# Patient Record
Sex: Female | Born: 1935 | Race: White | Hispanic: No | Smoking: Never smoker
Health system: Southern US, Community
[De-identification: ages and names within clinical notes are randomized; demographics above are authoritative.]

## PROBLEM LIST (undated history)

## (undated) DIAGNOSIS — I10 Essential (primary) hypertension: Secondary | ICD-10-CM

## (undated) DIAGNOSIS — H409 Unspecified glaucoma: Secondary | ICD-10-CM

## (undated) DIAGNOSIS — I6522 Occlusion and stenosis of left carotid artery: Secondary | ICD-10-CM

## (undated) DIAGNOSIS — M1990 Unspecified osteoarthritis, unspecified site: Secondary | ICD-10-CM

## (undated) DIAGNOSIS — I2510 Atherosclerotic heart disease of native coronary artery without angina pectoris: Secondary | ICD-10-CM

## (undated) DIAGNOSIS — I38 Endocarditis, valve unspecified: Secondary | ICD-10-CM

## (undated) DIAGNOSIS — E785 Hyperlipidemia, unspecified: Secondary | ICD-10-CM

## (undated) DIAGNOSIS — M79606 Pain in leg, unspecified: Secondary | ICD-10-CM

## (undated) HISTORY — DX: Unspecified glaucoma: H40.9

## (undated) HISTORY — DX: Endocarditis, valve unspecified: I38

## (undated) HISTORY — DX: Hyperlipidemia, unspecified: E78.5

## (undated) HISTORY — PX: OTHER SURGICAL HISTORY: SHX169

## (undated) HISTORY — DX: Pain in leg, unspecified: M79.606

## (undated) HISTORY — DX: Essential (primary) hypertension: I10

## (undated) HISTORY — PX: ANOMALOUS PULMONARY VENOUS RETURN REPAIR, TOTAL: SHX1156

## (undated) HISTORY — DX: Occlusion and stenosis of left carotid artery: I65.22

## (undated) HISTORY — PX: APPENDECTOMY: SHX54

## (undated) HISTORY — PX: FOOT SURGERY: SHX648

## (undated) HISTORY — DX: Unspecified osteoarthritis, unspecified site: M19.90

## (undated) HISTORY — DX: Atherosclerotic heart disease of native coronary artery without angina pectoris: I25.10

---

## 1999-03-01 ENCOUNTER — Encounter: Admission: RE | Admit: 1999-03-01 | Discharge: 1999-04-16 | Payer: Self-pay | Admitting: Neurosurgery

## 1999-05-23 ENCOUNTER — Other Ambulatory Visit: Admission: RE | Admit: 1999-05-23 | Discharge: 1999-05-23 | Payer: Self-pay | Admitting: Internal Medicine

## 2000-05-16 ENCOUNTER — Encounter: Payer: Self-pay | Admitting: Internal Medicine

## 2000-07-22 ENCOUNTER — Encounter: Admission: RE | Admit: 2000-07-22 | Discharge: 2000-09-22 | Payer: Self-pay | Admitting: Orthopedic Surgery

## 2004-02-07 ENCOUNTER — Ambulatory Visit: Payer: Self-pay | Admitting: Internal Medicine

## 2004-03-15 ENCOUNTER — Ambulatory Visit: Payer: Self-pay | Admitting: Internal Medicine

## 2004-04-06 ENCOUNTER — Ambulatory Visit: Payer: Self-pay | Admitting: Internal Medicine

## 2004-06-27 ENCOUNTER — Ambulatory Visit: Payer: Self-pay | Admitting: Internal Medicine

## 2004-07-31 ENCOUNTER — Ambulatory Visit: Payer: Self-pay | Admitting: Internal Medicine

## 2004-11-06 ENCOUNTER — Ambulatory Visit: Payer: Self-pay | Admitting: Internal Medicine

## 2004-11-09 ENCOUNTER — Ambulatory Visit: Payer: Self-pay | Admitting: Cardiology

## 2004-11-15 ENCOUNTER — Encounter: Admission: RE | Admit: 2004-11-15 | Discharge: 2004-11-15 | Payer: Self-pay | Admitting: Internal Medicine

## 2005-01-16 ENCOUNTER — Ambulatory Visit: Payer: Self-pay | Admitting: Internal Medicine

## 2005-04-19 ENCOUNTER — Ambulatory Visit: Payer: Self-pay | Admitting: Internal Medicine

## 2005-06-12 ENCOUNTER — Ambulatory Visit: Payer: Self-pay | Admitting: Internal Medicine

## 2005-07-15 ENCOUNTER — Ambulatory Visit: Payer: Self-pay | Admitting: Internal Medicine

## 2005-10-11 ENCOUNTER — Ambulatory Visit: Payer: Self-pay | Admitting: Internal Medicine

## 2005-12-23 ENCOUNTER — Ambulatory Visit: Payer: Self-pay | Admitting: Internal Medicine

## 2005-12-26 ENCOUNTER — Ambulatory Visit: Payer: Self-pay

## 2005-12-31 ENCOUNTER — Ambulatory Visit: Payer: Self-pay | Admitting: Internal Medicine

## 2006-01-31 ENCOUNTER — Ambulatory Visit: Payer: Self-pay | Admitting: Internal Medicine

## 2006-03-07 ENCOUNTER — Ambulatory Visit: Payer: Self-pay | Admitting: Internal Medicine

## 2006-04-18 ENCOUNTER — Ambulatory Visit: Payer: Self-pay | Admitting: Internal Medicine

## 2006-06-17 ENCOUNTER — Ambulatory Visit: Payer: Self-pay | Admitting: Internal Medicine

## 2006-06-18 LAB — CONVERTED CEMR LAB
BUN: 30 mg/dL — ABNORMAL HIGH (ref 6–23)
CO2: 30 meq/L (ref 19–32)
Calcium: 9.3 mg/dL (ref 8.4–10.5)
Chloride: 108 meq/L (ref 96–112)
Creatinine, Ser: 1.3 mg/dL — ABNORMAL HIGH (ref 0.4–1.2)
GFR calc Af Amer: 51 mL/min
GFR calc non Af Amer: 42 mL/min
Glucose, Bld: 115 mg/dL — ABNORMAL HIGH (ref 70–99)
Hgb A1c MFr Bld: 6.2 % — ABNORMAL HIGH (ref 4.6–6.0)
Potassium: 4.2 meq/L (ref 3.5–5.1)
Sodium: 144 meq/L (ref 135–145)

## 2006-08-21 ENCOUNTER — Ambulatory Visit: Payer: Self-pay | Admitting: *Deleted

## 2006-09-12 ENCOUNTER — Ambulatory Visit: Payer: Self-pay | Admitting: Internal Medicine

## 2006-10-02 DIAGNOSIS — M810 Age-related osteoporosis without current pathological fracture: Secondary | ICD-10-CM | POA: Insufficient documentation

## 2006-10-02 DIAGNOSIS — E785 Hyperlipidemia, unspecified: Secondary | ICD-10-CM | POA: Insufficient documentation

## 2006-10-02 DIAGNOSIS — I10 Essential (primary) hypertension: Secondary | ICD-10-CM | POA: Insufficient documentation

## 2006-10-24 ENCOUNTER — Ambulatory Visit: Payer: Self-pay | Admitting: Internal Medicine

## 2006-10-24 DIAGNOSIS — E1169 Type 2 diabetes mellitus with other specified complication: Secondary | ICD-10-CM | POA: Insufficient documentation

## 2006-10-24 DIAGNOSIS — E1129 Type 2 diabetes mellitus with other diabetic kidney complication: Secondary | ICD-10-CM | POA: Insufficient documentation

## 2006-10-24 LAB — CONVERTED CEMR LAB
BUN: 30 mg/dL — ABNORMAL HIGH (ref 6–23)
Calcium: 9.7 mg/dL (ref 8.4–10.5)
Creatinine, Ser: 1.2 mg/dL (ref 0.4–1.2)
GFR calc Af Amer: 55 mL/min
Hgb A1c MFr Bld: 6.2 % — ABNORMAL HIGH (ref 4.6–6.0)
Potassium: 5.2 meq/L — ABNORMAL HIGH (ref 3.5–5.1)

## 2006-10-30 ENCOUNTER — Telehealth: Payer: Self-pay | Admitting: *Deleted

## 2006-10-30 ENCOUNTER — Ambulatory Visit: Payer: Self-pay | Admitting: Internal Medicine

## 2006-10-30 LAB — CONVERTED CEMR LAB: Potassium: 5.8 meq/L — ABNORMAL HIGH (ref 3.5–5.1)

## 2006-11-05 ENCOUNTER — Telehealth: Payer: Self-pay | Admitting: *Deleted

## 2006-12-03 ENCOUNTER — Encounter: Payer: Self-pay | Admitting: Internal Medicine

## 2006-12-29 ENCOUNTER — Ambulatory Visit: Payer: Self-pay | Admitting: Internal Medicine

## 2006-12-29 ENCOUNTER — Telehealth: Payer: Self-pay | Admitting: Internal Medicine

## 2006-12-31 ENCOUNTER — Telehealth (INDEPENDENT_AMBULATORY_CARE_PROVIDER_SITE_OTHER): Payer: Self-pay | Admitting: *Deleted

## 2007-01-01 ENCOUNTER — Ambulatory Visit: Payer: Self-pay | Admitting: Internal Medicine

## 2007-01-01 DIAGNOSIS — M12559 Traumatic arthropathy, unspecified hip: Secondary | ICD-10-CM | POA: Insufficient documentation

## 2007-01-26 ENCOUNTER — Ambulatory Visit: Payer: Self-pay | Admitting: Internal Medicine

## 2007-01-26 LAB — CONVERTED CEMR LAB: Cholesterol: 212 mg/dL (ref 0–200)

## 2007-02-02 ENCOUNTER — Ambulatory Visit: Payer: Self-pay | Admitting: Internal Medicine

## 2007-02-02 DIAGNOSIS — M1990 Unspecified osteoarthritis, unspecified site: Secondary | ICD-10-CM | POA: Insufficient documentation

## 2007-02-02 LAB — CONVERTED CEMR LAB
Cholesterol, target level: 200 mg/dL
Hgb A1c MFr Bld: 6.2 % — ABNORMAL HIGH (ref 4.6–6.0)
LDL Goal: 100 mg/dL

## 2007-02-09 ENCOUNTER — Telehealth: Payer: Self-pay | Admitting: Internal Medicine

## 2007-02-12 ENCOUNTER — Ambulatory Visit: Payer: Self-pay | Admitting: *Deleted

## 2007-05-18 ENCOUNTER — Ambulatory Visit: Payer: Self-pay | Admitting: Internal Medicine

## 2007-05-18 LAB — CONVERTED CEMR LAB
BUN: 30 mg/dL — ABNORMAL HIGH (ref 6–23)
Calcium: 9.7 mg/dL (ref 8.4–10.5)
GFR calc Af Amer: 55 mL/min
GFR calc non Af Amer: 46 mL/min
Hgb A1c MFr Bld: 6.2 % — ABNORMAL HIGH (ref 4.6–6.0)
Potassium: 5.5 meq/L — ABNORMAL HIGH (ref 3.5–5.1)

## 2007-07-14 ENCOUNTER — Encounter: Payer: Self-pay | Admitting: Internal Medicine

## 2007-08-13 ENCOUNTER — Ambulatory Visit: Payer: Self-pay | Admitting: *Deleted

## 2007-08-17 ENCOUNTER — Ambulatory Visit: Payer: Self-pay | Admitting: Internal Medicine

## 2007-10-19 ENCOUNTER — Telehealth: Payer: Self-pay | Admitting: Internal Medicine

## 2007-10-19 DIAGNOSIS — N644 Mastodynia: Secondary | ICD-10-CM | POA: Insufficient documentation

## 2007-10-23 ENCOUNTER — Encounter: Payer: Self-pay | Admitting: Internal Medicine

## 2007-11-16 ENCOUNTER — Ambulatory Visit: Payer: Self-pay | Admitting: Internal Medicine

## 2007-11-16 DIAGNOSIS — M25511 Pain in right shoulder: Secondary | ICD-10-CM | POA: Insufficient documentation

## 2007-11-16 DIAGNOSIS — M25512 Pain in left shoulder: Secondary | ICD-10-CM

## 2007-11-16 LAB — CONVERTED CEMR LAB
Calcium: 9.4 mg/dL (ref 8.4–10.5)
GFR calc Af Amer: 61 mL/min
GFR calc non Af Amer: 51 mL/min
Hgb A1c MFr Bld: 6.3 % — ABNORMAL HIGH (ref 4.6–6.0)
Sodium: 143 meq/L (ref 135–145)

## 2007-11-22 ENCOUNTER — Encounter: Admission: RE | Admit: 2007-11-22 | Discharge: 2007-11-22 | Payer: Self-pay | Admitting: Internal Medicine

## 2007-11-25 ENCOUNTER — Telehealth: Payer: Self-pay | Admitting: Internal Medicine

## 2007-11-25 DIAGNOSIS — M67919 Unspecified disorder of synovium and tendon, unspecified shoulder: Secondary | ICD-10-CM | POA: Insufficient documentation

## 2007-11-25 DIAGNOSIS — M719 Bursopathy, unspecified: Secondary | ICD-10-CM

## 2007-12-04 ENCOUNTER — Encounter: Payer: Self-pay | Admitting: Internal Medicine

## 2008-01-02 ENCOUNTER — Ambulatory Visit: Payer: Self-pay | Admitting: Internal Medicine

## 2008-01-08 ENCOUNTER — Ambulatory Visit: Payer: Self-pay | Admitting: Internal Medicine

## 2008-01-08 DIAGNOSIS — M6740 Ganglion, unspecified site: Secondary | ICD-10-CM | POA: Insufficient documentation

## 2008-01-25 ENCOUNTER — Telehealth: Payer: Self-pay | Admitting: Internal Medicine

## 2008-02-03 ENCOUNTER — Encounter: Payer: Self-pay | Admitting: Internal Medicine

## 2008-02-03 ENCOUNTER — Ambulatory Visit: Payer: Self-pay | Admitting: Internal Medicine

## 2008-02-10 ENCOUNTER — Encounter: Payer: Self-pay | Admitting: Internal Medicine

## 2008-02-18 ENCOUNTER — Ambulatory Visit: Payer: Self-pay | Admitting: *Deleted

## 2008-03-17 ENCOUNTER — Telehealth: Payer: Self-pay | Admitting: Internal Medicine

## 2008-04-06 ENCOUNTER — Ambulatory Visit: Payer: Self-pay | Admitting: Internal Medicine

## 2008-04-06 LAB — CONVERTED CEMR LAB
AST: 22 units/L (ref 0–37)
Albumin: 3.5 g/dL (ref 3.5–5.2)
Alkaline Phosphatase: 65 units/L (ref 39–117)
BUN: 34 mg/dL — ABNORMAL HIGH (ref 6–23)
Chloride: 107 meq/L (ref 96–112)
Eosinophils Absolute: 0.1 10*3/uL (ref 0.0–0.7)
Eosinophils Relative: 0.9 % (ref 0.0–5.0)
GFR calc non Af Amer: 46 mL/min
Lymphocytes Relative: 16.4 % (ref 12.0–46.0)
MCV: 95 fL (ref 78.0–100.0)
Neutrophils Relative %: 73.1 % (ref 43.0–77.0)
Platelets: 199 10*3/uL (ref 150–400)
Potassium: 4.8 meq/L (ref 3.5–5.1)
Total Bilirubin: 0.8 mg/dL (ref 0.3–1.2)
WBC: 7.8 10*3/uL (ref 4.5–10.5)

## 2008-04-07 ENCOUNTER — Ambulatory Visit: Payer: Self-pay | Admitting: Internal Medicine

## 2008-06-12 ENCOUNTER — Emergency Department (HOSPITAL_COMMUNITY): Admission: EM | Admit: 2008-06-12 | Discharge: 2008-06-12 | Payer: Self-pay | Admitting: Oncology

## 2008-06-13 ENCOUNTER — Telehealth: Payer: Self-pay | Admitting: Internal Medicine

## 2008-06-13 DIAGNOSIS — S62109A Fracture of unspecified carpal bone, unspecified wrist, initial encounter for closed fracture: Secondary | ICD-10-CM | POA: Insufficient documentation

## 2008-06-14 ENCOUNTER — Encounter: Payer: Self-pay | Admitting: Internal Medicine

## 2008-06-17 ENCOUNTER — Telehealth: Payer: Self-pay | Admitting: Internal Medicine

## 2008-06-22 ENCOUNTER — Telehealth: Payer: Self-pay | Admitting: Internal Medicine

## 2008-07-06 ENCOUNTER — Ambulatory Visit: Payer: Self-pay | Admitting: Internal Medicine

## 2008-07-06 LAB — CONVERTED CEMR LAB
BUN: 27 mg/dL — ABNORMAL HIGH (ref 6–23)
CO2: 30 meq/L (ref 19–32)
Chloride: 109 meq/L (ref 96–112)
Creatinine, Ser: 1.2 mg/dL (ref 0.4–1.2)

## 2008-08-18 ENCOUNTER — Ambulatory Visit: Payer: Self-pay | Admitting: *Deleted

## 2008-09-05 ENCOUNTER — Ambulatory Visit: Payer: Self-pay | Admitting: Internal Medicine

## 2008-09-05 LAB — CONVERTED CEMR LAB
CO2: 30 meq/L (ref 19–32)
Chloride: 108 meq/L (ref 96–112)
Glucose, Bld: 83 mg/dL (ref 70–99)
Potassium: 4.8 meq/L (ref 3.5–5.1)
Sodium: 143 meq/L (ref 135–145)

## 2008-12-06 ENCOUNTER — Encounter: Payer: Self-pay | Admitting: Internal Medicine

## 2008-12-16 ENCOUNTER — Ambulatory Visit: Payer: Self-pay | Admitting: Internal Medicine

## 2008-12-16 DIAGNOSIS — R0789 Other chest pain: Secondary | ICD-10-CM | POA: Insufficient documentation

## 2008-12-22 ENCOUNTER — Telehealth (INDEPENDENT_AMBULATORY_CARE_PROVIDER_SITE_OTHER): Payer: Self-pay | Admitting: *Deleted

## 2008-12-26 ENCOUNTER — Ambulatory Visit: Payer: Self-pay

## 2008-12-26 ENCOUNTER — Encounter: Payer: Self-pay | Admitting: Cardiology

## 2009-01-06 ENCOUNTER — Ambulatory Visit: Payer: Self-pay | Admitting: Internal Medicine

## 2009-01-06 DIAGNOSIS — H409 Unspecified glaucoma: Secondary | ICD-10-CM | POA: Insufficient documentation

## 2009-02-28 ENCOUNTER — Ambulatory Visit: Payer: Self-pay | Admitting: Vascular Surgery

## 2009-04-07 ENCOUNTER — Ambulatory Visit: Payer: Self-pay | Admitting: Internal Medicine

## 2009-04-07 LAB — CONVERTED CEMR LAB
Albumin: 3.5 g/dL (ref 3.5–5.2)
HDL: 36.9 mg/dL — ABNORMAL LOW (ref >39.00)
Hgb A1c MFr Bld: 6 % (ref 4.6–6.5)
LDL Cholesterol: 128 mg/dL — ABNORMAL HIGH (ref 0–99)
Total CHOL/HDL Ratio: 5
Triglycerides: 118 mg/dL (ref 0.0–149.0)

## 2009-04-14 ENCOUNTER — Ambulatory Visit: Payer: Self-pay | Admitting: Internal Medicine

## 2009-07-14 ENCOUNTER — Ambulatory Visit: Payer: Self-pay | Admitting: Internal Medicine

## 2009-07-14 LAB — CONVERTED CEMR LAB
Basophils Absolute: 0 10*3/uL (ref 0.0–0.1)
Calcium: 9.2 mg/dL (ref 8.4–10.5)
Chloride: 106 meq/L (ref 96–112)
Creatinine, Ser: 1.2 mg/dL (ref 0.4–1.2)
Direct LDL: 112.2 mg/dL
Eosinophils Absolute: 0.2 10*3/uL (ref 0.0–0.7)
GFR calc non Af Amer: 45.48 mL/min (ref >60)
HDL: 35 mg/dL — ABNORMAL LOW (ref >39.00)
Hemoglobin: 10.9 g/dL — ABNORMAL LOW (ref 12.0–15.0)
Lymphocytes Relative: 22.1 % (ref 12.0–46.0)
MCHC: 33.9 g/dL (ref 30.0–36.0)
Monocytes Absolute: 0.5 10*3/uL (ref 0.1–1.0)
Neutro Abs: 4.1 10*3/uL (ref 1.4–7.7)
RDW: 13.4 % (ref 11.5–14.6)

## 2009-08-03 ENCOUNTER — Telehealth: Payer: Self-pay | Admitting: Internal Medicine

## 2009-08-04 ENCOUNTER — Ambulatory Visit: Payer: Self-pay | Admitting: Internal Medicine

## 2009-08-04 ENCOUNTER — Telehealth: Payer: Self-pay | Admitting: Internal Medicine

## 2009-11-15 ENCOUNTER — Ambulatory Visit: Payer: Self-pay | Admitting: Internal Medicine

## 2009-11-15 LAB — CONVERTED CEMR LAB
Direct LDL: 172.9 mg/dL
HDL: 38.5 mg/dL — ABNORMAL LOW (ref >39.00)
Hgb A1c MFr Bld: 6.1 % (ref 4.6–6.5)

## 2010-01-17 ENCOUNTER — Ambulatory Visit: Payer: Self-pay | Admitting: Internal Medicine

## 2010-01-17 LAB — CONVERTED CEMR LAB
Basophils Absolute: 0 10*3/uL (ref 0.0–0.1)
Basophils Relative: 0.6 % (ref 0.0–3.0)
Eosinophils Absolute: 0.1 10*3/uL (ref 0.0–0.7)
HCT: 31.6 % — ABNORMAL LOW (ref 36.0–46.0)
Hemoglobin: 10.8 g/dL — ABNORMAL LOW (ref 12.0–15.0)
Lymphs Abs: 1.2 10*3/uL (ref 0.7–4.0)
MCHC: 34.1 g/dL (ref 30.0–36.0)
MCV: 91.3 fL (ref 78.0–100.0)
Monocytes Absolute: 0.5 10*3/uL (ref 0.1–1.0)
Neutro Abs: 4.1 10*3/uL (ref 1.4–7.7)
RDW: 13.3 % (ref 11.5–14.6)

## 2010-01-23 ENCOUNTER — Telehealth: Payer: Self-pay | Admitting: Internal Medicine

## 2010-03-21 ENCOUNTER — Ambulatory Visit: Payer: Self-pay | Admitting: Vascular Surgery

## 2010-04-16 ENCOUNTER — Ambulatory Visit
Admission: RE | Admit: 2010-04-16 | Discharge: 2010-04-16 | Payer: Self-pay | Source: Home / Self Care | Attending: Internal Medicine | Admitting: Internal Medicine

## 2010-04-16 ENCOUNTER — Other Ambulatory Visit: Payer: Self-pay | Admitting: Internal Medicine

## 2010-04-16 DIAGNOSIS — E1142 Type 2 diabetes mellitus with diabetic polyneuropathy: Secondary | ICD-10-CM | POA: Insufficient documentation

## 2010-04-16 LAB — BASIC METABOLIC PANEL
BUN: 29 mg/dL — ABNORMAL HIGH (ref 6–23)
CO2: 27 mEq/L (ref 19–32)
Calcium: 9 mg/dL (ref 8.4–10.5)
Chloride: 103 mEq/L (ref 96–112)
Creatinine, Ser: 1.2 mg/dL (ref 0.4–1.2)
GFR: 46.28 mL/min — ABNORMAL LOW (ref >60.00)
Glucose, Bld: 68 mg/dL — ABNORMAL LOW (ref 70–99)
Potassium: 4.3 mEq/L (ref 3.5–5.1)
Sodium: 139 mEq/L (ref 135–145)

## 2010-04-16 LAB — B12 AND FOLATE PANEL
Folate: >24.8 ng/mL (ref >5.9)
Vitamin B-12: 443 pg/mL (ref 211–911)

## 2010-04-16 LAB — HEMOGLOBIN A1C: Hgb A1c MFr Bld: 6.2 % (ref 4.6–6.5)

## 2010-04-22 ENCOUNTER — Encounter: Payer: Self-pay | Admitting: Internal Medicine

## 2010-04-23 ENCOUNTER — Telehealth: Payer: Self-pay | Admitting: Internal Medicine

## 2010-05-03 NOTE — Assessment & Plan Note (Signed)
Summary: Follow-up/cb pt rsc/njr   Vital Signs:  Patient profile:   77 year old female Height:      60 inches Weight:      162 pounds BMI:     31.75 Temp:     98.2 degrees F oral Pulse rate:   76 / minute Resp:     16 per minute BP sitting:   136 / 70  (left arm)  Vitals Entered By: Allyne Gee, LPN (January 16, [id number] 10:33 AM) CC: roa- fasting bs was 115 this am, Lipid Management, Hypertension Management Is Patient Diabetic? Yes Did you bring your meter with you today? No   Primary Care Provider:  Ricard Dillon MD  CC:  roa- fasting bs was 115 this am, Lipid Management, and Hypertension Management.  History of Present Illness: the dull tooth ache pain in legs is worse in bed and not complicated but walking.She is off the statins and the leg pain has lessen some ( she was on a pulse therapy only) She is off the zetia... The pts blood glucoses are unning in the 150 range Her LDL was 170+ Has been taking advil for the leg pain  Hypertension History:      She denies headache, chest pain, palpitations, dyspnea with exertion, orthopnea, PND, peripheral edema, visual symptoms, neurologic problems, syncope, and side effects from treatment.        Positive major cardiovascular risk factors include female age 3 years old or older, diabetes, hyperlipidemia, and hypertension.  Negative major cardiovascular risk factors include non-tobacco-user status.        Further assessment for target organ damage reveals no history of ASHD, stroke/TIA, or peripheral vascular disease.    Lipid Management History:      Positive NCEP/ATP III risk factors include female age 72 years old or older, diabetes, HDL cholesterol less than 40, and hypertension.  Negative NCEP/ATP III risk factors include no history of early menopause without estrogen hormone replacement, non-tobacco-user status, no ASHD (atherosclerotic heart disease), no prior stroke/TIA, no peripheral vascular disease, and no history of  aortic aneurysm.      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) 12)  Diabetes Mellitus, Type II  (ICD-250.00) 13)  Osteoporosis  (ICD-733.00) 14)  Hypertension  (ICD-401.9) 15)  Hyperlipidemia  (ICD-272.4)  Current Problems (verified): 1)  Acute Bronchitis  (ICD-466.0) 2)  Glaucoma  (ICD-365.9) 3)  Chest Pain, Atypical  (ICD-786.59) 4)  Fracture, Wrist, Right  (ICD-814.00) 5)  Ganglion Cyst, Joint  (ICD-727.41) 6)  Rotator Cuff Injury, Right Shoulder  (ICD-726.10) 7)  Shoulder Pain, Right  (ICD-719.41) 8)  Shoulder Pain, Right  (ICD-719.41) 9)  Breast Pain  (ICD-611.71) 10)  Osteoarthritis  (ICD-715.90) 11)  Arthritis, Traumatic, 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 One By Mouth Bid 2)  Multivitamins   Caps (Multiple Vitamin) .... Once Daily 3)  Baby Aspirin 81 Mg  Chew (Aspirin) .... Once Daily 4)  Exforge Hct 5-160-12.5 Mg Tabs (Amlodipine-Valsartan-Hctz) .[name].. 1 Once Daily 5)  Glucophage Xr 500 Mg Xr24h-Tab (Metformin Hcl) .[name].. 1 Once Daily 6)  Vitamin D3 1000 Unit Caps (Cholecalciferol) .... One By Mouth Daily 7)  Lumigan 0.03 % Soln (Bimatoprost) .[name].. 1 Drop Each At Bedtime 8)  Freestyle  Lancets  Misc (Lancets) .... Use As Directed  Current Medications (verified): 1)  Calcium 500/d 500-400 Mg-Unit Chew (Calcium-Vitamin D) .... One By Mouth Bid 2)  Multivitamins   Caps (Multiple Vitamin) .... Once Daily 3)  Baby Aspirin 81 Mg  Chew (Aspirin) .... Once Daily 4)  Exforge Hct 5-160-12.5 Mg Tabs (Amlodipine-Valsartan-Hctz) .[name].. 1 Once Daily 5)  Glucophage Xr 500 Mg Xr24h-Tab (Metformin Hcl) .[name].. 1 Once Daily 6)  Vitamin D3 1000 Unit Caps (Cholecalciferol) .... One By Mouth Daily 7)  Lumigan 0.03 % Soln (Bimatoprost) .[name].. 1 Drop Each At Bedtime 8)  Freestyle Lancets  Misc (Lancets) .... Use As Directed 9)  Gabapentin 600 Mg Tabs (Gabapentin) .... One By Mouth Q Hs  Allergies (verified): 1)  ! Effexor  Past History:  Family History: Last updated: 10/24/2006 Father Family History Lung cancer mother at 35 of age complications  Social History: Last updated: 10/24/2006 Retired Single widowed Never Smoked Alcohol use-no  Risk Factors: Exercise: yes (05/18/2007)  Risk Factors: Smoking Status: never (04/16/2010)  Past medical, surgical, family and social histories (including risk factors) reviewed, and no changes noted (except as noted below).  Past Medical History: Reviewed history from 04/06/2008 and no changes required. Hyperlipidemia Hypertension Osteoporosis bone density in 2007 PMR Diabetes mellitus, type II Osteoarthritis left carotid stenosis  50-60%  stable  Past Surgical History: Reviewed history from 10/24/2006 and no changes required. Caesarean section foot surgery [name] Appendectomy  Family History: Reviewed history from 10/24/2006 and no changes required. Father Family History Lung cancer mother at 36 of age complications  Social History: Reviewed history from 10/24/2006 and no changes required. Retired Single widowed Never Smoked Alcohol use-no  Review of Systems       The patient complains of peripheral edema and difficulty  walking.  The patient denies anorexia, fever, weight loss, weight gain, vision loss, decreased hearing, hoarseness, chest pain, syncope, dyspnea on exertion, prolonged cough, headaches, hemoptysis, abdominal pain, melena, hematochezia, severe indigestion/heartburn, hematuria, incontinence, genital sores, muscle weakness, suspicious skin lesions, transient blindness, depression, unusual weight change, abnormal bleeding, enlarged lymph nodes, angioedema, and breast masses.    Physical Exam  General:  obese.  well-developed.   Head:  Normocephalic and atraumatic without obvious abnormalities. No apparent alopecia or balding. Eyes:  vision grossly intact, pupils equal, and pupils round.   Ears:  R ear normal and L ear normal.   Nose:  External nasal examination shows no deformity or inflammation. Nasal mucosa are pink and moist without lesions or exudates. Neck:  supple.  no masses.   Lungs:  normal respiratory effort, no dullness, and no wheezes.  peak flow was 300cc Heart:  normal rate, regular rhythm, and Grade  2 /6 systolic ejection murmur.   Abdomen:  soft and epigastric tenderness.    Diabetes Management Exam:    Foot Exam (with socks and/or shoes not present):       Sensory-Pinprick/Light touch:          Left medial foot (L-4):  diminished          Left dorsal foot (L-5): diminished          Left lateral foot (S-1): diminished          Right medial foot (L-4): diminished          Right dorsal foot (L-5): diminished          Right lateral foot (S-1): diminished       Sensory-Monofilament:          Left foot: diminished          Right foot: diminished       Inspection:          Left foot: normal          Right foot: normal       Nails:          Left foot: normal          Right foot: normal   Impression & Recommendations:  Problem # 1:  DIABETES MELLITUS, TYPE II (ICD-250.00) Assessment Deteriorated  Her updated medication list for this problem includes:    Baby Aspirin 81 Mg  Chew (Aspirin) ..... Once daily    Exforge Hct 5-160-12.5 Mg Tabs (Amlodipine-valsartan-hctz) .[name]... 1 once daily    Glucophage Xr 500 Mg Xr24h-tab (Metformin hcl) .[name]... 1 once daily  Orders: TLB-BMP (Basic Metabolic Panel-BMET) (99991111) TLB-A1C / Hgb A1C (Glycohemoglobin) (83036-A1C)  Problem # 2:  POLYNEUROPATHY IN DIABETES (ICD-357.2) Assessment: Deteriorated  b12 shot today trial of gabepentum  600  Orders: TLB-B12 + Folate Pnl [id number]) Venipuncture [id number])  Problem # 3:  OSTEOARTHRITIS (ICD-715.90) Assessment: Unchanged  Her updated medication list for this problem includes:    Baby Aspirin 81 Mg Chew (Aspirin) ..... Once daily  Discussed use of medications, application of heat or cold, and exercises.   Problem # 4:  HYPERTENSION (ICD-401.9) Assessment: Unchanged  Her updated medication list for this problem includes:    Exforge Hct 5-160-12.5 Mg Tabs (Amlodipine-valsartan-hctz) .[name]... 1 once daily  BP today: 136/70 Prior BP: 120/74 (01/17/2010)  Prior 10 Yr Risk Heart Disease: 24 % (07/14/2009)  Labs Reviewed: K+: 4.2 (07/14/2009) Creat: : 1.2 (07/14/2009)   Chol: 234 (11/15/2009)   HDL: 38.50 (11/15/2009)   LDL: 128 (04/07/2009)   TG: 200.0 (11/15/2009)  Complete Medication List: 1)  Calcium 500/d 500-400 Mg-unit Chew (Calcium-vitamin d) .... One by mouth bid 2)  Multivitamins Caps (Multiple vitamin) .... Once daily 3)  Baby Aspirin 81 Mg Chew (Aspirin) .... Once daily 4)  Exforge Hct 5-160-12.5 Mg Tabs (Amlodipine-valsartan-hctz) .[name].. 1 once daily 5)  Glucophage Xr 500 Mg Xr24h-tab (Metformin hcl) .[name].. 1 once daily 6)  Vitamin D3 1000 Unit Caps (Cholecalciferol) .... One by mouth daily 7)  Lumigan 0.03 % Soln (Bimatoprost) .[name].. 1 drop each at bedtime 8)  Freestyle Lancets Misc (Lancets) .... Use as directed 9)  Gabapentin 600 Mg Tabs (Gabapentin) .... One by mouth q hs  Hypertension Assessment/Plan:      The patient's hypertensive risk  group is category C: Target organ damage and/or diabetes.  Her calculated 10 year risk of coronary heart disease is 24 %.  Today's blood pressure is 136/70.  Her blood pressure goal is < 130/80.  Lipid Assessment/Plan:      Based on NCEP/ATP III, the patient's risk factor category is "history of diabetes".  The patient's lipid goals are as follows: Total cholesterol goal is 200; LDL cholesterol goal is 100; HDL cholesterol goal is 40; Triglyceride  goal is 150.  Her LDL cholesterol goal has not been met.  Secondary causes for hyperlipidemia have been ruled out.  She has been counseled on adjunctive measures for lowering her cholesterol and has been provided with dietary instructions.    Patient Instructions: 1)  5pm take  horizant  one with water 2)  Please schedule a follow-up appointment in 2 months. 3)  Then will adress the lipids if we have the leg pain improved   Orders Added: 1)  TLB-B12 + Folate Pnl [[id number]-B12/FOL] 2)  Venipuncture [36415] 3)  TLB-BMP (Basic Metabolic Panel-BMET) [id number] 4)  TLB-A1C / Hgb A1C (Glycohemoglobin) [83036-A1C] 5)  Est. Patient Level IV [id number]  Appended Document: Orders Update    Clinical Lists Changes  Orders: Added new Service order of Specimen Handling (99000) - Signed      Appended Document: Orders Update    Clinical Lists Changes  Orders: Added new Service order of Vit B12 1000 mcg [id number]) - Signed Added new Service order of Admin of Therapeutic Inj  intramuscular or subcutaneous [id number]) - Signed       Medication Administration  Injection # 1:    Medication: Vit B12 1000 mcg    Diagnosis: POLYNEUROPATHY IN DIABETES (ICD-357.2)    Route: IM    Site: L deltoid    Exp Date: 8/13    Lot #: [id number]    Mfr: [employer]    Patient tolerated injection without complications    Given by: Lars Mage C.Millner MA Student   Orders Added: 1)  Vit B12 1000 mcg [J3420] 2)  Admin of Therapeutic Inj  intramuscular or  subcutaneous [id number]

## 2010-05-03 NOTE — Assessment & Plan Note (Signed)
Summary: 2 month f/u//alp   Vital Signs:  Patient profile:   77 year old female Weight:      153 pounds Temp:     97.8 degrees F oral BP sitting:   120 / 74  (left arm) Cuff size:   regular  Vitals Entered By: Cay Schillings LPN (October 19, 624THL 1:29 PM) CC: 2 mos rov - doing ok ,c/o muscle pain    fbs 125 Is Patient Diabetic? Yes Did you bring your meter with you today? No Flu Vaccine Consent Questions     Do you have a history of severe allergic reactions to this vaccine? no    Any prior history of allergic reactions to egg and/or gelatin? no    Do you have a sensitivity to the preservative Thimersol? no    Do you have a past history of Guillan-Barre Syndrome? no    Do you currently have an acute febrile illness? no    Have you ever had a severe reaction to latex? no    Vaccine information given and explained to patient? yes    Are you currently pregnant? no    Lot Number:[id number]   Exp Date:09/29/2010   Site Given  Left Deltoid IM   Primary Care Makailee Nudelman:  Ricard Dillon MD  CC:  2 mos rov - doing ok  and c/o muscle pain    fbs 125.  History of Present Illness: FOLLOW UP OF THE LIPIDS  Hyperlipidemia Follow-Up      This is an 77 year old woman who presents for Hyperlipidemia follow-up.  The patient denies muscle aches, GI upset, abdominal pain, flushing, itching, constipation, diarrhea, and fatigue.  The patient denies the following symptoms: chest pain/pressure, exercise intolerance, dypsnea, palpitations, syncope, and pedal edema.  Compliance with medications (by patient report) has been intermittent.  Dietary compliance has been poor.  The patient reports exercising occasionally.  Adjunctive measures currently used by the patient include ASA and fish oil supplements.    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 One By Mouth Bid 2)  Multivitamins   Caps (Multiple Vitamin) .... Once Daily 3)  Baby Aspirin 81 Mg  Chew (Aspirin) .... Once Daily 4)  Exforge Hct 5-160-12.5 Mg Tabs (Amlodipine-Valsartan-Hctz) .[name].. 1 Once Daily 5)  Glucophage Xr 500 Mg Xr24h-Tab (Metformin Hcl) .[name].. 1 Once Daily 6)  Vitamin D3 1000 Unit Caps (Cholecalciferol) .... One By Mouth Daily 7)  Lumigan 0.03 % Soln (Bimatoprost) .[name].. 1 Drop Each At Bedtime 8)  Freestyle Lancets  Misc (Lancets) .... Use As Directed  Current Medications (verified): 1)  Calcium 500/d 500-400 Mg-Unit Chew (Calcium-Vitamin D) .... One By Mouth Bid 2)  Multivitamins   Caps (Multiple Vitamin) .... Once  Daily 3)  Baby Aspirin 81 Mg  Chew (Aspirin) .... Once Daily 4)  Exforge Hct 5-160-12.5 Mg Tabs (Amlodipine-Valsartan-Hctz) .[name].. 1 Once Daily 5)  Glucophage Xr 500 Mg Xr24h-Tab (Metformin Hcl) .[name].. 1 Once Daily 6)  Vitamin D3 1000 Unit Caps (Cholecalciferol) .... One By Mouth Daily 7)  Lumigan 0.03 % Soln (Bimatoprost) .[name].. 1 Drop Each At Bedtime 8)  Freestyle Lancets  Misc (Lancets) .... Use As Directed  Allergies: 1)  ! Effexor  Past History:  Family History: Last updated: 10/24/2006 Father Family History Lung cancer mother at 31 of age complications  Social History: Last updated:  10/24/2006 Retired Single widowed Never Smoked Alcohol use-no  Risk Factors: Exercise: yes (05/18/2007)  Risk Factors: Smoking Status: never (01/17/2010)  Past medical, surgical, family and social histories (including risk factors) reviewed, and no changes noted (except as noted below).  Past Medical History: Reviewed history from 04/06/2008 and no changes required. Hyperlipidemia Hypertension Osteoporosis bone density in 2007 PMR Diabetes mellitus, type II Osteoarthritis left carotid stenosis  50-60%  stable  Past Surgical History: Reviewed history from 10/24/2006 and no changes required. Caesarean section foot surgery [name] Appendectomy  Family History: Reviewed history from 10/24/2006 and no changes required. Father Family History Lung cancer mother at 57 of age complications  Social History: Reviewed history from 10/24/2006 and no changes required. Retired Single widowed Never Smoked Alcohol use-no  Review of Systems  The patient denies anorexia, fever, weight loss, weight gain, vision loss, decreased hearing, hoarseness, chest pain, syncope, dyspnea on exertion, peripheral edema, prolonged cough, headaches, hemoptysis, abdominal pain, melena, hematochezia, severe indigestion/heartburn, hematuria, incontinence, genital sores, muscle weakness, suspicious skin lesions, transient blindness, difficulty walking, depression, unusual weight change, abnormal bleeding, enlarged lymph nodes, angioedema, and breast masses.    Physical Exam  General:  obese.  well-developed.   Head:  Normocephalic and atraumatic without obvious abnormalities. No apparent alopecia or balding. Eyes:  vision grossly intact, pupils equal, and pupils round.   Ears:  R ear normal and L ear normal.   Nose:  External nasal examination shows no deformity or inflammation. Nasal mucosa are pink and moist without lesions or exudates. Mouth:  Oral mucosa and oropharynx without lesions or  exudates.  Teeth in good repair. Neck:  supple.  no masses.   Lungs:  normal respiratory effort, no dullness, and no wheezes.  peak flow was 300cc Heart:  normal rate, regular rhythm, and Grade  2 /6 systolic ejection murmur.     Impression & Recommendations:  Problem # 1:  HYPERTENSION (ICD-401.9)  Her updated medication list for this problem includes:    Exforge Hct 5-160-12.5 Mg Tabs (Amlodipine-valsartan-hctz) .[name]... 1 once daily  BP today: 120/74 Prior BP: 130/78 (11/15/2009)  Prior 10 Yr Risk Heart Disease: 24 % (07/14/2009)  Labs Reviewed: K+: 4.2 (07/14/2009) Creat: : 1.2 (07/14/2009)   Chol: 234 (11/15/2009)   HDL: 38.50 (11/15/2009)   LDL: 128 (04/07/2009)   TG: 200.0 (11/15/2009)  Problem # 2:  OSTEOARTHRITIS (ICD-715.90)  INCREASED ARTRITIC PAIN Her updated medication list for this problem includes:    Baby Aspirin 81 Mg Chew (Aspirin) ..... Once daily  Discussed use of medications, application of heat or cold, and exercises.   Orders: Venipuncture [id number]) TLB-CBC Platelet - w/Differential (85025-CBCD) Sedimentation Rate, non-automated [id number])  Problem # 3:  DIABETES MELLITUS, TYPE II (ICD-250.00)  Her updated medication list for this problem includes:    Baby Aspirin 81 Mg Chew (Aspirin) ..... Once daily    Exforge Hct  5-160-12.5 Mg Tabs (Amlodipine-valsartan-hctz) .[name]... 1 once daily    Glucophage Xr 500 Mg Xr24h-tab (Metformin hcl) .[name]... 1 once daily  Labs Reviewed: Creat: 1.2 (07/14/2009)     Last Eye Exam: normal (04/07/2008) Reviewed HgBA1c results: 6.1 (11/15/2009)  6.0 (04/07/2009)  Orders: TLB-A1C / Hgb A1C (Glycohemoglobin) (83036-A1C)  Problem # 4:  HYPERLIPIDEMIA (ICD-272.4)  Labs Reviewed: SGOT: 20 (04/07/2009)   SGPT: 13 (04/07/2009)  Lipid Goals: Chol Goal: 200 (02/02/2007)   HDL Goal: 40 (02/02/2007)   LDL Goal: 100 (02/02/2007)   TG Goal: 150 (02/02/2007)  Prior 10 Yr Risk Heart Disease: 24 % (07/14/2009)   HDL:38.50  (11/15/2009), 35.00 (07/14/2009)  LDL:128 (04/07/2009), DEL (01/26/2007)  Chol:234 (11/15/2009), 165 (07/14/2009)  Trig:200.0 (11/15/2009), 118.0 (04/07/2009)  Problem # 5:  ARTHRITIS, TRAUMATIC, PELVIS/THIGH (ICD-716.15) SED RATE  Complete Medication List: 1)  Calcium 500/d 500-400 Mg-unit Chew (Calcium-vitamin d) .... One by mouth bid 2)  Multivitamins Caps (Multiple vitamin) .... Once daily 3)  Baby Aspirin 81 Mg Chew (Aspirin) .... Once daily 4)  Exforge Hct 5-160-12.5 Mg Tabs (Amlodipine-valsartan-hctz) .[name].. 1 once daily 5)  Glucophage Xr 500 Mg Xr24h-tab (Metformin hcl) .[name].. 1 once daily 6)  Vitamin D3 1000 Unit Caps (Cholecalciferol) .... One by mouth daily 7)  Lumigan 0.03 % Soln (Bimatoprost) .[name].. 1 drop each at bedtime 8)  Freestyle Lancets Misc (Lancets) .... Use as directed  Other Orders: Flu Vaccine 41yrs + MEDICARE PATIENTS [id number]) Administration Flu vaccine - MCR (G0008) T-Vitamin D (25-Hydroxy) [id number])  Patient Instructions: 1)  CRESTOR 10 MG ONE NIGHT A WEEK  ( friday) 2)  Please schedule a follow-up appointment in 3 months. 3)  Hepatic Panel prior to visit, ICD-9:995.20 4)  Lipid Panel prior to visit, ICD-9:272.4   Orders Added: 1)  Flu Vaccine 42yrs + MEDICARE PATIENTS [Q2039] 2)  Administration Flu vaccine - MCR [id number] 3)  T-Vitamin D (25-Hydroxy) [id number] 4)  TLB-A1C / Hgb A1C (Glycohemoglobin) [83036-A1C] 5)  Venipuncture [id number] 6)  TLB-CBC Platelet - w/Differential [85025-CBCD] 7)  Est. Patient Level IV [id number] 8)  Sedimentation Rate, non-automated [id number]

## 2010-05-03 NOTE — Assessment & Plan Note (Signed)
Summary: 3 month rov/njr   Vital Signs:  Patient profile:   77 year old female Height:      60 inches Weight:      162 pounds Temp:     98.2 degrees F oral Pulse rate:   76 / minute Resp:     14 per minute BP sitting:   144 / 70  (left arm)  Vitals Entered By: Allyne Gee, LPN (January 14, 624THL 1:33 PM) CC: roa labs, Hypertension Management   CC:  roa labs and Hypertension Management.  History of Present Illness: Has been suffering with an  URI for about 3 weeks no fever or chill primary symptoms is cough which results in wheeze  Hypertension History:      She denies headache, chest pain, palpitations, dyspnea with exertion, orthopnea, PND, peripheral edema, visual symptoms, neurologic problems, syncope, and side effects from treatment.        Positive major cardiovascular risk factors include female age 9 years old or older, diabetes, hyperlipidemia, and hypertension.  Negative major cardiovascular risk factors include non-tobacco-user status.        Further assessment for target organ damage reveals no history of ASHD, stroke/TIA, or peripheral vascular disease.     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 One By Mouth Bid 2)  Multivitamins   Caps (Multiple  Vitamin) .... Once Daily 3)  Baby Aspirin 81 Mg  Chew (Aspirin) .... Once Daily 4)  Norvasc 5 Mg  Tabs (Amlodipine Besylate) .... Once Daily 5)  Diovan Hct 160-12.5 Mg  Tabs (Valsartan-Hydrochlorothiazide) .... Once Daily 6)  Glucophage 500 Mg  Tabs (Metformin Hcl) .... Once Daily 7)  Zetia 10 Mg  Tabs (Ezetimibe) .... Once Daily 8)  Vitamin D3 1000 Unit Caps (Cholecalciferol) .... One By Mouth Daily 9)  Lumigan 0.03 % Soln (Bimatoprost) .[name].. 1 Drop Each At Bedtime 10)  Timolol Maleate 0.5 % Soln (Timolol Maleate) .[name].. 1 Drop Each Eye At Bedtime  Current Medications (verified): 1)  Calcium 500/d 500-400 Mg-Unit Chew (Calcium-Vitamin D) .... One By Mouth Bid 2)  Multivitamins   Caps (Multiple Vitamin) .... Once Daily 3)  Baby Aspirin 81 Mg  Chew (Aspirin) .... Once Daily 4)  Exforge Hct 5-160-12.5 Mg Tabs (Amlodipine-Valsartan-Hctz) .[name].. 1 Once Daily 5)  Glucophage 500 Mg  Tabs (Metformin Hcl) .... Once Daily 6)  Zetia 10 Mg  Tabs (Ezetimibe) .... Once Daily 7)  Vitamin D3 1000 Unit Caps (Cholecalciferol) .... One By Mouth Daily 8)  Lumigan 0.03 % Soln (Bimatoprost) .[name].. 1 Drop Each At Bedtime  Allergies (verified): 1)  ! Effexor  Past History:  Family History: Last updated: 10/24/2006 Father Family History Lung cancer mother at 15 of age complications  Social History: Last updated: 10/24/2006 Retired Single widowed Never Smoked Alcohol use-no  Risk Factors: Exercise: yes (  05/18/2007)  Risk Factors: Smoking Status: never (04/14/2009)  Past medical, surgical, family and social histories (including risk factors) reviewed, and no changes noted (except as noted below).  Past Medical History: Reviewed history from 04/06/2008 and no changes required. Hyperlipidemia Hypertension Osteoporosis bone density in 2007 PMR Diabetes mellitus, type II Osteoarthritis left carotid stenosis  50-60%  stable  Past Surgical History: Reviewed history from 10/24/2006 and no changes  required. Caesarean section foot surgery [name] Appendectomy  Family History: Reviewed history from 10/24/2006 and no changes required. Father Family History Lung cancer mother at 25 of age complications  Social History: Reviewed history from 10/24/2006 and no changes required. Retired Single widowed Never Smoked Alcohol use-no  Review of Systems       The patient complains of hoarseness, dyspnea on exertion, and prolonged cough.  The patient denies anorexia, fever, weight loss, weight gain, vision loss, decreased hearing, chest pain, syncope, peripheral edema, headaches, hemoptysis, abdominal pain, melena, hematochezia, severe indigestion/heartburn, hematuria, incontinence, genital sores, muscle weakness, suspicious skin lesions, transient blindness, difficulty walking, depression, unusual weight change, abnormal bleeding, enlarged lymph nodes, angioedema, breast masses, and testicular masses.         wheezing  Physical Exam  General:  obese.  well-developed.   Head:  Normocephalic and atraumatic without obvious abnormalities. No apparent alopecia or balding. Eyes:  vision grossly intact, pupils equal, and pupils round.   Ears:  R ear normal and L ear normal.   Nose:  External nasal examination shows no deformity or inflammation. Nasal mucosa are pink and moist without lesions or exudates. Mouth:  Oral mucosa and oropharynx without lesions or exudates.  Teeth in good repair. Neck:  supple.   Lungs:  normal respiratory effort, no dullness, and no wheezes.  peak flow was 300cc Heart:  normal rate, regular rhythm, and Grade  2 /6 systolic ejection murmur.   Abdomen:  Bowel sounds positive,abdomen soft and non-tender without masses, organomegaly or hernias noted. Extremities:  No clubbing, cyanosis, edema, or deformity noted with normal full range of motion of all joints.     Impression & Recommendations:  Problem # 1:  DIABETES MELLITUS, TYPE II (ICD-250.00)  Her updated  medication list for this problem includes:    Baby Aspirin 81 Mg Chew (Aspirin) ..... Once daily    Exforge Hct 5-160-12.5 Mg Tabs (Amlodipine-valsartan-hctz) .[name]... 1 once daily    Glucophage 500 Mg Tabs (Metformin hcl) ..... Once daily  Problem # 2:  HYPERTENSION (ICD-401.9)  The following medications were removed from the medication list:    Norvasc 5 Mg Tabs (Amlodipine besylate) ..... Once daily Her updated medication list for this problem includes:    Exforge Hct 5-160-12.5 Mg Tabs (Amlodipine-valsartan-hctz) .[name]... 1 once daily  BP today: 144/70 Prior BP: 130/70 (01/06/2009)  10 Yr Risk Heart Disease: > 32 % Prior 10 Yr Risk Heart Disease: Not enough information (02/02/2007)  Labs Reviewed: K+: 4.8 (09/05/2008) Creat: : 1.1 (09/05/2008)   Chol: 188 (04/07/2009)   HDL: 36.90 (04/07/2009)   LDL: 128 (04/07/2009)   TG: 118.0 (04/07/2009)  Problem # 3:  HYPERLIPIDEMIA (ICD-272.4)  Her updated medication list for this problem includes:    Zetia 10 Mg Tabs (Ezetimibe) ..... Once daily  Labs Reviewed: SGOT: 20 (04/07/2009)   SGPT: 13 (04/07/2009)  Lipid Goals: Chol Goal: 200 (02/02/2007)   HDL Goal: 40 (02/02/2007)   LDL Goal: 100 (02/02/2007)   TG Goal: 150 (02/02/2007)  10 Yr Risk Heart Disease: > 32 % Prior 10 Yr  Risk Heart Disease: Not enough information (02/02/2007)   HDL:36.90 (04/07/2009), 32.90 (09/05/2008)  LDL:128 (04/07/2009), DEL (01/26/2007)  Chol:188 (04/07/2009), 176 (09/05/2008)  Trig:118.0 (04/07/2009), 175 (01/26/2007)  Problem # 4:  ACUTE BRONCHITIS (ICD-466.0)  Take antibiotics and other medications as directed. Encouraged to push clear liquids, get enough rest, and take acetaminophen as needed. To be seen in 5-7 days if no improvement, sooner if worse.  Her updated medication list for this problem includes:    Hydromet 5-1.5 Mg/6ml Syrp (Hydrocodone-homatropine) .[name]..[name] Two tsp by mouth q 6 hours prn  Complete Medication List: 1)  Calcium 500/d 500-400  Mg-unit Chew (Calcium-vitamin d) .... One by mouth bid 2)  Multivitamins Caps (Multiple vitamin) .... Once daily 3)  Baby Aspirin 81 Mg Chew (Aspirin) .... Once daily 4)  Exforge Hct 5-160-12.5 Mg Tabs (Amlodipine-valsartan-hctz) .[name].. 1 once daily 5)  Glucophage 500 Mg Tabs (Metformin hcl) .... Once daily 6)  Zetia 10 Mg Tabs (Ezetimibe) .... Once daily 7)  Vitamin D3 1000 Unit Caps (Cholecalciferol) .... One by mouth daily 8)  Lumigan 0.03 % Soln (Bimatoprost) .[name].. 1 drop each at bedtime 9)  Hydromet 5-1.5 Mg/44ml Syrp (Hydrocodone-homatropine) .... Two tsp by mouth q 6 hours prn  Hypertension Assessment/Plan:      The patient's hypertensive risk group is category C: Target organ damage and/or diabetes.  Her calculated 10 year risk of coronary heart disease is > 32 %.  Today's blood pressure is 144/70.  Her blood pressure goal is < 130/80.  Patient Instructions: 1)  Please schedule a follow-up appointment in 3 months. Prescriptions: HYDROMET 5-1.5 MG/5ML SYRP (HYDROCODONE-HOMATROPINE) two tsp by mouth q 6 hours prn  #6 oz x 1   Entered and Authorized by:   Ricard Dillon MD   Signed by:   Ricard Dillon MD on 04/14/2009   Method used:   Print then Give to Patient   RxID:   [id number] EXFORGE HCT 5-160-12.5 MG TABS (AMLODIPINE-VALSARTAN-HCTZ) 1 once daily  #30 x 6   Entered by:   Allyne Gee, LPN   Authorized by:   Ricard Dillon MD   Signed by:   Allyne Gee, LPN on [id number]   Method used:   [facility]        [facility] [address] [phone]* (retail)       [address]       [place], [place]  [address]       Ph: [phone] or [phone]       Fax: [phone]   RxID:   [id number]

## 2010-05-03 NOTE — Assessment & Plan Note (Signed)
Summary: 3 month rov/njr   Vital Signs:  Patient profile:   77 year old female Height:      60 inches Weight:      164 pounds Temp:     98.3 degrees F oral Pulse rate:   72 / minute Resp:     14 per minute BP sitting:   120 / 64  (left arm)  Vitals Entered By: Allyne Gee, LPN (April 15, 624THL D34-534 PM) CC: roa, Hypertension Management   CC:  roa and Hypertension Management.  History of Present Illness: The pt has been monitering her blood sugars and todya her CBG's were 127 moniering of lipids on zetia  Hypertension History:      She denies headache, chest pain, palpitations, dyspnea with exertion, orthopnea, PND, peripheral edema, visual symptoms, neurologic problems, syncope, and side effects from treatment.        Positive major cardiovascular risk factors include female age 50 years old or older, diabetes, hyperlipidemia, and hypertension.  Negative major cardiovascular risk factors include non-tobacco-user status.        Further assessment for target organ damage reveals no history of ASHD, stroke/TIA, or peripheral vascular disease.     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 One By Mouth Bid 2)  Multivitamins   Caps  (Multiple Vitamin) .... Once Daily 3)  Baby Aspirin 81 Mg  Chew (Aspirin) .... Once Daily 4)  Exforge Hct 5-160-12.5 Mg Tabs (Amlodipine-Valsartan-Hctz) .[name].. 1 Once Daily 5)  Glucophage 500 Mg  Tabs (Metformin Hcl) .... Once Daily 6)  Zetia 10 Mg  Tabs (Ezetimibe) .... Once Daily 7)  Vitamin D3 1000 Unit Caps (Cholecalciferol) .... One By Mouth Daily 8)  Lumigan 0.03 % Soln (Bimatoprost) .[name].. 1 Drop Each At Bedtime  Allergies (verified): 1)  ! Effexor  Past History:  Family History: Last updated: 10/24/2006 Father Family History Lung cancer mother at 51 of age complications  Social History: Last updated: 10/24/2006 Retired Single widowed Never Smoked Alcohol use-no  Risk Factors: Exercise: yes (05/18/2007)  Risk Factors: Smoking Status: never (07/14/2009)  Past medical, surgical, family and social histories (including risk factors) reviewed, and no changes noted (except as noted below).  Past Medical History: Reviewed history from 04/06/2008 and no changes required. Hyperlipidemia Hypertension Osteoporosis bone density in 2007 PMR Diabetes mellitus, type II Osteoarthritis left carotid stenosis  50-60%  stable  Past Surgical History: Reviewed history from 10/24/2006 and no changes required. Caesarean section foot surgery [name] Appendectomy  Family History: Reviewed history from 10/24/2006 and no changes required. Father Family History Lung cancer mother at 23 of age complications  Social History: Reviewed history from 10/24/2006 and no changes required. Retired Single widowed Never Smoked Alcohol use-no  Review of Systems  The patient denies anorexia, fever, weight loss, weight gain,  vision loss, decreased hearing, hoarseness, chest pain, syncope, dyspnea on exertion, peripheral edema, prolonged cough, headaches, hemoptysis, abdominal pain, melena, hematochezia, severe indigestion/heartburn, hematuria, incontinence, genital sores, muscle weakness,  suspicious skin lesions, transient blindness, difficulty walking, depression, unusual weight change, abnormal bleeding, enlarged lymph nodes, angioedema, and breast masses.    Physical Exam  General:  obese.  well-developed.   Head:  Normocephalic and atraumatic without obvious abnormalities. No apparent alopecia or balding. Eyes:  vision grossly intact, pupils equal, and pupils round.   Ears:  R ear normal and L ear normal.   Nose:  External nasal examination shows no deformity or inflammation. Nasal mucosa are pink and moist without lesions or exudates. Mouth:  Oral mucosa and oropharynx without lesions or exudates.  Teeth in good repair. Neck:  supple.   Lungs:  normal respiratory effort, no dullness, and no wheezes.  peak flow was 300cc Heart:  normal rate, regular rhythm, and Grade  2 /6 systolic ejection murmur.   Abdomen:  Bowel sounds positive,abdomen soft and non-tender without masses, organomegaly or hernias noted.  Diabetes Management Exam:    Foot Exam (with socks and/or shoes not present):       Sensory-Pinprick/Light touch:          Left medial foot (L-4): diminished          Left dorsal foot (L-5): diminished          Left lateral foot (S-1): diminished          Right medial foot (L-4): diminished          Right dorsal foot (L-5): diminished          Right lateral foot (S-1): diminished   Impression & Recommendations:  Problem # 1:  GLAUCOMA (ICD-365.9) pts eye exam done but not in the chart  Problem # 2:  DIABETES MELLITUS, TYPE II (ICD-250.00)  Her updated medication list for this problem includes:    Baby Aspirin 81 Mg Chew (Aspirin) ..... Once daily    Exforge Hct 5-160-12.5 Mg Tabs (Amlodipine-valsartan-hctz) .[name]... 1 once daily    Glucophage 500 Mg Tabs (Metformin hcl) ..... Once daily  Labs Reviewed: Creat: 1.1 (09/05/2008)     Last Eye Exam: normal (04/07/2008) Reviewed HgBA1c results: 6.0 (04/07/2009)  6.2 (09/05/2008)  Orders: Prescription Created  Electronically [phone])  Complete Medication List: 1)  Calcium 500/d 500-400 Mg-unit Chew (Calcium-vitamin d) .... One by mouth bid 2)  Multivitamins Caps (Multiple vitamin) .... Once daily 3)  Baby Aspirin 81 Mg Chew (Aspirin) .... Once daily 4)  Exforge Hct 5-160-12.5 Mg Tabs (Amlodipine-valsartan-hctz) .[name].. 1 once daily 5)  Glucophage 500 Mg Tabs (Metformin hcl) .... Once daily 6)  Zetia 10 Mg Tabs (Ezetimibe) .... Once daily 7)  Vitamin D3 1000 Unit Caps (Cholecalciferol) .... One by mouth daily 8)  Lumigan 0.03 % Soln (Bimatoprost) .[name].. 1 drop each at bedtime 9)  Freestyle Lancets Misc (Lancets) .... Use as directed  Other Orders: TLB-TSH (Thyroid Stimulating Hormone) (84443-TSH) TLB-Cholesterol, HDL (83718-HDL) TLB-Cholesterol, Direct LDL (83721-DIRLDL) TLB-Cholesterol, Total (82465-CHO) TLB-CBC Platelet - w/Differential (85025-CBCD) TLB-BMP (Basic Metabolic Panel-BMET) (99991111)  Hypertension Assessment/Plan:      The patient's hypertensive risk group is category C: Target organ damage and/or diabetes.  Her calculated 10 year risk of coronary heart disease is 24 %.  Today's blood pressure is 120/64.  Her blood pressure goal is < 130/80.  Patient Instructions: 1)  Please schedule a follow-up appointment in 4 months. Prescriptions: FREESTYLE LANCETS  MISC (LANCETS)  use as directed  #100 x 3   Entered and Authorized by:   Ricard Dillon MD   Signed by:   Ricard Dillon MD on 07/14/2009   Method used:   [facility]        [facility] [address] [phone]* (retail)       [address]       [place], [place]  [address]       Ph: [phone] or [phone]       Fax: [phone]   RxID:   [id number]   Appended Document: Orders Update    Clinical Lists Changes  Orders: Added new Service order of Venipuncture [phone]) - Signed

## 2010-05-03 NOTE — Progress Notes (Signed)
Summary: lab results  Phone Note Call from Patient   Caller: Patient Call For: Ricard Dillon MD Reason for Call: Lab or Test Results Summary of Call: Pt is calling to ask for lab results from today (Sed rate) and from previous labs in 06/2009. [phone] Initial call taken by: Deanna Artis CMA,  Aug 04, 2009 4:20 PM  Follow-up for Phone Call        Pt. notified of Sed Rate value, and to be sure to take a multi-vitamin with iron for minimally low hgb. Follow-up by: Deanna Artis CMA,  Aug 04, 2009 4:35 PM

## 2010-05-03 NOTE — Consult Note (Signed)
Summary: [facility]   Imported By: Laural Benes 04/18/2009 10:34:44  _____________________________________________________________________  External Attachment:    Type:   Image     Comment:   External Document

## 2010-05-03 NOTE — Progress Notes (Signed)
  Phone Note Call from Patient Call back at Home Phone [phone]   Caller: Patient Call For: Ricard Dillon MD Reason for Call: Insurance Question Summary of Call: Pt is is calling for lab results.  Initial call taken by: [facility] CMA AAMA,  January 23, 2010 4:09 PM  Follow-up for Phone Call        pt given labs results and informed md out of town and if he wants to change anything we will call her. she is currently on a mvi with fe once daily  Follow-up by: Allyne Gee, LPN,  October 26, 624THL 10:12 AM

## 2010-05-03 NOTE — Assessment & Plan Note (Signed)
Summary: 4 MONTH F/U/PT RSC/CJR   Vital Signs:  Patient profile:   77 year old female Height:      60 inches Weight:      163 pounds BMI:     31.95 Temp:     98.2 degrees F oral Pulse rate:   76 / minute Resp:     14 per minute BP sitting:   130 / 78  (left arm)  Vitals Entered By: Allyne Gee, LPN (August 17, 624THL 1:58 PM) CC: roa after stopped zetia Is Patient Diabetic? Yes Did you bring your meter with you today? No   Primary Care Provider:  Ricard Dillon MD  CC:  roa after stopped zetia.  History of Present Illness:  Hyperlipidemia Follow-Up      This is an 77 year old woman who presents for Hyperlipidemia follow-up.  stopped zetia due to side effects which resolved with the withdrawl of the medication dicussion of age appropriate medical management of lipids.  The patient reports abdominal pain, but denies GI upset, flushing, itching, constipation, diarrhea, and fatigue.  The patient denies the following symptoms: chest pain/pressure, exercise intolerance, dypsnea, palpitations, syncope, and pedal edema.  Compliance with medications (by patient report) has been near 100%.  Dietary compliance has been good.  The patient reports exercising occasionally.  Adjunctive measures currently used by the patient include fish oil supplements.    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 One By Mouth Bid 2)  Multivitamins   Caps (Multiple Vitamin) .... Once Daily 3)  Baby Aspirin 81 Mg  Chew (Aspirin) .... Once Daily 4)  Exforge Hct 5-160-12.5 Mg Tabs (Amlodipine-Valsartan-Hctz) .[name].. 1 Once Daily 5)  Glucophage 500 Mg  Tabs (Metformin Hcl) .... Once Daily 6)  Zetia 10 Mg  Tabs (Ezetimibe) .... Once Daily 7)  Vitamin D3 1000 Unit Caps (Cholecalciferol) .... One By Mouth Daily 8)  Lumigan 0.03 % Soln (Bimatoprost) .[name].. 1 Drop Each At Bedtime 9)  Freestyle Lancets  Misc (Lancets) .... Use As Directed  Current Medications (verified): 1)  Calcium 500/d 500-400 Mg-Unit Chew (Calcium-Vitamin D) .... One By Mouth Bid 2)  Multivitamins   Caps (Multiple Vitamin) .... Once Daily 3)  Baby Aspirin 81 Mg  Chew (Aspirin) .... Once Daily 4)  Exforge Hct 5-160-12.5 Mg Tabs (Amlodipine-Valsartan-Hctz) .[name].. 1 Once Daily 5)  Glucophage Xr 500 Mg Xr24h-Tab (Metformin Hcl) .[name].. 1 Once Daily 6)  Vitamin D3 1000 Unit Caps (Cholecalciferol) .... One By Mouth Daily 7)  Lumigan 0.03 % Soln (Bimatoprost) .[name].. 1 Drop Each At Bedtime 8)  Freestyle Lancets  Misc (Lancets) .... Use As Directed  Allergies (verified): 1)  ! Effexor  Past  History:  Family History: Last updated: 10/24/2006 Father Family History Lung cancer mother at 42 of age complications  Social History: Last updated: 10/24/2006 Retired Single widowed Never Smoked Alcohol use-no  Risk Factors: Exercise: yes (05/18/2007)  Risk Factors: Smoking Status: never (11/15/2009)  Past medical, surgical, family and social histories (including risk factors) reviewed, and no changes noted (except as noted below).  Past Medical History: Reviewed history from 04/06/2008 and no changes required. Hyperlipidemia Hypertension Osteoporosis bone density in 2007 PMR Diabetes mellitus, type II Osteoarthritis left carotid stenosis  50-60%  stable  Past Surgical History: Reviewed history from 10/24/2006 and no changes required. Caesarean section foot surgery [name] Appendectomy  Family History: Reviewed history from 10/24/2006 and no changes required. Father Family History Lung cancer mother at 72 of age complications  Social History: Reviewed history from 10/24/2006 and no changes required. Retired Single widowed Never Smoked Alcohol use-no  Review of Systems       The patient complains of abdominal pain.  The patient denies anorexia, fever, weight loss, weight gain, vision loss, decreased hearing, hoarseness, chest pain, syncope, dyspnea on exertion, peripheral edema, prolonged cough, headaches, hemoptysis, melena, hematochezia, severe indigestion/heartburn, hematuria, incontinence, genital sores, muscle weakness, suspicious skin lesions, transient blindness, difficulty walking, depression, unusual weight change, abnormal bleeding, enlarged lymph nodes, angioedema, and breast masses.    Physical Exam  General:  obese.  well-developed.   Head:  Normocephalic and atraumatic without obvious abnormalities. No apparent alopecia or balding. Eyes:  vision grossly intact, pupils equal, and pupils round.   Ears:  R ear normal and L ear normal.    Neck:  supple.  no masses.   Lungs:  normal respiratory effort, no dullness, and no wheezes.  peak flow was 300cc Heart:  normal rate, regular rhythm, and Grade  2 /6 systolic ejection murmur.   Abdomen:  soft and epigastric tenderness.   Extremities:  trace left pedal edema and trace right pedal edema.   Neurologic:  alert & oriented X3 and finger-to-nose normal.     Impression & Recommendations:  Problem # 1:  DIABETES MELLITUS, TYPE II (ICD-250.00) Assessment Unchanged monitering, stable cbg's reported protocol encourages statin but age greater that 64 suggested that side effeft out weight theraputic intervention Her updated medication list for this problem includes:    Baby Aspirin 81 Mg Chew (Aspirin) ..... Once daily    Exforge Hct 5-160-12.5 Mg Tabs (Amlodipine-valsartan-hctz) .[name]... 1 once daily    Glucophage Xr 500 Mg Xr24h-tab (Metformin hcl) .[name]... 1 once daily  Labs Reviewed: Creat: 1.2 (07/14/2009)     Last Eye Exam: normal (04/07/2008) Reviewed HgBA1c results: 6.0 (04/07/2009)  6.2 (09/05/2008)  Orders: Specimen Handling (99000) TLB-A1C / Hgb A1C (Glycohemoglobin) (83036-A1C)  Problem # 2:  HYPERLIPIDEMIA (ICD-272.4) Assessment: Deteriorated stopped the zetia and still has pain,  musle pain and GI side effects The following medications were removed from the medication list:    Zetia 10 Mg Tabs (Ezetimibe) ..... Once daily  Labs Reviewed: SGOT: 20 (04/07/2009)   SGPT:  13 (04/07/2009)  Lipid Goals: Chol Goal: 200 (02/02/2007)   HDL Goal: 40 (02/02/2007)   LDL Goal: 100 (02/02/2007)   TG Goal: 150 (02/02/2007)  Prior 10 Yr Risk Heart Disease: 24 % (07/14/2009)   HDL:35.00 (07/14/2009), 36.90 (04/07/2009)  LDL:128 (04/07/2009), DEL (01/26/2007)  Chol:165 (07/14/2009), 188 (04/07/2009)  Trig:118.0 (04/07/2009), 175 (01/26/2007)  Orders: Venipuncture [id number]) Specimen Handling (99000) TLB-Cholesterol, HDL (83718-HDL) TLB-Cholesterol, Direct LDL  (83721-DIRLDL) TLB-Cholesterol, Total (82465-CHO) TLB-Triglycerides (84478-TRG)  Problem # 3:  OSTEOARTHRITIS (ICD-715.90)  Her updated medication list for this problem includes:    Baby Aspirin 81 Mg Chew (Aspirin) ..... Once daily  Discussed use of medications, application of heat or cold, and exercises.   Complete Medication List: 1)  Calcium 500/d 500-400 Mg-unit Chew (Calcium-vitamin d) .... One by mouth bid 2)  Multivitamins Caps (Multiple vitamin) .... Once daily 3)  Baby Aspirin 81 Mg Chew (Aspirin) .... Once daily 4)  Exforge Hct 5-160-12.5 Mg Tabs (Amlodipine-valsartan-hctz) .[name].. 1 once daily 5)  Glucophage Xr 500 Mg Xr24h-tab (Metformin hcl) .[name].. 1 once daily 6)  Vitamin D3 1000 Unit Caps (Cholecalciferol) .... One by mouth daily 7)  Lumigan 0.03 % Soln (Bimatoprost) .[name].. 1 drop each at bedtime 8)  Freestyle Lancets Misc (Lancets) .... Use as directed  Patient Instructions: 1)  Please schedule a follow-up appointment in 2 months. Prescriptions: GLUCOPHAGE XR 500 MG XR24H-TAB (METFORMIN HCL) 1 once daily  #30 x 6   Entered by:   Allyne Gee, LPN   Authorized by:   Ricard Dillon MD   Signed by:   Allyne Gee, LPN on [id number]   Method used:   Electronically to        [facility] [address] [phone]* (retail)       [address]       [place], [place]  [address]       Ph: [phone] or [phone]       Fax: [phone]   RxID:   [id number] GLUCOPHAGE 500 MG  TABS (METFORMIN HCL) once daily  #90 Tablet x 5   Entered and Authorized by:   Ricard Dillon MD   Signed by:   Ricard Dillon MD on 11/15/2009   Method used:   [facility]        [facility] [address] [phone]* (retail)       [address]       [place], [place]  [address]       Ph: [phone] or [phone]       Fax: [phone]   RxID:   [id number]

## 2010-05-03 NOTE — Progress Notes (Signed)
  Phone Note Call from Patient   Caller: Patient Call For: Ricard Dillon MD Summary of Call: Sara Garza is not working........does not like the feeling.  Will just stay on Aleve. Initial call taken by: [facility] CMA AAMA,  April 23, 2010 1:06 PM  Follow-up for Phone Call        dr Arnoldo Morale is aware Follow-up by: Allyne Gee, LPN,  January 23, [id number] 1:59 PM

## 2010-05-03 NOTE — Progress Notes (Signed)
Summary: muscle pain  Phone Note Call from Patient   Caller: Patient Call For: Ricard Dillon MD Summary of Call: Pt is calling to let Dr Arnoldo Morale know she is having bilateral arm pain from elbows to shoulders, and feels it may be related to Exforge? [id number]  Believes it is muscle pain and not joint related.  Also takes Zetia. Initial call taken by: Deanna Artis CMA,  Aug 03, 2009 4:30 PM  Follow-up for Phone Call        hold the zetia first, less likely to be the exforge obtain a sed rate to rule out polymyalgia Follow-up by: Ricard Dillon MD,  Aug 04, 2009 9:37 AM  Additional Follow-up for Phone Call Additional follow up Details #1::        Pt. notified and will come for sed rate today. Additional Follow-up by: Deanna Artis CMA,  Aug 04, 2009 9:42 AM

## 2010-05-04 ENCOUNTER — Ambulatory Visit: Payer: Self-pay

## 2010-05-04 ENCOUNTER — Other Ambulatory Visit: Payer: Self-pay

## 2010-05-04 ENCOUNTER — Ambulatory Visit: Admit: 2010-05-04 | Payer: Self-pay

## 2010-05-17 ENCOUNTER — Other Ambulatory Visit (INDEPENDENT_AMBULATORY_CARE_PROVIDER_SITE_OTHER): Payer: Medicare Other

## 2010-05-17 ENCOUNTER — Ambulatory Visit (INDEPENDENT_AMBULATORY_CARE_PROVIDER_SITE_OTHER): Payer: Medicare Other

## 2010-05-17 DIAGNOSIS — I6529 Occlusion and stenosis of unspecified carotid artery: Secondary | ICD-10-CM

## 2010-05-18 NOTE — H&P (Signed)
HISTORY AND PHYSICAL EXAMINATION  May 17, 2010  Re:  KAZUE, BRIA                  DOB:  [birth year]  I am dictating for a previous Dr. Amedeo Plenty' patient.  HISTORY OF PRESENT ILLNESS:  The patient is an 77 year old Caucasian female who presents today for continued evaluation of her carotid duplex.  The patient states she has been doing very well and is without complaint.  She denies TIA and CVA symptoms, including sensory or motor deficits, visual deficits, emuresis, aphasia, dysphasia, and gait abnormalities.  The patient does have a past medical history significant for type 2 diabetes, hypertension, hyperlipidemia, all of which are managed medically by her primary care physician.  PAST MEDICAL HISTORY: 1. Type 2 diabetes. 2. Hypertension. 3. Hyperlipidemia. 4. Extracranial cerebrovascular occlusive disease.  MEDICATIONS: 1. Metformin. 2. Exforge. 3. Aspirin 81 mg daily  ALLERGIES:  NO KNOWN DRUG ALLERGIES.  SOCIAL HISTORY:  The patient is widowed with 1 child.  She is retired. She does not smoke nor use alcohol.  FAMILY HISTORY:  Lung cancer.  REVIEW OF SYSTEMS:  A complete review of systems is negative.  Please see HPI for pertinent positives and negative.  PHYSICAL EXAM:  The patient is an 77 year old Caucasian female who is in no acute distress and appears her stated age.  Her blood pressure is 156/84 in the right arm sitting, pulse 69, O2 saturation 100% on room air.  HEENT: Normocephalic, atraumatic.  PERLA.  EOM.  A left carotid bruit is noted.  Cardiac:  Regular rate and rhythm.  Lungs: Clear to auscultation.  Abdomen:  Soft with active bowel sounds. Musculoskeletal: No major deformities or cyanosis is noted.  No focal weakness or paresthesias.  The skin reveals no ulcers or rashes.  IMAGING:  Carotid duplex performed on 05/17/2010 reveals no hemodynamically significant stenosis on the right and a left internal carotid artery stenosis  of 60% to 79%.  This study is unchanged from previous studies.  ASSESSMENT: 1. Patient has a history of a vascular occlusive disease. 2. Hypertension. 3. Diabetes type 2.  PLAN:  The patient will follow up in 1 year for repeat carotid duplex. She is to call with any questions, issues, or problems in the interim. The patient's medical issues will continue to be managed by her primary care physician, and she should continue her daily aspirin regimen.  Leta Baptist, PA  V. Leia Alf, MD Electronically Signed  AY/MEDQ  D:  05/17/2010  T:  05/17/2010  Job:  [id number]

## 2010-06-15 ENCOUNTER — Encounter: Payer: Self-pay | Admitting: Internal Medicine

## 2010-06-18 ENCOUNTER — Encounter: Payer: Self-pay | Admitting: Internal Medicine

## 2010-06-18 ENCOUNTER — Ambulatory Visit (INDEPENDENT_AMBULATORY_CARE_PROVIDER_SITE_OTHER): Payer: Medicare Other | Admitting: Internal Medicine

## 2010-06-18 DIAGNOSIS — I350 Nonrheumatic aortic (valve) stenosis: Secondary | ICD-10-CM | POA: Insufficient documentation

## 2010-06-18 DIAGNOSIS — E119 Type 2 diabetes mellitus without complications: Secondary | ICD-10-CM

## 2010-06-18 DIAGNOSIS — E785 Hyperlipidemia, unspecified: Secondary | ICD-10-CM

## 2010-06-18 DIAGNOSIS — Z79899 Other long term (current) drug therapy: Secondary | ICD-10-CM

## 2010-06-18 DIAGNOSIS — I080 Rheumatic disorders of both mitral and aortic valves: Secondary | ICD-10-CM

## 2010-06-18 DIAGNOSIS — I10 Essential (primary) hypertension: Secondary | ICD-10-CM

## 2010-06-18 LAB — BASIC METABOLIC PANEL
BUN: 31 mg/dL — ABNORMAL HIGH (ref 6–23)
CO2: 26 mEq/L (ref 19–32)
Chloride: 104 mEq/L (ref 96–112)
Creatinine, Ser: 1.2 mg/dL (ref 0.4–1.2)
Potassium: 4.3 mEq/L (ref 3.5–5.1)

## 2010-06-18 MED ORDER — KRILL OIL 1000 MG PO CAPS: 1.0000 | ORAL_CAPSULE | Freq: Three times a day (TID) | ORAL | Status: DC

## 2010-06-18 NOTE — Assessment & Plan Note (Signed)
Stable AS murmur

## 2010-06-18 NOTE — Progress Notes (Signed)
  Subjective:    Patient ID: Sara Garza, female    DOB: May 22, 1923, 77 y.o.   MRN: [id number]  HPI patient is an 77 year old white female who presents for followup of her diabetes hypertension osteoarthritis as well as her hyperlipidemia.  She is stable on all medications and is tolerating that well we discussed monitoring a thyroid on a yearly basis and she does have hyperlipidemia and we'll draw today we discussed her diabetes and her need for an A1c at this time she is tolerating all of her medications is exercising on a regular basis and her weight has remained stable    Review of Systems  Constitutional: Negative for activity change, appetite change and fatigue.  HENT: Negative for ear pain, congestion, neck pain, postnasal drip and sinus pressure.   Eyes: Negative for redness and visual disturbance.  Respiratory: Negative for cough, shortness of breath and wheezing.   Gastrointestinal: Negative for abdominal pain and abdominal distention.  Genitourinary: Negative for dysuria, frequency and menstrual problem.  Musculoskeletal: Negative for myalgias, joint swelling and arthralgias.  Skin: Negative for rash and wound.  Neurological: Negative for dizziness, weakness and headaches.  Hematological: Negative for adenopathy. Does not bruise/bleed easily.  Psychiatric/Behavioral: Negative for sleep disturbance and decreased concentration.   Past Medical History  Diagnosis Date  . Hyperlipidemia   . Hypertension   . Osteoporosis   . PMR (polymyalgia rheumatica)   . Diabetes mellitus   . Arthritis   . Carotid stenosis, left     50-60% stable   Past Surgical History  Procedure Date  . Cesarean section   . Appendectomy   . Foot surgery     Sara Garza    reports that she has never smoked. She does not have any smokeless tobacco history on file. She reports that she drinks alcohol. She reports that she does not use illicit drugs. family history includes COPD in her father; Cancer in  her father; and Lung cancer in an unspecified family member. Allergies  Allergen Reactions  . Venlafaxine     REACTION: palpitation       Objective:   Physical Exam     Elderly white female in no apparent distress appears younger than her stated age blood pressure was 146/72 pulse is 76 and regular HEENT reveals arcus senilis the pupils are equal round reactive to light and accommodation her neck was supple her lung fields were clear to auscultation and percussion there was no egophony her heart examination revealed a regular rate and rhythm and 3/6 systolic murmur heard best at the aortic area consistent with aortic insufficiency/ sclerosis her abdomen was soft and nontender extremity examination revealed trace edema neurologically she was intact with equal grips and normal gait    Assessment & Plan:   monitoring of her blood pressure is within normal range for her age I would not lower her blood pressure lower than her current amount due to the risk of hypotension dizziness and falls her weight is stable she is exercising on a regular basis she is breathing well without any episodes of COPD exacerbations her diabetes is apparently well-controlled and he wants he is monitored today to confirm her home readings in the normal range.   a thyroid test was obtained today to monitor her metabolic functioning A BMET that will be obtained to monitor renal function

## 2010-06-18 NOTE — Assessment & Plan Note (Signed)
The patient is on metformin one tablet daily and she is on amlodipine and valsartan for her blood pressure.   her last blood glucose was in the normal range and she is due a hemoglobin A1c today

## 2010-06-18 NOTE — Assessment & Plan Note (Signed)
Blood pressure is at goal her current medication but we should monitor her creatinine and BUN to make sure that her kidney function is well-preserved

## 2010-06-18 NOTE — Procedures (Unsigned)
CAROTID DUPLEX EXAM  INDICATION:  Follow up carotid artery disease.  HISTORY: Diabetes:  Yes Cardiac:  No Hypertension:  Yes Smoking:  No Previous Surgery:  No CV History:  The patient is currently asymptomatic Amaurosis Fugax No, Paresthesias No, Hemiparesis No                                      RIGHT             LEFT Brachial systolic pressure:         148               146 Brachial Doppler waveforms:         WNL               WNL Vertebral direction of flow:        Antegrade         Antegrade DUPLEX VELOCITIES (cm/sec) CCA peak systolic                   86                65 ECA peak systolic                   76                0000000 ICA peak systolic                   85                [id number] ICA end diastolic                   18                65 PLAQUE MORPHOLOGY:                  Heterogeneous     Heterogenous PLAQUE AMOUNT:                      Mild              Moderate PLAQUE LOCATION:                    ICA/ECA           ICA/ECA/bulb  IMPRESSION: 1. Right side:  No hemodynamically significant stenosis. 2. Left internal carotid 60% to 79% stenosis. 3. Left external carotid artery stenosis. 4. Study is stable compared to previous.  ___________________________________________ Conrad Union, MD  OD/MEDQ  D:  05/17/2010  T:  05/17/2010  Job:  [id number]

## 2010-06-18 NOTE — Assessment & Plan Note (Signed)
She has been intolerant of statins and does not wish to try another trial of them.   she took Zetia once before and seemed to tolerate it so therefore we will attempt another trial of Zetia to see how she does both from a side effects standpoint and from the desired effect standpoint in terms of measuring her cholesterol.  Before this trial we will attempt Kril oil

## 2010-07-19 ENCOUNTER — Other Ambulatory Visit: Payer: Self-pay | Admitting: Internal Medicine

## 2010-08-14 NOTE — Procedures (Signed)
CAROTID DUPLEX EXAM   INDICATION:  Carotid artery disease.   HISTORY:  Diabetes:  Yes.  Cardiac:  No.  Hypertension:  Yes.  Smoking:  No.  Previous Surgery:  No.  CV History:  Asymptomatic.  Amaurosis Fugax No, Paresthesias No, Hemiparesis No.                                       RIGHT               LEFT  Brachial systolic pressure:         146                 142  Brachial Doppler waveforms:         Normal              Normal  Vertebral direction of flow:        Antegrade           Antegrade  DUPLEX VELOCITIES (cm/sec)  CCA peak systolic                   88                  200 (distal)  ECA peak systolic                   58                  [id number]  ICA peak systolic                   86                  [id number]  ICA end diastolic                   22                  37  PLAQUE MORPHOLOGY:                  Mixed               Mixed  PLAQUE AMOUNT:                      Mild                Moderate/severe  PLAQUE LOCATION:                    ICA/ECA/bifurcation ICA/ECA/distal  CCA   IMPRESSION:  1. 1-39% stenosis in the right internal carotid artery.  2. 60-79% stenosis of the left internal carotid artery. Increased      velocities noted in the left distal common carotid artery.  3. No significant change noted when compared to the previous      examination on 02/18/08.       ___________________________________________  P. Drucie Opitz, M.D.   CH/MEDQ  D:  08/18/2008  T:  08/18/2008  Job:  [id number]

## 2010-08-14 NOTE — Procedures (Signed)
CAROTID DUPLEX EXAM   INDICATION:  Followup evaluation of known carotid artery disease.  Previous study was performed of [facility] on Aug 25, 2006, and showed a 20 to 39% right ICA stenosis and a 60 to 79% left ICA  stenosis.   HISTORY:  Diabetes:  orally controlled  Cardiac:  No  Hypertension:  Yes  Smoking:  No  Previous Surgery:  No  CV History:  Patient reports no cerebrovascular symptoms at this time.  Amaurosis Fugax No, Paresthesias No, Hemiparesis No                                       RIGHT             LEFT  Brachial systolic pressure:         148               148  Brachial Doppler waveforms:         Triphasic         Triphasic  Vertebral direction of flow:        Antegrade         Antegrade  DUPLEX VELOCITIES (cm/sec)  CCA peak systolic                   89                70  ECA peak systolic                   89                0000000  ICA peak systolic                   94                0000000  ICA end diastolic                   28                69  PLAQUE MORPHOLOGY:                  Calcified         Soft  PLAQUE AMOUNT:                      Mild              Moderate  PLAQUE LOCATION:                    Proximal ICA      Proximal ICA, ECA   IMPRESSION:  1. 20 to 39% right ICA stenosis.   2.  60 to 79% left ICA stenosis.   3.  Left ECA stenosis.   4.  No significant change since previous study performed Aug 21, 2006.   ___________________________________________  P. Drucie Opitz, M.D.   MC/MEDQ  D:  02/12/2007  T:  02/13/2007  Job:  [id number]

## 2010-08-14 NOTE — Procedures (Signed)
CAROTID DUPLEX EXAM   INDICATION:  Followup carotid artery disease.   HISTORY:  Diabetes:  Orally controlled.  Cardiac:  No.  Hypertension:  Yes.  Smoking:  No.  Previous Surgery:  No.  CV History:  No.  Amaurosis Fugax No, Paresthesias No, Hemiparesis No                                       RIGHT             LEFT  Brachial systolic pressure:         146               142  Brachial Doppler waveforms:         Normal            Normal  Vertebral direction of flow:        Antegrade         Antegrade  DUPLEX VELOCITIES (cm/sec)  CCA peak systolic                   85                74  ECA peak systolic                   77                [id number]  ICA peak systolic                   84                [id number]  ICA end diastolic                   22                66  PLAQUE MORPHOLOGY:                  Calcific          Calcific  PLAQUE AMOUNT:                      Mild              Moderate  PLAQUE LOCATION:                    Proximal ICA  Bifurcation/ICA/ECA   IMPRESSION:  1. High end 60-79% stenosis of the left ICA.  2. 1-39% stenosis of the right ICA.  3. No significant change from the last exam on 02/12/2007.   ___________________________________________  P. Drucie Opitz, M.D.   CH/MEDQ  D:  08/13/2007  T:  08/13/2007  Job:  [id number]

## 2010-08-14 NOTE — Procedures (Signed)
CAROTID DUPLEX EXAM   INDICATION:  Followup carotid artery disease.   HISTORY:  Diabetes:  Yes.  Cardiac:  No.  Hypertension:  Yes.  Smoking:  No.  Previous Surgery:  No.  CV History:  No.  Amaurosis Fugax No, Paresthesias No, Hemiparesis No                                       RIGHT             LEFT  Brachial systolic pressure:         154               150  Brachial Doppler waveforms:         WNL               WNL  Vertebral direction of flow:        Antegrade         Antegrade  DUPLEX VELOCITIES (cm/sec)  CCA peak systolic                   78                M=75, [id number]  ECA peak systolic                   83                [id number]  ICA peak systolic                   80                [id number]  ICA end diastolic                   20                66  PLAQUE MORPHOLOGY:                  Calcified         Calcified  PLAQUE AMOUNT:                      Minimal           Moderate/severe  PLAQUE LOCATION:                    ICA/bifurcation   ICA/ECA/CCA   IMPRESSION:  1. Right ICA shows evidence of 1-39% stenosis.  2. Left ICA shows evidence of 60-79% stenosis.  3. Left ECA stenosis.  4. Left distal CCA stenosis extending into the ICA and ECA.  5. No significant changes from previous study.   ___________________________________________  P. Drucie Opitz, M.D.   AS/MEDQ  D:  02/18/2008  T:  02/18/2008  Job:  [id number]

## 2010-08-14 NOTE — Procedures (Signed)
CAROTID DUPLEX EXAM   INDICATION:  Carotid disease.   HISTORY:  Diabetes:  Yes.  Cardiac:  No.  Hypertension:  Yes.  Smoking:  No.  Previous Surgery:  No.  CV History:  Currently asymptomatic.  Amaurosis Fugax No, Paresthesias No, Hemiparesis No                                       RIGHT             LEFT  Brachial systolic pressure:         152               140  Brachial Doppler waveforms:         Normal            Normal  Vertebral direction of flow:        Antegrade         Antegrade  DUPLEX VELOCITIES (cm/sec)  CCA peak systolic                   94                194 (bifurcation)  ECA peak systolic                   82                [id number]  ICA peak systolic                   82                [id number]  ICA end diastolic                   24                61  PLAQUE MORPHOLOGY:                  Mixed             Mixed  PLAQUE AMOUNT:                      Mild              Moderate / severe  PLAQUE LOCATION:                    ICA / ECA         ICA / ECA /  bifurcation   IMPRESSION:  1. 1%-39% stenosis of the right internal carotid artery.  2. 60%-79% stenosis of the left internal carotid artery with an      increased velocity noted in the left distal common carotid artery /      bifurcation region.  3. No significant change noted when compared to previous exam on      08/18/2008.   ___________________________________________  Nelda Severe. Kellie Simmering, M.D.   CH/MEDQ  D:  02/28/2009  T:  03/01/2009  Job:  [id number]

## 2010-09-12 ENCOUNTER — Other Ambulatory Visit: Payer: Self-pay | Admitting: Dermatology

## 2010-09-17 ENCOUNTER — Encounter: Payer: Self-pay | Admitting: Internal Medicine

## 2010-09-17 ENCOUNTER — Ambulatory Visit (INDEPENDENT_AMBULATORY_CARE_PROVIDER_SITE_OTHER): Payer: Medicare Other | Admitting: Internal Medicine

## 2010-09-17 VITALS — BP 140/70 | HR 76 | Temp 98.2°F | Resp 16 | Ht 60.0 in | Wt 162.0 lb

## 2010-09-17 DIAGNOSIS — E119 Type 2 diabetes mellitus without complications: Secondary | ICD-10-CM

## 2010-09-17 DIAGNOSIS — E785 Hyperlipidemia, unspecified: Secondary | ICD-10-CM

## 2010-09-17 DIAGNOSIS — M1990 Unspecified osteoarthritis, unspecified site: Secondary | ICD-10-CM

## 2010-09-17 DIAGNOSIS — I10 Essential (primary) hypertension: Secondary | ICD-10-CM

## 2010-09-17 MED ORDER — METFORMIN HCL ER 500 MG PO TB24: 500.0000 mg | ORAL_TABLET | Freq: Every day | ORAL | Status: DC

## 2010-09-17 MED ORDER — OMEGA-3 FATTY ACIDS 1000 MG PO CAPS: 1.0000 g | ORAL_CAPSULE | Freq: Every day | ORAL | Status: AC

## 2010-09-17 NOTE — Progress Notes (Signed)
  Subjective:    Patient ID: Sara Garza, female    DOB: 1923-06-24, 77 y.o.   MRN: [id number]  HPI Patient presents for followup of hypertension diabetes and hyperlipidemia.  She is beneficial but is only taking one thousand milligrams daily when she was instructed to take it 3 times a day.  She states she had some difficulty finding a threshold she could tolerate that now spelled 1 from earth fare Her blood pressures have been well controlled she is tolerating her medications without difficulty she has no edema shortness of breath PND orthopnea she recently had surgery for a skin cancer.     Review of Systems  Constitutional: Negative for activity change, appetite change and fatigue.  HENT: Negative for ear pain, congestion, neck pain, postnasal drip and sinus pressure.   Eyes: Negative for redness and visual disturbance.  Respiratory: Negative for cough, shortness of breath and wheezing.   Gastrointestinal: Negative for abdominal pain and abdominal distention.  Genitourinary: Negative for dysuria, frequency and menstrual problem.  Musculoskeletal: Negative for myalgias, joint swelling and arthralgias.  Skin: Negative for rash and wound.  Neurological: Negative for dizziness, weakness and headaches.  Hematological: Negative for adenopathy. Does not bruise/bleed easily.  Psychiatric/Behavioral: Negative for sleep disturbance and decreased concentration.       Objective:   Physical Exam  Constitutional: She is oriented to person, place, and time. She appears well-developed and well-nourished. No distress.  HENT:  Head: Normocephalic and atraumatic.  Right Ear: External ear normal.  Left Ear: External ear normal.  Nose: Nose normal.  Mouth/Throat: Oropharynx is clear and moist.  Eyes: Conjunctivae and EOM are normal. Pupils are equal, round, and reactive to light.  Neck: Normal range of motion. Neck supple. No JVD present. No tracheal deviation present. No thyromegaly present.    Cardiovascular: Normal rate, regular rhythm, normal heart sounds and intact distal pulses.   No murmur heard. Pulmonary/Chest: Effort normal and breath sounds normal. She has no wheezes. She exhibits no tenderness.  Abdominal: Soft. Bowel sounds are normal.  Musculoskeletal: Normal range of motion. She exhibits no edema and no tenderness.  Lymphadenopathy:    She has no cervical adenopathy.  Neurological: She is alert and oriented to person, place, and time. She has normal reflexes. No cranial nerve deficit.  Skin: She is not diaphoretic.  Psychiatric: She has a normal mood and affect. Her behavior is normal.          Assessment & Plan:  Patient's diabetes are in good control her cholesterol will be monitored today because she misunderstood about taking the Feosol 3 times a day 1000 mg bid  Will monitor prior to her next visit her blood pressures in good control we will continue her medications we reviewed the blood work from last visit showing an excellent A1c of 5.6 and a normal thyroid.

## 2010-12-14 ENCOUNTER — Other Ambulatory Visit: Payer: Medicare Other

## 2010-12-21 ENCOUNTER — Ambulatory Visit: Payer: Medicare Other | Admitting: Internal Medicine

## 2010-12-26 ENCOUNTER — Other Ambulatory Visit (INDEPENDENT_AMBULATORY_CARE_PROVIDER_SITE_OTHER): Payer: Medicare Other

## 2010-12-26 DIAGNOSIS — E785 Hyperlipidemia, unspecified: Secondary | ICD-10-CM

## 2010-12-26 LAB — LDL CHOLESTEROL, DIRECT: Direct LDL: 171 mg/dL

## 2010-12-26 LAB — LIPID PANEL
Cholesterol: 233 mg/dL — ABNORMAL HIGH (ref 0–200)
Triglycerides: 114 mg/dL (ref 0.0–149.0)

## 2011-01-02 ENCOUNTER — Ambulatory Visit (INDEPENDENT_AMBULATORY_CARE_PROVIDER_SITE_OTHER): Payer: Medicare Other | Admitting: Internal Medicine

## 2011-01-02 ENCOUNTER — Encounter: Payer: Self-pay | Admitting: Internal Medicine

## 2011-01-02 VITALS — BP 130/80 | HR 76 | Temp 98.3°F | Resp 16 | Ht 60.0 in | Wt 160.0 lb

## 2011-01-02 DIAGNOSIS — I10 Essential (primary) hypertension: Secondary | ICD-10-CM

## 2011-01-02 DIAGNOSIS — Z23 Encounter for immunization: Secondary | ICD-10-CM

## 2011-01-02 DIAGNOSIS — E119 Type 2 diabetes mellitus without complications: Secondary | ICD-10-CM

## 2011-01-02 DIAGNOSIS — R609 Edema, unspecified: Secondary | ICD-10-CM

## 2011-01-02 DIAGNOSIS — E785 Hyperlipidemia, unspecified: Secondary | ICD-10-CM

## 2011-01-02 LAB — BASIC METABOLIC PANEL
BUN: 30 mg/dL — ABNORMAL HIGH (ref 6–23)
CO2: 27 mEq/L (ref 19–32)
Chloride: 106 mEq/L (ref 96–112)
Creatinine, Ser: 1.4 mg/dL — ABNORMAL HIGH (ref 0.4–1.2)
Glucose, Bld: 95 mg/dL (ref 70–99)

## 2011-01-02 LAB — CBC WITH DIFFERENTIAL/PLATELET
Eosinophils Relative: 4.3 % (ref 0.0–5.0)
HCT: 32.9 % — ABNORMAL LOW (ref 36.0–46.0)
Lymphs Abs: 1.1 10*3/uL (ref 0.7–4.0)
MCV: 93.1 fl (ref 78.0–100.0)
Monocytes Absolute: 0.5 10*3/uL (ref 0.1–1.0)
Platelets: 186 10*3/uL (ref 150.0–400.0)
WBC: 4.4 10*3/uL — ABNORMAL LOW (ref 4.5–10.5)

## 2011-01-02 NOTE — Patient Instructions (Signed)
Take Crestor 20 mg once a week at night before bed We are monitoring your kidney function your magnesium and your blood count to see if any of those are contributing to the swelling in your legs other causes that contribute to swelling of the legs include salt in diet decreased exercise and sitting in an upright position with her feet down all contribute to swelling of the feet if the swelling is worse at the end of the day and better when you wake up in the morning until she is at the swelling is mainly positional and we need to exercise strengthen her legs watch her salt

## 2011-01-02 NOTE — Progress Notes (Signed)
  Subjective:    Patient ID: Sara Garza, female    DOB: [birth year], 77 y.o.   MRN: [id number]  HPI  Sara Garza is an 77 year old white female with hypertension hyperlipidemia diabetes and osteoarthritis she presents for followup of her diabetes her A1c today she also presents for monitoring of her lipids.  Labs drawn prior to her office visit show an increase in her LDL C. and her total cholesterol and we discussed the resuming the use of statins.  We spent time with the patient discussing the intervention was statins at her 77 y.o. her risk factors and her current functional state which is high.  Because she has a high functional state we believe that an intervention was statins it is important.  She agreed with this approach and we discussed the side effects and the trial pulse statins to minimize side effects  Review of Systems  Constitutional: Negative for activity change, appetite change and fatigue.  HENT: Negative for ear pain, congestion, neck pain, postnasal drip and sinus pressure.   Eyes: Negative for redness and visual disturbance.  Respiratory: Negative for cough, shortness of breath and wheezing.   Gastrointestinal: Negative for abdominal pain and abdominal distention.  Genitourinary: Negative for dysuria, frequency and menstrual problem.  Musculoskeletal: Negative for myalgias, joint swelling and arthralgias.  Skin: Negative for rash and wound.  Neurological: Negative for dizziness, weakness and headaches.  Hematological: Negative for adenopathy. Does not bruise/bleed easily.  Psychiatric/Behavioral: Negative for sleep disturbance and decreased concentration.       Objective:   Physical Exam  Constitutional: She is oriented to person, place, and time. She appears well-developed and well-nourished. No distress.  HENT:  Head: Normocephalic and atraumatic.  Right Ear: External ear normal.  Left Ear: External ear normal.  Nose: Nose normal.  Mouth/Throat: Oropharynx  is clear and moist.  Eyes: Conjunctivae and EOM are normal. Pupils are equal, round, and reactive to light.  Neck: Normal range of motion. Neck supple. No JVD present. No tracheal deviation present. No thyromegaly present.  Cardiovascular: Normal rate, regular rhythm and intact distal pulses.   Murmur heard. Pulmonary/Chest: Effort normal and breath sounds normal. She has no wheezes. She exhibits no tenderness.  Abdominal: Soft. Bowel sounds are normal.  Musculoskeletal: Normal range of motion. She exhibits edema. She exhibits no tenderness.  Lymphadenopathy:    She has no cervical adenopathy.  Neurological: She is alert and oriented to person, place, and time. She has normal reflexes. No cranial nerve deficit.  Skin: Skin is warm and dry. She is not diaphoretic.  Psychiatric: She has a normal mood and affect. Her behavior is normal.          Assessment & Plan:  Patient has some increased peripheral edema which is most probably due to increased salt intake and positional she states that the edema does resolve in the mornings and worsens as the day progresses.  We talked about salt restriction exercise and consideration for compression stockings.  The patient's blood pressure is well controlled on her current regimen the patient's cholesterol is not controlled and we discussed beginning pulse Crestor 20 mg once weekly and will monitor her cholesterol in 2 months time.  We discussed side effects and benefits of these drugs and reviewed all of her medications with her

## 2011-01-07 ENCOUNTER — Telehealth: Payer: Self-pay | Admitting: *Deleted

## 2011-01-07 ENCOUNTER — Encounter: Payer: Self-pay | Admitting: Internal Medicine

## 2011-01-07 NOTE — Telephone Encounter (Signed)
Pt would like lab results, please.

## 2011-01-08 NOTE — Telephone Encounter (Signed)
Pt instructed by md to start crestor

## 2011-02-22 ENCOUNTER — Other Ambulatory Visit: Payer: Self-pay | Admitting: Internal Medicine

## 2011-03-04 ENCOUNTER — Ambulatory Visit: Payer: Medicare Other | Admitting: Internal Medicine

## 2011-03-28 ENCOUNTER — Ambulatory Visit (INDEPENDENT_AMBULATORY_CARE_PROVIDER_SITE_OTHER): Payer: Medicare Other | Admitting: Internal Medicine

## 2011-03-28 ENCOUNTER — Encounter: Payer: Self-pay | Admitting: Internal Medicine

## 2011-03-28 DIAGNOSIS — E785 Hyperlipidemia, unspecified: Secondary | ICD-10-CM

## 2011-03-28 DIAGNOSIS — E1165 Type 2 diabetes mellitus with hyperglycemia: Secondary | ICD-10-CM

## 2011-03-28 DIAGNOSIS — E1169 Type 2 diabetes mellitus with other specified complication: Secondary | ICD-10-CM

## 2011-03-28 DIAGNOSIS — I10 Essential (primary) hypertension: Secondary | ICD-10-CM

## 2011-03-28 DIAGNOSIS — IMO0002 Reserved for concepts with insufficient information to code with codable children: Secondary | ICD-10-CM

## 2011-03-28 LAB — LIPID PANEL
Cholesterol: 166 mg/dL (ref 0–200)
HDL: 41.1 mg/dL (ref >39.00)
Triglycerides: 175 mg/dL — ABNORMAL HIGH (ref 0.0–149.0)

## 2011-03-28 LAB — BASIC METABOLIC PANEL
BUN: 28 mg/dL — ABNORMAL HIGH (ref 6–23)
CO2: 27 mEq/L (ref 19–32)
Calcium: 9.1 mg/dL (ref 8.4–10.5)
Creatinine, Ser: 1.1 mg/dL (ref 0.4–1.2)
Glucose, Bld: 67 mg/dL — ABNORMAL LOW (ref 70–99)

## 2011-03-28 LAB — HEPATIC FUNCTION PANEL
Albumin: 3.8 g/dL (ref 3.5–5.2)
Total Protein: 7.4 g/dL (ref 6.0–8.3)

## 2011-03-28 MED ORDER — FREESTYLE LANCETS MISC: Status: DC

## 2011-03-28 NOTE — Progress Notes (Signed)
Subjective:    Patient ID: Sara Garza, female    DOB: 1923/07/19, 77 y.o.   MRN: [id number]  HPI Has gained weight exercise planned to start in Jan but not exercising... discussed use it or loose it follow up for blood  Pressure Needs blood sugar check and fear that it is high due to "junk food"     Review of Systems  Constitutional: Negative for activity change, appetite change and fatigue.  HENT: Negative for ear pain, congestion, neck pain, postnasal drip and sinus pressure.   Eyes: Negative for redness and visual disturbance.  Respiratory: Negative for cough, shortness of breath and wheezing.   Gastrointestinal: Negative for abdominal pain and abdominal distention.  Genitourinary: Negative for dysuria, frequency and menstrual problem.  Musculoskeletal: Negative for myalgias, joint swelling and arthralgias.  Skin: Negative for rash and wound.  Neurological: Negative for dizziness, weakness and headaches.  Hematological: Negative for adenopathy. Does not bruise/bleed easily.  Psychiatric/Behavioral: Negative for sleep disturbance and decreased concentration.   Past Medical History  Diagnosis Date  . Hyperlipidemia   . Hypertension   . Osteoporosis   . PMR (polymyalgia rheumatica)   . Diabetes mellitus   . Arthritis   . Carotid stenosis, left     50-60% stable    History   Social History  . Marital Status: Married    Spouse Name: N/A    Number of Children: N/A  . Years of Education: N/A   Occupational History  . Not on file.   Social History Main Topics  . Smoking status: Never Smoker   . Smokeless tobacco: Not on file  . Alcohol Use: Yes     very seldom  . Drug Use: No  . Sexually Active: Not Currently   Other Topics Concern  . Not on file   Social History Narrative  . No narrative on file    Past Surgical History  Procedure Date  . Cesarean section   . Appendectomy   . Foot surgery     [name]    Family History  Problem Relation Age of  Onset  . Lung cancer    . COPD Father   . Cancer Father     lung cancer    Allergies  Allergen Reactions  . Venlafaxine     REACTION: palpitation    Current Outpatient Prescriptions on File Prior to Visit  Medication Sig Dispense Refill  . aspirin 81 MG tablet Take 81 mg by mouth daily.        . bimatoprost (LUMIGAN) 0.03 % ophthalmic drops Place 1 drop into both eyes at bedtime.        . Calcium Carb-Cholecalciferol (CALCIUM 500 +D) 500-400 MG-UNIT TABS Take by mouth daily.        . cholecalciferol (VITAMIN D) 1000 UNITS tablet Take 1,000 Units by mouth daily.        [name] EXFORGE HCT 5-160-12.5 MG TABS TAKE 1 TABLET EVERY DAY  30 tablet  5  . fish oil-omega-3 fatty acids 1000 MG capsule Take 1 capsule (1 g total) by mouth daily.  90 capsule    . KRILL OIL 1000 MG CAPS Take 1 capsule (1,000 mg total) by mouth 3 (three) times daily.      . Lancets (FREESTYLE) lancets 1 each by Other route as needed. Use as instructed       . metFORMIN (GLUCOPHAGE-XR) 500 MG 24 hr tablet Take 1 tablet (500 mg total) by mouth daily with breakfast.  30 tablet  11  . Multiple Vitamin (MULTIVITAMIN) tablet Take 1 tablet by mouth daily.          BP 130/76  Pulse 76  Temp 98.3 F (36.8 C)  Resp 16  Ht 5' (1.524 m)  Wt 164 lb (74.39 kg)  BMI 32.03 kg/m2       Objective:   Physical Exam  Nursing note and vitals reviewed. Constitutional: She is oriented to person, place, and time. She appears well-developed and well-nourished. No distress.  HENT:  Head: Normocephalic and atraumatic.  Right Ear: External ear normal.  Left Ear: External ear normal.  Nose: Nose normal.  Mouth/Throat: Oropharynx is clear and moist.  Eyes: Conjunctivae and EOM are normal. Pupils are equal, round, and reactive to light.  Neck: Normal range of motion. Neck supple. No JVD present. No tracheal deviation present. No thyromegaly present.  Cardiovascular: Normal rate, regular rhythm, normal heart sounds and intact distal  pulses.   No murmur heard. Pulmonary/Chest: Effort normal and breath sounds normal. She has no wheezes. She exhibits no tenderness.  Abdominal: Soft. Bowel sounds are normal.  Musculoskeletal: Normal range of motion. She exhibits no edema and no tenderness.  Lymphadenopathy:    She has no cervical adenopathy.  Neurological: She is alert and oriented to person, place, and time. She has normal reflexes. No cranial nerve deficit.  Skin: Skin is warm and dry. She is not diaphoretic.  Psychiatric: She has a normal mood and affect. Her behavior is normal.          Assessment & Plan:  Blood pressure stable Weight up The patients diet has been poor We spent 30 minutes face-to-face counseling the patient about the impact of poor diet and weight gain on her diabetes and risk factors the patient applied with a history of hyperlipidemia and hypertension.

## 2011-03-28 NOTE — Patient Instructions (Signed)
The patient is instructed to continue all medications as prescribed. Schedule followup with check out clerk upon leaving the clinic  

## 2011-04-10 DIAGNOSIS — H02059 Trichiasis without entropian unspecified eye, unspecified eyelid: Secondary | ICD-10-CM | POA: Diagnosis not present

## 2011-04-10 DIAGNOSIS — H4010X0 Unspecified open-angle glaucoma, stage unspecified: Secondary | ICD-10-CM | POA: Diagnosis not present

## 2011-04-29 DIAGNOSIS — I059 Rheumatic mitral valve disease, unspecified: Secondary | ICD-10-CM | POA: Diagnosis not present

## 2011-05-17 ENCOUNTER — Other Ambulatory Visit: Payer: Medicare Other

## 2011-05-20 ENCOUNTER — Other Ambulatory Visit (INDEPENDENT_AMBULATORY_CARE_PROVIDER_SITE_OTHER): Payer: Medicare Other | Admitting: *Deleted

## 2011-05-20 DIAGNOSIS — I6529 Occlusion and stenosis of unspecified carotid artery: Secondary | ICD-10-CM | POA: Diagnosis not present

## 2011-05-31 ENCOUNTER — Other Ambulatory Visit: Payer: Self-pay | Admitting: *Deleted

## 2011-05-31 DIAGNOSIS — I6529 Occlusion and stenosis of unspecified carotid artery: Secondary | ICD-10-CM

## 2011-05-31 NOTE — Procedures (Unsigned)
CAROTID DUPLEX EXAM  INDICATION:  Follow up carotid disease.  HISTORY: Diabetes:  Yes Cardiac:  No Hypertension:  Yes Smoking:  No Previous Surgery:  No CV History: Amaurosis Fugax No, Paresthesias No, Hemiparesis No                                      RIGHT             LEFT Brachial systolic pressure:         112               112 Brachial Doppler waveforms:         WNL               WNL Vertebral direction of flow:        Antegrade         Antegrade DUPLEX VELOCITIES (cm/sec) CCA peak systolic                   85                71 ECA peak systolic                   87                [id number] ICA peak systolic                   89                [id number] ICA end diastolic                   25                58 PLAQUE MORPHOLOGY:                  Heterogeneous Heterogeneous/calcific PLAQUE AMOUNT:                      Mild              Moderate PLAQUE LOCATION:                    ICA               CCA/ECA/ICA  IMPRESSION: 1. 1% to 39% right ICA stenosis. 2. Stable high-end 40% to 59% left terminal CCA stenosis with     extension into the ICA and ECA.  Previous study showed low-end 60%     to 79% stenosis. 3. Bilateral antegrade vertebral arteries.  ___________________________________________ Conrad Reinholds, MD  LT/MEDQ  D:  05/20/2011  T:  05/20/2011  Job:  [id number]

## 2011-06-03 ENCOUNTER — Encounter: Payer: Self-pay | Admitting: Vascular Surgery

## 2011-06-10 DIAGNOSIS — L821 Other seborrheic keratosis: Secondary | ICD-10-CM | POA: Diagnosis not present

## 2011-06-10 DIAGNOSIS — L819 Disorder of pigmentation, unspecified: Secondary | ICD-10-CM | POA: Diagnosis not present

## 2011-06-10 DIAGNOSIS — Z85828 Personal history of other malignant neoplasm of skin: Secondary | ICD-10-CM | POA: Diagnosis not present

## 2011-06-10 DIAGNOSIS — D239 Other benign neoplasm of skin, unspecified: Secondary | ICD-10-CM | POA: Diagnosis not present

## 2011-06-21 ENCOUNTER — Encounter: Payer: Self-pay | Admitting: Internal Medicine

## 2011-06-21 ENCOUNTER — Ambulatory Visit (INDEPENDENT_AMBULATORY_CARE_PROVIDER_SITE_OTHER): Payer: Medicare Other | Admitting: Internal Medicine

## 2011-06-21 VITALS — BP 136/84 | HR 76 | Temp 98.3°F | Resp 16 | Ht 60.0 in | Wt 164.0 lb

## 2011-06-21 DIAGNOSIS — I10 Essential (primary) hypertension: Secondary | ICD-10-CM | POA: Diagnosis not present

## 2011-06-21 DIAGNOSIS — E119 Type 2 diabetes mellitus without complications: Secondary | ICD-10-CM | POA: Diagnosis not present

## 2011-06-21 DIAGNOSIS — Z79899 Other long term (current) drug therapy: Secondary | ICD-10-CM | POA: Diagnosis not present

## 2011-06-21 DIAGNOSIS — E785 Hyperlipidemia, unspecified: Secondary | ICD-10-CM | POA: Diagnosis not present

## 2011-06-21 DIAGNOSIS — T887XXA Unspecified adverse effect of drug or medicament, initial encounter: Secondary | ICD-10-CM

## 2011-06-21 LAB — BASIC METABOLIC PANEL
BUN: 36 mg/dL — ABNORMAL HIGH (ref 6–23)
Chloride: 106 mEq/L (ref 96–112)
Glucose, Bld: 127 mg/dL — ABNORMAL HIGH (ref 70–99)
Potassium: 4.5 mEq/L (ref 3.5–5.1)
Sodium: 140 mEq/L (ref 135–145)

## 2011-06-21 NOTE — Progress Notes (Signed)
  Subjective:    Patient ID: Sara Garza, female    DOB: 1923-07-23, 78 y.o.   MRN: [id number]  HPI Patient is an 78 year old female who presents for follow up of hypertension hyperlipidemia and arthritis.  We reviewed her medications with her allergies and assessed her blood pressure to be in good control today.  She has no side effects from her medications and is doing reasonably well she has a good appearance and a good level of mental acuity.   Review of Systems  Constitutional: Negative for activity change, appetite change and fatigue.  HENT: Negative for ear pain, congestion, neck pain, postnasal drip and sinus pressure.   Eyes: Negative for redness and visual disturbance.  Respiratory: Negative for cough, shortness of breath and wheezing.   Gastrointestinal: Negative for abdominal pain and abdominal distention.  Genitourinary: Negative for dysuria, frequency and menstrual problem.  Musculoskeletal: Negative for myalgias, joint swelling and arthralgias.  Skin: Negative for rash and wound.  Neurological: Negative for dizziness, weakness and headaches.  Hematological: Negative for adenopathy. Does not bruise/bleed easily.  Psychiatric/Behavioral: Negative for sleep disturbance and decreased concentration.       Objective:   Physical Exam  Vitals reviewed. Constitutional: She is oriented to person, place, and time. She appears well-developed and well-nourished. No distress.  HENT:  Head: Normocephalic and atraumatic.  Right Ear: External ear normal.  Left Ear: External ear normal.  Nose: Nose normal.  Mouth/Throat: Oropharynx is clear and moist.  Eyes: Conjunctivae and EOM are normal. Pupils are equal, round, and reactive to light.  Neck: Normal range of motion. Neck supple. No JVD present. No tracheal deviation present. No thyromegaly present.  Cardiovascular: Normal rate, regular rhythm, normal heart sounds and intact distal pulses.   No murmur heard. Pulmonary/Chest:  Effort normal and breath sounds normal. She has no wheezes. She exhibits no tenderness.  Abdominal: Soft. Bowel sounds are normal.  Musculoskeletal: Normal range of motion. She exhibits no edema and no tenderness.  Lymphadenopathy:    She has no cervical adenopathy.  Neurological: She is alert and oriented to person, place, and time. She has normal reflexes. No cranial nerve deficit.  Skin: Skin is warm and dry. She is not diaphoretic.  Psychiatric: She has a normal mood and affect. Her behavior is normal.          Assessment & Plan:  stable to the standpoint of her hyperlipidemia and diabetes monitoring should be obtained for hemoglobin A1c today as well as a lipid and liver prior to next visit hypertension appears to be stable her current medications and a basic metabolic panel should be ordered. Her arthritis is a chronic concern but appears to be stable. We reviewed the need to continue to be multiple as this is her best prevention of fall risk.

## 2011-06-21 NOTE — Patient Instructions (Signed)
The patient is instructed to continue all medications as prescribed. Schedule followup with check out clerk upon leaving the clinic  

## 2011-06-26 ENCOUNTER — Ambulatory Visit: Payer: Medicare Other | Admitting: Internal Medicine

## 2011-08-21 DIAGNOSIS — H2640 Unspecified secondary cataract: Secondary | ICD-10-CM | POA: Diagnosis not present

## 2011-08-21 DIAGNOSIS — H4011X0 Primary open-angle glaucoma, stage unspecified: Secondary | ICD-10-CM | POA: Diagnosis not present

## 2011-08-21 DIAGNOSIS — Z961 Presence of intraocular lens: Secondary | ICD-10-CM | POA: Diagnosis not present

## 2011-08-21 DIAGNOSIS — H409 Unspecified glaucoma: Secondary | ICD-10-CM | POA: Diagnosis not present

## 2011-08-25 ENCOUNTER — Other Ambulatory Visit: Payer: Self-pay | Admitting: Internal Medicine

## 2011-09-23 ENCOUNTER — Ambulatory Visit (INDEPENDENT_AMBULATORY_CARE_PROVIDER_SITE_OTHER): Payer: Medicare Other | Admitting: Internal Medicine

## 2011-09-23 ENCOUNTER — Encounter: Payer: Self-pay | Admitting: Internal Medicine

## 2011-09-23 VITALS — BP 132/70 | HR 78 | Temp 98.1°F | Ht 60.0 in | Wt 170.0 lb

## 2011-09-23 DIAGNOSIS — R609 Edema, unspecified: Secondary | ICD-10-CM | POA: Diagnosis not present

## 2011-09-23 DIAGNOSIS — R0989 Other specified symptoms and signs involving the circulatory and respiratory systems: Secondary | ICD-10-CM

## 2011-09-23 DIAGNOSIS — I10 Essential (primary) hypertension: Secondary | ICD-10-CM

## 2011-09-23 DIAGNOSIS — D649 Anemia, unspecified: Secondary | ICD-10-CM | POA: Diagnosis not present

## 2011-09-23 DIAGNOSIS — R011 Cardiac murmur, unspecified: Secondary | ICD-10-CM

## 2011-09-23 DIAGNOSIS — R0609 Other forms of dyspnea: Secondary | ICD-10-CM

## 2011-09-23 DIAGNOSIS — R0600 Dyspnea, unspecified: Secondary | ICD-10-CM

## 2011-09-23 LAB — CBC WITH DIFFERENTIAL/PLATELET
Basophils Absolute: 0 10*3/uL (ref 0.0–0.1)
Basophils Relative: 0.6 % (ref 0.0–3.0)
Eosinophils Relative: 3.8 % (ref 0.0–5.0)
Hemoglobin: 11.4 g/dL — ABNORMAL LOW (ref 12.0–15.0)
Lymphocytes Relative: 21.5 % (ref 12.0–46.0)
Monocytes Relative: 8.3 % (ref 3.0–12.0)
Neutro Abs: 4.4 10*3/uL (ref 1.4–7.7)
RBC: 3.67 Mil/uL — ABNORMAL LOW (ref 3.87–5.11)
RDW: 13.3 % (ref 11.5–14.6)
WBC: 6.7 10*3/uL (ref 4.5–10.5)

## 2011-09-23 LAB — BRAIN NATRIURETIC PEPTIDE: Pro B Natriuretic peptide (BNP): 336 pg/mL — ABNORMAL HIGH (ref 0.0–100.0)

## 2011-09-23 MED ORDER — FUROSEMIDE 20 MG PO TABS: 20.0000 mg | ORAL_TABLET | Freq: Two times a day (BID) | ORAL | Status: DC

## 2011-09-23 MED ORDER — OLMESARTAN MEDOXOMIL 40 MG PO TABS: 40.0000 mg | ORAL_TABLET | Freq: Every day | ORAL | Status: DC

## 2011-09-23 NOTE — Progress Notes (Signed)
Subjective:    Patient ID: Sara Garza, female    DOB: 1923/04/04, 78 y.o.   MRN: [id number]  HPI Has gained weight. The patient has noted increased swelling in her legs She is on CCB as well as an ace and mild diuretic She states that her activity has not changed Has been exercising with "chair yoga"   Review of Systems  Constitutional: Negative for activity change, appetite change and fatigue.  HENT: Negative for ear pain, congestion, neck pain, postnasal drip and sinus pressure.   Eyes: Negative for redness and visual disturbance.  Respiratory: Positive for shortness of breath. Negative for cough and wheezing.   Gastrointestinal: Negative for abdominal pain and abdominal distention.  Genitourinary: Negative for dysuria and menstrual problem.  Musculoskeletal: Positive for myalgias and joint swelling. Negative for arthralgias.  Skin: Negative for rash and wound.  Neurological: Positive for weakness. Negative for dizziness and headaches.  Hematological: Negative for adenopathy. Does not bruise/bleed easily.  Psychiatric/Behavioral: Negative for disturbed wake/sleep cycle and decreased concentration.       The patient is instructed to continue all medications as prescribed. Schedule followup with check out clerk upon leaving the clinic Past Medical History  Diagnosis Date  . Hyperlipidemia   . Hypertension   . Osteoporosis   . PMR (polymyalgia rheumatica)   . Diabetes mellitus   . Arthritis   . Carotid stenosis, left     50-60% stable    History   Social History  . Marital Status: Married    Spouse Name: N/A    Number of Children: N/A  . Years of Education: N/A   Occupational History  . Not on file.   Social History Main Topics  . Smoking status: Never Smoker   . Smokeless tobacco: Not on file  . Alcohol Use: Yes     very seldom  . Drug Use: No  . Sexually Active: Not Currently   Other Topics Concern  . Not on file   Social History Narrative  . No  narrative on file    Past Surgical History  Procedure Date  . Cesarean section   . Appendectomy   . Foot surgery     [name]    Family History  Problem Relation Age of Onset  . Lung cancer    . COPD Father   . Cancer Father     lung cancer    Allergies  Allergen Reactions  . Venlafaxine     REACTION: palpitation    Current Outpatient Prescriptions on File Prior to Visit  Medication Sig Dispense Refill  . aspirin 81 MG tablet Take 81 mg by mouth daily.        . bimatoprost (LUMIGAN) 0.03 % ophthalmic drops Place 1 drop into both eyes at bedtime.        . Calcium Carb-Cholecalciferol (CALCIUM 500 +D) 500-400 MG-UNIT TABS Take by mouth daily.        . cholecalciferol (VITAMIN D) 1000 UNITS tablet Take 1,000 Units by mouth daily.        [name] KRILL OIL 1000 MG CAPS Take 1 capsule (1,000 mg total) by mouth 3 (three) times daily.      . Lancets (FREESTYLE) lancets Use as instructed  100 each  3  . metFORMIN (GLUCOPHAGE-XR) 500 MG 24 hr tablet Take 1 tablet (500 mg total) by mouth daily with breakfast.  30 tablet  11  . Multiple Vitamin (MULTIVITAMIN) tablet Take 1 tablet by mouth daily.        [name]  furosemide (LASIX) 20 MG tablet Take 1 tablet (20 mg total) by mouth 2 (two) times daily.  30 tablet  11  . olmesartan (BENICAR) 40 MG tablet Take 1 tablet (40 mg total) by mouth daily.        BP 132/70  Pulse 78  Temp 98.1 F (36.7 C) (Oral)  Ht 5' (1.524 m)  Wt 170 lb (77.111 kg)  BMI 33.20 kg/m2      Objective:   Physical Exam  Nursing note and vitals reviewed. Constitutional: She is oriented to person, place, and time. She appears well-developed and well-nourished. No distress.  HENT:  Head: Normocephalic and atraumatic.  Right Ear: External ear normal.  Left Ear: External ear normal.  Nose: Nose normal.  Mouth/Throat: Oropharynx is clear and moist.  Eyes: Conjunctivae and EOM are normal. Pupils are equal, round, and reactive to light.  Neck: Normal range of motion.  Neck supple. No JVD present. No tracheal deviation present. No thyromegaly present.  Cardiovascular: Normal rate and regular rhythm.   Murmur heard.      3/6 AS  Pulmonary/Chest: Effort normal and breath sounds normal. She has no wheezes. She exhibits no tenderness.  Abdominal: Soft. Bowel sounds are normal.  Musculoskeletal: She exhibits edema and tenderness.  Lymphadenopathy:    She has no cervical adenopathy.  Neurological: She is alert and oriented to person, place, and time. She has normal reflexes. No cranial nerve deficit.  Skin: Skin is warm and dry. She is not diaphoretic.  Psychiatric: She has a normal mood and affect. Her behavior is normal.          Assessment & Plan:  Patient has mild fluid retention she has gained about 5 pounds of which some may be edema in her lower extremity part of this may be the complication of the amlodipine part of this may be contributed by a slight increase in the sound of her aortic insufficiency murmur. We will get an echocardiogram to make sure that this is stable we will alter her medications by giving her furosemide and Benicar.  We will monitor appropriate blood work to make sure that anemia does not play a role and we look at renal function and we will see her back in 6 weeks

## 2011-09-23 NOTE — Patient Instructions (Signed)
To keep the effect of the Lasix mild  take it is 20 mg twice a day.  If the swelling in your feet completely stops then you can cut the Lasix back to once a day  You will take the Benicar once a day in the morning.  You will stop the exforge

## 2011-09-24 ENCOUNTER — Other Ambulatory Visit: Payer: Self-pay | Admitting: Internal Medicine

## 2011-09-30 ENCOUNTER — Ambulatory Visit (HOSPITAL_COMMUNITY): Payer: Medicare Other | Attending: Cardiology | Admitting: Radiology

## 2011-09-30 DIAGNOSIS — I319 Disease of pericardium, unspecified: Secondary | ICD-10-CM | POA: Diagnosis not present

## 2011-09-30 DIAGNOSIS — R0989 Other specified symptoms and signs involving the circulatory and respiratory systems: Secondary | ICD-10-CM | POA: Insufficient documentation

## 2011-09-30 DIAGNOSIS — I517 Cardiomegaly: Secondary | ICD-10-CM | POA: Diagnosis not present

## 2011-09-30 DIAGNOSIS — I359 Nonrheumatic aortic valve disorder, unspecified: Secondary | ICD-10-CM | POA: Insufficient documentation

## 2011-09-30 DIAGNOSIS — R609 Edema, unspecified: Secondary | ICD-10-CM | POA: Diagnosis not present

## 2011-09-30 DIAGNOSIS — R0609 Other forms of dyspnea: Secondary | ICD-10-CM | POA: Insufficient documentation

## 2011-09-30 DIAGNOSIS — I059 Rheumatic mitral valve disease, unspecified: Secondary | ICD-10-CM | POA: Insufficient documentation

## 2011-09-30 DIAGNOSIS — R011 Cardiac murmur, unspecified: Secondary | ICD-10-CM | POA: Insufficient documentation

## 2011-09-30 DIAGNOSIS — I10 Essential (primary) hypertension: Secondary | ICD-10-CM | POA: Diagnosis not present

## 2011-09-30 DIAGNOSIS — R0600 Dyspnea, unspecified: Secondary | ICD-10-CM

## 2011-09-30 NOTE — Progress Notes (Signed)
Echocardiogram performed.  

## 2011-10-08 ENCOUNTER — Telehealth: Payer: Self-pay | Admitting: Internal Medicine

## 2011-10-08 NOTE — Telephone Encounter (Signed)
Caller: Karle/Patient; PCP: Benay Pillow; CB#: [phone]; ; ; Call regarding Requesting Results;  Saphire states that she had an Echo on 09/30/11.  Requesting results.  Please f/u with pt.

## 2011-10-09 ENCOUNTER — Other Ambulatory Visit: Payer: Self-pay | Admitting: *Deleted

## 2011-10-09 ENCOUNTER — Telehealth: Payer: Self-pay | Admitting: *Deleted

## 2011-10-09 DIAGNOSIS — R931 Abnormal findings on diagnostic imaging of heart and coronary circulation: Secondary | ICD-10-CM

## 2011-10-09 NOTE — Telephone Encounter (Signed)
Done

## 2011-10-09 NOTE — Telephone Encounter (Signed)
Cardiology referal per dr Arnoldo Morale- pt informed

## 2011-10-28 ENCOUNTER — Telehealth: Payer: Self-pay | Admitting: Internal Medicine

## 2011-10-28 NOTE — Telephone Encounter (Signed)
Caller: [place]/Patient; PCP: Benay Pillow; CB#: [phone] or [phone].; ; ; Call regarding Side Effects To Medication;  Pt reports that her medication was changed to Benicar 3 weeks ago and she has nausea and SOB. She feels SOB is worse than before. She does have an appt with Cardiologist on Wednesday. Emergent s/s of Breathing problems protocol r/o. Pt to see provider within 4hrs. No appts seen with provider or PA, message sent.

## 2011-10-28 NOTE — Telephone Encounter (Signed)
Per Dr Arnoldo Morale d/c the Benicar and lasix and go back to the Exforge and keep appt with cardiology on Wednesday.  Pt aware

## 2011-10-30 ENCOUNTER — Encounter: Payer: Self-pay | Admitting: Cardiovascular Disease

## 2011-10-30 ENCOUNTER — Ambulatory Visit (INDEPENDENT_AMBULATORY_CARE_PROVIDER_SITE_OTHER): Payer: Medicare Other | Admitting: Cardiovascular Disease

## 2011-10-30 VITALS — BP 159/83 | Ht 60.0 in | Wt 166.0 lb

## 2011-10-30 DIAGNOSIS — I359 Nonrheumatic aortic valve disorder, unspecified: Secondary | ICD-10-CM | POA: Diagnosis not present

## 2011-10-30 DIAGNOSIS — I080 Rheumatic disorders of both mitral and aortic valves: Secondary | ICD-10-CM

## 2011-10-30 DIAGNOSIS — I350 Nonrheumatic aortic (valve) stenosis: Secondary | ICD-10-CM

## 2011-10-30 NOTE — Progress Notes (Signed)
HPI:  This is an 78 year old woman presenting for evaluation of aortic stenosis. The patient was seen recently by Dr. Arnoldo Morale, and she was noted to have a change in the characteristics of her heart murmur. An echocardiogram was ordered and demonstrated severe aortic stenosis. She is referred for further evaluation.  She reports chronic dyspnea with exertion that has been present for 2-3 years. There has been slow progression, but no dramatic change. She is dyspneic with low to moderate activity such as walking to her mailbox. She has no resting dyspnea, orthopnea, or PND. She has had some leg edema. She also complains of chest tightness with exertion that resolves with rest. She states this is been present for some time and really has not progressed. Recently, she's had 2 episodes of lightheadedness that occurred with walking. She has never had syncope.  She otherwise has been very healthy over the years. She's had diabetes, hypertension, and hyperlipidemia, all well-controlled. She's never had major surgery or prolonged hospitalization. She lives independently, continues to drive a car, and has an active lifestyle. Her daughter lives in town and is supportive. The patient is widowed.  Outpatient Encounter Prescriptions as of 10/30/2011  Medication Sig Dispense Refill  . Amlodipine-Valsartan-HCTZ (EXFORGE HCT) 5-160-12.5 MG TABS Take by mouth daily.      [name] aspirin 81 MG tablet Take 81 mg by mouth daily.        . bimatoprost (LUMIGAN) 0.03 % ophthalmic drops Place 1 drop into both eyes at bedtime.        . Calcium Carb-Cholecalciferol (CALCIUM 500 +D) 500-400 MG-UNIT TABS Take by mouth daily.        . cholecalciferol (VITAMIN D) 1000 UNITS tablet Take 1,000 Units by mouth daily.        [name] KRILL OIL 1000 MG CAPS Take 1 capsule (1,000 mg total) by mouth 3 (three) times daily.      . Lancets (FREESTYLE) lancets Use as instructed  100 each  3  . metFORMIN (GLUCOPHAGE-XR) 500 MG 24 hr tablet TAKE 1 TABLET  BY MOUTH DAILY WITH BREAKFAST.  30 tablet  11  . Multiple Vitamin (MULTIVITAMIN) tablet Take 1 tablet by mouth daily.        [name] DISCONTD: furosemide (LASIX) 20 MG tablet Take 1 tablet (20 mg total) by mouth 2 (two) times daily.  30 tablet  11  . DISCONTD: olmesartan (BENICAR) 40 MG tablet Take 1 tablet (40 mg total) by mouth daily.        Venlafaxine  Past Medical History  Diagnosis Date  . Hyperlipidemia   . Hypertension   . Osteoporosis   . PMR (polymyalgia rheumatica)   . Diabetes mellitus   . Arthritis   . Carotid stenosis, left     50-60% stable    Past Surgical History  Procedure Date  . Cesarean section   . Appendectomy   . Foot surgery     [name]    History   Social History  . Marital Status: Married    Spouse Name: N/A    Number of Children: N/A  . Years of Education: N/A   Occupational History  . Not on file.   Social History Main Topics  . Smoking status: Never Smoker   . Smokeless tobacco: Never Used  . Alcohol Use: Yes     very seldom  . Drug Use: No  . Sexually Active: Not Currently   Other Topics Concern  . Not on file   Social History Narrative  .  No narrative on file    Family History  Problem Relation Age of Onset  . Lung cancer    . COPD Father   . Cancer Father     lung cancer    ROS: General: no fevers/chills/night sweats Eyes: no blurry vision, diplopia, or amaurosis ENT: no sore throat or hearing loss Resp: no cough, wheezing, or hemoptysis CV: no palpitations, otherwise see history of present illness GI: no abdominal pain, nausea, vomiting, diarrhea, or constipation GU: no dysuria, frequency, or hematuria Skin: no rash Neuro: no headache, numbness, tingling, or weakness of extremities Musculoskeletal: no joint pain or swelling Heme: no bleeding, DVT, or easy bruising Endo: no polydipsia or polyuria  BP 159/83  Ht 5' (1.524 m)  Wt 166 lb (75.297 kg)  BMI 32.42 kg/m2  PHYSICAL EXAM: Pt is alert and oriented, WD,  WN, pleasant elderly woman in no distress. HEENT: normal Neck: JVP normal. Carotid upstrokes normal with bilateral bruits. No thyromegaly. Lungs: equal expansion, clear bilaterally CV: Apex is discrete and nondisplaced, RRR with grade 3/6 crescendo decrescendo murmur best heard at the right upper sternal border with diminished A2  Abd: soft, NT, +BS, no bruit, no hepatosplenomegaly Back: no CVA tenderness Ext: no C/C/E        Femoral pulses 2+= without bruits        DP/PT pulses intact and = Skin: warm and dry without rash Neuro: CNII-XII intact             Strength intact = bilaterally  EKG:  Normal sinus rhythm 94 beats per minute, ST and T wave abnormality consider lateral ischemia.  2-D echo: Study Conclusions  - Left ventricle: The cavity size was normal. Wall thickness was increased in a pattern of moderate LVH. Systolic function was normal. The estimated ejection fraction was in the range of 55% to 60%. Wall motion was normal; there were no regional wall motion abnormalities. Doppler parameters are consistent with abnormal left ventricular relaxation (grade 1 diastolic dysfunction). - Aortic valve: Valve mobility was restricted. There was severe stenosis. Valve area: 0.58cm^2 (Vmax). - Mitral valve: Calcified annulus. The findings are consistent with mild to moderate stenosis. Mild regurgitation. Valve area by pressure half-time: 1.38cm^2. - Left atrium: The atrium was moderately dilated. - Pulmonary arteries: Systolic pressure was mildly increased. PA peak pressure: 30mm Hg (S). - Pericardium, extracardiac: A small pericardial effusion was identified.  Carotid duplex 05/20/2011: Less than 39% right ICA stenosis, 40-59% left ICA stenosis.  STS Risk Calculator: Procedure Name Isolated AVR Risk of Mortality 5.914%  Morbidity or Mortality 25.399%  Long Length of Stay 12.207%  Short Length of Stay 14.624%  Permanent Stroke 3.367%  Prolonged Ventilation 17.914%  DSW  Infection 0.173%  Renal Failure 10.148%  Reoperation 8.660%  ASSESSMENT AND PLAN:

## 2011-10-30 NOTE — Patient Instructions (Addendum)
Your physician has requested that you have a cardiac catheterization ([facility] lab, Right and Left Heart Cath with Dr Lequita Asal call [phone] to schedule). Cardiac catheterization is used to diagnose and/or treat various heart conditions. Doctors may recommend this procedure for a number of different reasons. The most common reason is to evaluate chest pain. Chest pain can be a symptom of coronary artery disease (CAD), and cardiac catheterization can show whether plaque is narrowing or blocking your heart's arteries. This procedure is also used to evaluate the valves, as well as measure the blood flow and oxygen levels in different parts of your heart. For further information please visit [email or web address]. Please follow instruction sheet, as given.  Your physician recommends that you continue on your current medications as directed. Please refer to the Current Medication list given to you today.

## 2011-10-31 NOTE — Assessment & Plan Note (Signed)
This is an 78 year old woman with symptomatic severe calcific aortic stenosis. She is otherwise quite healthy and really has not limited by many other medical problems. She has developed typical symptoms of aortic stenosis with angina, exertional dyspnea, and lightheadedness. We had a long discussion about the implications of aortic stenosis, limitations of medical therapy, and associated prognosis. Her risk of traditional aortic valve replacement his increased because of her advanced age. Comorbidities include diabetes, hypertension, and carotid disease. However, it is important to note that her diabetes and hypertension have been well-controlled and her carotid disease is fairly mild. The next step in her evaluation would be right and left heart catheterization to determine her hemodynamics and presence or absence of coronary disease. I reviewed the risks, indications, and alternatives to cardiac catheterization. Risks include but are not limited to bleeding, vascular injury, stroke, myocardial infarction, and death. The patient would like to talk this over with Dr. Arnoldo Morale before she schedules her cardiac catheterization, but I suspect she will proceed. She is very leery about considering open heart surgery and we will discuss further as her evaluation moves forward. She may be a candidate for transcatheter aortic valve replacement, but I'm not sure her STS risk is high enough to qualify her for the procedure.  I appreciate the opportunity to meet this nice lady. We will be in touch with her after she sees Dr. Arnoldo Morale next week.

## 2011-11-06 ENCOUNTER — Encounter: Payer: Self-pay | Admitting: Internal Medicine

## 2011-11-06 ENCOUNTER — Ambulatory Visit (INDEPENDENT_AMBULATORY_CARE_PROVIDER_SITE_OTHER): Payer: Medicare Other | Admitting: Internal Medicine

## 2011-11-06 VITALS — BP 140/70 | HR 72 | Temp 98.2°F | Resp 16 | Ht 60.0 in | Wt 166.0 lb

## 2011-11-06 DIAGNOSIS — I509 Heart failure, unspecified: Secondary | ICD-10-CM | POA: Diagnosis not present

## 2011-11-06 DIAGNOSIS — I359 Nonrheumatic aortic valve disorder, unspecified: Secondary | ICD-10-CM

## 2011-11-06 DIAGNOSIS — I350 Nonrheumatic aortic (valve) stenosis: Secondary | ICD-10-CM

## 2011-11-06 DIAGNOSIS — I10 Essential (primary) hypertension: Secondary | ICD-10-CM

## 2011-11-06 DIAGNOSIS — IMO0001 Reserved for inherently not codable concepts without codable children: Secondary | ICD-10-CM

## 2011-11-06 LAB — HEMOGLOBIN A1C: Hgb A1c MFr Bld: 6 % (ref 4.6–6.5)

## 2011-11-06 NOTE — Progress Notes (Signed)
  Subjective:    Patient ID: Sara Garza, female    DOB: 1924/03/16, 78 y.o.   MRN: [id number]  HPI Office visit today is focused on reviewing the need for valvular replacement.  Discussing options for valve replacement and given to her overall performance status of this is an appropriate intervention for her at this time.  She has a history of diabetes with well-controlled hypertension moderately well-controlled she is an active alert and vital elderly woman   Review of Systems  Constitutional: Negative for activity change, appetite change and fatigue.  HENT: Negative for ear pain, congestion, neck pain, postnasal drip and sinus pressure.   Eyes: Negative for redness and visual disturbance.  Respiratory: Positive for shortness of breath. Negative for cough and wheezing.   Gastrointestinal: Negative for abdominal pain and abdominal distention.  Genitourinary: Negative for dysuria, frequency and menstrual problem.  Musculoskeletal: Negative for myalgias, joint swelling and arthralgias.  Skin: Negative for rash and wound.  Neurological: Negative for dizziness, weakness and headaches.  Hematological: Negative for adenopathy. Does not bruise/bleed easily.  Psychiatric/Behavioral: Negative for disturbed wake/sleep cycle and decreased concentration.       Objective:   Physical Exam  Constitutional: She is oriented to person, place, and time. She appears well-developed and well-nourished.  Eyes: Conjunctivae are normal. Pupils are equal, round, and reactive to light.  Cardiovascular:  Murmur heard. Pulmonary/Chest: Effort normal and breath sounds normal.  Abdominal: Soft. Bowel sounds are normal.  Neurological: She is alert and oriented to person, place, and time.  Psychiatric: She has a normal mood and affect. Her behavior is normal.          Assessment & Plan:  Agree with indications for valve replacement stable diabetes stable hypertension

## 2011-11-06 NOTE — Patient Instructions (Signed)
Based upon my assessment of your overall condition and based upon the fact that the murmur has only recently become as loud as it has back if the cardiac catheterization does not show disease that you'll have a very good result from valvular replacement and a very reasonable chance of an excellent recovery. If we do not replace the valve you'll require increasing amounts of medication to prevent heart failure and to control blood pressure which may make you feel increasingly weak tired and have undesirable side effects

## 2011-11-07 ENCOUNTER — Telehealth: Payer: Self-pay | Admitting: Cardiovascular Disease

## 2011-11-07 ENCOUNTER — Encounter: Payer: Self-pay | Admitting: *Deleted

## 2011-11-07 NOTE — Telephone Encounter (Signed)
8/8--spoke with ms Lave and arranged for her c cath to be done 11/13/11 at 10:00am--ms Iannuzzi will come in Monday 8/12 for lab work --i advised ms Rho we would go over instructions with her when she comes for lab work--pt agrees to date and time and lab work ordered in computer--nt

## 2011-11-07 NOTE — Telephone Encounter (Signed)
8/8--phoned pt's daughter to set up c cath--no answer--LM on voice mail to phone [place] back to go over set up--nt

## 2011-11-07 NOTE — Telephone Encounter (Signed)
New msg Pt's daughter called and wanted to know when she could have heart cath. Please call

## 2011-11-11 ENCOUNTER — Ambulatory Visit (INDEPENDENT_AMBULATORY_CARE_PROVIDER_SITE_OTHER): Payer: Medicare Other | Admitting: *Deleted

## 2011-11-11 DIAGNOSIS — I060 Rheumatic aortic stenosis: Secondary | ICD-10-CM | POA: Diagnosis not present

## 2011-11-11 LAB — BASIC METABOLIC PANEL
BUN: 31 mg/dL — ABNORMAL HIGH (ref 6–23)
CO2: 27 mEq/L (ref 19–32)
Calcium: 8.8 mg/dL (ref 8.4–10.5)
Chloride: 105 mEq/L (ref 96–112)
Creatinine, Ser: 1.3 mg/dL — ABNORMAL HIGH (ref 0.4–1.2)
Glucose, Bld: 126 mg/dL — ABNORMAL HIGH (ref 70–99)

## 2011-11-11 LAB — PROTIME-INR: INR: 1 ratio (ref 0.8–1.0)

## 2011-11-11 LAB — CBC WITH DIFFERENTIAL/PLATELET
Basophils Absolute: 0 10*3/uL (ref 0.0–0.1)
Basophils Relative: 0.6 % (ref 0.0–3.0)
HCT: 32.6 % — ABNORMAL LOW (ref 36.0–46.0)
Hemoglobin: 10.9 g/dL — ABNORMAL LOW (ref 12.0–15.0)
Lymphs Abs: 1.1 10*3/uL (ref 0.7–4.0)
MCHC: 33.3 g/dL (ref 30.0–36.0)
Monocytes Relative: 10.1 % (ref 3.0–12.0)
Neutro Abs: 3.2 10*3/uL (ref 1.4–7.7)
RBC: 3.57 Mil/uL — ABNORMAL LOW (ref 3.87–5.11)
RDW: 13.2 % (ref 11.5–14.6)

## 2011-11-11 NOTE — Telephone Encounter (Signed)
Pt aware of cardiac cath instructions.

## 2011-11-12 ENCOUNTER — Other Ambulatory Visit: Payer: Self-pay | Admitting: Cardiovascular Disease

## 2011-11-13 ENCOUNTER — Inpatient Hospital Stay (HOSPITAL_BASED_OUTPATIENT_CLINIC_OR_DEPARTMENT_OTHER)
Admission: RE | Admit: 2011-11-13 | Discharge: 2011-11-13 | Disposition: A | Discharge status: Home / Self Care | Payer: Medicare Other | Source: Ambulatory Visit | Attending: Cardiovascular Disease | Admitting: Cardiovascular Disease

## 2011-11-13 ENCOUNTER — Encounter (HOSPITAL_BASED_OUTPATIENT_CLINIC_OR_DEPARTMENT_OTHER)
Admission: RE | Disposition: A | Discharge status: Home / Self Care | Payer: Self-pay | Source: Ambulatory Visit | Attending: Cardiovascular Disease

## 2011-11-13 DIAGNOSIS — I359 Nonrheumatic aortic valve disorder, unspecified: Secondary | ICD-10-CM | POA: Insufficient documentation

## 2011-11-13 DIAGNOSIS — E785 Hyperlipidemia, unspecified: Secondary | ICD-10-CM | POA: Diagnosis not present

## 2011-11-13 DIAGNOSIS — R0609 Other forms of dyspnea: Secondary | ICD-10-CM | POA: Insufficient documentation

## 2011-11-13 DIAGNOSIS — I10 Essential (primary) hypertension: Secondary | ICD-10-CM | POA: Insufficient documentation

## 2011-11-13 DIAGNOSIS — E119 Type 2 diabetes mellitus without complications: Secondary | ICD-10-CM | POA: Insufficient documentation

## 2011-11-13 DIAGNOSIS — R0989 Other specified symptoms and signs involving the circulatory and respiratory systems: Secondary | ICD-10-CM | POA: Insufficient documentation

## 2011-11-13 DIAGNOSIS — I2510 Atherosclerotic heart disease of native coronary artery without angina pectoris: Secondary | ICD-10-CM | POA: Diagnosis not present

## 2011-11-13 DIAGNOSIS — I6529 Occlusion and stenosis of unspecified carotid artery: Secondary | ICD-10-CM | POA: Insufficient documentation

## 2011-11-13 LAB — POCT I-STAT 3, ART BLOOD GAS (G3+)
Acid-base deficit: 2 mmol/L (ref 0.0–2.0)
Bicarbonate: 22.8 mEq/L (ref 20.0–24.0)
O2 Saturation: 88 %
O2 Saturation: 93 %
TCO2: 25 mmol/L (ref 0–100)
pCO2 arterial: 37.5 mmHg (ref 35.0–45.0)
pH, Arterial: 7.403 (ref 7.350–7.450)
pO2, Arterial: 55 mmHg — ABNORMAL LOW (ref 80.0–100.0)
pO2, Arterial: 67 mmHg — ABNORMAL LOW (ref 80.0–100.0)

## 2011-11-13 LAB — POCT I-STAT 3, VENOUS BLOOD GAS (G3P V)
Bicarbonate: 23.7 mEq/L (ref 20.0–24.0)
O2 Saturation: 63 %
TCO2: 25 mmol/L (ref 0–100)
pCO2, Ven: 40.8 mmHg — ABNORMAL LOW (ref 45.0–50.0)
pH, Ven: 7.373 — ABNORMAL HIGH (ref 7.250–7.300)

## 2011-11-13 SURGERY — JV LEFT AND RIGHT HEART CATHETERIZATION WITH CORONARY ANGIOGRAM
Anesthesia: Moderate Sedation

## 2011-11-13 MED ORDER — ACETAMINOPHEN 325 MG PO TABS: 650.0000 mg | ORAL_TABLET | ORAL | Status: DC | PRN

## 2011-11-13 MED ORDER — SODIUM CHLORIDE 0.9 % IV SOLN: 1.0000 mL/kg/h | INTRAVENOUS | Status: DC

## 2011-11-13 MED ORDER — SODIUM CHLORIDE 0.9 % IV SOLN: 250.0000 mL | INTRAVENOUS | Status: DC | PRN

## 2011-11-13 MED ORDER — ONDANSETRON HCL 4 MG/2ML IJ SOLN: 4.0000 mg | Freq: Four times a day (QID) | INTRAMUSCULAR | Status: DC | PRN

## 2011-11-13 MED ORDER — DIAZEPAM 2 MG PO TABS
2.0000 mg | ORAL_TABLET | ORAL | Status: AC
  Administered 2011-11-13: 2 mg via ORAL

## 2011-11-13 MED ORDER — SODIUM CHLORIDE 0.9 % IV SOLN
INTRAVENOUS | Status: DC
  Administered 2011-11-13: 10:00:00 via INTRAVENOUS

## 2011-11-13 MED ORDER — ASPIRIN 81 MG PO CHEW
324.0000 mg | CHEWABLE_TABLET | ORAL | Status: AC
  Administered 2011-11-13: 324 mg via ORAL

## 2011-11-13 MED ORDER — SODIUM CHLORIDE 0.9 % IJ SOLN: 3.0000 mL | Freq: Two times a day (BID) | INTRAMUSCULAR | Status: DC

## 2011-11-13 MED ORDER — SODIUM CHLORIDE 0.9 % IJ SOLN: 3.0000 mL | INTRAMUSCULAR | Status: DC | PRN

## 2011-11-13 NOTE — Progress Notes (Signed)
Bedrest begins @ 1240.  Tegaderm dressing applied to right groin site.  Dr. Burt Knack in to discuss results with patient and daughter.

## 2011-11-13 NOTE — Interval H&P Note (Signed)
History and Physical Interval Note:  11/13/2011 11:44 AM  [place]  has presented today for surgery, with the diagnosis of AS  The various methods of treatment have been discussed with the patient and family. After consideration of risks, benefits and other options for treatment, the patient has consented to  Procedure(s) (LRB): [facility] LEFT AND RIGHT HEART CATHETERIZATION WITH CORONARY ANGIOGRAM (N/A) as a surgical intervention .  The patient's history has been reviewed, patient examined, no change in status, stable for surgery.  I have reviewed the patient's chart and labs.  Questions were answered to the patient's satisfaction.     Sherren Mocha  11/13/2011 11:44 AM

## 2011-11-13 NOTE — CV Procedure (Signed)
   Cardiac Catheterization Procedure Note  Name: Sara Garza MRN: [id number] DOB: 12-16-1923  Procedure: Right Heart Cath, Selective Coronary Angiography  Indication: Severe symptomatic aortic stenosis   Procedural Details: The right groin was prepped, draped, and anesthetized with 1% lidocaine. Using the modified Seldinger technique a 4 French sheath was placed in the right femoral artery and a 6 French sheath was placed in the right femoral vein. A multipurpose catheter was used for the right heart catheterization. Standard protocol was followed for recording of right heart pressures and sampling of oxygen saturations. Fick cardiac output was calculated. Standard Judkins catheters were used for selective coronary angiography. The aortic valve was not crossed as the patient has known severe aortic stenosis. There were no immediate procedural complications. The patient was transferred to the post catheterization recovery area for further monitoring.  Procedural Findings: Hemodynamics RA mean of 3 RV 43/5 PA 42/15 with a mean of 26 PCWP mean of 13 LV not recorded AO 119/50 with a mean of 78  Oxygen saturations: PA 63 AO 93  Cardiac Output (Fick) 4.9  Cardiac Index (Fick) 2.8   Coronary angiography: Coronary dominance: right  Left mainstem: Patent with no obstructive disease  Left anterior descending (LAD): The LAD courses to the left ventricular apex. There is mild plaque in the proximal LAD with associated calcification. I would estimate the percent stenosis in the 40-50 range. The mid LAD has mild diffuse irregularity without significant stenosis. The first septal perforator is fairly large with no significant disease. The first diagonal is of small caliber with 70% ostial stenosis the  Left circumflex (LCx): The left circumflex is a large vessel. There are 2 large obtuse marginal branches. The mid vessel before the OM branches has a smooth 70% stenosis. There is no other  significant disease seen throughout the distribution of the left circumflex the  Right coronary artery (RCA): This is a dominant vessel. The proximal and mid vessel are diffusely diseased beyond the first RV marginal branch. There is 60-70% proximal stenosis and 80% mid stenosis. The distal vessel is smooth and appearance and has no significant disease. The PDA and PLA branches are patent.  Left ventriculography: Not done   Final Conclusions:   1. Known severe aortic stenosis with fairly well-maintained intracardiac hemodynamics 2. Multivessel coronary artery disease with severe stenosis of the mid right coronary artery, moderately severe stenosis of the left circumflex, and nonobstructive disease of the LAD.  Recommendations: Difficult situation in this 78 year old woman. She is not eager to pursue aortic valve surgery. However, she clearly has severe symptomatic aortic stenosis. I am going to send her for a cardiac surgery consultation if she is willing. She could be considered for transcatheter aortic valve replacement, but her STS score puts are only in a moderate risk category and she would have to be done through a clinical trial. Her coronary disease could be treated percutaneously and I would probably use pressure wire analysis to direct coronary stenting. With that said, I think the best definitive therapy for her would be surgical aortic valve replacement combined with coronary bypass surgery.   Sherren Mocha 11/13/2011, 12:14 PM

## 2011-11-13 NOTE — OR Nursing (Signed)
Meal served 

## 2011-11-13 NOTE — H&P (View-Only) (Signed)
HPI:  This is an 78 year old woman presenting for evaluation of aortic stenosis. The patient was seen recently by Dr. Arnoldo Morale, and she was noted to have a change in the characteristics of her heart murmur. An echocardiogram was ordered and demonstrated severe aortic stenosis. She is referred for further evaluation.  She reports chronic dyspnea with exertion that has been present for 2-3 years. There has been slow progression, but no dramatic change. She is dyspneic with low to moderate activity such as walking to her mailbox. She has no resting dyspnea, orthopnea, or PND. She has had some leg edema. She also complains of chest tightness with exertion that resolves with rest. She states this is been present for some time and really has not progressed. Recently, she's had 2 episodes of lightheadedness that occurred with walking. She has never had syncope.  She otherwise has been very healthy over the years. She's had diabetes, hypertension, and hyperlipidemia, all well-controlled. She's never had major surgery or prolonged hospitalization. She lives independently, continues to drive a car, and has an active lifestyle. Her daughter lives in town and is supportive. The patient is widowed.  Outpatient Encounter Prescriptions as of 10/30/2011  Medication Sig Dispense Refill  . Amlodipine-Valsartan-HCTZ (EXFORGE HCT) 5-160-12.5 MG TABS Take by mouth daily.      [name] aspirin 81 MG tablet Take 81 mg by mouth daily.        . bimatoprost (LUMIGAN) 0.03 % ophthalmic drops Place 1 drop into both eyes at bedtime.        . Calcium Carb-Cholecalciferol (CALCIUM 500 +D) 500-400 MG-UNIT TABS Take by mouth daily.        . cholecalciferol (VITAMIN D) 1000 UNITS tablet Take 1,000 Units by mouth daily.        [name] KRILL OIL 1000 MG CAPS Take 1 capsule (1,000 mg total) by mouth 3 (three) times daily.      . Lancets (FREESTYLE) lancets Use as instructed  100 each  3  . metFORMIN (GLUCOPHAGE-XR) 500 MG 24 hr tablet TAKE 1 TABLET  BY MOUTH DAILY WITH BREAKFAST.  30 tablet  11  . Multiple Vitamin (MULTIVITAMIN) tablet Take 1 tablet by mouth daily.        [name] DISCONTD: furosemide (LASIX) 20 MG tablet Take 1 tablet (20 mg total) by mouth 2 (two) times daily.  30 tablet  11  . DISCONTD: olmesartan (BENICAR) 40 MG tablet Take 1 tablet (40 mg total) by mouth daily.        Venlafaxine  Past Medical History  Diagnosis Date  . Hyperlipidemia   . Hypertension   . Osteoporosis   . PMR (polymyalgia rheumatica)   . Diabetes mellitus   . Arthritis   . Carotid stenosis, left     50-60% stable    Past Surgical History  Procedure Date  . Cesarean section   . Appendectomy   . Foot surgery     [name]    History   Social History  . Marital Status: Married    Spouse Name: N/A    Number of Children: N/A  . Years of Education: N/A   Occupational History  . Not on file.   Social History Main Topics  . Smoking status: Never Smoker   . Smokeless tobacco: Never Used  . Alcohol Use: Yes     very seldom  . Drug Use: No  . Sexually Active: Not Currently   Other Topics Concern  . Not on file   Social History Narrative  .  No narrative on file    Family History  Problem Relation Age of Onset  . Lung cancer    . COPD Father   . Cancer Father     lung cancer    ROS: General: no fevers/chills/night sweats Eyes: no blurry vision, diplopia, or amaurosis ENT: no sore throat or hearing loss Resp: no cough, wheezing, or hemoptysis CV: no palpitations, otherwise see history of present illness GI: no abdominal pain, nausea, vomiting, diarrhea, or constipation GU: no dysuria, frequency, or hematuria Skin: no rash Neuro: no headache, numbness, tingling, or weakness of extremities Musculoskeletal: no joint pain or swelling Heme: no bleeding, DVT, or easy bruising Endo: no polydipsia or polyuria  BP 159/83  Ht 5' (1.524 m)  Wt 166 lb (75.297 kg)  BMI 32.42 kg/m2  PHYSICAL EXAM: Pt is alert and oriented, WD,  WN, pleasant elderly woman in no distress. HEENT: normal Neck: JVP normal. Carotid upstrokes normal with bilateral bruits. No thyromegaly. Lungs: equal expansion, clear bilaterally CV: Apex is discrete and nondisplaced, RRR with grade 3/6 crescendo decrescendo murmur best heard at the right upper sternal border with diminished A2  Abd: soft, NT, +BS, no bruit, no hepatosplenomegaly Back: no CVA tenderness Ext: no C/C/E        Femoral pulses 2+= without bruits        DP/PT pulses intact and = Skin: warm and dry without rash Neuro: CNII-XII intact             Strength intact = bilaterally  EKG:  Normal sinus rhythm 94 beats per minute, ST and T wave abnormality consider lateral ischemia.  2-D echo: Study Conclusions  - Left ventricle: The cavity size was normal. Wall thickness was increased in a pattern of moderate LVH. Systolic function was normal. The estimated ejection fraction was in the range of 55% to 60%. Wall motion was normal; there were no regional wall motion abnormalities. Doppler parameters are consistent with abnormal left ventricular relaxation (grade 1 diastolic dysfunction). - Aortic valve: Valve mobility was restricted. There was severe stenosis. Valve area: 0.58cm^2 (Vmax). - Mitral valve: Calcified annulus. The findings are consistent with mild to moderate stenosis. Mild regurgitation. Valve area by pressure half-time: 1.38cm^2. - Left atrium: The atrium was moderately dilated. - Pulmonary arteries: Systolic pressure was mildly increased. PA peak pressure: 55mm Hg (S). - Pericardium, extracardiac: A small pericardial effusion was identified.  Carotid duplex 05/20/2011: Less than 39% right ICA stenosis, 40-59% left ICA stenosis.  STS Risk Calculator: Procedure Name Isolated AVR Risk of Mortality 5.914%  Morbidity or Mortality 25.399%  Long Length of Stay 12.207%  Short Length of Stay 14.624%  Permanent Stroke 3.367%  Prolonged Ventilation 17.914%  DSW  Infection 0.173%  Renal Failure 10.148%  Reoperation 8.660%  ASSESSMENT AND PLAN:

## 2011-11-21 ENCOUNTER — Encounter: Payer: Self-pay | Admitting: Cardiothoracic Surgery

## 2011-11-21 ENCOUNTER — Institutional Professional Consult (permissible substitution) (INDEPENDENT_AMBULATORY_CARE_PROVIDER_SITE_OTHER): Payer: Medicare Other | Admitting: Cardiothoracic Surgery

## 2011-11-21 VITALS — BP 144/73 | HR 88 | Resp 18 | Ht 60.0 in | Wt 164.0 lb

## 2011-11-21 DIAGNOSIS — I359 Nonrheumatic aortic valve disorder, unspecified: Secondary | ICD-10-CM | POA: Diagnosis not present

## 2011-11-21 DIAGNOSIS — I2510 Atherosclerotic heart disease of native coronary artery without angina pectoris: Secondary | ICD-10-CM

## 2011-11-21 DIAGNOSIS — I350 Nonrheumatic aortic (valve) stenosis: Secondary | ICD-10-CM

## 2011-11-21 NOTE — Progress Notes (Signed)
[address]            [place] [address]          [phone]      Nikolette C Langenberg [facility] Medical Record [id number] Date of Birth: 1923-08-20  Referring: Sherren Mocha, MD Primary Care: Georgetta Haber, MD  Chief Complaint:    Chief Complaint  Patient presents with  . Aortic Stenosis    Referral from Dr Burt Knack for eval of severe AS and CAD, Cardiac Cath on 11/13/11, Echo on 09/30/11  . Coronary Artery Disease    History of Present Illness:    Patient is a 78 year old female with a known history of aortic stenosis, having been noted to have a murmur of aortic stenosis in 2003 when she required foot surgery. She now presents with increasing pedal edema chest discomfort with exertion, at least one episode of lightheadedness and room spinning last week. She's had no frank syncope. Echocardiogram in early July showed evidence of his critical aortic stenosis with a peak velocity across the aortic valve 458 cm/s. Cardiac catheterization was performed August 14. The patient is referred to discuss the treatment options for coronary artery disease and critical aortic stenosis.      Current Activity/ Functional Status: Patient is independent with mobility/ambulation, transfers, ADL's, IADL's.   Past Medical History  Diagnosis Date  . Hyperlipidemia   . Hypertension   . Osteoporosis   . PMR (polymyalgia rheumatica)   . Diabetes mellitus   . Arthritis   . Carotid stenosis, left     50-60% stable    Past Surgical History  Procedure Date  . Cesarean section   . Appendectomy   . Foot surgery     [name]    Family History  Problem Relation Age of Onset  . Lung cancer    . COPD Father   . Cancer Father     lung cancer    History   Social History  . Marital Status: Married    Spouse Name: N/A    Number of Children: N/A  . Years of Education: N/A   Occupational History  . Not on file.   Social History Main Topics  .  Smoking status: Never Smoker   . Smokeless tobacco: Never Used  . Alcohol Use: Yes     very seldom  . Drug Use: No  . Sexually Active: Not Currently   Other Topics Concern  . Not on file   Social History Narrative  . No narrative on file    History  Smoking status  . Never Smoker   Smokeless tobacco  . Never Used    History  Alcohol Use  . Yes    very seldom     Allergies  Allergen Reactions  . Statins Other (See Comments)    Muscle aches  . Venlafaxine     REACTION: palpitation    Current Outpatient Prescriptions  Medication Sig Dispense Refill  . Amlodipine-Valsartan-HCTZ (EXFORGE HCT) 5-160-12.5 MG TABS Take by mouth daily.      [name] aspirin 81 MG tablet Take 81 mg by mouth daily.        . bimatoprost (LUMIGAN) 0.03 % ophthalmic drops Place 1 drop into both eyes at bedtime.        . Calcium Carb-Cholecalciferol (CALCIUM 500 +D) 500-400 MG-UNIT TABS Take by mouth daily.        [name]  cholecalciferol (VITAMIN D) 1000 UNITS tablet Take 1,000 Units by mouth daily.        [name] KRILL OIL 1000 MG CAPS Take 1 capsule (1,000 mg total) by mouth 3 (three) times daily.      . metFORMIN (GLUCOPHAGE-XR) 500 MG 24 hr tablet TAKE 1 TABLET BY MOUTH DAILY WITH BREAKFAST.  30 tablet  11  . Multiple Vitamin (MULTIVITAMIN) tablet Take 1 tablet by mouth daily.        . Lancets (FREESTYLE) lancets Use as instructed  100 each  3       Review of Systems:     Cardiac Review of Systems: Y or N  Chest Pain [  y  ]  Resting SOB [n   ] Exertional SOB  Blue.Reese  ]  Orthopnea Florencio.Farrier  ]   Pedal Edema [ y  ]    Palpitations [ n ] Syncope  [n  ]   Presyncope [ y  ]  General Review of Systems: [Y] = yes [  ]=no Constitional: recent weight change [  ]; anorexia [  ]; fatigue [  ]; nausea [  ]; night sweats [  ]; fever [  ]; or chills [  ];                                                                                                                                          Dental: poor dentition[  n]; Last  Dentist visit:   Eye : blurred vision [  ]; diplopia [   ]; vision changes [  ];  Amaurosis fugax[  ]; Resp: cough [n  ];  wheezing[  ];  hemoptysis[  ]; shortness of breath[y  ]; paroxysmal nocturnal dyspnea[ y ]; dyspnea on exertion[ y ]; or orthopnea[y  ];  GI:  gallstones[  ], vomiting[  ];  dysphagia[  ]; melena[ n ];  hematochezia [ n ]; heartburn[  ];   Hx of  Colonoscopy[ ? ]; GU: kidney stones [  ]; hematuria[  ];   dysuria [  ];  nocturia[  ];  history of     obstruction [  ];             Skin: rash, swelling[  ];, hair loss[  ];  peripheral edema[  ];  or itching[  ]; Musculosketetal: myalgias[  ];  joint swelling[  ];  joint erythema[  ];  joint pain[  ];  back pain[  ];  Heme/Lymph: bruising[  ];  bleeding[  ];  anemia[  ];  Neuro: TIA[n  ];  headaches[  n];  stroke[ n ];  vertigo[ y ];  seizures[n  ];   paresthesias[  ];  difficulty walking[  ];  Psych:depression[  ]; anxiety[  ];  Endocrine: diabetes[  ];  thyroid dysfunction[  ];  Immunizations: Flu Leslie.Mor  ]; Pneumococcal[ 2007 ];  Other:  Physical Exam: BP 144/73  Pulse 88  Resp 18  Ht 5' (1.524 m)  Wt 164 lb (74.39 kg)  BMI 32.03 kg/m2  SpO2 96%  General appearance: alert, cooperative, appears stated age and no distress Neurologic: intact Heart: regular rate and rhythm and systolic murmur: holosystolic 4/6, crescendo throughout the precordium Lungs: clear to auscultation bilaterally and normal percussion bilaterally Abdomen: soft, non-tender; bowel sounds normal; no masses,  no organomegaly Extremities: extremities normal, atraumatic, no cyanosis or edema and varicose veins noted Patient has known carotid disease but I do not appreciate carotid bruits he has no cervical supraclavicular adenopathy   Diagnostic Studies & Laboratory data:     Recent Radiology Findings:   No results found.    Recent Lab Findings: Lab Results  Component Value Date   WBC 5.0 11/11/2011   HGB 10.9* 11/11/2011   HCT 32.6* 11/11/2011    PLT 183.0 11/11/2011   GLUCOSE 89 11/13/2011   CHOL 166 03/28/2011   TRIG 175.0* 03/28/2011   HDL 41.10 03/28/2011   LDLDIRECT 171.0 12/26/2010   LDLCALC 90 03/28/2011   ALT 13 03/28/2011   AST 17 03/28/2011   NA 139 11/11/2011   K 4.5 11/11/2011   CL 105 11/11/2011   CREATININE 1.3* 11/11/2011   BUN 31* 11/11/2011   CO2 27 11/11/2011   TSH 1.09 06/18/2010   INR 1.0 11/11/2011   HGBA1C 6.0 11/06/2011   ECHO:Study Conclusions  - Left ventricle: The cavity size was normal. Wall thickness was increased in a pattern of moderate LVH. Systolic function was normal. The estimated ejection fraction was in the range of 55% to 60%. Wall motion was normal; there were no regional wall motion abnormalities. Doppler parameters are consistent with abnormal left ventricular relaxation (grade 1 diastolic dysfunction). - Aortic valve: Valve mobility was restricted. There was severe stenosis. Valve area: 0.58cm^2 (Vmax). - Mitral valve: Calcified annulus. The findings are consistent with mild to moderate stenosis. Mild regurgitation. Valve area by pressure half-time: 1.38cm^2. - Left atrium: The atrium was moderately dilated. - Pulmonary arteries: Systolic pressure was mildly increased. PA peak pressure: 3mm Hg (S). - Pericardium, extracardiac: A small pericardial effusion was identified.  CATH: Procedural Findings:  Hemodynamics  RA mean of 3  RV 43/5  PA 42/15 with a mean of 26  PCWP mean of 13  LV not recorded  AO 119/50 with a mean of 78  Oxygen saturations:  PA 63  AO 93  Cardiac Output (Fick) 4.9  Cardiac Index (Fick) 2.8  Coronary angiography:  Coronary dominance: right  Left mainstem: Patent with no obstructive disease  Left anterior descending (LAD): The LAD courses to the left ventricular apex. There is mild plaque in the proximal LAD with associated calcification. I would estimate the percent stenosis in the 40-50 range. The mid LAD has mild diffuse irregularity without  significant stenosis. The first septal perforator is fairly large with no significant disease. The first diagonal is of small caliber with 70% ostial stenosis the  Left circumflex (LCx): The left circumflex is a large vessel. There are 2 large obtuse marginal branches. The mid vessel before the OM branches has a smooth 70% stenosis. There is no other significant disease seen throughout the distribution of the left circumflex the  Right coronary artery (RCA): This is a dominant vessel. The proximal and mid vessel are diffusely diseased beyond the first RV marginal branch. There is 60-70% proximal stenosis and 80% mid stenosis. The distal vessel is smooth and  appearance and has no significant disease. The PDA and PLA branches are patent.  Left ventriculography: Not done  Final Conclusions:  1. Known severe aortic stenosis with fairly well-maintained intracardiac hemodynamics  2. Multivessel coronary artery disease with severe stenosis of the mid right coronary artery, moderately severe stenosis of the left circumflex, and nonobstructive disease of the LAD.  Recommendations: Difficult situation in this 78 year old woman. She is not eager to pursue aortic valve surgery. However, she clearly has severe symptomatic aortic stenosis. I am going to send her for a cardiac surgery consultation if she is willing. She could be considered for transcatheter aortic valve replacement, but her STS score puts are only in a moderate risk category and she would have to be done through a clinical trial. Her coronary disease could be treated percutaneously and I would probably use pressure wire analysis to direct coronary stenting. With that said, I think the best definitive therapy for her would be surgical aortic valve replacement combined with coronary bypass surgery.  Sherren Mocha  11/13/2011, 12:14 PM   Assessment / Plan:      Patient with symptomatic critical aortic stenosis and coronary occlusive disease at age 40.  She is a very aware of her clinical situation.  I reviewed with her and her daughter the risks and options of aortic valve replacement and coronary artery bypass grafting, estimates using the STS database include an operative mortality of 6.7% morbidity of 32% long length of stay 17% stroke 4.5%. In seeing the patient and discussing the options she appears to be a reasonable candidate for standard aortic valve replacement and coronary artery bypass grafting even with the recognized increased risk primarily because of age.   In addition I discussed with the patient and her daughter with critical aortic stenosis and episodes of lightheadedness she should not be driving an automobile.  The patient could be considered for [name] with coronary artery stenting.  The patient would like to think over her options and will return to see me in 2 weeks to make a final decision.    Grace Isaac MD  Beeper [phone] Office [phone] 11/21/2011 5:27 PM

## 2011-11-21 NOTE — Patient Instructions (Addendum)
Aortic Valve Replacement You have a disease of one of the valves of your heart. In you or your child's case, it is the aortic valve which needs replacing. Aortic valve replacement is open heart surgery done by a heart surgeon. This operation treats problems with the aortic valve. The aortic valve is the "outflow valve" for the left side of the heart. The left side of your heart (left ventricle) is the large muscular part of the heart that pumps blood to the rest of the body. It separates the left ventricle from the aorta. When the heart squeezes down (contracts), the aortic valve is what keeps the blood from flowing back into the ventricle from the aorta. This allows the blood to keep moving through the body.   Surgery may be necessary when the valve does not open or close completely. A stenotic (narrow) valve does not let the blood leave the heart normally. This causes blood to back up in the left ventricle. This makes it hard for the heart to increase the amount of blood that it pumps. The heart has to work harder. This may produce shortness of breath and fatigue. Problems are worse with activity.   If the valve leaflets do not meet correctly when closing, blood may leak backward into the ventricle each time the heart pumps. This is called aortic insufficiency. When some of the blood leaks backwards, the heart has to work even harder. The heart can allow for this over-work for a long time if the leakage came on slowly. Eventually, the heart fails.   Aortic valve problems may be caused by a birth defect. This is called congenital. Wear and tear can cause valves to fail. More commonly, rheumatic fever may damage the aortic valve. Occasionally, the valve may be damaged by infection. This also causes the aortic valve to leak.   DESCRIPTION OF SURGERY Aortic valves can be repaired. When the valve is too damaged to repair, the valve must be replaced. A prosthetic (artificial) valve is used to do this. Valves  damaged by rheumatic disease often must be replaced.   Two types of artificial valves are available:  Mechanical valves made entirely from man-made materials.   Biological valves which are made from animal tissues or taken from a cadaver.  Each has advantages and disadvantages. The choice of which type to use should be made by you and your surgeon. Your risks, age, lifestyle, other medical problems including the decision on whether to be on blood thinners the rest of your life all will help you decide on which type of valve to use. There are a number of good MECHANICAL PROSTHESES available. All work well. The main advantage of mechanical valves is that they do not wear out. Their main disadvantage is that blood clots easier on mechanical valves. If this happens the valve will not work normally. Because of this, patients with mechanical valves must take anticoagulants (blood thinners) for life. There is also a small but definite risk of blood clots causing stroke, even when taking anticoagulants.   There are a number of BIOLOGICAL CHOICES for aortic valve replacement. Most are made from pig aortic valves. Some are taken from cadavers. The main advantage is that they have a reduced risk of blood clots forming on the valve. This lessens the chance of the valve not working or causing a stroke. A large disadvantage of biological or tissue valves is that they wear out sooner than mechanical valves. The rate at which they wear out depends  on the patient's age. A young boy might wear out such a valve in only a few years. The same valve might last 10 years in a middle aged person, and even longer in a patient over the age of 90. A tissue valve used in a person over 73 years old may never need replacement. RISKS AND COMPLICATIONS Your cardiologist and cardiothoracic surgeon can best determine your individual risk. It will depend on your age, general condition, medical conditions, and your heart function. In general,  the risks include:  Problems from the operation itself are low risk. Some common risks are:   Risks from the anesthesia.   Bleeding and infection.   Lifelong treatment with medications to prevent blood clots is needed for mechanical valve replacements.   Infection is more common with valve replacement than with valve repair.   Valve failure is more common with valve replacement than with valve repair. Pig valves tend to fail after about 8 to 10 years.  PROCEDURE   Valve repair or replacement is open-heart surgery. You are given general anesthesia (medications to help you sleep). You are then placed on a heart-lung machine. This machine provides oxygen to your blood while the heart is not working. The surgery generally lasts from 3 to 5 hours. During surgery, the surgeon makes a large incision (cut) in the chest. Sometimes the heart is cooled to slow or stop the heartbeat. The damaged aortic valve is either repaired or removed and replaced with an artificial heart valve. AFTER THE PROCEDURE  Recovery from heart valve surgery usually involves a few days in an intensive care unit (ICU) of a hospital. Full recovery from heart valve surgery can take several months.   Anticoagulation (blood thinning) treatment with warfarin is often prescribed for 6 weeks to 3 months after surgery for those with biological valves. It is prescribed for life for those with mechanical valves.   Recovery includes healing of the surgical incision. There is a gradual building of stamina and exercise abilities. An exercise program under the direction of a physical therapist may be recommended.   Once you have an artificial valve, your heart function and your life will return to normal. You usually feel better after surgery. Shortness of breath and fatigue should lessen. If your heart was already severely damaged before your surgery, you may continue to have problems.   You can usually resume most of your normal  activities. You will have to continue to monitor your condition. You need to watch out for blood clots and infections.   Artificial valves need to be replaced after a period of time. It is important that you see your caregiver regularly.   Some individuals with an aortic valve replacement need to take antibiotics before having dental work or other surgical procedures. This is called prophylactic antibiotic treatment. These drugs help to prevent infective endocarditis. Antibiotics are only recommended for individuals with the highest risk for developing infective endocarditis. Let your dentist and your caregiver know if you have a history of any of the following so that the necessary precautions can be taken:   A VSD.   A repaired VSD.   Endocarditis in the past.   An artificial (prosthetic) heart valve.  HOME CARE INSTRUCTIONS    Use all medications as prescribed.   Take your temperature every morning for the first week after surgery. Record these.   Weigh yourself every morning for at least the first week after surgery and record.   Do not lift  more than 10 pounds (4.5 kg) until your sternum (breastbone) has healed. Avoid all activities which would place strain on your incision.   You may shower but do not take baths until instructed by your caregivers.   Avoid driving for 4 to 6 weeks following surgery or as instructed.   Use your elastic stockings during the day. You should wear the stockings for at least 2 weeks after discharge or longer if your ankles are swollen. The stockings help blood flow and help reduce swelling in the legs. It is easiest to put the stockings on before you get out of bed in the morning. They should fit snugly.  SEEK IMMEDIATE MEDICAL CARE IF:  You develop chest pain which is not coming from your incision (surgical cut) .   You develop shortness of breath.   You develop a temperature over 101 F (38.3 C).   You have a sudden weight gain. Let your  caregiver know what the weight gain is.  Document Released: 08/07/2004 Document Revised: 03/07/2011 Document Reviewed: 03/14/2008 [facility] Patient Information 2012 [place].

## 2011-11-26 ENCOUNTER — Telehealth: Payer: Self-pay | Admitting: Cardiovascular Disease

## 2011-11-26 NOTE — Telephone Encounter (Signed)
Called and left message. Patient will call back.

## 2011-11-26 NOTE — Telephone Encounter (Signed)
I spoke with the pt and she is still concerned about which way to proceed in regards to AS.  The pt was wondering if she can be referred to [facility] for a second opinion. The pt said that Dr Burt Knack had mentioned having her see an MD at [facility].  I will speak with Dr Burt Knack about this pt.

## 2011-11-26 NOTE — Telephone Encounter (Signed)
Please return call to pt [phone], regarding previously discussed procedure.

## 2011-11-27 ENCOUNTER — Ambulatory Visit: Payer: Medicare Other | Admitting: Internal Medicine

## 2011-12-04 NOTE — Telephone Encounter (Signed)
The pt will be referred to Dr Albertine Patricia at [facility].  Records are being sent to [facility].

## 2011-12-05 ENCOUNTER — Ambulatory Visit: Payer: Medicare Other | Admitting: Cardiothoracic Surgery

## 2011-12-17 DIAGNOSIS — Z01811 Encounter for preprocedural respiratory examination: Secondary | ICD-10-CM | POA: Diagnosis not present

## 2011-12-17 DIAGNOSIS — I10 Essential (primary) hypertension: Secondary | ICD-10-CM | POA: Diagnosis not present

## 2011-12-17 DIAGNOSIS — I359 Nonrheumatic aortic valve disorder, unspecified: Secondary | ICD-10-CM | POA: Diagnosis not present

## 2011-12-17 DIAGNOSIS — I700 Atherosclerosis of aorta: Secondary | ICD-10-CM | POA: Diagnosis not present

## 2011-12-17 DIAGNOSIS — Z79899 Other long term (current) drug therapy: Secondary | ICD-10-CM | POA: Diagnosis not present

## 2011-12-17 DIAGNOSIS — N189 Chronic kidney disease, unspecified: Secondary | ICD-10-CM | POA: Diagnosis not present

## 2011-12-17 DIAGNOSIS — I2510 Atherosclerotic heart disease of native coronary artery without angina pectoris: Secondary | ICD-10-CM | POA: Diagnosis not present

## 2011-12-20 ENCOUNTER — Telehealth: Payer: Self-pay | Admitting: Cardiovascular Disease

## 2011-12-20 NOTE — Telephone Encounter (Signed)
I have spoken with Dr. Albertine Patricia about [name]. She has been evaluated for transcatheter aortic valve replacement at [facility]. We are going to evaluate her coronaries with pressure wire analysis. I called the patient to discuss this recommendation and left a message on her voicemail. She is going to call back early next week but we should try to set up an office visit to discuss further.

## 2011-12-23 ENCOUNTER — Ambulatory Visit (INDEPENDENT_AMBULATORY_CARE_PROVIDER_SITE_OTHER): Payer: Medicare Other | Admitting: Cardiovascular Disease

## 2011-12-23 ENCOUNTER — Encounter: Payer: Self-pay | Admitting: Cardiovascular Disease

## 2011-12-23 VITALS — BP 125/70 | HR 92 | Ht 60.0 in | Wt 166.4 lb

## 2011-12-23 DIAGNOSIS — I2510 Atherosclerotic heart disease of native coronary artery without angina pectoris: Secondary | ICD-10-CM

## 2011-12-23 DIAGNOSIS — I359 Nonrheumatic aortic valve disorder, unspecified: Secondary | ICD-10-CM

## 2011-12-23 MED ORDER — CLOPIDOGREL BISULFATE 75 MG PO TABS: 75.0000 mg | ORAL_TABLET | Freq: Every day | ORAL | Status: DC

## 2011-12-23 NOTE — Patient Instructions (Addendum)
Your physician has requested that you have a cardiac catheterization. Cardiac catheterization is used to diagnose and/or treat various heart conditions. Doctors may recommend this procedure for a number of different reasons. The most common reason is to evaluate chest pain. Chest pain can be a symptom of coronary artery disease (CAD), and cardiac catheterization can show whether plaque is narrowing or blocking your heart's arteries. This procedure is also used to evaluate the valves, as well as measure the blood flow and oxygen levels in different parts of your heart. For further information please visit [email or web address]. Please follow instruction sheet, as given.  Your physician has recommended you make the following change in your medication: START Plavix 75mg  take one by mouth daily

## 2011-12-23 NOTE — Telephone Encounter (Signed)
The pt is scheduled to see Dr Burt Knack on 12/23/11.

## 2011-12-23 NOTE — Progress Notes (Signed)
HPI:  Sara Garza is a delightful 78 year old woman returns for followup evaluation regarding her severe aortic stenosis. She underwent cardiac catheterization 11/13/2011. This demonstrated two-vessel coronary artery disease involving the right coronary artery and left circumflex vessels. Her LAD had nonobstructive disease. She subsequently underwent surgical evaluation by Dr. Servando Snare, but she has not wanted to proceed with open aortic valve replacement and coronary bypass surgery. She was referred to [facility] for consideration of [name] through a 'moderate-risk' clinical trial. I received a call from Dr. Albertine Patricia, and the patient is a candidate for the SURTAVI Trial randomizing moderate risk patients (STS >4) to open AVR versus [name]. She returns today for further discussion.  She reports no change in her symptoms. She continues to have substernal chest pressure with walking short distance to her mailbox. She has not had resting angina or shortness of breath. She has stable shortness of breath with exertion. She denies syncope but does experience lightheadedness. None of her symptoms have changed since I last saw her in August.  Outpatient Encounter Prescriptions as of 12/23/2011  Medication Sig Dispense Refill  . Amlodipine-Valsartan-HCTZ (EXFORGE HCT) 5-160-12.5 MG TABS Take by mouth daily.      [name] aspirin 81 MG tablet Take 81 mg by mouth daily.        . bimatoprost (LUMIGAN) 0.03 % ophthalmic drops Place 1 drop into both eyes at bedtime.        . Calcium Carb-Cholecalciferol (CALCIUM 500 +D) 500-400 MG-UNIT TABS Take by mouth daily.        . cholecalciferol (VITAMIN D) 1000 UNITS tablet Take 1,000 Units by mouth daily.        [name] KRILL OIL 1000 MG CAPS Take 1 capsule (1,000 mg total) by mouth 3 (three) times daily.      . Lancets (FREESTYLE) lancets Use as instructed  100 each  3  . metFORMIN (GLUCOPHAGE-XR) 500 MG 24 hr tablet TAKE 1 TABLET BY MOUTH DAILY WITH BREAKFAST.  30 tablet  11    . Multiple Vitamin (MULTIVITAMIN) tablet Take 1 tablet by mouth daily.        . clopidogrel (PLAVIX) 75 MG tablet Take 1 tablet (75 mg total) by mouth daily.  30 tablet  11    Allergies  Allergen Reactions  . Statins Other (See Comments)    Muscle aches  . Venlafaxine     REACTION: palpitation    Past Medical History  Diagnosis Date  . Hyperlipidemia   . Hypertension   . Osteoporosis   . PMR (polymyalgia rheumatica)   . Diabetes mellitus   . Arthritis   . Carotid stenosis, left     50-60% stable    ROS: Negative except as per HPI  BP 125/70  Pulse 92  Ht 5' (1.524 m)  Wt 75.479 kg (166 lb 6.4 oz)  BMI 32.50 kg/m2  SpO2 98%  PHYSICAL EXAM: Pt is alert and oriented, very pleasant elderly woman in NAD Remainder of physical exam was deferred as our time was spent in discussion.  ASSESSMENT AND PLAN: Severe symptomatic aortic stenosis and two-vessel coronary artery disease. She remains extremely reluctant to consider open surgery, and understands that she cannot undergo transcatheter aortic valve replacement outside of a clinical trial at least in the [place]. Despite her age of 76 years, her surgical risk is not prohibitive and she is not a candidate for a commercial transcatheter aortic valve. She understands that if she were to enroll in the SURTAVI Trial, she  will be randomized to either open surgery or transcatheter valve replacement and she would have a 50% chance of either therapy. Before enrollment in the trial, she would need to undergo treatment of her coronary artery disease. I have discussed this with Dr. Albertine Patricia, and I will plan on performing pressure wire guided PCI of the left circumflex and right coronary arteries using a bare-metal stent platforms. I have again reviewed her angiogram, and I am not sure that the circumflex will be hemodynamically significant, but I suspect the right coronary artery will show hemodynamic significance and will require treatment.  I have reviewed the risks, indications, and alternatives to PCI with this patient and specifically reviewed these issues as they relate to her severe aortic stenosis. She understands these issues and agrees to proceed. She will be started on Plavix 75 mg daily and will continue on aspirin 81 mg daily. Will discuss further treatment options with her at the time of PCI.

## 2011-12-24 ENCOUNTER — Encounter (HOSPITAL_COMMUNITY): Payer: Self-pay | Admitting: Pharmacy Technician

## 2011-12-24 ENCOUNTER — Ambulatory Visit (INDEPENDENT_AMBULATORY_CARE_PROVIDER_SITE_OTHER): Payer: Medicare Other

## 2011-12-24 DIAGNOSIS — Z23 Encounter for immunization: Secondary | ICD-10-CM | POA: Diagnosis not present

## 2011-12-30 ENCOUNTER — Other Ambulatory Visit: Payer: Self-pay | Admitting: Cardiovascular Disease

## 2011-12-31 ENCOUNTER — Other Ambulatory Visit (INDEPENDENT_AMBULATORY_CARE_PROVIDER_SITE_OTHER): Payer: Medicare Other

## 2011-12-31 DIAGNOSIS — I2510 Atherosclerotic heart disease of native coronary artery without angina pectoris: Secondary | ICD-10-CM | POA: Diagnosis not present

## 2011-12-31 DIAGNOSIS — I359 Nonrheumatic aortic valve disorder, unspecified: Secondary | ICD-10-CM | POA: Diagnosis not present

## 2011-12-31 DIAGNOSIS — E785 Hyperlipidemia, unspecified: Secondary | ICD-10-CM | POA: Diagnosis not present

## 2011-12-31 LAB — CBC WITH DIFFERENTIAL/PLATELET
Basophils Relative: 0.7 % (ref 0.0–3.0)
Eosinophils Relative: 3.9 % (ref 0.0–5.0)
HCT: 32.1 % — ABNORMAL LOW (ref 36.0–46.0)
Hemoglobin: 10.6 g/dL — ABNORMAL LOW (ref 12.0–15.0)
Lymphs Abs: 1.1 10*3/uL (ref 0.7–4.0)
MCV: 91.4 fl (ref 78.0–100.0)
Monocytes Absolute: 0.5 10*3/uL (ref 0.1–1.0)
Neutro Abs: 3.2 10*3/uL (ref 1.4–7.7)
Neutrophils Relative %: 63.6 % (ref 43.0–77.0)
RBC: 3.52 Mil/uL — ABNORMAL LOW (ref 3.87–5.11)
WBC: 5 10*3/uL (ref 4.5–10.5)

## 2011-12-31 LAB — BASIC METABOLIC PANEL
CO2: 22 mEq/L (ref 19–32)
Chloride: 107 mEq/L (ref 96–112)
Potassium: 4.3 mEq/L (ref 3.5–5.1)
Sodium: 137 mEq/L (ref 135–145)

## 2011-12-31 LAB — LIPID PANEL
Total CHOL/HDL Ratio: 7
VLDL: 30.6 mg/dL (ref 0.0–40.0)

## 2011-12-31 LAB — PROTIME-INR: INR: 1 ratio (ref 0.8–1.0)

## 2012-01-01 ENCOUNTER — Encounter: Payer: Self-pay | Admitting: Cardiovascular Disease

## 2012-01-01 NOTE — Telephone Encounter (Signed)
This encounter was created in error - please disregard.

## 2012-01-01 NOTE — Telephone Encounter (Signed)
Pt returning nurse call, she can be reached at [phone]

## 2012-01-02 ENCOUNTER — Ambulatory Visit (HOSPITAL_COMMUNITY)
Admission: RE | Admit: 2012-01-02 | Discharge: 2012-01-02 | Disposition: A | Discharge status: Home / Self Care | Payer: Medicare Other | Source: Ambulatory Visit | Attending: Cardiovascular Disease | Admitting: Cardiovascular Disease

## 2012-01-02 ENCOUNTER — Encounter (HOSPITAL_COMMUNITY)
Admission: RE | Disposition: A | Discharge status: Home / Self Care | Payer: Self-pay | Source: Ambulatory Visit | Attending: Cardiovascular Disease

## 2012-01-02 DIAGNOSIS — E785 Hyperlipidemia, unspecified: Secondary | ICD-10-CM | POA: Diagnosis not present

## 2012-01-02 DIAGNOSIS — I2510 Atherosclerotic heart disease of native coronary artery without angina pectoris: Secondary | ICD-10-CM | POA: Diagnosis not present

## 2012-01-02 DIAGNOSIS — I359 Nonrheumatic aortic valve disorder, unspecified: Secondary | ICD-10-CM | POA: Diagnosis not present

## 2012-01-02 DIAGNOSIS — I6529 Occlusion and stenosis of unspecified carotid artery: Secondary | ICD-10-CM | POA: Diagnosis not present

## 2012-01-02 DIAGNOSIS — M810 Age-related osteoporosis without current pathological fracture: Secondary | ICD-10-CM | POA: Insufficient documentation

## 2012-01-02 DIAGNOSIS — M353 Polymyalgia rheumatica: Secondary | ICD-10-CM | POA: Diagnosis not present

## 2012-01-02 DIAGNOSIS — E119 Type 2 diabetes mellitus without complications: Secondary | ICD-10-CM | POA: Insufficient documentation

## 2012-01-02 DIAGNOSIS — I10 Essential (primary) hypertension: Secondary | ICD-10-CM | POA: Insufficient documentation

## 2012-01-02 HISTORY — PX: PERCUTANEOUS CORONARY STENT INTERVENTION (PCI-S): SHX5485

## 2012-01-02 LAB — POCT ACTIVATED CLOTTING TIME: Activated Clotting Time: 354 seconds

## 2012-01-02 LAB — POCT I-STAT, CHEM 8
Chloride: 106 mEq/L (ref 96–112)
Glucose, Bld: 111 mg/dL — ABNORMAL HIGH (ref 70–99)
HCT: 32 % — ABNORMAL LOW (ref 36.0–46.0)
Potassium: 4.3 mEq/L (ref 3.5–5.1)
Sodium: 138 mEq/L (ref 135–145)

## 2012-01-02 SURGERY — PERCUTANEOUS CORONARY STENT INTERVENTION (PCI-S)
Anesthesia: LOCAL

## 2012-01-02 MED ORDER — BIVALIRUDIN 250 MG IV SOLR
INTRAVENOUS | Status: AC
  Filled 2012-01-02: qty 250

## 2012-01-02 MED ORDER — LIDOCAINE HCL (PF) 1 % IJ SOLN
INTRAMUSCULAR | Status: AC
  Filled 2012-01-02: qty 30

## 2012-01-02 MED ORDER — SODIUM CHLORIDE 0.9 % IV SOLN: 250.0000 mL | INTRAVENOUS | Status: DC | PRN

## 2012-01-02 MED ORDER — NITROGLYCERIN 0.2 MG/ML ON CALL CATH LAB
INTRAVENOUS | Status: AC
  Filled 2012-01-02: qty 1

## 2012-01-02 MED ORDER — ADENOSINE 12 MG/4ML IV SOLN
12.0000 mL | Freq: Once | INTRAVENOUS | Status: DC
  Filled 2012-01-02: qty 12

## 2012-01-02 MED ORDER — ASPIRIN 81 MG PO CHEW
324.0000 mg | CHEWABLE_TABLET | ORAL | Status: AC
  Administered 2012-01-02: 243 mg via ORAL

## 2012-01-02 MED ORDER — DIAZEPAM 5 MG PO TABS
ORAL_TABLET | ORAL | Status: AC
  Filled 2012-01-02: qty 1

## 2012-01-02 MED ORDER — VERAPAMIL HCL 2.5 MG/ML IV SOLN
INTRAVENOUS | Status: AC
  Filled 2012-01-02: qty 2

## 2012-01-02 MED ORDER — SODIUM CHLORIDE 0.9 % IV SOLN: 1.0000 mL/kg/h | INTRAVENOUS | Status: DC

## 2012-01-02 MED ORDER — DIAZEPAM 5 MG PO TABS
5.0000 mg | ORAL_TABLET | ORAL | Status: AC
  Administered 2012-01-02: 5 mg via ORAL

## 2012-01-02 MED ORDER — HEPARIN (PORCINE) IN NACL 2-0.9 UNIT/ML-% IJ SOLN
INTRAMUSCULAR | Status: AC
  Filled 2012-01-02: qty 1500

## 2012-01-02 MED ORDER — MIDAZOLAM HCL 2 MG/2ML IJ SOLN
INTRAMUSCULAR | Status: AC
  Filled 2012-01-02: qty 2

## 2012-01-02 MED ORDER — ONDANSETRON HCL 4 MG/2ML IJ SOLN: 4.0000 mg | Freq: Four times a day (QID) | INTRAMUSCULAR | Status: DC | PRN

## 2012-01-02 MED ORDER — SODIUM CHLORIDE 0.9 % IV SOLN
INTRAVENOUS | Status: DC
  Administered 2012-01-02: 1000 mL via INTRAVENOUS

## 2012-01-02 MED ORDER — SODIUM CHLORIDE 0.9 % IJ SOLN: 3.0000 mL | INTRAMUSCULAR | Status: DC | PRN

## 2012-01-02 MED ORDER — CLOPIDOGREL BISULFATE 75 MG PO TABS: 75.0000 mg | ORAL_TABLET | ORAL | Status: DC

## 2012-01-02 MED ORDER — SODIUM CHLORIDE 0.9 % IJ SOLN: 3.0000 mL | Freq: Two times a day (BID) | INTRAMUSCULAR | Status: DC

## 2012-01-02 MED ORDER — FENTANYL CITRATE 0.05 MG/ML IJ SOLN
INTRAMUSCULAR | Status: AC
  Filled 2012-01-02: qty 2

## 2012-01-02 MED ORDER — ACETAMINOPHEN 325 MG PO TABS
650.0000 mg | ORAL_TABLET | ORAL | Status: DC | PRN
  Administered 2012-01-02: 650 mg via ORAL
  Filled 2012-01-02: qty 2

## 2012-01-02 MED ORDER — ASPIRIN 81 MG PO CHEW
CHEWABLE_TABLET | ORAL | Status: AC
  Filled 2012-01-02: qty 4

## 2012-01-02 NOTE — H&P (View-Only) (Signed)
HPI:  Ms. Scites is a delightful 78 year old woman returns for followup evaluation regarding her severe aortic stenosis. She underwent cardiac catheterization 11/13/2011. This demonstrated two-vessel coronary artery disease involving the right coronary artery and left circumflex vessels. Her LAD had nonobstructive disease. She subsequently underwent surgical evaluation by Dr. Servando Snare, but she has not wanted to proceed with open aortic valve replacement and coronary bypass surgery. She was referred to [facility] for consideration of [name] through a 'moderate-risk' clinical trial. I received a call from Dr. Albertine Patricia, and the patient is a candidate for the SURTAVI Trial randomizing moderate risk patients (STS >4) to open AVR versus [name]. She returns today for further discussion.  She reports no change in her symptoms. She continues to have substernal chest pressure with walking short distance to her mailbox. She has not had resting angina or shortness of breath. She has stable shortness of breath with exertion. She denies syncope but does experience lightheadedness. None of her symptoms have changed since I last saw her in August.  Outpatient Encounter Prescriptions as of 12/23/2011  Medication Sig Dispense Refill  . Amlodipine-Valsartan-HCTZ (EXFORGE HCT) 5-160-12.5 MG TABS Take by mouth daily.      [name] aspirin 81 MG tablet Take 81 mg by mouth daily.        . bimatoprost (LUMIGAN) 0.03 % ophthalmic drops Place 1 drop into both eyes at bedtime.        . Calcium Carb-Cholecalciferol (CALCIUM 500 +D) 500-400 MG-UNIT TABS Take by mouth daily.        . cholecalciferol (VITAMIN D) 1000 UNITS tablet Take 1,000 Units by mouth daily.        [name] KRILL OIL 1000 MG CAPS Take 1 capsule (1,000 mg total) by mouth 3 (three) times daily.      . Lancets (FREESTYLE) lancets Use as instructed  100 each  3  . metFORMIN (GLUCOPHAGE-XR) 500 MG 24 hr tablet TAKE 1 TABLET BY MOUTH DAILY WITH BREAKFAST.  30 tablet  11    . Multiple Vitamin (MULTIVITAMIN) tablet Take 1 tablet by mouth daily.        . clopidogrel (PLAVIX) 75 MG tablet Take 1 tablet (75 mg total) by mouth daily.  30 tablet  11    Allergies  Allergen Reactions  . Statins Other (See Comments)    Muscle aches  . Venlafaxine     REACTION: palpitation    Past Medical History  Diagnosis Date  . Hyperlipidemia   . Hypertension   . Osteoporosis   . PMR (polymyalgia rheumatica)   . Diabetes mellitus   . Arthritis   . Carotid stenosis, left     50-60% stable    ROS: Negative except as per HPI  BP 125/70  Pulse 92  Ht 5' (1.524 m)  Wt 75.479 kg (166 lb 6.4 oz)  BMI 32.50 kg/m2  SpO2 98%  PHYSICAL EXAM: Pt is alert and oriented, very pleasant elderly woman in NAD Remainder of physical exam was deferred as our time was spent in discussion.  ASSESSMENT AND PLAN: Severe symptomatic aortic stenosis and two-vessel coronary artery disease. She remains extremely reluctant to consider open surgery, and understands that she cannot undergo transcatheter aortic valve replacement outside of a clinical trial at least in the [place]. Despite her age of 78 years, her surgical risk is not prohibitive and she is not a candidate for a commercial transcatheter aortic valve. She understands that if she were to enroll in the SURTAVI Trial, she  will be randomized to either open surgery or transcatheter valve replacement and she would have a 50% chance of either therapy. Before enrollment in the trial, she would need to undergo treatment of her coronary artery disease. I have discussed this with Dr. Albertine Patricia, and I will plan on performing pressure wire guided PCI of the left circumflex and right coronary arteries using a bare-metal stent platforms. I have again reviewed her angiogram, and I am not sure that the circumflex will be hemodynamically significant, but I suspect the right coronary artery will show hemodynamic significance and will require treatment.  I have reviewed the risks, indications, and alternatives to PCI with this patient and specifically reviewed these issues as they relate to her severe aortic stenosis. She understands these issues and agrees to proceed. She will be started on Plavix 75 mg daily and will continue on aspirin 81 mg daily. Will discuss further treatment options with her at the time of PCI.

## 2012-01-02 NOTE — Interval H&P Note (Signed)
History and Physical Interval Note:  01/02/2012 9:28 AM  [place]  has presented today for surgery, with the diagnosis of CAD  The various methods of treatment have been discussed with the patient and family. After consideration of risks, benefits and other options for treatment, the patient has consented to  Procedure(s) (LRB) with comments: PERCUTANEOUS CORONARY STENT INTERVENTION (PCI-S) (N/A) as a surgical intervention .  The patient's history has been reviewed, patient examined, no change in status, stable for surgery.  I have reviewed the patient's chart and labs.  Questions were answered to the patient's satisfaction.     Sherren Mocha

## 2012-01-02 NOTE — CV Procedure (Signed)
   Cardiac Catheterization Procedure Note  Name: Sara Garza MRN: [name] DOB: 1923-11-20  Procedure: Pressure wire analysis of the left circumflex, pressure wire analysis of the right coronary artery, Perclose of the right femoral artery.  Indication: 78 year-old woman with critical aortic stenosis. She has moderately tight 2 vessel coronary artery disease with a 70% stenosis in the mid left circumflex and a 75-80% stenosis in the mid right coronary artery. She's being considered for either open or transcatheter aortic valve replacement and she presents today for pressure wire analysis and possible percutaneous intervention of her coronary lesions.  Procedural Details: The right wrist was prepped, draped, and anesthetized with 1% lidocaine. Using the modified Seldinger technique, a 6 French sheath was introduced into the right radial artery. 3 mg of verapamil was administered through the sheath. Initially, a Versacore wire was advanced through the sheath with an XB LAD 3.5 cm guide catheter. There was full 360 loop in the right subclavian artery. I was able to navigate the loop but was never able to cannulate the left coronary artery. I tried multiple guide catheters, both 5 and 6 [place]. I also tried using the support of a Rosen wire. I was never able to straighten out the loop, nor was I able to place the guide catheter in the left cusp. I elected to convert to a femoral case.  The right groin was prepped, draped, and anesthetized with 1% lidocaine. Using the modified Seldinger technique, a 6 French sheath was introduced into the right femoral artery. An XB LAD 3.5 cm guide catheter was inserted. Initial angiography confirmed a moderate lesion in the mid circumflex just before the bifurcation of 2 marginal branches. The pressure wire was normalized at the guide tip. The wire was then advanced beyond the area of stenosis. A therapeutic ACT had been confirmed and bivalirudin was used for  anticoagulation. The patient was adequately pre-loaded with clopidogrel. Intravenous adenosine was administered and at peak hyperemia the FFR range between 1.0 and 0.95. Attention was then turned to the right coronary artery. The guide catheter was changed out to a 6 [place] JR 4. This did not fit into the right coronary artery and I again changed out for a 6 [place] 3-D RC catheter. The pressure wire was again normalized at the end of the guide catheter. The wire was advanced beyond the area of moderately severe stenosis in the mid vessel. Intravenous adenosine was started again and with peak hyperemia the FFR was 0.82. As this did not meet the threshold for significant ischemia, the procedure was completed. A Perclose device was used for femoral hemostasis. A TR band was used for radial hemostasis. The patient tolerated the entire procedure well and there were no immediate complications. She will be transferred back to the short stay area and discharged home later this afternoon.  Final Conclusions:  Negative pressure wire analysis of the left circumflex and right coronary artery  Recommendations: Medical therapy for coronary artery disease. The patient will return to [facility] for consideration of transcatheter aortic valve replacement versus open surgery via the SURTAVI Protocol.  Sherren Mocha 01/02/2012, 11:03 AM

## 2012-01-03 MED FILL — Dextrose Inj 5%: INTRAVENOUS | Qty: 50 | Status: AC

## 2012-01-10 ENCOUNTER — Telehealth: Payer: Self-pay | Admitting: Cardiovascular Disease

## 2012-01-10 NOTE — Telephone Encounter (Signed)
I spoke with the pt and she is still having pain in her arm from cardiac cath site.  The pt said she had a lot of bruising with the procedure.  The bruising is a dark purple with yellowish border.  The pt has been using heat on this area.  The pt said she also has a knot the size of her finger tip at cath insertion site. I made the pt aware that the bruising his healing and that she can try hot/cold compresses on arm. Dr Burt Knack aware of this information and no further recommendation at this time.

## 2012-01-10 NOTE — Telephone Encounter (Signed)
New problem:  Would like to speak with lauren no information was given

## 2012-01-16 ENCOUNTER — Ambulatory Visit (INDEPENDENT_AMBULATORY_CARE_PROVIDER_SITE_OTHER): Payer: Medicare Other | Admitting: Internal Medicine

## 2012-01-16 VITALS — BP 150/84 | HR 76 | Temp 98.0°F | Resp 16 | Ht 60.0 in | Wt 170.0 lb

## 2012-01-16 DIAGNOSIS — I359 Nonrheumatic aortic valve disorder, unspecified: Secondary | ICD-10-CM | POA: Diagnosis not present

## 2012-01-16 DIAGNOSIS — E785 Hyperlipidemia, unspecified: Secondary | ICD-10-CM

## 2012-01-16 DIAGNOSIS — I351 Nonrheumatic aortic (valve) insufficiency: Secondary | ICD-10-CM

## 2012-01-16 MED ORDER — ROSUVASTATIN CALCIUM 20 MG PO TABS: 20.0000 mg | ORAL_TABLET | ORAL | Status: DC

## 2012-01-16 NOTE — Progress Notes (Signed)
  Subjective:    Patient ID: Sara Garza, female    DOB: October 11, 1923, 78 y.o.   MRN: [id number]  HPI The patient has been stress eating AS/AI Had a cath and has bad valve.... Has been a candidate for TAVI program  Moderate risk.    Review of Systems  HENT: Negative.   Respiratory: Negative.   Cardiovascular:       Dyspnea  Gastrointestinal: Negative.        Objective:   Physical Exam  Constitutional: She appears well-developed and well-nourished.  Eyes: Conjunctivae normal are normal. Pupils are equal, round, and reactive to light.  Cardiovascular:  Murmur heard. Pulmonary/Chest: Effort normal and breath sounds normal.  Abdominal: Soft. Bowel sounds are normal.          Assessment & Plan:  Stress management as she decides about the TAVI intervention counseling today reviewed her understanding of the procedure, risks and benifits

## 2012-01-17 DIAGNOSIS — H409 Unspecified glaucoma: Secondary | ICD-10-CM | POA: Diagnosis not present

## 2012-01-17 DIAGNOSIS — H4011X0 Primary open-angle glaucoma, stage unspecified: Secondary | ICD-10-CM | POA: Diagnosis not present

## 2012-01-17 DIAGNOSIS — Z961 Presence of intraocular lens: Secondary | ICD-10-CM | POA: Diagnosis not present

## 2012-01-20 ENCOUNTER — Ambulatory Visit: Payer: Medicare Other | Admitting: Internal Medicine

## 2012-01-29 ENCOUNTER — Ambulatory Visit (INDEPENDENT_AMBULATORY_CARE_PROVIDER_SITE_OTHER): Payer: Medicare Other | Admitting: Nurse Practitioner

## 2012-01-29 DIAGNOSIS — Z1231 Encounter for screening mammogram for malignant neoplasm of breast: Secondary | ICD-10-CM | POA: Diagnosis not present

## 2012-01-29 DIAGNOSIS — M79609 Pain in unspecified limb: Secondary | ICD-10-CM | POA: Diagnosis not present

## 2012-01-29 DIAGNOSIS — M79641 Pain in right hand: Secondary | ICD-10-CM

## 2012-01-29 NOTE — Progress Notes (Signed)
[place] Date of Birth: [birth year] Medical Record [id number]  History of Present Illness: [name] is seen today as a walk in visit. She is seen for Dr. Burt Knack. She has had cardiac cath almost a month ago. This was for critical AS. Looks like she is going down to [place] for possible [name].   She comes in today. She is here alone. She is still having issues with her right wrist. Apparently not able to go thru with the radial approach and was cathed thru the groin. She had extensive bruising of her arm. This has gotten better. She is still having some numbness of her right thumb. It is still sore. It sounds like it is getting better just not resolved. She was concerned. She was having a mammogram in the building and asked to be seen.   Current Outpatient Prescriptions on File Prior to Visit  Medication Sig Dispense Refill  . Amlodipine-Valsartan-HCTZ (EXFORGE HCT) 5-160-12.5 MG TABS Take 1 tablet by mouth daily.       [name] aspirin 81 MG tablet Take 81 mg by mouth daily.        . bimatoprost (LUMIGAN) 0.03 % ophthalmic drops Place 1 drop into both eyes at bedtime.        . Calcium Carb-Cholecalciferol (CALCIUM 500 +D) 500-400 MG-UNIT TABS Take by mouth daily.        [name] CALCIUM PO Take 1 tablet by mouth daily.      . cholecalciferol (VITAMIN D) 1000 UNITS tablet Take 1,000 Units by mouth daily.        [name] KRILL OIL 1000 MG CAPS Take 1 capsule by mouth daily.       . metFORMIN (GLUCOPHAGE-XR) 500 MG 24 hr tablet Take 500 mg by mouth daily with breakfast.      . Multiple Vitamin (MULTIVITAMIN) tablet Take 1 tablet by mouth daily.        . rosuvastatin (CRESTOR) 20 MG tablet Take 1 tablet (20 mg total) by mouth once a week.  90 tablet  3    Allergies  Allergen Reactions  . Statins Other (See Comments)    Muscle aches  . Venlafaxine     REACTION: palpitation    Past Medical History  Diagnosis Date  . Hyperlipidemia   . Hypertension   . Osteoporosis   . PMR (polymyalgia rheumatica)     . Diabetes mellitus   . Arthritis   . Carotid stenosis, left     50-60% stable    Past Surgical History  Procedure Date  . Cesarean section   . Appendectomy   . Foot surgery     [name]    History  Smoking status  . Never Smoker   Smokeless tobacco  . Never Used    History  Alcohol Use  . Yes    very seldom    Family History  Problem Relation Age of Onset  . Lung cancer    . COPD Father   . Cancer Father     lung cancer    Review of Systems: The review of systems is per the HPI.  All other systems were reviewed and are negative.  Physical Exam: There were no vitals taken for this visit. I checked her right arm. She has 2+ pulses in the right wrist. No bruising noted. Very small knot over the the wrist noted. She has normal range of motion, good sensation and color noted to the right hand.   LABORATORY DATA: N/A  Assessment / Plan:  S/P attempt at right radial cath - I think her arm/hand is ok.Her bruising has resolved. She probably has some nerve irritation from the prior bruising/swelling which accounts for the numbness. She has normal pulses and normal movement of this extremity.  I would still recommend heat/cold compresses as Dr. Burt Knack has suggested. It does sound like she is getting better and I think with a little more time, it will resolve. She will follow up with Dr. Burt Knack if needed.   Patient is agreeable to this plan and will call if any problems develop in the interim.

## 2012-02-05 ENCOUNTER — Encounter: Payer: Self-pay | Admitting: Internal Medicine

## 2012-03-16 ENCOUNTER — Ambulatory Visit (INDEPENDENT_AMBULATORY_CARE_PROVIDER_SITE_OTHER): Payer: Medicare Other | Admitting: Internal Medicine

## 2012-03-16 ENCOUNTER — Encounter: Payer: Self-pay | Admitting: Internal Medicine

## 2012-03-16 VITALS — BP 130/70 | HR 80 | Temp 98.2°F | Resp 18 | Ht 60.0 in | Wt 168.0 lb

## 2012-03-16 DIAGNOSIS — I350 Nonrheumatic aortic (valve) stenosis: Secondary | ICD-10-CM

## 2012-03-16 DIAGNOSIS — E785 Hyperlipidemia, unspecified: Secondary | ICD-10-CM

## 2012-03-16 DIAGNOSIS — I359 Nonrheumatic aortic valve disorder, unspecified: Secondary | ICD-10-CM

## 2012-03-16 DIAGNOSIS — I10 Essential (primary) hypertension: Secondary | ICD-10-CM

## 2012-03-16 NOTE — Progress Notes (Signed)
Subjective:    Patient ID: Sara Garza, female    DOB: 27-Aug-1923, 78 y.o.   MRN: [id number]  HPI  The patient has increased SOB due to progressive valve disease She is worried She has noted increased fluid in her legs at night   Review of Systems  Constitutional: Negative for activity change, appetite change and fatigue.  HENT: Negative for ear pain, congestion, neck pain, postnasal drip and sinus pressure.   Eyes: Negative for redness and visual disturbance.  Respiratory: Negative for cough, shortness of breath and wheezing.   Gastrointestinal: Negative for abdominal pain and abdominal distention.  Genitourinary: Negative for dysuria, frequency and menstrual problem.  Musculoskeletal: Negative for myalgias, joint swelling and arthralgias.  Skin: Negative for rash and wound.  Neurological: Negative for dizziness, weakness and headaches.  Hematological: Negative for adenopathy. Does not bruise/bleed easily.  Psychiatric/Behavioral: Negative for sleep disturbance and decreased concentration.   Past Medical History  Diagnosis Date  . Hyperlipidemia   . Hypertension   . Osteoporosis   . PMR (polymyalgia rheumatica)   . Diabetes mellitus   . Arthritis   . Carotid stenosis, left     50-60% stable    History   Social History  . Marital Status: Widowed    Spouse Name: N/A    Number of Children: N/A  . Years of Education: N/A   Occupational History  . Not on file.   Social History Main Topics  . Smoking status: Never Smoker   . Smokeless tobacco: Never Used  . Alcohol Use: Yes     Comment: very seldom  . Drug Use: No  . Sexually Active: Not Currently   Other Topics Concern  . Not on file   Social History Narrative  . No narrative on file    Past Surgical History  Procedure Date  . Cesarean section   . Appendectomy   . Foot surgery     [name]    Family History  Problem Relation Age of Onset  . Lung cancer    . COPD Father   . Cancer Father    lung cancer    Allergies  Allergen Reactions  . Statins Other (See Comments)    Muscle aches  . Venlafaxine     REACTION: palpitation    Current Outpatient Prescriptions on File Prior to Visit  Medication Sig Dispense Refill  . Amlodipine-Valsartan-HCTZ (EXFORGE HCT) 5-160-12.5 MG TABS Take 1 tablet by mouth daily.       [name] aspirin 81 MG tablet Take 81 mg by mouth daily.        . bimatoprost (LUMIGAN) 0.03 % ophthalmic drops Place 1 drop into both eyes at bedtime.        . Calcium Carb-Cholecalciferol (CALCIUM 500 +D) 500-400 MG-UNIT TABS Take by mouth daily.        [name] CALCIUM PO Take 1 tablet by mouth daily.      . cholecalciferol (VITAMIN D) 1000 UNITS tablet Take 1,000 Units by mouth daily.        [name] KRILL OIL 1000 MG CAPS Take 1 capsule by mouth daily.       . metFORMIN (GLUCOPHAGE-XR) 500 MG 24 hr tablet Take 500 mg by mouth daily with breakfast.      . Multiple Vitamin (MULTIVITAMIN) tablet Take 1 tablet by mouth daily.        . rosuvastatin (CRESTOR) 20 MG tablet Take 1 tablet (20 mg total) by mouth once a week.  90 tablet  3  BP 130/70  Pulse 80  Temp 98.2 F (36.8 C)  Resp 18  Ht 5' (1.524 m)  Wt 168 lb (76.204 kg)  BMI 32.81 kg/m2  '    Objective:   Physical Exam  Nursing note and vitals reviewed. Constitutional: She is oriented to person, place, and time. She appears well-developed and well-nourished. No distress.  HENT:  Head: Normocephalic and atraumatic.  Right Ear: External ear normal.  Left Ear: External ear normal.  Nose: Nose normal.  Mouth/Throat: Oropharynx is clear and moist.  Eyes: Conjunctivae normal and EOM are normal. Pupils are equal, round, and reactive to light.  Neck: Normal range of motion. Neck supple. No JVD present. No tracheal deviation present. No thyromegaly present.  Cardiovascular: Normal rate and regular rhythm.   Murmur heard.      3/6  Pulmonary/Chest: Effort normal and breath sounds normal. She has no wheezes. She exhibits no  tenderness.       No rales of crackles  Abdominal: Soft. Bowel sounds are normal.  Musculoskeletal: Normal range of motion. She exhibits edema. She exhibits no tenderness.       2 plus  Lymphadenopathy:    She has no cervical adenopathy.  Neurological: She is alert and oriented to person, place, and time. She has normal reflexes. No cranial nerve deficit.  Skin: Skin is warm and dry. She is not diaphoretic.  Psychiatric: She has a normal mood and affect. Her behavior is normal.          Assessment & Plan:  [place] Dr Albertine Patricia dose the procedure

## 2012-03-18 ENCOUNTER — Telehealth: Payer: Self-pay | Admitting: Cardiovascular Disease

## 2012-03-18 NOTE — Telephone Encounter (Signed)
Spoke with pt, she saw dr gerhardt some time ago regarding her aortic valve. Recently she was seen in [place]. She would like to talk to dr cooper regarding the procedure and what would be in the best interest for her to do. She does not really need an appt she would like to talk to dr cooper if he could call her. Pt made aware dr cooper is not in the office today, will send him the message and she will get a return call when available. Pt agreed with this plan.

## 2012-03-18 NOTE — Telephone Encounter (Signed)
New problem:    Patient calling stating  what is the first available appt for Dr. Burt Knack appt that was offer was  2/14. Then suggest the PA. Patient stated can she be work in on his schedule. Aware that Ander Purpura is not in the office today will return on tomorrow.  Patient stated she has a consent for upcoming surgery only for an appt for advise.

## 2012-03-19 NOTE — Telephone Encounter (Signed)
I called the patient back this morning. She has changed her mind about pursuing the [place] trial in [place]. She prefers to remain in [place] where she has a better support system. I recommended that she go back to see Dr. Servando Snare in followup and she would like to do that. She has clearly become more symptomatic in the last few months and I think will require treatment of her aortic stenosis in the near future.  Sara Garza 03/19/2012 11:44 AM

## 2012-03-19 NOTE — Telephone Encounter (Signed)
New problem:   Will explain more details when Lauren calls.

## 2012-04-04 ENCOUNTER — Other Ambulatory Visit: Payer: Self-pay | Admitting: Internal Medicine

## 2012-04-06 DIAGNOSIS — H903 Sensorineural hearing loss, bilateral: Secondary | ICD-10-CM | POA: Diagnosis not present

## 2012-04-08 ENCOUNTER — Telehealth: Payer: Self-pay | Admitting: Cardiovascular Disease

## 2012-04-08 NOTE — Telephone Encounter (Signed)
Pt does not know what kind of testing.  All she knows is appt and testing with Dr Bridgette Habermann tomorrow.  FYI only.

## 2012-04-08 NOTE — Telephone Encounter (Signed)
Pt calling FYI: re pt has appt with [facility] tomorrow, dr Ginger Carne in [place] for more testing

## 2012-04-09 DIAGNOSIS — I359 Nonrheumatic aortic valve disorder, unspecified: Secondary | ICD-10-CM | POA: Diagnosis not present

## 2012-04-09 DIAGNOSIS — I517 Cardiomegaly: Secondary | ICD-10-CM | POA: Diagnosis not present

## 2012-04-09 DIAGNOSIS — I2789 Other specified pulmonary heart diseases: Secondary | ICD-10-CM | POA: Diagnosis not present

## 2012-04-09 DIAGNOSIS — I059 Rheumatic mitral valve disease, unspecified: Secondary | ICD-10-CM | POA: Diagnosis not present

## 2012-04-10 ENCOUNTER — Telehealth: Payer: Self-pay | Admitting: Internal Medicine

## 2012-04-10 NOTE — Telephone Encounter (Signed)
Pt informed to continue with crestor per dr Arnoldo Morale

## 2012-04-10 NOTE — Telephone Encounter (Signed)
New problem:   Surgery is schedule on 1/22 in [place].

## 2012-04-10 NOTE — Telephone Encounter (Signed)
NA. No voicemail.  

## 2012-04-10 NOTE — Telephone Encounter (Signed)
Mrs Sara Garza wants Dr Burt Knack to know that she has been scheduled for valve surgery on 04/22/12.  FYI.

## 2012-04-10 NOTE — Telephone Encounter (Signed)
Patient calling, she is to have aortic valve replacement surgery done on 1/23.  Needs to know if she should continue with the Crestor 1 po every Friday?

## 2012-04-13 NOTE — Telephone Encounter (Signed)
thx

## 2012-04-14 ENCOUNTER — Encounter: Payer: Self-pay | Admitting: Internal Medicine

## 2012-04-14 NOTE — Patient Instructions (Signed)
The patient is instructed to continue all medications as prescribed. Schedule followup with check out clerk upon leaving the clinic  

## 2012-04-28 DIAGNOSIS — I6529 Occlusion and stenosis of unspecified carotid artery: Secondary | ICD-10-CM | POA: Diagnosis present

## 2012-04-28 DIAGNOSIS — D62 Acute posthemorrhagic anemia: Secondary | ICD-10-CM | POA: Diagnosis not present

## 2012-04-28 DIAGNOSIS — I509 Heart failure, unspecified: Secondary | ICD-10-CM | POA: Diagnosis not present

## 2012-04-28 DIAGNOSIS — I440 Atrioventricular block, first degree: Secondary | ICD-10-CM | POA: Diagnosis not present

## 2012-04-28 DIAGNOSIS — J90 Pleural effusion, not elsewhere classified: Secondary | ICD-10-CM | POA: Diagnosis not present

## 2012-04-28 DIAGNOSIS — M353 Polymyalgia rheumatica: Secondary | ICD-10-CM | POA: Diagnosis present

## 2012-04-28 DIAGNOSIS — M810 Age-related osteoporosis without current pathological fracture: Secondary | ICD-10-CM | POA: Diagnosis present

## 2012-04-28 DIAGNOSIS — IMO0002 Reserved for concepts with insufficient information to code with codable children: Secondary | ICD-10-CM | POA: Diagnosis not present

## 2012-04-28 DIAGNOSIS — Z8739 Personal history of other diseases of the musculoskeletal system and connective tissue: Secondary | ICD-10-CM | POA: Diagnosis not present

## 2012-04-28 DIAGNOSIS — I059 Rheumatic mitral valve disease, unspecified: Secondary | ICD-10-CM | POA: Diagnosis not present

## 2012-04-28 DIAGNOSIS — I10 Essential (primary) hypertension: Secondary | ICD-10-CM | POA: Diagnosis not present

## 2012-04-28 DIAGNOSIS — J9589 Other postprocedural complications and disorders of respiratory system, not elsewhere classified: Secondary | ICD-10-CM | POA: Diagnosis not present

## 2012-04-28 DIAGNOSIS — Z006 Encounter for examination for normal comparison and control in clinical research program: Secondary | ICD-10-CM | POA: Diagnosis not present

## 2012-04-28 DIAGNOSIS — Z833 Family history of diabetes mellitus: Secondary | ICD-10-CM | POA: Diagnosis not present

## 2012-04-28 DIAGNOSIS — E119 Type 2 diabetes mellitus without complications: Secondary | ICD-10-CM | POA: Diagnosis not present

## 2012-04-28 DIAGNOSIS — N183 Chronic kidney disease, stage 3 unspecified: Secondary | ICD-10-CM | POA: Diagnosis not present

## 2012-04-28 DIAGNOSIS — I379 Nonrheumatic pulmonary valve disorder, unspecified: Secondary | ICD-10-CM | POA: Diagnosis not present

## 2012-04-28 DIAGNOSIS — I5032 Chronic diastolic (congestive) heart failure: Secondary | ICD-10-CM | POA: Diagnosis not present

## 2012-04-28 DIAGNOSIS — J811 Chronic pulmonary edema: Secondary | ICD-10-CM | POA: Diagnosis not present

## 2012-04-28 DIAGNOSIS — I447 Left bundle-branch block, unspecified: Secondary | ICD-10-CM | POA: Diagnosis present

## 2012-04-28 DIAGNOSIS — I2510 Atherosclerotic heart disease of native coronary artery without angina pectoris: Secondary | ICD-10-CM | POA: Diagnosis not present

## 2012-04-28 DIAGNOSIS — I454 Nonspecific intraventricular block: Secondary | ICD-10-CM | POA: Diagnosis not present

## 2012-04-28 DIAGNOSIS — J9819 Other pulmonary collapse: Secondary | ICD-10-CM | POA: Diagnosis not present

## 2012-04-28 DIAGNOSIS — E7800 Pure hypercholesterolemia, unspecified: Secondary | ICD-10-CM | POA: Diagnosis present

## 2012-04-28 DIAGNOSIS — R918 Other nonspecific abnormal finding of lung field: Secondary | ICD-10-CM | POA: Diagnosis not present

## 2012-04-28 DIAGNOSIS — I517 Cardiomegaly: Secondary | ICD-10-CM | POA: Diagnosis not present

## 2012-04-28 DIAGNOSIS — I129 Hypertensive chronic kidney disease with stage 1 through stage 4 chronic kidney disease, or unspecified chronic kidney disease: Secondary | ICD-10-CM | POA: Diagnosis not present

## 2012-04-28 DIAGNOSIS — Z09 Encounter for follow-up examination after completed treatment for conditions other than malignant neoplasm: Secondary | ICD-10-CM | POA: Diagnosis not present

## 2012-04-28 DIAGNOSIS — I359 Nonrheumatic aortic valve disorder, unspecified: Secondary | ICD-10-CM | POA: Diagnosis not present

## 2012-04-29 HISTORY — PX: CARDIAC VALVE REPLACEMENT: SHX585

## 2012-05-04 DIAGNOSIS — I359 Nonrheumatic aortic valve disorder, unspecified: Secondary | ICD-10-CM | POA: Diagnosis not present

## 2012-05-04 DIAGNOSIS — Z954 Presence of other heart-valve replacement: Secondary | ICD-10-CM | POA: Diagnosis not present

## 2012-05-04 DIAGNOSIS — M353 Polymyalgia rheumatica: Secondary | ICD-10-CM | POA: Diagnosis not present

## 2012-05-04 DIAGNOSIS — Z5189 Encounter for other specified aftercare: Secondary | ICD-10-CM | POA: Diagnosis not present

## 2012-05-04 DIAGNOSIS — Z48812 Encounter for surgical aftercare following surgery on the circulatory system: Secondary | ICD-10-CM | POA: Diagnosis not present

## 2012-05-04 DIAGNOSIS — I059 Rheumatic mitral valve disease, unspecified: Secondary | ICD-10-CM | POA: Diagnosis not present

## 2012-05-04 DIAGNOSIS — N183 Chronic kidney disease, stage 3 unspecified: Secondary | ICD-10-CM | POA: Diagnosis not present

## 2012-05-04 DIAGNOSIS — J9819 Other pulmonary collapse: Secondary | ICD-10-CM | POA: Diagnosis not present

## 2012-05-04 DIAGNOSIS — E119 Type 2 diabetes mellitus without complications: Secondary | ICD-10-CM | POA: Diagnosis not present

## 2012-05-04 DIAGNOSIS — Z09 Encounter for follow-up examination after completed treatment for conditions other than malignant neoplasm: Secondary | ICD-10-CM | POA: Diagnosis not present

## 2012-05-04 DIAGNOSIS — E785 Hyperlipidemia, unspecified: Secondary | ICD-10-CM | POA: Diagnosis not present

## 2012-05-04 DIAGNOSIS — I2510 Atherosclerotic heart disease of native coronary artery without angina pectoris: Secondary | ICD-10-CM | POA: Diagnosis not present

## 2012-05-04 DIAGNOSIS — I6529 Occlusion and stenosis of unspecified carotid artery: Secondary | ICD-10-CM | POA: Diagnosis not present

## 2012-05-04 DIAGNOSIS — IMO0002 Reserved for concepts with insufficient information to code with codable children: Secondary | ICD-10-CM | POA: Diagnosis not present

## 2012-05-04 DIAGNOSIS — I253 Aneurysm of heart: Secondary | ICD-10-CM | POA: Diagnosis not present

## 2012-05-04 DIAGNOSIS — Z79899 Other long term (current) drug therapy: Secondary | ICD-10-CM | POA: Diagnosis not present

## 2012-05-04 DIAGNOSIS — I447 Left bundle-branch block, unspecified: Secondary | ICD-10-CM | POA: Diagnosis not present

## 2012-05-04 DIAGNOSIS — I129 Hypertensive chronic kidney disease with stage 1 through stage 4 chronic kidney disease, or unspecified chronic kidney disease: Secondary | ICD-10-CM | POA: Diagnosis not present

## 2012-05-04 DIAGNOSIS — I10 Essential (primary) hypertension: Secondary | ICD-10-CM | POA: Diagnosis not present

## 2012-05-04 DIAGNOSIS — IMO0001 Reserved for inherently not codable concepts without codable children: Secondary | ICD-10-CM | POA: Diagnosis not present

## 2012-05-04 DIAGNOSIS — R5381 Other malaise: Secondary | ICD-10-CM | POA: Diagnosis not present

## 2012-05-04 DIAGNOSIS — I517 Cardiomegaly: Secondary | ICD-10-CM | POA: Diagnosis not present

## 2012-05-04 DIAGNOSIS — Z7982 Long term (current) use of aspirin: Secondary | ICD-10-CM | POA: Diagnosis not present

## 2012-05-11 ENCOUNTER — Ambulatory Visit: Payer: Medicare Other | Admitting: Family

## 2012-05-13 DIAGNOSIS — I253 Aneurysm of heart: Secondary | ICD-10-CM | POA: Diagnosis not present

## 2012-05-13 DIAGNOSIS — I059 Rheumatic mitral valve disease, unspecified: Secondary | ICD-10-CM | POA: Diagnosis not present

## 2012-05-13 DIAGNOSIS — I359 Nonrheumatic aortic valve disorder, unspecified: Secondary | ICD-10-CM | POA: Diagnosis not present

## 2012-05-16 DIAGNOSIS — R269 Unspecified abnormalities of gait and mobility: Secondary | ICD-10-CM | POA: Diagnosis not present

## 2012-05-16 DIAGNOSIS — IMO0002 Reserved for concepts with insufficient information to code with codable children: Secondary | ICD-10-CM | POA: Diagnosis not present

## 2012-05-16 DIAGNOSIS — M353 Polymyalgia rheumatica: Secondary | ICD-10-CM | POA: Diagnosis not present

## 2012-05-16 DIAGNOSIS — E119 Type 2 diabetes mellitus without complications: Secondary | ICD-10-CM | POA: Diagnosis not present

## 2012-05-16 DIAGNOSIS — D649 Anemia, unspecified: Secondary | ICD-10-CM | POA: Diagnosis not present

## 2012-05-17 DIAGNOSIS — E119 Type 2 diabetes mellitus without complications: Secondary | ICD-10-CM | POA: Diagnosis not present

## 2012-05-17 DIAGNOSIS — IMO0002 Reserved for concepts with insufficient information to code with codable children: Secondary | ICD-10-CM | POA: Diagnosis not present

## 2012-05-17 DIAGNOSIS — D649 Anemia, unspecified: Secondary | ICD-10-CM | POA: Diagnosis not present

## 2012-05-17 DIAGNOSIS — R269 Unspecified abnormalities of gait and mobility: Secondary | ICD-10-CM | POA: Diagnosis not present

## 2012-05-17 DIAGNOSIS — M353 Polymyalgia rheumatica: Secondary | ICD-10-CM | POA: Diagnosis not present

## 2012-05-18 ENCOUNTER — Other Ambulatory Visit: Payer: Self-pay | Admitting: Internal Medicine

## 2012-05-18 ENCOUNTER — Other Ambulatory Visit: Payer: Medicare Other

## 2012-05-18 ENCOUNTER — Telehealth: Payer: Self-pay | Admitting: Cardiovascular Disease

## 2012-05-18 DIAGNOSIS — D649 Anemia, unspecified: Secondary | ICD-10-CM | POA: Diagnosis not present

## 2012-05-18 DIAGNOSIS — IMO0002 Reserved for concepts with insufficient information to code with codable children: Secondary | ICD-10-CM | POA: Diagnosis not present

## 2012-05-18 DIAGNOSIS — M353 Polymyalgia rheumatica: Secondary | ICD-10-CM | POA: Diagnosis not present

## 2012-05-18 DIAGNOSIS — R269 Unspecified abnormalities of gait and mobility: Secondary | ICD-10-CM | POA: Diagnosis not present

## 2012-05-18 DIAGNOSIS — E119 Type 2 diabetes mellitus without complications: Secondary | ICD-10-CM | POA: Diagnosis not present

## 2012-05-18 NOTE — Telephone Encounter (Signed)
I spoke with the pt and she was discharged from the hospital on 05/13/12. The pt does have a home health nurse from [place] doing home visits. The [facility] saw the pt on Saturday for her first visit and the pt was doing well.  On Sunday morning the pt went to the bathroom and noticed blood dripping down the right leg from her groin site. The pt called her neighbor and held pressure at site.  The bleeding stopped and the [facility] came and evaluated the pt and a pressure dressing was placed. The pt denies any further bleeding since yesterday morning. The pt states she has a hematoma at site and this was evaluated by ultrasound prior to her discharge. The hematoma has not increased in size since discharge from the hospital.  I instructed the pt to continue to monitor the site for bleeding, redness, warmth and other signs of infection.  I reminded the pt to not take a tub bath and call our office with any other questions or concerns.  The pt is scheduled to follow-up with Dr Albertine Patricia later this month for post hospital follow-up.  I also made the pt aware that she can remove dressing since she is out 24 hours from episode of bleeding.  The pt does have guaze if needed. I will forward this message to Dr Burt Knack to review and make further recommendations if needed.

## 2012-05-18 NOTE — Telephone Encounter (Signed)
New Problem   Pt had a new valve replacement. Pt woke up and noticed blood dripping down leg, she called [facility] and nurse came and checked it out. Was told to follow up with Dr. Burt Knack to see if appt was necessary. Would like to speak nurse.

## 2012-05-19 DIAGNOSIS — R269 Unspecified abnormalities of gait and mobility: Secondary | ICD-10-CM | POA: Diagnosis not present

## 2012-05-19 DIAGNOSIS — E119 Type 2 diabetes mellitus without complications: Secondary | ICD-10-CM | POA: Diagnosis not present

## 2012-05-19 DIAGNOSIS — IMO0002 Reserved for concepts with insufficient information to code with codable children: Secondary | ICD-10-CM | POA: Diagnosis not present

## 2012-05-19 DIAGNOSIS — M353 Polymyalgia rheumatica: Secondary | ICD-10-CM | POA: Diagnosis not present

## 2012-05-19 DIAGNOSIS — D649 Anemia, unspecified: Secondary | ICD-10-CM | POA: Diagnosis not present

## 2012-05-20 ENCOUNTER — Ambulatory Visit: Payer: Medicare Other | Admitting: Internal Medicine

## 2012-05-21 DIAGNOSIS — D649 Anemia, unspecified: Secondary | ICD-10-CM | POA: Diagnosis not present

## 2012-05-21 DIAGNOSIS — M353 Polymyalgia rheumatica: Secondary | ICD-10-CM | POA: Diagnosis not present

## 2012-05-21 DIAGNOSIS — E119 Type 2 diabetes mellitus without complications: Secondary | ICD-10-CM | POA: Diagnosis not present

## 2012-05-21 DIAGNOSIS — IMO0002 Reserved for concepts with insufficient information to code with codable children: Secondary | ICD-10-CM | POA: Diagnosis not present

## 2012-05-21 DIAGNOSIS — R269 Unspecified abnormalities of gait and mobility: Secondary | ICD-10-CM | POA: Diagnosis not present

## 2012-05-22 ENCOUNTER — Other Ambulatory Visit: Payer: Medicare Other

## 2012-05-22 ENCOUNTER — Ambulatory Visit: Payer: Medicare Other | Admitting: Neurosurgery

## 2012-05-22 DIAGNOSIS — M353 Polymyalgia rheumatica: Secondary | ICD-10-CM | POA: Diagnosis not present

## 2012-05-22 DIAGNOSIS — IMO0002 Reserved for concepts with insufficient information to code with codable children: Secondary | ICD-10-CM | POA: Diagnosis not present

## 2012-05-22 DIAGNOSIS — E119 Type 2 diabetes mellitus without complications: Secondary | ICD-10-CM | POA: Diagnosis not present

## 2012-05-22 DIAGNOSIS — R269 Unspecified abnormalities of gait and mobility: Secondary | ICD-10-CM | POA: Diagnosis not present

## 2012-05-22 DIAGNOSIS — D649 Anemia, unspecified: Secondary | ICD-10-CM | POA: Diagnosis not present

## 2012-05-25 DIAGNOSIS — R269 Unspecified abnormalities of gait and mobility: Secondary | ICD-10-CM | POA: Diagnosis not present

## 2012-05-25 DIAGNOSIS — D649 Anemia, unspecified: Secondary | ICD-10-CM | POA: Diagnosis not present

## 2012-05-25 DIAGNOSIS — E119 Type 2 diabetes mellitus without complications: Secondary | ICD-10-CM | POA: Diagnosis not present

## 2012-05-25 DIAGNOSIS — M353 Polymyalgia rheumatica: Secondary | ICD-10-CM | POA: Diagnosis not present

## 2012-05-25 DIAGNOSIS — IMO0002 Reserved for concepts with insufficient information to code with codable children: Secondary | ICD-10-CM | POA: Diagnosis not present

## 2012-05-26 DIAGNOSIS — IMO0002 Reserved for concepts with insufficient information to code with codable children: Secondary | ICD-10-CM | POA: Diagnosis not present

## 2012-05-26 DIAGNOSIS — M353 Polymyalgia rheumatica: Secondary | ICD-10-CM | POA: Diagnosis not present

## 2012-05-26 DIAGNOSIS — E119 Type 2 diabetes mellitus without complications: Secondary | ICD-10-CM | POA: Diagnosis not present

## 2012-05-26 DIAGNOSIS — R269 Unspecified abnormalities of gait and mobility: Secondary | ICD-10-CM | POA: Diagnosis not present

## 2012-05-26 DIAGNOSIS — D649 Anemia, unspecified: Secondary | ICD-10-CM | POA: Diagnosis not present

## 2012-05-27 DIAGNOSIS — D649 Anemia, unspecified: Secondary | ICD-10-CM | POA: Diagnosis not present

## 2012-05-27 DIAGNOSIS — IMO0002 Reserved for concepts with insufficient information to code with codable children: Secondary | ICD-10-CM | POA: Diagnosis not present

## 2012-05-27 DIAGNOSIS — E119 Type 2 diabetes mellitus without complications: Secondary | ICD-10-CM | POA: Diagnosis not present

## 2012-05-27 DIAGNOSIS — M353 Polymyalgia rheumatica: Secondary | ICD-10-CM | POA: Diagnosis not present

## 2012-05-27 DIAGNOSIS — R269 Unspecified abnormalities of gait and mobility: Secondary | ICD-10-CM | POA: Diagnosis not present

## 2012-05-28 DIAGNOSIS — I499 Cardiac arrhythmia, unspecified: Secondary | ICD-10-CM | POA: Diagnosis not present

## 2012-05-28 DIAGNOSIS — J9589 Other postprocedural complications and disorders of respiratory system, not elsewhere classified: Secondary | ICD-10-CM | POA: Diagnosis not present

## 2012-05-28 DIAGNOSIS — IMO0002 Reserved for concepts with insufficient information to code with codable children: Secondary | ICD-10-CM | POA: Diagnosis not present

## 2012-05-28 DIAGNOSIS — I359 Nonrheumatic aortic valve disorder, unspecified: Secondary | ICD-10-CM | POA: Diagnosis not present

## 2012-05-28 DIAGNOSIS — Z006 Encounter for examination for normal comparison and control in clinical research program: Secondary | ICD-10-CM | POA: Diagnosis not present

## 2012-05-29 ENCOUNTER — Other Ambulatory Visit: Payer: Medicare Other

## 2012-05-29 ENCOUNTER — Ambulatory Visit: Payer: Medicare Other | Admitting: Neurosurgery

## 2012-05-29 DIAGNOSIS — D649 Anemia, unspecified: Secondary | ICD-10-CM | POA: Diagnosis not present

## 2012-05-29 DIAGNOSIS — E119 Type 2 diabetes mellitus without complications: Secondary | ICD-10-CM | POA: Diagnosis not present

## 2012-05-29 DIAGNOSIS — M353 Polymyalgia rheumatica: Secondary | ICD-10-CM | POA: Diagnosis not present

## 2012-05-29 DIAGNOSIS — IMO0002 Reserved for concepts with insufficient information to code with codable children: Secondary | ICD-10-CM | POA: Diagnosis not present

## 2012-05-29 DIAGNOSIS — R269 Unspecified abnormalities of gait and mobility: Secondary | ICD-10-CM | POA: Diagnosis not present

## 2012-06-03 DIAGNOSIS — D649 Anemia, unspecified: Secondary | ICD-10-CM | POA: Diagnosis not present

## 2012-06-03 DIAGNOSIS — M353 Polymyalgia rheumatica: Secondary | ICD-10-CM | POA: Diagnosis not present

## 2012-06-03 DIAGNOSIS — IMO0002 Reserved for concepts with insufficient information to code with codable children: Secondary | ICD-10-CM | POA: Diagnosis not present

## 2012-06-03 DIAGNOSIS — E119 Type 2 diabetes mellitus without complications: Secondary | ICD-10-CM | POA: Diagnosis not present

## 2012-06-03 DIAGNOSIS — R269 Unspecified abnormalities of gait and mobility: Secondary | ICD-10-CM | POA: Diagnosis not present

## 2012-06-08 ENCOUNTER — Telehealth: Payer: Self-pay | Admitting: Internal Medicine

## 2012-06-08 NOTE — Telephone Encounter (Signed)
Patient has appt w/Dr. Arnoldo Morale for 4/25. States she is just out of the hospital and was told to see him in 1 month. Anyway we can move up? She is on cancellation list.

## 2012-06-08 NOTE — Telephone Encounter (Signed)
Look for a cancellation

## 2012-06-08 NOTE — Telephone Encounter (Signed)
Might be able to squeeze her in on 4/2. Otherwise, he's out til June.

## 2012-06-08 NOTE — Telephone Encounter (Signed)
Called & LMOM to move up appt. Encounter closed.

## 2012-06-09 DIAGNOSIS — H409 Unspecified glaucoma: Secondary | ICD-10-CM | POA: Diagnosis not present

## 2012-06-09 DIAGNOSIS — H4011X0 Primary open-angle glaucoma, stage unspecified: Secondary | ICD-10-CM | POA: Diagnosis not present

## 2012-06-10 DIAGNOSIS — E119 Type 2 diabetes mellitus without complications: Secondary | ICD-10-CM | POA: Diagnosis not present

## 2012-06-10 DIAGNOSIS — D649 Anemia, unspecified: Secondary | ICD-10-CM | POA: Diagnosis not present

## 2012-06-10 DIAGNOSIS — M353 Polymyalgia rheumatica: Secondary | ICD-10-CM | POA: Diagnosis not present

## 2012-06-10 DIAGNOSIS — R269 Unspecified abnormalities of gait and mobility: Secondary | ICD-10-CM | POA: Diagnosis not present

## 2012-06-10 DIAGNOSIS — IMO0002 Reserved for concepts with insufficient information to code with codable children: Secondary | ICD-10-CM | POA: Diagnosis not present

## 2012-06-11 ENCOUNTER — Other Ambulatory Visit: Payer: Self-pay | Admitting: *Deleted

## 2012-06-11 DIAGNOSIS — R269 Unspecified abnormalities of gait and mobility: Secondary | ICD-10-CM | POA: Diagnosis not present

## 2012-06-11 DIAGNOSIS — D649 Anemia, unspecified: Secondary | ICD-10-CM | POA: Diagnosis not present

## 2012-06-11 DIAGNOSIS — E119 Type 2 diabetes mellitus without complications: Secondary | ICD-10-CM | POA: Diagnosis not present

## 2012-06-11 DIAGNOSIS — M353 Polymyalgia rheumatica: Secondary | ICD-10-CM | POA: Diagnosis not present

## 2012-06-11 DIAGNOSIS — IMO0002 Reserved for concepts with insufficient information to code with codable children: Secondary | ICD-10-CM | POA: Diagnosis not present

## 2012-06-15 ENCOUNTER — Other Ambulatory Visit: Payer: Self-pay | Admitting: *Deleted

## 2012-06-15 DIAGNOSIS — M353 Polymyalgia rheumatica: Secondary | ICD-10-CM | POA: Diagnosis not present

## 2012-06-15 DIAGNOSIS — IMO0002 Reserved for concepts with insufficient information to code with codable children: Secondary | ICD-10-CM | POA: Diagnosis not present

## 2012-06-15 DIAGNOSIS — E119 Type 2 diabetes mellitus without complications: Secondary | ICD-10-CM | POA: Diagnosis not present

## 2012-06-15 DIAGNOSIS — D649 Anemia, unspecified: Secondary | ICD-10-CM | POA: Diagnosis not present

## 2012-06-15 DIAGNOSIS — R269 Unspecified abnormalities of gait and mobility: Secondary | ICD-10-CM | POA: Diagnosis not present

## 2012-06-15 MED ORDER — GLUCOSE BLOOD VI STRP: 1.0000 | ORAL_STRIP | Freq: Every day | Status: DC

## 2012-06-16 DIAGNOSIS — IMO0002 Reserved for concepts with insufficient information to code with codable children: Secondary | ICD-10-CM | POA: Diagnosis not present

## 2012-06-16 DIAGNOSIS — S75009A Unspecified injury of femoral artery, unspecified leg, initial encounter: Secondary | ICD-10-CM | POA: Diagnosis not present

## 2012-06-17 DIAGNOSIS — IMO0002 Reserved for concepts with insufficient information to code with codable children: Secondary | ICD-10-CM | POA: Diagnosis not present

## 2012-06-17 DIAGNOSIS — M353 Polymyalgia rheumatica: Secondary | ICD-10-CM | POA: Diagnosis not present

## 2012-06-17 DIAGNOSIS — D649 Anemia, unspecified: Secondary | ICD-10-CM | POA: Diagnosis not present

## 2012-06-17 DIAGNOSIS — E119 Type 2 diabetes mellitus without complications: Secondary | ICD-10-CM | POA: Diagnosis not present

## 2012-06-17 DIAGNOSIS — R269 Unspecified abnormalities of gait and mobility: Secondary | ICD-10-CM | POA: Diagnosis not present

## 2012-06-19 DIAGNOSIS — E119 Type 2 diabetes mellitus without complications: Secondary | ICD-10-CM | POA: Diagnosis not present

## 2012-06-19 DIAGNOSIS — R269 Unspecified abnormalities of gait and mobility: Secondary | ICD-10-CM | POA: Diagnosis not present

## 2012-06-19 DIAGNOSIS — IMO0002 Reserved for concepts with insufficient information to code with codable children: Secondary | ICD-10-CM | POA: Diagnosis not present

## 2012-06-19 DIAGNOSIS — M353 Polymyalgia rheumatica: Secondary | ICD-10-CM | POA: Diagnosis not present

## 2012-06-19 DIAGNOSIS — D649 Anemia, unspecified: Secondary | ICD-10-CM | POA: Diagnosis not present

## 2012-06-22 DIAGNOSIS — H409 Unspecified glaucoma: Secondary | ICD-10-CM | POA: Diagnosis not present

## 2012-06-22 DIAGNOSIS — H2640 Unspecified secondary cataract: Secondary | ICD-10-CM | POA: Diagnosis not present

## 2012-06-22 DIAGNOSIS — E119 Type 2 diabetes mellitus without complications: Secondary | ICD-10-CM | POA: Diagnosis not present

## 2012-06-22 DIAGNOSIS — H4011X0 Primary open-angle glaucoma, stage unspecified: Secondary | ICD-10-CM | POA: Diagnosis not present

## 2012-06-23 ENCOUNTER — Encounter: Payer: Self-pay | Admitting: Cardiovascular Disease

## 2012-06-23 ENCOUNTER — Ambulatory Visit (INDEPENDENT_AMBULATORY_CARE_PROVIDER_SITE_OTHER): Payer: Medicare Other | Admitting: Cardiovascular Disease

## 2012-06-23 VITALS — BP 126/62 | HR 89 | Ht 60.0 in | Wt 161.0 lb

## 2012-06-23 DIAGNOSIS — I10 Essential (primary) hypertension: Secondary | ICD-10-CM | POA: Diagnosis not present

## 2012-06-23 DIAGNOSIS — I359 Nonrheumatic aortic valve disorder, unspecified: Secondary | ICD-10-CM | POA: Diagnosis not present

## 2012-06-23 NOTE — Patient Instructions (Addendum)
Your physician recommends that you return for lab work in 3 weeks for a [place].  Your physician recommends that you schedule a follow-up appointment in: 4 weeks with Dr. Burt Knack.

## 2012-06-23 NOTE — Progress Notes (Signed)
HPI:  79 year old woman presenting for followup evaluation. She has a history of coronary artery disease and severe symptomatic aortic stenosis. She underwent transcatheter aortic valve replacement with a Medtronic core valve 04/29/2012 via transfemoral access. She has mild perivalvular AI and preserved LV function on followup echo. Her surgery and followup studies have been done at [facility] in [place]. She returns today for followup.  She's had a seroma and this evolved into an infection a few weeks back. She was treated with oral antibiotics. She notes that the foul smelling dark incisional discharge has resolved, as has the tenderness in that area. She denies fevers or chills. Overall she feels much better. Exertional dyspnea is greatly improved. She's had no further chest pain. She denies lightheadedness or syncope.  Outpatient Encounter Prescriptions as of 06/23/2012  Medication Sig Dispense Refill  . Amlodipine-Valsartan-HCTZ (EXFORGE HCT) 5-160-12.5 MG TABS Take 1 tablet by mouth daily.       [name] aspirin 81 MG tablet Take 81 mg by mouth daily.        . bimatoprost (LUMIGAN) 0.03 % ophthalmic drops Place 1 drop into both eyes at bedtime.        . Calcium Carb-Cholecalciferol (CALCIUM 500 +D) 500-400 MG-UNIT TABS Take by mouth daily.        [name] CALCIUM PO Take 1 tablet by mouth daily.      . clopidogrel (PLAVIX) 75 MG tablet Take one tablet daily      . EXFORGE HCT 5-160-12.5 MG TABS TAKE 1 TABLET EVERY DAY  30 tablet  5  . famotidine (PEPCID) 20 MG tablet Take one tablet by mouth twice a day      . ferrous sulfate 325 (65 FE) MG tablet Take 325 mg by mouth daily with breakfast.      . folic acid (FOLVITE) 1 MG tablet Take 1 mg by mouth daily.      [name] glucose blood (FREESTYLE LITE) test strip 1 each by Other route daily. Use as instructed  100 each  12  . Krill Oil 500 MG CAPS Take 500 capsules by mouth 3 (three) times daily.      . Lancets (FREESTYLE) lancets USE AS INSTRUCTED   100 each  6  . metFORMIN (GLUCOPHAGE-XR) 500 MG 24 hr tablet Take 500 mg by mouth daily with breakfast.      . Multiple Vitamin (MULTIVITAMIN) tablet Take 1 tablet by mouth daily.        . rosuvastatin (CRESTOR) 20 MG tablet Take 1 tablet (20 mg total) by mouth once a week.  90 tablet  3  . [DISCONTINUED] cholecalciferol (VITAMIN D) 1000 UNITS tablet Take 1,000 Units by mouth daily.        . [DISCONTINUED] KRILL OIL 1000 MG CAPS Take 1 capsule by mouth daily.        No facility-administered encounter medications on file as of 06/23/2012.    Allergies  Allergen Reactions  . Statins Other (See Comments)    Muscle aches  . Venlafaxine     REACTION: palpitation    Past Medical History  Diagnosis Date  . Hyperlipidemia   . Hypertension   . Osteoporosis   . PMR (polymyalgia rheumatica)   . Diabetes mellitus   . Arthritis   . Carotid stenosis, left     50-60% stable   ROS: Negative except as per HPI  BP 126/62  Pulse 89  Ht 5' (1.524 m)  Wt 73.029 kg (161 lb)  BMI 31.44 kg/m2  SpO2 99%  PHYSICAL EXAM: Pt is alert and oriented, pleasant elderly woman in NAD HEENT: normal Neck: JVP - normal Lungs: CTA bilaterally CV: RRR with grade 2/6 systolic ejection murmur at the) sternal border Abd: soft, NT, Positive BS, no hepatomegaly Ext: no C/C/E, distal pulses intact and equal, right groin incision site with mild clear drainage, no purulence, no tenderness  EKG:  Normal sinus rhythm with left bundle branch block, 89 beats per minute.  ASSESSMENT AND PLAN: 1. Severe aortic stenosis status post transcatheter aortic valve replacement. The patient was treated as a "role in" in the SURTAVI moderate-risk trial. A 23 mm CoreValve was used. I have reviewed her records from [place]. 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 This is thought to be related to a contained wire perforation. Her valve function is normal. She will  continue her current medical program. Her right groin infection involving her arterial cutdown incision appears to be healing appropriately on antibiotics.  2. Hypertension. Blood pressure is well controlled on her current medical program. Will continue the same  3. Anemia. Hemoglobin was 10 mg/dL last month. She has been taking iron orally. Will repeat a CBC next month when she returns for followup.  I will see the patient back in 4 weeks for followup unless new problems arise. I think she is ready to begin cardiac rehabilitation.  Sherren Mocha 06/23/2012 1:59 PM

## 2012-06-26 ENCOUNTER — Other Ambulatory Visit: Payer: Medicare Other

## 2012-06-26 ENCOUNTER — Ambulatory Visit: Payer: Medicare Other | Admitting: Neurosurgery

## 2012-06-30 DIAGNOSIS — D649 Anemia, unspecified: Secondary | ICD-10-CM | POA: Diagnosis not present

## 2012-06-30 DIAGNOSIS — IMO0002 Reserved for concepts with insufficient information to code with codable children: Secondary | ICD-10-CM | POA: Diagnosis not present

## 2012-06-30 DIAGNOSIS — M353 Polymyalgia rheumatica: Secondary | ICD-10-CM | POA: Diagnosis not present

## 2012-06-30 DIAGNOSIS — E119 Type 2 diabetes mellitus without complications: Secondary | ICD-10-CM | POA: Diagnosis not present

## 2012-06-30 DIAGNOSIS — R269 Unspecified abnormalities of gait and mobility: Secondary | ICD-10-CM | POA: Diagnosis not present

## 2012-07-01 ENCOUNTER — Ambulatory Visit (INDEPENDENT_AMBULATORY_CARE_PROVIDER_SITE_OTHER): Payer: Medicare Other | Admitting: Internal Medicine

## 2012-07-01 ENCOUNTER — Encounter: Payer: Self-pay | Admitting: Internal Medicine

## 2012-07-01 VITALS — BP 130/80

## 2012-07-01 DIAGNOSIS — IMO0001 Reserved for inherently not codable concepts without codable children: Secondary | ICD-10-CM

## 2012-07-01 DIAGNOSIS — I359 Nonrheumatic aortic valve disorder, unspecified: Secondary | ICD-10-CM | POA: Diagnosis not present

## 2012-07-01 DIAGNOSIS — I350 Nonrheumatic aortic (valve) stenosis: Secondary | ICD-10-CM

## 2012-07-01 DIAGNOSIS — I10 Essential (primary) hypertension: Secondary | ICD-10-CM

## 2012-07-01 LAB — BASIC METABOLIC PANEL
BUN: 45 mg/dL — ABNORMAL HIGH (ref 6–23)
CO2: 25 mEq/L (ref 19–32)
Calcium: 9.3 mg/dL (ref 8.4–10.5)
Creatinine, Ser: 1.6 mg/dL — ABNORMAL HIGH (ref 0.4–1.2)
Glucose, Bld: 94 mg/dL (ref 70–99)

## 2012-07-01 LAB — CBC WITH DIFFERENTIAL/PLATELET
Basophils Relative: 0.8 % (ref 0.0–3.0)
Eosinophils Relative: 3.9 % (ref 0.0–5.0)
HCT: 28.1 % — ABNORMAL LOW (ref 36.0–46.0)
Hemoglobin: 9.5 g/dL — ABNORMAL LOW (ref 12.0–15.0)
Lymphs Abs: 1 10*3/uL (ref 0.7–4.0)
MCV: 87.5 fl (ref 78.0–100.0)
Monocytes Relative: 7.8 % (ref 3.0–12.0)
Neutro Abs: 3.9 10*3/uL (ref 1.4–7.7)
WBC: 5.5 10*3/uL (ref 4.5–10.5)

## 2012-07-01 MED ORDER — GLUCOSE BLOOD VI STRP: 1.0000 | ORAL_STRIP | Freq: Two times a day (BID) | Status: DC

## 2012-07-01 NOTE — Patient Instructions (Signed)
The patient is instructed to continue all medications as prescribed. Schedule followup with check out clerk upon leaving the clinic  

## 2012-07-01 NOTE — Progress Notes (Signed)
Subjective:    Patient ID: Sara Garza, female    DOB: 1923/08/30, 79 y.o.   MRN: [id number]  HPI  Patient had  TAVI in  [place] Monitoring for DM Discussed use of strips and monitoring   Review of Systems  Constitutional: Positive for activity change and appetite change. Negative for fatigue.  HENT: Negative for ear pain, congestion, neck pain, postnasal drip and sinus pressure.   Eyes: Negative for redness and visual disturbance.  Respiratory: Positive for shortness of breath. Negative for cough and wheezing.   Cardiovascular:       Trace edema  Gastrointestinal: Negative for abdominal pain and abdominal distention.  Genitourinary: Negative for dysuria, frequency and menstrual problem.  Musculoskeletal: Negative for myalgias, joint swelling and arthralgias.  Skin: Negative for rash and wound.  Neurological: Negative for dizziness, weakness and headaches.  Hematological: Negative for adenopathy. Does not bruise/bleed easily.  Psychiatric/Behavioral: Negative for sleep disturbance and decreased concentration.   Past Medical History  Diagnosis Date  . Hyperlipidemia   . Hypertension   . Osteoporosis   . PMR (polymyalgia rheumatica)   . Diabetes mellitus   . Arthritis   . Carotid stenosis, left     50-60% stable    History   Social History  . Marital Status: Widowed    Spouse Name: N/A    Number of Children: N/A  . Years of Education: N/A   Occupational History  . Not on file.   Social History Main Topics  . Smoking status: Never Smoker   . Smokeless tobacco: Never Used  . Alcohol Use: Yes     Comment: very seldom  . Drug Use: No  . Sexually Active: Not Currently   Other Topics Concern  . Not on file   Social History Narrative  . No narrative on file    Past Surgical History  Procedure Laterality Date  . Cesarean section    . Appendectomy    . Foot surgery      [name]    Family History  Problem Relation Age of Onset  . Lung cancer    .  COPD Father   . Cancer Father     lung cancer    Allergies  Allergen Reactions  . Statins Other (See Comments)    Muscle aches  . Venlafaxine     REACTION: palpitation    Current Outpatient Prescriptions on File Prior to Visit  Medication Sig Dispense Refill  . Amlodipine-Valsartan-HCTZ (EXFORGE HCT) 5-160-12.5 MG TABS Take 1 tablet by mouth daily.       [name] aspirin 81 MG tablet Take 81 mg by mouth daily.        . bimatoprost (LUMIGAN) 0.03 % ophthalmic drops Place 1 drop into both eyes at bedtime.        . Calcium Carb-Cholecalciferol (CALCIUM 500 +D) 500-400 MG-UNIT TABS Take by mouth daily.        [name] CALCIUM PO Take 1 tablet by mouth daily.      . clopidogrel (PLAVIX) 75 MG tablet Take one tablet daily      . EXFORGE HCT 5-160-12.5 MG TABS TAKE 1 TABLET EVERY DAY  30 tablet  5  . famotidine (PEPCID) 20 MG tablet Take one tablet by mouth twice a day      . ferrous sulfate 325 (65 FE) MG tablet Take 325 mg by mouth daily with breakfast.      . glucose blood (FREESTYLE LITE) test strip 1 each by Other route daily. Use  as instructed  100 each  12  . Krill Oil 500 MG CAPS Take 500 capsules by mouth 3 (three) times daily.      . Lancets (FREESTYLE) lancets USE AS INSTRUCTED  100 each  6  . metFORMIN (GLUCOPHAGE-XR) 500 MG 24 hr tablet Take 500 mg by mouth daily with breakfast.      . Multiple Vitamin (MULTIVITAMIN) tablet Take 1 tablet by mouth daily.        . rosuvastatin (CRESTOR) 20 MG tablet Take 1 tablet (20 mg total) by mouth once a week.  90 tablet  3   No current facility-administered medications on file prior to visit.    There were no vitals taken for this visit.       Objective:   Physical Exam  Nursing note and vitals reviewed. Constitutional: She appears well-developed.  HENT:  Head: Normocephalic and atraumatic.  Eyes: Conjunctivae are normal. Pupils are equal, round, and reactive to light.  Neck: Neck supple.  Cardiovascular:  Murmur heard. AVR sounds  Skin:  Skin is warm and dry.          Assessment & Plan:  DM... CBG's were stable and needs rx for strips HTN stable AVR stable without evidence of CHF Generally doing well and will monitor DM and renal Has moderate deconditioning Anemia noted  And will monitor iron and cbc

## 2012-07-06 NOTE — Progress Notes (Signed)
Pt called and appointment made for labs in 3 weeks

## 2012-07-09 ENCOUNTER — Encounter (HOSPITAL_COMMUNITY)
Admission: RE | Admit: 2012-07-09 | Discharge: 2012-07-09 | Disposition: A | Discharge status: Home / Self Care | Payer: Medicare Other | Source: Ambulatory Visit | Attending: Cardiovascular Disease | Admitting: Cardiovascular Disease

## 2012-07-09 DIAGNOSIS — Z5189 Encounter for other specified aftercare: Secondary | ICD-10-CM | POA: Insufficient documentation

## 2012-07-09 DIAGNOSIS — Z954 Presence of other heart-valve replacement: Secondary | ICD-10-CM | POA: Insufficient documentation

## 2012-07-09 DIAGNOSIS — I359 Nonrheumatic aortic valve disorder, unspecified: Secondary | ICD-10-CM | POA: Insufficient documentation

## 2012-07-09 DIAGNOSIS — I10 Essential (primary) hypertension: Secondary | ICD-10-CM | POA: Insufficient documentation

## 2012-07-09 DIAGNOSIS — E119 Type 2 diabetes mellitus without complications: Secondary | ICD-10-CM | POA: Insufficient documentation

## 2012-07-09 NOTE — Progress Notes (Signed)
Cardiac Rehab Medication Review by a Pharmacist  Does the patient  feel that his/her medications are working for him/her?  yes  Has the patient been experiencing any side effects to the medications prescribed?  no  Does the patient measure his/her own blood pressure or blood glucose at home?  yes   Does the patient have any problems obtaining medications due to transportation or finances?   no  Understanding of regimen: excellent Understanding of indications: excellent Potential of compliance: excellent    Rennie Rouch 07/09/2012 8:27 AM

## 2012-07-13 ENCOUNTER — Encounter (HOSPITAL_COMMUNITY)
Admission: RE | Admit: 2012-07-13 | Discharge: 2012-07-13 | Disposition: A | Discharge status: Home / Self Care | Payer: Medicare Other | Source: Ambulatory Visit | Attending: Cardiovascular Disease | Admitting: Cardiovascular Disease

## 2012-07-13 DIAGNOSIS — I359 Nonrheumatic aortic valve disorder, unspecified: Secondary | ICD-10-CM | POA: Diagnosis not present

## 2012-07-13 DIAGNOSIS — I10 Essential (primary) hypertension: Secondary | ICD-10-CM | POA: Diagnosis not present

## 2012-07-13 DIAGNOSIS — Z5189 Encounter for other specified aftercare: Secondary | ICD-10-CM | POA: Diagnosis not present

## 2012-07-13 DIAGNOSIS — E119 Type 2 diabetes mellitus without complications: Secondary | ICD-10-CM | POA: Diagnosis not present

## 2012-07-13 DIAGNOSIS — Z954 Presence of other heart-valve replacement: Secondary | ICD-10-CM | POA: Diagnosis not present

## 2012-07-13 LAB — GLUCOSE, CAPILLARY: Glucose-Capillary: 139 mg/dL — ABNORMAL HIGH (ref 70–99)

## 2012-07-13 NOTE — Progress Notes (Signed)
Pt in today for her first day of exercise.  Pt pre exercise blood sugar checked 76. Pt given gingerale.  Unable to proceed with exercise due to cardiac rehab policy. Rechecked pt after 20 minutes - 139. Pt does not have a good foundation of knowledge in regards to diabetes.  Pt thought the lower the blood sugar the better it was.  Pt did not check her blood sugar this morning at home but remarks that her blood sugar reading on yesterday was 98.  Pt takes metformin for her diabetes but doesn't understand how it works and signs and symptoms of low blood sugar.  Pt has not received any education on diabetes management.  Plan to have Sara Garza, diabetes educator work closely with pt.  Pt advised of the the next diabetes education class to attend. Pt not planning to return directly home - pt given crackers with cheese and greek yogurt.

## 2012-07-14 ENCOUNTER — Ambulatory Visit (INDEPENDENT_AMBULATORY_CARE_PROVIDER_SITE_OTHER): Payer: Medicare Other | Admitting: *Deleted

## 2012-07-14 DIAGNOSIS — I10 Essential (primary) hypertension: Secondary | ICD-10-CM | POA: Diagnosis not present

## 2012-07-14 LAB — CBC WITH DIFFERENTIAL/PLATELET
Basophils Absolute: 0 10*3/uL (ref 0.0–0.1)
HCT: 30 % — ABNORMAL LOW (ref 36.0–46.0)
Hemoglobin: 10.2 g/dL — ABNORMAL LOW (ref 12.0–15.0)
Lymphs Abs: 0.9 10*3/uL (ref 0.7–4.0)
MCHC: 34 g/dL (ref 30.0–36.0)
MCV: 88 fl (ref 78.0–100.0)
Monocytes Absolute: 0.5 10*3/uL (ref 0.1–1.0)
Monocytes Relative: 10.9 % (ref 3.0–12.0)
Neutro Abs: 3 10*3/uL (ref 1.4–7.7)
RDW: 14.2 % (ref 11.5–14.6)

## 2012-07-14 LAB — BASIC METABOLIC PANEL
CO2: 21 mEq/L (ref 19–32)
Calcium: 9.4 mg/dL (ref 8.4–10.5)
Creatinine, Ser: 1.5 mg/dL — ABNORMAL HIGH (ref 0.4–1.2)
Glucose, Bld: 100 mg/dL — ABNORMAL HIGH (ref 70–99)

## 2012-07-15 ENCOUNTER — Encounter (HOSPITAL_COMMUNITY)
Admission: RE | Admit: 2012-07-15 | Discharge: 2012-07-15 | Disposition: A | Discharge status: Home / Self Care | Payer: Medicare Other | Source: Ambulatory Visit | Attending: Cardiovascular Disease | Admitting: Cardiovascular Disease

## 2012-07-15 DIAGNOSIS — I359 Nonrheumatic aortic valve disorder, unspecified: Secondary | ICD-10-CM | POA: Diagnosis not present

## 2012-07-15 DIAGNOSIS — Z954 Presence of other heart-valve replacement: Secondary | ICD-10-CM | POA: Diagnosis not present

## 2012-07-15 DIAGNOSIS — I10 Essential (primary) hypertension: Secondary | ICD-10-CM | POA: Diagnosis not present

## 2012-07-15 DIAGNOSIS — E119 Type 2 diabetes mellitus without complications: Secondary | ICD-10-CM | POA: Diagnosis not present

## 2012-07-15 DIAGNOSIS — Z5189 Encounter for other specified aftercare: Secondary | ICD-10-CM | POA: Diagnosis not present

## 2012-07-15 LAB — GLUCOSE, CAPILLARY: Glucose-Capillary: 89 mg/dL (ref 70–99)

## 2012-07-15 NOTE — Progress Notes (Signed)
Pt in today for first day of exercise.  Pt pre exercise blood glucose - 92.  Pt omitted her metformin this morning because she thinks she no longer needs to take it and wonders if she still is a diabetic.  Pt given yogurt to eat prior to the start of exercise.  Pt generally eats breakfast at 8am.  Instructed pt to eat a snack prior to leaving home to attend exercise.  Pt verbalized understanding.  Pt post exercise first reading was 89 with recheck of 101.  Pt plans to hold her metformin on Thursday and Friday.  Will check again on Friday and closely monitor blood glucose readings.  Monitor showed sr with slight st depression. No complaints during exercise.

## 2012-07-17 ENCOUNTER — Other Ambulatory Visit: Payer: Self-pay

## 2012-07-17 ENCOUNTER — Encounter (HOSPITAL_COMMUNITY)
Admission: RE | Admit: 2012-07-17 | Discharge: 2012-07-17 | Disposition: A | Discharge status: Home / Self Care | Payer: Medicare Other | Source: Ambulatory Visit | Attending: Cardiovascular Disease | Admitting: Cardiovascular Disease

## 2012-07-17 ENCOUNTER — Encounter: Payer: Self-pay | Admitting: Cardiovascular Disease

## 2012-07-17 DIAGNOSIS — I359 Nonrheumatic aortic valve disorder, unspecified: Secondary | ICD-10-CM | POA: Diagnosis not present

## 2012-07-17 DIAGNOSIS — E119 Type 2 diabetes mellitus without complications: Secondary | ICD-10-CM | POA: Diagnosis not present

## 2012-07-17 DIAGNOSIS — I10 Essential (primary) hypertension: Secondary | ICD-10-CM | POA: Diagnosis not present

## 2012-07-17 DIAGNOSIS — Z954 Presence of other heart-valve replacement: Secondary | ICD-10-CM | POA: Diagnosis not present

## 2012-07-17 DIAGNOSIS — Z5189 Encounter for other specified aftercare: Secondary | ICD-10-CM | POA: Diagnosis not present

## 2012-07-17 LAB — GLUCOSE, CAPILLARY: Glucose-Capillary: 73 mg/dL (ref 70–99)

## 2012-07-17 NOTE — Telephone Encounter (Signed)
This encounter was created in error - please disregard.

## 2012-07-17 NOTE — Telephone Encounter (Signed)
Follow-up: ° ° ° °Patient called in returning your call.  Please call back. °

## 2012-07-20 ENCOUNTER — Encounter (HOSPITAL_COMMUNITY)
Admission: RE | Admit: 2012-07-20 | Discharge: 2012-07-20 | Disposition: A | Discharge status: Home / Self Care | Payer: Medicare Other | Source: Ambulatory Visit | Attending: Cardiovascular Disease | Admitting: Cardiovascular Disease

## 2012-07-20 DIAGNOSIS — Z954 Presence of other heart-valve replacement: Secondary | ICD-10-CM | POA: Diagnosis not present

## 2012-07-20 DIAGNOSIS — E119 Type 2 diabetes mellitus without complications: Secondary | ICD-10-CM | POA: Diagnosis not present

## 2012-07-20 DIAGNOSIS — I10 Essential (primary) hypertension: Secondary | ICD-10-CM | POA: Diagnosis not present

## 2012-07-20 DIAGNOSIS — Z5189 Encounter for other specified aftercare: Secondary | ICD-10-CM | POA: Diagnosis not present

## 2012-07-20 DIAGNOSIS — I359 Nonrheumatic aortic valve disorder, unspecified: Secondary | ICD-10-CM | POA: Diagnosis not present

## 2012-07-20 NOTE — Progress Notes (Signed)
[id number] Reviewed home exercise guidelines with patient including endpoints, temperature precautions, target heart rate and rate of perceived exertion. Pt plans to walk as her mode of home exercise. Pt voices understanding of instructions given.  Seward Carol, MS, ACSM CES

## 2012-07-22 ENCOUNTER — Encounter (HOSPITAL_COMMUNITY)
Admission: RE | Admit: 2012-07-22 | Discharge: 2012-07-22 | Disposition: A | Discharge status: Home / Self Care | Payer: Medicare Other | Source: Ambulatory Visit | Attending: Cardiovascular Disease | Admitting: Cardiovascular Disease

## 2012-07-22 DIAGNOSIS — I359 Nonrheumatic aortic valve disorder, unspecified: Secondary | ICD-10-CM | POA: Diagnosis not present

## 2012-07-22 DIAGNOSIS — I10 Essential (primary) hypertension: Secondary | ICD-10-CM | POA: Diagnosis not present

## 2012-07-22 DIAGNOSIS — Z5189 Encounter for other specified aftercare: Secondary | ICD-10-CM | POA: Diagnosis not present

## 2012-07-22 DIAGNOSIS — Z954 Presence of other heart-valve replacement: Secondary | ICD-10-CM | POA: Diagnosis not present

## 2012-07-22 DIAGNOSIS — E119 Type 2 diabetes mellitus without complications: Secondary | ICD-10-CM | POA: Diagnosis not present

## 2012-07-22 LAB — GLUCOSE, CAPILLARY: Glucose-Capillary: 80 mg/dL (ref 70–99)

## 2012-07-22 NOTE — Progress Notes (Signed)
Pt mentioned to me that she has some knee pain with her walking at home. Discussed with pt accumulating 30 minutes per day by walking 10 minutes 3x's per day. Pt was amenable to this and notes that she is feeling better since she has been exercising in the program.  Seward Carol, MS, ACSM CES

## 2012-07-24 ENCOUNTER — Encounter: Payer: Self-pay | Admitting: Cardiovascular Disease

## 2012-07-24 ENCOUNTER — Encounter (HOSPITAL_COMMUNITY)
Admission: RE | Admit: 2012-07-24 | Discharge: 2012-07-24 | Disposition: A | Discharge status: Home / Self Care | Payer: Medicare Other | Source: Ambulatory Visit | Attending: Cardiovascular Disease | Admitting: Cardiovascular Disease

## 2012-07-24 ENCOUNTER — Ambulatory Visit: Payer: Medicare Other | Admitting: Internal Medicine

## 2012-07-24 ENCOUNTER — Ambulatory Visit (INDEPENDENT_AMBULATORY_CARE_PROVIDER_SITE_OTHER): Payer: Medicare Other | Admitting: Cardiovascular Disease

## 2012-07-24 ENCOUNTER — Other Ambulatory Visit: Payer: Self-pay | Admitting: *Deleted

## 2012-07-24 VITALS — BP 132/60 | HR 66 | Ht 60.0 in | Wt 163.0 lb

## 2012-07-24 DIAGNOSIS — I359 Nonrheumatic aortic valve disorder, unspecified: Secondary | ICD-10-CM | POA: Diagnosis not present

## 2012-07-24 DIAGNOSIS — E119 Type 2 diabetes mellitus without complications: Secondary | ICD-10-CM | POA: Diagnosis not present

## 2012-07-24 DIAGNOSIS — Z954 Presence of other heart-valve replacement: Secondary | ICD-10-CM | POA: Diagnosis not present

## 2012-07-24 DIAGNOSIS — I10 Essential (primary) hypertension: Secondary | ICD-10-CM | POA: Diagnosis not present

## 2012-07-24 DIAGNOSIS — Z5189 Encounter for other specified aftercare: Secondary | ICD-10-CM | POA: Diagnosis not present

## 2012-07-24 LAB — GLUCOSE, CAPILLARY
Glucose-Capillary: 76 mg/dL (ref 70–99)
Glucose-Capillary: 96 mg/dL (ref 70–99)

## 2012-07-24 MED ORDER — AMOXICILLIN 500 MG PO TABS: ORAL_TABLET | ORAL | Status: DC

## 2012-07-24 NOTE — Patient Instructions (Addendum)
Your physician recommends that you schedule a follow-up appointment in: 3 MONTHS with Dr Burt Knack  Your physician recommends that you continue on your current medications as directed. Please refer to the Current Medication list given to you today.  You can stop Plavix at the end of July. Prescription for Amoxicillin 500mg  take 4 tablets one hour prior to dental appointment sent to pharmacy.

## 2012-07-24 NOTE — Telephone Encounter (Signed)
Per dr Arnoldo Morale stop glucophage- pt informed

## 2012-07-26 ENCOUNTER — Encounter: Payer: Self-pay | Admitting: Cardiovascular Disease

## 2012-07-26 NOTE — Progress Notes (Signed)
   HPI:  Sara Garza returns for follow-up today. She is a delightful 79 year-old woman who underwent [name] 04/29/2012 for treatment of severe symptomatic AS. She is doing really well and denies CP, edema, lightheadedness or palpitations. She admits to mild dyspnea with exertion but greatly improved from past. She is pleased with her current functional status. She notes easy bruising on ASA and plavix. No other complaints today. Notes metformin was d/c'd because of low blood sugars.  Outpatient Encounter Prescriptions as of 07/24/2012  Medication Sig Dispense Refill  . aspirin 81 MG tablet Take 81 mg by mouth daily.        . bimatoprost (LUMIGAN) 0.01 % SOLN 1 drop at bedtime.      . Calcium Carb-Cholecalciferol (CALCIUM 500 +D) 500-400 MG-UNIT TABS Take by mouth 3 (three) times daily after meals.       . clopidogrel (PLAVIX) 75 MG tablet Take one tablet daily      . docusate sodium (COLACE) 100 MG capsule Take 100 mg by mouth 2 (two) times daily.      [name] EXFORGE HCT 5-160-12.5 MG TABS TAKE 1 TABLET EVERY DAY  30 tablet  5  . ferrous sulfate 325 (65 FE) MG tablet Take 325 mg by mouth daily with breakfast.      . Krill Oil 500 MG CAPS Take 500 capsules by mouth 3 (three) times daily.      . Multiple Vitamin (MULTIVITAMIN) tablet Take 1 tablet by mouth daily.        [name] amoxicillin (AMOXIL) 500 MG tablet Take 4 tablets one hour prior to dental procedures  8 tablet  2  . [DISCONTINUED] bimatoprost (LUMIGAN) 0.03 % ophthalmic drops Place 1 drop into both eyes at bedtime.        . [DISCONTINUED] glucose blood (FREESTYLE LITE) test strip 1 each by Other route 2 (two) times daily. Use as instructed  100 each  12  . [DISCONTINUED] Lancets (FREESTYLE) lancets USE AS INSTRUCTED  100 each  6   No facility-administered encounter medications on file as of 07/24/2012.    Allergies  Allergen Reactions  . Statins Other (See Comments)    Muscle aches    Past Medical History  Diagnosis Date  . Hyperlipidemia   .  Hypertension   . Osteoporosis   . PMR (polymyalgia rheumatica)   . Diabetes mellitus   . Arthritis   . Carotid stenosis, left     50-60% stable   BP 132/60  Pulse 66  Ht 5' (1.524 m)  Wt 73.936 kg (163 lb)  BMI 31.83 kg/m2  SpO2 98%  PHYSICAL EXAM: Pt is alert and oriented, pleasant elderly woman in NAD HEENT: normal Neck: JVP - normal, carotids 2+= without bruits Lungs: CTA bilaterally CV: RRR with soft systolic murmur at the LSB Abd: soft, NT, Positive BS, no hepatomegaly Ext: no C/C/E, distal pulses intact and equal Skin: warm/dry no rash  ASSESSMENT AND PLAN: 1. Severe AS s/p [name] - valve functioning normally, dramatic improvement in functional status. Continue plavix another 3 months, then stop. Lifelong ASA 81 mg. SBE prophylaxis for dental work Rx written for Amoxil no penicillin allergy.  2. CAD, native vessel. No anginal symptoms. Continue med management.  3. HTN - BP controlled on Exforge/HCT.  Follow-up: 6 months.  Sherren Mocha 07/26/2012 8:31 AM

## 2012-07-27 ENCOUNTER — Other Ambulatory Visit (INDEPENDENT_AMBULATORY_CARE_PROVIDER_SITE_OTHER): Payer: Medicare Other

## 2012-07-27 ENCOUNTER — Encounter (HOSPITAL_COMMUNITY)
Admission: RE | Admit: 2012-07-27 | Discharge: 2012-07-27 | Disposition: A | Discharge status: Home / Self Care | Payer: Medicare Other | Source: Ambulatory Visit | Attending: Cardiovascular Disease | Admitting: Cardiovascular Disease

## 2012-07-27 DIAGNOSIS — Z954 Presence of other heart-valve replacement: Secondary | ICD-10-CM | POA: Diagnosis not present

## 2012-07-27 DIAGNOSIS — D509 Iron deficiency anemia, unspecified: Secondary | ICD-10-CM

## 2012-07-27 DIAGNOSIS — Z5189 Encounter for other specified aftercare: Secondary | ICD-10-CM | POA: Diagnosis not present

## 2012-07-27 DIAGNOSIS — E119 Type 2 diabetes mellitus without complications: Secondary | ICD-10-CM | POA: Diagnosis not present

## 2012-07-27 DIAGNOSIS — I359 Nonrheumatic aortic valve disorder, unspecified: Secondary | ICD-10-CM | POA: Diagnosis not present

## 2012-07-27 DIAGNOSIS — I10 Essential (primary) hypertension: Secondary | ICD-10-CM | POA: Diagnosis not present

## 2012-07-27 LAB — CBC WITH DIFFERENTIAL/PLATELET
Basophils Absolute: 0 10*3/uL (ref 0.0–0.1)
Eosinophils Relative: 3.7 % (ref 0.0–5.0)
HCT: 30.9 % — ABNORMAL LOW (ref 36.0–46.0)
Lymphs Abs: 1.3 10*3/uL (ref 0.7–4.0)
MCV: 89.1 fl (ref 78.0–100.0)
Monocytes Absolute: 0.5 10*3/uL (ref 0.1–1.0)
Monocytes Relative: 8.9 % (ref 3.0–12.0)
Neutrophils Relative %: 65.1 % (ref 43.0–77.0)
Platelets: 185 10*3/uL (ref 150.0–400.0)
RDW: 14.4 % (ref 11.5–14.6)
WBC: 6.1 10*3/uL (ref 4.5–10.5)

## 2012-07-27 LAB — RETICULOCYTES
RBC.: 3.5 MIL/uL — ABNORMAL LOW (ref 3.87–5.11)
Retic Ct Pct: 1 % (ref 0.4–2.3)

## 2012-07-27 LAB — IRON: Iron: 71 ug/dL (ref 42–145)

## 2012-07-27 LAB — GLUCOSE, CAPILLARY: Glucose-Capillary: 116 mg/dL — ABNORMAL HIGH (ref 70–99)

## 2012-07-29 ENCOUNTER — Encounter (HOSPITAL_COMMUNITY)
Admission: RE | Admit: 2012-07-29 | Discharge: 2012-07-29 | Disposition: A | Discharge status: Home / Self Care | Payer: Medicare Other | Source: Ambulatory Visit | Attending: Cardiovascular Disease | Admitting: Cardiovascular Disease

## 2012-07-29 DIAGNOSIS — I10 Essential (primary) hypertension: Secondary | ICD-10-CM | POA: Diagnosis not present

## 2012-07-29 DIAGNOSIS — I359 Nonrheumatic aortic valve disorder, unspecified: Secondary | ICD-10-CM | POA: Diagnosis not present

## 2012-07-29 DIAGNOSIS — Z5189 Encounter for other specified aftercare: Secondary | ICD-10-CM | POA: Diagnosis not present

## 2012-07-29 DIAGNOSIS — E119 Type 2 diabetes mellitus without complications: Secondary | ICD-10-CM | POA: Diagnosis not present

## 2012-07-29 DIAGNOSIS — Z954 Presence of other heart-valve replacement: Secondary | ICD-10-CM | POA: Diagnosis not present

## 2012-07-31 ENCOUNTER — Encounter (HOSPITAL_COMMUNITY)
Admission: RE | Admit: 2012-07-31 | Discharge: 2012-07-31 | Disposition: A | Discharge status: Home / Self Care | Payer: Medicare Other | Source: Ambulatory Visit | Attending: Cardiovascular Disease | Admitting: Cardiovascular Disease

## 2012-07-31 DIAGNOSIS — I10 Essential (primary) hypertension: Secondary | ICD-10-CM | POA: Diagnosis not present

## 2012-07-31 DIAGNOSIS — E119 Type 2 diabetes mellitus without complications: Secondary | ICD-10-CM | POA: Diagnosis not present

## 2012-07-31 DIAGNOSIS — I359 Nonrheumatic aortic valve disorder, unspecified: Secondary | ICD-10-CM | POA: Diagnosis not present

## 2012-07-31 DIAGNOSIS — Z954 Presence of other heart-valve replacement: Secondary | ICD-10-CM | POA: Diagnosis not present

## 2012-07-31 DIAGNOSIS — Z5189 Encounter for other specified aftercare: Secondary | ICD-10-CM | POA: Diagnosis not present

## 2012-07-31 LAB — GLUCOSE, CAPILLARY: Glucose-Capillary: 106 mg/dL — ABNORMAL HIGH (ref 70–99)

## 2012-08-03 ENCOUNTER — Encounter (HOSPITAL_COMMUNITY)
Admission: RE | Admit: 2012-08-03 | Discharge: 2012-08-03 | Disposition: A | Discharge status: Home / Self Care | Payer: Medicare Other | Source: Ambulatory Visit | Attending: Cardiovascular Disease | Admitting: Cardiovascular Disease

## 2012-08-03 NOTE — Progress Notes (Signed)
Sara Garza 79 y.o. female Nutrition Note Spoke with pt.  Nutrition Plan and Nutrition Survey goals reviewed with pt. Pt is following Step 2 of the Therapeutic Lifestyle Changes diet. Pt wants to lose wt to a goal wt of "160 or a little less." Pt denies currently trying to lose wt "other than exercising, which is new for me." Pt states her wt today 74.8 kg, which is up 3.4 kg from orientation. Pt wt gain likely to increased appetite with exercise. Wt loss discussed.  Pt is diabetic. Last A1c indicates blood glucose well-controlled. Pt is currently off of her Metformin and has been maintaining fasting CBG's "around 102 mg/dL" per pt. This Probation officer went over Diabetes Education test results. Pt states her answers were "a guess." Pt expressed understanding of the information reviewed. Pt aware of nutrition education classes offered and plans on attending nutrition classes.  Nutrition Diagnosis   Food-and nutrition-related knowledge deficit related to lack of exposure to information as related to diagnosis of: ? CVD ? DM (A1c 6.0)   Obesity related to excessive energy intake as evidenced by a BMI of 32.9  Nutrition RX/ Estimated Daily Nutrition Needs for: wt loss  1200 Kcal, 30 gm fat, 8-9 gm sat fat, 1.1-1.2 gm trans-fat, <1500 mg sodium, 150 gm CHO   Nutrition Intervention   Pt's individual nutrition plan reviewed with pt.   Benefits of adopting Therapeutic Lifestyle Changes discussed when Medficts reviewed.   Pt to attend the Portion Distortion class - met 07/22/12   Pt to attend the  ? Nutrition I class                     ? Nutrition II class        ? Diabetes Blitz class       ? Diabetes Q & A class - met 07/10/12   Continue client-centered nutrition education by RD, as part of interdisciplinary care. Goal(s)   Pt to identify food quantities necessary to achieve: ? wt loss to a goal wt of 133-151 lb (60.5-68.7 kg) at graduation from cardiac rehab.   Monitor and Evaluate progress toward  nutrition goal with team. Nutrition Risk: Change to Moderate   Derek Mound, M.Ed, RD, LDN, CDE 08/03/2012 12:11 PM

## 2012-08-05 ENCOUNTER — Encounter (HOSPITAL_COMMUNITY)
Admission: RE | Admit: 2012-08-05 | Discharge: 2012-08-05 | Disposition: A | Discharge status: Home / Self Care | Payer: Medicare Other | Source: Ambulatory Visit | Attending: Cardiovascular Disease | Admitting: Cardiovascular Disease

## 2012-08-05 LAB — GLUCOSE, CAPILLARY: Glucose-Capillary: 101 mg/dL — ABNORMAL HIGH (ref 70–99)

## 2012-08-07 ENCOUNTER — Encounter (HOSPITAL_COMMUNITY)
Admission: RE | Admit: 2012-08-07 | Discharge: 2012-08-07 | Disposition: A | Discharge status: Home / Self Care | Payer: Medicare Other | Source: Ambulatory Visit | Attending: Cardiovascular Disease | Admitting: Cardiovascular Disease

## 2012-08-10 ENCOUNTER — Encounter (HOSPITAL_COMMUNITY)
Admission: RE | Admit: 2012-08-10 | Discharge: 2012-08-10 | Disposition: A | Discharge status: Home / Self Care | Payer: Medicare Other | Source: Ambulatory Visit | Attending: Cardiovascular Disease | Admitting: Cardiovascular Disease

## 2012-08-10 LAB — GLUCOSE, CAPILLARY: Glucose-Capillary: 103 mg/dL — ABNORMAL HIGH (ref 70–99)

## 2012-08-12 ENCOUNTER — Encounter (HOSPITAL_COMMUNITY)
Admission: RE | Admit: 2012-08-12 | Discharge: 2012-08-12 | Disposition: A | Discharge status: Home / Self Care | Payer: Medicare Other | Source: Ambulatory Visit | Attending: Cardiovascular Disease | Admitting: Cardiovascular Disease

## 2012-08-14 ENCOUNTER — Encounter (HOSPITAL_COMMUNITY)
Admission: RE | Admit: 2012-08-14 | Discharge: 2012-08-14 | Disposition: A | Discharge status: Home / Self Care | Payer: Medicare Other | Source: Ambulatory Visit | Attending: Cardiovascular Disease | Admitting: Cardiovascular Disease

## 2012-08-17 ENCOUNTER — Encounter (HOSPITAL_COMMUNITY)
Admission: RE | Admit: 2012-08-17 | Discharge: 2012-08-17 | Disposition: A | Discharge status: Home / Self Care | Payer: Medicare Other | Source: Ambulatory Visit | Attending: Cardiovascular Disease | Admitting: Cardiovascular Disease

## 2012-08-19 ENCOUNTER — Encounter (HOSPITAL_COMMUNITY)
Admission: RE | Admit: 2012-08-19 | Discharge: 2012-08-19 | Disposition: A | Discharge status: Home / Self Care | Payer: Medicare Other | Source: Ambulatory Visit | Attending: Cardiovascular Disease | Admitting: Cardiovascular Disease

## 2012-08-21 ENCOUNTER — Encounter (HOSPITAL_COMMUNITY)
Admission: RE | Admit: 2012-08-21 | Discharge: 2012-08-21 | Disposition: A | Discharge status: Home / Self Care | Payer: Medicare Other | Source: Ambulatory Visit | Attending: Cardiovascular Disease | Admitting: Cardiovascular Disease

## 2012-08-24 ENCOUNTER — Encounter (HOSPITAL_COMMUNITY): Payer: Medicare Other

## 2012-08-25 ENCOUNTER — Other Ambulatory Visit: Payer: Self-pay | Admitting: Cardiovascular Disease

## 2012-08-26 ENCOUNTER — Encounter (HOSPITAL_COMMUNITY)
Admission: RE | Admit: 2012-08-26 | Discharge: 2012-08-26 | Disposition: A | Discharge status: Home / Self Care | Payer: Medicare Other | Source: Ambulatory Visit | Attending: Cardiovascular Disease | Admitting: Cardiovascular Disease

## 2012-08-28 ENCOUNTER — Encounter (HOSPITAL_COMMUNITY)
Admission: RE | Admit: 2012-08-28 | Discharge: 2012-08-28 | Disposition: A | Discharge status: Home / Self Care | Payer: Medicare Other | Source: Ambulatory Visit | Attending: Cardiovascular Disease | Admitting: Cardiovascular Disease

## 2012-08-31 ENCOUNTER — Encounter (HOSPITAL_COMMUNITY)
Admission: RE | Admit: 2012-08-31 | Discharge: 2012-08-31 | Disposition: A | Discharge status: Home / Self Care | Payer: Medicare Other | Source: Ambulatory Visit | Attending: Cardiovascular Disease | Admitting: Cardiovascular Disease

## 2012-08-31 DIAGNOSIS — Z954 Presence of other heart-valve replacement: Secondary | ICD-10-CM | POA: Diagnosis not present

## 2012-08-31 DIAGNOSIS — Z5189 Encounter for other specified aftercare: Secondary | ICD-10-CM | POA: Diagnosis not present

## 2012-08-31 DIAGNOSIS — I10 Essential (primary) hypertension: Secondary | ICD-10-CM | POA: Diagnosis not present

## 2012-08-31 DIAGNOSIS — E119 Type 2 diabetes mellitus without complications: Secondary | ICD-10-CM | POA: Insufficient documentation

## 2012-08-31 DIAGNOSIS — I359 Nonrheumatic aortic valve disorder, unspecified: Secondary | ICD-10-CM | POA: Insufficient documentation

## 2012-09-02 ENCOUNTER — Other Ambulatory Visit: Payer: Self-pay

## 2012-09-02 ENCOUNTER — Other Ambulatory Visit: Payer: Self-pay | Admitting: Cardiovascular Disease

## 2012-09-02 ENCOUNTER — Encounter (HOSPITAL_COMMUNITY)
Admission: RE | Admit: 2012-09-02 | Discharge: 2012-09-02 | Disposition: A | Discharge status: Home / Self Care | Payer: Medicare Other | Source: Ambulatory Visit | Attending: Cardiovascular Disease | Admitting: Cardiovascular Disease

## 2012-09-02 LAB — GLUCOSE, CAPILLARY: Glucose-Capillary: 73 mg/dL (ref 70–99)

## 2012-09-04 ENCOUNTER — Encounter (HOSPITAL_COMMUNITY)
Admission: RE | Admit: 2012-09-04 | Discharge: 2012-09-04 | Disposition: A | Discharge status: Home / Self Care | Payer: Medicare Other | Source: Ambulatory Visit | Attending: Cardiovascular Disease | Admitting: Cardiovascular Disease

## 2012-09-07 ENCOUNTER — Encounter (HOSPITAL_COMMUNITY)
Admission: RE | Admit: 2012-09-07 | Discharge: 2012-09-07 | Disposition: A | Discharge status: Home / Self Care | Payer: Medicare Other | Source: Ambulatory Visit | Attending: Cardiovascular Disease | Admitting: Cardiovascular Disease

## 2012-09-07 LAB — GLUCOSE, CAPILLARY
Glucose-Capillary: 65 mg/dL — ABNORMAL LOW (ref 70–99)
Glucose-Capillary: 91 mg/dL (ref 70–99)

## 2012-09-07 NOTE — Progress Notes (Signed)
Post exercise CBG 65. Patient complained of feeling a little lightheaded.  Patient given lemonade and graham crackers.  Repeat CBG 91. Sara Garza said she ate breakfast this morning at 0830. Patient left without complaints or symptoms.  Will continue to monitor the patient throughout  the program. Will fax today's event to Dr Arnoldo Morale office for review.

## 2012-09-09 ENCOUNTER — Encounter (HOSPITAL_COMMUNITY)
Admission: RE | Admit: 2012-09-09 | Discharge: 2012-09-09 | Disposition: A | Discharge status: Home / Self Care | Payer: Medicare Other | Source: Ambulatory Visit | Attending: Cardiovascular Disease | Admitting: Cardiovascular Disease

## 2012-09-09 LAB — GLUCOSE, CAPILLARY: Glucose-Capillary: 102 mg/dL — ABNORMAL HIGH (ref 70–99)

## 2012-09-11 ENCOUNTER — Encounter (HOSPITAL_COMMUNITY)
Admission: RE | Admit: 2012-09-11 | Discharge: 2012-09-11 | Disposition: A | Discharge status: Home / Self Care | Payer: Medicare Other | Source: Ambulatory Visit | Attending: Cardiovascular Disease | Admitting: Cardiovascular Disease

## 2012-09-11 LAB — GLUCOSE, CAPILLARY: Glucose-Capillary: 99 mg/dL (ref 70–99)

## 2012-09-14 ENCOUNTER — Encounter (HOSPITAL_COMMUNITY)
Admission: RE | Admit: 2012-09-14 | Discharge: 2012-09-14 | Disposition: A | Discharge status: Home / Self Care | Payer: Medicare Other | Source: Ambulatory Visit | Attending: Cardiovascular Disease | Admitting: Cardiovascular Disease

## 2012-09-14 LAB — GLUCOSE, CAPILLARY: Glucose-Capillary: 106 mg/dL — ABNORMAL HIGH (ref 70–99)

## 2012-09-16 ENCOUNTER — Encounter (HOSPITAL_COMMUNITY)
Admission: RE | Admit: 2012-09-16 | Discharge: 2012-09-16 | Disposition: A | Discharge status: Home / Self Care | Payer: Medicare Other | Source: Ambulatory Visit | Attending: Cardiovascular Disease | Admitting: Cardiovascular Disease

## 2012-09-18 ENCOUNTER — Encounter (HOSPITAL_COMMUNITY)
Admission: RE | Admit: 2012-09-18 | Discharge: 2012-09-18 | Disposition: A | Discharge status: Home / Self Care | Payer: Medicare Other | Source: Ambulatory Visit | Attending: Cardiovascular Disease | Admitting: Cardiovascular Disease

## 2012-09-18 LAB — GLUCOSE, CAPILLARY: Glucose-Capillary: 130 mg/dL — ABNORMAL HIGH (ref 70–99)

## 2012-09-21 ENCOUNTER — Encounter (HOSPITAL_COMMUNITY)
Admission: RE | Admit: 2012-09-21 | Discharge: 2012-09-21 | Disposition: A | Discharge status: Home / Self Care | Payer: Medicare Other | Source: Ambulatory Visit | Attending: Cardiovascular Disease | Admitting: Cardiovascular Disease

## 2012-09-23 ENCOUNTER — Encounter (HOSPITAL_COMMUNITY)
Admission: RE | Admit: 2012-09-23 | Discharge: 2012-09-23 | Disposition: A | Discharge status: Home / Self Care | Payer: Medicare Other | Source: Ambulatory Visit | Attending: Cardiovascular Disease | Admitting: Cardiovascular Disease

## 2012-09-25 ENCOUNTER — Encounter (HOSPITAL_COMMUNITY)
Admission: RE | Admit: 2012-09-25 | Discharge: 2012-09-25 | Disposition: A | Discharge status: Home / Self Care | Payer: Medicare Other | Source: Ambulatory Visit | Attending: Cardiovascular Disease | Admitting: Cardiovascular Disease

## 2012-09-25 LAB — GLUCOSE, CAPILLARY
Glucose-Capillary: 74 mg/dL (ref 70–99)
Glucose-Capillary: 112 mg/dL — ABNORMAL HIGH (ref 70–99)

## 2012-09-28 ENCOUNTER — Encounter (HOSPITAL_COMMUNITY): Payer: Medicare Other

## 2012-09-30 ENCOUNTER — Encounter (HOSPITAL_COMMUNITY)
Admission: RE | Admit: 2012-09-30 | Discharge: 2012-09-30 | Disposition: A | Discharge status: Home / Self Care | Payer: Medicare Other | Source: Ambulatory Visit | Attending: Cardiovascular Disease | Admitting: Cardiovascular Disease

## 2012-09-30 DIAGNOSIS — Z5189 Encounter for other specified aftercare: Secondary | ICD-10-CM | POA: Insufficient documentation

## 2012-09-30 DIAGNOSIS — E119 Type 2 diabetes mellitus without complications: Secondary | ICD-10-CM | POA: Diagnosis not present

## 2012-09-30 DIAGNOSIS — I359 Nonrheumatic aortic valve disorder, unspecified: Secondary | ICD-10-CM | POA: Diagnosis not present

## 2012-09-30 DIAGNOSIS — I10 Essential (primary) hypertension: Secondary | ICD-10-CM | POA: Insufficient documentation

## 2012-09-30 DIAGNOSIS — Z954 Presence of other heart-valve replacement: Secondary | ICD-10-CM | POA: Diagnosis not present

## 2012-09-30 LAB — GLUCOSE, CAPILLARY: Glucose-Capillary: 110 mg/dL — ABNORMAL HIGH (ref 70–99)

## 2012-10-01 ENCOUNTER — Telehealth: Payer: Self-pay | Admitting: Internal Medicine

## 2012-10-01 NOTE — Telephone Encounter (Signed)
Pt would like to know should she continue her ferrous sulfate prescribed by Dr Burt Knack if so please call refill into [facility]. Pt said she does not get med OTC. Pt is aware MD out of office

## 2012-10-02 ENCOUNTER — Encounter (HOSPITAL_COMMUNITY): Payer: Medicare Other

## 2012-10-05 ENCOUNTER — Encounter (HOSPITAL_COMMUNITY)
Admission: RE | Admit: 2012-10-05 | Discharge: 2012-10-05 | Disposition: A | Discharge status: Home / Self Care | Payer: Medicare Other | Source: Ambulatory Visit | Attending: Cardiovascular Disease | Admitting: Cardiovascular Disease

## 2012-10-05 LAB — GLUCOSE, CAPILLARY: Glucose-Capillary: 98 mg/dL (ref 70–99)

## 2012-10-05 NOTE — Telephone Encounter (Signed)
Per dr Angela Nevin- [facility],iron first

## 2012-10-05 NOTE — Progress Notes (Signed)
CBG 91 after warm up. Patient given crackers peanut butter and juice.  Repeat CBG 98. K[place] completed exercise without difficulty.Will continue to monitor the patient throughout  the program.

## 2012-10-06 ENCOUNTER — Encounter: Payer: Self-pay | Admitting: Internal Medicine

## 2012-10-06 ENCOUNTER — Other Ambulatory Visit (INDEPENDENT_AMBULATORY_CARE_PROVIDER_SITE_OTHER): Payer: Medicare Other

## 2012-10-06 DIAGNOSIS — D649 Anemia, unspecified: Secondary | ICD-10-CM

## 2012-10-06 LAB — CBC WITH DIFFERENTIAL/PLATELET
Basophils Absolute: 0 10*3/uL (ref 0.0–0.1)
HCT: 31 % — ABNORMAL LOW (ref 36.0–46.0)
Lymphs Abs: 1.2 10*3/uL (ref 0.7–4.0)
Monocytes Absolute: 0.4 10*3/uL (ref 0.1–1.0)
Monocytes Relative: 6.8 % (ref 3.0–12.0)
Neutrophils Relative %: 67 % (ref 43.0–77.0)
Platelets: 177 10*3/uL (ref 150.0–400.0)
RDW: 14.1 % (ref 11.5–14.6)
WBC: 5.5 10*3/uL (ref 4.5–10.5)

## 2012-10-06 LAB — FERRITIN: Ferritin: 36 ng/mL (ref 10.0–291.0)

## 2012-10-07 ENCOUNTER — Other Ambulatory Visit: Payer: Medicare Other

## 2012-10-07 ENCOUNTER — Telehealth: Payer: Self-pay | Admitting: Internal Medicine

## 2012-10-07 ENCOUNTER — Encounter (HOSPITAL_COMMUNITY)
Admission: RE | Admit: 2012-10-07 | Discharge: 2012-10-07 | Disposition: A | Discharge status: Home / Self Care | Payer: Medicare Other | Source: Ambulatory Visit | Attending: Cardiovascular Disease | Admitting: Cardiovascular Disease

## 2012-10-07 ENCOUNTER — Other Ambulatory Visit: Payer: Self-pay | Admitting: Cardiovascular Disease

## 2012-10-07 ENCOUNTER — Other Ambulatory Visit: Payer: Self-pay | Admitting: *Deleted

## 2012-10-07 LAB — GLUCOSE, CAPILLARY: Glucose-Capillary: 123 mg/dL — ABNORMAL HIGH (ref 70–99)

## 2012-10-07 NOTE — Telephone Encounter (Signed)
Fax Received. Refill Completed. Mael Delap Chowoe (R.M.A)   

## 2012-10-07 NOTE — Telephone Encounter (Signed)
Per dr Arnoldo Morale- continue with iron for a while-hgb is still a little low

## 2012-10-07 NOTE — Telephone Encounter (Signed)
I don't see where I placed an order but the diagnosis is anemia, 281.0

## 2012-10-07 NOTE — Telephone Encounter (Signed)
Please advise 

## 2012-10-07 NOTE — Telephone Encounter (Signed)
Phlebotomist called and stated that the "iron order" that Padonda placed, didn't have a diagnosis code. Please assist.

## 2012-10-07 NOTE — Telephone Encounter (Signed)
Ferritin was done 10/06/12

## 2012-10-08 DIAGNOSIS — T8209XA Other mechanical complication of heart valve prosthesis, initial encounter: Secondary | ICD-10-CM | POA: Diagnosis not present

## 2012-10-08 DIAGNOSIS — I359 Nonrheumatic aortic valve disorder, unspecified: Secondary | ICD-10-CM | POA: Diagnosis not present

## 2012-10-08 DIAGNOSIS — Z006 Encounter for examination for normal comparison and control in clinical research program: Secondary | ICD-10-CM | POA: Diagnosis not present

## 2012-10-09 ENCOUNTER — Encounter (HOSPITAL_COMMUNITY)
Admission: RE | Admit: 2012-10-09 | Discharge: 2012-10-09 | Disposition: A | Discharge status: Home / Self Care | Payer: Medicare Other | Source: Ambulatory Visit | Attending: Cardiovascular Disease | Admitting: Cardiovascular Disease

## 2012-10-09 LAB — GLUCOSE, CAPILLARY: Glucose-Capillary: 115 mg/dL — ABNORMAL HIGH (ref 70–99)

## 2012-10-09 NOTE — Progress Notes (Signed)
Sara Garza graduates on Monday and plans to continue exercise on her own.

## 2012-10-12 ENCOUNTER — Encounter (HOSPITAL_COMMUNITY): Payer: Medicare Other

## 2012-10-12 ENCOUNTER — Encounter (HOSPITAL_COMMUNITY)
Admission: RE | Admit: 2012-10-12 | Discharge: 2012-10-12 | Disposition: A | Discharge status: Home / Self Care | Payer: Medicare Other | Source: Ambulatory Visit | Attending: Cardiovascular Disease | Admitting: Cardiovascular Disease

## 2012-10-14 ENCOUNTER — Encounter (HOSPITAL_COMMUNITY): Payer: Medicare Other

## 2012-10-15 ENCOUNTER — Other Ambulatory Visit: Payer: Medicare Other

## 2012-10-15 NOTE — Addendum Note (Signed)
Addended by: Elmer Picker on: 10/15/2012 02:29 PM   Modules accepted: Orders

## 2012-10-16 ENCOUNTER — Encounter (HOSPITAL_COMMUNITY): Payer: Medicare Other

## 2012-10-20 ENCOUNTER — Other Ambulatory Visit: Payer: Self-pay | Admitting: Internal Medicine

## 2012-10-23 ENCOUNTER — Ambulatory Visit: Payer: Medicare Other | Admitting: Cardiovascular Disease

## 2012-10-23 ENCOUNTER — Ambulatory Visit (INDEPENDENT_AMBULATORY_CARE_PROVIDER_SITE_OTHER): Payer: Medicare Other | Admitting: Internal Medicine

## 2012-10-23 ENCOUNTER — Encounter: Payer: Self-pay | Admitting: Internal Medicine

## 2012-10-23 VITALS — BP 140/80 | HR 76 | Temp 98.2°F | Resp 18 | Ht 60.0 in | Wt 160.0 lb

## 2012-10-23 DIAGNOSIS — E119 Type 2 diabetes mellitus without complications: Secondary | ICD-10-CM | POA: Diagnosis not present

## 2012-10-23 LAB — LDL CHOLESTEROL, DIRECT: Direct LDL: 158.6 mg/dL

## 2012-10-23 NOTE — Patient Instructions (Addendum)
The patient is instructed to continue all medications as prescribed. Schedule followup with check out clerk upon leaving the clinic  

## 2012-10-23 NOTE — Progress Notes (Signed)
  Subjective:    Patient ID: Sara Garza, female    DOB: 12/03/1923, 79 y.o.   MRN: [id number]  HPI Status post trans-arterial aortic valve replacement doing well.  Followed for hypertension stable.  Followed for elevated blood sugar monitor hemoglobin A1c  Iron levels and hemoglobin are stable   Review of Systems  Constitutional: Negative for activity change, appetite change and fatigue.  HENT: Negative for ear pain, congestion, neck pain, postnasal drip and sinus pressure.   Eyes: Negative for redness and visual disturbance.  Respiratory: Negative for cough, shortness of breath and wheezing.   Gastrointestinal: Negative for abdominal pain and abdominal distention.  Genitourinary: Negative for dysuria, frequency and menstrual problem.  Musculoskeletal: Negative for myalgias, joint swelling and arthralgias.  Skin: Negative for rash and wound.  Neurological: Negative for dizziness, weakness and headaches.  Hematological: Negative for adenopathy. Does not bruise/bleed easily.  Psychiatric/Behavioral: Negative for sleep disturbance and decreased concentration.       Objective:   Physical Exam  Nursing note and vitals reviewed. Constitutional: She is oriented to person, place, and time. She appears well-developed and well-nourished. No distress.  HENT:  Head: Normocephalic and atraumatic.  Eyes: Conjunctivae and EOM are normal. Pupils are equal, round, and reactive to light.  Neck: Normal range of motion. Neck supple. No JVD present. No tracheal deviation present. No thyromegaly present.  Cardiovascular: Normal rate, regular rhythm and intact distal pulses.   Murmur heard. Stable mechanical heart sounds  Pulmonary/Chest: Effort normal and breath sounds normal. She has no wheezes. She exhibits no tenderness.  Abdominal: Soft. Bowel sounds are normal.  Musculoskeletal: She exhibits no edema and no tenderness.  Lymphadenopathy:    She has no cervical adenopathy.  Neurological:  She is alert and oriented to person, place, and time. She has normal reflexes. No cranial nerve deficit.  Skin: Skin is warm and dry. She is not diaphoretic.          Assessment & Plan:  Monitoring hemoglobin A1c for diabetic control.  Stable aortic valve replacement.  Stable hypertension.   Monitor LDL Stable weight.  No evidence for any heart failure

## 2012-10-26 ENCOUNTER — Ambulatory Visit: Payer: Medicare Other | Admitting: Cardiovascular Disease

## 2012-10-27 ENCOUNTER — Ambulatory Visit: Payer: Medicare Other | Admitting: Cardiovascular Disease

## 2012-11-06 ENCOUNTER — Ambulatory Visit (INDEPENDENT_AMBULATORY_CARE_PROVIDER_SITE_OTHER): Payer: Medicare Other | Admitting: Cardiovascular Disease

## 2012-11-06 ENCOUNTER — Encounter: Payer: Self-pay | Admitting: Cardiovascular Disease

## 2012-11-06 VITALS — BP 144/59 | HR 79 | Ht 60.0 in | Wt 161.0 lb

## 2012-11-06 DIAGNOSIS — I359 Nonrheumatic aortic valve disorder, unspecified: Secondary | ICD-10-CM

## 2012-11-06 NOTE — Progress Notes (Signed)
   HPI:  79 year old woman presenting for followup evaluation. She underwent [name] January 29 at [facility] for treatment of severe symptomatic aortic stenosis. She was treated with a 23 mm Medtronic core valve. She's done remarkably well since her procedure. She's able to do all the things she enjoys without symptoms of shortness of breath, chest discomfort, or lightheadedness. She does experience occasional dyspnea but this seems to be unpredictable. She recently walked a half mile with a few breaks. She is now off Plavix.  Outpatient Encounter Prescriptions as of 11/06/2012  Medication Sig Dispense Refill  . amoxicillin (AMOXIL) 500 MG tablet Take 4 tablets one hour prior to dental procedures  8 tablet  2  . aspirin 81 MG tablet Take 81 mg by mouth daily.        . bimatoprost (LUMIGAN) 0.01 % SOLN 1 drop at bedtime.      . Calcium Carb-Cholecalciferol (CALCIUM 500 +D) 500-400 MG-UNIT TABS Take by mouth daily.       [name] EXFORGE HCT 5-160-12.5 MG TABS TAKE 1 TABLET EVERY DAY  30 tablet  5  . Krill Oil 500 MG CAPS Take 500 capsules by mouth 3 (three) times daily.      . Multiple Vitamin (MULTIVITAMIN) tablet Take 1 tablet by mouth daily.        . [DISCONTINUED] clopidogrel (PLAVIX) 75 MG tablet TAKE 1 TABLET ORAL DAILY  90 tablet  1   No facility-administered encounter medications on file as of 11/06/2012.    Allergies  Allergen Reactions  . Statins Other (See Comments)    Muscle aches    Past Medical History  Diagnosis Date  . Hyperlipidemia   . Hypertension   . Osteoporosis   . PMR (polymyalgia rheumatica)   . Diabetes mellitus   . Arthritis   . Carotid stenosis, left     50-60% stable    ROS: Negative except as per HPI  BP 144/59  Pulse 79  Ht 5' (1.524 m)  Wt 73.029 kg (161 lb)  BMI 31.44 kg/m2  SpO2 97%  PHYSICAL EXAM: Pt is alert and oriented, pleasant elderly woman in NAD HEENT: normal Neck: JVP - normal, carotids 2+= without bruits Lungs: CTA bilaterally CV: RRR with grade  2/6 systolic ejection murmur at the right upper sternal border Abd: soft, NT, Positive BS, no hepatomegaly Ext: no C/C/E, distal pulses intact and equal Skin: warm/dry no rash  ASSESSMENT AND PLAN: 1. Severe aortic stenosis. She is status post transcatheter aortic valve replacement and is doing well, currently with New York Heart Association class I symptoms. She is being followed at [facility] as part of the SURTAVI protocol. She will have a followup echocardiogram there which she returns for her 12 month post procedure followup. I will see her back in 6 months.  2. Coronary artery disease, native vessel. She is stable without anginal symptoms. Continue same therapy. She's on antiplatelet therapy with aspirin.  3. Hyperlipidemia. Her LDL is elevated. However, she is intolerant to all statin agents. Considering her advanced age I am inclined to continue observation.  For followup I will see her back in 6 months.  Sherren Mocha 11/06/2012 10:36 AM

## 2012-11-06 NOTE — Patient Instructions (Addendum)
Your physician wants you to follow-up in: [place] will receive a reminder letter in the mail two months in advance. If you don't receive a letter, please call our office to schedule the follow-up appointment.

## 2012-12-04 ENCOUNTER — Ambulatory Visit (INDEPENDENT_AMBULATORY_CARE_PROVIDER_SITE_OTHER): Payer: Medicare Other | Admitting: Family Medicine

## 2012-12-04 ENCOUNTER — Encounter: Payer: Self-pay | Admitting: Family Medicine

## 2012-12-04 VITALS — BP 140/72 | HR 95 | Wt 160.0 lb

## 2012-12-04 DIAGNOSIS — J209 Acute bronchitis, unspecified: Secondary | ICD-10-CM

## 2012-12-04 MED ORDER — HYDROCODONE-HOMATROPINE 5-1.5 MG/5ML PO SYRP: 5.0000 mL | ORAL_SOLUTION | Freq: Four times a day (QID) | ORAL | Status: AC | PRN

## 2012-12-04 NOTE — Progress Notes (Signed)
  Subjective:    Patient ID: Sara Garza, female    DOB: Oct 17, 1923, 79 y.o.   MRN: [id number]  HPI Acute visit. Patient here with 2 day history of cough mostly dry occasionally productive of yellow sputum. No fever. Nonsmoker. No chronic respiratory difficulties. Associated symptoms include nasal congestion and mild sore throat. Mild body aches. Denies any nausea, vomiting, or diarrhea. Sleep is disrupted secondary cough. She took over-the-counter Delsym cough syrup without improvement.  Chronic problems include history of severe aortic stenosis and last January she had aortic valve replacement. She has done well since that time. She is currently not taking any anticoagulants. She has history of CAD and has had intolerance to multiple statins.  Past Medical History  Diagnosis Date  . Hyperlipidemia   . Hypertension   . Osteoporosis   . PMR (polymyalgia rheumatica)   . Diabetes mellitus   . Arthritis   . Carotid stenosis, left     50-60% stable   Past Surgical History  Procedure Laterality Date  . Cesarean section    . Appendectomy    . Foot surgery      [name]    reports that she has never smoked. She has never used smokeless tobacco. She reports that  drinks alcohol. She reports that she does not use illicit drugs. family history includes COPD in her father; Cancer in her father; Lung cancer in an other family member. Allergies  Allergen Reactions  . Statins Other (See Comments)    Muscle aches      Review of Systems  Constitutional: Positive for fatigue. Negative for fever and chills.  HENT: Positive for congestion and sore throat.   Respiratory: Positive for cough. Negative for wheezing.   Neurological: Negative for headaches.       Objective:   Physical Exam  Constitutional: She appears well-developed and well-nourished.  HENT:  Mouth/Throat: Oropharynx is clear and moist.  Left ear drum is normal. Right canal moderate cerumen  Neck: Neck supple.   Cardiovascular: Normal rate.   Pulmonary/Chest: Effort normal and breath sounds normal. No respiratory distress. She has no wheezes. She has no rales.  Musculoskeletal: She exhibits no edema.          Assessment & Plan:  Acute bronchitis. Suspect viral. No clinical indicators for pneumonia. Hycodan cough syrup for nighttime use as needed. Followup promptly for fever or worsening symptoms

## 2012-12-04 NOTE — Patient Instructions (Addendum)

## 2012-12-25 ENCOUNTER — Ambulatory Visit (INDEPENDENT_AMBULATORY_CARE_PROVIDER_SITE_OTHER): Payer: Medicare Other

## 2012-12-25 DIAGNOSIS — Z23 Encounter for immunization: Secondary | ICD-10-CM

## 2013-02-05 DIAGNOSIS — Z1231 Encounter for screening mammogram for malignant neoplasm of breast: Secondary | ICD-10-CM | POA: Diagnosis not present

## 2013-02-22 ENCOUNTER — Encounter: Payer: Self-pay | Admitting: Internal Medicine

## 2013-02-22 ENCOUNTER — Telehealth: Payer: Self-pay | Admitting: *Deleted

## 2013-02-22 ENCOUNTER — Ambulatory Visit (INDEPENDENT_AMBULATORY_CARE_PROVIDER_SITE_OTHER): Payer: Medicare Other | Admitting: Internal Medicine

## 2013-02-22 VITALS — BP 120/60 | HR 84 | Temp 98.2°F | Resp 16 | Ht 60.0 in | Wt 161.0 lb

## 2013-02-22 DIAGNOSIS — IMO0001 Reserved for inherently not codable concepts without codable children: Secondary | ICD-10-CM

## 2013-02-22 DIAGNOSIS — T887XXA Unspecified adverse effect of drug or medicament, initial encounter: Secondary | ICD-10-CM

## 2013-02-22 DIAGNOSIS — E785 Hyperlipidemia, unspecified: Secondary | ICD-10-CM | POA: Diagnosis not present

## 2013-02-22 DIAGNOSIS — M810 Age-related osteoporosis without current pathological fracture: Secondary | ICD-10-CM | POA: Diagnosis not present

## 2013-02-22 DIAGNOSIS — Z23 Encounter for immunization: Secondary | ICD-10-CM | POA: Diagnosis not present

## 2013-02-22 LAB — CBC WITH DIFFERENTIAL/PLATELET
Basophils Absolute: 0 10*3/uL (ref 0.0–0.1)
Basophils Relative: 0.6 % (ref 0.0–3.0)
Hemoglobin: 11 g/dL — ABNORMAL LOW (ref 12.0–15.0)
Lymphocytes Relative: 25.4 % (ref 12.0–46.0)
Monocytes Relative: 8.4 % (ref 3.0–12.0)
Neutro Abs: 3.8 10*3/uL (ref 1.4–7.7)
RBC: 3.63 Mil/uL — ABNORMAL LOW (ref 3.87–5.11)
RDW: 13.6 % (ref 11.5–14.6)

## 2013-02-22 LAB — HEMOGLOBIN A1C: Hgb A1c MFr Bld: 6 % (ref 4.6–6.5)

## 2013-02-22 LAB — BASIC METABOLIC PANEL
Chloride: 109 mEq/L (ref 96–112)
Potassium: 4.5 mEq/L (ref 3.5–5.1)

## 2013-02-22 LAB — TSH: TSH: 1.12 u[IU]/mL (ref 0.35–5.50)

## 2013-02-22 NOTE — Patient Instructions (Signed)
Call [facility] in late December to split the  exforge into the three drugs

## 2013-02-22 NOTE — Progress Notes (Signed)
Subjective:    Patient ID: Sara Garza, female    DOB: [birth year], 79 y.o.   MRN: [id number]  Hyperlipidemia Associated symptoms include shortness of breath. Pertinent negatives include no myalgias.   Patient is an 79 year old female is followed for hypertension osteoporosis a history of  AV replacement And mild diabetes who presents for routine followup and appropriate laboratory screening.  She is due hemoglobin [id number] a basic metabolic panel a TSH a vitamin D level.  She is also due a screening bone density test    Review of Systems  Constitutional: Negative for activity change, appetite change and fatigue.  HENT: Negative for congestion, ear pain, postnasal drip and sinus pressure.   Eyes: Negative for redness and visual disturbance.  Respiratory: Positive for shortness of breath. Negative for cough and wheezing.   Gastrointestinal: Negative for abdominal pain and abdominal distention.  Genitourinary: Negative for dysuria, frequency and menstrual problem.  Musculoskeletal: Negative for arthralgias, joint swelling, myalgias and neck pain.  Skin: Negative for rash and wound.  Neurological: Negative for dizziness, weakness and headaches.  Hematological: Negative for adenopathy. Does not bruise/bleed easily.  Psychiatric/Behavioral: Negative for sleep disturbance and decreased concentration.   Past Medical History  Diagnosis Date  . Hyperlipidemia   . Hypertension   . Osteoporosis   . PMR (polymyalgia rheumatica)   . Diabetes mellitus   . Arthritis   . Carotid stenosis, left     50-60% stable    History   Social History  . Marital Status: Widowed    Spouse Name: N/A    Number of Children: N/A  . Years of Education: N/A   Occupational History  . Not on file.   Social History Main Topics  . Smoking status: Never Smoker   . Smokeless tobacco: Never Used  . Alcohol Use: Yes     Comment: very seldom  . Drug Use: No  . Sexual Activity: Not Currently   Other Topics  Concern  . Not on file   Social History Narrative  . No narrative on file    Past Surgical History  Procedure Laterality Date  . Cesarean section    . Appendectomy    . Foot surgery      [name]    Family History  Problem Relation Age of Onset  . Lung cancer    . COPD Father   . Cancer Father     lung cancer    Allergies  Allergen Reactions  . Statins Other (See Comments)    Muscle aches    Current Outpatient Prescriptions on File Prior to Visit  Medication Sig Dispense Refill  . amoxicillin (AMOXIL) 500 MG tablet Take 4 tablets one hour prior to dental procedures  8 tablet  2  . aspirin 81 MG tablet Take 81 mg by mouth daily.        . bimatoprost (LUMIGAN) 0.01 % SOLN 1 drop at bedtime.      . Calcium Carb-Cholecalciferol (CALCIUM 500 +D) 500-400 MG-UNIT TABS Take by mouth daily.       [name] EXFORGE HCT 5-160-12.5 MG TABS TAKE 1 TABLET EVERY DAY  30 tablet  5  . Krill Oil 500 MG CAPS Take 500 capsules by mouth 3 (three) times daily.      . Multiple Vitamin (MULTIVITAMIN) tablet Take 1 tablet by mouth daily.         No current facility-administered medications on file prior to visit.    BP 120/60  Pulse 84  Temp(Src) 98.2  F (36.8 C)  Resp 16  Ht 5' (1.524 m)  Wt 161 lb (73.029 kg)  BMI 31.44 kg/m2        Objective:   Physical Exam  Nursing note and vitals reviewed. Constitutional: She is oriented to person, place, and time. She appears well-developed and well-nourished.  HENT:  Head: Normocephalic and atraumatic.  Eyes: Conjunctivae are normal. Pupils are equal, round, and reactive to light.  Cardiovascular: Normal rate.   Murmur heard. trigeminy and mechanical click  Neurological: She is alert and oriented to person, place, and time.  Skin: Skin is warm and dry.          Assessment & Plan:    postviral placement stable.  Osteoporosis screening vitamin D level and bone density.  Discussion of change of hypertension hypertensive medication  into 3 parts due to formulary change at the beginning of the year.  Appropriate laboratory screening  today protocols.

## 2013-02-22 NOTE — Addendum Note (Signed)
Addended by: Allyne Gee on: 02/22/2013 05:44 PM   Modules accepted: Orders

## 2013-02-22 NOTE — Progress Notes (Signed)
Pre visit review using our clinic review tool, if applicable. No additional management support is needed unless otherwise documented below in the visit note. 

## 2013-02-22 NOTE — Telephone Encounter (Signed)
Please call patient  And schedule a bone density

## 2013-02-23 ENCOUNTER — Encounter: Payer: Self-pay | Admitting: Internal Medicine

## 2013-02-23 NOTE — Telephone Encounter (Signed)
done

## 2013-02-24 ENCOUNTER — Telehealth: Payer: Self-pay | Admitting: Internal Medicine

## 2013-02-24 NOTE — Telephone Encounter (Signed)
Pt informed and copy of labs put up front for pt to pick up

## 2013-02-24 NOTE — Telephone Encounter (Signed)
Pt needs blood work results °

## 2013-03-17 ENCOUNTER — Ambulatory Visit (INDEPENDENT_AMBULATORY_CARE_PROVIDER_SITE_OTHER)
Admission: RE | Admit: 2013-03-17 | Discharge: 2013-03-17 | Disposition: A | Discharge status: Home / Self Care | Payer: Medicare Other | Source: Ambulatory Visit | Attending: Internal Medicine | Admitting: Internal Medicine

## 2013-03-17 DIAGNOSIS — M810 Age-related osteoporosis without current pathological fracture: Secondary | ICD-10-CM

## 2013-04-08 ENCOUNTER — Other Ambulatory Visit: Payer: Medicare Other | Admitting: *Deleted

## 2013-04-08 DIAGNOSIS — Z23 Encounter for immunization: Secondary | ICD-10-CM

## 2013-04-08 NOTE — Addendum Note (Signed)
Addended by: Allyne Gee on: 04/08/2013 08:49 AM   Modules accepted: Orders

## 2013-04-13 DIAGNOSIS — I6529 Occlusion and stenosis of unspecified carotid artery: Secondary | ICD-10-CM | POA: Diagnosis not present

## 2013-04-14 DIAGNOSIS — I359 Nonrheumatic aortic valve disorder, unspecified: Secondary | ICD-10-CM | POA: Diagnosis not present

## 2013-04-16 ENCOUNTER — Other Ambulatory Visit: Payer: Self-pay | Admitting: Internal Medicine

## 2013-04-17 ENCOUNTER — Other Ambulatory Visit: Payer: Self-pay | Admitting: Internal Medicine

## 2013-04-21 ENCOUNTER — Telehealth: Payer: Self-pay | Admitting: Internal Medicine

## 2013-04-21 NOTE — Telephone Encounter (Signed)
Pt would like to change EXFORGE HCT 5-160-12.5 MG TABS to something cheaper.

## 2013-04-22 ENCOUNTER — Other Ambulatory Visit: Payer: Self-pay | Admitting: *Deleted

## 2013-04-22 MED ORDER — VALSARTAN-HYDROCHLOROTHIAZIDE 160-12.5 MG PO TABS: 1.0000 | ORAL_TABLET | Freq: Every day | ORAL | Status: DC

## 2013-04-22 MED ORDER — AMLODIPINE BESYLATE 5 MG PO TABS: 5.0000 mg | ORAL_TABLET | Freq: Every day | ORAL | Status: DC

## 2013-04-22 NOTE — Telephone Encounter (Signed)
Left message on machine Find out what preferred bp meds her i nsurance will pay for

## 2013-04-22 NOTE — Telephone Encounter (Signed)
Changed to amlodipine 5 and diovan 160/12.5-Left message on machine for pt and sent to ph [facility]

## 2013-05-26 ENCOUNTER — Ambulatory Visit (INDEPENDENT_AMBULATORY_CARE_PROVIDER_SITE_OTHER): Payer: Medicare Other | Admitting: Internal Medicine

## 2013-05-26 ENCOUNTER — Encounter: Payer: Self-pay | Admitting: Internal Medicine

## 2013-05-26 VITALS — BP 142/70 | HR 98 | Temp 97.6°F | Resp 20 | Ht 60.0 in | Wt 163.0 lb

## 2013-05-26 DIAGNOSIS — I10 Essential (primary) hypertension: Secondary | ICD-10-CM

## 2013-05-26 DIAGNOSIS — H9209 Otalgia, unspecified ear: Secondary | ICD-10-CM | POA: Diagnosis not present

## 2013-05-26 DIAGNOSIS — H9202 Otalgia, left ear: Secondary | ICD-10-CM

## 2013-05-26 MED ORDER — DICLOFENAC SODIUM 50 MG PO TBEC: 50.0000 mg | DELAYED_RELEASE_TABLET | Freq: Two times a day (BID) | ORAL | Status: DC

## 2013-05-26 NOTE — Progress Notes (Signed)
Pre-visit discussion using our clinic review tool. No additional management support is needed unless otherwise documented below in the visit note.  

## 2013-05-26 NOTE — Patient Instructions (Signed)
Take pain/anti-inflammatory medications twice a day as directed  Call or return to clinic prn if these symptoms worsen or fail to improve as anticipated.

## 2013-05-26 NOTE — Progress Notes (Signed)
Subjective:    Patient ID: Sara Garza, female    DOB: 12/07/1923, 80 y.o.   MRN: [place]:[id number]  HPI  80 year old patient who presents with a four-day history of episodic left ear pain.  She describes paroxysms of very fleeting, sharp, intense left ear pain that also involves the left lateral neck area.  The episodes are very brief and seem to be paroxysmal and not precipitated by any activities.  She has a history of treated hypertension and also prior history of diabetes, now diet controlled.  Her last hemoglobin A1c 6.0.  No similar symptoms in the past.  Past Medical History  Diagnosis Date  . Hyperlipidemia   . Hypertension   . Osteoporosis   . PMR (polymyalgia rheumatica)   . Diabetes mellitus   . Arthritis   . Carotid stenosis, left     50-60% stable    History   Social History  . Marital Status: Widowed    Spouse Name: N/A    Number of Children: N/A  . Years of Education: N/A   Occupational History  . Not on file.   Social History Main Topics  . Smoking status: Never Smoker   . Smokeless tobacco: Never Used  . Alcohol Use: Yes     Comment: very seldom  . Drug Use: No  . Sexual Activity: Not Currently   Other Topics Concern  . Not on file   Social History Narrative  . No narrative on file    Past Surgical History  Procedure Laterality Date  . Cesarean section    . Appendectomy    . Foot surgery      [name]  . Cardiac valve replacement  04/29/2012    Family History  Problem Relation Age of Onset  . Lung cancer    . COPD Father   . Cancer Father     lung cancer    Allergies  Allergen Reactions  . Statins Other (See Comments)    Muscle aches    Current Outpatient Prescriptions on File Prior to Visit  Medication Sig Dispense Refill  . aspirin 81 MG tablet Take 81 mg by mouth daily.        . bimatoprost (LUMIGAN) 0.01 % SOLN 1 drop at bedtime.      . Calcium Carb-Cholecalciferol (CALCIUM 500 +D) 500-400 MG-UNIT TABS Take by mouth daily.        Javier Docker Oil 500 MG CAPS Take 500 capsules by mouth 3 (three) times daily.      . Multiple Vitamin (MULTIVITAMIN) tablet Take 1 tablet by mouth daily.        [name] amoxicillin (AMOXIL) 500 MG tablet Take 4 tablets one hour prior to dental procedures  8 tablet  2   No current facility-administered medications on file prior to visit.    BP 142/70  Pulse 98  Temp(Src) 97.6 F (36.4 C) (Oral)  Resp 20  Ht 5' (1.524 m)  Wt 163 lb (73.936 kg)  BMI 31.83 kg/m2  SpO2 97%     Review of Systems  Constitutional: Negative.   HENT: Positive for ear pain. Negative for congestion, dental problem, hearing loss, rhinorrhea, sinus pressure, sore throat and tinnitus.   Eyes: Negative for pain, discharge and visual disturbance.  Respiratory: Negative for cough and shortness of breath.   Cardiovascular: Negative for chest pain, palpitations and leg swelling.  Gastrointestinal: Negative for nausea, vomiting, abdominal pain, diarrhea, constipation, blood in stool and abdominal distention.  Genitourinary: Negative for dysuria, urgency,  frequency, hematuria, flank pain, vaginal bleeding, vaginal discharge, difficulty urinating, vaginal pain and pelvic pain.  Musculoskeletal: Negative for arthralgias, gait problem and joint swelling.  Skin: Negative for rash.  Neurological: Negative for dizziness, syncope, speech difficulty, weakness, numbness and headaches.  Hematological: Negative for adenopathy.  Psychiatric/Behavioral: Negative for behavioral problems, dysphoric mood and agitation. The patient is not nervous/anxious.        Objective:   Physical Exam  Constitutional: She appears well-developed and well-nourished. No distress.  Healed perforation left tympanic membrane, but no acute vomiting  HENT:  Head: Normocephalic.  Right Ear: External ear normal.  Left Ear: External ear normal.  Nose: Nose normal.  Mouth/Throat: Oropharyngeal exudate present.  Neck: Normal range of motion. Neck supple.    Lymphadenopathy:    She has no cervical adenopathy.          Assessment & Plan:    sporadic left hemifacial pain.  Unclear etiology- probable neuralgia.  Will treat with an anti-inflammatory for 10 days and observe at this point  Hypertension stable

## 2013-05-28 ENCOUNTER — Telehealth: Payer: Self-pay | Admitting: Internal Medicine

## 2013-05-28 NOTE — Telephone Encounter (Signed)
Relevant patient education mailed to patient.  

## 2013-06-02 ENCOUNTER — Ambulatory Visit: Payer: Medicare Other | Admitting: Cardiovascular Disease

## 2013-06-03 ENCOUNTER — Ambulatory Visit: Payer: Medicare Other | Admitting: Cardiovascular Disease

## 2013-06-09 ENCOUNTER — Ambulatory Visit (INDEPENDENT_AMBULATORY_CARE_PROVIDER_SITE_OTHER): Payer: Medicare Other | Admitting: Cardiovascular Disease

## 2013-06-09 ENCOUNTER — Encounter: Payer: Self-pay | Admitting: Cardiovascular Disease

## 2013-06-09 VITALS — BP 146/60 | HR 71 | Ht 60.0 in | Wt 163.0 lb

## 2013-06-09 DIAGNOSIS — E785 Hyperlipidemia, unspecified: Secondary | ICD-10-CM

## 2013-06-09 DIAGNOSIS — I359 Nonrheumatic aortic valve disorder, unspecified: Secondary | ICD-10-CM

## 2013-06-09 DIAGNOSIS — I10 Essential (primary) hypertension: Secondary | ICD-10-CM

## 2013-06-09 DIAGNOSIS — I350 Nonrheumatic aortic (valve) stenosis: Secondary | ICD-10-CM

## 2013-06-09 MED ORDER — EZETIMIBE 10 MG PO TABS: 10.0000 mg | ORAL_TABLET | Freq: Every day | ORAL | Status: DC

## 2013-06-09 NOTE — Patient Instructions (Addendum)
Your physician has recommended you make the following change in your medication:  START Zetia 10 mg daily at 6 pm  Your physician recommends that you return for lab work in: 3 months (June 11)  for cholesterol and liver screening - you will need to fast for this appointment  Your physician wants you to follow-up in: 6 months with Dr. Burt Knack.  You will receive a reminder letter in the mail two months in advance. If you don't receive a letter, please call our office to schedule the follow-up appointment.

## 2013-06-10 ENCOUNTER — Encounter: Payer: Self-pay | Admitting: Cardiovascular Disease

## 2013-06-10 DIAGNOSIS — H6120 Impacted cerumen, unspecified ear: Secondary | ICD-10-CM | POA: Diagnosis not present

## 2013-06-10 DIAGNOSIS — H903 Sensorineural hearing loss, bilateral: Secondary | ICD-10-CM | POA: Diagnosis not present

## 2013-06-10 NOTE — Progress Notes (Signed)
    HPI:   80 year old woman presenting for followup evaluation. She was last seen in August 2014. The patient underwent transcatheter aortic valve replacement one year ago at [facility] for treatment of severe symptomatic aortic stenosis. She was treated with a 23 mm Medtronic Core Valve.  The patient is doing fairly well. She does have some shortness of breath with exertion, but much improved since valve replacement. She denies chest pain, orthopnea, PND, lightheadedness, or syncope. She's had no palpitations. She has chronic mild leg swelling. She is able to walk for about 5 minutes without resting.  Outpatient Encounter Prescriptions as of 06/09/2013  Medication Sig  . amoxicillin (AMOXIL) 500 MG tablet Take 4 tablets one hour prior to dental procedures  . aspirin 81 MG tablet Take 81 mg by mouth daily.    . bimatoprost (LUMIGAN) 0.01 % SOLN 1 drop at bedtime.  . Calcium Carb-Cholecalciferol (CALCIUM 500 +D) 500-400 MG-UNIT TABS Take by mouth daily.   [name] EXFORGE HCT 5-160-12.5 MG TABS   . Krill Oil 500 MG CAPS Take 500 capsules by mouth 3 (three) times daily.  . Multiple Vitamin (MULTIVITAMIN) tablet Take 1 tablet by mouth daily.    [name] ezetimibe (ZETIA) 10 MG tablet Take 1 tablet (10 mg total) by mouth daily.  . [DISCONTINUED] diclofenac (VOLTAREN) 50 MG EC tablet Take 1 tablet (50 mg total) by mouth 2 (two) times daily.    Allergies  Allergen Reactions  . Statins Other (See Comments)    Muscle aches    Past Medical History  Diagnosis Date  . Hyperlipidemia   . Hypertension   . Osteoporosis   . PMR (polymyalgia rheumatica)   . Diabetes mellitus   . Arthritis   . Carotid stenosis, left     50-60% stable    ROS: Negative except as per HPI  BP 146/60  Pulse 71  Ht 5' (1.524 m)  Wt 163 lb (73.936 kg)  BMI 31.83 kg/m2  PHYSICAL EXAM: Pt is alert and oriented, pleasant elderly woman in NAD HEENT: normal Neck: JVP - normal, carotids 2+= without bruits Lungs:  CTA bilaterally CV: RRR with a soft systolic ejection murmur at the LSB Abd: soft, NT, Positive BS, no hepatomegaly Ext: Trace pretibial edema bilaterally, distal pulses intact and equal Skin: warm/dry no rash  EKG:  Normal sinus rhythm with left bundle branch block, heart rate 74 beats per minute.  ASSESSMENT AND PLAN: 1. Aortic stenosis, now one year s/p [name] (23 Medtronic Core Valve). The patient has New York Heart Association class II symptoms with exertional dyspnea. Overall I think she is stable and will continue her current medical regimen at this point. She has had a one-year followup echocardiogram at [facility] as part of the SURTAVI protocol.  I will see her back in 6 months for follow-up.   2. HTN - controlled on Exforge.  3. Hyperlipidemia. Last LDL 156. She's statin-intolerant. Recommend Zetia 10 mg daily.  4. CAD, native vessel. Stable without sx's of angina.   Sherren Mocha 06/10/2013 9:00 AM

## 2013-06-11 DIAGNOSIS — H3530 Unspecified macular degeneration: Secondary | ICD-10-CM | POA: Diagnosis not present

## 2013-06-11 DIAGNOSIS — H2640 Unspecified secondary cataract: Secondary | ICD-10-CM | POA: Diagnosis not present

## 2013-06-11 DIAGNOSIS — Z961 Presence of intraocular lens: Secondary | ICD-10-CM | POA: Diagnosis not present

## 2013-06-11 DIAGNOSIS — E119 Type 2 diabetes mellitus without complications: Secondary | ICD-10-CM | POA: Diagnosis not present

## 2013-07-01 DIAGNOSIS — H26499 Other secondary cataract, unspecified eye: Secondary | ICD-10-CM | POA: Diagnosis not present

## 2013-07-01 DIAGNOSIS — H2640 Unspecified secondary cataract: Secondary | ICD-10-CM | POA: Diagnosis not present

## 2013-07-16 ENCOUNTER — Ambulatory Visit (INDEPENDENT_AMBULATORY_CARE_PROVIDER_SITE_OTHER): Payer: Medicare Other | Admitting: Internal Medicine

## 2013-07-16 ENCOUNTER — Encounter: Payer: Self-pay | Admitting: Internal Medicine

## 2013-07-16 VITALS — BP 140/70 | HR 64 | Temp 97.7°F | Wt 163.0 lb

## 2013-07-16 DIAGNOSIS — I10 Essential (primary) hypertension: Secondary | ICD-10-CM | POA: Diagnosis not present

## 2013-07-16 DIAGNOSIS — Z23 Encounter for immunization: Secondary | ICD-10-CM | POA: Diagnosis not present

## 2013-07-16 DIAGNOSIS — D6489 Other specified anemias: Secondary | ICD-10-CM

## 2013-07-16 DIAGNOSIS — D638 Anemia in other chronic diseases classified elsewhere: Secondary | ICD-10-CM | POA: Diagnosis not present

## 2013-07-16 LAB — CBC WITH DIFFERENTIAL/PLATELET
Basophils Absolute: 0 10*3/uL (ref 0.0–0.1)
Basophils Relative: 0.7 % (ref 0.0–3.0)
Eosinophils Absolute: 0.2 10*3/uL (ref 0.0–0.7)
Eosinophils Relative: 2.8 % (ref 0.0–5.0)
HCT: 34.1 % — ABNORMAL LOW (ref 36.0–46.0)
Hemoglobin: 11.4 g/dL — ABNORMAL LOW (ref 12.0–15.0)
Lymphocytes Relative: 24.2 % (ref 12.0–46.0)
Lymphs Abs: 1.5 10*3/uL (ref 0.7–4.0)
MCHC: 33.3 g/dL (ref 30.0–36.0)
MCV: 90.8 fl (ref 78.0–100.0)
Monocytes Absolute: 0.6 10*3/uL (ref 0.1–1.0)
Monocytes Relative: 9.6 % (ref 3.0–12.0)
Neutro Abs: 3.9 10*3/uL (ref 1.4–7.7)
Neutrophils Relative %: 62.7 % (ref 43.0–77.0)
Platelets: 168 10*3/uL (ref 150.0–400.0)
RBC: 3.76 Mil/uL — ABNORMAL LOW (ref 3.87–5.11)
RDW: 13.5 % (ref 11.5–14.6)
WBC: 6.2 10*3/uL (ref 4.5–10.5)

## 2013-07-16 MED ORDER — AMLODIPINE BESYLATE 5 MG PO TABS: 5.0000 mg | ORAL_TABLET | Freq: Every day | ORAL | Status: DC

## 2013-07-16 MED ORDER — IRBESARTAN-HYDROCHLOROTHIAZIDE 150-12.5 MG PO TABS: 1.0000 | ORAL_TABLET | Freq: Every day | ORAL | Status: DC

## 2013-07-16 NOTE — Progress Notes (Signed)
Pre visit review using our clinic review tool, if applicable. No additional management support is needed unless otherwise documented below in the visit note. 

## 2013-07-16 NOTE — Progress Notes (Signed)
Subjective:    Patient ID: Sara Garza, female    DOB: 09/16/1923, 80 y.o.   MRN: [place]:[id number]  HPI Dr Burt Knack started zetia for lipids and TG elevation Started 2 months ago and has blood work in June    Review of Systems     Past Medical History  Diagnosis Date  . Hyperlipidemia   . Hypertension   . Osteoporosis   . PMR (polymyalgia rheumatica)   . Diabetes mellitus   . Arthritis   . Carotid stenosis, left     50-60% stable    History   Social History  . Marital Status: Widowed    Spouse Name: N/A    Number of Children: N/A  . Years of Education: N/A   Occupational History  . Not on file.   Social History Main Topics  . Smoking status: Never Smoker   . Smokeless tobacco: Never Used  . Alcohol Use: Yes     Comment: very seldom  . Drug Use: No  . Sexual Activity: Not Currently   Other Topics Concern  . Not on file   Social History Narrative  . No narrative on file    Past Surgical History  Procedure Laterality Date  . Cesarean section    . Appendectomy    . Foot surgery      [name]  . Cardiac valve replacement  04/29/2012    Family History  Problem Relation Age of Onset  . Lung cancer    . COPD Father   . Cancer Father     lung cancer    Allergies  Allergen Reactions  . Statins Other (See Comments)    Muscle aches    Current Outpatient Prescriptions on File Prior to Visit  Medication Sig Dispense Refill  . amoxicillin (AMOXIL) 500 MG tablet Take 4 tablets one hour prior to dental procedures  8 tablet  2  . aspirin 81 MG tablet Take 81 mg by mouth daily.        . bimatoprost (LUMIGAN) 0.01 % SOLN 1 drop at bedtime.      . Calcium Carb-Cholecalciferol (CALCIUM 500 +D) 500-400 MG-UNIT TABS Take by mouth daily.       [name] EXFORGE HCT 5-160-12.5 MG TABS       . ezetimibe (ZETIA) 10 MG tablet Take 1 tablet (10 mg total) by mouth daily.  90 tablet  3  . Multiple Vitamin (MULTIVITAMIN) tablet Take 1 tablet by mouth daily.         No current  facility-administered medications on file prior to visit.    BP 140/70  Pulse 64  Temp(Src) 97.7 F (36.5 C) (Oral)  Wt 163 lb (73.936 kg)    Objective:   Physical Exam  Constitutional: She is oriented to person, place, and time. She appears well-developed and well-nourished.  HENT:  Head: Normocephalic and atraumatic.  Eyes: Conjunctivae are normal. Pupils are equal, round, and reactive to light.  Neck: No JVD present. No tracheal deviation present. No thyromegaly present.  Cardiovascular: Normal rate and regular rhythm.   Murmur heard. Pulmonary/Chest: Effort normal and breath sounds normal.  Abdominal: Soft. Bowel sounds are normal.  Neurological: She is alert and oriented to person, place, and time.          Assessment & Plan:  Formulary mandated change in blood pressure despite stable control! Change to irbesartin and HCT and amlodipine   Stable valve  New lipid medication tolerated  Due tetnus today  Cbc for mild  anemia    Follow up

## 2013-07-16 NOTE — Patient Instructions (Signed)
The patient is instructed to continue all medications as prescribed. Schedule followup with check out clerk upon leaving the clinic  

## 2013-07-16 NOTE — Addendum Note (Signed)
Addended by: Westley Hummer B on: 07/16/2013 12:00 PM   Modules accepted: Orders

## 2013-07-31 ENCOUNTER — Inpatient Hospital Stay (HOSPITAL_COMMUNITY)
Admission: EM | Admit: 2013-07-31 | Discharge: 2013-08-03 | DRG: 470 | Disposition: A | Discharge status: Skilled Nursing Facility | Payer: Medicare Other | Attending: Internal Medicine | Admitting: Internal Medicine

## 2013-07-31 ENCOUNTER — Encounter (HOSPITAL_COMMUNITY): Payer: Medicare Other | Admitting: Anesthesiology

## 2013-07-31 ENCOUNTER — Encounter (HOSPITAL_COMMUNITY)
Admission: EM | Disposition: A | Discharge status: Skilled Nursing Facility | Payer: Self-pay | Source: Home / Self Care | Attending: Internal Medicine

## 2013-07-31 ENCOUNTER — Inpatient Hospital Stay (HOSPITAL_COMMUNITY): Payer: Medicare Other | Admitting: Anesthesiology

## 2013-07-31 ENCOUNTER — Inpatient Hospital Stay (HOSPITAL_COMMUNITY): Payer: Medicare Other

## 2013-07-31 ENCOUNTER — Encounter (HOSPITAL_COMMUNITY): Payer: Self-pay | Admitting: Emergency Medicine

## 2013-07-31 ENCOUNTER — Emergency Department (HOSPITAL_COMMUNITY): Payer: Medicare Other

## 2013-07-31 DIAGNOSIS — I2510 Atherosclerotic heart disease of native coronary artery without angina pectoris: Secondary | ICD-10-CM | POA: Diagnosis not present

## 2013-07-31 DIAGNOSIS — M719 Bursopathy, unspecified: Secondary | ICD-10-CM

## 2013-07-31 DIAGNOSIS — S72009D Fracture of unspecified part of neck of unspecified femur, subsequent encounter for closed fracture with routine healing: Secondary | ICD-10-CM | POA: Diagnosis not present

## 2013-07-31 DIAGNOSIS — N183 Chronic kidney disease, stage 3 unspecified: Secondary | ICD-10-CM | POA: Diagnosis present

## 2013-07-31 DIAGNOSIS — E1129 Type 2 diabetes mellitus with other diabetic kidney complication: Secondary | ICD-10-CM | POA: Diagnosis present

## 2013-07-31 DIAGNOSIS — M6740 Ganglion, unspecified site: Secondary | ICD-10-CM

## 2013-07-31 DIAGNOSIS — E785 Hyperlipidemia, unspecified: Secondary | ICD-10-CM | POA: Diagnosis present

## 2013-07-31 DIAGNOSIS — Z96649 Presence of unspecified artificial hip joint: Secondary | ICD-10-CM | POA: Diagnosis present

## 2013-07-31 DIAGNOSIS — N289 Disorder of kidney and ureter, unspecified: Secondary | ICD-10-CM

## 2013-07-31 DIAGNOSIS — M79609 Pain in unspecified limb: Secondary | ICD-10-CM | POA: Diagnosis not present

## 2013-07-31 DIAGNOSIS — M810 Age-related osteoporosis without current pathological fracture: Secondary | ICD-10-CM | POA: Diagnosis present

## 2013-07-31 DIAGNOSIS — I10 Essential (primary) hypertension: Secondary | ICD-10-CM | POA: Diagnosis not present

## 2013-07-31 DIAGNOSIS — M1990 Unspecified osteoarthritis, unspecified site: Secondary | ICD-10-CM

## 2013-07-31 DIAGNOSIS — M67919 Unspecified disorder of synovium and tendon, unspecified shoulder: Secondary | ICD-10-CM

## 2013-07-31 DIAGNOSIS — Z954 Presence of other heart-valve replacement: Secondary | ICD-10-CM

## 2013-07-31 DIAGNOSIS — E119 Type 2 diabetes mellitus without complications: Secondary | ICD-10-CM | POA: Diagnosis present

## 2013-07-31 DIAGNOSIS — M25559 Pain in unspecified hip: Secondary | ICD-10-CM | POA: Diagnosis not present

## 2013-07-31 DIAGNOSIS — H409 Unspecified glaucoma: Secondary | ICD-10-CM

## 2013-07-31 DIAGNOSIS — M129 Arthropathy, unspecified: Secondary | ICD-10-CM | POA: Diagnosis present

## 2013-07-31 DIAGNOSIS — Z471 Aftercare following joint replacement surgery: Secondary | ICD-10-CM | POA: Diagnosis not present

## 2013-07-31 DIAGNOSIS — I350 Nonrheumatic aortic (valve) stenosis: Secondary | ICD-10-CM

## 2013-07-31 DIAGNOSIS — Z5189 Encounter for other specified aftercare: Secondary | ICD-10-CM | POA: Diagnosis not present

## 2013-07-31 DIAGNOSIS — S298XXA Other specified injuries of thorax, initial encounter: Secondary | ICD-10-CM | POA: Diagnosis not present

## 2013-07-31 DIAGNOSIS — I129 Hypertensive chronic kidney disease with stage 1 through stage 4 chronic kidney disease, or unspecified chronic kidney disease: Secondary | ICD-10-CM | POA: Diagnosis present

## 2013-07-31 DIAGNOSIS — E1142 Type 2 diabetes mellitus with diabetic polyneuropathy: Secondary | ICD-10-CM

## 2013-07-31 DIAGNOSIS — R0789 Other chest pain: Secondary | ICD-10-CM

## 2013-07-31 DIAGNOSIS — I359 Nonrheumatic aortic valve disorder, unspecified: Secondary | ICD-10-CM | POA: Diagnosis present

## 2013-07-31 DIAGNOSIS — W1789XA Other fall from one level to another, initial encounter: Secondary | ICD-10-CM | POA: Diagnosis present

## 2013-07-31 DIAGNOSIS — Z801 Family history of malignant neoplasm of trachea, bronchus and lung: Secondary | ICD-10-CM

## 2013-07-31 DIAGNOSIS — K5900 Constipation, unspecified: Secondary | ICD-10-CM | POA: Diagnosis present

## 2013-07-31 DIAGNOSIS — I447 Left bundle-branch block, unspecified: Secondary | ICD-10-CM | POA: Diagnosis present

## 2013-07-31 DIAGNOSIS — S72001A Fracture of unspecified part of neck of right femur, initial encounter for closed fracture: Secondary | ICD-10-CM

## 2013-07-31 DIAGNOSIS — N184 Chronic kidney disease, stage 4 (severe): Secondary | ICD-10-CM | POA: Diagnosis present

## 2013-07-31 DIAGNOSIS — S72009A Fracture of unspecified part of neck of unspecified femur, initial encounter for closed fracture: Principal | ICD-10-CM | POA: Diagnosis present

## 2013-07-31 DIAGNOSIS — M353 Polymyalgia rheumatica: Secondary | ICD-10-CM | POA: Diagnosis present

## 2013-07-31 DIAGNOSIS — I6529 Occlusion and stenosis of unspecified carotid artery: Secondary | ICD-10-CM | POA: Diagnosis present

## 2013-07-31 DIAGNOSIS — J9819 Other pulmonary collapse: Secondary | ICD-10-CM | POA: Diagnosis not present

## 2013-07-31 DIAGNOSIS — E1169 Type 2 diabetes mellitus with other specified complication: Secondary | ICD-10-CM | POA: Diagnosis present

## 2013-07-31 DIAGNOSIS — R296 Repeated falls: Secondary | ICD-10-CM | POA: Diagnosis not present

## 2013-07-31 DIAGNOSIS — S72023A Displaced fracture of epiphysis (separation) (upper) of unspecified femur, initial encounter for closed fracture: Secondary | ICD-10-CM | POA: Diagnosis not present

## 2013-07-31 DIAGNOSIS — M25519 Pain in unspecified shoulder: Secondary | ICD-10-CM

## 2013-07-31 DIAGNOSIS — Z9181 History of falling: Secondary | ICD-10-CM | POA: Diagnosis not present

## 2013-07-31 HISTORY — PX: HIP ARTHROPLASTY: SHX981

## 2013-07-31 LAB — BASIC METABOLIC PANEL
BUN: 45 mg/dL — ABNORMAL HIGH (ref 6–23)
CHLORIDE: 104 meq/L (ref 96–112)
CO2: 20 mEq/L (ref 19–32)
Calcium: 9.2 mg/dL (ref 8.4–10.5)
Creatinine, Ser: 1.99 mg/dL — ABNORMAL HIGH (ref 0.50–1.10)
GFR calc non Af Amer: 21 mL/min — ABNORMAL LOW (ref >90)
GFR, EST AFRICAN AMERICAN: 25 mL/min — AB (ref >90)
GLUCOSE: 123 mg/dL — AB (ref 70–99)
POTASSIUM: 4.6 meq/L (ref 3.7–5.3)
Sodium: 141 mEq/L (ref 137–147)

## 2013-07-31 LAB — GLUCOSE, CAPILLARY
GLUCOSE-CAPILLARY: 101 mg/dL — AB (ref 70–99)
Glucose-Capillary: 117 mg/dL — ABNORMAL HIGH (ref 70–99)
Glucose-Capillary: 115 mg/dL — ABNORMAL HIGH (ref 70–99)
Glucose-Capillary: 151 mg/dL — ABNORMAL HIGH (ref 70–99)

## 2013-07-31 LAB — CBC WITH DIFFERENTIAL/PLATELET
Basophils Absolute: 0 10*3/uL (ref 0.0–0.1)
Basophils Relative: 0 % (ref 0–1)
EOS ABS: 0 10*3/uL (ref 0.0–0.7)
Eosinophils Relative: 0 % (ref 0–5)
HEMATOCRIT: 33.9 % — AB (ref 36.0–46.0)
HEMOGLOBIN: 11.6 g/dL — AB (ref 12.0–15.0)
LYMPHS ABS: 0.9 10*3/uL (ref 0.7–4.0)
Lymphocytes Relative: 7 % — ABNORMAL LOW (ref 12–46)
MCH: 30.4 pg (ref 26.0–34.0)
MCHC: 34.2 g/dL (ref 30.0–36.0)
MCV: 89 fL (ref 78.0–100.0)
MONOS PCT: 5 % (ref 3–12)
Monocytes Absolute: 0.7 10*3/uL (ref 0.1–1.0)
NEUTROS ABS: 11.6 10*3/uL — AB (ref 1.7–7.7)
Neutrophils Relative %: 88 % — ABNORMAL HIGH (ref 43–77)
Platelets: 173 10*3/uL (ref 150–400)
RBC: 3.81 MIL/uL — ABNORMAL LOW (ref 3.87–5.11)
RDW: 12.9 % (ref 11.5–15.5)
WBC: 13.2 10*3/uL — ABNORMAL HIGH (ref 4.0–10.5)

## 2013-07-31 LAB — URINE MICROSCOPIC-ADD ON

## 2013-07-31 LAB — CALCIUM: Calcium: 8.6 mg/dL (ref 8.4–10.5)

## 2013-07-31 LAB — TYPE AND SCREEN
ABO/RH(D): B POS
Antibody Screen: NEGATIVE

## 2013-07-31 LAB — PROTIME-INR
INR: 0.99 (ref 0.00–1.49)
Prothrombin Time: 12.9 seconds (ref 11.6–15.2)

## 2013-07-31 LAB — ALBUMIN: Albumin: 3.3 g/dL — ABNORMAL LOW (ref 3.5–5.2)

## 2013-07-31 LAB — URINALYSIS, ROUTINE W REFLEX MICROSCOPIC
Bilirubin Urine: NEGATIVE
Glucose, UA: NEGATIVE mg/dL
Hgb urine dipstick: NEGATIVE
KETONES UR: NEGATIVE mg/dL
NITRITE: NEGATIVE
PROTEIN: NEGATIVE mg/dL
Specific Gravity, Urine: 1.019 (ref 1.005–1.030)
Urobilinogen, UA: 1 mg/dL (ref 0.0–1.0)
pH: 5.5 (ref 5.0–8.0)

## 2013-07-31 LAB — CBG MONITORING, ED: Glucose-Capillary: 152 mg/dL — ABNORMAL HIGH (ref 70–99)

## 2013-07-31 LAB — HEMOGLOBIN A1C
Hgb A1c MFr Bld: 6 % — ABNORMAL HIGH (ref <5.7)
MEAN PLASMA GLUCOSE: 126 mg/dL — AB (ref <117)

## 2013-07-31 LAB — TROPONIN I: Troponin I: <0.3 ng/mL (ref <0.30)

## 2013-07-31 LAB — ABO/RH: ABO/RH(D): B POS

## 2013-07-31 SURGERY — HEMIARTHROPLASTY, HIP, DIRECT ANTERIOR APPROACH, FOR FRACTURE
Anesthesia: Spinal | Site: Hip | Laterality: Right

## 2013-07-31 MED ORDER — PHENYLEPHRINE HCL 10 MG/ML IJ SOLN
20.0000 mg | INTRAVENOUS | Status: DC | PRN
  Administered 2013-07-31: 10 ug/min via INTRAVENOUS

## 2013-07-31 MED ORDER — METHOCARBAMOL 500 MG PO TABS: 500.0000 mg | ORAL_TABLET | Freq: Four times a day (QID) | ORAL | Status: DC | PRN

## 2013-07-31 MED ORDER — METHOCARBAMOL 1000 MG/10ML IJ SOLN: 500.0000 mg | Freq: Four times a day (QID) | INTRAMUSCULAR | Status: DC | PRN

## 2013-07-31 MED ORDER — PROMETHAZINE HCL 25 MG/ML IJ SOLN: 6.2500 mg | INTRAMUSCULAR | Status: DC | PRN

## 2013-07-31 MED ORDER — ONDANSETRON HCL 4 MG PO TABS: 4.0000 mg | ORAL_TABLET | Freq: Four times a day (QID) | ORAL | Status: DC | PRN

## 2013-07-31 MED ORDER — METOCLOPRAMIDE HCL 5 MG PO TABS: 5.0000 mg | ORAL_TABLET | Freq: Three times a day (TID) | ORAL | Status: DC | PRN

## 2013-07-31 MED ORDER — PROPOFOL 10 MG/ML IV BOLUS
INTRAVENOUS | Status: AC
  Filled 2013-07-31: qty 20

## 2013-07-31 MED ORDER — LACTATED RINGERS IV SOLN
INTRAVENOUS | Status: DC | PRN
  Administered 2013-07-31: 13:00:00 via INTRAVENOUS

## 2013-07-31 MED ORDER — STERILE WATER FOR IRRIGATION IR SOLN
Status: DC | PRN
  Administered 2013-07-31: 1500 mL

## 2013-07-31 MED ORDER — 0.9 % SODIUM CHLORIDE (POUR BTL) OPTIME
TOPICAL | Status: DC | PRN
  Administered 2013-07-31: 1000 mL

## 2013-07-31 MED ORDER — MIDAZOLAM HCL 5 MG/5ML IJ SOLN
INTRAMUSCULAR | Status: DC | PRN
  Administered 2013-07-31 (×2): 0.5 mg via INTRAVENOUS

## 2013-07-31 MED ORDER — DOCUSATE SODIUM 100 MG PO CAPS
100.0000 mg | ORAL_CAPSULE | Freq: Two times a day (BID) | ORAL | Status: DC
  Filled 2013-07-31 (×2): qty 1

## 2013-07-31 MED ORDER — SODIUM CHLORIDE 0.9 % IV SOLN
INTRAVENOUS | Status: AC
  Administered 2013-07-31: 10:00:00 via INTRAVENOUS

## 2013-07-31 MED ORDER — FENTANYL CITRATE 0.05 MG/ML IJ SOLN
INTRAMUSCULAR | Status: AC
  Filled 2013-07-31: qty 2

## 2013-07-31 MED ORDER — HYDROMORPHONE HCL PF 1 MG/ML IJ SOLN
0.5000 mg | INTRAMUSCULAR | Status: DC | PRN
  Administered 2013-07-31: 0.5 mg via INTRAVENOUS
  Administered 2013-08-01: 1 mg via INTRAVENOUS
  Filled 2013-07-31 (×2): qty 1

## 2013-07-31 MED ORDER — PANTOPRAZOLE SODIUM 40 MG PO TBEC
40.0000 mg | DELAYED_RELEASE_TABLET | Freq: Every day | ORAL | Status: DC
  Administered 2013-07-31 – 2013-08-03 (×4): 40 mg via ORAL
  Filled 2013-07-31 (×4): qty 1

## 2013-07-31 MED ORDER — METHOCARBAMOL 500 MG PO TABS
500.0000 mg | ORAL_TABLET | Freq: Four times a day (QID) | ORAL | Status: DC | PRN
  Administered 2013-07-31: 500 mg via ORAL
  Filled 2013-07-31: qty 1

## 2013-07-31 MED ORDER — CHLORHEXIDINE GLUCONATE 4 % EX LIQD
60.0000 mL | Freq: Once | CUTANEOUS | Status: DC
  Filled 2013-07-31: qty 60

## 2013-07-31 MED ORDER — HYDROCODONE-ACETAMINOPHEN 5-325 MG PO TABS
1.0000 | ORAL_TABLET | Freq: Four times a day (QID) | ORAL | Status: DC | PRN
  Administered 2013-08-01 – 2013-08-03 (×5): 1 via ORAL
  Filled 2013-07-31 (×7): qty 1

## 2013-07-31 MED ORDER — LATANOPROST 0.005 % OP SOLN: 1.0000 [drp] | Freq: Every day | OPHTHALMIC | Status: DC

## 2013-07-31 MED ORDER — HYDROCODONE-ACETAMINOPHEN 5-325 MG PO TABS: 1.0000 | ORAL_TABLET | Freq: Four times a day (QID) | ORAL | Status: DC | PRN

## 2013-07-31 MED ORDER — KETAMINE HCL 10 MG/ML IJ SOLN
INTRAMUSCULAR | Status: AC
  Filled 2013-07-31: qty 1

## 2013-07-31 MED ORDER — KETAMINE HCL 10 MG/ML IJ SOLN
INTRAMUSCULAR | Status: DC | PRN
  Administered 2013-07-31: 30 mg via INTRAVENOUS

## 2013-07-31 MED ORDER — DOCUSATE SODIUM 100 MG PO CAPS
100.0000 mg | ORAL_CAPSULE | Freq: Two times a day (BID) | ORAL | Status: DC
  Administered 2013-07-31 – 2013-08-03 (×6): 100 mg via ORAL
  Filled 2013-07-31 (×7): qty 1

## 2013-07-31 MED ORDER — ONDANSETRON HCL 4 MG/2ML IJ SOLN
INTRAMUSCULAR | Status: AC
  Filled 2013-07-31: qty 2

## 2013-07-31 MED ORDER — LEVOFLOXACIN IN D5W 750 MG/150ML IV SOLN
750.0000 mg | Freq: Once | INTRAVENOUS | Status: AC
  Administered 2013-07-31: 750 mg via INTRAVENOUS
  Filled 2013-07-31: qty 150

## 2013-07-31 MED ORDER — CEFAZOLIN SODIUM-DEXTROSE 2-3 GM-% IV SOLR
INTRAVENOUS | Status: AC
  Filled 2013-07-31: qty 50

## 2013-07-31 MED ORDER — LEVOFLOXACIN IN D5W 500 MG/100ML IV SOLN: 500.0000 mg | INTRAVENOUS | Status: DC

## 2013-07-31 MED ORDER — AMLODIPINE BESYLATE 5 MG PO TABS
5.0000 mg | ORAL_TABLET | Freq: Every day | ORAL | Status: DC
  Administered 2013-08-01 – 2013-08-03 (×3): 5 mg via ORAL
  Filled 2013-07-31 (×3): qty 1

## 2013-07-31 MED ORDER — LIDOCAINE HCL (CARDIAC) 20 MG/ML IV SOLN
INTRAVENOUS | Status: DC | PRN
  Administered 2013-07-31: 100 mg via INTRAVENOUS

## 2013-07-31 MED ORDER — SODIUM CHLORIDE 0.9 % IV SOLN
INTRAVENOUS | Status: DC
  Administered 2013-07-31 – 2013-08-02 (×3): via INTRAVENOUS
  Filled 2013-07-31 (×10): qty 1000

## 2013-07-31 MED ORDER — FENTANYL CITRATE 0.05 MG/ML IJ SOLN
INTRAMUSCULAR | Status: DC | PRN
  Administered 2013-07-31: 50 ug via INTRAVENOUS

## 2013-07-31 MED ORDER — PHENYLEPHRINE HCL 10 MG/ML IJ SOLN
INTRAMUSCULAR | Status: AC
  Filled 2013-07-31: qty 2

## 2013-07-31 MED ORDER — ENOXAPARIN SODIUM 40 MG/0.4ML ~~LOC~~ SOLN: 40.0000 mg | SUBCUTANEOUS | Status: DC

## 2013-07-31 MED ORDER — FENTANYL CITRATE 0.05 MG/ML IJ SOLN
50.0000 ug | Freq: Once | INTRAMUSCULAR | Status: AC
  Administered 2013-07-31: 50 ug via INTRAVENOUS
  Filled 2013-07-31: qty 2

## 2013-07-31 MED ORDER — PHENOL 1.4 % MT LIQD: 1.0000 | OROMUCOSAL | Status: DC | PRN

## 2013-07-31 MED ORDER — METOPROLOL TARTRATE 1 MG/ML IV SOLN
2.5000 mg | Freq: Two times a day (BID) | INTRAVENOUS | Status: DC
  Filled 2013-07-31 (×3): qty 5

## 2013-07-31 MED ORDER — EZETIMIBE 10 MG PO TABS
10.0000 mg | ORAL_TABLET | Freq: Every day | ORAL | Status: DC
  Administered 2013-08-01 – 2013-08-03 (×3): 10 mg via ORAL
  Filled 2013-07-31 (×3): qty 1

## 2013-07-31 MED ORDER — MORPHINE SULFATE 2 MG/ML IJ SOLN
0.5000 mg | INTRAMUSCULAR | Status: DC | PRN
  Administered 2013-07-31: 0.5 mg via INTRAVENOUS
  Filled 2013-07-31: qty 1

## 2013-07-31 MED ORDER — OXYCODONE-ACETAMINOPHEN 5-325 MG PO TABS: 1.0000 | ORAL_TABLET | ORAL | Status: DC | PRN

## 2013-07-31 MED ORDER — MENTHOL 3 MG MT LOZG: 1.0000 | LOZENGE | OROMUCOSAL | Status: DC | PRN

## 2013-07-31 MED ORDER — MIDAZOLAM HCL 2 MG/2ML IJ SOLN
INTRAMUSCULAR | Status: AC
  Filled 2013-07-31: qty 2

## 2013-07-31 MED ORDER — LATANOPROST 0.005 % OP SOLN
1.0000 [drp] | Freq: Every day | OPHTHALMIC | Status: DC
  Administered 2013-07-31 – 2013-08-02 (×3): 1 [drp] via OPHTHALMIC
  Filled 2013-07-31 (×2): qty 2.5

## 2013-07-31 MED ORDER — ONDANSETRON HCL 4 MG/2ML IJ SOLN
4.0000 mg | Freq: Once | INTRAMUSCULAR | Status: AC
  Administered 2013-07-31: 4 mg via INTRAVENOUS
  Filled 2013-07-31: qty 2

## 2013-07-31 MED ORDER — LIDOCAINE HCL (CARDIAC) 20 MG/ML IV SOLN
INTRAVENOUS | Status: AC
  Filled 2013-07-31: qty 5

## 2013-07-31 MED ORDER — INSULIN ASPART 100 UNIT/ML ~~LOC~~ SOLN
0.0000 [IU] | SUBCUTANEOUS | Status: DC
  Administered 2013-07-31: 2 [IU] via SUBCUTANEOUS

## 2013-07-31 MED ORDER — ROCURONIUM BROMIDE 100 MG/10ML IV SOLN
INTRAVENOUS | Status: AC
  Filled 2013-07-31: qty 1

## 2013-07-31 MED ORDER — FERROUS SULFATE 325 (65 FE) MG PO TABS
325.0000 mg | ORAL_TABLET | Freq: Three times a day (TID) | ORAL | Status: DC
  Administered 2013-08-01 – 2013-08-03 (×8): 325 mg via ORAL
  Filled 2013-07-31 (×11): qty 1

## 2013-07-31 MED ORDER — CEFAZOLIN SODIUM-DEXTROSE 2-3 GM-% IV SOLR
2.0000 g | Freq: Four times a day (QID) | INTRAVENOUS | Status: AC
  Administered 2013-07-31 – 2013-08-01 (×2): 2 g via INTRAVENOUS
  Filled 2013-07-31 (×3): qty 50

## 2013-07-31 MED ORDER — METOCLOPRAMIDE HCL 5 MG/ML IJ SOLN: 5.0000 mg | Freq: Three times a day (TID) | INTRAMUSCULAR | Status: DC | PRN

## 2013-07-31 MED ORDER — BUPIVACAINE IN DEXTROSE 0.75-8.25 % IT SOLN
INTRATHECAL | Status: DC | PRN
  Administered 2013-07-31: 1.6 mL via INTRATHECAL

## 2013-07-31 MED ORDER — ONDANSETRON HCL 4 MG/2ML IJ SOLN
4.0000 mg | Freq: Four times a day (QID) | INTRAMUSCULAR | Status: DC | PRN
  Administered 2013-07-31: 4 mg via INTRAVENOUS
  Filled 2013-07-31: qty 2

## 2013-07-31 MED ORDER — ONDANSETRON HCL 4 MG/2ML IJ SOLN
INTRAMUSCULAR | Status: DC | PRN
  Administered 2013-07-31: 4 mg via INTRAVENOUS

## 2013-07-31 MED ORDER — DEXTROSE 5 % IV SOLN
500.0000 mg | Freq: Four times a day (QID) | INTRAVENOUS | Status: DC | PRN
  Administered 2013-07-31: 500 mg via INTRAVENOUS
  Filled 2013-07-31: qty 5

## 2013-07-31 MED ORDER — ACETAMINOPHEN 325 MG PO TABS
650.0000 mg | ORAL_TABLET | Freq: Four times a day (QID) | ORAL | Status: DC | PRN
Start: 2013-07-31 — End: 2013-08-03

## 2013-07-31 MED ORDER — ALUM & MAG HYDROXIDE-SIMETH 200-200-20 MG/5ML PO SUSP: 30.0000 mL | ORAL | Status: DC | PRN

## 2013-07-31 MED ORDER — ENOXAPARIN SODIUM 40 MG/0.4ML ~~LOC~~ SOLN
40.0000 mg | SUBCUTANEOUS | Status: DC
  Administered 2013-08-01: 40 mg via SUBCUTANEOUS
  Filled 2013-07-31 (×3): qty 0.4

## 2013-07-31 MED ORDER — POLYETHYLENE GLYCOL 3350 17 G PO PACK: 17.0000 g | PACK | Freq: Every day | ORAL | Status: DC | PRN

## 2013-07-31 MED ORDER — CEFAZOLIN SODIUM-DEXTROSE 2-3 GM-% IV SOLR
2.0000 g | INTRAVENOUS | Status: AC
  Administered 2013-07-31: 2 g via INTRAVENOUS
  Filled 2013-07-31: qty 50

## 2013-07-31 MED ORDER — ONDANSETRON HCL 4 MG/2ML IJ SOLN
4.0000 mg | Freq: Once | INTRAMUSCULAR | Status: AC
Start: 2013-07-31 — End: 2013-07-31
  Administered 2013-07-31: 4 mg via INTRAVENOUS
  Filled 2013-07-31: qty 2

## 2013-07-31 MED ORDER — FENTANYL CITRATE 0.05 MG/ML IJ SOLN
50.0000 ug | INTRAMUSCULAR | Status: DC | PRN
  Administered 2013-07-31: 50 ug via INTRAVENOUS
  Filled 2013-07-31: qty 2

## 2013-07-31 MED ORDER — ACETAMINOPHEN 650 MG RE SUPP: 650.0000 mg | Freq: Four times a day (QID) | RECTAL | Status: DC | PRN

## 2013-07-31 MED ORDER — HYDROCODONE-ACETAMINOPHEN 5-325 MG PO TABS
1.0000 | ORAL_TABLET | Freq: Four times a day (QID) | ORAL | Status: DC | PRN
  Administered 2013-07-31 – 2013-08-01 (×2): 1 via ORAL

## 2013-07-31 MED ORDER — FENTANYL CITRATE 0.05 MG/ML IJ SOLN: 25.0000 ug | INTRAMUSCULAR | Status: DC | PRN

## 2013-07-31 MED ORDER — PROPOFOL INFUSION 10 MG/ML OPTIME
INTRAVENOUS | Status: DC | PRN
  Administered 2013-07-31: 75 ug/kg/min via INTRAVENOUS

## 2013-07-31 SURGICAL SUPPLY — 49 items
ADH SKN CLS APL DERMABOND .7 (GAUZE/BANDAGES/DRESSINGS) ×1
BAG SPEC THK2 15X12 ZIP CLS (MISCELLANEOUS) ×1
BAG ZIPLOCK 12X15 (MISCELLANEOUS) ×2 IMPLANT
BLADE SAW SGTL 18X1.27X75 (BLADE) ×2 IMPLANT
CAPT HIP FX BIPOLAR/UNIPOLAR ×2 IMPLANT
DERMABOND ADVANCED (GAUZE/BANDAGES/DRESSINGS) ×1
DERMABOND ADVANCED .7 DNX12 (GAUZE/BANDAGES/DRESSINGS) ×1 IMPLANT
DRAPE INCISE IOBAN 85X60 (DRAPES) ×2 IMPLANT
DRAPE ORTHO SPLIT 77X108 STRL (DRAPES) ×4
DRAPE POUCH INSTRU U-SHP 10X18 (DRAPES) ×2 IMPLANT
DRAPE SURG 17X11 SM STRL (DRAPES) ×2 IMPLANT
DRAPE SURG ORHT 6 SPLT 77X108 (DRAPES) ×2 IMPLANT
DRAPE U-SHAPE 47X51 STRL (DRAPES) ×2 IMPLANT
DRSG AQUACEL AG ADV 3.5X10 (GAUZE/BANDAGES/DRESSINGS) ×2 IMPLANT
DRSG AQUACEL AG ADV 3.5X14 (GAUZE/BANDAGES/DRESSINGS) ×2 IMPLANT
DRSG TEGADERM 4X4.75 (GAUZE/BANDAGES/DRESSINGS) ×2 IMPLANT
DURAPREP 26ML APPLICATOR (WOUND CARE) ×2 IMPLANT
ELECT BLADE TIP CTD 4 INCH (ELECTRODE) ×2 IMPLANT
ELECT REM PT RETURN 9FT ADLT (ELECTROSURGICAL) ×2
ELECTRODE REM PT RTRN 9FT ADLT (ELECTROSURGICAL) ×1 IMPLANT
EVACUATOR 1/8 PVC DRAIN (DRAIN) ×2 IMPLANT
FACESHIELD WRAPAROUND (MASK) ×8 IMPLANT
FACESHIELD WRAPAROUND OR TEAM (MASK) ×4 IMPLANT
GAUZE SPONGE 2X2 8PLY STRL LF (GAUZE/BANDAGES/DRESSINGS) ×1 IMPLANT
GLOVE BIOGEL PI IND STRL 7.5 (GLOVE) ×1 IMPLANT
GLOVE BIOGEL PI IND STRL 8 (GLOVE) ×1 IMPLANT
GLOVE BIOGEL PI INDICATOR 7.5 (GLOVE) ×1
GLOVE BIOGEL PI INDICATOR 8 (GLOVE) ×1
GLOVE ECLIPSE 8.0 STRL XLNG CF (GLOVE) ×1 IMPLANT
GLOVE ORTHO TXT STRL SZ7.5 (GLOVE) ×4 IMPLANT
GLOVE SURG ORTHO 8.0 STRL STRW (GLOVE) ×2 IMPLANT
GOWN SPEC L3 XXLG W/TWL (GOWN DISPOSABLE) ×4 IMPLANT
GOWN STRL REUS W/TWL LRG LVL3 (GOWN DISPOSABLE) ×2 IMPLANT
HANDPIECE INTERPULSE COAX TIP (DISPOSABLE)
IMMOBILIZER KNEE 20 (SOFTGOODS) IMPLANT
KIT BASIN OR (CUSTOM PROCEDURE TRAY) ×2 IMPLANT
MANIFOLD NEPTUNE II (INSTRUMENTS) ×2 IMPLANT
PACK TOTAL JOINT (CUSTOM PROCEDURE TRAY) ×2 IMPLANT
POSITIONER SURGICAL ARM (MISCELLANEOUS) ×2 IMPLANT
SET HNDPC FAN SPRY TIP SCT (DISPOSABLE) IMPLANT
SPONGE GAUZE 2X2 STER 10/PKG (GAUZE/BANDAGES/DRESSINGS) ×1
STRIP CLOSURE SKIN 1/2X4 (GAUZE/BANDAGES/DRESSINGS) ×4 IMPLANT
SUT ETHIBOND NAB CT1 #1 30IN (SUTURE) ×2 IMPLANT
SUT MNCRL AB 4-0 PS2 18 (SUTURE) ×2 IMPLANT
SUT VIC AB 1 CT1 36 (SUTURE) ×4 IMPLANT
SUT VIC AB 2-0 CT1 27 (SUTURE) ×4
SUT VIC AB 2-0 CT1 TAPERPNT 27 (SUTURE) ×2 IMPLANT
TOWEL OR 17X26 10 PK STRL BLUE (TOWEL DISPOSABLE) ×4 IMPLANT
TRAY FOLEY CATH 14FRSI W/METER (CATHETERS) ×2 IMPLANT

## 2013-07-31 NOTE — Consult Note (Signed)
Reason for Consult:right hip pain Referring Physician: Hospitalist  HPI: Sara Garza is an 80 y.o. female with mechanical fall while out to dinner downtown last night. Admitted by medicine and we were consulted for medical management  Past Medical History  Diagnosis Date  . Hyperlipidemia   . Hypertension   . Osteoporosis   . Diabetes mellitus   . Arthritis   . Carotid stenosis, left     50-60% stable    Past Surgical History  Procedure Laterality Date  . Cesarean section    . Appendectomy    . Foot surgery      [name]  . Cardiac valve replacement  04/29/2012    Family History  Problem Relation Age of Onset  . Lung cancer    . COPD Father   . Cancer Father     lung cancer    Social History:  reports that she has never smoked. She has never used smokeless tobacco. She reports that she drinks alcohol. She reports that she does not use illicit drugs.  Allergies:  Allergies  Allergen Reactions  . Statins Other (See Comments)    Muscle aches    Medications: I have reviewed the patient's current medications.  Results for orders placed during the hospital encounter of 07/31/13 (from the past 48 hour(s))  BASIC METABOLIC PANEL     Status: Abnormal   Collection Time    07/31/13  3:36 AM      Result Value Ref Range   Sodium 141  137 - 147 mEq/L   Potassium 4.6  3.7 - 5.3 mEq/L   Chloride 104  96 - 112 mEq/L   CO2 20  19 - 32 mEq/L   Glucose, Bld 123 (*) 70 - 99 mg/dL   BUN 45 (*) 6 - 23 mg/dL   Creatinine, Ser 1.99 (*) 0.50 - 1.10 mg/dL   Calcium 9.2  8.4 - 10.5 mg/dL   GFR calc non Af Amer 21 (*) >90 mL/min   GFR calc Af Amer 25 (*) >90 mL/min   Comment: (NOTE)     The eGFR has been calculated using the CKD EPI equation.     This calculation has not been validated in all clinical situations.     eGFR's persistently <90 mL/min signify possible Chronic Kidney     Disease.  CBC WITH DIFFERENTIAL     Status: Abnormal   Collection Time    07/31/13  3:36 AM     Result Value Ref Range   WBC 13.2 (*) 4.0 - 10.5 K/uL   RBC 3.81 (*) 3.87 - 5.11 MIL/uL   Hemoglobin 11.6 (*) 12.0 - 15.0 g/dL   HCT 33.9 (*) 36.0 - 46.0 %   MCV 89.0  78.0 - 100.0 fL   MCH 30.4  26.0 - 34.0 pg   MCHC 34.2  30.0 - 36.0 g/dL   RDW 12.9  11.5 - 15.5 %   Platelets 173  150 - 400 K/uL   Neutrophils Relative % 88 (*) 43 - 77 %   Lymphocytes Relative 7 (*) 12 - 46 %   Monocytes Relative 5  3 - 12 %   Eosinophils Relative 0  0 - 5 %   Basophils Relative 0  0 - 1 %   Neutro Abs 11.6 (*) 1.7 - 7.7 K/uL   Lymphs Abs 0.9  0.7 - 4.0 K/uL   Monocytes Absolute 0.7  0.1 - 1.0 K/uL   Eosinophils Absolute 0.0  0.0 - 0.7 K/uL  Basophils Absolute 0.0  0.0 - 0.1 K/uL   Smear Review MORPHOLOGY UNREMARKABLE    PROTIME-INR     Status: None   Collection Time    07/31/13  3:36 AM      Result Value Ref Range   Prothrombin Time 12.9  11.6 - 15.2 seconds   INR 0.99  0.00 - 1.49  TYPE AND SCREEN     Status: None   Collection Time    07/31/13  3:36 AM      Result Value Ref Range   ABO/RH(D) B POS     Antibody Screen NEG     Sample Expiration 08/03/2013    ABO/RH     Status: None   Collection Time    07/31/13  3:36 AM      Result Value Ref Range   ABO/RH(D) B POS    URINALYSIS, ROUTINE W REFLEX MICROSCOPIC     Status: Abnormal   Collection Time    07/31/13  4:16 AM      Result Value Ref Range   Color, Urine YELLOW  YELLOW   APPearance CLEAR  CLEAR   Specific Gravity, Urine 1.019  1.005 - 1.030   pH 5.5  5.0 - 8.0   Glucose, UA NEGATIVE  NEGATIVE mg/dL   Hgb urine dipstick NEGATIVE  NEGATIVE   Bilirubin Urine NEGATIVE  NEGATIVE   Ketones, ur NEGATIVE  NEGATIVE mg/dL   Protein, ur NEGATIVE  NEGATIVE mg/dL   Urobilinogen, UA 1.0  0.0 - 1.0 mg/dL   Nitrite NEGATIVE  NEGATIVE   Leukocytes, UA TRACE (*) NEGATIVE  URINE MICROSCOPIC-ADD ON     Status: None   Collection Time    07/31/13  4:16 AM      Result Value Ref Range   Squamous Epithelial / LPF RARE  RARE   WBC, UA 0-2   <3 WBC/hpf  CBG MONITORING, ED     Status: Abnormal   Collection Time    07/31/13  9:27 AM      Result Value Ref Range   Glucose-Capillary 152 (*) 70 - 99 mg/dL    Dg Hip Complete Right  07/31/2013   CLINICAL DATA:  Right upper leg and hip pain with limited weight-bearing after a fall.  EXAM: RIGHT HIP - COMPLETE 2+ VIEW  COMPARISON:  None.  FINDINGS: Subcapital fracture of the right femoral neck with varus angulation of fracture fragments. No dislocation at the hip joint. Degenerative changes in the right hip. Vascular calcifications. Visualized pelvis appears intact.  IMPRESSION: Fracture the right femoral neck with varus angulation.   Electronically Signed   By: Lucienne Capers M.D.   On: 07/31/2013 04:01   Dg Chest Port 1 View  07/31/2013   CLINICAL DATA:  And fall.  Hip fracture.  Right leg pain.  EXAM: PORTABLE CHEST - 1 VIEW  COMPARISON:  DG DEXA AXIAL SKELETON - NRPT [facility] dated 03/17/2013; DG RIBS UNILATERAL*R* dated 12/29/2006  FINDINGS: Shallow inspiration. Borderline heart size and pulmonary vascularity are likely normal for technique. Suggestion of linear atelectasis in the left lung base. Calcification in the mitral valve annulus with stent in the heart. No focal airspace disease. No blunting of costophrenic angles. No pneumothorax. Degenerative changes in the spine and shoulders.  IMPRESSION: Shallow inspiration with atelectasis in the left lung base.   Electronically Signed   By: Lucienne Capers M.D.   On: 07/31/2013 04:33    ROS: as per H&P  Physical Exam  Alert and oriented Family present  at bedside Right hip with pain on attempts of ROM RLE shortened and ER NVI  Vitals Temp:  [97.9 F (36.6 C)] 97.9 F (36.6 C) (05/02 0201) Pulse Rate:  [94-115] 115 (05/02 0940) Resp:  [14-18] 14 (05/02 0940) BP: (145-174)/(57-69) 145/69 mmHg (05/02 0833) SpO2:  [88 %-96 %] 93 % (05/02 0940) There is no weight on file to calculate BMI.  Assessment/Plan: Impression: displaced right  femoral neck fracture Treatment: plan OR today for right hip hemiarthroplasty Cont NPO  Jenetta Loges for Dr. Lennette Bihari Supple 07/31/2013, 9:50 AM

## 2013-07-31 NOTE — H&P (Signed)
PCP: Georgetta Haber, MD  Cardiologist Burt Knack  Chief Complaint:  Hip pain  HPI: Sara Garza is a 80 y.o. female   has a past medical history of Hyperlipidemia; Hypertension; Osteoporosis; PMR (polymyalgia rheumatica); Diabetes mellitus; Arthritis; and Carotid stenosis, left.   Presented with  Patient fell down while walking due to tripping while walking up the hill. She fell backwards. She denies hitting her head. She was supported to get home but very soon the pain was too much to bare and they called EMS. In ER she was found to have right femoral neck with varus angulation.  Patient has hx of aortic valve replacement with metronic Valve not on anticoagulation. Patient also has CAD that has been stable. Denies any recent chest pain or shortness of breath. She is able to walk comfortably.    Hospitalist was called for admission for right hip fracture  Review of Systems:    Pertinent positives include:  Hip pain  Constitutional:  No weight loss, night sweats, Fevers, chills, fatigue, weight loss  HEENT:  No headaches, Difficulty swallowing,Tooth/dental problems,Sore throat,  No sneezing, itching, ear ache, nasal congestion, post nasal drip,  Cardio-vascular:  No chest pain, Orthopnea, PND, anasarca, dizziness, palpitations.no Bilateral lower extremity swelling  GI:  No heartburn, indigestion, abdominal pain, nausea, vomiting, diarrhea, change in bowel habits, loss of appetite, melena, blood in stool, hematemesis Resp:  no shortness of breath at rest. No dyspnea on exertion, No excess mucus, no productive cough, No non-productive cough, No coughing up of blood.No change in color of mucus.No wheezing. Skin:  no rash or lesions. No jaundice GU:  no dysuria, change in color of urine, no urgency or frequency. No straining to urinate.  No flank pain.  Musculoskeletal:  No joint pain or no joint swelling. No decreased range of motion. No back pain.  Psych:  No change in mood  or affect. No depression or anxiety. No memory loss.  Neuro: no localizing neurological complaints, no tingling, no weakness, no double vision, no gait abnormality, no slurred speech, no confusion  Otherwise ROS are negative except for above, 10 systems were reviewed  Past Medical History: Past Medical History  Diagnosis Date  . Hyperlipidemia   . Hypertension   . Osteoporosis   . PMR (polymyalgia rheumatica)   . Diabetes mellitus   . Arthritis   . Carotid stenosis, left     50-60% stable   Past Surgical History  Procedure Laterality Date  . Cesarean section    . Appendectomy    . Foot surgery      [name]  . Cardiac valve replacement  04/29/2012     Medications: Prior to Admission medications   Medication Sig Start Date End Date Taking? Authorizing Provider  amLODipine (NORVASC) 5 MG tablet Take 1 tablet (5 mg total) by mouth daily. 07/16/13  Yes Ricard Dillon, MD  aspirin EC 81 MG tablet Take 81 mg by mouth daily.   Yes Historical Provider, MD  bimatoprost (LUMIGAN) 0.01 % SOLN Place 1 drop into both eyes at bedtime.    Yes Historical Provider, MD  Calcium Carb-Cholecalciferol (CALCIUM 500 +D) 500-400 MG-UNIT TABS Take 1 tablet by mouth daily.    Yes Historical Provider, MD  ezetimibe (ZETIA) 10 MG tablet Take 1 tablet (10 mg total) by mouth daily. 06/09/13  Yes Sherren Mocha, MD  irbesartan-hydrochlorothiazide (AVALIDE) 150-12.5 MG per tablet Take 1 tablet by mouth daily. 07/16/13  Yes Ricard Dillon, MD  Multiple Vitamin (MULTIVITAMIN) tablet Take  1 tablet by mouth daily.     Yes Historical Provider, MD  naproxen sodium (ANAPROX) 220 MG tablet Take 220 mg by mouth once.   Yes Historical Provider, MD    Allergies:   Allergies  Allergen Reactions  . Statins Other (See Comments)    Muscle aches    Social History:  Ambulatory independently  Lives at home alone    reports that she has never smoked. She has never used smokeless tobacco. She reports that she drinks  alcohol. She reports that she does not use illicit drugs.    Family History: family history includes COPD in her father; Cancer in her father; Lung cancer in an other family member.    Physical Exam: Patient Vitals for the past 24 hrs:  BP Temp Temp src Pulse Resp SpO2  07/31/13 0517 166/68 mmHg - - 105 18 93 %  07/31/13 0317 - - - - - 96 %  07/31/13 0315 - - - 104 - 88 %  07/31/13 0201 174/57 mmHg 97.9 F (36.6 C) Oral 94 16 94 %    1. General:  in No Acute distress 2. Psychological: Alert and  Oriented 3. Head/ENT:     Dry Mucous Membranes                          Head Non traumatic, neck supple                          Normal Dentition 4. SKIN:   decreased Skin turgor,  Skin clean Dry and intact no rash 5. Heart: Regular rate and rhythm sharp aortic Murmur, Rub or gallop 6. Lungs: Clear to auscultation bilaterally, no wheezes or crackles   7. Abdomen: Soft, non-tender, Non distended 8. Lower extremities: no clubbing, cyanosis, or edema 9. Neurologically Grossly intact, moving all 4 extremities equally 10. MSK: Normal range of motion, diminished in right leg due to pain  body mass index is unknown because there is no weight on file.   Labs on Admission:   Recent Labs  07/31/13 0336  NA 141  K 4.6  CL 104  CO2 20  GLUCOSE 123*  BUN 45*  CREATININE 1.99*  CALCIUM 9.2   No results found for this basename: AST, ALT, ALKPHOS, BILITOT, PROT, ALBUMIN,  in the last 72 hours No results found for this basename: LIPASE, AMYLASE,  in the last 72 hours  Recent Labs  07/31/13 0336  WBC 13.2*  NEUTROABS 11.6*  HGB 11.6*  HCT 33.9*  MCV 89.0  PLT 173   No results found for this basename: CKTOTAL, CKMB, CKMBINDEX, TROPONINI,  in the last 72 hours No results found for this basename: TSH, T4TOTAL, FREET3, T3FREE, THYROIDAB,  in the last 72 hours No results found for this basename: VITAMINB12, FOLATE, FERRITIN, TIBC, IRON, RETICCTPCT,  in the last 72 hours Lab Results   Component Value Date   HGBA1C 6.0 02/22/2013    The CrCl is unknown because both a height and weight (above a minimum accepted value) are required for this calculation. ABG    Component Value Date/Time   PHART 7.394 11/13/2011 1211   HCO3 22.8 11/13/2011 1211   TCO2 22 01/02/2012 0834   ACIDBASEDEF 2.0 11/13/2011 1211   O2SAT 93.0 11/13/2011 1211     No results found for this basename: DDIMER     Other results:  I have pearsonaly reviewed this: ECG REPORT  Rate: 107  Rhythm: LBBB ST&T Change: n/a   UA no evidence of UTI  BNP (last 3 results) No results found for this basename: PROBNP,  in the last 8760 hours  There were no vitals filed for this visit.   Cultures: No results found for this basename: sdes, specrequest, cult, reptstatus     Radiological Exams on Admission: Dg Hip Complete Right  07/31/2013   CLINICAL DATA:  Right upper leg and hip pain with limited weight-bearing after a fall.  EXAM: RIGHT HIP - COMPLETE 2+ VIEW  COMPARISON:  None.  FINDINGS: Subcapital fracture of the right femoral neck with varus angulation of fracture fragments. No dislocation at the hip joint. Degenerative changes in the right hip. Vascular calcifications. Visualized pelvis appears intact.  IMPRESSION: Fracture the right femoral neck with varus angulation.   Electronically Signed   By: Lucienne Capers M.D.   On: 07/31/2013 04:01   Dg Chest Port 1 View  07/31/2013   CLINICAL DATA:  And fall.  Hip fracture.  Right leg pain.  EXAM: PORTABLE CHEST - 1 VIEW  COMPARISON:  DG DEXA AXIAL SKELETON - NRPT [facility] dated 03/17/2013; DG RIBS UNILATERAL*R* dated 12/29/2006  FINDINGS: Shallow inspiration. Borderline heart size and pulmonary vascularity are likely normal for technique. Suggestion of linear atelectasis in the left lung base. Calcification in the mitral valve annulus with stent in the heart. No focal airspace disease. No blunting of costophrenic angles. No pneumothorax. Degenerative changes in the  spine and shoulders.  IMPRESSION: Shallow inspiration with atelectasis in the left lung base.   Electronically Signed   By: Lucienne Capers M.D.   On: 07/31/2013 04:33    Chart has been reviewed  Assessment/Plan  80 yo F w hx of CAD, aortic valve replacement and well controlled DM here with right hip fracture  Present on Admission:  . Fracture of hip, right, closed - overall given advanced age and history of stable coronary artery disease as well as above her disease patient is at least moderate risk. Her coronary artery disease have been stable and she's been stable from her allergic will replace and standpoint. His EKG is unchanged in the left bundle branch block. Patient had not had any recent chest pain or syncope. Perioperatively will start on the beta blocker. We'll obtain troponin. Otherwise no father cardiac workup is indicated at this time.  [name] DIABETES MELLITUS, TYPE II - will write for sliding scale and monitor blood sugars  . HYPERTENSION restart Norvasc tomorrow hold I be started and given renal insufficiency. Give beta blocker IV while n.p.o.  . Aortic valve disorder -  Stable,  aortic position valve no indication for anticoagulation this time from that standpoint. Would let cardiology note patient is here  . CAD (coronary artery disease) stable patient has been doing very well  . CKD (chronic kidney disease), stage III - creatinine somewhat temp increase from baseline of 1.5 which give gentle IV fluids patient have had poor by mouth intake   Prophylaxis: SCD     CODE STATUS:  FULL CODE    Other plan as per orders.  I have spent a total of 55 min on this admission  Kaybree Williams 07/31/2013, 5:44 AM

## 2013-07-31 NOTE — ED Notes (Signed)
Pt states was walking downtown when she tripped on sidewalk, states twisted body and fell on butt, states having R upper leg pain, states unable to put weight on R leg or walk.

## 2013-07-31 NOTE — ED Provider Notes (Signed)
CSN: [id number]     Arrival date & time 07/31/13  0158 History   First MD Initiated Contact with Patient 07/31/13 0245     Chief Complaint  Patient presents with  . Fall  . Leg Pain    right     (Consider location/radiation/quality/duration/timing/severity/associated sxs/prior Treatment) HPI 80 year old female presents to emergency department via EMS from home after a fall.  Patient reports about 3 hours ago she was walking along long the Street.  She reports that she took a bad step off a curb, did a para wet and and landed on her buttocks.  She denies striking her head, no LOC.  Patient reports that after the fall she had pain to her right buttock, and required multiple people to assist her in standing up.  Patient was able to walk with much assistance.  Once getting home, patient was unable to ambulate due to increasing pain.  Patient denies previous history of injury to her right buttock or hip.  Per her prior chart, patient has history of osteo-porosis Past Medical History  Diagnosis Date  . Hyperlipidemia   . Hypertension   . Osteoporosis   . PMR (polymyalgia rheumatica)   . Diabetes mellitus   . Arthritis   . Carotid stenosis, left     50-60% stable   Past Surgical History  Procedure Laterality Date  . Cesarean section    . Appendectomy    . Foot surgery      [name]  . Cardiac valve replacement  04/29/2012   Family History  Problem Relation Age of Onset  . Lung cancer    . COPD Father   . Cancer Father     lung cancer   History  Substance Use Topics  . Smoking status: Never Smoker   . Smokeless tobacco: Never Used  . Alcohol Use: Yes     Comment: very seldom   OB History   Grav Para Term Preterm Abortions TAB SAB Ect Mult Living                 Review of Systems  See History of Present Illness; otherwise all other systems are reviewed and negative   Allergies  Statins  Home Medications   Prior to Admission medications   Medication Sig Start Date  End Date Taking? Authorizing Provider  amLODipine (NORVASC) 5 MG tablet Take 1 tablet (5 mg total) by mouth daily. 07/16/13  Yes Ricard Dillon, MD  aspirin EC 81 MG tablet Take 81 mg by mouth daily.   Yes Historical Provider, MD  bimatoprost (LUMIGAN) 0.01 % SOLN Place 1 drop into both eyes at bedtime.    Yes Historical Provider, MD  Calcium Carb-Cholecalciferol (CALCIUM 500 +D) 500-400 MG-UNIT TABS Take 1 tablet by mouth daily.    Yes Historical Provider, MD  ezetimibe (ZETIA) 10 MG tablet Take 1 tablet (10 mg total) by mouth daily. 06/09/13  Yes Sherren Mocha, MD  irbesartan-hydrochlorothiazide (AVALIDE) 150-12.5 MG per tablet Take 1 tablet by mouth daily. 07/16/13  Yes Ricard Dillon, MD  Multiple Vitamin (MULTIVITAMIN) tablet Take 1 tablet by mouth daily.     Yes Historical Provider, MD  naproxen sodium (ANAPROX) 220 MG tablet Take 220 mg by mouth once.   Yes Historical Provider, MD   BP 174/57  Pulse 104  Temp(Src) 97.9 F (36.6 C) (Oral)  Resp 16  SpO2 96% Physical Exam  Nursing note and vitals reviewed. Constitutional: She is oriented to person, place, and time. She appears  well-developed and well-nourished.  HENT:  Head: Normocephalic and atraumatic.  Nose: Nose normal.  Mouth/Throat: Oropharynx is clear and moist.  Eyes: Conjunctivae and EOM are normal. Pupils are equal, round, and reactive to light.  Neck: Normal range of motion. Neck supple. No JVD present. No tracheal deviation present. No thyromegaly present.  Cardiovascular: Normal rate, regular rhythm, normal heart sounds and intact distal pulses.  Exam reveals no gallop and no friction rub.   No murmur heard. Pulmonary/Chest: Effort normal and breath sounds normal. No stridor. No respiratory distress. She has no wheezes. She has no rales. She exhibits no tenderness.  Abdominal: Soft. Bowel sounds are normal. She exhibits no distension and no mass. There is no tenderness. There is no rebound and no guarding.   Musculoskeletal: Normal range of motion. She exhibits tenderness (patient has significant tenderness to palpation of the right hip and buttock.  She has severe pain with attempted range of motion of the hip.  She is distally neurovascularly intact.  There is no shortening or rotation.). She exhibits no edema.  Lymphadenopathy:    She has no cervical adenopathy.  Neurological: She is alert and oriented to person, place, and time. She has normal reflexes. No cranial nerve deficit. She exhibits normal muscle tone. Coordination normal.  Skin: Skin is dry. No rash noted. No erythema. No pallor.  Psychiatric: She has a normal mood and affect. Her behavior is normal. Judgment and thought content normal.    ED Course  Procedures (including critical care time) Labs Review Labs Reviewed  BASIC METABOLIC PANEL - Abnormal; Notable for the following:    Glucose, Bld 123 (*)    BUN 45 (*)    Creatinine, Ser 1.99 (*)    GFR calc non Af Amer 21 (*)    GFR calc Af Amer 25 (*)    All other components within normal limits  CBC WITH DIFFERENTIAL - Abnormal; Notable for the following:    WBC 13.2 (*)    RBC 3.81 (*)    Hemoglobin 11.6 (*)    HCT 33.9 (*)    Neutrophils Relative % 88 (*)    Lymphocytes Relative 7 (*)    Neutro Abs 11.6 (*)    All other components within normal limits  URINALYSIS, ROUTINE W REFLEX MICROSCOPIC - Abnormal; Notable for the following:    Leukocytes, UA TRACE (*)    All other components within normal limits  PROTIME-INR  URINE MICROSCOPIC-ADD ON  TYPE AND SCREEN  ABO/RH    Imaging Review Dg Hip Complete Right  07/31/2013   CLINICAL DATA:  Right upper leg and hip pain with limited weight-bearing after a fall.  EXAM: RIGHT HIP - COMPLETE 2+ VIEW  COMPARISON:  None.  FINDINGS: Subcapital fracture of the right femoral neck with varus angulation of fracture fragments. No dislocation at the hip joint. Degenerative changes in the right hip. Vascular calcifications. Visualized  pelvis appears intact.  IMPRESSION: Fracture the right femoral neck with varus angulation.   Electronically Signed   By: Lucienne Capers M.D.   On: 07/31/2013 04:01   Dg Chest Port 1 View  07/31/2013   CLINICAL DATA:  And fall.  Hip fracture.  Right leg pain.  EXAM: PORTABLE CHEST - 1 VIEW  COMPARISON:  DG DEXA AXIAL SKELETON - NRPT [facility] dated 03/17/2013; DG RIBS UNILATERAL*R* dated 12/29/2006  FINDINGS: Shallow inspiration. Borderline heart size and pulmonary vascularity are likely normal for technique. Suggestion of linear atelectasis in the left lung base. Calcification in  the mitral valve annulus with stent in the heart. No focal airspace disease. No blunting of costophrenic angles. No pneumothorax. Degenerative changes in the spine and shoulders.  IMPRESSION: Shallow inspiration with atelectasis in the left lung base.   Electronically Signed   By: Lucienne Capers M.D.   On: 07/31/2013 04:33     EKG Interpretation None      MDM   Final diagnoses:  Fracture of femoral neck, right  Renal insufficiency    80 year old female with right hip pain after a fall.  Plan for right hip films.  Differential includes right hip fracture versus pelvic fracture.  Will provide pain medication.  If she has a hip fracture, will start protocol for hip fracture contact or so and hospitalist for depression.    Kalman Drape, MD 07/31/13 [phone]

## 2013-07-31 NOTE — ED Notes (Signed)
Small red marks/bruising noted to lower abdominal area, pt states was not there before fall. Also pt states she is now having spasms in R leg.

## 2013-07-31 NOTE — ED Notes (Signed)
Per EMS pt was downtown about 3 hours ago, fell on her butt, did not hit head, denies LOC, pain to RU leg, pain 9/10 when putting pressure on leg, no shortening or deformity, BP 192/98 now 140/78, HR 90, RR 20. Pt from home.

## 2013-07-31 NOTE — ED Notes (Signed)
Floor RN Benna Dunks concerns about lab not being able to see Troponin orders. This RN called Phlebotomy which stated they see all lab orders.

## 2013-07-31 NOTE — Anesthesia Preprocedure Evaluation (Addendum)
Anesthesia Evaluation  Patient identified by MRN, date of birth, ID band Patient awake    Reviewed: Allergy & Precautions, H&P , NPO status , Patient's Chart, lab work & pertinent test results  History of Anesthesia Complications (+) AWARENESS UNDER ANESTHESIA  Airway Mallampati: II TM Distance: >3 FB Neck ROM: Full    Dental no notable dental hx.    Pulmonary neg pulmonary ROS,  breath sounds clear to auscultation  Pulmonary exam normal       Cardiovascular hypertension, Pt. on medications + CAD and + Peripheral Vascular Disease Rhythm:Regular Rate:Normal + Systolic murmurs 1. Aortic stenosis, now one year s/p [name] (23 Medtronic Core Valve). The patient has New York Heart Association class II symptoms with exertional dyspnea. Overall I think she is stable and will continue her current medical regimen at this point. She has had a one-year followup echocardiogram at [facility] as part of the SURTAVI protocol.  I will see her back in 6 months for follow-up.   EF 55%     Neuro/Psych negative neurological ROS  negative psych ROS   GI/Hepatic negative GI ROS, Neg liver ROS,   Endo/Other  diabetes, Type 2  Renal/GU Renal InsufficiencyRenal disease  negative genitourinary   Musculoskeletal negative musculoskeletal ROS (+)   Abdominal   Peds negative pediatric ROS (+)  Hematology negative hematology ROS (+)   Anesthesia Other Findings   Reproductive/Obstetrics negative OB ROS                      Anesthesia Physical Anesthesia Plan  ASA: III  Anesthesia Plan: Spinal   Post-op Pain Management:    Induction: Intravenous  Airway Management Planned: Simple Face Mask  Additional Equipment:   Intra-op Plan:   Post-operative Plan:   Informed Consent: I have reviewed the patients History and Physical, chart, labs and discussed the procedure including the risks, benefits and  alternatives for the proposed anesthesia with the patient or authorized representative who has indicated his/her understanding and acceptance.   Dental advisory given  Plan Discussed with: CRNA and Surgeon  Anesthesia Plan Comments:        Anesthesia Quick Evaluation

## 2013-07-31 NOTE — ED Notes (Signed)
Orthopedic at bedside

## 2013-07-31 NOTE — Op Note (Signed)
NAME:  Sara Garza, Sara Garza NO.:  [id number]   MEDICAL RECORD NO.: [id number]   LOCATION:  [id number]                         FACILITY:  [facility] BIRTH:  02/04/1924  PHYSICIAN:  Pietro Cassis. Alvan Dame, M.D.     DATE OF PROCEDURE:  5/215                               OPERATIVE REPORT     PREOPERATIVE DIAGNOSIS:  Right displaced femoral neck fracture.   POSTOPERATIVE DIAGNOSIS:  Right displaced femoral neck fracture.   PROCEDURE:  Right hip hemiarthroplasty utilizing DePuy component, size 4 Summit Basic stem with a 42mm unipolar ball with a +0 adapter.   SURGEON:  Pietro Cassis. Alvan Dame, MD   ASSISTANT:  Justice Britain, MD   ANESTHESIA:  General.   SPECIMENS:  None.   DRAINS:  None.   BLOOD LOSS:  About 150 cc.   COMPLICATIONS:  None.   INDICATION OF PROCEDURE:  Ms. Tapani is a pleasant 80 year old female who unfortunately had a fall while walking around last night.  She had immediate pain, inability to bear weight.  She lives independently.  She was admitted to the hospital after radiographs revealed a femoral neck fracture.  She was seen and evaluated and was scheduled for surgery for fixation.  The necessity of surgical repair was discussed with she and her family.  Consent was obtained after reviewing risks of infection, DVT, component failure, and need for revision surgery.   PROCEDURE IN DETAIL:  The patient was brought to the operative theater. Once adequate anesthesia, preoperative antibiotics, 2 g of Ancef administered, the patient was positioned into the left lateral decubitus position with the right side up.  The right lower extremity was then prepped and draped in sterile fashion.  A time-out was performed identifying the patient, planned procedure, and extremity.   A lateral incision was made off the proximal trochanter. Sharp dissection was carried down to the iliotibial band and gluteal fascia. The gluteal fascia was then incised for posterior approach.   The short external rotators were taken down separate from the posterior capsule. An L capsulotomy was made preserving the posterior leaflet for later anatomic repair. Fracture site was identified and after removing comminuted segments of the posterior femoral neck, the femoral head was removed without difficulty and measured on the back table  using the sizing rings and determined to be 43 mm in diameter.   The proximal femur was then exposed.  Retractors placed.  I then drilled, opened the proximal femur.  Then I hand reamed once and  Irrigated the canal to try to prevent fat emboli.  I began broaching the femur with a starting #1 broach up to a size 4 broach with good medial and lateral metaphyseal fit without evidence of any torsion or movement.  A trial reduction was carried out with a standard neck and a +0 adapter with a 67mm head ball.  The hip reduced nicely.  The leg lengths appeared to be equal compared to the down leg.   The hip went through a range of motion without evidence of any subluxation or impingement.   Given these findings, the trial components removed.  The final 4 standard  Summit Basic  stem was opened.  After irrigating the canal, the final stem was impacted and sat at the level where the broach was. Based on this and the trial reduction, a +0 adapter was opened and impacted in the 44mm unipolar ball onto a clean and dry trunnion.  The hip had been irrigated throughout the case and again at this point.  I re- Approximated the posterior capsule to the superior leaflet using a  #1 Vicryl,  and placed a medium Hemovac drain deep.  The remainder of the wound was closed with #1 Vicryl in the iliotibial band and gluteal fascia, a  2-0 Vicryl in the sub-Q tissue and a running 4-0 Monocryl in the skin.  The hip was cleaned, dried, and dressed sterilely using Dermabond and Aquacel dressing.  Drain site was dressed separately.  She was then brought to recovery room,  extubated in stable condition, tolerating the procedure well.  Justice Britain, MD was present and utilized as assistant for the entire case from  Preoperative positioning to management of the contralateral extremity and retractors to  General facilitation of the procedure.  He was also involved with primary wound closure.         Pietro Cassis Alvan Dame, M.D.

## 2013-07-31 NOTE — Progress Notes (Signed)
I have seen and examined Sara Garza at bedside in the presence of her family, including her daughter and reviewed her chart. Appreciate orthopedics. Patient fell resulting in hip fracture which has been fixed by orthopedics today. She also has suggestion of left lung atelectasis/pna with leukocytosis of 13,000. I have therefore added Levaquin. Will continue rest of management per Dr. Peri Jefferson

## 2013-07-31 NOTE — ED Notes (Signed)
Multiple people assist to move pt over from stretcher to hospital bed until room assignment available.

## 2013-07-31 NOTE — ED Notes (Signed)
Patient transported to X-ray 

## 2013-07-31 NOTE — Anesthesia Procedure Notes (Signed)

## 2013-07-31 NOTE — Transfer of Care (Signed)
Immediate Anesthesia Transfer of Care Note  Patient: Sara Garza  Procedure(s) Performed: Procedure(s): ARTHROPLASTY BIPOLAR HIP (Right)  Patient Location: PACU  Anesthesia Type:MAC and Spinal  Level of Consciousness: awake, alert , oriented and patient cooperative  Airway & Oxygen Therapy: Patient Spontanous Breathing and Patient connected to face mask oxygen  Post-op Assessment: Report given to PACU RN and Post -op Vital signs reviewed and stable  Post vital signs: Reviewed and stable  Complications: No apparent anesthesia complications

## 2013-07-31 NOTE — Discharge Instructions (Signed)
Partial weight bearing right lower extremity until further directed  Maintain surgical dressing until follow up appointment at which point it will be removed  May shower when wanted

## 2013-07-31 NOTE — ED Notes (Signed)
Bed: [id number] Expected date:  Expected time:  Means of arrival:  Comments: Bed 25, EMS, 28 F, Fall

## 2013-08-01 DIAGNOSIS — N183 Chronic kidney disease, stage 3 unspecified: Secondary | ICD-10-CM

## 2013-08-01 LAB — GLUCOSE, CAPILLARY
GLUCOSE-CAPILLARY: 100 mg/dL — AB (ref 70–99)
GLUCOSE-CAPILLARY: 104 mg/dL — AB (ref 70–99)
Glucose-Capillary: 113 mg/dL — ABNORMAL HIGH (ref 70–99)
Glucose-Capillary: 83 mg/dL (ref 70–99)
Glucose-Capillary: 91 mg/dL (ref 70–99)
Glucose-Capillary: 99 mg/dL (ref 70–99)

## 2013-08-01 LAB — CBC
HEMATOCRIT: 28.1 % — AB (ref 36.0–46.0)
Hemoglobin: 9.6 g/dL — ABNORMAL LOW (ref 12.0–15.0)
MCH: 30.8 pg (ref 26.0–34.0)
MCHC: 34.2 g/dL (ref 30.0–36.0)
MCV: 90.1 fL (ref 78.0–100.0)
PLATELETS: 127 10*3/uL — AB (ref 150–400)
RBC: 3.12 MIL/uL — ABNORMAL LOW (ref 3.87–5.11)
RDW: 13.2 % (ref 11.5–15.5)
WBC: 7.1 10*3/uL (ref 4.0–10.5)

## 2013-08-01 LAB — BASIC METABOLIC PANEL
BUN: 39 mg/dL — AB (ref 6–23)
CALCIUM: 8.5 mg/dL (ref 8.4–10.5)
CHLORIDE: 106 meq/L (ref 96–112)
CO2: 23 mEq/L (ref 19–32)
Creatinine, Ser: 1.47 mg/dL — ABNORMAL HIGH (ref 0.50–1.10)
GFR calc Af Amer: 36 mL/min — ABNORMAL LOW (ref >90)
GFR, EST NON AFRICAN AMERICAN: 31 mL/min — AB (ref >90)
Glucose, Bld: 109 mg/dL — ABNORMAL HIGH (ref 70–99)
Potassium: 5.2 mEq/L (ref 3.7–5.3)
Sodium: 140 mEq/L (ref 137–147)

## 2013-08-01 MED ORDER — INSULIN ASPART 100 UNIT/ML ~~LOC~~ SOLN
0.0000 [IU] | Freq: Three times a day (TID) | SUBCUTANEOUS | Status: DC
  Administered 2013-08-02: 1 [IU] via SUBCUTANEOUS

## 2013-08-01 NOTE — Progress Notes (Signed)
[facility] PROGRESS NOTE  [place] [id number] DOB: 1923-06-03 DOA: 07/31/2013 PCP: Georgetta Haber, MD  Assessment/Plan: Fracture of hip, right -s/p Right hip hemiarthroplasty 5/2 -POD #1 -PT/OT -Lovenox for DVT proph: would DC to SNF on this for 2-3weeks if ok with Ortho  . DIABETES MELLITUS, TYPE II  -stable, SSI   . HYPERTENSION  -stable, resume norvasc   . Aortic valve disorder -s/p AVR 1/14 -follow up with Dr.Cooper  . CAD (coronary artery disease)  -stable  CKD (chronic kidney disease), stage III  -improved with hydration, at baseline now -stop IVF  Atelectasis -doubt pneumonia, completely asymptomatic - do not think Abx warranted at this time -will stop levaquin -IS Q1hour while awake  Prophylaxis: lovenox  Code: Full Code Family Communication: none at bedside DIsposition Plan: SNF in 1-2days  Consultants: Ortho  Procedure: Right hip hemiarthroplasty 5/2   Antibiotics: levaquin 5/2-5/3 HPI/Subjective: feels well, less nauseous today,  No cough, congestion, fever, dyspnea  Objective: Filed Vitals:   08/01/13 0800  BP:   Pulse:   Temp:   Resp: 18    Intake/Output Summary (Last 24 hours) at 08/01/13 1016 Last data filed at 08/01/13 0700  Gross per 24 hour  Intake 1320.83 ml  Output    800 ml  Net 520.83 ml   Filed Weights   07/31/13 1130  Weight: 72.576 kg (160 lb)    Exam:   General: AAOx3, no distress  Cardiovascular: S1S2/RRR  Respiratory: CTAB  Abdomen: soft, Nt, Bs present  Ext: no edema c/c  R hip with dressing CDI   Data Reviewed: Basic Metabolic Panel:  Recent Labs Lab 07/31/13 0336 07/31/13 1010 08/01/13 0558  NA 141  --  140  K 4.6  --  5.2  CL 104  --  106  CO2 20  --  23  GLUCOSE 123*  --  109*  BUN 45*  --  39*  CREATININE 1.99*  --  1.47*  CALCIUM 9.2 8.6 8.5   Liver Function Tests:  Recent Labs Lab 07/31/13 1010  ALBUMIN 3.3*   No results found for this basename:  LIPASE, AMYLASE,  in the last 168 hours No results found for this basename: AMMONIA,  in the last 168 hours CBC:  Recent Labs Lab 07/31/13 0336 08/01/13 0558  WBC 13.2* 7.1  NEUTROABS 11.6*  --   HGB 11.6* 9.6*  HCT 33.9* 28.1*  MCV 89.0 90.1  PLT 173 127*   Cardiac Enzymes:  Recent Labs Lab 07/31/13 1010  TROPONINI <0.30   BNP (last 3 results) No results found for this basename: PROBNP,  in the last 8760 hours CBG:  Recent Labs Lab 07/31/13 1737 07/31/13 2045 07/31/13 2347 08/01/13 0441 08/01/13 0745  GLUCAP 101* 151* 104* 100* 113*    No results found for this or any previous visit (from the past 240 hour(s)).   Studies: Dg Hip Complete Right  07/31/2013   CLINICAL DATA:  Right upper leg and hip pain with limited weight-bearing after a fall.  EXAM: RIGHT HIP - COMPLETE 2+ VIEW  COMPARISON:  None.  FINDINGS: Subcapital fracture of the right femoral neck with varus angulation of fracture fragments. No dislocation at the hip joint. Degenerative changes in the right hip. Vascular calcifications. Visualized pelvis appears intact.  IMPRESSION: Fracture the right femoral neck with varus angulation.   Electronically Signed   By: Lucienne Capers M.D.   On: 07/31/2013 04:01   Dg Pelvis Portable  07/31/2013   CLINICAL DATA:  Postop right hip arthroplasty  EXAM: PORTABLE PELVIS 1-2 VIEWS  COMPARISON:  None.  FINDINGS: Postsurgical change from right hip arthroplasty identified. The hardware components are in anatomic alignment and no complications identified. No periprosthetic fracture or dislocation.  IMPRESSION: 1. No complications status post right hip arthroplasty.   Electronically Signed   By: Kerby Moors M.D.   On: 07/31/2013 15:35   Dg Chest Port 1 View  07/31/2013   CLINICAL DATA:  And fall.  Hip fracture.  Right leg pain.  EXAM: PORTABLE CHEST - 1 VIEW  COMPARISON:  DG DEXA AXIAL SKELETON - NRPT [facility] dated 03/17/2013; DG RIBS UNILATERAL*R* dated 12/29/2006  FINDINGS:  Shallow inspiration. Borderline heart size and pulmonary vascularity are likely normal for technique. Suggestion of linear atelectasis in the left lung base. Calcification in the mitral valve annulus with stent in the heart. No focal airspace disease. No blunting of costophrenic angles. No pneumothorax. Degenerative changes in the spine and shoulders.  IMPRESSION: Shallow inspiration with atelectasis in the left lung base.   Electronically Signed   By: Lucienne Capers M.D.   On: 07/31/2013 04:33    Scheduled Meds: . amLODipine  5 mg Oral Daily  . docusate sodium  100 mg Oral BID  . enoxaparin (LOVENOX) injection  40 mg Subcutaneous Q24H  . ezetimibe  10 mg Oral Daily  . ferrous sulfate  325 mg Oral TID PC  . insulin aspart  0-9 Units Subcutaneous 6 times per day  . latanoprost  1 drop Both Eyes QHS  . [START ON 08/02/2013] levofloxacin (LEVAQUIN) IV  500 mg Intravenous Q48H  . metoprolol  2.5 mg Intravenous Q12H  . pantoprazole  40 mg Oral Daily   Continuous Infusions: . sodium chloride 0.9 % 1,000 mL with potassium chloride 10 mEq infusion 50 mL/hr at 07/31/13 1935   Antibiotics Given (last 72 hours)   Date/Time Action Medication Dose Rate   07/31/13 1355 Given   ceFAZolin (ANCEF) IVPB 2 g/50 mL premix 2 g    07/31/13 1835 Given   levofloxacin (LEVAQUIN) IVPB 750 mg 750 mg 100 mL/hr   07/31/13 1935 Given   ceFAZolin (ANCEF) IVPB 2 g/50 mL premix 2 g 100 mL/hr   08/01/13 0151 Given   ceFAZolin (ANCEF) IVPB 2 g/50 mL premix 2 g 100 mL/hr      Active Problems:   DIABETES MELLITUS, TYPE II   HYPERTENSION   Fracture of hip, right, closed   Aortic valve disorder   CAD (coronary artery disease)   CKD (chronic kidney disease), stage III   Closed right hip fracture   Left bundle branch block    Time spent: 23min    [place] [facility] Pager [phone]. If 7PM-7AM, please contact night-coverage at [email or web address], password [facility] 08/01/2013, 10:16 AM  LOS: 1 day

## 2013-08-01 NOTE — Evaluation (Signed)
Physical Therapy Evaluation Patient Details Name: Sara Garza MRN: [id number] DOB: [birth year] Today's Date: 08/01/2013   History of Present Illness     Clinical Impression  Pt s/p R hip fx with hemi-arthroplasty presents with decreased R LE strength/ROM, post op pain, PWB status and posterior THP limiting functional mobility.  Pt would benefit from follow up rehab at SNF level to maximize IND and safety prior to return home with ltd assist.    Follow Up Recommendations SNF    Equipment Recommendations  Rolling walker with 5" wheels    Recommendations for Other Services OT consult     Precautions / Restrictions Precautions Precautions: Posterior Hip;Fall Precaution Booklet Issued: Yes (comment) Precaution Comments: Sign hung in room with Post THP and PWB Restrictions Weight Bearing Restrictions: Yes RLE Weight Bearing: Partial weight bearing RLE Partial Weight Bearing Percentage or Pounds: 50%      Mobility  Bed Mobility Overal bed mobility: +2 for physical assistance;+ 2 for safety/equipment;Needs Assistance Bed Mobility: Supine to Sit     Supine to sit: Mod assist;+2 for physical assistance     General bed mobility comments: cues for sequence and use of L LE to self assist.  Physical assist to manage R LE, bring pt to EOB on drop pad and bring trunk to upright  Transfers Overall transfer level: Needs assistance Equipment used: Rolling walker (2 wheeled) Transfers: Sit to/from Stand Sit to Stand: Mod assist;+2 physical assistance         General transfer comment: cues for LE management, use of UEs to self assist and adherence to all precautions  Ambulation/Gait Ambulation/Gait assistance: Min assist;Mod assist;+2 physical assistance Ambulation Distance (Feet): 14 Feet Assistive device: Rolling walker (2 wheeled) Gait Pattern/deviations: Step-to pattern;Decreased step length - right;Decreased step length - left;Shuffle;Trunk flexed Gait velocity: decr    General Gait Details: Cues for posture, sequence, position from RW, to increase BOS and to increase UE WB   Stairs            Wheelchair Mobility    Modified Rankin (Stroke Patients Only)       Balance                                             Pertinent Vitals/Pain 4/10; premed, ice pack provided    Home Living Family/patient expects to be discharged to:: Skilled nursing facility                      Prior Function Level of Independence: Independent               Hand Dominance   Dominant Hand: Right    Extremity/Trunk Assessment   Upper Extremity Assessment: Overall WFL for tasks assessed           Lower Extremity Assessment: RLE deficits/detail RLE Deficits / Details: Hip strength 2/5 with AAROM to 80 flex and 15 abd       Communication   Communication: No difficulties  Cognition Arousal/Alertness: Awake/alert Behavior During Therapy: WFL for tasks assessed/performed Overall Cognitive Status: Within Functional Limits for tasks assessed                      General Comments      Exercises Total Joint Exercises Ankle Circles/Pumps: AROM;Both;15 reps;Supine Quad Sets: AROM;Both;10 reps;Supine Heel Slides: AAROM;15 reps;Supine;Right Hip ABduction/ADduction: AAROM;Right;10 reps;Supine  Assessment/Plan    PT Assessment Patient needs continued PT services  PT Diagnosis Difficulty walking   PT Problem List Decreased strength;Decreased range of motion;Decreased activity tolerance;Decreased mobility;Decreased knowledge of use of DME;Pain;Obesity;Decreased knowledge of precautions  PT Treatment Interventions DME instruction;Gait training;Stair training;Functional mobility training;Therapeutic activities;Therapeutic exercise;Patient/family education   PT Goals (Current goals can be found in the Care Plan section) Acute Rehab PT Goals Patient Stated Goal: Regain IND PT Goal Formulation: With  patient Time For Goal Achievement: 08/08/13 Potential to Achieve Goals: Good    Frequency Min 5X/week   Barriers to discharge        Co-evaluation               End of Session Equipment Utilized During Treatment: Gait belt Activity Tolerance: Patient tolerated treatment well Patient left: in chair;with call bell/phone within reach Nurse Communication: Mobility status         Time: 1012-1100 PT Time Calculation (min): 48 min   Charges:   PT Evaluation $Initial PT Evaluation Tier I: 1 Procedure PT Treatments $Gait Training: 8-22 mins $Therapeutic Exercise: 8-22 mins $Therapeutic Activity: 8-22 mins   PT G Codes:          Mathis Fare 08/01/2013, 12:18 PM

## 2013-08-01 NOTE — Progress Notes (Signed)
Subjective: 1 Day Post-Op Procedure(s) (LRB): ARTHROPLASTY BIPOLAR HIP (Right) Patient reports pain as mild.  Soreness to Right hip. Tolertaing PO's well. No N/V. Denies sOB, CP, or calf pain.  Objective: Vital signs in last 24 hours: Temp:  [97.4 F (36.3 C)-99 F (37.2 C)] 97.8 F (36.6 C) (05/03 0445) Pulse Rate:  [71-115] 85 (05/03 0445) Resp:  [11-20] 18 (05/03 0445) BP: (96-153)/(33-69) 134/54 mmHg (05/03 0445) SpO2:  [93 %-100 %] 96 % (05/03 0445) Weight:  [72.576 kg (160 lb)] 72.576 kg (160 lb) (05/02 1130)  Intake/Output from previous day: 05/02 0701 - 05/03 0700 In: 1320.8 [I.V.:1220.8; IV Piggyback:100] Out: 800 [Urine:650; Blood:150] Intake/Output this shift:     Recent Labs  07/31/13 0336 08/01/13 0558  HGB 11.6* 9.6*    Recent Labs  07/31/13 0336 08/01/13 0558  WBC 13.2* 7.1  RBC 3.81* 3.12*  HCT 33.9* 28.1*  PLT 173 127*    Recent Labs  07/31/13 0336 07/31/13 1010 08/01/13 0558  NA 141  --  140  K 4.6  --  5.2  CL 104  --  106  CO2 20  --  23  BUN 45*  --  39*  CREATININE 1.99*  --  1.47*  GLUCOSE 123*  --  109*  CALCIUM 9.2 8.6 8.5    Recent Labs  07/31/13 0336  INR 0.99    A&O x3. Right hip dressing C/D/I. RLE N/V.   Assessment/Plan: 1 Day Post-Op Procedure(s) (LRB): ARTHROPLASTY BIPOLAR HIP (Right) Up with PT RLE Partial WB. Plan to D/c to SNF first of the week. Continue current care.  Sara Garza [name] 08/01/2013, 8:23 AM

## 2013-08-01 NOTE — Progress Notes (Signed)
OT Cancellation Note  Patient Details Name: Sara Garza MRN: [id number] DOB: 04-30-1923   Cancelled Treatment:    Reason Eval/Treat Not Completed: OT screened, no needs identified, will sign off. Note plan is for SNF and will defer OT eval to SNF. Please contact if eval needed. Thanks.  Alycia Patten Nadia Torr [phone] 08/01/2013, 11:11 AM

## 2013-08-01 NOTE — Progress Notes (Signed)
Physical Therapy Treatment Patient Details Name: Sara Garza MRN: [id number] DOB: 04/19/1923 Today's Date: 08/01/2013    History of Present Illness      PT Comments    Pt continues motivated but with increased anxiety regarding ability to maintain PWB and follow post THP.    Follow Up Recommendations  SNF     Equipment Recommendations  Rolling walker with 5" wheels    Recommendations for Other Services OT consult     Precautions / Restrictions Precautions Precautions: Posterior Hip;Fall Precaution Booklet Issued: Yes (comment) Precaution Comments: Pt able to recall 1 THP - all precautions reviewed several times and additional hand out provided Restrictions Weight Bearing Restrictions: Yes RLE Weight Bearing: Partial weight bearing RLE Partial Weight Bearing Percentage or Pounds: 50%    Mobility  Bed Mobility Overal bed mobility: +2 for physical assistance;+ 2 for safety/equipment;Needs Assistance Bed Mobility: Sit to Supine     Supine to sit: Mod assist;+2 for physical assistance Sit to supine: Mod assist;+2 for physical assistance   General bed mobility comments: cues for sequence and use of L LE to self assist  Transfers Overall transfer level: Needs assistance Equipment used: Rolling walker (2 wheeled) Transfers: Sit to/from Stand Sit to Stand: Mod assist;+2 physical assistance         General transfer comment: cues for LE management, use of UEs to self assist and adherence to all precautions  Ambulation/Gait Ambulation/Gait assistance: Mod assist;+2 physical assistance Ambulation Distance (Feet): 12 Feet Assistive device: Rolling walker (2 wheeled) Gait Pattern/deviations: Step-to pattern;Decreased step length - right;Decreased step length - left;Shuffle;Antalgic;Trunk flexed Gait velocity: decr   General Gait Details: Cues for posture, sequence, position from RW, and to increase UE WB    Stairs            Wheelchair Mobility    Modified  Rankin (Stroke Patients Only)       Balance                                    Cognition Arousal/Alertness: Awake/alert Behavior During Therapy: WFL for tasks assessed/performed Overall Cognitive Status: Within Functional Limits for tasks assessed                      Exercises Total Joint Exercises Ankle Circles/Pumps: AROM;Both;15 reps;Supine Quad Sets: AROM;Both;10 reps;Supine Heel Slides: AAROM;15 reps;Supine;Right Hip ABduction/ADduction: AAROM;Right;10 reps;Supine    General Comments        Pertinent Vitals/Pain 4/10; ice packs provided    Home Living Family/patient expects to be discharged to:: Skilled nursing facility                    Prior Function Level of Independence: Independent          PT Goals (current goals can now be found in the care plan section) Acute Rehab PT Goals Patient Stated Goal: Regain IND PT Goal Formulation: With patient Time For Goal Achievement: 08/08/13 Potential to Achieve Goals: Good Progress towards PT goals: Progressing toward goals    Frequency  Min 5X/week    PT Plan Current plan remains appropriate    Co-evaluation             End of Session Equipment Utilized During Treatment: Gait belt Activity Tolerance: Patient tolerated treatment well Patient left: in bed;with call bell/phone within reach;with family/visitor present     Time: [id number] PT Time Calculation (min): 34  min  Charges:  $Gait Training: 8-22 mins $Therapeutic Exercise: 8-22 mins $Therapeutic Activity: 8-22 mins                    G Codes:      Mathis Fare 08-22-13, 1:51 PM

## 2013-08-01 NOTE — Progress Notes (Addendum)
Clinical Social Work Department CLINICAL SOCIAL WORK PLACEMENT NOTE 08/01/2013  Patient:  Sara Garza, Sara Garza  Account Number:  [id number] Admit date:  07/31/2013  Clinical Social Worker:  Levie Heritage  Date/time:  08/01/2013 11:59 AM  Clinical Social Work is seeking post-discharge placement for this patient at the following level of care:   [place]   (*CSW will update this form in Epic as items are completed)   08/01/2013  Patient/family provided with [facility] Department of Clinical Social Work's list of facilities offering this level of care within the geographic area requested by the patient (or if unable, by the patient's family).  08/01/2013  Patient/family informed of their freedom to choose among providers that offer the needed level of care, that participate in Medicare, Medicaid or managed care program needed by the patient, have an available bed and are willing to accept the patient.  08/01/2013  Patient/family informed of [facility]' ownership interest in [facility], as well as of the fact that they are under no obligation to receive care at this facility.  PASARR submitted to EDS on 08/02/13 PASARR number received from [employer] on 08/02/13-([phone])  FL2 transmitted to all facilities in geographic area requested by pt/family on  08/01/2013 FL2 transmitted to all facilities within larger geographic area on   Patient informed that his/her managed care company has contracts with or will negotiate with  certain facilities, including the following:     Patient/family informed of bed offers received:   Patient chooses bed at  Physician recommends and patient chooses bed at    Patient to be transferred to  on   Patient to be transferred to facility by   The following physician request were entered in Epic:   Additional Comments:  Bernita Raisin, [place] [facility] [phone]

## 2013-08-01 NOTE — Progress Notes (Signed)
Clinical Social Work Department BRIEF PSYCHOSOCIAL ASSESSMENT 08/01/2013  Patient:  Sara Garza, Sara Garza     Account Number:  [id number]     Admit date:  07/31/2013  Clinical Social Worker:  Levie Heritage  Date/Time:  08/01/2013 11:55 AM  Referred by:  Physician  Date Referred:  08/01/2013 Referred for  SNF Placement   Other Referral:   Interview type:  Patient Other interview type:    PSYCHOSOCIAL DATA Living Status:  ALONE Admitted from facility:   Level of care:   Primary support name:  [name] Primary support relationship to patient:  CHILD, ADULT Degree of support available:   strong    CURRENT CONCERNS Current Concerns  Post-Acute Placement   Other Concerns:    SOCIAL WORK ASSESSMENT / PLAN Met with Pt to discuss d/c plans.    Pt stated that she feels that rehab is necessary, as she lives alone and she knows that she'll need more care than her daughter can provide for her.    Pt gave CSW permission to begin the SNF search.    CSW provided Pt with a SNF list.    CSW thanked Pt for her time.   Assessment/plan status:  Psychosocial Support/Ongoing Assessment of Needs Other assessment/ plan:   Information/referral to community resources:   SNF list    PATIENT'S/FAMILY'S RESPONSE TO PLAN OF CARE: Pt was calm, cooperative and very pleasant.  Pt stated that she's been to rehab in the past and so she knows what to expect.  Pt stated that she's happy to go to rehab, short-term, and then will be even happier to go home.   Bernita Raisin, [place] [facility] [phone]

## 2013-08-02 LAB — GLUCOSE, CAPILLARY
GLUCOSE-CAPILLARY: 91 mg/dL (ref 70–99)
GLUCOSE-CAPILLARY: 132 mg/dL — AB (ref 70–99)
GLUCOSE-CAPILLARY: 127 mg/dL — AB (ref 70–99)
Glucose-Capillary: 88 mg/dL (ref 70–99)

## 2013-08-02 LAB — BASIC METABOLIC PANEL
BUN: 33 mg/dL — AB (ref 6–23)
CHLORIDE: 106 meq/L (ref 96–112)
CO2: 23 mEq/L (ref 19–32)
Calcium: 8.3 mg/dL — ABNORMAL LOW (ref 8.4–10.5)
Creatinine, Ser: 1.5 mg/dL — ABNORMAL HIGH (ref 0.50–1.10)
GFR, EST AFRICAN AMERICAN: 35 mL/min — AB (ref >90)
GFR, EST NON AFRICAN AMERICAN: 30 mL/min — AB (ref >90)
Glucose, Bld: 94 mg/dL (ref 70–99)
Potassium: 4.8 mEq/L (ref 3.7–5.3)
Sodium: 138 mEq/L (ref 137–147)

## 2013-08-02 LAB — VITAMIN D 25 HYDROXY (VIT D DEFICIENCY, FRACTURES): Vit D, 25-Hydroxy: 38 ng/mL (ref 30–89)

## 2013-08-02 LAB — CBC
HEMATOCRIT: 26.3 % — AB (ref 36.0–46.0)
Hemoglobin: 8.7 g/dL — ABNORMAL LOW (ref 12.0–15.0)
MCH: 30.3 pg (ref 26.0–34.0)
MCHC: 33.1 g/dL (ref 30.0–36.0)
MCV: 91.6 fL (ref 78.0–100.0)
Platelets: 105 10*3/uL — ABNORMAL LOW (ref 150–400)
RBC: 2.87 MIL/uL — ABNORMAL LOW (ref 3.87–5.11)
RDW: 13.2 % (ref 11.5–15.5)
WBC: 5.4 10*3/uL (ref 4.0–10.5)

## 2013-08-02 MED ORDER — ENOXAPARIN SODIUM 30 MG/0.3ML ~~LOC~~ SOLN
30.0000 mg | Freq: Every day | SUBCUTANEOUS | Status: DC
  Administered 2013-08-02 – 2013-08-03 (×2): 30 mg via SUBCUTANEOUS
  Filled 2013-08-02 (×2): qty 0.3

## 2013-08-02 NOTE — Progress Notes (Signed)
Clinical Social Work  CSW met with patient, friend, and dtr at bedside. Patient and family have been reviewing offers and are deciding between [facility] and [facility]. Dtr to tour facilities and will alert CSW when they have chose. CSW updated both SNFs that patient is possibly interested. CSW will continue to follow.  [place], [place] [phone]

## 2013-08-02 NOTE — Progress Notes (Signed)
Physical Therapy Treatment Patient Details Name: Sara Garza MRN: [id number] DOB: 1923/11/05 Today's Date: 08/02/2013    History of Present Illness fall, s/p R hip hemiarthroplasty    PT Comments    Pt is progressing well.  Follow Up Recommendations  SNF     Equipment Recommendations  Rolling walker with 5" wheels    Recommendations for Other Services       Precautions / Restrictions Precautions Precautions: Posterior Hip;Fall Precaution Booklet Issued: Yes (comment) Precaution Comments: Pt able to recall 1 THP - all precautions reviewed several times and additional hand out provided Restrictions RLE Weight Bearing: Partial weight bearing RLE Partial Weight Bearing Percentage or Pounds: 50%    Mobility  Bed Mobility Overal bed mobility: +2 for physical assistance;+ 2 for safety/equipment;Needs Assistance Bed Mobility: Sit to Supine     Supine to sit: Mod assist;+2 for physical assistance Sit to supine: Mod assist   General bed mobility comments: cues for position on bed to facilitate getting legs onto bed, assistance forR leg onto bed.  Transfers Overall transfer level: Needs assistance Equipment used: Rolling walker (2 wheeled) Transfers: Sit to/from [facility] Sit to Stand: Mod assist         General transfer comment: cues for LE management, use of UEs to self assist and adherence to all precautions, cues to push from arm rests of recliner and BSC. Pt pivoted from recliner to [facility] then took several steps backward to  the bed, cues for sequence.  Ambulation/Gait Ambulation/Gait assistance: +2 safety/equipment;Mod assist Ambulation Distance (Feet): 15 Feet Assistive device: Rolling walker (2 wheeled) Gait Pattern/deviations: Step-to pattern;Decreased step length - right Gait velocity: decr   General Gait Details: Cues for posture, sequence, position from RW, and to increase UE WB    Stairs            Wheelchair Mobility     Modified Rankin (Stroke Patients Only)       Balance                                    Cognition Arousal/Alertness: Awake/alert Behavior During Therapy: WFL for tasks assessed/performed Overall Cognitive Status: Within Functional Limits for tasks assessed                      Exercises      General Comments        Pertinent Vitals/Pain Only when  Moving. Ice applied.    Home Living                      Prior Function            PT Goals (current goals can now be found in the care plan section) Progress towards PT goals: Progressing toward goals    Frequency  Min 3X/week    PT Plan Current plan remains appropriate    Co-evaluation             End of Session Equipment Utilized During Treatment: Gait belt Activity Tolerance: Patient tolerated treatment well Patient left: in bed;with call bell/phone within reach     Time: [id number] PT Time Calculation (min): 33 min  Charges:  $Gait Training: 8-22 mins $Therapeutic Activity: 8-22 mins $Self Care/Home Management: 8-22                    G Codes:  Shella Maxim [facility] 08/02/2013, 3:04 PM

## 2013-08-02 NOTE — Progress Notes (Signed)
Physical Therapy Treatment Patient Details Name: Sara Garza MRN: [id number] DOB: 1923-11-27 Today's Date: 08/02/2013    History of Present Illness fall, s/p R hip hemiarthroplasty    PT Comments    Pt tolerated ambulation very well, plans for [facility].  Follow Up Recommendations  SNF     Equipment Recommendations  Rolling walker with 5" wheels    Recommendations for Other Services       Precautions / Restrictions Precautions Precautions: Posterior Hip;Fall Precaution Comments: Pt able to recall 1 THP - all precautions reviewed several times and additional hand out provided Restrictions RLE Weight Bearing: Partial weight bearing RLE Partial Weight Bearing Percentage or Pounds: 50%    Mobility  Bed Mobility Overal bed mobility: +2 for physical assistance;+ 2 for safety/equipment;Needs Assistance Bed Mobility: Supine to Sit     Supine to sit: Mod assist;+2 for physical assistance Sit to supine: HOB elevated   General bed mobility comments: cues for sequence and use of L LE to self assist, use of rail  Transfers Overall transfer level: Needs assistance Equipment used: Rolling walker (2 wheeled) Transfers: Sit to/from Stand Sit to Stand: +2 physical assistance;Mod assist         General transfer comment: cues for LE management, use of UEs to self assist and adherence to all precautions  Ambulation/Gait Ambulation/Gait assistance: +2 safety/equipment;Mod assist Ambulation Distance (Feet): 15 Feet Assistive device: Rolling walker (2 wheeled) Gait Pattern/deviations: Step-to pattern;Decreased step length - right Gait velocity: decr   General Gait Details: Cues for posture, sequence, position from RW, and to increase UE WB    Stairs            Wheelchair Mobility    Modified Rankin (Stroke Patients Only)       Balance                                    Cognition Arousal/Alertness: Awake/alert Behavior During Therapy: WFL for tasks  assessed/performed Overall Cognitive Status: Within Functional Limits for tasks assessed                      Exercises      General Comments        Pertinent Vitals/Pain Pain with WB , moderate, premedicated.    Home Living                      Prior Function            PT Goals (current goals can now be found in the care plan section) Progress towards PT goals: Progressing toward goals    Frequency  Min 3X/week    PT Plan Frequency needs to be updated    Co-evaluation             End of Session Equipment Utilized During Treatment: Gait belt Activity Tolerance: Patient tolerated treatment well Patient left: in chair;with call bell/phone within reach;with family/visitor present     Time: 1208-1230 PT Time Calculation (min): 22 min  Charges:  $Gait Training: 8-22 mins                    G Codes:      Claretha Cooper 08/02/2013, 1:10 PM Tresa Endo PT [phone]

## 2013-08-02 NOTE — Progress Notes (Signed)
Patient ID: Sara Garza, female   DOB: [birth year], 80 y.o.   MRN: [id number] Subjective: 2 Days Post-Op Procedure(s) (LRB): ARTHROPLASTY BIPOLAR HIP (Right)    Patient reports pain as mild.  Comfortable, no events, grateful  Objective:   VITALS:   Filed Vitals:   08/02/13 0455  BP: 127/66  Pulse: 97  Temp: 98.1 F (36.7 C)  Resp: 20    Neurovascular intact Incision: dressing C/D/I  LABS  Recent Labs  07/31/13 0336 08/01/13 0558 08/02/13 0500  HGB 11.6* 9.6* 8.7*  HCT 33.9* 28.1* 26.3*  WBC 13.2* 7.1 5.4  PLT 173 127* 105*     Recent Labs  07/31/13 0336 08/01/13 0558 08/02/13 0500  NA 141 140 138  K 4.6 5.2 4.8  BUN 45* 39* 33*  CREATININE 1.99* 1.47* 1.50*  GLUCOSE 123* 109* 94     Recent Labs  07/31/13 0336  INR 0.99     Assessment/Plan: 2 Days Post-Op Procedure(s) (LRB): ARTHROPLASTY BIPOLAR HIP (Right)   Advance diet Up with therapy Discharge to SNF when ready medically  Stable for Orthopaedic standpoint Call with questions, [phone], [facility]

## 2013-08-02 NOTE — Progress Notes (Signed)
Clinical Social Work  CSW provided bed offers for patient. Patient has been to SNF but in the [place] area so is not familiar with [place]. Patient reports dtr will come to visit and they will review list together. CSW agreeable to follow up at later time in order to get patient's decision.  CSW will continue to follow.  Sindy Messing, LCSW (Coverage for [employer])

## 2013-08-02 NOTE — Progress Notes (Signed)
Patient ID: Sara Garza  female  [id number]    DOB: 03/17/1924    DOA: 07/31/2013  PCP: Georgetta Haber, MD  Assessment/Plan:   Fracture of hip, right postop day #2:  -s/p Right hip hemiarthroplasty 5/2  -PT/OT , Per patient PT started yesterday  -Lovenox for DVT proph: would DC to SNF on this for 2-3weeks if ok with Ortho   . DIABETES MELLITUS, TYPE II  -stable, SSI   . HYPERTENSION  -stable, resume norvasc   . Aortic valve disorder  -s/p AVR 1/14  -follow up with Dr.Cooper   . CAD (coronary artery disease)  -stable   CKD (chronic kidney disease), stage III  -improved with hydration, at baseline now    Atelectasis  -doubt pneumonia, completely asymptomatic  - do not think  antibiotics are warranted at this time, continue incentive spirometry   DVT Prophylaxis:Lovenox   Code Status:  Family Communication:  Disposition:Hopefully DC to skilled nursing facility in AM   Consultants: Orthopedics   Procedures:  Right hemiarthroplasty on 07/31/2013   Antibiotics:  Levofloxacin 5/2- 5/3    Subjective: Complaining of constipation otherwise pain controlled   Objective: Weight change:   Intake/Output Summary (Last 24 hours) at 08/02/13 1306 Last data filed at 08/02/13 1105  Gross per 24 hour  Intake 1321.67 ml  Output   1000 ml  Net 321.67 ml   Blood pressure 135/47, pulse 95, temperature 98.7 F (37.1 C), temperature source Oral, resp. rate 16, height 5' (1.524 m), weight 72.576 kg (160 lb), SpO2 94.00%.  Physical Exam: General: Alert and awake, oriented x3, not in any acute distress. CVS: S1-S2 clear, no murmur rubs or gallops Chest: clear to auscultation bilaterally, no wheezing, rales or rhonchi Abdomen: soft nontender, nondistended, normal bowel sounds  Extremities: no cyanosis, clubbing or edema noted bilaterally Neuro: Cranial nerves II-XII intact, no focal neurological deficits  Lab Results: Basic Metabolic Panel:  Recent  Labs Lab 08/01/13 0558 08/02/13 0500  NA 140 138  K 5.2 4.8  CL 106 106  CO2 23 23  GLUCOSE 109* 94  BUN 39* 33*  CREATININE 1.47* 1.50*  CALCIUM 8.5 8.3*   Liver Function Tests:  Recent Labs Lab 07/31/13 1010  ALBUMIN 3.3*   No results found for this basename: LIPASE, AMYLASE,  in the last 168 hours No results found for this basename: AMMONIA,  in the last 168 hours CBC:  Recent Labs Lab 07/31/13 0336 08/01/13 0558 08/02/13 0500  WBC 13.2* 7.1 5.4  NEUTROABS 11.6*  --   --   HGB 11.6* 9.6* 8.7*  HCT 33.9* 28.1* 26.3*  MCV 89.0 90.1 91.6  PLT 173 127* 105*   Cardiac Enzymes:  Recent Labs Lab 07/31/13 1010  TROPONINI <0.30   BNP: No components found with this basename: POCBNP,  CBG:  Recent Labs Lab 08/01/13 1154 08/01/13 1719 08/01/13 2143 08/02/13 0739 08/02/13 1142  GLUCAP 83 91 99 88 91     Micro Results: No results found for this or any previous visit (from the past 240 hour(s)).  Studies/Results: Dg Hip Complete Right  07/31/2013   CLINICAL DATA:  Right upper leg and hip pain with limited weight-bearing after a fall.  EXAM: RIGHT HIP - COMPLETE 2+ VIEW  COMPARISON:  None.  FINDINGS: Subcapital fracture of the right femoral neck with varus angulation of fracture fragments. No dislocation at the hip joint. Degenerative changes in the right hip. Vascular calcifications. Visualized pelvis appears intact.  IMPRESSION: Fracture the right femoral  neck with varus angulation.   Electronically Signed   By: Lucienne Capers M.D.   On: 07/31/2013 04:01   Dg Pelvis Portable  07/31/2013   CLINICAL DATA:  Postop right hip arthroplasty  EXAM: PORTABLE PELVIS 1-2 VIEWS  COMPARISON:  None.  FINDINGS: Postsurgical change from right hip arthroplasty identified. The hardware components are in anatomic alignment and no complications identified. No periprosthetic fracture or dislocation.  IMPRESSION: 1. No complications status post right hip arthroplasty.    Electronically Signed   By: Kerby Moors M.D.   On: 07/31/2013 15:35   Dg Chest Port 1 View  07/31/2013   CLINICAL DATA:  And fall.  Hip fracture.  Right leg pain.  EXAM: PORTABLE CHEST - 1 VIEW  COMPARISON:  DG DEXA AXIAL SKELETON - NRPT [facility] dated 03/17/2013; DG RIBS UNILATERAL*R* dated 12/29/2006  FINDINGS: Shallow inspiration. Borderline heart size and pulmonary vascularity are likely normal for technique. Suggestion of linear atelectasis in the left lung base. Calcification in the mitral valve annulus with stent in the heart. No focal airspace disease. No blunting of costophrenic angles. No pneumothorax. Degenerative changes in the spine and shoulders.  IMPRESSION: Shallow inspiration with atelectasis in the left lung base.   Electronically Signed   By: Lucienne Capers M.D.   On: 07/31/2013 04:33    Medications: Scheduled Meds: . amLODipine  5 mg Oral Daily  . docusate sodium  100 mg Oral BID  . enoxaparin (LOVENOX) injection  30 mg Subcutaneous Daily  . ezetimibe  10 mg Oral Daily  . ferrous sulfate  325 mg Oral TID PC  . insulin aspart  0-9 Units Subcutaneous TID AC  . latanoprost  1 drop Both Eyes QHS  . pantoprazole  40 mg Oral Daily      LOS: 2 days   Jaiya Mooradian Krystal Eaton M.D. [facility] 08/02/2013, 1:06 PM Pager: [phone]  If 7PM-7AM, please contact night-coverage [email or web address] Password [email or web address]  **Disclaimer: This note was dictated with voice recognition software. Similar sounding words can inadvertently be transcribed and this note may contain transcription errors which may not have been corrected upon publication of note.**

## 2013-08-03 ENCOUNTER — Encounter (HOSPITAL_COMMUNITY): Payer: Self-pay | Admitting: Orthopedic Surgery

## 2013-08-03 DIAGNOSIS — I6529 Occlusion and stenosis of unspecified carotid artery: Secondary | ICD-10-CM | POA: Diagnosis not present

## 2013-08-03 DIAGNOSIS — N183 Chronic kidney disease, stage 3 unspecified: Secondary | ICD-10-CM | POA: Diagnosis not present

## 2013-08-03 DIAGNOSIS — R0989 Other specified symptoms and signs involving the circulatory and respiratory systems: Secondary | ICD-10-CM | POA: Diagnosis not present

## 2013-08-03 DIAGNOSIS — E119 Type 2 diabetes mellitus without complications: Secondary | ICD-10-CM | POA: Diagnosis not present

## 2013-08-03 DIAGNOSIS — I2510 Atherosclerotic heart disease of native coronary artery without angina pectoris: Secondary | ICD-10-CM | POA: Diagnosis not present

## 2013-08-03 DIAGNOSIS — M25569 Pain in unspecified knee: Secondary | ICD-10-CM | POA: Diagnosis not present

## 2013-08-03 DIAGNOSIS — I359 Nonrheumatic aortic valve disorder, unspecified: Secondary | ICD-10-CM | POA: Diagnosis not present

## 2013-08-03 DIAGNOSIS — M810 Age-related osteoporosis without current pathological fracture: Secondary | ICD-10-CM | POA: Diagnosis not present

## 2013-08-03 DIAGNOSIS — Z5189 Encounter for other specified aftercare: Secondary | ICD-10-CM | POA: Diagnosis not present

## 2013-08-03 DIAGNOSIS — M1710 Unilateral primary osteoarthritis, unspecified knee: Secondary | ICD-10-CM | POA: Diagnosis not present

## 2013-08-03 DIAGNOSIS — M353 Polymyalgia rheumatica: Secondary | ICD-10-CM | POA: Diagnosis not present

## 2013-08-03 DIAGNOSIS — Z96649 Presence of unspecified artificial hip joint: Secondary | ICD-10-CM | POA: Diagnosis not present

## 2013-08-03 DIAGNOSIS — D649 Anemia, unspecified: Secondary | ICD-10-CM | POA: Diagnosis not present

## 2013-08-03 DIAGNOSIS — M25559 Pain in unspecified hip: Secondary | ICD-10-CM | POA: Diagnosis not present

## 2013-08-03 DIAGNOSIS — S72009D Fracture of unspecified part of neck of unspecified femur, subsequent encounter for closed fracture with routine healing: Secondary | ICD-10-CM | POA: Diagnosis not present

## 2013-08-03 DIAGNOSIS — S72009A Fracture of unspecified part of neck of unspecified femur, initial encounter for closed fracture: Secondary | ICD-10-CM | POA: Diagnosis not present

## 2013-08-03 DIAGNOSIS — E785 Hyperlipidemia, unspecified: Secondary | ICD-10-CM | POA: Diagnosis not present

## 2013-08-03 DIAGNOSIS — Z9181 History of falling: Secondary | ICD-10-CM | POA: Diagnosis not present

## 2013-08-03 DIAGNOSIS — R262 Difficulty in walking, not elsewhere classified: Secondary | ICD-10-CM | POA: Diagnosis not present

## 2013-08-03 DIAGNOSIS — I10 Essential (primary) hypertension: Secondary | ICD-10-CM | POA: Diagnosis not present

## 2013-08-03 DIAGNOSIS — M129 Arthropathy, unspecified: Secondary | ICD-10-CM | POA: Diagnosis not present

## 2013-08-03 LAB — GLUCOSE, CAPILLARY
GLUCOSE-CAPILLARY: 120 mg/dL — AB (ref 70–99)
Glucose-Capillary: 93 mg/dL (ref 70–99)

## 2013-08-03 MED ORDER — ACETAMINOPHEN 325 MG PO TABS: 650.0000 mg | ORAL_TABLET | Freq: Four times a day (QID) | ORAL | Status: DC | PRN

## 2013-08-03 MED ORDER — HYDROCODONE-ACETAMINOPHEN 5-325 MG PO TABS: 1.0000 | ORAL_TABLET | Freq: Four times a day (QID) | ORAL | Status: DC | PRN

## 2013-08-03 MED ORDER — DSS 100 MG PO CAPS: 100.0000 mg | ORAL_CAPSULE | Freq: Two times a day (BID) | ORAL | Status: DC

## 2013-08-03 MED ORDER — PANTOPRAZOLE SODIUM 40 MG PO TBEC: 40.0000 mg | DELAYED_RELEASE_TABLET | Freq: Every day | ORAL | Status: DC

## 2013-08-03 MED ORDER — ASPIRIN EC 81 MG PO TBEC: 81.0000 mg | DELAYED_RELEASE_TABLET | Freq: Every day | ORAL | Status: DC

## 2013-08-03 MED ORDER — POLYETHYLENE GLYCOL 3350 17 G PO PACK: 17.0000 g | PACK | Freq: Every day | ORAL | Status: DC | PRN

## 2013-08-03 MED ORDER — ENOXAPARIN SODIUM 40 MG/0.4ML ~~LOC~~ SOLN: 40.0000 mg | SUBCUTANEOUS | Status: DC

## 2013-08-03 MED ORDER — FERROUS SULFATE 325 (65 FE) MG PO TABS: 325.0000 mg | ORAL_TABLET | Freq: Three times a day (TID) | ORAL | Status: DC

## 2013-08-03 NOTE — Progress Notes (Signed)
Patient ID: Sara Garza, female   DOB: 03-04-1924, 80 y.o.   MRN: [id number]   Subjective: 3 Days Post-Op Procedure(s) (LRB): ARTHROPLASTY BIPOLAR HIP (Right)   Patient reports pain as moderate. No ortho complaint  Objective:   VITALS:   Filed Vitals:   08/03/13 0800  BP:   Pulse:   Temp:   Resp: 18    Neurovascular intact Compartment soft Dressing intact  LABS  Recent Labs  08/01/13 0558 08/02/13 0500  HGB 9.6* 8.7*  HCT 28.1* 26.3*  WBC 7.1 5.4  PLT 127* 105*     Recent Labs  08/01/13 0558 08/02/13 0500  NA 140 138  K 5.2 4.8  BUN 39* 33*  CREATININE 1.47* 1.50*  GLUCOSE 109* 94     Assessment/Plan: 3 Days Post-Op Procedure(s) (LRB): ARTHROPLASTY BIPOLAR HIP (Right)   Up with therapy Discharge to SNF as per medicine  Lovenox for DVT prophylaxis for 2 weeks 50% PWB Follow up in 2 weeks with Dr Alvan Dame No dressing change needed until follow up   Buck Mam  [facility] [id number]  08/03/2013, 9:48 AM

## 2013-08-03 NOTE — Progress Notes (Signed)
Patient is set to discharge to [facility] today. CSW confirmed with patient's daughter, [name] that they have decided on [place]. Discharge packet in [place], Manuela Schwartz aware. [facility] called for transport pickup.   Clinical Social Work Department CLINICAL SOCIAL WORK PLACEMENT NOTE 08/03/2013  Patient:  Sara Garza, Sara Garza  Account Number:  [id number] Admit date:  07/31/2013  Clinical Social Worker:  Levie Heritage  Date/time:  08/01/2013 11:59 AM  Clinical Social Work is seeking post-discharge placement for this patient at the following level of care:   [place]   (*CSW will update this form in Epic as items are completed)   08/01/2013  Patient/family provided with [place] Department of Clinical Social Work's list of facilities offering this level of care within the geographic area requested by the patient (or if unable, by the patient's family).  08/01/2013  Patient/family informed of their freedom to choose among providers that offer the needed level of care, that participate in Medicare, Medicaid or managed care program needed by the patient, have an available bed and are willing to accept the patient.  08/01/2013  Patient/family informed of [facility]' ownership interest in [facility], as well as of the fact that they are under no obligation to receive care at this facility.  PASARR submitted to EDS on 08/03/2013 PASARR number received from EDS on 08/03/2013  FL2 transmitted to all facilities in geographic area requested by pt/family on  08/01/2013 FL2 transmitted to all facilities within larger geographic area on   Patient informed that his/her managed care company has contracts with or will negotiate with  certain facilities, including the following:     Patient/family informed of bed offers received:  08/03/2013 Patient chooses bed at [facility] Physician recommends and patient chooses bed at    Patient to be transferred to [facility]  at [facility] on  08/03/2013 Patient to be transferred to facility by [facility]  The following physician request were entered in Epic:   Additional Comments:   Raynaldo Opitz, [place] Worker cell #: [phone]

## 2013-08-03 NOTE — Progress Notes (Signed)
Physical Therapy Treatment Patient Details Name: Sara Garza MRN: [id number] DOB: 03-Jan-1924 Today's Date: 08/03/2013    History of Present Illness fall, s/p R hip hemiarthroplasty    PT Comments    *Pt progressing with mobility. She walked 32' with RW. **  Follow Up Recommendations  SNF     Equipment Recommendations  Rolling walker with 5" wheels    Recommendations for Other Services       Precautions / Restrictions Precautions Precautions: Posterior Hip;Fall Precaution Comments: Pt able to recall 1 THP - all precautions reviewed several times and additional hand out provided Restrictions Weight Bearing Restrictions: Yes RLE Weight Bearing: Partial weight bearing RLE Partial Weight Bearing Percentage or Pounds: 50%    Mobility  Bed Mobility               General bed mobility comments: pt up in chair at start of tx  Transfers Overall transfer level: Needs assistance Equipment used: Rolling walker (2 wheeled) Transfers: Sit to/from Stand Sit to Stand: Mod assist         General transfer comment: cues for LE management, use of UEs to self assist and adherence to all precautions, cues to push from arm rests of recliner and BSC.   Ambulation/Gait Ambulation/Gait assistance: Min assist Ambulation Distance (Feet): 25 Feet Assistive device: Rolling walker (2 wheeled) Gait Pattern/deviations: Step-to pattern;Decreased step length - right Gait velocity: decr   General Gait Details: Cues for posture, sequence, position from RW, and to increase UE WB    Stairs            Wheelchair Mobility    Modified Rankin (Stroke Patients Only)       Balance                                    Cognition Arousal/Alertness: Awake/alert Behavior During Therapy: WFL for tasks assessed/performed Overall Cognitive Status: Within Functional Limits for tasks assessed                      Exercises Total Joint Exercises Ankle  Circles/Pumps: AROM;Both;15 reps;Supine Short Arc Quad: AROM;Right;10 reps;Supine Heel Slides: AAROM;15 reps;Supine;Right Hip ABduction/ADduction: AAROM;Right;10 reps;Supine    General Comments        Pertinent Vitals/Pain **6/10 R hip with walking Ice applied*    Home Living                      Prior Function            PT Goals (current goals can now be found in the care plan section) Acute Rehab PT Goals Patient Stated Goal: Regain Independence PT Goal Formulation: With patient Time For Goal Achievement: 08/08/13 Potential to Achieve Goals: Good Progress towards PT goals: Progressing toward goals    Frequency  Min 3X/week    PT Plan Current plan remains appropriate    Co-evaluation             End of Session Equipment Utilized During Treatment: Gait belt Activity Tolerance: Patient tolerated treatment well Patient left: in chair;with call bell/phone within reach     Time: 1105-1130 PT Time Calculation (min): 25 min  Charges:  $Gait Training: 8-22 mins $Therapeutic Exercise: 8-22 mins                    G Codes:      Lucile Crater 08/03/2013,  12:23 PM [phone]

## 2013-08-03 NOTE — Progress Notes (Signed)
Report called to facility to a Gaspar Bidding LPN

## 2013-08-03 NOTE — Anesthesia Postprocedure Evaluation (Signed)
  Anesthesia Post-op Note  Patient: Sara Garza  Procedure(s) Performed: Procedure(s) (LRB): ARTHROPLASTY BIPOLAR HIP (Right)  Patient Location: PACU  Anesthesia Type: Spinal  Level of Consciousness: awake and alert   Airway and Oxygen Therapy: Patient Spontanous Breathing  Post-op Pain: mild  Post-op Assessment: Post-op Vital signs reviewed, Patient's Cardiovascular Status Stable, Respiratory Function Stable, Patent Airway and No signs of Nausea or vomiting  Last Vitals:  Filed Vitals:   08/03/13 0800  BP:   Pulse:   Temp:   Resp: 18    Post-op Vital Signs: stable   Complications: No apparent anesthesia complications

## 2013-08-03 NOTE — Discharge Summary (Signed)
Physician Discharge Summary  Patient ID: Sara Garza MRN: [id number] DOB/AGE: 10-03-1923 80 y.o.  Admit date: 07/31/2013 Discharge date: 08/03/2013  Primary Care Physician:  Sara Haber, MD  Discharge Diagnoses:    . Fracture of hip, right, closed s/p ARTHROPLASTY BIPOLAR HIP (Right) on 07/31/13 . DIABETES MELLITUS, TYPE II . HYPERTENSION . Aortic valve disorder . CAD (coronary artery disease) . CKD (chronic kidney disease), stage III . Closed right hip fracture . Left bundle branch block  Consults: Orthopedics, Dr Alvan Dame   Recommendations for Outpatient Follow-up:  Lovenox for DVT prophylaxis for 2 weeks  50% PWB  Follow up in 2 weeks with Dr Alvan Dame  No dressing change needed until follow up   Allergies:   Allergies  Allergen Reactions  . Statins Other (See Comments)    Muscle aches     Discharge Medications:   Medication List    STOP taking these medications       irbesartan-hydrochlorothiazide 150-12.5 MG per tablet  Commonly known as:  AVALIDE     naproxen sodium 220 MG tablet  Commonly known as:  ANAPROX      TAKE these medications       acetaminophen 325 MG tablet  Commonly known as:  TYLENOL  Take 2 tablets (650 mg total) by mouth every 6 (six) hours as needed for mild pain (or Fever >/= 101).     amLODipine 5 MG tablet  Commonly known as:  NORVASC  Take 1 tablet (5 mg total) by mouth daily.     aspirin EC 81 MG tablet  Take 1 tablet (81 mg total) by mouth daily. Start after lovenox injections are done after 2 weeks     CALCIUM 500 +D 500-400 MG-UNIT Tabs  Generic drug:  Calcium Carb-Cholecalciferol  Take 1 tablet by mouth daily.     DSS 100 MG Caps  Take 100 mg by mouth 2 (two) times daily.     enoxaparin 40 MG/0.4ML injection  Commonly known as:  LOVENOX  Inject 0.4 mLs (40 mg total) into the skin daily.     ezetimibe 10 MG tablet  Commonly known as:  ZETIA  Take 1 tablet (10 mg total) by mouth daily.     ferrous sulfate 325 (65  FE) MG tablet  Take 1 tablet (325 mg total) by mouth 3 (three) times daily after meals.     HYDROcodone-acetaminophen 5-325 MG per tablet  Commonly known as:  NORCO  Take 1-2 tablets by mouth every 6 (six) hours as needed.     LUMIGAN 0.01 % Soln  Generic drug:  bimatoprost  Place 1 drop into both eyes at bedtime.     multivitamin tablet  Take 1 tablet by mouth daily.     pantoprazole 40 MG tablet  Commonly known as:  PROTONIX  Take 1 tablet (40 mg total) by mouth daily.     polyethylene glycol packet  Commonly known as:  MIRALAX / GLYCOLAX  Take 17 g by mouth daily as needed for mild constipation.         Brief H and P: For complete details please refer to admission H and P, but in [name] is a 80 y.o. female  has a past medical history of Hyperlipidemia; Hypertension; Osteoporosis; PMR (polymyalgia rheumatica); Diabetes mellitus; Arthritis; and Carotid stenosis, left. Patient presented to the ER after a mechanical fall, tripped while walking up the hill. She fell backwards. She denied hitting her head.  She was supported to get home  but very soon the pain was too much to bear and they called EMS. In ER she was found to have right femoral neck with varus angulation.  Patient has hx of aortic valve replacement with metronic Valve not on anticoagulation. Patient also has CAD that has been stable. Denies any recent chest pain or shortness of breath. She is able to walk comfortably.    Hospital Course:    Fracture of hip, right postop day #3: s/p Right hip hemiarthroplasty 2022/08/21  - Continue physical therapy, Lovenox for DVT prophylaxis for 2 weeks, then restart aspirin - follow up with ortho in 2 weeks  . HYPERTENSION  -stable, resumed norvasc   . Aortic valve disorder  -s/p AVR 1/14, follow up with Dr.Cooper outpatient  . CAD (coronary artery disease)  -stable   CKD (chronic kidney disease), stage III  -improved with hydration, at baseline now, held  irbesartan-HCTZ, NSAIDS    Atelectasis  -doubt pneumonia, completely asymptomatic, do not think antibiotics are warranted at this time, continue incentive spirometry    Day of Discharge BP 131/54  Pulse 83  Temp(Src) 98.2 F (36.8 C) (Oral)  Resp 18  Ht 5' (1.524 m)  Wt 72.576 kg (160 lb)  BMI 31.25 kg/m2  SpO2 94%  Physical Exam: General: Alert and awake oriented x3 not in any acute distress. CVS: S1-S2 clear no murmur rubs or gallops Chest: clear to auscultation bilaterally, no wheezing rales or rhonchi Abdomen: soft nontender, nondistended, normal bowel sounds Extremities: no cyanosis, clubbing or edema noted bilaterally Neuro: Cranial nerves II-XII intact, no focal neurological deficits   The results of significant diagnostics from this hospitalization (including imaging, microbiology, ancillary and laboratory) are listed below for reference.    LAB RESULTS: Basic Metabolic Panel:  Recent Labs Lab 08/01/13 0558 08/02/13 0500  NA 140 138  K 5.2 4.8  CL 106 106  CO2 23 23  GLUCOSE 109* 94  BUN 39* 33*  CREATININE 1.47* 1.50*  CALCIUM 8.5 8.3*   Liver Function Tests:  Recent Labs Lab 08-20-13 1010  ALBUMIN 3.3*   No results found for this basename: LIPASE, AMYLASE,  in the last 168 hours No results found for this basename: AMMONIA,  in the last 168 hours CBC:  Recent Labs Lab 20-Aug-2013 0336 08/01/13 0558 08/02/13 0500  WBC 13.2* 7.1 5.4  NEUTROABS 11.6*  --   --   HGB 11.6* 9.6* 8.7*  HCT 33.9* 28.1* 26.3*  MCV 89.0 90.1 91.6  PLT 173 127* 105*   Cardiac Enzymes:  Recent Labs Lab 08-20-2013 1010  TROPONINI <0.30   BNP: No components found with this basename: POCBNP,  CBG:  Recent Labs Lab 08/02/13 2211 08/03/13 0758  GLUCAP 127* 93    Significant Diagnostic Studies:  Dg Hip Complete Right  08/20/13   CLINICAL DATA:  Right upper leg and hip pain with limited weight-bearing after a fall.  EXAM: RIGHT HIP - COMPLETE 2+ VIEW   COMPARISON:  None.  FINDINGS: Subcapital fracture of the right femoral neck with varus angulation of fracture fragments. No dislocation at the hip joint. Degenerative changes in the right hip. Vascular calcifications. Visualized pelvis appears intact.  IMPRESSION: Fracture the right femoral neck with varus angulation.   Electronically Signed   By: Lucienne Capers M.D.   On: 20-Aug-2013 04:01   Dg Pelvis Portable  2013/08/20   CLINICAL DATA:  Postop right hip arthroplasty  EXAM: PORTABLE PELVIS 1-2 VIEWS  COMPARISON:  None.  FINDINGS: Postsurgical change from right  hip arthroplasty identified. The hardware components are in anatomic alignment and no complications identified. No periprosthetic fracture or dislocation.  IMPRESSION: 1. No complications status post right hip arthroplasty.   Electronically Signed   By: Kerby Moors M.D.   On: 07/31/2013 15:35   Dg Chest Port 1 View  07/31/2013   CLINICAL DATA:  And fall.  Hip fracture.  Right leg pain.  EXAM: PORTABLE CHEST - 1 VIEW  COMPARISON:  DG DEXA AXIAL SKELETON - NRPT [facility] dated 03/17/2013; DG RIBS UNILATERAL*R* dated 12/29/2006  FINDINGS: Shallow inspiration. Borderline heart size and pulmonary vascularity are likely normal for technique. Suggestion of linear atelectasis in the left lung base. Calcification in the mitral valve annulus with stent in the heart. No focal airspace disease. No blunting of costophrenic angles. No pneumothorax. Degenerative changes in the spine and shoulders.  IMPRESSION: Shallow inspiration with atelectasis in the left lung base.   Electronically Signed   By: Lucienne Capers M.D.   On: 07/31/2013 04:33       Disposition and Follow-up:     Discharge Orders   Future Appointments Provider Department Dept Phone   09/09/2013 8:00 AM [facility] [place] Office [phone]   Future Orders Complete By Expires   Increase activity slowly  As directed    Partial weight bearing  As directed    Scheduling  Instructions:   50%   Questions:     % Body Weight:     Laterality:     Extremity:         DISPOSITION: SNF  DIET: Carb modified diet  TESTS THAT NEED FOLLOW-UP BMET, CBC in one week  DISCHARGE FOLLOW-UP Follow-up Information   Follow up with [name], MD. Schedule an appointment as soon as possible for a visit in 2 weeks. (for hospital follow-up)    Specialty:  Orthopedic Surgery   Contact information:   [address] [place] [address] [phone]       Follow up with Sara Haber, MD. Schedule an appointment as soon as possible for a visit in 2 weeks. (for hospital follow-up)    Specialty:  Internal Medicine   Contact information:   [place] [address] [phone]       Time spent on Discharge: 45 mins  Signed:   Mendel Corning M.D. [facility] 08/03/2013, 12:30 PM Pager: [phone]   **Disclaimer: This note was dictated with voice recognition software. Similar sounding words can inadvertently be transcribed and this note may contain transcription errors which may not have been corrected upon publication of note.**

## 2013-08-04 DIAGNOSIS — D649 Anemia, unspecified: Secondary | ICD-10-CM | POA: Diagnosis not present

## 2013-08-04 DIAGNOSIS — M1710 Unilateral primary osteoarthritis, unspecified knee: Secondary | ICD-10-CM | POA: Diagnosis not present

## 2013-08-04 DIAGNOSIS — S72009D Fracture of unspecified part of neck of unspecified femur, subsequent encounter for closed fracture with routine healing: Secondary | ICD-10-CM | POA: Diagnosis not present

## 2013-08-04 DIAGNOSIS — N183 Chronic kidney disease, stage 3 unspecified: Secondary | ICD-10-CM | POA: Diagnosis not present

## 2013-08-04 DIAGNOSIS — E119 Type 2 diabetes mellitus without complications: Secondary | ICD-10-CM | POA: Diagnosis not present

## 2013-08-04 DIAGNOSIS — M25569 Pain in unspecified knee: Secondary | ICD-10-CM | POA: Diagnosis not present

## 2013-08-09 DIAGNOSIS — R262 Difficulty in walking, not elsewhere classified: Secondary | ICD-10-CM | POA: Diagnosis not present

## 2013-08-09 DIAGNOSIS — M25559 Pain in unspecified hip: Secondary | ICD-10-CM | POA: Diagnosis not present

## 2013-08-11 DIAGNOSIS — R262 Difficulty in walking, not elsewhere classified: Secondary | ICD-10-CM | POA: Diagnosis not present

## 2013-08-11 DIAGNOSIS — M25559 Pain in unspecified hip: Secondary | ICD-10-CM | POA: Diagnosis not present

## 2013-08-13 ENCOUNTER — Telehealth: Payer: Self-pay | Admitting: Internal Medicine

## 2013-08-13 ENCOUNTER — Telehealth: Payer: Self-pay | Admitting: Cardiovascular Disease

## 2013-08-13 NOTE — Telephone Encounter (Signed)
New Message:  Pt is just calling as an FYI ... Pt states she fell and broke her hip.. Had surgery on May 2nd and has been at [facility] rehab in [place] as of May 5th.... Pt states she would like to speak w/ Ander Purpura  [phone]

## 2013-08-13 NOTE — Telephone Encounter (Signed)
I have attempted to call the number listed in message x3 and it continues to be busy.

## 2013-08-13 NOTE — Telephone Encounter (Signed)
Pt called and said she fell on 07/30/13 and broke her hip had surgery on  07/31/13 Dr Alvan Dame, she is in [facility] in [place]  since 08/03/13 doing rehab  Phone number [phone]

## 2013-08-13 NOTE — Telephone Encounter (Signed)
fyi

## 2013-08-16 DIAGNOSIS — R262 Difficulty in walking, not elsewhere classified: Secondary | ICD-10-CM | POA: Diagnosis not present

## 2013-08-16 DIAGNOSIS — M25559 Pain in unspecified hip: Secondary | ICD-10-CM | POA: Diagnosis not present

## 2013-08-16 NOTE — Telephone Encounter (Signed)
I spoke with the pt and this was an [place]. The pt is in the process of doing physical therapy following hip surgery.

## 2013-08-20 DIAGNOSIS — M25559 Pain in unspecified hip: Secondary | ICD-10-CM | POA: Diagnosis not present

## 2013-08-20 DIAGNOSIS — R262 Difficulty in walking, not elsewhere classified: Secondary | ICD-10-CM | POA: Diagnosis not present

## 2013-08-27 DIAGNOSIS — M25559 Pain in unspecified hip: Secondary | ICD-10-CM | POA: Diagnosis not present

## 2013-08-27 DIAGNOSIS — R262 Difficulty in walking, not elsewhere classified: Secondary | ICD-10-CM | POA: Diagnosis not present

## 2013-09-08 DIAGNOSIS — M25559 Pain in unspecified hip: Secondary | ICD-10-CM | POA: Diagnosis not present

## 2013-09-08 DIAGNOSIS — R262 Difficulty in walking, not elsewhere classified: Secondary | ICD-10-CM | POA: Diagnosis not present

## 2013-09-09 ENCOUNTER — Other Ambulatory Visit: Payer: Medicare Other

## 2013-09-13 DIAGNOSIS — I10 Essential (primary) hypertension: Secondary | ICD-10-CM | POA: Diagnosis not present

## 2013-09-13 DIAGNOSIS — I2510 Atherosclerotic heart disease of native coronary artery without angina pectoris: Secondary | ICD-10-CM | POA: Diagnosis not present

## 2013-09-13 DIAGNOSIS — R269 Unspecified abnormalities of gait and mobility: Secondary | ICD-10-CM | POA: Diagnosis not present

## 2013-09-13 DIAGNOSIS — Z5189 Encounter for other specified aftercare: Secondary | ICD-10-CM | POA: Diagnosis not present

## 2013-09-13 DIAGNOSIS — M810 Age-related osteoporosis without current pathological fracture: Secondary | ICD-10-CM | POA: Diagnosis not present

## 2013-09-15 ENCOUNTER — Ambulatory Visit (INDEPENDENT_AMBULATORY_CARE_PROVIDER_SITE_OTHER): Payer: Medicare Other | Admitting: Family

## 2013-09-15 VITALS — BP 140/60 | HR 80 | Temp 98.3°F | Ht 60.0 in | Wt 170.0 lb

## 2013-09-15 DIAGNOSIS — I2510 Atherosclerotic heart disease of native coronary artery without angina pectoris: Secondary | ICD-10-CM

## 2013-09-15 DIAGNOSIS — Z9660 Presence of unspecified orthopedic joint implant: Secondary | ICD-10-CM | POA: Diagnosis not present

## 2013-09-15 DIAGNOSIS — N183 Chronic kidney disease, stage 3 unspecified: Secondary | ICD-10-CM

## 2013-09-15 DIAGNOSIS — Z96649 Presence of unspecified artificial hip joint: Secondary | ICD-10-CM

## 2013-09-15 DIAGNOSIS — I10 Essential (primary) hypertension: Secondary | ICD-10-CM | POA: Diagnosis not present

## 2013-09-15 DIAGNOSIS — E669 Obesity, unspecified: Secondary | ICD-10-CM | POA: Diagnosis not present

## 2013-09-15 DIAGNOSIS — E119 Type 2 diabetes mellitus without complications: Secondary | ICD-10-CM

## 2013-09-15 DIAGNOSIS — R351 Nocturia: Secondary | ICD-10-CM

## 2013-09-16 ENCOUNTER — Encounter: Payer: Self-pay | Admitting: Family

## 2013-09-16 ENCOUNTER — Telehealth: Payer: Self-pay | Admitting: Family

## 2013-09-16 DIAGNOSIS — R269 Unspecified abnormalities of gait and mobility: Secondary | ICD-10-CM | POA: Diagnosis not present

## 2013-09-16 DIAGNOSIS — I2510 Atherosclerotic heart disease of native coronary artery without angina pectoris: Secondary | ICD-10-CM | POA: Diagnosis not present

## 2013-09-16 DIAGNOSIS — I10 Essential (primary) hypertension: Secondary | ICD-10-CM | POA: Diagnosis not present

## 2013-09-16 DIAGNOSIS — M810 Age-related osteoporosis without current pathological fracture: Secondary | ICD-10-CM | POA: Diagnosis not present

## 2013-09-16 DIAGNOSIS — Z5189 Encounter for other specified aftercare: Secondary | ICD-10-CM | POA: Diagnosis not present

## 2013-09-16 NOTE — Progress Notes (Signed)
Pre visit review using our clinic review tool, if applicable. No additional management support is needed unless otherwise documented below in the visit note. 

## 2013-09-16 NOTE — Telephone Encounter (Addendum)
Pt unable to sch will call back once she can confirm a ride

## 2013-09-16 NOTE — Telephone Encounter (Signed)
Please call and schedule lab only appt since she was unable to have them done yesterday afternoon.

## 2013-09-17 ENCOUNTER — Encounter: Payer: Self-pay | Admitting: Family

## 2013-09-17 DIAGNOSIS — I10 Essential (primary) hypertension: Secondary | ICD-10-CM | POA: Diagnosis not present

## 2013-09-17 DIAGNOSIS — M810 Age-related osteoporosis without current pathological fracture: Secondary | ICD-10-CM | POA: Diagnosis not present

## 2013-09-17 DIAGNOSIS — I2510 Atherosclerotic heart disease of native coronary artery without angina pectoris: Secondary | ICD-10-CM | POA: Diagnosis not present

## 2013-09-17 DIAGNOSIS — Z5189 Encounter for other specified aftercare: Secondary | ICD-10-CM | POA: Diagnosis not present

## 2013-09-17 DIAGNOSIS — R269 Unspecified abnormalities of gait and mobility: Secondary | ICD-10-CM | POA: Diagnosis not present

## 2013-09-17 NOTE — Progress Notes (Signed)
Subjective:    Patient ID: Sara Garza, female    DOB: Feb 08, 1924, 80 y.o.   MRN: [id number]  HPI  80 yo Caucasian female presents for follow-up after rehabilitation from R total hip replacement at [facility]. During the hospitalization, irbesartan/HCTZ was held due to CKD Stage III (GFR 30) and amlodipine was increased from 5 mg to 10 mg. She became symptomatic at that dosage and self adjusted back to 5 mg. Her pressure today was 140/60.  She was also prescribed iron supplement after surgery, and she questions if this is of benefit since she has no symptoms of anemia at this time and has complaints of constipation with the iron supplement.  She also complains of nocturia (3-4 times per night).   Additionally, she questions whether she has T2DM as she has been off Metformin for longer than one year and her last A1C was 6.0 on 07/31/13. This is in the context of her recent hospitalization where she reports having to get her CBGs every 4 hours.   Review of Systems  Constitutional: Negative.   HENT: Negative.   Eyes: Negative.   Respiratory: Negative.   Cardiovascular: Negative.   Gastrointestinal: Negative.   Endocrine: Negative.   Genitourinary:       Nocturia (3-4 times per night)  Musculoskeletal: Negative.   Skin: Negative.   Neurological: Negative.   Hematological: Negative.   Psychiatric/Behavioral: Negative.    Past Medical History  Diagnosis Date  . Hyperlipidemia   . Hypertension   . Osteoporosis   . Diabetes mellitus   . Arthritis   . Carotid stenosis, left     50-60% stable    History   Social History  . Marital Status: Widowed    Spouse Name: N/A    Number of Children: N/A  . Years of Education: N/A   Occupational History  . Not on file.   Social History Main Topics  . Smoking status: Never Smoker   . Smokeless tobacco: Never Used  . Alcohol Use: Yes     Comment: very seldom  . Drug Use: No  . Sexual Activity: Not Currently   Other Topics Concern    . Not on file   Social History Narrative  . No narrative on file    Past Surgical History  Procedure Laterality Date  . Cesarean section    . Appendectomy    . Foot surgery      [name]  . Cardiac valve replacement  04/29/2012  . Hip arthroplasty Right 07/31/2013    Procedure: ARTHROPLASTY BIPOLAR HIP;  Surgeon: [name], MD;  Location: [facility]S;  Service: Orthopedics;  Laterality: Right;    Family History  Problem Relation Age of Onset  . Lung cancer    . COPD Father   . Cancer Father     lung cancer    Allergies  Allergen Reactions  . Statins Other (See Comments)    Muscle aches    Current Outpatient Prescriptions on File Prior to Visit  Medication Sig Dispense Refill  . acetaminophen (TYLENOL) 325 MG tablet Take 2 tablets (650 mg total) by mouth every 6 (six) hours as needed for mild pain (or Fever >/= 101).      [name] amLODipine (NORVASC) 5 MG tablet Take 1 tablet (5 mg total) by mouth daily.  30 tablet  11  . aspirin EC 81 MG tablet Take 1 tablet (81 mg total) by mouth daily. Start after lovenox injections are done after 2 weeks      .  bimatoprost (LUMIGAN) 0.01 % SOLN Place 1 drop into both eyes at bedtime.       . Calcium Carb-Cholecalciferol (CALCIUM 500 +D) 500-400 MG-UNIT TABS Take 1 tablet by mouth daily.       [name] docusate sodium 100 MG CAPS Take 100 mg by mouth 2 (two) times daily.  10 capsule  0  . ezetimibe (ZETIA) 10 MG tablet Take 1 tablet (10 mg total) by mouth daily.  90 tablet  3  . Multiple Vitamin (MULTIVITAMIN) tablet Take 1 tablet by mouth daily.        . polyethylene glycol (MIRALAX / GLYCOLAX) packet Take 17 g by mouth daily as needed for mild constipation.  14 each  0  . enoxaparin (LOVENOX) 40 MG/0.4ML injection Inject 0.4 mLs (40 mg total) into the skin daily.  12 Syringe  0  . ferrous sulfate 325 (65 FE) MG tablet Take 1 tablet (325 mg total) by mouth 3 (three) times daily after meals.    3  . HYDROcodone-acetaminophen (NORCO) 5-325 MG per  tablet Take 1-2 tablets by mouth every 6 (six) hours as needed.  90 tablet  0  . pantoprazole (PROTONIX) 40 MG tablet Take 1 tablet (40 mg total) by mouth daily.       No current facility-administered medications on file prior to visit.    BP 140/60  Pulse 80  Temp(Src) 98.3 F (36.8 C) (Oral)  Ht 5' (1.524 m)  Wt 170 lb (77.111 kg)  BMI 33.20 kg/m2     Objective:   Physical Exam  Constitutional: She is oriented to person, place, and time. She appears well-developed and well-nourished.  HENT:  Head: Normocephalic and atraumatic.  Eyes: EOM are normal. Pupils are equal, round, and reactive to light.  Neck: Normal range of motion. Neck supple.  Cardiovascular: Normal rate, regular rhythm, normal heart sounds and intact distal pulses.  Exam reveals no gallop and no friction rub.   No murmur heard. Pulmonary/Chest: Effort normal and breath sounds normal. She has no wheezes.  Musculoskeletal: Normal range of motion.  Neurological: She is alert and oriented to person, place, and time. No cranial nerve deficit.  Skin: Skin is warm and dry. She is not diaphoretic.  Psychiatric: She has a normal mood and affect. Her behavior is normal. Judgment and thought content normal.      Assessment & Plan:  Sara Garza was seen today for follow-up.  Diagnoses and associated orders for this visit:  Status post hip replacement - CBC with Differential; Future  Unspecified essential hypertension - CBC with Differential; Future  Obesity, unspecified - CBC with Differential; Future  Diabetes mellitus, controlled - CBC with Differential; Future - Hemoglobin A1c; Future  Nocturia - CBC with Differential; Future - POC Urinalysis Dipstick  Chronic kidney disease (CKD), stage III (moderate)    Return for labs; continue with 5 mg amlodopine until follow-up in 2 weeks.   D/C Fe supplement.

## 2013-09-20 ENCOUNTER — Other Ambulatory Visit (INDEPENDENT_AMBULATORY_CARE_PROVIDER_SITE_OTHER): Payer: Medicare Other

## 2013-09-20 DIAGNOSIS — R351 Nocturia: Secondary | ICD-10-CM

## 2013-09-20 DIAGNOSIS — M810 Age-related osteoporosis without current pathological fracture: Secondary | ICD-10-CM | POA: Diagnosis not present

## 2013-09-20 DIAGNOSIS — E119 Type 2 diabetes mellitus without complications: Secondary | ICD-10-CM

## 2013-09-20 DIAGNOSIS — E669 Obesity, unspecified: Secondary | ICD-10-CM

## 2013-09-20 DIAGNOSIS — I10 Essential (primary) hypertension: Secondary | ICD-10-CM

## 2013-09-20 DIAGNOSIS — R300 Dysuria: Secondary | ICD-10-CM

## 2013-09-20 DIAGNOSIS — Z96649 Presence of unspecified artificial hip joint: Secondary | ICD-10-CM | POA: Diagnosis not present

## 2013-09-20 DIAGNOSIS — I2510 Atherosclerotic heart disease of native coronary artery without angina pectoris: Secondary | ICD-10-CM | POA: Diagnosis not present

## 2013-09-20 DIAGNOSIS — R269 Unspecified abnormalities of gait and mobility: Secondary | ICD-10-CM | POA: Diagnosis not present

## 2013-09-20 DIAGNOSIS — Z5189 Encounter for other specified aftercare: Secondary | ICD-10-CM | POA: Diagnosis not present

## 2013-09-20 LAB — CBC WITH DIFFERENTIAL/PLATELET
BASOS PCT: 0.5 % (ref 0.0–3.0)
Basophils Absolute: 0 10*3/uL (ref 0.0–0.1)
EOS PCT: 2.6 % (ref 0.0–5.0)
Eosinophils Absolute: 0.2 10*3/uL (ref 0.0–0.7)
HCT: 33 % — ABNORMAL LOW (ref 36.0–46.0)
HEMOGLOBIN: 10.9 g/dL — AB (ref 12.0–15.0)
LYMPHS ABS: 1.5 10*3/uL (ref 0.7–4.0)
Lymphocytes Relative: 21.8 % (ref 12.0–46.0)
MCHC: 33 g/dL (ref 30.0–36.0)
MCV: 91.8 fl (ref 78.0–100.0)
MONOS PCT: 9.9 % (ref 3.0–12.0)
Monocytes Absolute: 0.7 10*3/uL (ref 0.1–1.0)
NEUTROS ABS: 4.4 10*3/uL (ref 1.4–7.7)
Neutrophils Relative %: 65.2 % (ref 43.0–77.0)
Platelets: 209 10*3/uL (ref 150.0–400.0)
RBC: 3.59 Mil/uL — AB (ref 3.87–5.11)
RDW: 13.8 % (ref 11.5–15.5)
WBC: 6.8 10*3/uL (ref 4.0–10.5)

## 2013-09-20 LAB — POCT URINALYSIS DIPSTICK
BILIRUBIN UA: NEGATIVE
GLUCOSE UA: NEGATIVE
Nitrite, UA: NEGATIVE
RBC UA: NEGATIVE
SPEC GRAV UA: 1.025
Urobilinogen, UA: 0.2
pH, UA: 5.5

## 2013-09-20 LAB — HEMOGLOBIN A1C: Hgb A1c MFr Bld: 5.5 % (ref 4.6–6.5)

## 2013-09-21 ENCOUNTER — Telehealth: Payer: Self-pay

## 2013-09-21 NOTE — Telephone Encounter (Signed)
Pt called for urine culture results. Advised pt that Pad[place] did not cx her urine. She states that she is having a lot of burning. Advised pt to come by the office to leave a urine sample and we will send it for a cx. She states that she doubts that she will be able to do that. I told her if she begins to feel worse or sxs persist, then she should call back or go to an Urgent Care. Pt verbalized understanding

## 2013-09-22 DIAGNOSIS — M810 Age-related osteoporosis without current pathological fracture: Secondary | ICD-10-CM | POA: Diagnosis not present

## 2013-09-22 DIAGNOSIS — Z5189 Encounter for other specified aftercare: Secondary | ICD-10-CM | POA: Diagnosis not present

## 2013-09-22 DIAGNOSIS — R269 Unspecified abnormalities of gait and mobility: Secondary | ICD-10-CM | POA: Diagnosis not present

## 2013-09-22 DIAGNOSIS — I2510 Atherosclerotic heart disease of native coronary artery without angina pectoris: Secondary | ICD-10-CM | POA: Diagnosis not present

## 2013-09-22 DIAGNOSIS — I10 Essential (primary) hypertension: Secondary | ICD-10-CM | POA: Diagnosis not present

## 2013-09-23 DIAGNOSIS — R269 Unspecified abnormalities of gait and mobility: Secondary | ICD-10-CM | POA: Diagnosis not present

## 2013-09-23 DIAGNOSIS — I2510 Atherosclerotic heart disease of native coronary artery without angina pectoris: Secondary | ICD-10-CM | POA: Diagnosis not present

## 2013-09-23 DIAGNOSIS — M810 Age-related osteoporosis without current pathological fracture: Secondary | ICD-10-CM | POA: Diagnosis not present

## 2013-09-23 DIAGNOSIS — I10 Essential (primary) hypertension: Secondary | ICD-10-CM | POA: Diagnosis not present

## 2013-09-23 DIAGNOSIS — Z5189 Encounter for other specified aftercare: Secondary | ICD-10-CM | POA: Diagnosis not present

## 2013-09-24 DIAGNOSIS — I10 Essential (primary) hypertension: Secondary | ICD-10-CM | POA: Diagnosis not present

## 2013-09-24 DIAGNOSIS — I2510 Atherosclerotic heart disease of native coronary artery without angina pectoris: Secondary | ICD-10-CM | POA: Diagnosis not present

## 2013-09-24 DIAGNOSIS — Z5189 Encounter for other specified aftercare: Secondary | ICD-10-CM | POA: Diagnosis not present

## 2013-09-24 DIAGNOSIS — R269 Unspecified abnormalities of gait and mobility: Secondary | ICD-10-CM | POA: Diagnosis not present

## 2013-09-24 DIAGNOSIS — M810 Age-related osteoporosis without current pathological fracture: Secondary | ICD-10-CM | POA: Diagnosis not present

## 2013-09-27 ENCOUNTER — Encounter: Payer: Self-pay | Admitting: Internal Medicine

## 2013-09-27 DIAGNOSIS — I2510 Atherosclerotic heart disease of native coronary artery without angina pectoris: Secondary | ICD-10-CM | POA: Diagnosis not present

## 2013-09-27 DIAGNOSIS — M810 Age-related osteoporosis without current pathological fracture: Secondary | ICD-10-CM | POA: Diagnosis not present

## 2013-09-27 DIAGNOSIS — Z5189 Encounter for other specified aftercare: Secondary | ICD-10-CM | POA: Diagnosis not present

## 2013-09-27 DIAGNOSIS — R269 Unspecified abnormalities of gait and mobility: Secondary | ICD-10-CM | POA: Diagnosis not present

## 2013-09-27 DIAGNOSIS — I10 Essential (primary) hypertension: Secondary | ICD-10-CM | POA: Diagnosis not present

## 2013-09-28 ENCOUNTER — Other Ambulatory Visit (INDEPENDENT_AMBULATORY_CARE_PROVIDER_SITE_OTHER): Payer: Medicare Other

## 2013-09-28 DIAGNOSIS — E785 Hyperlipidemia, unspecified: Secondary | ICD-10-CM

## 2013-09-28 LAB — HEPATIC FUNCTION PANEL
ALBUMIN: 3.8 g/dL (ref 3.5–5.2)
ALK PHOS: 74 U/L (ref 39–117)
ALT: 15 U/L (ref 0–35)
AST: 21 U/L (ref 0–37)
Bilirubin, Direct: 0 mg/dL (ref 0.0–0.3)
TOTAL PROTEIN: 7.6 g/dL (ref 6.0–8.3)
Total Bilirubin: 0.6 mg/dL (ref 0.2–1.2)

## 2013-09-28 LAB — LIPID PANEL
CHOL/HDL RATIO: 4
Cholesterol: 186 mg/dL (ref 0–200)
HDL: 42.7 mg/dL (ref >39.00)
LDL Cholesterol: 120 mg/dL — ABNORMAL HIGH (ref 0–99)
NonHDL: 143.3
TRIGLYCERIDES: 117 mg/dL (ref 0.0–149.0)
VLDL: 23.4 mg/dL (ref 0.0–40.0)

## 2013-09-29 DIAGNOSIS — M810 Age-related osteoporosis without current pathological fracture: Secondary | ICD-10-CM | POA: Diagnosis not present

## 2013-09-29 DIAGNOSIS — Z5189 Encounter for other specified aftercare: Secondary | ICD-10-CM | POA: Diagnosis not present

## 2013-09-29 DIAGNOSIS — I10 Essential (primary) hypertension: Secondary | ICD-10-CM | POA: Diagnosis not present

## 2013-09-29 DIAGNOSIS — I2510 Atherosclerotic heart disease of native coronary artery without angina pectoris: Secondary | ICD-10-CM | POA: Diagnosis not present

## 2013-09-29 DIAGNOSIS — R269 Unspecified abnormalities of gait and mobility: Secondary | ICD-10-CM | POA: Diagnosis not present

## 2013-09-30 DIAGNOSIS — I2510 Atherosclerotic heart disease of native coronary artery without angina pectoris: Secondary | ICD-10-CM | POA: Diagnosis not present

## 2013-09-30 DIAGNOSIS — M810 Age-related osteoporosis without current pathological fracture: Secondary | ICD-10-CM | POA: Diagnosis not present

## 2013-09-30 DIAGNOSIS — I10 Essential (primary) hypertension: Secondary | ICD-10-CM | POA: Diagnosis not present

## 2013-09-30 DIAGNOSIS — Z5189 Encounter for other specified aftercare: Secondary | ICD-10-CM | POA: Diagnosis not present

## 2013-09-30 DIAGNOSIS — R269 Unspecified abnormalities of gait and mobility: Secondary | ICD-10-CM | POA: Diagnosis not present

## 2013-10-06 DIAGNOSIS — R269 Unspecified abnormalities of gait and mobility: Secondary | ICD-10-CM | POA: Diagnosis not present

## 2013-10-06 DIAGNOSIS — I2510 Atherosclerotic heart disease of native coronary artery without angina pectoris: Secondary | ICD-10-CM | POA: Diagnosis not present

## 2013-10-06 DIAGNOSIS — I10 Essential (primary) hypertension: Secondary | ICD-10-CM | POA: Diagnosis not present

## 2013-10-06 DIAGNOSIS — M810 Age-related osteoporosis without current pathological fracture: Secondary | ICD-10-CM | POA: Diagnosis not present

## 2013-10-06 DIAGNOSIS — Z5189 Encounter for other specified aftercare: Secondary | ICD-10-CM | POA: Diagnosis not present

## 2013-10-08 DIAGNOSIS — I10 Essential (primary) hypertension: Secondary | ICD-10-CM | POA: Diagnosis not present

## 2013-10-08 DIAGNOSIS — M810 Age-related osteoporosis without current pathological fracture: Secondary | ICD-10-CM | POA: Diagnosis not present

## 2013-10-08 DIAGNOSIS — Z5189 Encounter for other specified aftercare: Secondary | ICD-10-CM | POA: Diagnosis not present

## 2013-10-08 DIAGNOSIS — I2510 Atherosclerotic heart disease of native coronary artery without angina pectoris: Secondary | ICD-10-CM | POA: Diagnosis not present

## 2013-10-08 DIAGNOSIS — R269 Unspecified abnormalities of gait and mobility: Secondary | ICD-10-CM | POA: Diagnosis not present

## 2013-10-11 DIAGNOSIS — I10 Essential (primary) hypertension: Secondary | ICD-10-CM | POA: Diagnosis not present

## 2013-10-11 DIAGNOSIS — I2510 Atherosclerotic heart disease of native coronary artery without angina pectoris: Secondary | ICD-10-CM | POA: Diagnosis not present

## 2013-10-11 DIAGNOSIS — M810 Age-related osteoporosis without current pathological fracture: Secondary | ICD-10-CM | POA: Diagnosis not present

## 2013-10-11 DIAGNOSIS — R269 Unspecified abnormalities of gait and mobility: Secondary | ICD-10-CM | POA: Diagnosis not present

## 2013-10-11 DIAGNOSIS — Z5189 Encounter for other specified aftercare: Secondary | ICD-10-CM | POA: Diagnosis not present

## 2013-10-15 DIAGNOSIS — I10 Essential (primary) hypertension: Secondary | ICD-10-CM | POA: Diagnosis not present

## 2013-10-15 DIAGNOSIS — I2510 Atherosclerotic heart disease of native coronary artery without angina pectoris: Secondary | ICD-10-CM | POA: Diagnosis not present

## 2013-10-15 DIAGNOSIS — R269 Unspecified abnormalities of gait and mobility: Secondary | ICD-10-CM | POA: Diagnosis not present

## 2013-10-15 DIAGNOSIS — M810 Age-related osteoporosis without current pathological fracture: Secondary | ICD-10-CM | POA: Diagnosis not present

## 2013-10-15 DIAGNOSIS — Z5189 Encounter for other specified aftercare: Secondary | ICD-10-CM | POA: Diagnosis not present

## 2013-10-19 DIAGNOSIS — M25559 Pain in unspecified hip: Secondary | ICD-10-CM | POA: Diagnosis not present

## 2013-10-21 DIAGNOSIS — M25559 Pain in unspecified hip: Secondary | ICD-10-CM | POA: Diagnosis not present

## 2013-10-22 DIAGNOSIS — M4310 Spondylolisthesis, site unspecified: Secondary | ICD-10-CM | POA: Diagnosis not present

## 2013-10-22 DIAGNOSIS — Z9660 Presence of unspecified orthopedic joint implant: Secondary | ICD-10-CM | POA: Diagnosis not present

## 2013-10-22 DIAGNOSIS — Z4789 Encounter for other orthopedic aftercare: Secondary | ICD-10-CM | POA: Diagnosis not present

## 2013-10-26 DIAGNOSIS — M25559 Pain in unspecified hip: Secondary | ICD-10-CM | POA: Diagnosis not present

## 2013-10-29 DIAGNOSIS — M25559 Pain in unspecified hip: Secondary | ICD-10-CM | POA: Diagnosis not present

## 2013-11-01 DIAGNOSIS — M25559 Pain in unspecified hip: Secondary | ICD-10-CM | POA: Diagnosis not present

## 2013-11-04 DIAGNOSIS — M25559 Pain in unspecified hip: Secondary | ICD-10-CM | POA: Diagnosis not present

## 2013-11-08 DIAGNOSIS — M25559 Pain in unspecified hip: Secondary | ICD-10-CM | POA: Diagnosis not present

## 2013-11-11 DIAGNOSIS — M25559 Pain in unspecified hip: Secondary | ICD-10-CM | POA: Diagnosis not present

## 2013-11-15 DIAGNOSIS — M25559 Pain in unspecified hip: Secondary | ICD-10-CM | POA: Diagnosis not present

## 2013-11-16 ENCOUNTER — Telehealth: Payer: Self-pay | Admitting: Internal Medicine

## 2013-11-16 NOTE — Telephone Encounter (Signed)
Pt would like to est with dr Burnice Logan. Can I sch?

## 2013-11-16 NOTE — Telephone Encounter (Signed)
ok 

## 2013-11-16 NOTE — Telephone Encounter (Signed)
Pt has been sch

## 2013-11-18 DIAGNOSIS — M25559 Pain in unspecified hip: Secondary | ICD-10-CM | POA: Diagnosis not present

## 2013-11-23 DIAGNOSIS — M25559 Pain in unspecified hip: Secondary | ICD-10-CM | POA: Diagnosis not present

## 2013-11-26 DIAGNOSIS — M25559 Pain in unspecified hip: Secondary | ICD-10-CM | POA: Diagnosis not present

## 2013-11-29 DIAGNOSIS — M25559 Pain in unspecified hip: Secondary | ICD-10-CM | POA: Diagnosis not present

## 2013-11-30 DIAGNOSIS — I359 Nonrheumatic aortic valve disorder, unspecified: Secondary | ICD-10-CM | POA: Diagnosis not present

## 2013-12-01 DIAGNOSIS — H409 Unspecified glaucoma: Secondary | ICD-10-CM | POA: Diagnosis not present

## 2013-12-01 DIAGNOSIS — H3530 Unspecified macular degeneration: Secondary | ICD-10-CM | POA: Diagnosis not present

## 2013-12-01 DIAGNOSIS — H4011X0 Primary open-angle glaucoma, stage unspecified: Secondary | ICD-10-CM | POA: Diagnosis not present

## 2013-12-01 DIAGNOSIS — E119 Type 2 diabetes mellitus without complications: Secondary | ICD-10-CM | POA: Diagnosis not present

## 2013-12-01 LAB — HM DIABETES EYE EXAM

## 2013-12-02 DIAGNOSIS — M25559 Pain in unspecified hip: Secondary | ICD-10-CM | POA: Diagnosis not present

## 2013-12-03 ENCOUNTER — Encounter: Payer: Self-pay | Admitting: Internal Medicine

## 2013-12-07 ENCOUNTER — Ambulatory Visit: Payer: Medicare Other | Admitting: Cardiovascular Disease

## 2013-12-07 DIAGNOSIS — M25559 Pain in unspecified hip: Secondary | ICD-10-CM | POA: Diagnosis not present

## 2013-12-10 ENCOUNTER — Ambulatory Visit: Payer: Medicare Other | Admitting: Family Medicine

## 2013-12-10 DIAGNOSIS — Z96649 Presence of unspecified artificial hip joint: Secondary | ICD-10-CM | POA: Diagnosis not present

## 2013-12-10 DIAGNOSIS — M25559 Pain in unspecified hip: Secondary | ICD-10-CM | POA: Diagnosis not present

## 2013-12-10 DIAGNOSIS — M4310 Spondylolisthesis, site unspecified: Secondary | ICD-10-CM | POA: Diagnosis not present

## 2013-12-10 DIAGNOSIS — Z4789 Encounter for other orthopedic aftercare: Secondary | ICD-10-CM | POA: Diagnosis not present

## 2013-12-13 ENCOUNTER — Other Ambulatory Visit: Payer: Self-pay | Admitting: Cardiovascular Disease

## 2013-12-24 ENCOUNTER — Encounter: Payer: Self-pay | Admitting: Cardiovascular Disease

## 2013-12-24 ENCOUNTER — Ambulatory Visit (INDEPENDENT_AMBULATORY_CARE_PROVIDER_SITE_OTHER): Payer: Medicare Other | Admitting: Cardiovascular Disease

## 2013-12-24 VITALS — BP 140/72 | HR 70 | Ht 60.0 in | Wt 161.0 lb

## 2013-12-24 DIAGNOSIS — I359 Nonrheumatic aortic valve disorder, unspecified: Secondary | ICD-10-CM

## 2013-12-24 DIAGNOSIS — I2510 Atherosclerotic heart disease of native coronary artery without angina pectoris: Secondary | ICD-10-CM

## 2013-12-24 NOTE — Patient Instructions (Signed)
Your physician recommends that you continue on your current medications as directed. Please refer to the Current Medication list given to you today.  Your physician wants you to follow-up in: 1 year. You will receive a reminder letter in the mail two months in advance. If you don't receive a letter, please call our office to schedule the follow-up appointment.  

## 2013-12-24 NOTE — Progress Notes (Signed)
HPI:   80 year old woman presenting for followup evaluation. The patient has a history of a [place] stenosis and underwent [name] at [facility] in 2014. She was treated with a 23 mm Medtronic Corevalve through the moderate risk SurTAVI aortic stenosis trial. She also is followed for coronary artery disease which was evaluated with pressure wire analysis and found to be nonflow obstructive.  The patient continues to do very well. Her echo images done at [facility] as part of the study protocol. She feels well and denies chest pain, lightheadedness, palpitations, orthopnea, or PND. She has mild exertional dyspnea which is unchanged over time. She fell and sustained a hip fracture several months ago. She has not gotten back to regular exercise since then.    Outpatient Encounter Prescriptions as of 12/24/2013  Medication Sig  . amLODipine (NORVASC) 5 MG tablet Take 1 tablet (5 mg total) by mouth daily.  [name] aspirin EC 81 MG tablet Take 1 tablet (81 mg total) by mouth daily. Start after lovenox injections are done after 2 weeks  . bimatoprost (LUMIGAN) 0.01 % SOLN Place 1 drop into both eyes at bedtime.   . Calcium Carb-Cholecalciferol (CALCIUM 500 +D) 500-400 MG-UNIT TABS Take 1 tablet by mouth daily.   [name] ezetimibe (ZETIA) 10 MG tablet Take 1 tablet (10 mg total) by mouth daily.  . Multiple Vitamin (MULTIVITAMIN) tablet Take 1 tablet by mouth daily.    . Multiple Vitamins-Minerals (EYE VITAMINS PO) Take 2 capsules by mouth 2 (two) times daily at 8 am and 10 pm.  . amoxicillin (AMOXIL) 500 MG capsule TAKE 4 TABLETS ONE HOUR PRIOR TO DENTAL PROCEDURES  . [DISCONTINUED] acetaminophen (TYLENOL) 325 MG tablet Take 2 tablets (650 mg total) by mouth every 6 (six) hours as needed for mild pain (or Fever >/= 101).  . [DISCONTINUED] docusate sodium 100 MG CAPS Take 100 mg by mouth 2 (two) times daily.  . [DISCONTINUED] enoxaparin (LOVENOX) 40 MG/0.4ML injection Inject 0.4 mLs (40 mg  total) into the skin daily.  . [DISCONTINUED] ferrous sulfate 325 (65 FE) MG tablet Take 1 tablet (325 mg total) by mouth 3 (three) times daily after meals.  . [DISCONTINUED] HYDROcodone-acetaminophen (NORCO) 5-325 MG per tablet Take 1-2 tablets by mouth every 6 (six) hours as needed.  . [DISCONTINUED] pantoprazole (PROTONIX) 40 MG tablet Take 1 tablet (40 mg total) by mouth daily.  . [DISCONTINUED] polyethylene glycol (MIRALAX / GLYCOLAX) packet Take 17 g by mouth daily as needed for mild constipation.    Allergies  Allergen Reactions  . Statins Other (See Comments)    Muscle aches    Past Medical History  Diagnosis Date  . Hyperlipidemia   . Hypertension   . Osteoporosis   . Diabetes mellitus   . Arthritis   . Carotid stenosis, left     50-60% stable    ROS: Negative except as per HPI  BP 140/72  Pulse 70  Ht 5' (1.524 m)  Wt 161 lb (73.029 kg)  BMI 31.44 kg/m2  PHYSICAL EXAM: Pt is alert and oriented, pleasant elderly woman in NAD HEENT: normal Neck: JVP - normal, carotids 2+= with soft bilateral bruits Lungs: CTA bilaterally CV: RRR with grade 2/6 ejection murmur at the right upper sternal border Abd: soft, NT, Positive BS, no hepatomegaly Ext: no C/C/E, distal pulses intact and equal Skin: warm/dry no rash  ASSESSMENT AND PLAN: 1. Aortic stenosis,  s/p [name] (23 Medtronic Core Valve). The patient has [place]  Association class I symptoms with exertional dyspnea. Overall I think she is stable and will continue her current medical regimen at this point. She has had a one-year followup echocardiogram at [facility] as part of the SURTAVI protocol. I will see her back in one year for followup.  2. HTN - controlled on amlodipine  3. Hyperlipidemia. The patient is statin intolerant. Most recent lipids reviewed and they are improved on Zetia. Will continue the same. Lipid Panel     Component Value Date/Time   CHOL 186 09/28/2013 0843   TRIG 117.0  09/28/2013 0843   HDL 42.70 09/28/2013 0843   CHOLHDL 4 09/28/2013 0843   VLDL 23.4 09/28/2013 0843   LDLCALC 120* 09/28/2013 0843   LDLDIRECT 158.6 10/23/2012 1223   4. CAD, native vessel. Stable without sx's of angina.   Sherren Mocha 12/24/2013 12:44 PM

## 2013-12-28 ENCOUNTER — Other Ambulatory Visit: Payer: Self-pay | Admitting: Dermatology

## 2013-12-28 DIAGNOSIS — C44319 Basal cell carcinoma of skin of other parts of face: Secondary | ICD-10-CM | POA: Diagnosis not present

## 2013-12-28 DIAGNOSIS — D1801 Hemangioma of skin and subcutaneous tissue: Secondary | ICD-10-CM | POA: Diagnosis not present

## 2013-12-28 DIAGNOSIS — L819 Disorder of pigmentation, unspecified: Secondary | ICD-10-CM | POA: Diagnosis not present

## 2013-12-28 DIAGNOSIS — L821 Other seborrheic keratosis: Secondary | ICD-10-CM | POA: Diagnosis not present

## 2013-12-28 DIAGNOSIS — L820 Inflamed seborrheic keratosis: Secondary | ICD-10-CM | POA: Diagnosis not present

## 2013-12-28 DIAGNOSIS — D235 Other benign neoplasm of skin of trunk: Secondary | ICD-10-CM | POA: Diagnosis not present

## 2013-12-28 DIAGNOSIS — D485 Neoplasm of uncertain behavior of skin: Secondary | ICD-10-CM | POA: Diagnosis not present

## 2013-12-30 ENCOUNTER — Ambulatory Visit: Payer: Medicare Other | Admitting: Internal Medicine

## 2013-12-31 ENCOUNTER — Ambulatory Visit: Payer: Medicare Other | Admitting: Internal Medicine

## 2014-01-03 ENCOUNTER — Ambulatory Visit: Payer: Medicare Other | Admitting: Internal Medicine

## 2014-01-04 ENCOUNTER — Ambulatory Visit (INDEPENDENT_AMBULATORY_CARE_PROVIDER_SITE_OTHER): Payer: Medicare Other | Admitting: Internal Medicine

## 2014-01-04 ENCOUNTER — Encounter: Payer: Self-pay | Admitting: Internal Medicine

## 2014-01-04 VITALS — BP 160/80 | HR 79 | Temp 98.3°F | Resp 20 | Ht <= 58 in | Wt 166.0 lb

## 2014-01-04 DIAGNOSIS — Z23 Encounter for immunization: Secondary | ICD-10-CM | POA: Diagnosis not present

## 2014-01-04 DIAGNOSIS — I2510 Atherosclerotic heart disease of native coronary artery without angina pectoris: Secondary | ICD-10-CM

## 2014-01-04 NOTE — Progress Notes (Signed)
Subjective:    Patient ID: Sara Garza, female    DOB: [birth year], 80 y.o.   MRN: [id number]  HPI  80 year old woman who is seen today to assess this with my practice.  The patient has a history of a [place] stenosis and underwent [name] at [facility] in January of 2014. She was treated with a 23 mm Medtronic Corevalve through the moderate risk SurTAVI aortic stenosis trial. She also is followed for coronary artery disease which was evaluated with pressure wire analysis and found to be nonflow obstructive.  The patient continues to do very well. Her echo images done at [facility] as part of the study protocol. She feels well and denies chest pain, lightheadedness, palpitations, orthopnea, or PND. She has mild exertional dyspnea which is unchanged over time. She fell and sustained a hip fracture several months ago.  She has been intolerant of statin therapy and presently is on Zetia; her blood pressure remains quite well controlled on amlodipine. She is on amoxicillin SBE prophylaxis prior to dental procedures.  She takes daily low-dose aspirin  Past Medical History  Diagnosis Date  . Hyperlipidemia   . Hypertension   . Osteoporosis   . Diabetes mellitus   . Arthritis   . Carotid stenosis, left     50-60% stable    History   Social History  . Marital Status: Widowed    Spouse Name: N/A    Number of Children: N/A  . Years of Education: N/A   Occupational History  . Not on file.   Social History Main Topics  . Smoking status: Never Smoker   . Smokeless tobacco: Never Used  . Alcohol Use: Yes     Comment: very seldom  . Drug Use: No  . Sexual Activity: Not Currently   Other Topics Concern  . Not on file   Social History Narrative  . No narrative on file    Past Surgical History  Procedure Laterality Date  . Cesarean section    . Appendectomy    . Foot surgery      [name]  . Cardiac valve replacement  04/29/2012  . Hip arthroplasty  Right 07/31/2013    Procedure: ARTHROPLASTY BIPOLAR HIP;  Surgeon: [name], MD;  Location: [facility];  Service: Orthopedics;  Laterality: Right;    Family History  Problem Relation Age of Onset  . Lung cancer    . COPD Father   . Cancer Father     lung cancer    Allergies  Allergen Reactions  . Statins Other (See Comments)    Muscle aches    Current Outpatient Prescriptions on File Prior to Visit  Medication Sig Dispense Refill  . amLODipine (NORVASC) 5 MG tablet Take 1 tablet (5 mg total) by mouth daily.  30 tablet  11  . amoxicillin (AMOXIL) 500 MG capsule TAKE 4 TABLETS ONE HOUR PRIOR TO DENTAL PROCEDURES  8 capsule  2  . aspirin EC 81 MG tablet Take 1 tablet (81 mg total) by mouth daily. Start after lovenox injections are done after 2 weeks      . bimatoprost (LUMIGAN) 0.01 % SOLN Place 1 drop into both eyes at bedtime.       . Calcium Carb-Cholecalciferol (CALCIUM 500 +D) 500-400 MG-UNIT TABS Take 1 tablet by mouth daily.       [name] ezetimibe (ZETIA) 10 MG tablet Take 1 tablet (10 mg total) by mouth daily.  90 tablet  3  .  Multiple Vitamin (MULTIVITAMIN) tablet Take 1 tablet by mouth daily.        . Multiple Vitamins-Minerals (EYE VITAMINS PO) Take 2 capsules by mouth 2 (two) times daily at 8 am and 10 pm.       No current facility-administered medications on file prior to visit.    BP 160/80  Pulse 79  Temp(Src) 98.3 F (36.8 C) (Oral)  Resp 20  Ht 4\' 10"  (1.473 m)  Wt 166 lb (75.297 kg)  BMI 34.70 kg/m2  SpO2 96%       Review of Systems  Constitutional: Negative.   HENT: Negative for congestion, dental problem, hearing loss, rhinorrhea, sinus pressure, sore throat and tinnitus.   Eyes: Negative for pain, discharge and visual disturbance.  Respiratory: Positive for shortness of breath. Negative for cough.   Cardiovascular: Negative for chest pain, palpitations and leg swelling.  Gastrointestinal: Negative for nausea, vomiting, abdominal pain, diarrhea,  constipation, blood in stool and abdominal distention.  Genitourinary: Negative for dysuria, urgency, frequency, hematuria, flank pain, vaginal bleeding, vaginal discharge, difficulty urinating, vaginal pain and pelvic pain.  Musculoskeletal: Positive for arthralgias and gait problem. Negative for joint swelling.  Skin: Negative for rash.  Neurological: Negative for dizziness, syncope, speech difficulty, weakness, numbness and headaches.  Hematological: Negative for adenopathy.  Psychiatric/Behavioral: Negative for behavioral problems, dysphoric mood and agitation. The patient is not nervous/anxious.        Objective:   Physical Exam  Constitutional: She is oriented to person, place, and time. She appears well-developed and well-nourished.  HENT:  Head: Normocephalic.  Right Ear: External ear normal.  Left Ear: External ear normal.  Mouth/Throat: Oropharynx is clear and moist.  Eyes: Conjunctivae and EOM are normal. Pupils are equal, round, and reactive to light.  Neck: Normal range of motion. Neck supple. No thyromegaly present.  Cardiovascular: Normal rate and regular rhythm.   Murmur heard. Grade 1 over 6 systolic murmur  Pulmonary/Chest: Effort normal and breath sounds normal.  Abdominal: Soft. Bowel sounds are normal. She exhibits no mass. There is no tenderness.  Musculoskeletal: Normal range of motion.  Lymphadenopathy:    She has no cervical adenopathy.  Neurological: She is alert and oriented to person, place, and time.  Skin: Skin is warm and dry. No rash noted.  Psychiatric: She has a normal mood and affect. Her behavior is normal.          Assessment & Plan:   Status post [name]-stable Hypertension well controlled Chronic kidney disease Osteoarthritis Status post right hip fracture.  He continues to improve  No change in medical therapy Recheck 6 months with updated lab

## 2014-01-04 NOTE — Patient Instructions (Signed)
Limit your sodium (Salt) intake  Return in 6 months for follow-up  

## 2014-01-04 NOTE — Progress Notes (Signed)
Pre visit review using our clinic review tool, if applicable. No additional management support is needed unless otherwise documented below in the visit note. 

## 2014-02-08 DIAGNOSIS — L570 Actinic keratosis: Secondary | ICD-10-CM | POA: Diagnosis not present

## 2014-02-08 DIAGNOSIS — Z08 Encounter for follow-up examination after completed treatment for malignant neoplasm: Secondary | ICD-10-CM | POA: Diagnosis not present

## 2014-02-08 DIAGNOSIS — Z85828 Personal history of other malignant neoplasm of skin: Secondary | ICD-10-CM | POA: Diagnosis not present

## 2014-02-17 DIAGNOSIS — Z1231 Encounter for screening mammogram for malignant neoplasm of breast: Secondary | ICD-10-CM | POA: Diagnosis not present

## 2014-02-17 LAB — HM MAMMOGRAPHY: HM Mammogram: NEGATIVE

## 2014-03-08 ENCOUNTER — Encounter: Payer: Self-pay | Admitting: Internal Medicine

## 2014-03-10 ENCOUNTER — Encounter (HOSPITAL_COMMUNITY): Payer: Self-pay | Admitting: Cardiovascular Disease

## 2014-03-16 DIAGNOSIS — M25551 Pain in right hip: Secondary | ICD-10-CM | POA: Diagnosis not present

## 2014-03-16 DIAGNOSIS — Z471 Aftercare following joint replacement surgery: Secondary | ICD-10-CM | POA: Diagnosis not present

## 2014-03-16 DIAGNOSIS — Z96641 Presence of right artificial hip joint: Secondary | ICD-10-CM | POA: Diagnosis not present

## 2014-03-23 ENCOUNTER — Encounter: Payer: Self-pay | Admitting: Internal Medicine

## 2014-04-05 DIAGNOSIS — M25551 Pain in right hip: Secondary | ICD-10-CM | POA: Diagnosis not present

## 2014-04-11 DIAGNOSIS — M25551 Pain in right hip: Secondary | ICD-10-CM | POA: Diagnosis not present

## 2014-04-14 DIAGNOSIS — M25551 Pain in right hip: Secondary | ICD-10-CM | POA: Diagnosis not present

## 2014-04-19 DIAGNOSIS — M25551 Pain in right hip: Secondary | ICD-10-CM | POA: Diagnosis not present

## 2014-04-21 DIAGNOSIS — M25551 Pain in right hip: Secondary | ICD-10-CM | POA: Diagnosis not present

## 2014-04-29 DIAGNOSIS — M25551 Pain in right hip: Secondary | ICD-10-CM | POA: Diagnosis not present

## 2014-05-03 ENCOUNTER — Telehealth: Payer: Self-pay | Admitting: Cardiovascular Disease

## 2014-05-03 ENCOUNTER — Encounter: Payer: Self-pay | Admitting: Cardiology

## 2014-05-03 ENCOUNTER — Ambulatory Visit (INDEPENDENT_AMBULATORY_CARE_PROVIDER_SITE_OTHER): Payer: Medicare Other | Admitting: Cardiology

## 2014-05-03 VITALS — BP 138/68 | HR 84 | Ht <= 58 in | Wt 166.8 lb

## 2014-05-03 DIAGNOSIS — E1129 Type 2 diabetes mellitus with other diabetic kidney complication: Secondary | ICD-10-CM | POA: Diagnosis not present

## 2014-05-03 DIAGNOSIS — N183 Chronic kidney disease, stage 3 unspecified: Secondary | ICD-10-CM

## 2014-05-03 DIAGNOSIS — E785 Hyperlipidemia, unspecified: Secondary | ICD-10-CM

## 2014-05-03 DIAGNOSIS — I10 Essential (primary) hypertension: Secondary | ICD-10-CM | POA: Diagnosis not present

## 2014-05-03 DIAGNOSIS — S72001D Fracture of unspecified part of neck of right femur, subsequent encounter for closed fracture with routine healing: Secondary | ICD-10-CM | POA: Diagnosis not present

## 2014-05-03 DIAGNOSIS — N058 Unspecified nephritic syndrome with other morphologic changes: Secondary | ICD-10-CM | POA: Diagnosis not present

## 2014-05-03 DIAGNOSIS — R0789 Other chest pain: Secondary | ICD-10-CM | POA: Diagnosis not present

## 2014-05-03 DIAGNOSIS — I359 Nonrheumatic aortic valve disorder, unspecified: Secondary | ICD-10-CM | POA: Diagnosis not present

## 2014-05-03 DIAGNOSIS — I350 Nonrheumatic aortic (valve) stenosis: Secondary | ICD-10-CM | POA: Diagnosis not present

## 2014-05-03 MED ORDER — ISOSORBIDE MONONITRATE ER 30 MG PO TB24: 30.0000 mg | ORAL_TABLET | Freq: Every day | ORAL | Status: DC

## 2014-05-03 MED ORDER — NITROGLYCERIN 0.4 MG SL SUBL: 0.4000 mg | SUBLINGUAL_TABLET | SUBLINGUAL | Status: DC | PRN

## 2014-05-03 MED ORDER — ISOSORBIDE MONONITRATE ER 30 MG PO TB24: ORAL_TABLET | ORAL | Status: DC

## 2014-05-03 NOTE — Assessment & Plan Note (Signed)
Moderate 2 vessel CAD at cath Aug 2013- 60-70% CFX, 70% RCA- medical Rx

## 2014-05-03 NOTE — Assessment & Plan Note (Signed)
Controlled.  

## 2014-05-03 NOTE — Telephone Encounter (Signed)
I spoke with the pharmacy and changed the order on Rx to Isosorbide MN 30mg  take one by mouth daily.  I called the pt and instructed her to take 1/2 tablet by mouth daily for one week and then increase to a whole tablet daily.  I advised the pt to contact the office if she has issues with headache or any other problems. Pt verbalized understanding of new medication instructions.

## 2014-05-03 NOTE — Telephone Encounter (Signed)
New Msg     Pt c/o medication issue:  1. Name of Medication: Isosorbide Mononitrate (Imdur)  2. How are you currently taking this medication (dosage and times per day)? ???  3. Are you having a reaction (difficulty breathing--STAT)? no  4. What is your medication issue? [facility] pharmacy calling, would like instructions on how pt should take medication.   Please return call to [phone].

## 2014-05-03 NOTE — Assessment & Plan Note (Signed)
S/P [name] in [facility] 04/29/12

## 2014-05-03 NOTE — Assessment & Plan Note (Signed)
Pt was seen in the office today for chest pain

## 2014-05-03 NOTE — Assessment & Plan Note (Signed)
SCr 1.03 Aug 2013

## 2014-05-03 NOTE — Patient Instructions (Signed)
Your physician has recommended you make the following change in your medication:   Start Imdur 15 mg 1/2 tablet daily by mouth for a week, if tolerating with no headache take 30 mg 1 tablet daily by mouth  Start Nitroglycerin 0.4 mg tablet as needed for chest pain, pressure, and tightness, take one every 5 minutes if pain does not stop, do not take more then three  Your physician recommends that you schedule a follow-up appointment in: two weeks with Dr. Burt Knack.

## 2014-05-03 NOTE — Assessment & Plan Note (Signed)
Chronic. 

## 2014-05-03 NOTE — Progress Notes (Signed)
05/03/2014 Sara Garza   1923/07/01  [id number]  Primary Physician Nyoka Cowden, MD Primary Cardiologist: Dr Burt Knack  HPI:  81 year old female followed by Dr Burt Knack with a history of CAD and AS. The patient has a history of a [name] at [facility] in  Jan 2014. She was treated with a 23 mm Medtronic Corevalve through the moderate risk SurTAVI aortic stenosis trial. She also is followed for moderate 2 vessel coronary artery disease which was evaluated  In 2013 with pressure wire analysis and found to be nonflow obstructive. She is in the office today for complaints of chest pain.             The pt apparently fell this past May and fractured her hip. She has been in rehab using an exercise bike and complained of mid sternal chest pain. They suggested she be evaluated here. The pt described localized, lower sternal discomfort, not exertional, tender to palpation, and associated with no radiation.    Current Outpatient Prescriptions  Medication Sig Dispense Refill  . amLODipine (NORVASC) 5 MG tablet Take 1 tablet (5 mg total) by mouth daily. 30 tablet 11  . amoxicillin (AMOXIL) 500 MG capsule TAKE 4 TABLETS ONE HOUR PRIOR TO DENTAL PROCEDURES 8 capsule 2  . aspirin EC 81 MG tablet Take 1 tablet (81 mg total) by mouth daily. Start after lovenox injections are done after 2 weeks    . bimatoprost (LUMIGAN) 0.01 % SOLN Place 1 drop into both eyes at bedtime.     . Calcium Carb-Cholecalciferol (CALCIUM 500 +D) 500-400 MG-UNIT TABS Take 1 tablet by mouth daily.     [name] ezetimibe (ZETIA) 10 MG tablet Take 1 tablet (10 mg total) by mouth daily. 90 tablet 3  . Multiple Vitamin (MULTIVITAMIN) tablet Take 1 tablet by mouth daily.      . isosorbide mononitrate (IMDUR) 30 MG 24 hr tablet 1/5 a pill for a week, if tolerating with no headache increase to 30 mg daily. 90 tablet 3  . nitroGLYCERIN (NITROSTAT) 0.4 MG SL tablet Place 1 tablet (0.4 mg total) under the tongue every 5 (five)  minutes as needed for chest pain (chest pressure and tightness). 25 tablet 1   No current facility-administered medications for this visit.    Allergies  Allergen Reactions  . Statins Other (See Comments)    Muscle aches    History   Social History  . Marital Status: Widowed    Spouse Name: N/A    Number of Children: N/A  . Years of Education: N/A   Occupational History  . Not on file.   Social History Main Topics  . Smoking status: Never Smoker   . Smokeless tobacco: Never Used  . Alcohol Use: Yes     Comment: very seldom  . Drug Use: No  . Sexual Activity: Not Currently   Other Topics Concern  . Not on file   Social History Narrative     Review of Systems: General: negative for chills, fever, night sweats or weight changes.  Cardiovascular: negative for chest pain, dyspnea on exertion, edema, orthopnea, palpitations, paroxysmal nocturnal dyspnea or shortness of breath Dermatological: negative for rash Respiratory: negative for cough or wheezing Urologic: negative for hematuria Abdominal: negative for nausea, vomiting, diarrhea, bright red blood per rectum, melena, or hematemesis Neurologic: negative for visual changes, syncope, or dizziness All other systems reviewed and are otherwise negative except as noted above.    Blood pressure 138/68, pulse 84, height 4'  10" (1.473 m), weight 166 lb 12.8 oz (75.66 kg).  General appearance: alert, cooperative and no distress Lungs: clear to auscultation bilaterally Heart: regular rate and rhythm and 2/6 systolic murmur  EKG NSR LBBB  ASSESSMENT AND PLAN:   CHEST PAIN, ATYPICAL Pt was seen in the office today for chest pain   CAD (coronary artery disease) Moderate 2 vessel CAD at cath Aug 2013- 60-70% CFX, 70% RCA- medical Rx   Severe calcific aortic stenosis S/P [name] in [facility] 04/29/12   Type 2 diabetes mellitus with renal manifestations, controlled Followed by PCP   Essential  hypertension Controlled   Dyslipidemia Statin intolerance   CKD (chronic kidney disease), stage III SCr 1.03 Aug 2013   GLAUCOMA Chronic   Fracture of hip, right, closed S/P THR May 2015    PLAN  She came to the office today to be reassured that her symptoms were not cardiac. I told her I did not think her symptoms were anginal but she has known CAD and risk factors for progression. I added low dose Imdur. I would like her to f/u with Dr Burt Knack in a few weeks. If her symptoms are improved, fine. If not, or if they begin to sound more anginal in nature she may need cath or further adjustments of her medications. She is to see Dr Vernona Rieger in [place] if a couple of weeks. We discussed typical anginal symptoms and the use of NTG SL prn.   [facility] 05/03/2014 4:48 PM

## 2014-05-03 NOTE — Assessment & Plan Note (Signed)
Followed by PCP

## 2014-05-03 NOTE — Assessment & Plan Note (Signed)
Statin intolerance 

## 2014-05-03 NOTE — Assessment & Plan Note (Signed)
S/P [facility] May 2015

## 2014-05-06 DIAGNOSIS — M25551 Pain in right hip: Secondary | ICD-10-CM | POA: Diagnosis not present

## 2014-06-29 ENCOUNTER — Other Ambulatory Visit: Payer: Self-pay | Admitting: Cardiovascular Disease

## 2014-07-06 ENCOUNTER — Ambulatory Visit (INDEPENDENT_AMBULATORY_CARE_PROVIDER_SITE_OTHER): Payer: Medicare Other | Admitting: Internal Medicine

## 2014-07-06 ENCOUNTER — Encounter: Payer: Self-pay | Admitting: Internal Medicine

## 2014-07-06 VITALS — BP 150/72 | HR 83 | Temp 97.9°F | Resp 20 | Ht <= 58 in | Wt 170.0 lb

## 2014-07-06 DIAGNOSIS — I10 Essential (primary) hypertension: Secondary | ICD-10-CM | POA: Diagnosis not present

## 2014-07-06 DIAGNOSIS — I350 Nonrheumatic aortic (valve) stenosis: Secondary | ICD-10-CM | POA: Diagnosis not present

## 2014-07-06 DIAGNOSIS — E1129 Type 2 diabetes mellitus with other diabetic kidney complication: Secondary | ICD-10-CM | POA: Diagnosis not present

## 2014-07-06 DIAGNOSIS — N058 Unspecified nephritic syndrome with other morphologic changes: Secondary | ICD-10-CM

## 2014-07-06 DIAGNOSIS — E1142 Type 2 diabetes mellitus with diabetic polyneuropathy: Secondary | ICD-10-CM

## 2014-07-06 MED ORDER — AMLODIPINE BESYLATE 5 MG PO TABS: 5.0000 mg | ORAL_TABLET | Freq: Every day | ORAL | Status: DC

## 2014-07-06 NOTE — Patient Instructions (Signed)
Limit your sodium (Salt) intake  Return in 6 months for follow-up  

## 2014-07-06 NOTE — Progress Notes (Signed)
Subjective:    Patient ID: Sara Garza, female    DOB: 22-Aug-1923, 81 y.o.   MRN: [id number]  HPI  81 year old patient who is followed by cardiology due to coronary artery disease and calcific aortic stenosis.  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  She has essential hypertension and dyslipidemia. She is doing quite well today.  No concerns or complaints. Medical regimen reviewed  Past Medical History  Diagnosis Date  . Hyperlipidemia   . Hypertension   . Osteoporosis   . Diabetes mellitus   . Arthritis   . Carotid stenosis, left     50-60% stable    History   Social History  . Marital Status: Widowed    Spouse Name: N/A  . Number of Children: N/A  . Years of Education: N/A   Occupational History  . Not on file.   Social History Main Topics  . Smoking status: Never Smoker   . Smokeless tobacco: Never Used  . Alcohol Use: Yes     Comment: very seldom  . Drug Use: No  . Sexual Activity: Not Currently   Other Topics Concern  . Not on file   Social History Narrative    Past Surgical History  Procedure Laterality Date  . Cesarean section    . Appendectomy    . Foot surgery      [name]  . Cardiac valve replacement  04/29/2012  . Hip arthroplasty Right 07/31/2013    Procedure: ARTHROPLASTY BIPOLAR HIP;  Surgeon: [name], MD;  Location: [facility];  Service: Orthopedics;  Laterality: Right;  . Percutaneous coronary stent intervention (pci-s) N/A 01/02/2012    Procedure: PERCUTANEOUS CORONARY STENT INTERVENTION (PCI-S);  Surgeon: Sherren Mocha, MD;  Location: [facility] CATH LAB;  Service: Cardiovascular;  Laterality: N/A;    Family History  Problem Relation Age of Onset  . Lung cancer    . COPD Father   . Cancer Father     lung cancer    Allergies  Allergen Reactions  . Statins Other (See Comments)    Muscle aches    Current Outpatient Prescriptions on File Prior to Visit  Medication Sig Dispense Refill  . amoxicillin (AMOXIL) 500 MG  capsule TAKE 4 TABLETS ONE HOUR PRIOR TO DENTAL PROCEDURES 8 capsule 2  . aspirin EC 81 MG tablet Take 1 tablet (81 mg total) by mouth daily. Start after lovenox injections are done after 2 weeks    . bimatoprost (LUMIGAN) 0.01 % SOLN Place 1 drop into both eyes at bedtime.     . Calcium Carb-Cholecalciferol (CALCIUM 500 +D) 500-400 MG-UNIT TABS Take 1 tablet by mouth daily.     . isosorbide mononitrate (IMDUR) 30 MG 24 hr tablet Take 1 tablet (30 mg total) by mouth daily. 90 tablet 3  . Multiple Vitamin (MULTIVITAMIN) tablet Take 1 tablet by mouth daily.      . nitroGLYCERIN (NITROSTAT) 0.4 MG SL tablet Place 1 tablet (0.4 mg total) under the tongue every 5 (five) minutes as needed for chest pain (chest pressure and tightness). 25 tablet 1  . ZETIA 10 MG tablet TAKE 1 TABLET BY MOUTH ONCE DAILY 90 tablet 1   No current facility-administered medications on file prior to visit.    BP 150/72 mmHg  Pulse 83  Temp(Src) 97.9 F (36.6 C) (Oral)  Resp 20  Ht 4\' 10"  (1.473 m)  Wt 170 lb (77.111 kg)  BMI 35.54 kg/m2  SpO2 96%     Review  of Systems  Constitutional: Negative.   HENT: Negative for congestion, dental problem, hearing loss, rhinorrhea, sinus pressure, sore throat and tinnitus.   Eyes: Negative for pain, discharge and visual disturbance.  Respiratory: Negative for cough and shortness of breath.   Cardiovascular: Positive for leg swelling. Negative for chest pain and palpitations.  Gastrointestinal: Negative for nausea, vomiting, abdominal pain, diarrhea, constipation, blood in stool and abdominal distention.  Genitourinary: Negative for dysuria, urgency, frequency, hematuria, flank pain, vaginal bleeding, vaginal discharge, difficulty urinating, vaginal pain and pelvic pain.  Musculoskeletal: Negative for joint swelling, arthralgias and gait problem.  Skin: Negative for rash.  Neurological: Negative for dizziness, syncope, speech difficulty, weakness, numbness and headaches.    Hematological: Negative for adenopathy.  Psychiatric/Behavioral: Negative for behavioral problems, dysphoric mood and agitation. The patient is not nervous/anxious.        Objective:   Physical Exam  Constitutional: She is oriented to person, place, and time. She appears well-developed and well-nourished.  HENT:  Head: Normocephalic.  Right Ear: External ear normal.  Left Ear: External ear normal.  Mouth/Throat: Oropharynx is clear and moist.  Eyes: Conjunctivae and EOM are normal. Pupils are equal, round, and reactive to light.  Neck: Normal range of motion. Neck supple. No thyromegaly present.  Bilateral carotid bruits versus transmitted murmur  Cardiovascular: Normal rate, regular rhythm and intact distal pulses.   Murmur heard. Pulmonary/Chest: Effort normal and breath sounds normal.  Abdominal: Soft. Bowel sounds are normal. She exhibits no mass. There is no tenderness.  Musculoskeletal: Normal range of motion. She exhibits edema.  Lymphadenopathy:    She has no cervical adenopathy.  Neurological: She is alert and oriented to person, place, and time.  Skin: Skin is warm and dry. No rash noted.  Psychiatric: She has a normal mood and affect. Her behavior is normal.          Assessment & Plan:   Essential hypertension.  Stable.  Repeat blood pressure 140/80 Aortic stenosis.  Follow-up cardiology Coronary artery disease, stable Diabetes mellitus.  Will check a hemoglobin A1c

## 2014-07-06 NOTE — Progress Notes (Signed)
Pre visit review using our clinic review tool, if applicable. No additional management support is needed unless otherwise documented below in the visit note. 

## 2014-07-13 ENCOUNTER — Encounter: Payer: Self-pay | Admitting: Cardiovascular Disease

## 2014-07-13 ENCOUNTER — Ambulatory Visit (INDEPENDENT_AMBULATORY_CARE_PROVIDER_SITE_OTHER): Payer: Medicare Other | Admitting: Cardiovascular Disease

## 2014-07-13 VITALS — BP 138/64 | HR 78 | Ht <= 58 in | Wt 168.0 lb

## 2014-07-13 DIAGNOSIS — I208 Other forms of angina pectoris: Secondary | ICD-10-CM | POA: Diagnosis not present

## 2014-07-13 DIAGNOSIS — I359 Nonrheumatic aortic valve disorder, unspecified: Secondary | ICD-10-CM | POA: Diagnosis not present

## 2014-07-13 NOTE — Patient Instructions (Signed)
Your physician recommends that you continue on your current medications as directed. Please refer to the Current Medication list given to you today.  Your physician wants you to follow-up in: 6 MONTHS with Dr Burt Knack.  You will receive a reminder letter in the mail two months in advance. If you don't receive a letter, please call our office to schedule the follow-up appointment.

## 2014-07-13 NOTE — Progress Notes (Signed)
Cardiology Office Note   Date:  07/13/2014   ID:  Sara Garza, DOB [birth year], MRN [place]:[id number]  PCP:  Nyoka Cowden, MD  Cardiologist:  Sherren Mocha, MD    Chief Complaint  Patient presents with  . Shortness of Breath    History of Present Illness: Sara Garza is a 81 y.o. female who presents for follow-up evaluation. She underwent for [name] for treatment of severe symptomatic aortic stenosis in 2014. She was treated with a 23 mm Medtronic Corevalve through the moderate risk SurTAVI aortic stenosis trial. She also is followed for moderate coronary artery disease which was evaluated with pressure wire analysis of the RCA and LCx demonstrating no hemodynamic evidence of flow-obstruction.  The patient is here today for routine follow-up. States she's most limited by hip pain. She had hip surgery last year after a fall. She has exertional dyspnea with walking on level ground, but she really hasn't done much since her hip injury. She experienced chest discomfort with physical exertion while she was using a stationary bike during her rehab.  She has not had any anginal symptoms with her routine daily activities.  Past Medical History  Diagnosis Date  . Hyperlipidemia   . Hypertension   . Osteoporosis   . Diabetes mellitus   . Arthritis   . Carotid stenosis, left     50-60% stable    Past Surgical History  Procedure Laterality Date  . Cesarean section    . Appendectomy    . Foot surgery      [name]  . Cardiac valve replacement  04/29/2012  . Hip arthroplasty Right 07/31/2013    Procedure: ARTHROPLASTY BIPOLAR HIP;  Surgeon: [name], MD;  Location: [facility];  Service: Orthopedics;  Laterality: Right;  . Percutaneous coronary stent intervention (pci-s) N/A 01/02/2012    Procedure: PERCUTANEOUS CORONARY STENT INTERVENTION (PCI-S);  Surgeon: Sherren Mocha, MD;  Location: [facility] CATH LAB;  Service: Cardiovascular;  Laterality: N/A;    Current Outpatient Prescriptions    Medication Sig Dispense Refill  . amLODipine (NORVASC) 5 MG tablet Take 1 tablet (5 mg total) by mouth daily. 30 tablet 5  . amoxicillin (AMOXIL) 500 MG capsule TAKE 4 TABLETS ONE HOUR PRIOR TO DENTAL PROCEDURES 8 capsule 2  . aspirin EC 81 MG tablet Take 1 tablet (81 mg total) by mouth daily. Start after lovenox injections are done after 2 weeks    . bimatoprost (LUMIGAN) 0.01 % SOLN Place 1 drop into both eyes at bedtime.     . Calcium Carb-Cholecalciferol (CALCIUM 500 +D) 500-400 MG-UNIT TABS Take 1 tablet by mouth daily.     . isosorbide mononitrate (IMDUR) 30 MG 24 hr tablet Take 1 tablet (30 mg total) by mouth daily. 90 tablet 3  . Multiple Vitamin (MULTIVITAMIN) tablet Take 1 tablet by mouth daily.      . nitroGLYCERIN (NITROSTAT) 0.4 MG SL tablet Place 1 tablet (0.4 mg total) under the tongue every 5 (five) minutes as needed for chest pain (chest pressure and tightness). 25 tablet 1  . ZETIA 10 MG tablet TAKE 1 TABLET BY MOUTH ONCE DAILY 90 tablet 1   No current facility-administered medications for this visit.    Allergies:   Statins   Social History:  The patient  reports that she has been passively smoking.  She has never used smokeless tobacco. She reports that she drinks alcohol. She reports that she does not use illicit drugs.   Family History:  The patient's family  history includes COPD in her father; Cancer in her father; Lung cancer in an other family member.    ROS:  Please see the history of present illness.  Otherwise, review of systems is positive for hearing loss, hip pain.  All other systems are reviewed and negative.    PHYSICAL EXAM: VS:  BP 138/64 mmHg  Pulse 78  Ht 4\' 10"  (1.473 m)  Wt 168 lb (76.204 kg)  BMI 35.12 kg/m2  SpO2 97% , BMI Body mass index is 35.12 kg/(m^2). GEN: Well nourished, well developed, pleasant elderly woman in no acute distress HEENT: normal Neck: no JVD, no masses. No carotid bruits Cardiac: RRR with grade 1/6 early peaking  systolic ejection murmur best heard at the right upper sternal border                Respiratory:  clear to auscultation bilaterally, normal work of breathing GI: soft, nontender, nondistended, + BS MS: no deformity or atrophy Ext: 1+ pretibial edema, pedal pulses 2+= bilaterally Skin: warm and dry, no rash Neuro:  Strength and sensation are intact Psych: euthymic mood, full affect  EKG:  EKG is not ordered today.  Recent Labs: 08/02/2013: BUN 33*; Creatinine 1.50*; Potassium 4.8; Sodium 138 09/20/2013: Hemoglobin 10.9*; Platelets 209.0 09/28/2013: ALT 15   Lipid Panel     Component Value Date/Time   CHOL 186 09/28/2013 0843   TRIG 117.0 09/28/2013 0843   HDL 42.70 09/28/2013 0843   CHOLHDL 4 09/28/2013 0843   VLDL 23.4 09/28/2013 0843   LDLCALC 120* 09/28/2013 0843   LDLDIRECT 158.6 10/23/2012 1223      Wt Readings from Last 3 Encounters:  07/13/14 168 lb (76.204 kg)  07/06/14 170 lb (77.111 kg)  05/03/14 166 lb 12.8 oz (75.66 kg)     ASSESSMENT AND PLAN: 1.  Aortic valve disorder status post [name]: the patient's physical exam remains stable and is suggestive of normal valve function. She has New York Heart Association functional class II symptoms. She had a recent evaluation in [place] and we will request those records. An echocardiogram was done during part of that evaluation in February.  2. Essential hypertension: blood pressure is well controlled on amlodipine and isosorbide. Leg swelling may be a result of amlodipine.  3. Coronary artery disease, native vessel: CCS class II anginal symptoms. She's had some improvement with isosorbide. Will continue him. If she becomes more limited more angina worsens, could consider repeat cardiac catheterization knowing that she has two-vessel coronary artery disease. Hopefully she will continue to do well with medical therapy. Recommended that she increase her walking as tolerated.  4. Hyperlipidemia: statin intolerant. She continues  on Zetia.   Current medicines are reviewed with the patient today.  The patient does not have concerns regarding medicines.  The following changes have been made:  no change  Labs/ tests ordered today include:  No orders of the defined types were placed in this encounter.    Disposition:   FU 6 months  Signed, Sherren Mocha, MD  07/13/2014 2:02 PM    [address], [place], [place]  [address] Phone: [phone]; Fax: [phone]

## 2014-07-21 DIAGNOSIS — E119 Type 2 diabetes mellitus without complications: Secondary | ICD-10-CM | POA: Diagnosis not present

## 2014-07-21 DIAGNOSIS — H4011X2 Primary open-angle glaucoma, moderate stage: Secondary | ICD-10-CM | POA: Diagnosis not present

## 2014-07-21 DIAGNOSIS — H1131 Conjunctival hemorrhage, right eye: Secondary | ICD-10-CM | POA: Diagnosis not present

## 2014-07-21 DIAGNOSIS — H3531 Nonexudative age-related macular degeneration: Secondary | ICD-10-CM | POA: Diagnosis not present

## 2014-08-28 DIAGNOSIS — Z23 Encounter for immunization: Secondary | ICD-10-CM | POA: Diagnosis not present

## 2014-09-26 ENCOUNTER — Other Ambulatory Visit: Payer: Self-pay

## 2014-10-11 DIAGNOSIS — Z96641 Presence of right artificial hip joint: Secondary | ICD-10-CM | POA: Diagnosis not present

## 2014-10-11 DIAGNOSIS — M25551 Pain in right hip: Secondary | ICD-10-CM | POA: Diagnosis not present

## 2014-10-11 DIAGNOSIS — Z471 Aftercare following joint replacement surgery: Secondary | ICD-10-CM | POA: Diagnosis not present

## 2014-10-20 DIAGNOSIS — M25551 Pain in right hip: Secondary | ICD-10-CM | POA: Diagnosis not present

## 2014-10-26 DIAGNOSIS — M25551 Pain in right hip: Secondary | ICD-10-CM | POA: Diagnosis not present

## 2014-10-28 DIAGNOSIS — M25551 Pain in right hip: Secondary | ICD-10-CM | POA: Diagnosis not present

## 2014-11-01 DIAGNOSIS — M25551 Pain in right hip: Secondary | ICD-10-CM | POA: Diagnosis not present

## 2014-11-03 DIAGNOSIS — M25551 Pain in right hip: Secondary | ICD-10-CM | POA: Diagnosis not present

## 2014-11-08 DIAGNOSIS — M25551 Pain in right hip: Secondary | ICD-10-CM | POA: Diagnosis not present

## 2014-11-11 DIAGNOSIS — M25551 Pain in right hip: Secondary | ICD-10-CM | POA: Diagnosis not present

## 2014-11-15 DIAGNOSIS — M25551 Pain in right hip: Secondary | ICD-10-CM | POA: Diagnosis not present

## 2014-11-29 DIAGNOSIS — M25551 Pain in right hip: Secondary | ICD-10-CM | POA: Diagnosis not present

## 2014-12-01 DIAGNOSIS — H3531 Nonexudative age-related macular degeneration: Secondary | ICD-10-CM | POA: Diagnosis not present

## 2014-12-01 DIAGNOSIS — H4011X2 Primary open-angle glaucoma, moderate stage: Secondary | ICD-10-CM | POA: Diagnosis not present

## 2014-12-01 DIAGNOSIS — H43813 Vitreous degeneration, bilateral: Secondary | ICD-10-CM | POA: Diagnosis not present

## 2014-12-01 DIAGNOSIS — E119 Type 2 diabetes mellitus without complications: Secondary | ICD-10-CM | POA: Diagnosis not present

## 2014-12-01 LAB — HM DIABETES EYE EXAM

## 2014-12-06 ENCOUNTER — Encounter: Payer: Self-pay | Admitting: Internal Medicine

## 2014-12-22 ENCOUNTER — Encounter: Payer: Self-pay | Admitting: Cardiovascular Disease

## 2014-12-22 ENCOUNTER — Ambulatory Visit (INDEPENDENT_AMBULATORY_CARE_PROVIDER_SITE_OTHER): Payer: Medicare Other | Admitting: Cardiovascular Disease

## 2014-12-22 ENCOUNTER — Ambulatory Visit: Payer: PRIVATE HEALTH INSURANCE | Admitting: Cardiovascular Disease

## 2014-12-22 VITALS — BP 142/80 | HR 84 | Ht 60.0 in | Wt 169.0 lb

## 2014-12-22 DIAGNOSIS — N183 Chronic kidney disease, stage 3 unspecified: Secondary | ICD-10-CM

## 2014-12-22 DIAGNOSIS — I25119 Atherosclerotic heart disease of native coronary artery with unspecified angina pectoris: Secondary | ICD-10-CM

## 2014-12-22 DIAGNOSIS — I509 Heart failure, unspecified: Secondary | ICD-10-CM

## 2014-12-22 DIAGNOSIS — I110 Hypertensive heart disease with heart failure: Secondary | ICD-10-CM

## 2014-12-22 DIAGNOSIS — I208 Other forms of angina pectoris: Secondary | ICD-10-CM | POA: Diagnosis not present

## 2014-12-22 DIAGNOSIS — I350 Nonrheumatic aortic (valve) stenosis: Secondary | ICD-10-CM

## 2014-12-22 DIAGNOSIS — I10 Essential (primary) hypertension: Secondary | ICD-10-CM

## 2014-12-22 NOTE — Patient Instructions (Signed)
Medication Instructions:   Your physician recommends that you continue on your current medications as directed. Please refer to the Current Medication list given to you today.   Labwork:  NONE ORDER TODAY    Testing/Procedures:  Your physician has requested that you have an echocardiogram. IN 6 MONTHS  Echocardiography is a painless test that uses sound waves to create images of your heart. It provides your doctor with information about the size and shape of your heart and how well your heart's chambers and valves are working. This procedure takes approximately one hour. There are no restrictions for this procedure.      Follow-Up:   Your physician wants you to follow-up in:  IN [place] will receive a reminder letter in the mail two months in advance. If you don't receive a letter, please call our office to schedule the follow-up appointment.      Any Other Special Instructions Will Be Listed Below (If Applicable).

## 2014-12-22 NOTE — Progress Notes (Signed)
Cardiology Office Note Date:  12/22/2014   ID:  Sara Garza, DOB 1923/11/16, MRN [name]  PCP:  Sara Cowden, MD  Cardiologist:  Sara Mocha, MD    Chief Complaint  Patient presents with  . Shortness of Breath    History of Present Illness: Sara Garza is a 81 y.o. female who presents for  Follow-up evaluation.  She underwent for [name] for treatment of severe symptomatic aortic stenosis in 2014. She was treated with a 23 mm Medtronic Corevalve through the moderate risk SurTAVI aortic stenosis trial. She also is followed for moderate coronary artery disease which was evaluated with pressure wire analysis of the RCA and LCx demonstrating no hemodynamic evidence of flow-obstruction.   The patient complains of shortness of breath with activity. She denies orthopnea , PND, or leg swelling. She hasn't been as active as in the past. She had a hip fracture last year after a fall and required surgery. She denies chest pain or pressure with activity.   Past Medical History  Diagnosis Date  . Hyperlipidemia   . Hypertension   . Osteoporosis   . Diabetes mellitus   . Arthritis   . Carotid stenosis, left     50-60% stable    Past Surgical History  Procedure Laterality Date  . Cesarean section    . Appendectomy    . Foot surgery      [name]  . Cardiac valve replacement  04/29/2012  . Hip arthroplasty Right 07/31/2013    Procedure: ARTHROPLASTY BIPOLAR HIP;  Surgeon: Sara Pole, MD;  Location: [facility];  Service: Orthopedics;  Laterality: Right;  . Percutaneous coronary stent intervention (pci-s) N/A 01/02/2012    Procedure: PERCUTANEOUS CORONARY STENT INTERVENTION (PCI-S);  Surgeon: Sara Mocha, MD;  Location: [facility] CATH LAB;  Service: Cardiovascular;  Laterality: N/A;    Current Outpatient Prescriptions  Medication Sig Dispense Refill  . amLODipine (NORVASC) 5 MG tablet Take 1 tablet (5 mg total) by mouth daily. 30 tablet 5  . amoxicillin (AMOXIL) 500 MG  capsule TAKE 4 TABLETS ONE HOUR PRIOR TO DENTAL PROCEDURES 8 capsule 2  . aspirin EC 81 MG tablet Take 1 tablet (81 mg total) by mouth daily. Start after lovenox injections are done after 2 weeks    . bimatoprost (LUMIGAN) 0.01 % SOLN Place 1 drop into both eyes at bedtime.     . Calcium Carb-Cholecalciferol (CALCIUM 500 +D) 500-400 MG-UNIT TABS Take 1 tablet by mouth daily.     . isosorbide mononitrate (IMDUR) 30 MG 24 hr tablet Take 1 tablet (30 mg total) by mouth daily. 90 tablet 3  . Multiple Vitamin (MULTIVITAMIN) tablet Take 1 tablet by mouth daily.      . nitroGLYCERIN (NITROSTAT) 0.4 MG SL tablet Place 1 tablet (0.4 mg total) under the tongue every 5 (five) minutes as needed for chest pain (chest pressure and tightness). 25 tablet 1  . ZETIA 10 MG tablet TAKE 1 TABLET BY MOUTH ONCE DAILY 90 tablet 1   No current facility-administered medications for this visit.    Allergies:   Statins   Social History:  The patient  reports that she has been passively smoking.  She has never used smokeless tobacco. She reports that she drinks alcohol. She reports that she does not use illicit drugs.   Family History:  The patient's  family history includes COPD in her father; Cancer in her father; Lung cancer in an other family member.    ROS:  Please see  the history of present illness.  Otherwise, review of systems is positive for  Hearing loss, shortness of breath with activity, snoring, balance problems.  All other systems are reviewed and negative.   PHYSICAL EXAM: VS:  BP 142/80 mmHg  Pulse 84  Ht 5' (1.524 m)  Wt 169 lb (76.658 kg)  BMI 33.01 kg/m2 , BMI Body mass index is 33.01 kg/(m^2). GEN: Well nourished, well developed, in no acute distress HEENT: normal Neck: no JVD, no masses. No carotid bruits Cardiac: RRR with grade 2/6 systolic ejection murmur at the left sternal border             Respiratory:  clear to auscultation bilaterally, normal work of breathing GI: soft, nontender,  nondistended, + BS MS: no deformity or atrophy Ext: trace pretibial edema, pedal pulses 2+= bilaterally Skin: warm and dry, no rash Neuro:  Strength and sensation are intact Psych: euthymic mood, full affect  EKG:  EKG is ordered today. The ekg ordered today shows  Normal sinus rhythm with left bundle branch block , heart rate 84 bpm  Recent Labs: No results found for requested labs within last 365 days.   Lipid Panel     Component Value Date/Time   CHOL 186 09/28/2013 0843   TRIG 117.0 09/28/2013 0843   HDL 42.70 09/28/2013 0843   CHOLHDL 4 09/28/2013 0843   VLDL 23.4 09/28/2013 0843   LDLCALC 120* 09/28/2013 0843   LDLDIRECT 158.6 10/23/2012 1223      Wt Readings from Last 3 Encounters:  12/22/14 169 lb (76.658 kg)  07/13/14 168 lb (76.204 kg)  07/06/14 170 lb (77.111 kg)     ASSESSMENT AND PLAN: 1.   Aortic valve disease status post [name]: The patient has New York Heart Association functional class 3 symptoms. I suspect this is multifactorial in the setting of her advanced age , obesity, deconditioning, and chronic diastolic heart failure. I reviewed her echo from February of this year. This demonstrated normal gradients across her aortic valve, normal LV function, and mild to moderate paravalvular insufficiency. I do not appreciate a diastolic murmur on her physical exam. Will repeat an echocardiogram before her follow-up visit in 6 months.   2. Chronic diastolic heart failure: encouraged increase activity with focus on weight loss. Follow-up echo prior to her next return visit. We discussed adding a low-dose diuretic, but she does not think she can tolerate this because of frequent urination and nocturia.  3. CAD, native vessel: stable without angina  4. Essential HTN with diastolic heart failure: overall stable on amlodipine and isosorbide  Current medicines are reviewed with the patient today.  The patient does not have concerns regarding medicines.  Labs/ tests  ordered today include:   Orders Placed This Encounter  Procedures  . EKG 12-Lead  . Echocardiogram   Disposition:   FU 6 months with an echocardiogram before that visit  Signed, Sara Mocha, MD  12/22/2014 5:46 PM    [facility] [name], [place], [place]  [address] Phone: [phone]; Fax: [phone]

## 2014-12-23 DIAGNOSIS — S92332A Displaced fracture of third metatarsal bone, left foot, initial encounter for closed fracture: Secondary | ICD-10-CM | POA: Diagnosis not present

## 2014-12-23 DIAGNOSIS — M25562 Pain in left knee: Secondary | ICD-10-CM | POA: Diagnosis not present

## 2014-12-23 DIAGNOSIS — S92342A Displaced fracture of fourth metatarsal bone, left foot, initial encounter for closed fracture: Secondary | ICD-10-CM | POA: Diagnosis not present

## 2014-12-23 DIAGNOSIS — S92322D Displaced fracture of second metatarsal bone, left foot, subsequent encounter for fracture with routine healing: Secondary | ICD-10-CM | POA: Diagnosis not present

## 2014-12-23 DIAGNOSIS — S92322A Displaced fracture of second metatarsal bone, left foot, initial encounter for closed fracture: Secondary | ICD-10-CM | POA: Diagnosis not present

## 2014-12-23 DIAGNOSIS — M25572 Pain in left ankle and joints of left foot: Secondary | ICD-10-CM | POA: Diagnosis not present

## 2014-12-26 ENCOUNTER — Telehealth: Payer: Self-pay | Admitting: Cardiovascular Disease

## 2014-12-26 NOTE — Telephone Encounter (Signed)
New message    Patient calling aware that nurse Ander Purpura is out out    [place] again on Thursday night broke 3 toes.  [facility] requesting her to take aleve for pain.    Pt c/o medication issue:  1. Name of Medication: aleve for pain   2. How are you currently taking this medication (dosage and times per day)? For pain due to broke toes  3. Are you having a reaction (difficulty breathing--STAT)? Broke 3 toes  4. What is your medication issue? Patient is aware that Dr. Burt Knack does not want her to take aleve - any suggestion on other medication to take for pain.

## 2014-12-26 NOTE — Telephone Encounter (Signed)
Pt told that she can take Tylenol for her pain every 6-8 hours not to exceed 4000 mg in a 24 hour period. Pt educated it is best to stay on a pain medication schedule not to wait until she is in pain before she starts taking medication.  Pt to try this for awhile and if she notices it is not working to call her ortho MD and see if they are able to evaluate for a different medication. Pt also reminded to elevate and ice as instructed by the ortho MD. Pt verbalized understanding no additional questions at this time.

## 2014-12-30 DIAGNOSIS — S92322A Displaced fracture of second metatarsal bone, left foot, initial encounter for closed fracture: Secondary | ICD-10-CM | POA: Diagnosis not present

## 2014-12-30 DIAGNOSIS — S92342A Displaced fracture of fourth metatarsal bone, left foot, initial encounter for closed fracture: Secondary | ICD-10-CM | POA: Diagnosis not present

## 2014-12-30 DIAGNOSIS — S92332A Displaced fracture of third metatarsal bone, left foot, initial encounter for closed fracture: Secondary | ICD-10-CM | POA: Diagnosis not present

## 2014-12-30 DIAGNOSIS — M25572 Pain in left ankle and joints of left foot: Secondary | ICD-10-CM | POA: Diagnosis not present

## 2014-12-30 DIAGNOSIS — M25562 Pain in left knee: Secondary | ICD-10-CM | POA: Diagnosis not present

## 2015-01-04 ENCOUNTER — Other Ambulatory Visit (INDEPENDENT_AMBULATORY_CARE_PROVIDER_SITE_OTHER): Payer: Medicare Other

## 2015-01-04 DIAGNOSIS — I10 Essential (primary) hypertension: Secondary | ICD-10-CM | POA: Diagnosis not present

## 2015-01-04 DIAGNOSIS — E1142 Type 2 diabetes mellitus with diabetic polyneuropathy: Secondary | ICD-10-CM | POA: Diagnosis not present

## 2015-01-04 DIAGNOSIS — E785 Hyperlipidemia, unspecified: Secondary | ICD-10-CM

## 2015-01-04 DIAGNOSIS — Z0000 Encounter for general adult medical examination without abnormal findings: Secondary | ICD-10-CM

## 2015-01-04 LAB — CBC WITH DIFFERENTIAL/PLATELET
BASOS ABS: 0 10*3/uL (ref 0.0–0.1)
Basophils Relative: 0.5 % (ref 0.0–3.0)
EOS PCT: 4.2 % (ref 0.0–5.0)
Eosinophils Absolute: 0.3 10*3/uL (ref 0.0–0.7)
HCT: 36.1 % (ref 36.0–46.0)
HEMOGLOBIN: 12.1 g/dL (ref 12.0–15.0)
Lymphocytes Relative: 23 % (ref 12.0–46.0)
Lymphs Abs: 1.4 10*3/uL (ref 0.7–4.0)
MCHC: 33.5 g/dL (ref 30.0–36.0)
MCV: 91.2 fl (ref 78.0–100.0)
MONOS PCT: 7.6 % (ref 3.0–12.0)
Monocytes Absolute: 0.5 10*3/uL (ref 0.1–1.0)
Neutro Abs: 3.9 10*3/uL (ref 1.4–7.7)
Neutrophils Relative %: 64.7 % (ref 43.0–77.0)
Platelets: 224 10*3/uL (ref 150.0–400.0)
RBC: 3.95 Mil/uL (ref 3.87–5.11)
RDW: 14.1 % (ref 11.5–15.5)
WBC: 6 10*3/uL (ref 4.0–10.5)

## 2015-01-04 LAB — HEPATIC FUNCTION PANEL
ALBUMIN: 3.9 g/dL (ref 3.5–5.2)
ALK PHOS: 90 U/L (ref 39–117)
ALT: 15 U/L (ref 0–35)
AST: 16 U/L (ref 0–37)
Bilirubin, Direct: 0.1 mg/dL (ref 0.0–0.3)
Total Bilirubin: 0.5 mg/dL (ref 0.2–1.2)
Total Protein: 7.6 g/dL (ref 6.0–8.3)

## 2015-01-04 LAB — BASIC METABOLIC PANEL
BUN: 35 mg/dL — ABNORMAL HIGH (ref 6–23)
CO2: 28 mEq/L (ref 19–32)
Calcium: 9.6 mg/dL (ref 8.4–10.5)
Chloride: 107 mEq/L (ref 96–112)
Creatinine, Ser: 1.33 mg/dL — ABNORMAL HIGH (ref 0.40–1.20)
GFR: 39.87 mL/min — AB (ref >60.00)
Glucose, Bld: 100 mg/dL — ABNORMAL HIGH (ref 70–99)
Potassium: 5.4 mEq/L — ABNORMAL HIGH (ref 3.5–5.1)
SODIUM: 143 meq/L (ref 135–145)

## 2015-01-04 LAB — POCT URINALYSIS DIPSTICK
Bilirubin, UA: NEGATIVE
Blood, UA: NEGATIVE
GLUCOSE UA: NEGATIVE
KETONES UA: NEGATIVE
Nitrite, UA: NEGATIVE
PH UA: 5.5
Protein, UA: NEGATIVE
Spec Grav, UA: 1.025
Urobilinogen, UA: 0.2

## 2015-01-04 LAB — LIPID PANEL
CHOL/HDL RATIO: 4
Cholesterol: 178 mg/dL (ref 0–200)
HDL: 42.7 mg/dL (ref >39.00)
LDL CALC: 107 mg/dL — AB (ref 0–99)
NONHDL: 134.98
Triglycerides: 138 mg/dL (ref 0.0–149.0)
VLDL: 27.6 mg/dL (ref 0.0–40.0)

## 2015-01-04 LAB — HEMOGLOBIN A1C: Hgb A1c MFr Bld: 6.1 % (ref 4.6–6.5)

## 2015-01-04 LAB — TSH: TSH: 1.63 u[IU]/mL (ref 0.35–4.50)

## 2015-01-06 ENCOUNTER — Encounter: Payer: Self-pay | Admitting: Internal Medicine

## 2015-01-06 ENCOUNTER — Ambulatory Visit (INDEPENDENT_AMBULATORY_CARE_PROVIDER_SITE_OTHER): Payer: Medicare Other | Admitting: Internal Medicine

## 2015-01-06 VITALS — BP 120/84 | HR 80 | Temp 98.2°F | Ht 60.0 in | Wt 165.0 lb

## 2015-01-06 DIAGNOSIS — I208 Other forms of angina pectoris: Secondary | ICD-10-CM

## 2015-01-06 DIAGNOSIS — E1121 Type 2 diabetes mellitus with diabetic nephropathy: Secondary | ICD-10-CM

## 2015-01-06 DIAGNOSIS — Z23 Encounter for immunization: Secondary | ICD-10-CM

## 2015-01-06 DIAGNOSIS — Z0000 Encounter for general adult medical examination without abnormal findings: Secondary | ICD-10-CM

## 2015-01-06 DIAGNOSIS — I2510 Atherosclerotic heart disease of native coronary artery without angina pectoris: Secondary | ICD-10-CM | POA: Diagnosis not present

## 2015-01-06 DIAGNOSIS — E785 Hyperlipidemia, unspecified: Secondary | ICD-10-CM | POA: Diagnosis not present

## 2015-01-06 DIAGNOSIS — I10 Essential (primary) hypertension: Secondary | ICD-10-CM | POA: Diagnosis not present

## 2015-01-06 NOTE — Progress Notes (Signed)
Pre visit review using our clinic review tool, if applicable. No additional management support is needed unless otherwise documented below in the visit note. 

## 2015-01-06 NOTE — Patient Instructions (Signed)
Limit your sodium (Salt) intake   Please check your hemoglobin A1c every 6 months  Heart-Healthy Eating Plan Heart-healthy meal planning includes:  Limiting unhealthy fats.  Increasing healthy fats.  Making other small dietary changes. You may need to talk with your doctor or a diet specialist (dietitian) to create an eating plan that is right for you. WHAT TYPES OF FAT SHOULD I CHOOSE?  Choose healthy fats. These include olive oil and canola oil, flaxseeds, walnuts, almonds, and seeds.  Eat more omega-3 fats. These include salmon, mackerel, sardines, tuna, flaxseed oil, and ground flaxseeds. Try to eat fish at least twice each week.  Limit saturated fats.  Saturated fats are often found in animal products, such as meats, butter, and cream.  Plant sources of saturated fats include palm oil, palm kernel oil, and coconut oil.  Avoid foods with partially hydrogenated oils in them. These include stick margarine, some tub margarines, cookies, crackers, and other baked goods. These contain trans fats. WHAT GENERAL GUIDELINES DO I NEED TO FOLLOW?  Check food labels carefully. Identify foods with trans fats or high amounts of saturated fat.  Fill one half of your plate with vegetables and green salads. Eat 4-5 servings of vegetables per day. A serving of vegetables is:  1 cup of raw leafy vegetables.   cup of raw or cooked cut-up vegetables.   cup of vegetable juice.  Fill one fourth of your plate with whole grains. Look for the word "whole" as the first word in the ingredient list.  Fill one fourth of your plate with lean protein foods.  Eat 4-5 servings of fruit per day. A serving of fruit is:  One medium whole fruit.   cup of dried fruit.   cup of fresh, frozen, or canned fruit.   cup of 100% fruit juice.  Eat more foods that contain soluble fiber. These include apples, broccoli, carrots, beans, peas, and barley. Try to get 20-30 g of fiber per day.  Eat more  home-cooked food. Eat less restaurant, buffet, and fast food.  Limit or avoid alcohol.  Limit foods high in starch and sugar.  Avoid fried foods.  Avoid frying your food. Try baking, boiling, grilling, or broiling it instead. You can also reduce fat by:  Removing the skin from poultry.  Removing all visible fats from meats.  Skimming the fat off of stews, soups, and gravies before serving them.  Steaming vegetables in water or broth.  Lose weight if you are overweight.  Eat 4-5 servings of nuts, legumes, and seeds per week:  One serving of dried beans or legumes equals  cup after being cooked.  One serving of nuts equals 1 ounces.  One serving of seeds equals  ounce or one tablespoon.  You may need to keep track of how much salt or sodium you eat. This is especially true if you have high blood pressure. Talk with your doctor or dietitian to get more information. WHAT FOODS CAN I EAT? Grains Breads, including [place], white, pita, wheat, raisin, rye, oatmeal, and [place]. Tortillas that are neither fried nor made with lard or trans fat. Low-fat rolls, including hotdog and hamburger buns and English muffins. Biscuits. Muffins. Waffles. Pancakes. Light popcorn. Whole-grain cereals. Flatbread. Melba toast. Pretzels. Breadsticks. Rusks. Low-fat snacks. Low-fat crackers, including oyster, saltine, matzo, graham, animal, and rye. Rice and pasta, including brown rice and pastas that are made with whole wheat.  Vegetables All vegetables.  Fruits All fruits, but limit coconut. Meats and Other Protein  Sources Lean, well-trimmed beef, veal, pork, and lamb. Chicken and [place] without skin. All fish and shellfish. Wild duck, rabbit, pheasant, and venison. Egg whites or low-cholesterol egg substitutes. Dried beans, peas, lentils, and tofu. Seeds and most nuts. Dairy Low-fat or nonfat cheeses, including ricotta, string, and mozzarella. Skim or 1% milk that is liquid, powdered, or evaporated.  Buttermilk that is made with low-fat milk. Nonfat or low-fat yogurt. Beverages Mineral water. Diet carbonated beverages. Sweets and Desserts Sherbets and fruit ices. Honey, jam, marmalade, jelly, and syrups. Meringues and gelatins. Pure sugar candy, such as hard candy, jelly beans, gumdrops, mints, marshmallows, and small amounts of dark chocolate. [employer] Eat all sweets and desserts in moderation. Fats and Oils Nonhydrogenated (trans-free) margarines. Vegetable oils, including soybean, sesame, sunflower, olive, peanut, safflower, corn, canola, and cottonseed. Salad dressings or mayonnaise made with a vegetable oil. Limit added fats and oils that you use for cooking, baking, salads, and as spreads. Other Cocoa powder. Coffee and tea. All seasonings and condiments. The items listed above may not be a complete list of recommended foods or beverages. Contact your dietitian for more options. WHAT FOODS ARE NOT RECOMMENDED? Grains Breads that are made with saturated or trans fats, oils, or whole milk. Croissants. Butter rolls. Cheese breads. Sweet rolls. Donuts. Buttered popcorn. Chow mein noodles. High-fat crackers, such as cheese or butter crackers. Meats and Other Protein Sources Fatty meats, such as hotdogs, short ribs, sausage, spareribs, bacon, rib eye roast or steak, and mutton. High-fat deli meats, such as salami and bologna. Caviar. Domestic duck and goose. Organ meats, such as kidney, liver, sweetbreads, and heart. Dairy Cream, sour cream, cream cheese, and creamed cottage cheese. Whole-milk cheeses, including blue (bleu), Monterey Jack, [place], [place], American, [place], Swiss, cheddar, [place], and [place]. Whole or 2% milk that is liquid, evaporated, or condensed. Whole buttermilk. Cream sauce or high-fat cheese sauce. Yogurt that is made from whole milk. Beverages Regular sodas and juice drinks with added sugar. Sweets and Desserts Frosting. Pudding. Cookies. Cakes other than  angel food cake. Candy that has milk chocolate or white chocolate, hydrogenated fat, butter, coconut, or unknown ingredients. Buttered syrups. Full-fat ice cream or ice cream drinks. Fats and Oils Gravy that has suet, meat fat, or shortening. Cocoa butter, hydrogenated oils, palm oil, coconut oil, palm kernel oil. These can often be found in baked products, candy, fried foods, nondairy creamers, and whipped toppings. Solid fats and shortenings, including bacon fat, salt pork, lard, and butter. Nondairy cream substitutes, such as coffee creamers and sour cream substitutes. Salad dressings that are made of unknown oils, cheese, or sour cream. The items listed above may not be a complete list of foods and beverages to avoid. Contact your dietitian for more information.   This information is not intended to replace advice given to you by your health care provider. Make sure you discuss any questions you have with your health care provider.   Document Released: 09/17/2011 Document Revised: 04/08/2014 Document Reviewed: 09/09/2013 Elsevier Interactive Patient Education [employer].

## 2015-01-06 NOTE — Progress Notes (Signed)
Subjective:    Patient ID: Sara Garza, female    DOB: [birth year], 81 y.o.   MRN: [id number]  HPI   81 year-old woman who is seen today for a preventive health exam. She underwent for [name] for treatment of severe symptomatic aortic stenosis in 2014. She was treated with a 23 mm Medtronic Corevalve through the moderate risk SurTAVI aortic stenosis trial. She also is followed for moderate coronary artery disease which was evaluated with pressure wire analysis of the RCA and LCx demonstrating no hemodynamic evidence of flow-obstruction.  She saw cardiology last month and has done quite well.  She has had a recent eye examination.  She lives alone and has had a recent fall with fracture to 4 toes of her left foot  The patient continues to do very well. Her echo images done at [facility] as part of the study protocol. She feels well and denies chest pain, lightheadedness, palpitations, orthopnea, or PND. She has mild exertional dyspnea which is unchanged over time. She has sustained a hip fracture.  She has been intolerant of statin therapy and presently is on Zetia; her blood pressure remains quite well controlled on amlodipine. She is on amoxicillin SBE prophylaxis prior to dental procedures.  She takes daily low-dose aspirin  Past Medical History  Diagnosis Date  . Hyperlipidemia   . Hypertension   . Osteoporosis   . Diabetes mellitus   . Arthritis   . Carotid stenosis, left     50-60% stable    Social History   Social History  . Marital Status: Widowed    Spouse Name: N/A  . Number of Children: N/A  . Years of Education: N/A   Occupational History  . Not on file.   Social History Main Topics  . Smoking status: Passive Smoke Exposure - Never Smoker  . Smokeless tobacco: Never Used  . Alcohol Use: 0.0 oz/week    0 Standard drinks or equivalent per week     Comment: very seldom  . Drug Use: No  . Sexual Activity: Not Currently   Other Topics Concern  .  Not on file   Social History Narrative    Past Surgical History  Procedure Laterality Date  . Cesarean section    . Appendectomy    . Foot surgery      [name]  . Cardiac valve replacement  04/29/2012  . Hip arthroplasty Right 07/31/2013    Procedure: ARTHROPLASTY BIPOLAR HIP;  Surgeon: [name], MD;  Location: [facility];  Service: Orthopedics;  Laterality: Right;  . Percutaneous coronary stent intervention (pci-s) N/A 01/02/2012    Procedure: PERCUTANEOUS CORONARY STENT INTERVENTION (PCI-S);  Surgeon: Sherren Mocha, MD;  Location: [facility] C[facility] LAB;  Service: Cardiovascular;  Laterality: N/A;    Family History  Problem Relation Age of Onset  . Lung cancer    . COPD Father   . Cancer Father     lung cancer    Allergies  Allergen Reactions  . Statins Other (See Comments)    Muscle aches    Current Outpatient Prescriptions on File Prior to Visit  Medication Sig Dispense Refill  . amLODipine (NORVASC) 5 MG tablet Take 1 tablet (5 mg total) by mouth daily. 30 tablet 5  . amoxicillin (AMOXIL) 500 MG capsule TAKE 4 TABLETS ONE HOUR PRIOR TO DENTAL PROCEDURES 8 capsule 2  . aspirin EC 81 MG tablet Take 1 tablet (81 mg total) by mouth daily. Start after lovenox injections are done  after 2 weeks    . bimatoprost (LUMIGAN) 0.01 % SOLN Place 1 drop into both eyes at bedtime.     . Calcium Carb-Cholecalciferol (CALCIUM 500 +D) 500-400 MG-UNIT TABS Take 1 tablet by mouth daily.     . isosorbide mononitrate (IMDUR) 30 MG 24 hr tablet Take 1 tablet (30 mg total) by mouth daily. 90 tablet 3  . Multiple Vitamin (MULTIVITAMIN) tablet Take 1 tablet by mouth daily.      . nitroGLYCERIN (NITROSTAT) 0.4 MG SL tablet Place 1 tablet (0.4 mg total) under the tongue every 5 (five) minutes as needed for chest pain (chest pressure and tightness). 25 tablet 1  . ZETIA 10 MG tablet TAKE 1 TABLET BY MOUTH ONCE DAILY 90 tablet 1   No current facility-administered medications on file prior to visit.     Temp(Src) 98.2 F (36.8 C) (Oral)  Ht 5' (1.524 m)  Wt 165 lb (74.844 kg)  BMI 32.22 kg/m2   1. Risk factors, based on past  M,S,F history.  Cardio vascular risk factors include hypertension, dyslipidemia and type 2 diabetes  2.  Physical activities: Limited due to age, arthritis, and recent left foot fracture  3.  Depression/mood: No history of major depression or mood disorder  4.  Hearing: Mild age-related deficits.  Does use hearing aids  5.  ADL's: Independent.  Lives alone  6.  Fall risk: High.  Has sustained a hip fracture as well as a left foot fracture  7.  Home safety: Unremarkable except for a high fall risk.  Does have a mobile phone that she keeps close by  8.  Height weight, and visual acuity; no change in visual acuity  9.  Counseling: Heart healthy diet recommended  10. Lab orders based on risk factors: Laboratory studies, including lipid profile and hemoglobin A1c unremarkable  11. Referral : Not appropriate at this time  12. Care plan: Heart healthy diet recommended low salt diet encouraged  13. Cognitive assessment: Alert and oriented with normal affect.  No cognitive dysfunction  14. Screening: Patient provided with a written and personalized 5-10 year screening schedule in the AVS.  will have annual primary care evaluations and cardiology consultations.  Annual eye examinations.  Encouraged  15. Provider List Update: Primary care cardiology and ophthalmology      Review of Systems  Constitutional: Negative.   HENT: Negative for congestion, dental problem, hearing loss, rhinorrhea, sinus pressure, sore throat and tinnitus.   Eyes: Negative for pain, discharge and visual disturbance.  Respiratory: Positive for shortness of breath. Negative for cough.   Cardiovascular: Negative for chest pain, palpitations and leg swelling.  Gastrointestinal: Negative for nausea, vomiting, abdominal pain, diarrhea, constipation, blood in stool and abdominal  distention.  Genitourinary: Negative for dysuria, urgency, frequency, hematuria, flank pain, vaginal bleeding, vaginal discharge, difficulty urinating, vaginal pain and pelvic pain.  Musculoskeletal: Positive for arthralgias and gait problem. Negative for joint swelling.  Skin: Negative for rash.  Neurological: Negative for dizziness, syncope, speech difficulty, weakness, numbness and headaches.  Hematological: Negative for adenopathy.  Psychiatric/Behavioral: Negative for behavioral problems, dysphoric mood and agitation. The patient is not nervous/anxious.        Objective:   Physical Exam  Constitutional: She is oriented to person, place, and time. She appears well-developed and well-nourished.  HENT:  Head: Normocephalic.  Right Ear: External ear normal.  Left Ear: External ear normal.  Mouth/Throat: Oropharynx is clear and moist.  Eyes: Conjunctivae and EOM are normal. Pupils are equal,  round, and reactive to light.  Neck: Normal range of motion. Neck supple. No thyromegaly present.  Cardiovascular: Normal rate and regular rhythm.   Murmur heard. Grade 1 over 6 systolic murmur  Pulmonary/Chest: Effort normal and breath sounds normal.  Abdominal: Soft. Bowel sounds are normal. She exhibits no mass. There is no tenderness.  Musculoskeletal: Normal range of motion.  Lymphadenopathy:    She has no cervical adenopathy.  Neurological: She is alert and oriented to person, place, and time.  Skin: Skin is warm and dry. No rash noted.  Psychiatric: She has a normal mood and affect. Her behavior is normal.          Assessment & Plan:   Status post [name]-stable Hypertension well controlled Chronic kidney disease.  Stable Osteoarthritis Status post right hip fracture.  Status post recent fracture, left foot  Preventive health.  Flu vaccine administered  No change in medical therapy Recheck 6 months with updated lab

## 2015-01-13 ENCOUNTER — Other Ambulatory Visit (HOSPITAL_COMMUNITY): Payer: Self-pay | Admitting: Sports Medicine

## 2015-01-13 ENCOUNTER — Ambulatory Visit (HOSPITAL_COMMUNITY)
Admission: RE | Admit: 2015-01-13 | Discharge: 2015-01-13 | Disposition: A | Discharge status: Home / Self Care | Payer: Medicare Other | Source: Ambulatory Visit | Attending: Sports Medicine | Admitting: Sports Medicine

## 2015-01-13 DIAGNOSIS — M810 Age-related osteoporosis without current pathological fracture: Secondary | ICD-10-CM | POA: Diagnosis not present

## 2015-01-13 DIAGNOSIS — S92322D Displaced fracture of second metatarsal bone, left foot, subsequent encounter for fracture with routine healing: Secondary | ICD-10-CM | POA: Diagnosis not present

## 2015-01-13 DIAGNOSIS — M1990 Unspecified osteoarthritis, unspecified site: Secondary | ICD-10-CM | POA: Diagnosis not present

## 2015-01-13 DIAGNOSIS — M79662 Pain in left lower leg: Secondary | ICD-10-CM | POA: Insufficient documentation

## 2015-01-13 DIAGNOSIS — S92332A Displaced fracture of third metatarsal bone, left foot, initial encounter for closed fracture: Secondary | ICD-10-CM | POA: Diagnosis not present

## 2015-01-13 DIAGNOSIS — M25562 Pain in left knee: Secondary | ICD-10-CM | POA: Diagnosis not present

## 2015-01-13 DIAGNOSIS — I10 Essential (primary) hypertension: Secondary | ICD-10-CM | POA: Diagnosis not present

## 2015-01-13 DIAGNOSIS — E785 Hyperlipidemia, unspecified: Secondary | ICD-10-CM | POA: Diagnosis not present

## 2015-01-13 DIAGNOSIS — E119 Type 2 diabetes mellitus without complications: Secondary | ICD-10-CM | POA: Insufficient documentation

## 2015-01-13 DIAGNOSIS — S92322A Displaced fracture of second metatarsal bone, left foot, initial encounter for closed fracture: Secondary | ICD-10-CM | POA: Diagnosis not present

## 2015-01-13 DIAGNOSIS — S92342A Displaced fracture of fourth metatarsal bone, left foot, initial encounter for closed fracture: Secondary | ICD-10-CM | POA: Diagnosis not present

## 2015-01-13 NOTE — Progress Notes (Signed)
Preliminary results by tech - Left Lower Ext. Venous Duplex Completed. Negative for deep and superficial vein thrombosis in the left leg. Results given to April, Dr. Nicolasa Ducking office. Oda Cogan, BS, RDMS, RVT

## 2015-01-27 DIAGNOSIS — S92322D Displaced fracture of second metatarsal bone, left foot, subsequent encounter for fracture with routine healing: Secondary | ICD-10-CM | POA: Diagnosis not present

## 2015-02-01 ENCOUNTER — Other Ambulatory Visit: Payer: Self-pay | Admitting: Internal Medicine

## 2015-02-26 ENCOUNTER — Other Ambulatory Visit: Payer: Self-pay | Admitting: Internal Medicine

## 2015-02-28 DIAGNOSIS — C44311 Basal cell carcinoma of skin of nose: Secondary | ICD-10-CM | POA: Diagnosis not present

## 2015-03-07 ENCOUNTER — Encounter: Payer: Self-pay | Admitting: Physical Therapy

## 2015-03-07 ENCOUNTER — Ambulatory Visit: Payer: Medicare Other | Attending: Sports Medicine | Admitting: Physical Therapy

## 2015-03-07 DIAGNOSIS — R269 Unspecified abnormalities of gait and mobility: Secondary | ICD-10-CM | POA: Insufficient documentation

## 2015-03-07 DIAGNOSIS — S92353D Displaced fracture of fifth metatarsal bone, unspecified foot, subsequent encounter for fracture with routine healing: Secondary | ICD-10-CM | POA: Diagnosis not present

## 2015-03-07 DIAGNOSIS — R531 Weakness: Secondary | ICD-10-CM | POA: Diagnosis not present

## 2015-03-07 DIAGNOSIS — S92323D Displaced fracture of second metatarsal bone, unspecified foot, subsequent encounter for fracture with routine healing: Secondary | ICD-10-CM | POA: Insufficient documentation

## 2015-03-07 DIAGNOSIS — S92343D Displaced fracture of fourth metatarsal bone, unspecified foot, subsequent encounter for fracture with routine healing: Secondary | ICD-10-CM | POA: Insufficient documentation

## 2015-03-07 DIAGNOSIS — X58XXXD Exposure to other specified factors, subsequent encounter: Secondary | ICD-10-CM | POA: Insufficient documentation

## 2015-03-07 DIAGNOSIS — M6281 Muscle weakness (generalized): Secondary | ICD-10-CM

## 2015-03-07 DIAGNOSIS — R2689 Other abnormalities of gait and mobility: Secondary | ICD-10-CM

## 2015-03-07 DIAGNOSIS — S92333D Displaced fracture of third metatarsal bone, unspecified foot, subsequent encounter for fracture with routine healing: Secondary | ICD-10-CM | POA: Diagnosis not present

## 2015-03-07 NOTE — Therapy (Addendum)
[facility] [address] [place], [place], [address] Phone: [phone]   Fax:  [phone]  Physical Therapy Evaluation  Patient Details  Name: Sara Garza MRN: [id number] Date of Birth: 27-Jul-1923 Referring Provider: Dr. [name]  Encounter Date: 03/07/2015      Sara Garza End of Session - 03/07/15 1303    Visit Number 1   Number of Visits 10  medicare   Date for Sara Garza Re-Evaluation 05/02/15   Sara Garza Start Time 1230   Sara Garza Stop Time 1300   Sara Garza Time Calculation (min) 30 min   Activity Tolerance Patient limited by pain   Behavior During Therapy [facility] for tasks assessed/performed      Past Medical History  Diagnosis Date  . Hyperlipidemia   . Hypertension   . Osteoporosis   . Diabetes mellitus   . Arthritis   . Carotid stenosis, left     50-60% stable    Past Surgical History  Procedure Laterality Date  . Cesarean section    . Appendectomy    . Foot surgery      [name]  . Cardiac valve replacement  04/29/2012  . Hip arthroplasty Right 07/31/2013    Procedure: ARTHROPLASTY BIPOLAR HIP;  Surgeon: [name], MD;  Location: [facility];  Service: Orthopedics;  Laterality: Right;  . Percutaneous coronary stent intervention (pci-s) N/A 01/02/2012    Procedure: PERCUTANEOUS CORONARY STENT INTERVENTION (PCI-S);  Surgeon: Sherren Mocha, MD;  Location: [facility] CATH LAB;  Service: Cardiovascular;  Laterality: N/A;    There were no vitals filed for this visit.  Visit Diagnosis:  Muscle weakness(generalized) M62.81 Other abnormalities of gait and mobility R26.89 Sara Garza, Sara Garza 08/02/2015 1:01 PM        Subjective Assessment - 03/07/15 1236    Subjective patient reports she is  at therapy to improve balance.  Patient reports in the past year she has fallen 3 times.    Patient Stated Goals improve balance   Currently in Pain? No/denies            [facility] Sara Garza Assessment - 03/07/15 0001    Assessment   Medical Diagnosis 2nd,  3rd 4th, 5th metatarsal fracture   Referring Provider Dr. [name]   Prior Therapy None   Precautions   Precautions Fall   Precaution Comments right hip replacement   Balance Screen   Has the patient fallen in the past 6 months Yes   How many times? 3   Has the patient had a decrease in activity level because of a fear of falling?  No   Is the patient reluctant to leave their home because of a fear of falling?  Yes   Prior Function   Level of Independence Independent   Cognition   Overall Cognitive Status Within Functional Limits for tasks assessed   Strength   Overall Strength Comments bil. ankle strength 4/5,   Right Hip Flexion 4/5   Right Hip ABduction 3/5   Left Hip Flexion 4/5   Left Hip Extension 3/5   Left Hip ABduction 3/5   Ambulation/Gait   Ambulation/Gait Yes   Assistive device Straight cane   Gait Pattern Decreased stride length;Decreased dorsiflexion - right;Decreased dorsiflexion - left   Stairs Yes   Stair Management Technique Step to pattern;Two rails   Berg Balance Test   Sit to Stand Able to stand without using hands and stabilize independently   Standing Unsupported Able to stand safely 2 minutes   Sitting with Back  Unsupported but Feet Supported on Floor or Stool Able to sit safely and securely 2 minutes   Stand to Sit Controls descent by using hands   Transfers Able to transfer safely, minor use of hands   Standing Unsupported with Eyes Closed Able to stand 10 seconds safely   Standing Ubsupported with Feet Together Able to place feet together independently and stand 1 minute safely   From Standing, Reach Forward with Outstretched Arm Can reach forward >12 cm safely (5")   From Standing Position, Pick up Object from [place] to pick up shoe, needs supervision   From Standing Position, Turn to Look Behind Over each Shoulder Looks behind from both sides and weight shifts well   Turn 360 Degrees Able to turn 360 degrees safely but slowly   Standing  Unsupported, Alternately Place Feet on Step/Stool Needs assistance to keep from falling or unable to try   Standing Unsupported, One Foot in [employer] balance while stepping or standing   Standing on One Leg Tries to lift leg/unable to hold 3 seconds but remains standing independently   Total Score 40   Berg comment: 80% chance of falling'   Timed Up and Go Test   TUG Normal TUG   Normal TUG (seconds) 24   TUG Comments used a single point cane                           Sara Garza Education - 03/07/15 1304    Education provided Yes   Education Details tips to avoid falls   Person(s) Educated Patient   Methods Explanation;Handout   Comprehension Verbalized understanding          Sara Garza Short Term Goals - 03/07/15 1314    Sara Garza SHORT TERM GOAL #1   Title indpendent with initial HEP   Time 4   Period Weeks   Status New   Sara Garza SHORT TERM GOAL #2   Title Berg balance score >/= 45/56   Time 4   Period Weeks   Status New   Sara Garza SHORT TERM GOAL #3   Title feels 25% steadier as she walks with single point cane.    Time 4   Period Weeks   Status New           Sara Garza Long Term Goals - 03/07/15 1315    Sara Garza LONG TERM GOAL #1   Title independent with HEP and how to progress herself   Time 8   Period Weeks   Status New   Sara Garza LONG TERM GOAL #2   Title berg balance score is >/= 48/56   Time 8   Period Weeks   Status New   Sara Garza LONG TERM GOAL #3   Title feels 75% confident with ambulation due to increased strength   Time 8   Period Weeks   Status New               Plan - 03/07/15 1305    Clinical Impression Statement Patient is a 81 year old female with diagonsis of 2nd, 3rd, 4th, and 5th metatarsal fracture. Patient reports she has had 3 falls in the past 2 years.  Patient reports no pain. Patient ambulates with single point cane with small steps and decreased dorsiflexion of bil. feet. Berg balance score is 40/56. TUG score with single point cane is 35 seconds.  Bilateral hip strength averages 4/5 and bilateral ankle strength is 4/5. Patient would benefit form physical  therapy to increase strength and improve balance.    Sara Garza will benefit from skilled therapeutic intervention in order to improve on the following deficits Decreased range of motion;Difficulty walking;Decreased strength;Decreased mobility;Decreased activity tolerance;Decreased endurance   Rehab Potential Good   Clinical Impairments Affecting Rehab Potential None   Sara Garza Frequency 2x / week   Sara Garza Duration 8 weeks   Sara Garza Treatment/Interventions Therapeutic exercise;Therapeutic activities;Balance training;Neuromuscular re-education;Patient/family education;Manual techniques   Sara Garza Next Visit Plan balance exercsises, leg strengthening   Sara Garza Home Exercise Plan hip exercises   Recommended Other Services None   Consulted and Agree with Plan of Care Patient          G-Codes - March 29, 2015 1301    Functional Assessment Tool Used Merrilee Jansky balance score is 40/56 CJ  goal is 48/56 CI   Functional Limitation Other Sara Garza primary   Other Sara Garza Primary Current Status [id number]) At least 20 percent but less than 40 percent impaired, limited or restricted   Other Sara Garza Primary Goal Status [id number]) At least 1 percent but less than 20 percent impaired, limited or restricted       Problem List Patient Active Problem List   Diagnosis Date Noted  . Fracture of hip, right, closed ([place]) 07/31/2013  . CAD (coronary artery disease) 07/31/2013  . CKD (chronic kidney disease), stage III 07/31/2013  . Closed right hip fracture ([place]) 07/31/2013  . Left bundle branch block 07/31/2013  . Severe calcific aortic stenosis 06/18/2010  . Diabetic polyneuropathy ([place]) 04/16/2010  . GLAUCOMA 01/06/2009  . CHEST PAIN, ATYPICAL 12/16/2008  . GANGLION CYST, JOINT 01/08/2008  . ROTATOR CUFF INJURY, RIGHT SHOULDER 11/25/2007  . SHOULDER PAIN, RIGHT 11/16/2007  . OSTEOARTHRITIS 02/02/2007  . Type 2 diabetes mellitus with renal manifestations,  controlled ([place]) 10/24/2006  . Dyslipidemia 10/02/2006  . Essential hypertension 10/02/2006    Sara Garza,Sara Garza,Sara Garza 03/29/15, 1:19 PM  [facility] [address] [place], [place], [address] Phone: [phone]   Fax:  [phone]  Name: Sara Garza MRN: [id number] Date of Birth: June 12, 1923

## 2015-03-07 NOTE — Patient Instructions (Signed)

## 2015-03-09 ENCOUNTER — Ambulatory Visit: Payer: Medicare Other | Admitting: Physical Therapy

## 2015-03-09 DIAGNOSIS — R2689 Other abnormalities of gait and mobility: Secondary | ICD-10-CM

## 2015-03-09 DIAGNOSIS — M6281 Muscle weakness (generalized): Secondary | ICD-10-CM

## 2015-03-09 DIAGNOSIS — R269 Unspecified abnormalities of gait and mobility: Secondary | ICD-10-CM | POA: Diagnosis not present

## 2015-03-09 DIAGNOSIS — R531 Weakness: Secondary | ICD-10-CM | POA: Diagnosis not present

## 2015-03-09 NOTE — Therapy (Addendum)
[facility] [address], [place], [place], [address] Phone: [phone]   Fax:  [phone]  Physical Therapy Treatment  Patient Details  Name: Sara Garza MRN: [id number] Date of Birth: 12/18/1923 Referring Provider: Dr. [name]  Encounter Date: 03/09/2015      PT End of Session - 03/09/15 1141    Visit Number 2   Number of Visits 10   Date for PT Re-Evaluation 05/02/15   PT Start Time 1102   PT Stop Time 1145   PT Time Calculation (min) 43 min   Behavior During Therapy [facility] for tasks assessed/performed      Past Medical History  Diagnosis Date  . Hyperlipidemia   . Hypertension   . Osteoporosis   . Diabetes mellitus   . Arthritis   . Carotid stenosis, left     50-60% stable    Past Surgical History  Procedure Laterality Date  . Cesarean section    . Appendectomy    . Foot surgery      [name]  . Cardiac valve replacement  04/29/2012  . Hip arthroplasty Right 07/31/2013    Procedure: ARTHROPLASTY BIPOLAR HIP;  Surgeon: [name], MD;  Location: [facility];  Service: Orthopedics;  Laterality: Right;  . Percutaneous coronary stent intervention (pci-s) N/A 01/02/2012    Procedure: PERCUTANEOUS CORONARY STENT INTERVENTION (PCI-S);  Surgeon: Sherren Mocha, MD;  Location: [facility] CATH LAB;  Service: Cardiovascular;  Laterality: N/A;    There were no vitals filed for this visit.  Visit Diagnosis:  Other abnormalities of gait and mobility R26.89 Muscle weakness (generalized) M62.81 Earlie Counts, PT 08/02/2015 1:02 PM       Subjective Assessment - 03/09/15 1132    Pertinent History heart valve replacement - needs frequent rests                         [facility] Adult PT Treatment/Exercise - 03/09/15 0001    High Level Balance   High Level Balance Activities Side stepping  min A   High Level Balance Comments tap ups with UE support 2 x 20   Knee/Hip Exercises: Aerobic   Nustep level 1 x 5  minutes   Knee/Hip Exercises: Standing   Knee Flexion Strengthening;Both;10 reps   Knee Flexion Limitations 1#   Hip Abduction Stengthening;Both;10 reps   Abduction Limitations 1#   Other Standing Knee Exercises mini squats x 10   Knee/Hip Exercises: Seated   Long Arc Quad Strengthening;Both;10 reps;Weights   Long Arc Quad Weight 1 lbs.                PT Education - 03/09/15 1141    Education provided Yes   Education Details HEP   Person(s) Educated Patient   Methods Explanation;Demonstration;Handout   Comprehension Returned demonstration;Verbalized understanding          PT Short Term Goals - 03/09/15 1144    PT SHORT TERM GOAL #1   Title indpendent with initial HEP   Time 4   Period Weeks   Status On-going   PT SHORT TERM GOAL #2   Title Berg balance score >/= 45/56   Time 4   Period Weeks   Status On-going   PT SHORT TERM GOAL #3   Title feels 25% steadier as she walks with single point cane.    Period Weeks   Status On-going           PT Long Term Goals -  03/09/15 1144    PT LONG TERM GOAL #1   Title independent with HEP and how to progress herself   Time 8   Period Weeks   Status On-going   PT LONG TERM GOAL #2   Title berg balance score is >/= 48/56   Time 8   Period Weeks   Status On-going   PT LONG TERM GOAL #3   Title feels 75% confident with ambulation due to increased strength   Time 8   Period Weeks   Status On-going               Plan - 03/09/15 1143    Clinical Impression Statement Pt fatigues easily, needs frequent rest breaks.  Encouraged pt to perform HEP to prevent falls. Pt will continue to benefit from PT to improve strength and balance   Pt will benefit from skilled therapeutic intervention in order to improve on the following deficits Decreased range of motion;Difficulty walking;Decreased strength;Decreased mobility;Decreased activity tolerance;Decreased endurance   PT Frequency 2x / week   PT Duration 8 weeks    PT Treatment/Interventions Therapeutic exercise;Therapeutic activities;Balance training;Neuromuscular re-education;Patient/family education;Manual techniques   PT Next Visit Plan review HEP, balance        Problem List Patient Active Problem List   Diagnosis Date Noted  . Fracture of hip, right, closed ([place]) 07/31/2013  . CAD (coronary artery disease) 07/31/2013  . CKD (chronic kidney disease), stage III 07/31/2013  . Closed right hip fracture ([place]) 07/31/2013  . Left bundle branch block 07/31/2013  . Severe calcific aortic stenosis 06/18/2010  . Diabetic polyneuropathy ([place]) 04/16/2010  . GLAUCOMA 01/06/2009  . CHEST PAIN, ATYPICAL 12/16/2008  . GANGLION CYST, JOINT 01/08/2008  . ROTATOR CUFF INJURY, RIGHT SHOULDER 11/25/2007  . SHOULDER PAIN, RIGHT 11/16/2007  . OSTEOARTHRITIS 02/02/2007  . Type 2 diabetes mellitus with renal manifestations, controlled ([place]) 10/24/2006  . Dyslipidemia 10/02/2006  . Essential hypertension 10/02/2006    Isabelle Course, PT, DPT  03/09/2015, 11:46 AM  [facility] [address], [place], [place], [address] Phone: [phone]   Fax:  [phone]  Name: Sara Garza MRN: [id number] Date of Birth: 1923-06-20

## 2015-03-09 NOTE — Patient Instructions (Signed)
Mini Squat: Double Leg   Hold on to counter or back of bulky chair. With feet shoulder width apart, reach forward for balance and do a mini squat. Keep knees in line with second toe. Knees do not go past toes. Repeat 10 times per set.  Do 1 sets per session.  Knee Extension: Resisted (Sitting)   Straighten 1 leg. Hold 5 seconds. Repeat with other leg. Repeat 10 times per set. Do _1 sets per session. Do 1 sessions per day.  ABDUCTION: Standing - Resistance Band (Active)   Stand, feet flat.lift right leg out to side, repeat with left leg. Complete 1 sets of10 repetitions. Perform _1 sessions per day.  [place] [address] [place],  [address] Phone # [phone] Fax [phone]

## 2015-03-14 ENCOUNTER — Ambulatory Visit: Payer: Medicare Other | Admitting: Physical Therapy

## 2015-03-14 DIAGNOSIS — M6281 Muscle weakness (generalized): Secondary | ICD-10-CM

## 2015-03-14 DIAGNOSIS — R2689 Other abnormalities of gait and mobility: Secondary | ICD-10-CM

## 2015-03-14 DIAGNOSIS — R269 Unspecified abnormalities of gait and mobility: Secondary | ICD-10-CM | POA: Diagnosis not present

## 2015-03-14 DIAGNOSIS — R531 Weakness: Secondary | ICD-10-CM | POA: Diagnosis not present

## 2015-03-14 NOTE — Therapy (Addendum)
[facility] [address] [place], [place], [address] Phone: [phone]   Fax:  [phone]  Physical Therapy Treatment  Patient Details  Name: Sara Garza MRN: [id number] Date of Birth: [birth year] Referring Provider: Dr. [name]  Encounter Date: 03/14/2015      PT End of Session - 03/14/15 1144    Visit Number 3   Number of Visits 10   Date for PT Re-Evaluation 05/02/15   PT Start Time 1102   PT Stop Time 1144   PT Time Calculation (min) 42 min   Activity Tolerance Patient limited by pain   Behavior During Therapy [facility] for tasks assessed/performed      Past Medical History  Diagnosis Date  . Hyperlipidemia   . Hypertension   . Osteoporosis   . Diabetes mellitus   . Arthritis   . Carotid stenosis, left     50-60% stable    Past Surgical History  Procedure Laterality Date  . Cesarean section    . Appendectomy    . Foot surgery      [name]  . Cardiac valve replacement  04/29/2012  . Hip arthroplasty Right 07/31/2013    Procedure: ARTHROPLASTY BIPOLAR HIP;  Surgeon: [name], MD;  Location: [facility];  Service: Orthopedics;  Laterality: Right;  . Percutaneous coronary stent intervention (pci-s) N/A 01/02/2012    Procedure: PERCUTANEOUS CORONARY STENT INTERVENTION (PCI-S);  Surgeon: Sherren Mocha, MD;  Location: [facility] CATH LAB;  Service: Cardiovascular;  Laterality: N/A;    There were no vitals filed for this visit.  Visit Diagnosis:  Other abnormalities of gait and mobility R26.89 Muscle weakness ( generalized) M62.81 Earlie Counts, PT 08/02/2015 1:05 PM        Subjective Assessment - 03/14/15 1103    Subjective Pt states she has had no problems this week   Pertinent History heart valve replacement - needs frequent rests   Patient Stated Goals improve balance   Currently in Pain? No/denies                         [facility] Adult PT Treatment/Exercise - 03/14/15 0001    Ambulation/Gait   Stairs Yes   Stair Management Technique Two rails;Step to pattern   Number of Stairs 4   High Level Balance   High Level Balance Activities Side stepping;Backward walking;Other (comment)  box stepping   High Level Balance Comments tap ups x 2 with 1 UE support, standing on foam 6 x 10 seconds without UE support   Knee/Hip Exercises: Aerobic   Nustep level 1 x 5 minutes   Knee/Hip Exercises: Seated   Long Arc Quad Strengthening;Both;2 sets;10 reps   Sit to [employer] 10 reps                  PT Short Term Goals - 03/14/15 1145    PT SHORT TERM GOAL #1   Title indpendent with initial HEP   Time 4   Period Weeks   Status On-going   PT SHORT TERM GOAL #2   Title Berg balance score >/= 45/56   Time 4   Period Weeks   Status On-going   PT SHORT TERM GOAL #3   Title feels 25% steadier as she walks with single point cane.    Time 4   Period Weeks   Status On-going           PT Long Term Goals - 03/14/15 1145  PT LONG TERM GOAL #1   Title independent with HEP and how to progress herself   Time 8   Period Weeks   Status On-going   PT LONG TERM GOAL #2   Title berg balance score is >/= 48/56   Time 8   Period Weeks   Status On-going   PT LONG TERM GOAL #3   Title feels 75% confident with ambulation due to increased strength   Time 8   Period Weeks   Status On-going               Plan - 03/14/15 1144    Clinical Impression Statement Pt progressing well with balance, continues to need frequent rest breaks.  Pt will continue to benefit from continued balance training   Pt will benefit from skilled therapeutic intervention in order to improve on the following deficits Decreased range of motion;Difficulty walking;Decreased strength;Decreased mobility;Decreased activity tolerance;Decreased endurance   Rehab Potential Good   PT Frequency 2x / week   PT Duration 8 weeks   PT Treatment/Interventions Therapeutic exercise;Therapeutic  activities;Balance training;Neuromuscular re-education;Patient/family education;Manual techniques   PT Next Visit Plan continue balance and gait training   Consulted and Agree with Plan of Care Patient        Problem List Patient Active Problem List   Diagnosis Date Noted  . Fracture of hip, right, closed ([place]) 07/31/2013  . CAD (coronary artery disease) 07/31/2013  . CKD (chronic kidney disease), stage III 07/31/2013  . Closed right hip fracture ([place]) 07/31/2013  . Left bundle branch block 07/31/2013  . Severe calcific aortic stenosis 06/18/2010  . Diabetic polyneuropathy ([place]) 04/16/2010  . GLAUCOMA 01/06/2009  . CHEST PAIN, ATYPICAL 12/16/2008  . GANGLION CYST, JOINT 01/08/2008  . ROTATOR CUFF INJURY, RIGHT SHOULDER 11/25/2007  . SHOULDER PAIN, RIGHT 11/16/2007  . OSTEOARTHRITIS 02/02/2007  . Type 2 diabetes mellitus with renal manifestations, controlled ([place]) 10/24/2006  . Dyslipidemia 10/02/2006  . Essential hypertension 10/02/2006    Isabelle Course, PT, DPT  03/14/2015, 11:46 AM  [facility] [address] [place], [place], [address] Phone: [phone]   Fax:  [phone]  Name: Sara Garza MRN: [id number] Date of Birth: [birth year]

## 2015-03-16 ENCOUNTER — Ambulatory Visit: Payer: Medicare Other | Admitting: Physical Therapy

## 2015-03-16 ENCOUNTER — Encounter: Payer: Self-pay | Admitting: Physical Therapy

## 2015-03-16 DIAGNOSIS — R2689 Other abnormalities of gait and mobility: Secondary | ICD-10-CM

## 2015-03-16 DIAGNOSIS — R269 Unspecified abnormalities of gait and mobility: Secondary | ICD-10-CM | POA: Diagnosis not present

## 2015-03-16 DIAGNOSIS — R531 Weakness: Secondary | ICD-10-CM | POA: Diagnosis not present

## 2015-03-16 DIAGNOSIS — M6281 Muscle weakness (generalized): Secondary | ICD-10-CM

## 2015-03-16 NOTE — Therapy (Addendum)
[facility] [address] [place], [place], [address] Phone: [phone]   Fax:  [phone]  Physical Therapy Treatment  Patient Details  Name: Sara Garza MRN: [id number] Date of Birth: 01-Aug-1923 Referring Provider: Dr. [name]  Encounter Date: 03/16/2015      PT End of Session - 03/16/15 1029    Visit Number 4   Number of Visits 10  Medicare   Date for PT Re-Evaluation 05/02/15   PT Start Time 1020   PT Stop Time 1058   PT Time Calculation (min) 38 min   Activity Tolerance Patient limited by pain   Behavior During Therapy [facility] for tasks assessed/performed      Past Medical History  Diagnosis Date  . Hyperlipidemia   . Hypertension   . Osteoporosis   . Diabetes mellitus   . Arthritis   . Carotid stenosis, left     50-60% stable    Past Surgical History  Procedure Laterality Date  . Cesarean section    . Appendectomy    . Foot surgery      [name]  . Cardiac valve replacement  04/29/2012  . Hip arthroplasty Right 07/31/2013    Procedure: ARTHROPLASTY BIPOLAR HIP;  Surgeon: [name], MD;  Location: [facility];  Service: Orthopedics;  Laterality: Right;  . Percutaneous coronary stent intervention (pci-s) N/A 01/02/2012    Procedure: PERCUTANEOUS CORONARY STENT INTERVENTION (PCI-S);  Surgeon: Sherren Mocha, MD;  Location: [facility] CATH LAB;  Service: Cardiovascular;  Laterality: N/A;    There were no vitals filed for this visit.  Visit Diagnosis:  Other abnormalities of gait and mobility R26.89 Muscle weakness ( generalized) M62.81 Sara Garza, PT 08/02/2015 1:08 PM        Subjective Assessment - 03/16/15 1029    Subjective No falls since initial evaluation.  I am doing pretty good.    Pertinent History heart valve replacement - needs frequent rests   Patient Stated Goals improve balance   Currently in Pain? No/denies                         [facility] Adult PT Treatment/Exercise  - 03/16/15 0001    Ambulation/Gait   Stairs Yes   Stair Management Technique Two rails;Alternating pattern  Therapist guides legs   Number of Stairs 4  4 times   Knee/Hip Exercises: Aerobic   Nustep level 1 x 5 minutes   Knee/Hip Exercises: Standing   Knee Flexion Strengthening;Right;Left;10 reps  no holding on   Other Standing Knee Exercises walking sidestep with c.g. 10 feet 6x; tandem step with foot ahead of other 10 feet with c.g. and patient apprehensive   Other Standing Knee Exercises stand on blue foam toe tap on 6 inch step holding on wobbly pole 0x   Knee/Hip Exercises: Seated   Long Arc Quad Strengthening;Both;10 reps;3 sets   [employer] Weight 1 lbs.   Sit to [employer] 10 reps                PT Education - 03/16/15 1056    Education provided No          PT Short Term Goals - 03/14/15 1145    PT SHORT TERM GOAL #1   Title indpendent with initial HEP   Time 4   Period Weeks   Status On-going   PT SHORT TERM GOAL #2   Title Berg balance score >/= 45/56   Time 4  Period Weeks   Status On-going   PT SHORT TERM GOAL #3   Title feels 25% steadier as she walks with single point cane.    Time 4   Period Weeks   Status On-going           PT Long Term Goals - 03/14/15 1145    PT LONG TERM GOAL #1   Title independent with HEP and how to progress herself   Time 8   Period Weeks   Status On-going   PT LONG TERM GOAL #2   Title berg balance score is >/= 48/56   Time 8   Period Weeks   Status On-going   PT LONG TERM GOAL #3   Title feels 75% confident with ambulation due to increased strength   Time 8   Period Weeks   Status On-going               Plan - 03/16/15 1056    Clinical Impression Statement Patient needs verbal cues to go up and down stairs with alternate steps.  Patient gets apprehensive with new activities that challenge her balance.  Patient will needs rest to breath at times.  Patient benefits from physical therapy to  increase strength and balance.    Pt will benefit from skilled therapeutic intervention in order to improve on the following deficits Decreased range of motion;Difficulty walking;Decreased strength;Decreased mobility;Decreased activity tolerance;Decreased endurance   Rehab Potential Good   Clinical Impairments Affecting Rehab Potential None   PT Frequency 2x / week   PT Duration 8 weeks   PT Treatment/Interventions Therapeutic exercise;Therapeutic activities;Balance training;Neuromuscular re-education;Patient/family education;Manual techniques   PT Next Visit Plan continue balance and gait training   PT Home Exercise Plan stand hip exercises   Consulted and Agree with Plan of Care Patient        Problem List Patient Active Problem List   Diagnosis Date Noted  . Fracture of hip, right, closed ([place]) 07/31/2013  . CAD (coronary artery disease) 07/31/2013  . CKD (chronic kidney disease), stage III 07/31/2013  . Closed right hip fracture ([place]) 07/31/2013  . Left bundle branch block 07/31/2013  . Severe calcific aortic stenosis 06/18/2010  . Diabetic polyneuropathy ([place]) 04/16/2010  . GLAUCOMA 01/06/2009  . CHEST PAIN, ATYPICAL 12/16/2008  . GANGLION CYST, JOINT 01/08/2008  . ROTATOR CUFF INJURY, RIGHT SHOULDER 11/25/2007  . SHOULDER PAIN, RIGHT 11/16/2007  . OSTEOARTHRITIS 02/02/2007  . Type 2 diabetes mellitus with renal manifestations, controlled ([place]) 10/24/2006  . Dyslipidemia 10/02/2006  . Essential hypertension 10/02/2006    Sara Garza, PT 03/16/2015 10:59 AM   [facility] [address] [place], [place], [address] Phone: [phone]   Fax:  [phone]  Name: Sara Garza MRN: [id number] Date of Birth: [birth year]

## 2015-03-22 ENCOUNTER — Ambulatory Visit: Payer: Medicare Other | Admitting: Physical Therapy

## 2015-03-22 ENCOUNTER — Telehealth: Payer: Self-pay | Admitting: Physical Therapy

## 2015-03-22 NOTE — Telephone Encounter (Signed)
Pt missed her appointment on Wednesday 21, 2016 at 11:00 am. Jeanine Luz Houegnifio, PTA called [phone] and left message for pt.to return out call. Next and only scheduled appointment is Wed 03/29/2015. Jeanine Luz Houegnifio PTA

## 2015-03-28 DIAGNOSIS — Z1231 Encounter for screening mammogram for malignant neoplasm of breast: Secondary | ICD-10-CM | POA: Diagnosis not present

## 2015-03-28 LAB — HM MAMMOGRAPHY: HM Mammogram: NEGATIVE

## 2015-03-29 ENCOUNTER — Ambulatory Visit: Payer: Medicare Other

## 2015-03-29 ENCOUNTER — Encounter: Payer: Self-pay | Admitting: *Deleted

## 2015-03-29 DIAGNOSIS — M6281 Muscle weakness (generalized): Secondary | ICD-10-CM

## 2015-03-29 DIAGNOSIS — R531 Weakness: Secondary | ICD-10-CM | POA: Diagnosis not present

## 2015-03-29 DIAGNOSIS — R2689 Other abnormalities of gait and mobility: Secondary | ICD-10-CM

## 2015-03-29 DIAGNOSIS — R269 Unspecified abnormalities of gait and mobility: Secondary | ICD-10-CM | POA: Diagnosis not present

## 2015-03-29 NOTE — Therapy (Addendum)
[facility] [address], [place], [place], [address] Phone: [phone]   Fax:  [phone]  Physical Therapy Treatment  Patient Details  Name: Sara Garza MRN: [id number] Date of Birth: 1923/12/02 Referring Provider: Dr. [name]  Encounter Date: 03/29/2015      PT End of Session - 03/29/15 1215    Visit Number 5   Number of Visits 10   Date for PT Re-Evaluation 05/02/15   PT Start Time 1146   PT Stop Time 1220   PT Time Calculation (min) 34 min   Activity Tolerance Patient limited by fatigue  fatigue today and frequent rest breaks   Behavior During Therapy [facility] for tasks assessed/performed      Past Medical History  Diagnosis Date  . Hyperlipidemia   . Hypertension   . Osteoporosis   . Diabetes mellitus   . Arthritis   . Carotid stenosis, left     50-60% stable    Past Surgical History  Procedure Laterality Date  . Cesarean section    . Appendectomy    . Foot surgery      [name]  . Cardiac valve replacement  04/29/2012  . Hip arthroplasty Right 07/31/2013    Procedure: ARTHROPLASTY BIPOLAR HIP;  Surgeon: [name], MD;  [facility]: [facility];  Service: Orthopedics;  Laterality: Right;  . Percutaneous coronary stent intervention (pci-s) N/A 01/02/2012    Procedure: PERCUTANEOUS CORONARY STENT INTERVENTION (PCI-S);  Surgeon: Sherren Mocha, MD;  Location: [facility] CATH LAB;  Service: Cardiovascular;  Laterality: N/A;    There were no vitals filed for this visit.  Visit Diagnosis: Other abnormalities of gait and mobility R26.89 Muscle weakness ( generalized) M62.81 Earlie Counts, PT 08/02/2015 1:10 PM          Subjective Assessment - 03/29/15 1151    Subjective Pt reports that she is very tired and weak in her legs   Pertinent History heart valve replacement - needs frequent rests   Currently in Pain? No/denies                         [facility] Adult PT Treatment/Exercise - 03/29/15  0001    Ambulation/Gait   Curb 4: Min assist;6: Modified independent (Device/increase time)  use 4" step in clinic to simulate. Use of cane   Knee/Hip Exercises: Aerobic   Nustep level 1 x 5 minutes  seat 3, arms 7   Knee/Hip Exercises: Standing   Forward Step Up Right;2 sets;10 reps;Hand Hold: 1;Step Height: 4"  use of rail on Rt and min hand held on the Lt.    Knee/Hip Exercises: Seated   Long Arc Quad Strengthening;Both;10 reps;3 sets   [employer] Weight 1 lbs.   Marching Limitations seated 2x10   Marching Weights 1 lbs.   Abduction/Adduction  Both;2 sets;10 reps  ball squeeze   Abd/Adduction Limitations abduction with red band 2x10                  PT Short Term Goals - 03/29/15 1152    PT SHORT TERM GOAL #1   Title indpendent with initial HEP   Status Achieved   PT SHORT TERM GOAL #3   Title feels 25% steadier as she walks with single point cane.    Time 4   Period Weeks   Status On-going  no change reported           PT Long Term Goals - 03/14/15  [place] #1   Title independent with HEP and how to progress herself   Time 8   Period Weeks   Status On-going   PT LONG TERM GOAL #2   Title berg balance score is >/= 48/56   Time 8   Period Weeks   Status On-going   PT LONG TERM GOAL #3   Title feels 75% confident with ambulation due to increased strength   Time 8   Period Weeks   Status On-going               Plan - 03/29/15 1152    Clinical Impression Statement Pt with continued LE weakness balance deficits s/p Lt foot fracture and immobility for many months.  Pt is independent in initial HEP.  Pt is not able to ascend a curb even with assist from a family memeber. In the clinic, pt was able to ascend 4" step with use of cane and min assist by PT.  Pt will benefit from skilled PT for LE strength, balance training and practice with ascending/descneding curbs to imrpove safety and independence in the community.     Pt will  benefit from skilled therapeutic intervention in order to improve on the following deficits Decreased range of motion;Difficulty walking;Decreased strength;Decreased mobility;Decreased activity tolerance;Decreased endurance   Rehab Potential Good   PT Frequency 2x / week   PT Duration 8 weeks   PT Treatment/Interventions Therapeutic exercise;Therapeutic activities;Balance training;Neuromuscular re-education;Patient/family education;Manual techniques   PT Next Visit Plan continue balance and gait training   Consulted and Agree with Plan of Care Patient        Problem List Patient Active Problem List   Diagnosis Date Noted  . Fracture of hip, right, closed ([place]) 07/31/2013  . CAD (coronary artery disease) 07/31/2013  . CKD (chronic kidney disease), stage III 07/31/2013  . Closed right hip fracture ([place]) 07/31/2013  . Left bundle branch block 07/31/2013  . Severe calcific aortic stenosis 06/18/2010  . Diabetic polyneuropathy ([place]) 04/16/2010  . GLAUCOMA 01/06/2009  . CHEST PAIN, ATYPICAL 12/16/2008  . GANGLION CYST, JOINT 01/08/2008  . ROTATOR CUFF INJURY, RIGHT SHOULDER 11/25/2007  . SHOULDER PAIN, RIGHT 11/16/2007  . OSTEOARTHRITIS 02/02/2007  . Type 2 diabetes mellitus with renal manifestations, controlled ([place]) 10/24/2006  . Dyslipidemia 10/02/2006  . Essential hypertension 10/02/2006    TAKACS,KELLY, PT 03/29/2015, 12:16 PM  [facility] [address] [place], [place], [address] Phone: [phone]   Fax:  [phone]  Name: LYNNEA WEISBERG MRN: [place]:[id number] Date of Birth: February 17, 1924

## 2015-04-05 ENCOUNTER — Ambulatory Visit: Payer: Medicare Other | Attending: Sports Medicine

## 2015-04-05 DIAGNOSIS — S92333A Displaced fracture of third metatarsal bone, unspecified foot, initial encounter for closed fracture: Secondary | ICD-10-CM | POA: Insufficient documentation

## 2015-04-05 DIAGNOSIS — R269 Unspecified abnormalities of gait and mobility: Secondary | ICD-10-CM | POA: Insufficient documentation

## 2015-04-05 DIAGNOSIS — S92323A Displaced fracture of second metatarsal bone, unspecified foot, initial encounter for closed fracture: Secondary | ICD-10-CM | POA: Insufficient documentation

## 2015-04-05 DIAGNOSIS — R531 Weakness: Secondary | ICD-10-CM | POA: Diagnosis not present

## 2015-04-05 DIAGNOSIS — M6281 Muscle weakness (generalized): Secondary | ICD-10-CM

## 2015-04-05 DIAGNOSIS — X58XXXA Exposure to other specified factors, initial encounter: Secondary | ICD-10-CM | POA: Insufficient documentation

## 2015-04-05 DIAGNOSIS — R2689 Other abnormalities of gait and mobility: Secondary | ICD-10-CM

## 2015-04-05 DIAGNOSIS — S92353A Displaced fracture of fifth metatarsal bone, unspecified foot, initial encounter for closed fracture: Secondary | ICD-10-CM | POA: Insufficient documentation

## 2015-04-05 DIAGNOSIS — S92343A Displaced fracture of fourth metatarsal bone, unspecified foot, initial encounter for closed fracture: Secondary | ICD-10-CM | POA: Insufficient documentation

## 2015-04-05 NOTE — Therapy (Addendum)
[facility] [address] [place], [place], [address] Phone: [phone]   Fax:  [phone]  Physical Therapy Treatment  Patient Details  Name: Sara Garza MRN: [id number] Date of Birth: Nov 02, 1923 Referring Provider: Dr. [name]  Encounter Date: 04/05/2015      PT End of Session - 04/05/15 1217    Visit Number 6   Number of Visits 10   Date for PT Re-Evaluation 05/02/15   PT Start Time 1147   PT Stop Time 1227   PT Time Calculation (min) 40 min   Activity Tolerance Patient tolerated treatment well   Behavior During Therapy [facility] for tasks assessed/performed      Past Medical History  Diagnosis Date  . Hyperlipidemia   . Hypertension   . Osteoporosis   . Diabetes mellitus   . Arthritis   . Carotid stenosis, left     50-60% stable    Past Surgical History  Procedure Laterality Date  . Cesarean section    . Appendectomy    . Foot surgery      [name]  . Cardiac valve replacement  04/29/2012  . Hip arthroplasty Right 07/31/2013    Procedure: ARTHROPLASTY BIPOLAR HIP;  Surgeon: [name], MD;  [facility];  Service: Orthopedics;  Laterality: Right;  . Percutaneous coronary stent intervention (pci-s) N/A 01/02/2012    Procedure: PERCUTANEOUS CORONARY STENT INTERVENTION (PCI-S);  Surgeon: Sherren Mocha, MD;  Location: [facility] CATH LAB;  Service: Cardiovascular;  Laterality: N/A;    There were no vitals filed for this visit.  Visit Diagnosis:  Other abnormalities of gait and mobility R26.89 Muscle weakness ( generalized) M62.81 Earlie Counts, PT 08/02/2015 1:11 PM        Subjective Assessment - 04/05/15 1154    Subjective Pt still having diffuclty with ascending curbs.  Pt reports less fatigue today.   Currently in Pain? No/denies                         [facility] Adult PT Treatment/Exercise - 04/05/15 0001    Ambulation/Gait   Curb 4: Min assist;6: Modified independent  (Device/increase time)  use 4" step in clinic to simulate. Use of cane   Knee/Hip Exercises: Aerobic   Nustep level 1 x 5 minutes  seat 4, arms 7   Knee/Hip Exercises: Standing   Knee Flexion Strengthening;Right;Left;10 reps  no holding on   Hip Abduction Stengthening;Both;10 reps   Forward Step Up Right;2 sets;10 reps;Hand Hold: 1;Step Height: 4"  use of rail on Rt and min hand held on the Lt.    Rebounder 3 ways weight shifting x 1 minute each  PT assist with getting on/off   Knee/Hip Exercises: Seated   Long Arc Quad Strengthening;Both;10 reps;3 sets   [employer] Weight 1 lbs.   Marching Limitations seated 2x10   Marching Weights 1 lbs.   Abduction/Adduction  Both;2 sets;10 reps  ball squeeze                  PT Short Term Goals - 04/05/15 1153    PT SHORT TERM GOAL #3   Title feels 25% steadier as she walks with single point cane.    Time 4   Period Weeks   Status On-going           PT Long Term Goals - 04/05/15 1153    PT LONG TERM GOAL #1   Title independent with HEP and  how to progress herself   Time 8   Period Weeks   Status On-going   PT LONG TERM GOAL #3   Title feels 75% confident with ambulation due to increased strength   Time 8   Period Weeks   Status On-going               Plan - 04/05/15 1154    Clinical Impression Statement Pt with continued LE weakness and balance deficits s/p Lt foot fracture and immobility for many months.  Pt is independent in intial HEP.  Pt with continued difficulty ascending curbs with assist from a family member.  Pt is able to ascned 4" step in the clinic with use of cane with min assist by PT.  Pt will benefit from skilled PT for LE strength and balance training and practice with ascending curbs to improve safety and independence in the community.     Pt will benefit from skilled therapeutic intervention in order to improve on the following deficits Decreased range of motion;Difficulty walking;Decreased  strength;Decreased mobility;Decreased activity tolerance;Decreased endurance   Rehab Potential Good   PT Frequency 2x / week   PT Duration 8 weeks   PT Next Visit Plan continue balance and gait training   Consulted and Agree with Plan of Care Patient        Problem List Patient Active Problem List   Diagnosis Date Noted  . Fracture of hip, right, closed ([place]) 07/31/2013  . CAD (coronary artery disease) 07/31/2013  . CKD (chronic kidney disease), stage III 07/31/2013  . Closed right hip fracture ([place]) 07/31/2013  . Left bundle branch block 07/31/2013  . Severe calcific aortic stenosis 06/18/2010  . Diabetic polyneuropathy ([place]) 04/16/2010  . GLAUCOMA 01/06/2009  . CHEST PAIN, ATYPICAL 12/16/2008  . GANGLION CYST, JOINT 01/08/2008  . ROTATOR CUFF INJURY, RIGHT SHOULDER 11/25/2007  . SHOULDER PAIN, RIGHT 11/16/2007  . OSTEOARTHRITIS 02/02/2007  . Type 2 diabetes mellitus with renal manifestations, controlled ([place]) 10/24/2006  . Dyslipidemia 10/02/2006  . Essential hypertension 10/02/2006    TAKACS,KELLY, PT 04/05/2015, 12:35 PM  [facility] [address], [place], [place], [address] Phone: [phone]   Fax:  [phone]  Name: Sara Garza MRN: [id number] Date of Birth: 1923/05/23

## 2015-04-12 ENCOUNTER — Ambulatory Visit: Payer: Medicare Other | Admitting: Physical Therapy

## 2015-04-12 ENCOUNTER — Encounter: Payer: Self-pay | Admitting: Physical Therapy

## 2015-04-12 DIAGNOSIS — R2689 Other abnormalities of gait and mobility: Secondary | ICD-10-CM

## 2015-04-12 DIAGNOSIS — R531 Weakness: Secondary | ICD-10-CM | POA: Diagnosis not present

## 2015-04-12 DIAGNOSIS — R269 Unspecified abnormalities of gait and mobility: Secondary | ICD-10-CM | POA: Diagnosis not present

## 2015-04-12 DIAGNOSIS — M6281 Muscle weakness (generalized): Secondary | ICD-10-CM

## 2015-04-12 NOTE — Therapy (Addendum)
[facility] [address] [place], [place], [address] Phone: [phone]   Fax:  [phone]  Physical Therapy Treatment  Patient Details  Name: Sara Garza MRN: [id number] Date of Birth: 1923-07-19 Referring Provider: Dr. [name]  Encounter Date: 04/12/2015      PT End of Session - 04/12/15 1359    Visit Number 7   Number of Visits 10   Date for PT Re-Evaluation 05/02/15   PT Start Time 1400   PT Stop Time 1440   PT Time Calculation (min) 40 min   Activity Tolerance Patient tolerated treatment well   Behavior During Therapy [facility] for tasks assessed/performed      Past Medical History  Diagnosis Date  . Hyperlipidemia   . Hypertension   . Osteoporosis   . Diabetes mellitus   . Arthritis   . Carotid stenosis, left     50-60% stable    Past Surgical History  Procedure Laterality Date  . Cesarean section    . Appendectomy    . Foot surgery      [name]  . Cardiac valve replacement  04/29/2012  . Hip arthroplasty Right 07/31/2013    Procedure: ARTHROPLASTY BIPOLAR HIP;  Surgeon: [name], MD;  Location: [facility];  Service: Orthopedics;  Laterality: Right;  . Percutaneous coronary stent intervention (pci-s) N/A 01/02/2012    Procedure: PERCUTANEOUS CORONARY STENT INTERVENTION (PCI-S);  Surgeon: Sherren Mocha, MD;  Location: [facility] CATH LAB;  Service: Cardiovascular;  Laterality: N/A;    There were no vitals filed for this visit.  Visit Diagnosis:Other abnormalities of gait and mobility R26.89 Muscle weakness ( generalized) M62.81 Sara Garza, PT 08/02/2015 1:13 PM          Subjective Assessment - 04/12/15 1400    Subjective No new complaints today.   Currently in Pain? No/denies                         [facility] Adult PT Treatment/Exercise - 04/12/15 0001    Knee/Hip Exercises: Aerobic   Nustep L1 x 6 min   Knee/Hip Exercises: Standing   Hip Abduction Stengthening;Both;10 reps    Forward Step Up Both;2 sets;10 reps;Hand Hold: 2;Step Height: 6"   Rebounder 3 ways weight shifting x 1 minute each  PT assist with getting on/off   Knee/Hip Exercises: Seated   Long Arc Quad Strengthening;Both;10 reps;3 sets   [employer] Weight 1 lbs.   Marching Limitations seated 2x10   Marching Weights 1 lbs.   Abduction/Adduction  Both;2 sets;10 reps  ball squeeze   Sit to Sand --  Ankle pumps on wobble board x 3 min sitting                  PT Short Term Goals - 04/12/15 1438    PT SHORT TERM GOAL #2   Title Berg balance score >/= 45/56   Period Weeks   Status On-going  Will do apart of 10th visit.           PT Long Term Goals - 04/05/15 1153    PT LONG TERM GOAL #1   Title independent with HEP and how to progress herself   Time 8   Period Weeks   Status On-going   PT LONG TERM GOAL #3   Title feels 75% confident with ambulation due to increased strength   Time 8   Period Weeks   Status On-going  Plan - 04/12/15 1435    Clinical Impression Statement Reports her fear of falling drives her inability to do curbs well. Requires assistance of bil rails to perform the step up exercise. Current exercise program pt feels is challenging enough.    Pt will benefit from skilled therapeutic intervention in order to improve on the following deficits Decreased range of motion;Difficulty walking;Decreased strength;Decreased mobility;Decreased activity tolerance;Decreased endurance   Rehab Potential Good   Clinical Impairments Affecting Rehab Potential None   PT Frequency 2x / week   PT Duration 8 weeks   PT Treatment/Interventions Therapeutic exercise;Therapeutic activities;Balance training;Neuromuscular re-education;Patient/family education;Manual techniques   PT Next Visit Plan continue balance and gait training   Consulted and Agree with Plan of Care Patient        Problem List Patient Active Problem List   Diagnosis Date Noted  .  Fracture of hip, right, closed ([place]) 07/31/2013  . CAD (coronary artery disease) 07/31/2013  . CKD (chronic kidney disease), stage III 07/31/2013  . Closed right hip fracture ([place]) 07/31/2013  . Left bundle branch block 07/31/2013  . Severe calcific aortic stenosis 06/18/2010  . Diabetic polyneuropathy ([place]) 04/16/2010  . GLAUCOMA 01/06/2009  . CHEST PAIN, ATYPICAL 12/16/2008  . GANGLION CYST, JOINT 01/08/2008  . ROTATOR CUFF INJURY, RIGHT SHOULDER 11/25/2007  . SHOULDER PAIN, RIGHT 11/16/2007  . OSTEOARTHRITIS 02/02/2007  . Type 2 diabetes mellitus with renal manifestations, controlled ([place]) 10/24/2006  . Dyslipidemia 10/02/2006  . Essential hypertension 10/02/2006    Holliday Sheaffer, PTA 04/12/2015, 2:39 PM  [facility] [address] [place], [place], [address] Phone: [phone]   Fax:  [phone]  Name: Sara Garza MRN: [id number] Date of Birth: 28-Apr-1923

## 2015-04-13 ENCOUNTER — Encounter: Payer: Self-pay | Admitting: Internal Medicine

## 2015-04-14 ENCOUNTER — Ambulatory Visit: Payer: Medicare Other | Admitting: Physical Therapy

## 2015-04-14 ENCOUNTER — Encounter: Payer: Self-pay | Admitting: Physical Therapy

## 2015-04-14 DIAGNOSIS — S92322D Displaced fracture of second metatarsal bone, left foot, subsequent encounter for fracture with routine healing: Secondary | ICD-10-CM | POA: Diagnosis not present

## 2015-04-14 DIAGNOSIS — R531 Weakness: Secondary | ICD-10-CM | POA: Diagnosis not present

## 2015-04-14 DIAGNOSIS — R269 Unspecified abnormalities of gait and mobility: Secondary | ICD-10-CM | POA: Diagnosis not present

## 2015-04-14 DIAGNOSIS — M25572 Pain in left ankle and joints of left foot: Secondary | ICD-10-CM | POA: Diagnosis not present

## 2015-04-14 DIAGNOSIS — S92332D Displaced fracture of third metatarsal bone, left foot, subsequent encounter for fracture with routine healing: Secondary | ICD-10-CM | POA: Diagnosis not present

## 2015-04-14 DIAGNOSIS — R2689 Other abnormalities of gait and mobility: Secondary | ICD-10-CM

## 2015-04-14 DIAGNOSIS — M25562 Pain in left knee: Secondary | ICD-10-CM | POA: Diagnosis not present

## 2015-04-14 DIAGNOSIS — M6281 Muscle weakness (generalized): Secondary | ICD-10-CM

## 2015-04-14 NOTE — Therapy (Addendum)
[facility] [address] [place], [place], [address] Phone: [phone]   Fax:  [phone]  Physical Therapy Treatment  Patient Details  Name: Sara Garza MRN: [id number] Date of Birth: [birth year] Referring Provider: Dr. [name]  Encounter Date: 04/14/2015      PT End of Session - 04/14/15 1106    Visit Number 8   Number of Visits 10   Date for PT Re-Evaluation 05/02/15   PT Start Time 1101   PT Stop Time 1145   PT Time Calculation (min) 44 min   Activity Tolerance Patient tolerated treatment well   Behavior During Therapy [facility] for tasks assessed/performed      Past Medical History  Diagnosis Date  . Hyperlipidemia   . Hypertension   . Osteoporosis   . Diabetes mellitus   . Arthritis   . Carotid stenosis, left     50-60% stable    Past Surgical History  Procedure Laterality Date  . Cesarean section    . Appendectomy    . Foot surgery      [name]  . Cardiac valve replacement  04/29/2012  . Hip arthroplasty Right 07/31/2013    Procedure: ARTHROPLASTY BIPOLAR HIP;  Surgeon: [name], MD;  Location: [facility];  Service: Orthopedics;  Laterality: Right;  . Percutaneous coronary stent intervention (pci-s) N/A 01/02/2012    Procedure: PERCUTANEOUS CORONARY STENT INTERVENTION (PCI-S);  Surgeon: Sherren Mocha, MD;  Location: [facility] CATH LAB;  Service: Cardiovascular;  Laterality: N/A;    There were no vitals filed for this visit.  Visit Diagnosis:  Other abnormalities of gait and mobility R26.89 Muscle weakness ( generalized) M62.81 Earlie Counts, PT 08/02/2015 1:14 PM        Subjective Assessment - 04/14/15 1106    Subjective Pt reports aware of forefoot but no complain of pain. Pt noticed improvement with confidence with ambulation since start of PT   Pertinent History heart valve replacement - needs frequent rests   Patient Stated Goals improve balance                          [facility] Adult PT Treatment/Exercise - 04/14/15 0001    High Level Balance   High Level Balance Activities Side stepping;Backward walking;Other (comment)  with handhold assist    Knee/Hip Exercises: Aerobic   Nustep L1 x 6 min  seat#4, arms #7   Knee/Hip Exercises: Standing   Forward Step Up Both;2 sets;10 reps;Hand Hold: 2;Step Height: 6"   Rebounder 3 ways weight shifting x 1 minute each  PTA assist for getting up and down   Knee/Hip Exercises: Seated   Long Arc Quad Strengthening;Both;10 reps;3 sets   [employer] Weight 2 lbs.                  PT Short Term Goals - 04/12/15 1438    PT SHORT TERM GOAL #2   Title Berg balance score >/= 45/56   Period Weeks   Status On-going  Will do apart of 10th visit.           PT Long Term Goals - 04/05/15 1153    PT LONG TERM GOAL #1   Title independent with HEP and how to progress herself   Time 8   Period Weeks   Status On-going   PT LONG TERM GOAL #3   Title feels 75% confident with ambulation due to increased strength   Time 8  Period Weeks   Status On-going               Plan - 04/14/15 1109    Clinical Impression Statement Pt reports most difficult to negotiate curbs. Pt is fearful of weightbearing on left foot with stepping up /down, no complain of pain with task. Reqires both upper extremity support to be able to step up 6inch step. Pt will conti ue to benefit from skilled PT to improve functional strength and balance   Pt will benefit from skilled therapeutic intervention in order to improve on the following deficits Decreased range of motion;Difficulty walking;Decreased strength;Decreased mobility;Decreased activity tolerance;Decreased endurance   Rehab Potential Good   Clinical Impairments Affecting Rehab Potential Per patient reports -Sept 2016 fracture in left forefoot    PT Frequency 2x / week   PT Duration 8 weeks   PT Treatment/Interventions Therapeutic exercise;Therapeutic activities;Balance  training;Neuromuscular re-education;Patient/family education;Manual techniques   PT Next Visit Plan continue to practice weightbearing tolerance on left LE, balance and gait training   PT Home Exercise Plan stand hip exercises   Consulted and Agree with Plan of Care Patient        Problem List Patient Active Problem List   Diagnosis Date Noted  . Fracture of hip, right, closed ([place]) 07/31/2013  . CAD (coronary artery disease) 07/31/2013  . CKD (chronic kidney disease), stage III 07/31/2013  . Closed right hip fracture ([place]) 07/31/2013  . Left bundle branch block 07/31/2013  . Severe calcific aortic stenosis 06/18/2010  . Diabetic polyneuropathy ([place]) 04/16/2010  . GLAUCOMA 01/06/2009  . CHEST PAIN, ATYPICAL 12/16/2008  . GANGLION CYST, JOINT 01/08/2008  . ROTATOR CUFF INJURY, RIGHT SHOULDER 11/25/2007  . SHOULDER PAIN, RIGHT 11/16/2007  . OSTEOARTHRITIS 02/02/2007  . Type 2 diabetes mellitus with renal manifestations, controlled ([place]) 10/24/2006  . Dyslipidemia 10/02/2006  . Essential hypertension 10/02/2006    NAUMANN-HOUEGNIFIO,Abednego Yeates PTA 04/14/2015, 11:51 AM  [facility] [address], [place], [place], [address] Phone: [phone]   Fax:  [phone]  Name: Sara Garza MRN: [id number] Date of Birth: 1923/10/27

## 2015-04-15 ENCOUNTER — Ambulatory Visit (INDEPENDENT_AMBULATORY_CARE_PROVIDER_SITE_OTHER): Payer: Medicare Other | Admitting: Family Medicine

## 2015-04-15 ENCOUNTER — Encounter: Payer: Self-pay | Admitting: Family Medicine

## 2015-04-15 VITALS — BP 128/70 | HR 79 | Temp 98.2°F | Wt 163.0 lb

## 2015-04-15 DIAGNOSIS — R221 Localized swelling, mass and lump, neck: Secondary | ICD-10-CM

## 2015-04-15 MED ORDER — AMOXICILLIN-POT CLAVULANATE 875-125 MG PO TABS: 1.0000 | ORAL_TABLET | Freq: Two times a day (BID) | ORAL | Status: DC

## 2015-04-15 NOTE — Assessment & Plan Note (Signed)
1 cm area of induration under angle of jaw on R that is tender but not erythematous  May be a swollen LN No other adenopathy noted  Nl exam otherwise  Cover with augmentin as directed Warm compresses  Disc red flags to watch for- erythema /streaking/fever  F/u with PCP for re check next wk  May need CT and lab work if not improving

## 2015-04-15 NOTE — Patient Instructions (Signed)
I think you could have a swollen lymph node/? Infected  Use a warm gentle compress If it turns red-that could indicate a skin infection- let us know  Take the augmentin as directed  Call Dr Marthann Schiller office next week so he can re check this   If symptoms worsen however- please call and let us know immediately

## 2015-04-15 NOTE — Progress Notes (Signed)
Pre visit review using our clinic review tool, if applicable. No additional management support is needed unless otherwise documented below in the visit note. 

## 2015-04-15 NOTE — Progress Notes (Signed)
Subjective:    Patient ID: Sara Garza, female    DOB: [birth year], 82 y.o.   MRN: [id number]  HPI Here with discomfort/swelling under angle of R jaw Noticed it last night Is tender to the touch   Warm  Not red  No trauma   Ear - feels funny on the R  Throat is ok  Does not have cold symptoms  No fever  No cough   Of note-hx of valve repl and hip surgery- has to take abx prophylaxis for dental and other proceedures   Is not exp to cats   Patient Active Problem List   Diagnosis Date Noted  . Neck mass 04/15/2015  . Fracture of hip, right, closed ([place]) 07/31/2013  . CAD (coronary artery disease) 07/31/2013  . CKD (chronic kidney disease), stage III 07/31/2013  . Closed right hip fracture ([place]) 07/31/2013  . Left bundle branch block 07/31/2013  . Severe calcific aortic stenosis 06/18/2010  . Diabetic polyneuropathy ([place]) 04/16/2010  . GLAUCOMA 01/06/2009  . CHEST PAIN, ATYPICAL 12/16/2008  . GANGLION CYST, JOINT 01/08/2008  . ROTATOR CUFF INJURY, RIGHT SHOULDER 11/25/2007  . SHOULDER PAIN, RIGHT 11/16/2007  . OSTEOARTHRITIS 02/02/2007  . Type 2 diabetes mellitus with renal manifestations, controlled ([place]) 10/24/2006  . Dyslipidemia 10/02/2006  . Essential hypertension 10/02/2006   Past Medical History  Diagnosis Date  . Hyperlipidemia   . Hypertension   . Osteoporosis   . Diabetes mellitus   . Arthritis   . Carotid stenosis, left     50-60% stable   Past Surgical History  Procedure Laterality Date  . Cesarean section    . Appendectomy    . Foot surgery      [name]  . Cardiac valve replacement  04/29/2012  . Hip arthroplasty Right 07/31/2013    Procedure: ARTHROPLASTY BIPOLAR HIP;  Surgeon: [name], MD;  Location: [facility];  Service: Orthopedics;  Laterality: Right;  . Percutaneous coronary stent intervention (pci-s) N/A 01/02/2012    Procedure: PERCUTANEOUS CORONARY STENT INTERVENTION (PCI-S);  Surgeon: Sherren Mocha, MD;  Location: [facility] CATH LAB;   Service: Cardiovascular;  Laterality: N/A;   Social History  Substance Use Topics  . Smoking status: Passive Smoke Exposure - Never Smoker  . Smokeless tobacco: Never Used  . Alcohol Use: 0.0 oz/week    0 Standard drinks or equivalent per week     Comment: very seldom   Family History  Problem Relation Age of Onset  . Lung cancer    . COPD Father   . Cancer Father     lung cancer   Allergies  Allergen Reactions  . Statins Other (See Comments)    Muscle aches   Current Outpatient Prescriptions on File Prior to Visit  Medication Sig Dispense Refill  . amLODipine (NORVASC) 5 MG tablet TAKE 1 TABLET(5 MG) BY MOUTH DAILY 30 tablet 5  . amoxicillin (AMOXIL) 500 MG capsule TAKE 4 TABLETS ONE HOUR PRIOR TO DENTAL PROCEDURES 8 capsule 2  . aspirin EC 81 MG tablet Take 1 tablet (81 mg total) by mouth daily. Start after lovenox injections are done after 2 weeks    . bacitracin 500 UNIT/GM ointment Apply 1 application topically 2 (two) times daily.    . bimatoprost (LUMIGAN) 0.01 % SOLN Place 1 drop into both eyes at bedtime.     . Calcium Carb-Cholecalciferol (CALCIUM 500 +D) 500-400 MG-UNIT TABS Take 1 tablet by mouth daily.     . isosorbide mononitrate (IMDUR) 30 MG  24 hr tablet Take 1 tablet (30 mg total) by mouth daily. 90 tablet 3  . Multiple Vitamin (MULTIVITAMIN) tablet Take 1 tablet by mouth daily.      . nitroGLYCERIN (NITROSTAT) 0.4 MG SL tablet Place 1 tablet (0.4 mg total) under the tongue every 5 (five) minutes as needed for chest pain (chest pressure and tightness). 25 tablet 1  . ZETIA 10 MG tablet TAKE 1 TABLET BY MOUTH ONCE DAILY 90 tablet 1   No current facility-administered medications on file prior to visit.    Review of Systems Review of Systems  Constitutional: Negative for fever, appetite change, fatigue and unexpected weight change.  Eyes: Negative for pain and visual disturbance.  ENT neg for cong/rhinorrhea/ear drainage or ST Respiratory: Negative for cough  and shortness of breath.   Cardiovascular: Negative for cp or palpitations    Gastrointestinal: Negative for nausea, diarrhea and constipation.  Genitourinary: Negative for urgency and frequency.  Skin: Negative for pallor or rash   Neurological: Negative for weakness, light-headedness, numbness and headaches.  Hematological: Negative for adenopathy. Does not bruise/bleed easily.  Psychiatric/Behavioral: Negative for dysphoric mood. The patient is not nervous/anxious.         Objective:   Physical Exam  Constitutional: She appears well-developed and well-nourished. No distress.  overwt and well appearing   HENT:  Head: Normocephalic and atraumatic.  Right Ear: External ear normal.  Left Ear: External ear normal.  Nose: Nose normal.  Mouth/Throat: Oropharynx is clear and moist. No oropharyngeal exudate.  No acute dental abnormalities No tenderness or lesions or masses in mouth  Eyes: Conjunctivae and EOM are normal. Pupils are equal, round, and reactive to light. Right eye exhibits no discharge. Left eye exhibits no discharge. No scleral icterus.  Neck: Normal range of motion. Neck supple. No JVD present. No tracheal deviation present. No thyromegaly present.  1 cm mobile area of induration and tenderness under R angle of jaw  No erythema or skin interruption or rash   No other M or adenopathy noted   Cardiovascular: Normal rate and regular rhythm.   Pulmonary/Chest: Effort normal and breath sounds normal. No stridor. No respiratory distress. She has no wheezes. She has no rales.  Musculoskeletal: She exhibits no edema.  Lymphadenopathy:       Right axillary: No pectoral and no lateral adenopathy present.       Left axillary: No lateral adenopathy present.      Right: No supraclavicular and no epitrochlear adenopathy present.       Left: No supraclavicular and no epitrochlear adenopathy present.  Neurological: She is alert.  Skin: Skin is warm and dry. No rash noted. No erythema.  No pallor.  Psychiatric: She has a normal mood and affect.          Assessment & Plan:   Problem List Items Addressed This Visit      Other   Neck mass - Primary    1 cm area of induration under angle of jaw on R that is tender but not erythematous  May be a swollen LN No other adenopathy noted  Nl exam otherwise  Cover with augmentin as directed Warm compresses  Disc red flags to watch for- erythema /streaking/fever  F/u with PCP for re check next wk  May need CT and lab work if not improving

## 2015-04-17 DIAGNOSIS — Z85828 Personal history of other malignant neoplasm of skin: Secondary | ICD-10-CM | POA: Diagnosis not present

## 2015-04-17 DIAGNOSIS — D485 Neoplasm of uncertain behavior of skin: Secondary | ICD-10-CM | POA: Diagnosis not present

## 2015-04-19 ENCOUNTER — Ambulatory Visit: Payer: Medicare Other

## 2015-04-19 DIAGNOSIS — M6281 Muscle weakness (generalized): Secondary | ICD-10-CM

## 2015-04-19 DIAGNOSIS — R269 Unspecified abnormalities of gait and mobility: Secondary | ICD-10-CM | POA: Diagnosis not present

## 2015-04-19 DIAGNOSIS — R2689 Other abnormalities of gait and mobility: Secondary | ICD-10-CM

## 2015-04-19 DIAGNOSIS — R531 Weakness: Secondary | ICD-10-CM | POA: Diagnosis not present

## 2015-04-19 NOTE — Therapy (Addendum)
[facility] [address], [place], [place], [address] Phone: [phone]   Fax:  [phone]  Physical Therapy Treatment  Patient Details  Name: Sara Garza MRN: [id number] Date of Birth: [birth year] Referring Provider: Dr. [name]  Encounter Date: 04/19/2015      PT End of Session - 04/19/15 1132    Visit Number 9   Number of Visits 19   Date for PT Re-Evaluation 05/02/15   PT Start Time 1100   PT Stop Time 1142   PT Time Calculation (min) 42 min   Activity Tolerance Patient tolerated treatment well   Behavior During Therapy [facility] for tasks assessed/performed      Past Medical History  Diagnosis Date  . Hyperlipidemia   . Hypertension   . Osteoporosis   . Diabetes mellitus   . Arthritis   . Carotid stenosis, left     50-60% stable    Past Surgical History  Procedure Laterality Date  . Cesarean section    . Appendectomy    . Foot surgery      [name]  . Cardiac valve replacement  04/29/2012  . Hip arthroplasty Right 07/31/2013    Procedure: ARTHROPLASTY BIPOLAR HIP;  Surgeon: [name], MD;  Location: [facility];  Service: Orthopedics;  Laterality: Right;  . Percutaneous coronary stent intervention (pci-s) N/A 01/02/2012    Procedure: PERCUTANEOUS CORONARY STENT INTERVENTION (PCI-S);  Surgeon: Sherren Mocha, MD;  Location: [facility] CATH LAB;  Service: Cardiovascular;  Laterality: N/A;    There were no vitals filed for this visit.  Visit Diagnosis:  Other abnormalities of gait and mobility R26.89 Muscle weakness ( generalized) M62.81 Earlie Counts, PT 08/02/2015 1:16 PM        Subjective Assessment - 04/19/15 1114    Subjective Pt reports that she is feeling stronger.  MD released her from his care   Currently in Pain? No/denies            [facility] Fall PT Assessment - 04/19/15 0001    Assessment   Medical Diagnosis 2nd, 3rd 4th, 5th metatarsal fracture   Prior Therapy None   Precautions   Precautions Fall   Prior Function   Level of Independence Independent   Cognition   Overall Cognitive Status Within Functional Limits for tasks assessed   Strength   Overall Strength Deficits   Overall Strength Comments Rt knee 4+/5, Lt knee 4+/5, ankle 4+/5 DF   Right/Left Hip Right;Left   Right Hip Extension 4/5   Left Hip Flexion 4/5   Timed Up and Go Test   TUG Normal TUG   Normal TUG (seconds) 19   TUG Comments used a single point cane                     [facility] Adult PT Treatment/Exercise - 04/19/15 0001    Exercises   Exercises Shoulder   Knee/Hip Exercises: Aerobic   Nustep L1 x 6 min  seat#4, arms #7   Knee/Hip Exercises: Standing   Heel Raises 20 reps   Knee/Hip Exercises: Seated   Long Arc Quad Strengthening;Both;10 reps;3 sets   Long Arc Quad Weight 2 lbs.   Marching Limitations seated 2x10   Marching Weights 2 lbs.   Abduction/Adduction  Both;2 sets;10 reps  ball squeeze   Abd/Adduction Limitations abduction with red band 2x10   Shoulder Exercises: Seated   Flexion Strengthening;Both;20 reps;Weights   Flexion Weight (lbs) 1  PT Short Term Goals - 05-12-15 1115    PT SHORT TERM GOAL #2   Title Berg balance score >/= 45/56   Time 4   Period Weeks   Status On-going   PT SHORT TERM GOAL #3   Title feels 25% steadier as she walks with single point cane.    Status Achieved  60% improvement           PT Long Term Goals - 2015/05/12 1116    PT LONG TERM GOAL #1   Title independent with HEP and how to progress herself   Time 8   Period Weeks   Status On-going   PT LONG TERM GOAL #3   Title feels 75% confident with ambulation due to increased strength   Time 8   Period Weeks   Status On-going  60%               Plan - 05/12/2015 1116    Clinical Impression Statement Pt reports continued difficulty with ascending and descending curbs.  She is able to do this in the clinic with assistance from PT.  Pt  reports 60% overall improvement in endurance and stabiltiy with home and community tasks.  Pt demonstrates 4 to 4+/5 bilateral LE strength and TUG is 18 seconds.  Pt fatigues easily with exercise in the clininc.  Pt will continue to benefit from skilled PT for endurance, gait and balance training.     Pt will benefit from skilled therapeutic intervention in order to improve on the following deficits Decreased range of motion;Difficulty walking;Decreased strength;Decreased mobility;Decreased activity tolerance;Decreased endurance   Rehab Potential Good   PT Frequency 2x / week   PT Duration 8 weeks   PT Treatment/Interventions Therapeutic exercise;Therapeutic activities;Balance training;Neuromuscular re-education;Patient/family education;Manual techniques   PT Next Visit Plan continue to practice weightbearing tolerance on left LE, balance and gait training.  Berg Balance test to assess STG/LTG   Consulted and Agree with Plan of Care Patient          G-Codes - May 12, 2015 1113    Functional Assessment Tool Used TUG and clinical    Functional Limitation Other PT primary   Other PT Primary Goal Status [id number]) At least 1 percent but less than 20 percent impaired, limited or restricted   Other PT Primary Discharge Status [id number]) At least 20 percent but less than 40 percent impaired, limited or restricted    corrected codes: Other PT primary Current status: CJ Goal Status: CI Sigurd Sos, PT 05/30/2015 7:25 AM   Problem List Patient Active Problem List   Diagnosis Date Noted  . Neck mass 04/15/2015  . Fracture of hip, right, closed ([place]) 07/31/2013  . CAD (coronary artery disease) 07/31/2013  . CKD (chronic kidney disease), stage III 07/31/2013  . Closed right hip fracture ([place]) 07/31/2013  . Left bundle branch block 07/31/2013  . Severe calcific aortic stenosis 06/18/2010  . Diabetic polyneuropathy ([place]) 04/16/2010  . GLAUCOMA 01/06/2009  . CHEST PAIN, ATYPICAL 12/16/2008  . GANGLION  CYST, JOINT 01/08/2008  . ROTATOR CUFF INJURY, RIGHT SHOULDER 11/25/2007  . SHOULDER PAIN, RIGHT 11/16/2007  . OSTEOARTHRITIS 02/02/2007  . Type 2 diabetes mellitus with renal manifestations, controlled ([place]) 10/24/2006  . Dyslipidemia 10/02/2006  . Essential hypertension 10/02/2006   Physical Therapy Progress Note  Dates of Reporting Period: 03/07/15 to 05/12/15  Objective Reports of Subjective Statement: 60% overall improvement since the start of care  Objective Measurements: See above for MMT and TUG  Goal Update: see above  for current goal status  Plan: continue to build strength and endurance, balance and functional activity training for safety in the community and independence with community ambulation  Reason Skilled Services are Required: Pt with continued strength, balance and endurance deficits.  Pt will continue to benefit from safe progression of LE/UE strength and balance training.   Rogerio Boutelle, PT 04/19/2015, 11:37 AM  [facility] [address] [place], [place], [address] Phone: [phone]   Fax:  [phone]  Name: Sara Garza MRN: [id number] Date of Birth: 04/07/1923

## 2015-04-21 ENCOUNTER — Encounter: Payer: Self-pay | Admitting: Physical Therapy

## 2015-04-21 ENCOUNTER — Ambulatory Visit: Payer: Medicare Other | Admitting: Physical Therapy

## 2015-04-21 DIAGNOSIS — R2689 Other abnormalities of gait and mobility: Secondary | ICD-10-CM

## 2015-04-21 DIAGNOSIS — R531 Weakness: Secondary | ICD-10-CM | POA: Diagnosis not present

## 2015-04-21 DIAGNOSIS — M6281 Muscle weakness (generalized): Secondary | ICD-10-CM

## 2015-04-21 DIAGNOSIS — R269 Unspecified abnormalities of gait and mobility: Secondary | ICD-10-CM | POA: Diagnosis not present

## 2015-04-21 NOTE — Therapy (Addendum)
[facility] [address] [place], [place], [address] Phone: [phone]   Fax:  [phone]  Physical Therapy Treatment  Patient Details  Name: Sara Garza MRN: [id number] Date of Birth: [birth year] Referring Provider: Dr. [name]  Encounter Date: 04/21/2015      PT End of Session - 04/21/15 0943    Visit Number 10   Number of Visits 19   Date for PT Re-Evaluation 05/02/15   PT Start Time 0930   PT Stop Time 1015   PT Time Calculation (min) 45 min   Activity Tolerance Patient tolerated treatment well   Behavior During Therapy [facility] for tasks assessed/performed      Past Medical History  Diagnosis Date  . Hyperlipidemia   . Hypertension   . Osteoporosis   . Diabetes mellitus   . Arthritis   . Carotid stenosis, left     50-60% stable    Past Surgical History  Procedure Laterality Date  . Cesarean section    . Appendectomy    . Foot surgery      [name]  . Cardiac valve replacement  04/29/2012  . Hip arthroplasty Right 07/31/2013    Procedure: ARTHROPLASTY BIPOLAR HIP;  Surgeon: [name], MD;  Location: [facility];  Service: Orthopedics;  Laterality: Right;  . Percutaneous coronary stent intervention (pci-s) N/A 01/02/2012    Procedure: PERCUTANEOUS CORONARY STENT INTERVENTION (PCI-S);  Surgeon: Sherren Mocha, MD;  Location: [facility] CATH LAB;  Service: Cardiovascular;  Laterality: N/A;    There were no vitals filed for this visit.  Visit Diagnosis:  Other abnormalities of gait and mobility R26.89 Muscle weakness ( generalized) M62.81 Earlie Counts, PT 08/02/2015 1:17 PM        Subjective Assessment - 04/21/15 0931    Subjective Pt reports no major change since last visit   Pertinent History heart valve replacement - needs frequent rests   Patient Stated Goals improve balance   Currently in Pain? No/denies                         [facility] Adult PT Treatment/Exercise - 04/21/15 0001    Exercises   Exercises Knee/Hip   Knee/Hip Exercises: Aerobic   Nustep L1 x 6 min  seat#4, arms #7   Knee/Hip Exercises: Standing   Heel Raises 20 reps   Hip Abduction Stengthening;Both;20 reps  2# added   Hip Extension Stengthening;Both;20 reps  2#added   Forward Step Up Both;2 sets;10 reps;Hand Hold: 2;Step Height: 6"   Knee/Hip Exercises: Seated   Long Arc Quad Strengthening;Both;10 reps;3 sets   Long Arc Quad Weight 2 lbs.   Abduction/Adduction  Both;2 sets;10 reps  balll squezze                  PT Short Term Goals - 04/19/15 1115    PT SHORT TERM GOAL #2   Title Merrilee Jansky balance score >/= 45/56   Time 4   Period Weeks   Status On-going   PT SHORT TERM GOAL #3   Title feels 25% steadier as she walks with single point cane.    Status Achieved  60% improvement           PT Long Term Goals - 04/19/15 1116    PT LONG TERM GOAL #1   Title independent with HEP and how to progress herself   Time 8   Period Weeks   Status On-going   PT LONG TERM  GOAL #3   Title feels 75% confident with ambulation due to increased strength   Time 8   Period Weeks   Status On-going  60%               Plan - 04/21/15 0945    Clinical Impression Statement Pt continues to be concerned about negotiating curbs. Pt continues to progress with strength and endurance and progressing toward goals. Pt will continue to benfit from skilled PT for endurance, gait and balance training.   Pt will benefit from skilled therapeutic intervention in order to improve on the following deficits Decreased range of motion;Difficulty walking;Decreased strength;Decreased mobility;Decreased activity tolerance;Decreased endurance   Rehab Potential Good   Clinical Impairments Affecting Rehab Potential Per patient reports -Sept 2016 fracture in left forefoot    PT Frequency 2x / week   PT Duration 8 weeks   PT Treatment/Interventions Therapeutic exercise;Therapeutic activities;Balance  training;Neuromuscular re-education;Patient/family education;Manual techniques   PT Next Visit Plan continue to practice weightbearing tolerance on left LE, balance and gait training.  Berg Balance test to assess STG/LTG   PT Home Exercise Plan stand hip exercises   Consulted and Agree with Plan of Care Patient        Problem List Patient Active Problem List   Diagnosis Date Noted  . Neck mass 04/15/2015  . Fracture of hip, right, closed ([place]) 07/31/2013  . CAD (coronary artery disease) 07/31/2013  . CKD (chronic kidney disease), stage III 07/31/2013  . Closed right hip fracture ([place]) 07/31/2013  . Left bundle branch block 07/31/2013  . Severe calcific aortic stenosis 06/18/2010  . Diabetic polyneuropathy ([place]) 04/16/2010  . GLAUCOMA 01/06/2009  . CHEST PAIN, ATYPICAL 12/16/2008  . GANGLION CYST, JOINT 01/08/2008  . ROTATOR CUFF INJURY, RIGHT SHOULDER 11/25/2007  . SHOULDER PAIN, RIGHT 11/16/2007  . OSTEOARTHRITIS 02/02/2007  . Type 2 diabetes mellitus with renal manifestations, controlled ([place]) 10/24/2006  . Dyslipidemia 10/02/2006  . Essential hypertension 10/02/2006    NAUMANN-HOUEGNIFIO,Caryn Gienger PTA 04/21/2015, 10:10 AM  [facility] [address] [place], [place], [address] Phone: [phone]   Fax:  [phone]  Name: Sara Garza MRN: :[id number] Date of Birth: September 13, 1923

## 2015-04-24 ENCOUNTER — Encounter: Payer: Self-pay | Admitting: Physical Therapy

## 2015-04-24 ENCOUNTER — Ambulatory Visit: Payer: Medicare Other | Admitting: Physical Therapy

## 2015-04-24 DIAGNOSIS — M6281 Muscle weakness (generalized): Secondary | ICD-10-CM

## 2015-04-24 DIAGNOSIS — R2689 Other abnormalities of gait and mobility: Secondary | ICD-10-CM

## 2015-04-24 DIAGNOSIS — R531 Weakness: Secondary | ICD-10-CM | POA: Diagnosis not present

## 2015-04-24 DIAGNOSIS — R269 Unspecified abnormalities of gait and mobility: Secondary | ICD-10-CM | POA: Diagnosis not present

## 2015-04-24 NOTE — Therapy (Addendum)
[facility] [address], [place], [place], [address] Phone: [phone]   Fax:  [phone]  Physical Therapy Treatment  Patient Details  Name: Sara Garza MRN: [id number] Date of Birth: [birth year] Referring Provider: Dr. [name]  Encounter Date: 04/24/2015      PT End of Session - 04/24/15 1149    Visit Number 11   Number of Visits 19   Date for PT Re-Evaluation 05/02/15   PT Start Time 1146   PT Stop Time 1230   PT Time Calculation (min) 44 min   Activity Tolerance Patient tolerated treatment well   Behavior During Therapy [facility] for tasks assessed/performed      Past Medical History  Diagnosis Date  . Hyperlipidemia   . Hypertension   . Osteoporosis   . Diabetes mellitus   . Arthritis   . Carotid stenosis, left     50-60% stable    Past Surgical History  Procedure Laterality Date  . Cesarean section    . Appendectomy    . Foot surgery      [name]  . Cardiac valve replacement  04/29/2012  . Hip arthroplasty Right 07/31/2013    Procedure: ARTHROPLASTY BIPOLAR HIP;  Surgeon: [name], MD;  Location: [facility];  Service: Orthopedics;  Laterality: Right;  . Percutaneous coronary stent intervention (pci-s) N/A 01/02/2012    Procedure: PERCUTANEOUS CORONARY STENT INTERVENTION (PCI-S);  Surgeon: Sherren Mocha, MD;  Location: [facility] CATH LAB;  Service: Cardiovascular;  Laterality: N/A;    There were no vitals filed for this visit.  Visit Diagnosis:  Other abnormalities of gait and mobility R26.89 Muscle weakness ( generalized) M62.81 Sara Garza, PT 08/02/2015 1:19 PM        Subjective Assessment - 04/24/15 1148    Subjective Same, I feel ok.    Currently in Pain? No/denies   Multiple Pain Sites No            [facility] PT Assessment - 04/24/15 0001    Berg Balance Test   Sit to Stand Able to stand without using hands and stabilize independently   Standing Unsupported Able to stand safely 2  minutes   Sitting with Back Unsupported but Feet Supported on Floor or Stool Able to sit safely and securely 2 minutes   Stand to Sit Sits safely with minimal use of hands   Transfers Able to transfer safely, minor use of hands   Standing Unsupported with Eyes Closed Able to stand 10 seconds safely   Standing Ubsupported with Feet Together Able to place feet together independently and stand 1 minute safely   From Standing, Reach Forward with Outstretched Arm Can reach forward >12 cm safely (5")   From Standing Position, Pick up Object from Floor Able to pick up shoe, needs supervision   From Standing Position, Turn to Look Behind Over each Shoulder Looks behind one side only/other side shows less weight shift   Turn 360 Degrees Able to turn 360 degrees safely one side only in 4 seconds or less   Standing Unsupported, Alternately Place Feet on Step/Stool Able to complete >2 steps/needs minimal assist   Standing Unsupported, One Foot in Front Able to take small step independently and hold 30 seconds   Standing on One Leg Tries to lift leg/unable to hold 3 seconds but remains standing independently   Total Score 44  [place] Adult PT Treatment/Exercise - 04/24/15 0001    Berg Balance Test   Sit to Stand Able to stand without using hands and stabilize independently   Standing Unsupported Able to stand safely 2 minutes   Sitting with Back Unsupported but Feet Supported on Floor or Stool Able to sit safely and securely 2 minutes   Stand to Sit Sits safely with minimal use of hands   Transfers Able to transfer safely, minor use of hands   Standing Unsupported with Eyes Closed Able to stand 10 seconds safely   Standing Ubsupported with Feet Together Able to place feet together independently and stand 1 minute safely   From Standing, Reach Forward with Outstretched Arm Can reach forward >12 cm safely (5")   From Standing Position, Pick up Object from Floor Able to pick  up shoe, needs supervision   From Standing Position, Turn to Look Behind Over each Shoulder Looks behind one side only/other side shows less weight shift   Turn 360 Degrees Able to turn 360 degrees safely one side only in 4 seconds or less  Dizzy   Standing Unsupported, Alternately Place Feet on Step/Stool Able to complete >2 steps/needs minimal assist   Standing Unsupported, One Foot in Front Able to take small step independently and hold 30 seconds   Standing on One Leg Tries to lift leg/unable to hold 3 seconds but remains standing independently   Total Score 44   Knee/Hip Exercises: Aerobic   Nustep L1x 10 min   Knee/Hip Exercises: Seated   Sit to Sand --  Sitting: ankle board x 3 min                  PT Short Term Goals - 04/24/15 1226    PT SHORT TERM GOAL #2   Title Berg balance score >/= 45/56   Time 4   Period Weeks   Status --  44/45           PT Long Term Goals - 04/19/15 1116    PT LONG TERM GOAL #1   Title independent with HEP and how to progress herself   Time 8   Period Weeks   Status On-going   PT LONG TERM GOAL #3   Title feels 75% confident with ambulation due to increased strength   Time 8   Period Weeks   Status On-going  60%               Plan - 04/24/15 1230    Clinical Impression Statement Pt increased her BERG balance test to 44/45 today, only one point away from her STG.  She reports her fear is her most limiting factor. She will look into attending the [facility] bc she likes it there and they have a Nustep she would like to use.    Pt will benefit from skilled therapeutic intervention in order to improve on the following deficits Decreased range of motion;Difficulty walking;Decreased strength;Decreased mobility;Decreased activity tolerance;Decreased endurance   Rehab Potential Good   Clinical Impairments Affecting Rehab Potential Per patient reports -Sept 2016 fracture in left forefoot    PT Frequency 2x / week   PT  Treatment/Interventions Therapeutic exercise;Therapeutic activities;Balance training;Neuromuscular re-education;Patient/family education;Manual techniques   PT Next Visit Plan Standing balance and leg strength. See if pt went ahead to begin organizing her DC plan to the [facility] center.   Consulted and Agree with Plan of Care Patient        Problem List Patient Active Problem  List   Diagnosis Date Noted  . Neck mass 04/15/2015  . Fracture of hip, right, closed ([place]) 07/31/2013  . CAD (coronary artery disease) 07/31/2013  . CKD (chronic kidney disease), stage III 07/31/2013  . Closed right hip fracture ([place]) 07/31/2013  . Left bundle branch block 07/31/2013  . Severe calcific aortic stenosis 06/18/2010  . Diabetic polyneuropathy ([place]) 04/16/2010  . GLAUCOMA 01/06/2009  . CHEST PAIN, ATYPICAL 12/16/2008  . GANGLION CYST, JOINT 01/08/2008  . ROTATOR CUFF INJURY, RIGHT SHOULDER 11/25/2007  . SHOULDER PAIN, RIGHT 11/16/2007  . OSTEOARTHRITIS 02/02/2007  . Type 2 diabetes mellitus with renal manifestations, controlled ([place]) 10/24/2006  . Dyslipidemia 10/02/2006  . Essential hypertension 10/02/2006    Sara Garza,Sara Garza, Sara Garza 04/24/2015, 12:34 PM  [facility] [address] [place], [place], [address] Phone: [phone]   Fax:  [phone]  Name: Sara Garza MRN: [id number] Date of Birth: 1924-01-29

## 2015-04-26 ENCOUNTER — Ambulatory Visit: Payer: Medicare Other

## 2015-04-28 ENCOUNTER — Encounter: Payer: Self-pay | Admitting: Physical Therapy

## 2015-04-28 ENCOUNTER — Ambulatory Visit: Payer: Medicare Other | Admitting: Physical Therapy

## 2015-04-28 DIAGNOSIS — M6281 Muscle weakness (generalized): Secondary | ICD-10-CM

## 2015-04-28 DIAGNOSIS — R269 Unspecified abnormalities of gait and mobility: Secondary | ICD-10-CM | POA: Diagnosis not present

## 2015-04-28 DIAGNOSIS — R531 Weakness: Secondary | ICD-10-CM | POA: Diagnosis not present

## 2015-04-28 DIAGNOSIS — R2689 Other abnormalities of gait and mobility: Secondary | ICD-10-CM

## 2015-04-28 NOTE — Therapy (Addendum)
[facility] [address], [place], [place], [address] Phone: [phone]   Fax:  [phone]  Physical Therapy Treatment  Patient Details  Name: Sara Garza MRN: [id number] Date of Birth: 07-Oct-1923 Referring Provider: Dr. [name]  Encounter Date: 04/28/2015      PT End of Session - 04/28/15 1036    Visit Number 12   Number of Visits 19  Medicare   Date for PT Re-Evaluation 05/30/15   PT Start Time 4332   PT Stop Time 1055   PT Time Calculation (min) 40 min   Activity Tolerance Patient limited by fatigue;Other (comment)  out of breath alot   Behavior During Therapy [facility] for tasks assessed/performed      Past Medical History  Diagnosis Date  . Hyperlipidemia   . Hypertension   . Osteoporosis   . Diabetes mellitus   . Arthritis   . Carotid stenosis, left     50-60% stable    Past Surgical History  Procedure Laterality Date  . Cesarean section    . Appendectomy    . Foot surgery      [name]  . Cardiac valve replacement  04/29/2012  . Hip arthroplasty Right 07/31/2013    Procedure: ARTHROPLASTY BIPOLAR HIP;  Surgeon: [name], MD;  Location: [facility];  Service: Orthopedics;  Laterality: Right;  . Percutaneous coronary stent intervention (pci-s) N/A 01/02/2012    Procedure: PERCUTANEOUS CORONARY STENT INTERVENTION (PCI-S);  Surgeon: Sherren Mocha, MD;  Location: [facility] CATH LAB;  Service: Cardiovascular;  Laterality: N/A;    There were no vitals filed for this visit.  Visit Diagnosis:  Other abnormalities of gait and mobility R26.89 Muscle weakness ( generalized) M62.81 Earlie Counts, PT 08/02/2015 1:20 PM        Subjective Assessment - 04/28/15 1036    Subjective I have been out of breath more the past month.    Pertinent History heart valve replacement - needs frequent rests   Patient Stated Goals improve balance   Currently in Pain? No/denies            [facility] PT Assessment - 04/28/15  0001    Strength   Overall Strength Comments Rt knee 4+/5, Lt knee 4+/5, ankle 4+/5 DF   Right Hip Flexion 4+/5   Right Hip Extension 4/5   Right Hip ABduction 3+/5   Left Hip Flexion 4+/5   Left Hip Extension 4+/5   Left Hip ABduction 3+/5                     [facility] Adult PT Treatment/Exercise - 04/28/15 0001    Knee/Hip Exercises: Aerobic   Nustep L1x 10 min   Knee/Hip Exercises: Standing   Heel Raises 20 reps  2# on ankles   Hip Abduction Stengthening;Both;20 reps  2# added   Hip Extension Stengthening;Both;20 reps  2#added   Forward Step Up Both;2 sets;10 reps;Hand Hold: 2;Step Height: 6"   Other Standing Knee Exercises tandem stance 15 sec 1 time each way holding on with finger tips   Other Standing Knee Exercises single leg stance with fingertips 5 sec 3x each side                PT Education - 04/28/15 1053    Education provided No          PT Short Term Goals - 04/28/15 1038    PT SHORT TERM GOAL #1   Title indpendent with initial HEP  Time 4   Period Weeks   Status Achieved   PT SHORT TERM GOAL #2   Title Berg balance score >/= 45/56   Time 4   Period Weeks   Status --  44/56   PT SHORT TERM GOAL #3   Title feels 25% steadier as she walks with single point cane.    Time 4   Period Weeks   Status Achieved           PT Long Term Goals - 04/28/15 1042    PT LONG TERM GOAL #1   Title independent with HEP and how to progress herself   Time 8   Period Weeks   Status On-going  still learning exercises   PT LONG TERM GOAL #2   Title berg balance score is >/= 48/56   Time 8   Period Weeks   Status On-going  44/56   PT LONG TERM GOAL #3   Title feels 75% confident with ambulation due to increased strength   Time 8   Period Weeks   Status On-going  40% more conficent               Plan - 04/28/15 1055    Clinical Impression Statement Patient is a 82 year old female with weakness and balance issues. TUG score  increased to 19 sec. from 24 sec. Berg balance increased from 40/56 to 44/56.  Patient has to take many rests due to being out of breath.  Patient has weakness in bil. hips and knees.  Patient has met STG's with exception of Berg Balance goal.  Patient would benefit from  skilled PT to improve balance and strenght.    Pt will benefit from skilled therapeutic intervention in order to improve on the following deficits Decreased range of motion;Difficulty walking;Decreased strength;Decreased mobility;Decreased activity tolerance;Decreased endurance   Rehab Potential Good   Clinical Impairments Affecting Rehab Potential Per patient reports -Sept 2016 fracture in left forefoot    PT Frequency 2x / week   PT Duration 8 weeks   PT Treatment/Interventions Therapeutic exercise;Therapeutic activities;Balance training;Neuromuscular re-education;Patient/family education;Manual techniques   PT Next Visit Plan Standing balance and leg strength.    PT Home Exercise Plan progress as needed   Consulted and Agree with Plan of Care Patient        Problem List Patient Active Problem List   Diagnosis Date Noted  . Neck mass 04/15/2015  . Fracture of hip, right, closed ([place]) 07/31/2013  . CAD (coronary artery disease) 07/31/2013  . CKD (chronic kidney disease), stage III 07/31/2013  . Closed right hip fracture ([place]) 07/31/2013  . Left bundle branch block 07/31/2013  . Severe calcific aortic stenosis 06/18/2010  . Diabetic polyneuropathy ([place]) 04/16/2010  . GLAUCOMA 01/06/2009  . CHEST PAIN, ATYPICAL 12/16/2008  . GANGLION CYST, JOINT 01/08/2008  . ROTATOR CUFF INJURY, RIGHT SHOULDER 11/25/2007  . SHOULDER PAIN, RIGHT 11/16/2007  . OSTEOARTHRITIS 02/02/2007  . Type 2 diabetes mellitus with renal manifestations, controlled ([place]) 10/24/2006  . Dyslipidemia 10/02/2006  . Essential hypertension 10/02/2006    Earlie Counts, PT 04/28/2015 11:03 AM   [facility] [address], [place], [place], [address] Phone: [phone]   Fax:  [phone]  Name: Sara Garza MRN: [id number] Date of Birth: 1923/07/03

## 2015-04-29 ENCOUNTER — Other Ambulatory Visit: Payer: Self-pay | Admitting: Cardiology

## 2015-05-01 ENCOUNTER — Other Ambulatory Visit: Payer: Self-pay | Admitting: Cardiology

## 2015-05-01 NOTE — Telephone Encounter (Signed)
Rx request sent to pharmacy.  

## 2015-05-03 ENCOUNTER — Encounter: Payer: Self-pay | Admitting: Physical Therapy

## 2015-05-03 ENCOUNTER — Ambulatory Visit: Payer: Medicare Other | Attending: Sports Medicine | Admitting: Physical Therapy

## 2015-05-03 DIAGNOSIS — R262 Difficulty in walking, not elsewhere classified: Secondary | ICD-10-CM | POA: Insufficient documentation

## 2015-05-03 DIAGNOSIS — M6281 Muscle weakness (generalized): Secondary | ICD-10-CM

## 2015-05-03 DIAGNOSIS — R2689 Other abnormalities of gait and mobility: Secondary | ICD-10-CM | POA: Diagnosis not present

## 2015-05-03 NOTE — Therapy (Addendum)
[facility] [address] [place], [place], [address] Phone: [phone]   Fax:  [phone]  Physical Therapy Treatment  Patient Details  Name: Sara Garza MRN: [id number] Date of Birth: 03/01/1924 Referring Provider: Dr. [name]  Encounter Date: 05/03/2015      PT End of Session - 05/03/15 1113    Visit Number 13   Number of Visits 19   Date for PT Re-Evaluation 05/30/15   PT Start Time 1102   PT Stop Time 1145   PT Time Calculation (min) 43 min   Activity Tolerance Patient limited by fatigue;Other (comment);Patient tolerated treatment well   Behavior During Therapy [facility] for tasks assessed/performed      Past Medical History  Diagnosis Date  . Hyperlipidemia   . Hypertension   . Osteoporosis   . Diabetes mellitus   . Arthritis   . Carotid stenosis, left     50-60% stable    Past Surgical History  Procedure Laterality Date  . Cesarean section    . Appendectomy    . Foot surgery      [name]  . Cardiac valve replacement  04/29/2012  . Hip arthroplasty Right 07/31/2013    Procedure: ARTHROPLASTY BIPOLAR HIP;  Surgeon: [name], MD;  Location: [facility];  Service: Orthopedics;  Laterality: Right;  . Percutaneous coronary stent intervention (pci-s) N/A 01/02/2012    Procedure: PERCUTANEOUS CORONARY STENT INTERVENTION (PCI-S);  Surgeon: Sherren Mocha, MD;  Location: [facility] CATH LAB;  Service: Cardiovascular;  Laterality: N/A;    There were no vitals filed for this visit.  Visit Diagnosis:  Other abnormalities of gait and mobility R26.89 Muscle weakness ( generalized) M62.81 Earlie Counts, PT 08/02/2015 1:22 PM        Subjective Assessment - 05/03/15 1108    Subjective I do not feel my SOB is anything I need to see my MD about.    Currently in Pain? No/denies                         [facility] Adult PT Treatment/Exercise - 05/03/15 0001    Knee/Hip Exercises: Aerobic   Nustep L2 x 10  min   Knee/Hip Exercises: Standing   Heel Raises 20 reps  2# on ankles   Hip Abduction Stengthening;Both;20 reps  2# added   Hip Extension Stengthening;Both;20 reps  2#added   Rebounder 3 ways weight shifting x 1 minute each  PTA assist for getting up and down                PT Education - 05/03/15 1126    Education provided Yes   Education Details HEP   Person(s) Educated Patient   Methods Explanation;Demonstration   Comprehension Verbalized understanding;Returned demonstration          PT Short Term Goals - 04/28/15 1038    PT SHORT TERM GOAL #1   Title indpendent with initial HEP   Time 4   Period Weeks   Status Achieved   PT SHORT TERM GOAL #2   Title Berg balance score >/= 45/56   Time 4   Period Weeks   Status --  44/56   PT SHORT TERM GOAL #3   Title feels 25% steadier as she walks with single point cane.    Time 4   Period Weeks   Status Achieved           PT Long Term Goals - 04/28/15 1042  PT LONG TERM GOAL #1   Title independent with HEP and how to progress herself   Time 8   Period Weeks   Status On-going  still learning exercises   PT LONG TERM GOAL #2   Title berg balance score is >/= 48/56   Time 8   Period Weeks   Status On-going  44/56   PT LONG TERM GOAL #3   Title feels 75% confident with ambulation due to increased strength   Time 8   Period Weeks   Status On-going  40% more conficent               Plan - 2015/05/12 1132    Clinical Impression Statement Continued with LE strength, highlighting the weak muscles that showed up on her re-eval last session. She fatigues near the end of the workout, but this appears normal for her age. She has decided not to see/call MD about her SOB.  She  did report today that  her confidence is really improving  when she she is walking out of the home.    Pt will benefit from skilled therapeutic intervention in order to improve on the following deficits Decreased range of  motion;Difficulty walking;Decreased strength;Decreased mobility;Decreased activity tolerance;Decreased endurance   Rehab Potential Good   Clinical Impairments Affecting Rehab Potential Per patient reports -Sept 2016 fracture in left forefoot    PT Frequency 2x / week   PT Treatment/Interventions Therapeutic exercise;Therapeutic activities;Balance training;Neuromuscular re-education;Patient/family education;Manual techniques   PT Next Visit Plan Standing balance and leg strength.    Consulted and Agree with Plan of Care Patient        Problem List Patient Active Problem List   Diagnosis Date Noted  . Neck mass 04/15/2015  . Fracture of hip, right, closed ([place]) 07/31/2013  . CAD (coronary artery disease) 07/31/2013  . CKD (chronic kidney disease), stage III 07/31/2013  . Closed right hip fracture ([place]) 07/31/2013  . Left bundle branch block 07/31/2013  . Severe calcific aortic stenosis 06/18/2010  . Diabetic polyneuropathy ([place]) 04/16/2010  . GLAUCOMA 01/06/2009  . CHEST PAIN, ATYPICAL 12/16/2008  . GANGLION CYST, JOINT 01/08/2008  . ROTATOR CUFF INJURY, RIGHT SHOULDER 11/25/2007  . SHOULDER PAIN, RIGHT 11/16/2007  . OSTEOARTHRITIS 02/02/2007  . Type 2 diabetes mellitus with renal manifestations, controlled ([place]) 10/24/2006  . Dyslipidemia 10/02/2006  . Essential hypertension 10/02/2006    Alexxus Sobh, PTA May 12, 2015, 11:48 AM G-codes for D/C Other PT primary category Goal CI D/C status CJ  PHYSICAL THERAPY DISCHARGE SUMMARY  Visits from Start of Care: 13  Current functional level related to goals / functional outcomes: See above for current status.  Pt didn't return after PT on 05/12/2015.   Remaining deficits: Pt with remaining endurance and strength deficits.  Pt has HEP in place for continued gains.     Education / Equipment: HEP Plan: Patient agrees to discharge.  Patient goals were partially met. Patient is being discharged due to not returning since the last  visit.  ?????    Sigurd Sos, [place] 05/30/2015 7:23 AM    [facility] [address], [place], [place], [address] Phone: [phone]   Fax:  [phone]  Name: Sara Garza MRN: [id number] Date of Birth: [birth year]

## 2015-05-03 NOTE — Patient Instructions (Signed)
Knee High   Holding stable object, stand with better posture than in this picture, raise knee to hip level, then lower knee. Repeat with other knee. Complete __10_ repetitions. Do __1-2__ sessions per day.  ABDUCTION: Standing (Active)   Stand, feet flat. Lift right leg out to side. Complete __10_ repetitions. Perform __2_ sessions per day.       EXTENSION: Standing (Active)  Stand, both feet flat. Tap toe behind you keeping knee straight. Complete 10 repetitions. Perform __2_ sessions per day.  Copyright  VHI. All rights reserved.    Heel Raises    Stand with support. With knees straight, raise heels off ground. Hold __2_ seconds. Relax for 3___ seconds. Repeat _10-20__ times. Do __2_ times a day.  Copyright  VHI. All rights reserved.

## 2015-05-10 DIAGNOSIS — H353132 Nonexudative age-related macular degeneration, bilateral, intermediate dry stage: Secondary | ICD-10-CM | POA: Diagnosis not present

## 2015-05-10 DIAGNOSIS — H43813 Vitreous degeneration, bilateral: Secondary | ICD-10-CM | POA: Diagnosis not present

## 2015-05-10 DIAGNOSIS — Z961 Presence of intraocular lens: Secondary | ICD-10-CM | POA: Diagnosis not present

## 2015-05-10 DIAGNOSIS — H401132 Primary open-angle glaucoma, bilateral, moderate stage: Secondary | ICD-10-CM | POA: Diagnosis not present

## 2015-06-06 ENCOUNTER — Telehealth: Payer: Self-pay | Admitting: Internal Medicine

## 2015-06-06 NOTE — Telephone Encounter (Signed)
Pt would like to know if you would like her to do labs prior to her appointment 4/07 ? Pt would like you to know she has appointment with Dr Burt Knack on March 31, and not sure what labs he may do. pls advise

## 2015-06-06 NOTE — Telephone Encounter (Signed)
Spoke to pt, told her due to insurance can not have labs done prior to visit and this is 6 month follow up not a physical. Labs were done last in October so you do not have to fast. Pt verbalized understanding.

## 2015-06-07 ENCOUNTER — Other Ambulatory Visit: Payer: Self-pay | Admitting: Cardiovascular Disease

## 2015-06-20 ENCOUNTER — Other Ambulatory Visit: Payer: Self-pay

## 2015-06-20 ENCOUNTER — Ambulatory Visit (HOSPITAL_COMMUNITY): Payer: Medicare Other | Attending: Cardiovascular Disease

## 2015-06-20 DIAGNOSIS — N183 Chronic kidney disease, stage 3 (moderate): Secondary | ICD-10-CM | POA: Insufficient documentation

## 2015-06-20 DIAGNOSIS — I340 Nonrheumatic mitral (valve) insufficiency: Secondary | ICD-10-CM | POA: Diagnosis not present

## 2015-06-20 DIAGNOSIS — I350 Nonrheumatic aortic (valve) stenosis: Secondary | ICD-10-CM | POA: Diagnosis not present

## 2015-06-20 DIAGNOSIS — I25119 Atherosclerotic heart disease of native coronary artery with unspecified angina pectoris: Secondary | ICD-10-CM | POA: Diagnosis not present

## 2015-06-20 DIAGNOSIS — Z953 Presence of xenogenic heart valve: Secondary | ICD-10-CM | POA: Diagnosis not present

## 2015-06-20 DIAGNOSIS — I313 Pericardial effusion (noninflammatory): Secondary | ICD-10-CM | POA: Diagnosis not present

## 2015-06-20 DIAGNOSIS — E785 Hyperlipidemia, unspecified: Secondary | ICD-10-CM | POA: Diagnosis not present

## 2015-06-20 DIAGNOSIS — I447 Left bundle-branch block, unspecified: Secondary | ICD-10-CM | POA: Diagnosis not present

## 2015-06-20 DIAGNOSIS — I1310 Hypertensive heart and chronic kidney disease without heart failure, with stage 1 through stage 4 chronic kidney disease, or unspecified chronic kidney disease: Secondary | ICD-10-CM | POA: Diagnosis not present

## 2015-06-20 DIAGNOSIS — I351 Nonrheumatic aortic (valve) insufficiency: Secondary | ICD-10-CM | POA: Diagnosis not present

## 2015-06-20 DIAGNOSIS — E1122 Type 2 diabetes mellitus with diabetic chronic kidney disease: Secondary | ICD-10-CM | POA: Diagnosis not present

## 2015-06-30 ENCOUNTER — Encounter: Payer: Self-pay | Admitting: Cardiovascular Disease

## 2015-06-30 ENCOUNTER — Ambulatory Visit (INDEPENDENT_AMBULATORY_CARE_PROVIDER_SITE_OTHER): Payer: Medicare Other | Admitting: Cardiovascular Disease

## 2015-06-30 VITALS — BP 120/70 | HR 80 | Ht 60.0 in | Wt 167.6 lb

## 2015-06-30 DIAGNOSIS — I25119 Atherosclerotic heart disease of native coronary artery with unspecified angina pectoris: Secondary | ICD-10-CM

## 2015-06-30 DIAGNOSIS — I5033 Acute on chronic diastolic (congestive) heart failure: Secondary | ICD-10-CM

## 2015-06-30 DIAGNOSIS — I359 Nonrheumatic aortic valve disorder, unspecified: Secondary | ICD-10-CM

## 2015-06-30 MED ORDER — FUROSEMIDE 20 MG PO TABS: 20.0000 mg | ORAL_TABLET | Freq: Every day | ORAL | Status: DC

## 2015-06-30 NOTE — Patient Instructions (Addendum)
Medication Instructions:  Your physician has recommended you make the following change in your medication:  1. START Furosemide 20mg  take one tablet by mouth daily  Labwork: Your physician recommends that you return for lab work in: 2 WEEKS (BMP)  Testing/Procedures: No new orders.   Follow-Up: Your physician wants you to follow-up in: 6 MONTHS with Dr Burt Knack.  You will receive a reminder letter in the mail two months in advance. If you don't receive a letter, please call our office to schedule the follow-up appointment.   Any Other Special Instructions Will Be Listed Below (If Applicable).     If you need a refill on your cardiac medications before your next appointment, please call your pharmacy.

## 2015-06-30 NOTE — Progress Notes (Signed)
Cardiology Office Note Date:  06/30/2015   ID:  Sara Garza, DOB 07-Dec-1923, MRN [id number]  PCP:  Nyoka Cowden, MD  Cardiologist:  Sherren Mocha, MD    Chief Complaint  Patient presents with  . Hypertension    dizziness  . Coronary Artery Disease     History of Present Illness: Sara Garza is a 82 y.o. female who presents for follow-up evaluation. She underwent for [name] for treatment of severe symptomatic aortic stenosis in 2014. She was treated with a 23 mm Medtronic Corevalve through the moderate risk SurTAVI aortic stenosis trial. She also is followed for moderate coronary artery disease which was evaluated with pressure wire analysis of the RCA and LCx demonstrating no hemodynamic evidence of flow-obstruction. 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   His heels and daughter today. She complains of shortness of breath with activity. She denies chest pain or pressure, lightheadedness, orthopnea, or PND. She does have some lower extremity edema. She denies cough, fevers, or chills. The patient had a foot injury a few months ago and has been more limited since that time. She attributes deconditioning as a significant cause of her shortness of breath.    Past Medical History  Diagnosis Date  . Hyperlipidemia   . Hypertension   . Osteoporosis   . Diabetes mellitus   . Arthritis   . Carotid stenosis, left     50-60% stable    Past Surgical History  Procedure Laterality Date  . Cesarean section    . Appendectomy    . Foot surgery      [name]  . Cardiac valve replacement  04/29/2012  . Hip arthroplasty Right 07/31/2013    Procedure: ARTHROPLASTY BIPOLAR HIP;  Surgeon: [name], MD;  Location: [facility];  Service: Orthopedics;  Laterality: Right;  . Percutaneous coronary stent intervention (pci-s) N/A 01/02/2012    Procedure: PERCUTANEOUS CORONARY STENT  INTERVENTION (PCI-S);  Surgeon: Sherren Mocha, MD;  Location: [facility] CATH LAB;  Service: Cardiovascular;  Laterality: N/A;    Current Outpatient Prescriptions  Medication Sig Dispense Refill  . amLODipine (NORVASC) 5 MG tablet TAKE 1 TABLET(5 MG) BY MOUTH DAILY 30 tablet 5  . amoxicillin (AMOXIL) 500 MG capsule TAKE 4 CAPSULE BY MOUTH ONE HOURS PRIOR TO DENTAL PROCEDURES 8 capsule 1  . aspirin EC 81 MG tablet Take 1 tablet (81 mg total) by mouth daily. Start after lovenox injections are done after 2 weeks    . bimatoprost (LUMIGAN) 0.01 % SOLN Place 1 drop into both eyes at bedtime.     . Calcium Carb-Cholecalciferol (CALCIUM 500 +D) 500-400 MG-UNIT TABS Take 1 tablet by mouth daily.     . isosorbide mononitrate (IMDUR) 30 MG 24 hr tablet TAKE 1 TABLET BY MOUTH DAILY 90 tablet 0  . Multiple Vitamin (MULTIVITAMIN) tablet Take 1 tablet by mouth daily.      . nitroGLYCERIN (NITROSTAT) 0.4 MG SL tablet Place 1 tablet (0.4 mg total) under the tongue every 5 (five) minutes as needed for chest pain (chest pressure and tightness). 25 tablet 1  . ZETIA 10 MG tablet TAKE 1 TABLET BY MOUTH ONCE DAILY 90 tablet 1   No current facility-administered medications for this visit.    Allergies:   Statins   Social History:  The patient  reports that she has been passively smoking.  She has never used smokeless tobacco.  She reports that she drinks alcohol. She reports that she does not use illicit drugs.   Family History:  The patient's  family history includes COPD in her father; Cancer in her father.    ROS:  Please see the history of present illness.  Otherwise, review of systems is positive for Leg swelling, muscle pain, leg pain, balance problems.  All other systems are reviewed and negative.    PHYSICAL EXAM: VS:  BP 120/70 mmHg  Pulse 80  Ht 5' (1.524 m)  Wt 167 lb 9.6 oz (76.023 kg)  BMI 32.73 kg/m2 , BMI Body mass index is 32.73 kg/(m^2). GEN: Well nourished, well developed, pleasant elderly  woman in no acute distress HEENT: normal Neck: JVP is elevated, no masses. No carotid bruits Cardiac: RRR with 2/6 systolic murmur at the right upper sternal border. Diastolic               Respiratory:  clear to auscultation bilaterally, normal work of breathing GI: soft, nontender, nondistended, + BS MS: no deformity or atrophy Ext: 1+ pretibial edema, pedal pulses 2+= bilaterally Skin: warm and dry, no rash Neuro:  Strength and sensation are intact Psych: euthymic mood, full affect  EKG:  EKG is ordered today. The ekg ordered today shows normal sinus rhythm 81 bpm, left bundle branch block  Recent Labs: 01/04/2015: ALT 15; BUN 35*; Creatinine, Ser 1.33*; Hemoglobin 12.1; Platelets 224.0; Potassium 5.4*; Sodium 143; TSH 1.63   Lipid Panel     Component Value Date/Time   CHOL 178 01/04/2015 0853   TRIG 138.0 01/04/2015 0853   HDL 42.70 01/04/2015 0853   CHOLHDL 4 01/04/2015 0853   VLDL 27.6 01/04/2015 0853   LDLCALC 107* 01/04/2015 0853   LDLDIRECT 158.6 10/23/2012 1223      Wt Readings from Last 3 Encounters:  06/30/15 167 lb 9.6 oz (76.023 kg)  04/15/15 163 lb (73.936 kg)  01/06/15 165 lb (74.844 kg)     Cardiac Studies Reviewed: Echo 06/20/2015: Study Conclusions  - Left ventricle: The cavity size was normal. Wall thickness was  increased in a pattern of moderate LVH with severe basal septal  hypertrophy. Systolic function was normal. The estimated ejection  fraction was in the range of 60% to 65%. Wall motion was normal;  there were no regional wall motion abnormalities. Doppler  parameters are consistent with abnormal left ventricular  relaxation (grade 1 diastolic dysfunction). Doppler parameters  are consistent with elevated ventricular end-diastolic filling  pressure. - Aortic valve: A bioprosthesis was present. There was moderate  regurgitation. - Mitral valve: Moderately to severely calcified annulus.  Moderately thickened, moderately calcified  leaflets . The  findings are consistent with severe stenosis. There was moderate  regurgitation. Mean gradient (D): 12 mm Hg. - Left atrium: The atrium was moderately dilated. - Right ventricle: The cavity size was normal. Systolic function  was normal. - Pericardium, extracardiac: A trivial pericardial effusion was  identified.  Impressions:  - LVEF 65-70%.  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 There is no obvious thrombus within the  pseudoaneurysm..  A [name] valve has normal transaortic gradients, however there is  mild central and mild paravalvular leak, together estimated as  moderate aortic regurgitation.   ASSESSMENT AND PLAN: 1.  Acute on chronic diastolic heart failure, New York Heart Association functional class II: Patient has some evidence of volume overload on exam today. I reviewed her recent echo  study which shows normal LV systolic function, 2+ paravalvular regurgitation of for transcatheter aortic valve, and severe mitral valve stenosis. Reviewed findings and plan on conservative medical therapy. Will start furosemide 20 mg daily and check a metabolic panel in 2 weeks. Otherwise continue current medications.  2. Stenosis status post [name]: Patient appears stable at this time. Her aortic valve prosthesis is functionally unchanged. LV aneurysm has been present since the procedure without change.  3. Coronary artery disease: No anginal symptoms. Continue current medical therapy.  4. Essential hypertension: Blood pressure is well controlled on amlodipine and isosorbide  Current medicines are reviewed with the patient today.  The patient does not have concerns regarding medicines.  Labs/ tests ordered today include:  No orders of the defined types were placed in this encounter.    Disposition:   FU 6 months.   Deatra James, MD  06/30/2015 12:43 PM     [address], [place], [place]  [address] Phone: [phone]; Fax: [phone]

## 2015-07-01 ENCOUNTER — Other Ambulatory Visit: Payer: Self-pay | Admitting: Internal Medicine

## 2015-07-04 ENCOUNTER — Ambulatory Visit (INDEPENDENT_AMBULATORY_CARE_PROVIDER_SITE_OTHER): Payer: Medicare Other | Admitting: Internal Medicine

## 2015-07-04 ENCOUNTER — Encounter: Payer: Self-pay | Admitting: Internal Medicine

## 2015-07-04 VITALS — BP 130/64 | HR 75 | Temp 98.4°F | Resp 20 | Ht 60.0 in | Wt 165.0 lb

## 2015-07-04 DIAGNOSIS — E1142 Type 2 diabetes mellitus with diabetic polyneuropathy: Secondary | ICD-10-CM | POA: Diagnosis not present

## 2015-07-04 DIAGNOSIS — E1121 Type 2 diabetes mellitus with diabetic nephropathy: Secondary | ICD-10-CM | POA: Diagnosis not present

## 2015-07-04 DIAGNOSIS — I10 Essential (primary) hypertension: Secondary | ICD-10-CM | POA: Diagnosis not present

## 2015-07-04 DIAGNOSIS — I2510 Atherosclerotic heart disease of native coronary artery without angina pectoris: Secondary | ICD-10-CM | POA: Diagnosis not present

## 2015-07-04 DIAGNOSIS — I350 Nonrheumatic aortic (valve) stenosis: Secondary | ICD-10-CM | POA: Diagnosis not present

## 2015-07-04 LAB — HEMOGLOBIN A1C: Hgb A1c MFr Bld: 6.4 % (ref 4.6–6.5)

## 2015-07-04 NOTE — Patient Instructions (Signed)
Limit your sodium (Salt) intake  Cardiology follow-up as scheduled  Return in 6 months for follow-up  

## 2015-07-04 NOTE — Progress Notes (Signed)
Subjective:    Patient ID: Sara Garza, female    DOB: 06/08/1923, 82 y.o.   MRN: [id number]  HPI  Lab Results  Component Value Date   HGBA1C 6.1 01/04/2015   82 year old patient , history of calcific AS s/p  [name] who is followed closely by cardiology.  She has CAD, as well as chronic kidney disease.  She is a diet-controlled diabetic and last hemoglobin A1c was 6.1. Doing quite well today was seen by cardiology recently and was placed on furosemide 20 mg daily.  She has noted some frequent urination No new concerns or complaints. She has osteoarthritis  Past Medical History  Diagnosis Date  . Hyperlipidemia   . Hypertension   . Osteoporosis   . Diabetes mellitus   . Arthritis   . Carotid stenosis, left     50-60% stable    Social History   Social History  . Marital Status: Widowed    Spouse Name: N/A  . Number of Children: N/A  . Years of Education: N/A   Occupational History  . Not on file.   Social History Main Topics  . Smoking status: Passive Smoke Exposure - Never Smoker  . Smokeless tobacco: Never Used  . Alcohol Use: 0.0 oz/week    0 Standard drinks or equivalent per week     Comment: very seldom  . Drug Use: No  . Sexual Activity: Not Currently   Other Topics Concern  . Not on file   Social History Narrative    Past Surgical History  Procedure Laterality Date  . Cesarean section    . Appendectomy    . Foot surgery      [name]  . Cardiac valve replacement  04/29/2012  . Hip arthroplasty Right 07/31/2013    Procedure: ARTHROPLASTY BIPOLAR HIP;  Surgeon: [name], MD;  Location: [facility];  Service: Orthopedics;  Laterality: Right;  . Percutaneous coronary stent intervention (pci-s) N/A 01/02/2012    Procedure: PERCUTANEOUS CORONARY STENT INTERVENTION (PCI-S);  Surgeon: Sherren Mocha, MD;  Location: [facility] CATH LAB;  Service: Cardiovascular;  Laterality: N/A;    Family History  Problem Relation Age of Onset  . Lung cancer    . COPD Father     . Cancer Father     lung cancer    Allergies  Allergen Reactions  . Statins Other (See Comments)    Muscle aches    Current Outpatient Prescriptions on File Prior to Visit  Medication Sig Dispense Refill  . amLODipine (NORVASC) 5 MG tablet TAKE 1 TABLET(5 MG) BY MOUTH DAILY 90 tablet 5  . amoxicillin (AMOXIL) 500 MG capsule TAKE 4 CAPSULE BY MOUTH ONE HOURS PRIOR TO DENTAL PROCEDURES 8 capsule 1  . aspirin EC 81 MG tablet Take 1 tablet (81 mg total) by mouth daily. Start after lovenox injections are done after 2 weeks    . bimatoprost (LUMIGAN) 0.01 % SOLN Place 1 drop into both eyes at bedtime.     . Calcium Carb-Cholecalciferol (CALCIUM 500 +D) 500-400 MG-UNIT TABS Take 1 tablet by mouth daily.     . furosemide (LASIX) 20 MG tablet Take 1 tablet (20 mg total) by mouth daily. 90 tablet 3  . isosorbide mononitrate (IMDUR) 30 MG 24 hr tablet TAKE 1 TABLET BY MOUTH DAILY 90 tablet 0  . Multiple Vitamin (MULTIVITAMIN) tablet Take 1 tablet by mouth daily.      . nitroGLYCERIN (NITROSTAT) 0.4 MG SL tablet Place 1 tablet (0.4 mg total) under the  tongue every 5 (five) minutes as needed for chest pain (chest pressure and tightness). 25 tablet 1  . ZETIA 10 MG tablet TAKE 1 TABLET BY MOUTH ONCE DAILY 90 tablet 1   No current facility-administered medications on file prior to visit.    BP 130/64 mmHg  Pulse 75  Temp(Src) 98.4 F (36.9 C) (Oral)  Resp 20  Ht 5' (1.524 m)  Wt 165 lb (74.844 kg)  BMI 32.22 kg/m2  SpO2 95%    Review of Systems  Constitutional: Negative.   HENT: Negative for congestion, dental problem, hearing loss, rhinorrhea, sinus pressure, sore throat and tinnitus.   Eyes: Negative for pain, discharge and visual disturbance.  Respiratory: Negative for cough and shortness of breath.   Cardiovascular: Positive for leg swelling. Negative for chest pain and palpitations.  Gastrointestinal: Negative for nausea, vomiting, abdominal pain, diarrhea, constipation, blood in  stool and abdominal distention.  Genitourinary: Negative for dysuria, urgency, frequency, hematuria, flank pain, vaginal bleeding, vaginal discharge, difficulty urinating, vaginal pain and pelvic pain.  Musculoskeletal: Positive for arthralgias and gait problem. Negative for joint swelling.  Skin: Negative for rash.  Neurological: Negative for dizziness, syncope, speech difficulty, weakness, numbness and headaches.  Hematological: Negative for adenopathy.  Psychiatric/Behavioral: Negative for behavioral problems, dysphoric mood and agitation. The patient is not nervous/anxious.        Objective:   Physical Exam  Constitutional: She is oriented to person, place, and time. She appears well-developed and well-nourished.  HENT:  Head: Normocephalic.  Right Ear: External ear normal.  Left Ear: External ear normal.  Mouth/Throat: Oropharynx is clear and moist.  Eyes: Conjunctivae and EOM are normal. Pupils are equal, round, and reactive to light.  Neck: Normal range of motion. Neck supple. No thyromegaly present.  Cardiovascular: Normal rate, regular rhythm, normal heart sounds and intact distal pulses.   Pulmonary/Chest: Effort normal and breath sounds normal.  Abdominal: Soft. Bowel sounds are normal. She exhibits no mass. There is no tenderness.  Musculoskeletal: Normal range of motion.  Trace edema  Lymphadenopathy:    She has no cervical adenopathy.  Neurological: She is alert and oriented to person, place, and time.  Skin: Skin is warm and dry. No rash noted.  Psychiatric: She has a normal mood and affect. Her behavior is normal.          Assessment & Plan:  History of aortic stenosis, status post [name] Diet-controlled diabetes.  Will check a hemoglobin A1c Chronic kidney disease Courtney artery disease, stable  Follow cardiology Recheck here 6 months or as needed

## 2015-07-04 NOTE — Progress Notes (Signed)
Pre visit review using our clinic review tool, if applicable. No additional management support is needed unless otherwise documented below in the visit note. 

## 2015-07-07 ENCOUNTER — Ambulatory Visit: Payer: PRIVATE HEALTH INSURANCE | Admitting: Internal Medicine

## 2015-07-07 ENCOUNTER — Telehealth: Payer: Self-pay | Admitting: Internal Medicine

## 2015-07-07 NOTE — Telephone Encounter (Signed)
Left message on voicemail to call office. Hemoglobin A1c was 6.4.

## 2015-07-07 NOTE — Telephone Encounter (Signed)
Pt would like to have her lab results from Tuesday 4/4

## 2015-07-10 NOTE — Telephone Encounter (Signed)
Left detailed message on voicemail hemoglobin A1c was 6.4. Any questions please call office.

## 2015-07-11 ENCOUNTER — Telehealth: Payer: Self-pay | Admitting: Internal Medicine

## 2015-07-11 NOTE — Telephone Encounter (Signed)
Pt would like to know if she should do something special about her A1C b/c her next sch appt is not until Oct 2017.  Please lmom.

## 2015-07-11 NOTE — Telephone Encounter (Signed)
Spoke to pt, told her A1c was fine,just continue to watch diet and follow up in Oct as scheduled. Pt verbalized understanding.

## 2015-07-17 ENCOUNTER — Other Ambulatory Visit (INDEPENDENT_AMBULATORY_CARE_PROVIDER_SITE_OTHER): Payer: Medicare Other | Admitting: *Deleted

## 2015-07-17 DIAGNOSIS — I509 Heart failure, unspecified: Secondary | ICD-10-CM | POA: Diagnosis not present

## 2015-07-17 DIAGNOSIS — I5033 Acute on chronic diastolic (congestive) heart failure: Secondary | ICD-10-CM

## 2015-07-17 LAB — BASIC METABOLIC PANEL
BUN: 44 mg/dL — ABNORMAL HIGH (ref 7–25)
CO2: 26 mmol/L (ref 20–31)
Calcium: 8.5 mg/dL — ABNORMAL LOW (ref 8.6–10.4)
Chloride: 102 mmol/L (ref 98–110)
Creat: 1.51 mg/dL — ABNORMAL HIGH (ref 0.60–0.88)
GLUCOSE: 101 mg/dL — AB (ref 65–99)
POTASSIUM: 4 mmol/L (ref 3.5–5.3)
Sodium: 138 mmol/L (ref 135–146)

## 2015-07-19 ENCOUNTER — Telehealth: Payer: Self-pay | Admitting: Cardiovascular Disease

## 2015-07-19 NOTE — Telephone Encounter (Signed)
I spoke with the pt and made her aware of 07/17/2015 BMP results. The pt also inquired about FMLA forms for her daughter.  These forms were left with Dr Burt Knack for completion but he has been out of the office.  She would like to pick them up and take them to her PCP for completion.  Copy of lab and FMLA forms placed at the front desk for pick up.

## 2015-07-19 NOTE — Telephone Encounter (Signed)
Sara Garza is calling about some paper work that she left for Dr. Burt Knack to sign . Also she is asking about her labs . Please call   Thanks

## 2015-07-24 ENCOUNTER — Other Ambulatory Visit: Payer: Self-pay

## 2015-07-24 DIAGNOSIS — I5032 Chronic diastolic (congestive) heart failure: Secondary | ICD-10-CM

## 2015-07-29 ENCOUNTER — Other Ambulatory Visit: Payer: Self-pay | Admitting: Internal Medicine

## 2015-07-31 ENCOUNTER — Other Ambulatory Visit: Payer: Self-pay | Admitting: Cardiovascular Disease

## 2015-07-31 DIAGNOSIS — H903 Sensorineural hearing loss, bilateral: Secondary | ICD-10-CM | POA: Diagnosis not present

## 2015-08-02 NOTE — Addendum Note (Signed)
Addended by: Earlie Counts F on: 08/02/2015 01:01 PM   Modules accepted: Orders

## 2015-08-02 NOTE — Addendum Note (Signed)
Addended by: Earlie Counts F on: 08/02/2015 01:27 PM   Modules accepted: Orders

## 2015-08-09 ENCOUNTER — Telehealth: Payer: Self-pay | Admitting: Internal Medicine

## 2015-08-09 NOTE — Telephone Encounter (Signed)
That is ok with me  

## 2015-08-09 NOTE — Telephone Encounter (Signed)
Okay 

## 2015-08-09 NOTE — Telephone Encounter (Signed)
Pt would like to switch to [facility] from Dr Raliegh Ip.  Pt states due to availability only.  Pt has only seen Dr Raliegh Ip 2 times.  Used to be with Dr Arnoldo Morale. Pt is aware [name] specializes in age 80 and above and has heard good things.    Is that ok with you [name]?  OK with you dr Raliegh Ip?

## 2015-08-10 NOTE — Telephone Encounter (Signed)
Left message for pt to call back  °

## 2015-08-11 NOTE — Telephone Encounter (Signed)
Pt has been scheduled.  °

## 2015-08-16 ENCOUNTER — Other Ambulatory Visit: Payer: Medicare Other

## 2015-08-17 ENCOUNTER — Other Ambulatory Visit (INDEPENDENT_AMBULATORY_CARE_PROVIDER_SITE_OTHER): Payer: Medicare Other | Admitting: *Deleted

## 2015-08-17 DIAGNOSIS — I5032 Chronic diastolic (congestive) heart failure: Secondary | ICD-10-CM

## 2015-08-17 LAB — BASIC METABOLIC PANEL
BUN: 37 mg/dL — ABNORMAL HIGH (ref 7–25)
CO2: 25 mmol/L (ref 20–31)
CREATININE: 1.56 mg/dL — AB (ref 0.60–0.88)
Calcium: 9.1 mg/dL (ref 8.6–10.4)
Chloride: 101 mmol/L (ref 98–110)
Glucose, Bld: 133 mg/dL — ABNORMAL HIGH (ref 65–99)
Potassium: 4 mmol/L (ref 3.5–5.3)
Sodium: 138 mmol/L (ref 135–146)

## 2015-08-21 ENCOUNTER — Encounter: Payer: Self-pay | Admitting: Cardiovascular Disease

## 2015-08-21 NOTE — Telephone Encounter (Signed)
Follow Up ° °Pt returned call//  °

## 2015-08-21 NOTE — Telephone Encounter (Signed)
This encounter was created in error - please disregard.

## 2015-10-06 DIAGNOSIS — Z23 Encounter for immunization: Secondary | ICD-10-CM | POA: Diagnosis not present

## 2015-10-09 DIAGNOSIS — D225 Melanocytic nevi of trunk: Secondary | ICD-10-CM | POA: Diagnosis not present

## 2015-10-09 DIAGNOSIS — Z85828 Personal history of other malignant neoplasm of skin: Secondary | ICD-10-CM | POA: Diagnosis not present

## 2015-10-09 DIAGNOSIS — L820 Inflamed seborrheic keratosis: Secondary | ICD-10-CM | POA: Diagnosis not present

## 2015-10-09 DIAGNOSIS — L821 Other seborrheic keratosis: Secondary | ICD-10-CM | POA: Diagnosis not present

## 2015-10-09 DIAGNOSIS — C44729 Squamous cell carcinoma of skin of left lower limb, including hip: Secondary | ICD-10-CM | POA: Diagnosis not present

## 2015-11-10 DIAGNOSIS — H401112 Primary open-angle glaucoma, right eye, moderate stage: Secondary | ICD-10-CM | POA: Diagnosis not present

## 2015-11-10 DIAGNOSIS — Z0100 Encounter for examination of eyes and vision without abnormal findings: Secondary | ICD-10-CM | POA: Diagnosis not present

## 2015-11-10 DIAGNOSIS — E119 Type 2 diabetes mellitus without complications: Secondary | ICD-10-CM | POA: Diagnosis not present

## 2015-11-10 DIAGNOSIS — H401122 Primary open-angle glaucoma, left eye, moderate stage: Secondary | ICD-10-CM | POA: Diagnosis not present

## 2015-11-10 LAB — HM DIABETES EYE EXAM

## 2015-11-23 DIAGNOSIS — Z85828 Personal history of other malignant neoplasm of skin: Secondary | ICD-10-CM | POA: Diagnosis not present

## 2015-11-23 DIAGNOSIS — L570 Actinic keratosis: Secondary | ICD-10-CM | POA: Diagnosis not present

## 2015-11-30 DIAGNOSIS — N3000 Acute cystitis without hematuria: Secondary | ICD-10-CM | POA: Diagnosis not present

## 2015-12-16 DIAGNOSIS — J209 Acute bronchitis, unspecified: Secondary | ICD-10-CM | POA: Diagnosis not present

## 2015-12-19 ENCOUNTER — Encounter: Payer: Self-pay | Admitting: Adult Health

## 2015-12-19 ENCOUNTER — Ambulatory Visit (INDEPENDENT_AMBULATORY_CARE_PROVIDER_SITE_OTHER): Payer: Medicare Other | Admitting: Adult Health

## 2015-12-19 VITALS — BP 132/62 | Ht 60.0 in | Wt 166.9 lb

## 2015-12-19 DIAGNOSIS — Z7189 Other specified counseling: Secondary | ICD-10-CM | POA: Diagnosis not present

## 2015-12-19 DIAGNOSIS — E1121 Type 2 diabetes mellitus with diabetic nephropathy: Secondary | ICD-10-CM | POA: Diagnosis not present

## 2015-12-19 DIAGNOSIS — I10 Essential (primary) hypertension: Secondary | ICD-10-CM | POA: Diagnosis not present

## 2015-12-19 DIAGNOSIS — J209 Acute bronchitis, unspecified: Secondary | ICD-10-CM

## 2015-12-19 DIAGNOSIS — Z7689 Persons encountering health services in other specified circumstances: Secondary | ICD-10-CM

## 2015-12-19 DIAGNOSIS — I25119 Atherosclerotic heart disease of native coronary artery with unspecified angina pectoris: Secondary | ICD-10-CM

## 2015-12-19 LAB — POCT GLYCOSYLATED HEMOGLOBIN (HGB A1C): HEMOGLOBIN A1C: 5.6

## 2015-12-19 NOTE — Progress Notes (Signed)
Patient presents to clinic today to establish care.  She is a pleasant 82 year old female who  has a past medical history of Arthritis; CAD (coronary artery disease); Carotid stenosis, left; Diabetes mellitus; Glaucoma; Hyperlipidemia; Hypertension; and Osteoporosis.  Acute Concerns: Establish Care   Acute bronchitis - Was recently seen at Urgent care and treated with Azithromycin and Tessalon pearls for acute bronchitis. Chest x ray was negative for acute process. She has two days left of azithromycin and endorses started to feel better. She still has a semi productive cough but has not had any fevers or feeling acutely ill.   Chronic Issues: HTN - This is well controlled with her current medication regimen   DM - Diet controlled   CAD - Is followed closly by cardiology due to calcific AS s/p [name] as well as CAD  Health Maintenance: Dental -- Routine  Vision -- Routine Care  Immunizations -- UTD  Colonoscopy -- No longer needs  Mammogram -- 03/2015  PAP -- no Longer needs Bone Density -- 2014   Is followed by  - Cardiology - Dr. Burt Knack - every 6 months - Optho- Dr. Kathrin Penner  - Dermatology - Dr. Allyson Sabal     Past Medical History:  Diagnosis Date  . Arthritis   . Carotid stenosis, left    50-60% stable  . Diabetes mellitus   . Hyperlipidemia   . Hypertension   . Osteoporosis     Past Surgical History:  Procedure Laterality Date  . APPENDECTOMY    . CARDIAC VALVE REPLACEMENT  04/29/2012  . CESAREAN SECTION    . FOOT SURGERY     [name]  . HIP ARTHROPLASTY Right 07/31/2013   Procedure: ARTHROPLASTY BIPOLAR HIP;  Surgeon: [name], MD;  Location: [facility];  Service: Orthopedics;  Laterality: Right;  . PERCUTANEOUS CORONARY STENT INTERVENTION (PCI-S) N/A 01/02/2012   Procedure: PERCUTANEOUS CORONARY STENT INTERVENTION (PCI-S);  Surgeon: Sherren Mocha, MD;  Location: [facility] CATH LAB;  Service: Cardiovascular;  Laterality: N/A;    Current Outpatient  Prescriptions on File Prior to Visit  Medication Sig Dispense Refill  . amLODipine (NORVASC) 5 MG tablet TAKE 1 TABLET(5 MG) BY MOUTH DAILY 90 tablet 1  . amoxicillin (AMOXIL) 500 MG capsule TAKE 4 CAPSULE BY MOUTH ONE HOURS PRIOR TO DENTAL PROCEDURES 8 capsule 1  . aspirin EC 81 MG tablet Take 1 tablet (81 mg total) by mouth daily. Start after lovenox injections are done after 2 weeks    . bimatoprost (LUMIGAN) 0.01 % SOLN Place 1 drop into both eyes at bedtime.     . Calcium Carb-Cholecalciferol (CALCIUM 500 +D) 500-400 MG-UNIT TABS Take 1 tablet by mouth daily.     . furosemide (LASIX) 20 MG tablet Take 1 tablet (20 mg total) by mouth daily. 90 tablet 3  . isosorbide mononitrate (IMDUR) 30 MG 24 hr tablet TAKE 1 TABLET BY MOUTH DAILY 90 tablet 2  . Multiple Vitamin (MULTIVITAMIN) tablet Take 1 tablet by mouth daily.      . nitroGLYCERIN (NITROSTAT) 0.4 MG SL tablet Place 1 tablet (0.4 mg total) under the tongue every 5 (five) minutes as needed for chest pain (chest pressure and tightness). 25 tablet 1  . ZETIA 10 MG tablet TAKE 1 TABLET BY MOUTH ONCE DAILY 90 tablet 1   No current facility-administered medications on file prior to visit.     Allergies  Allergen Reactions  . Statins Other (See Comments)    Muscle aches  Family History  Problem Relation Age of Onset  . Lung cancer    . COPD Father   . Cancer Father     lung cancer    Social History   Social History  . Marital status: Widowed    Spouse name: N/A  . Number of children: N/A  . Years of education: N/A   Occupational History  . Not on file.   Social History Main Topics  . Smoking status: Passive Smoke Exposure - Never Smoker  . Smokeless tobacco: Never Used  . Alcohol use 0.0 oz/week     Comment: very seldom  . Drug use: No  . Sexual activity: Not Currently   Other Topics Concern  . Not on file   Social History Narrative  . No narrative on file    Review of Systems  Constitutional: Negative.     HENT: Negative.   Eyes: Negative.   Respiratory: Positive for cough and sputum production. Negative for hemoptysis, shortness of breath and wheezing.   Cardiovascular: Negative.   Gastrointestinal: Negative.   Genitourinary: Negative.   Musculoskeletal: Positive for back pain and joint pain. Negative for falls.  Skin: Negative.   Neurological: Negative.   Psychiatric/Behavioral: Negative.   All other systems reviewed and are negative.   BP 132/62   Ht 5' (1.524 m)   Wt 166 lb 14.4 oz (75.7 kg)   BMI 32.60 kg/m   Physical Exam  Constitutional: She is oriented to person, place, and time and well-developed, well-nourished, and in no distress. No distress.  HENT:  Head: Normocephalic and atraumatic.  Right Ear: External ear normal.  Left Ear: External ear normal.  Nose: Nose normal.  Mouth/Throat: Oropharynx is clear and moist. No oropharyngeal exudate.  Eyes: Conjunctivae and EOM are normal. Pupils are equal, round, and reactive to light. Right eye exhibits no discharge. Left eye exhibits no discharge. No scleral icterus.  Neck: Normal range of motion. Neck supple. No thyromegaly present.  Cardiovascular: Normal rate, regular rhythm, normal heart sounds and intact distal pulses.  Exam reveals no gallop and no friction rub.   No murmur heard. Pulmonary/Chest: Effort normal and breath sounds normal. No respiratory distress. She has no wheezes. She has no rales. She exhibits no tenderness.  Non productive cough   Musculoskeletal: Normal range of motion. She exhibits no edema, tenderness or deformity.  Lymphadenopathy:    She has no cervical adenopathy.  Neurological: She is alert and oriented to person, place, and time. Gait normal. GCS score is 15.  Skin: Skin is warm and dry. No rash noted. She is not diaphoretic. No erythema. No pallor.  Psychiatric: Mood, memory, affect and judgment normal.  Nursing note and vitals reviewed.  Assessment/Plan: 1. Encounter to establish  care - Follow up in 6 months for annual exam  - Follow up sooner if needed - Encouraged to stay active and eat healthy  2. Controlled type 2 diabetes mellitus with diabetic nephropathy, without long-term current use of insulin (HCC)  - POC HgB A1c - 5.6   3. Essential hypertension - Well controlled - Will continue to monitor   4. Acute bronchitis, unspecified organism - Continue with current plan  - Follow up if no continued improvement in the next 3-4 days - Can use Mucinex to help with her symptoms  Sara Peng, NP

## 2015-12-19 NOTE — Patient Instructions (Addendum)
It was a pleasure meeting you today!  Your A1c was 5.6   Follow up with me for your annual exam in about 6 months

## 2015-12-28 ENCOUNTER — Encounter: Payer: Self-pay | Admitting: Adult Health

## 2016-01-01 ENCOUNTER — Telehealth: Payer: Self-pay | Admitting: Cardiovascular Disease

## 2016-01-01 NOTE — Telephone Encounter (Signed)
I spoke with the pt and made her aware that she is not due for an echo at this time.  Echo was just performed in March 2017.  The pt is also having labs with her PCP and these are already ordered to be checked again in March 2018. If the pt needs labs at her office visit with Dr Burt Knack then they would not require fasting.

## 2016-01-01 NOTE — Telephone Encounter (Signed)
New message    Pt calling because she is concerned that she may need an echo and blood work done prior to her appt w/the doc. She is requesting orders be put in the system.

## 2016-01-03 ENCOUNTER — Ambulatory Visit: Payer: Medicare Other | Admitting: Internal Medicine

## 2016-01-09 ENCOUNTER — Ambulatory Visit (INDEPENDENT_AMBULATORY_CARE_PROVIDER_SITE_OTHER): Payer: Medicare Other

## 2016-01-09 DIAGNOSIS — Z23 Encounter for immunization: Secondary | ICD-10-CM

## 2016-01-17 ENCOUNTER — Ambulatory Visit (INDEPENDENT_AMBULATORY_CARE_PROVIDER_SITE_OTHER): Payer: Medicare Other | Admitting: Cardiovascular Disease

## 2016-01-17 VITALS — BP 122/54 | HR 82 | Ht 60.0 in | Wt 168.0 lb

## 2016-01-17 DIAGNOSIS — I25119 Atherosclerotic heart disease of native coronary artery with unspecified angina pectoris: Secondary | ICD-10-CM

## 2016-01-17 DIAGNOSIS — I10 Essential (primary) hypertension: Secondary | ICD-10-CM

## 2016-01-17 DIAGNOSIS — I209 Angina pectoris, unspecified: Secondary | ICD-10-CM

## 2016-01-17 DIAGNOSIS — I359 Nonrheumatic aortic valve disorder, unspecified: Secondary | ICD-10-CM

## 2016-01-17 MED ORDER — EZETIMIBE 10 MG PO TABS: 10.0000 mg | ORAL_TABLET | Freq: Every day | ORAL | 11 refills | Status: DC

## 2016-01-17 MED ORDER — ISOSORBIDE MONONITRATE ER 60 MG PO TB24: 60.0000 mg | ORAL_TABLET | Freq: Every day | ORAL | 11 refills | Status: DC

## 2016-01-17 NOTE — Progress Notes (Signed)
Cardiology Office Note Date:  01/19/2016   ID:  Sara Garza, DOB 1923-10-05, MRN [id number]  PCP:  [name], NP  Cardiologist:  Sherren Mocha, MD    Chief Complaint  Patient presents with  . Coronary Artery Disease     History of Present Illness: Sara Garza is a 82 y.o. female who presents for  follow-up evaluation. She underwent for [name] for treatment of severe symptomatic aortic stenosis in 2014. She was treated with a 23 mm Medtronic Corevalve through the moderate risk SurTAVI aortic stenosis trial. She also is followed for moderate coronary artery disease which was evaluated with pressure wire analysis of the RCA and LCx demonstrating no hemodynamic evidence of flow-obstruction. 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   Sara Garza is here alone today. She continues to have limitation from exertional dyspnea. Also admits to chest discomfort when she is active. Symptoms resolve with rest. No palpitations, orthopnea, or PND. Takes lasix most days but skips it when she has to be out.   Past Medical History:  Diagnosis Date  . Arthritis   . CAD (coronary artery disease)   . Carotid stenosis, left    50-60% stable  . Diabetes mellitus   . Glaucoma   . Hyperlipidemia   . Hypertension   . Osteoporosis     Past Surgical History:  Procedure Laterality Date  . ANOMALOUS PULMONARY VENOUS RETURN REPAIR, TOTAL    . APPENDECTOMY    . CARDIAC VALVE REPLACEMENT  04/29/2012  . Cataracts Bilateral   . CESAREAN SECTION    . FOOT SURGERY     [name]  . HIP ARTHROPLASTY Right 07/31/2013   Procedure: ARTHROPLASTY BIPOLAR HIP;  Surgeon: [name], MD;  Location: [facility];  Service: Orthopedics;  Laterality: Right;  . PERCUTANEOUS CORONARY STENT INTERVENTION (PCI-S) N/A 01/02/2012   Procedure: PERCUTANEOUS CORONARY STENT INTERVENTION (PCI-S);  Surgeon: Sherren Mocha, MD;   Location[facility] C[facility] LAB;  Service: Cardiovascular;  Laterality: N/A;    Current Outpatient Prescriptions  Medication Sig Dispense Refill  . amLODipine (NORVASC) 5 MG tablet TAKE 1 TABLET(5 MG) BY MOUTH DAILY 90 tablet 1  . amoxicillin (AMOXIL) 500 MG capsule TAKE 4 CAPSULE BY MOUTH ONE HOURS PRIOR TO DENTAL PROCEDURES 8 capsule 1  . aspirin EC 81 MG tablet Take 1 tablet (81 mg total) by mouth daily. Start after lovenox injections are done after 2 weeks    . bimatoprost (LUMIGAN) 0.01 % SOLN Place 1 drop into both eyes at bedtime.     . Calcium Carb-Cholecalciferol (CALCIUM 500 +D) 500-400 MG-UNIT TABS Take 1 tablet by mouth daily.     [name] ezetimibe (ZETIA) 10 MG tablet Take 1 tablet (10 mg total) by mouth daily. 30 tablet 11  . furosemide (LASIX) 20 MG tablet Take 1 tablet (20 mg total) by mouth daily. 90 tablet 3  . isosorbide mononitrate (IMDUR) 60 MG 24 hr tablet Take 1 tablet (60 mg total) by mouth daily. 30 tablet 11  . Multiple Vitamin (MULTIVITAMIN) tablet Take 1 tablet by mouth daily.      . nitroGLYCERIN (NITROSTAT) 0.4 MG SL tablet Place 1 tablet (0.4 mg total) under the tongue every 5 (five) minutes as needed for chest pain (chest pressure and tightness). 25 tablet 1   No current facility-administered medications for this visit.     Allergies:   Statins   Social  History:  The patient  reports that she is a non-smoker but has been exposed to tobacco smoke. She has never used smokeless tobacco. She reports that she drinks alcohol. She reports that she does not use drugs.   Family History:  The patient's  family history includes COPD in her father; Cancer in her father.    ROS:  Please see the history of present illness.  Otherwise, review of systems is positive for edema, hearing loss, cough, back pain, muscle pain, balance problems.  All other systems are reviewed and negative.    PHYSICAL EXAM: VS:  BP (!) 122/54   Pulse 82   Ht 5' (1.524 m)   Wt 76.2 kg (168 lb)   BMI 32.81  kg/m  , BMI Body mass index is 32.81 kg/m. GEN: Well nourished, well developed, in no acute distress  HEENT: normal  Neck: no JVD, no masses. No carotid bruits Cardiac: RRR with 2/6 systolic murmur at the RUSB               Respiratory:  clear to auscultation bilaterally, normal work of breathing GI: soft, nontender, nondistended, + BS MS: no deformity or atrophy  Ext: trace pretibial edema, pedal pulses 2+= bilaterally Skin: warm and dry, no rash Neuro:  Strength and sensation are intact Psych: euthymic mood, full affect  EKG:  EKG is ordered today. The ekg ordered today shows NSR 85 bpm, LBBB  Recent Labs: 08/17/2015: BUN 37; Creat 1.56; Potassium 4.0; Sodium 138   Lipid Panel     Component Value Date/Time   CHOL 178 01/04/2015 0853   TRIG 138.0 01/04/2015 0853   HDL 42.70 01/04/2015 0853   CHOLHDL 4 01/04/2015 0853   VLDL 27.6 01/04/2015 0853   LDLCALC 107 (H) 01/04/2015 0853   LDLDIRECT 158.6 10/23/2012 1223      Wt Readings from Last 3 Encounters:  01/17/16 76.2 kg (168 lb)  12/19/15 75.7 kg (166 lb 14.4 oz)  07/04/15 74.8 kg (165 lb)     Cardiac Studies Reviewed: 2D Echo 06-20-2015: Study Conclusions  - Left ventricle: The cavity size was normal. Wall thickness was   increased in a pattern of moderate LVH with severe basal septal   hypertrophy. Systolic function was normal. The estimated ejection   fraction was in the range of 60% to 65%. Wall motion was normal;   there were no regional wall motion abnormalities. Doppler   parameters are consistent with abnormal left ventricular   relaxation (grade 1 diastolic dysfunction). Doppler parameters   are consistent with elevated ventricular end-diastolic filling   pressure. - Aortic valve: A bioprosthesis was present. There was moderate   regurgitation. - Mitral valve: Moderately to severely calcified annulus.   Moderately thickened, moderately calcified leaflets . The   findings are consistent with severe  stenosis. There was moderate   regurgitation. Mean gradient (D): 12 mm Hg. - Left atrium: The atrium was moderately dilated. - Right ventricle: The cavity size was normal. Systolic function   was normal. - Pericardium, extracardiac: A trivial pericardial effusion was   identified.  Impressions:  - LVEF 65-70%.   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 There is no obvious thrombus within the   pseudoaneurysm..   A [name] valve has normal transaortic gradients, however there is   mild central and mild paravalvular leak, together estimated as   moderate aortic regurgitation.   ASSESSMENT  AND PLAN: 1.  Chronic diastolic heart failure: NYHA II. Advised to take lasix regularly, otherwise continue current medical therapy.  2. CAD With angina, CCS II: discussed options for evaluation versus escalation of medical therapy. Will increase isosorbide to 60 mg daily. Consider cath if symptoms progress. She is reluctant at her advanced age.  3. Aortic valve disease s/p [name]: mild-moderate aortic insufficiency by echo, not audible on exam. Otherwise normal valve function with normal gradients. Continue clinical FU and repeat echo prior to 6 month FU.  4. HTN: continue current Rx with exception of increased isosorbide.   Current medicines are reviewed with the patient today.  The patient does not have concerns regarding medicines.  Labs/ tests ordered today include:   Orders Placed This Encounter  Procedures  . EKG 12-Lead  . ECHOCARDIOGRAM COMPLETE    Disposition:   FU 3 months Richardson Dopp, [place] and 6 months with me  Signed, Sherren Mocha, MD  01/19/2016 11:05 PM    [address], [place], [place]  [address] Phone: [phone]; Fax: [phone]

## 2016-01-17 NOTE — Patient Instructions (Addendum)
Medication Instructions:  Your physician has recommended you make the following change in your medication:  1. INCREASE Isosorbide MN to 60mg  take one tablet by mouth daily  Labwork: No new orders.   Testing/Procedures: Your physician has requested that you have an echocardiogram in 6 MONTHS. Echocardiography is a painless test that uses sound waves to create images of your heart. It provides your doctor with information about the size and shape of your heart and how well your heart's chambers and valves are working. This procedure takes approximately one hour. There are no restrictions for this procedure.  Follow-Up: Your physician recommends that you schedule a follow-up appointment in: 3 MONTHS with Richardson Dopp PA-C  Your physician wants you to follow-up in: 6 MONTHS with Dr Burt Knack.  You will receive a reminder letter in the mail two months in advance. If you don't receive a letter, please call our office to schedule the follow-up appointment.   Any Other Special Instructions Will Be Listed Below (If Applicable).     If you need a refill on your cardiac medications before your next appointment, please call your pharmacy.

## 2016-01-19 ENCOUNTER — Encounter: Payer: Self-pay | Admitting: Cardiovascular Disease

## 2016-01-22 ENCOUNTER — Other Ambulatory Visit: Payer: Self-pay | Admitting: Internal Medicine

## 2016-02-15 ENCOUNTER — Ambulatory Visit (INDEPENDENT_AMBULATORY_CARE_PROVIDER_SITE_OTHER): Payer: Medicare Other | Admitting: Podiatry

## 2016-02-15 ENCOUNTER — Encounter: Payer: Self-pay | Admitting: Podiatry

## 2016-02-15 VITALS — BP 147/71 | HR 90 | Resp 16

## 2016-02-15 DIAGNOSIS — L603 Nail dystrophy: Secondary | ICD-10-CM

## 2016-02-15 DIAGNOSIS — M79676 Pain in unspecified toe(s): Secondary | ICD-10-CM

## 2016-02-15 DIAGNOSIS — L608 Other nail disorders: Secondary | ICD-10-CM | POA: Diagnosis not present

## 2016-02-15 DIAGNOSIS — L600 Ingrowing nail: Secondary | ICD-10-CM | POA: Diagnosis not present

## 2016-02-15 DIAGNOSIS — I25119 Atherosclerotic heart disease of native coronary artery with unspecified angina pectoris: Secondary | ICD-10-CM

## 2016-02-15 NOTE — Patient Instructions (Signed)

## 2016-02-15 NOTE — Progress Notes (Signed)
   Subjective:    Patient ID: Sara Garza, female    DOB: [birth year], 82 y.o.   MRN: [id number]  HPI    Review of Systems  Eyes: Positive for itching.  Genitourinary: Positive for frequency.  Musculoskeletal: Positive for gait problem.  Hematological: Bruises/bleeds easily.  All other systems reviewed and are negative.      Objective:   Physical Exam        Assessment & Plan:

## 2016-02-20 NOTE — Progress Notes (Signed)
Patient ID: Sara Garza, female   DOB: 11-05-1923, 82 y.o.   MRN: [id number] Subjective: Patient presents today for evaluation of pain in her toe(s). Patient states that her right great toenail is extremely painful due to the thickened nature of the nail. Patient states the pains been ongoing for several weeks and conservative management has been unsuccessful. Patient presents today for further treatment and evaluation  Objective:  General: Well developed, nourished, in no acute distress, alert and oriented x3   Dermatology: Skin is warm, dry and supple bilateral. Right great toenail appears to be hypertrophic, dystrophic, with evidence of pain on direct palpation. Pain on palpation noted to the border of the nail fold. The remaining nails appear unremarkable at this time. There are no open sores, lesions.  Vascular: Dorsalis Pedis artery and Posterior Tibial artery pedal pulses palpable. No lower extremity edema noted.   Neruologic: Grossly intact via light touch bilateral.  Musculoskeletal: Muscular strength within normal limits in all groups bilateral. Normal range of motion noted to all pedal and ankle joints.   Assesement: #1 onychodystrophy nail-painful right great toe #2 hyperkeratosis of right great toenail-painful #3 pain in right great toe   Plan of Care:  1. Patient evaluated.  2. Discussed treatment alternatives and plan of care. Explained nail avulsion procedure and post procedure course to patient. 3. Patient opted for permanent total nail avulsion.  4. Prior to procedure, local anesthesia infiltration utilized using 3 ml of a 50:50 mixture of 2% plain lidocaine and 0.5% plain marcaine in a normal hallux block fashion and a betadine prep performed.  5. Total permanent nail avulsion with chemical matrixectomy performed using [id number] applications of phenol followed by alcohol flush.  6. Light dressing applied. 7. Return to clinic in 2 weeks.   Dr. Edrick Kins [facility]

## 2016-02-28 ENCOUNTER — Ambulatory Visit (INDEPENDENT_AMBULATORY_CARE_PROVIDER_SITE_OTHER): Payer: Medicare Other | Admitting: Podiatry

## 2016-02-28 DIAGNOSIS — S91109D Unspecified open wound of unspecified toe(s) without damage to nail, subsequent encounter: Secondary | ICD-10-CM | POA: Diagnosis not present

## 2016-02-28 DIAGNOSIS — M79676 Pain in unspecified toe(s): Secondary | ICD-10-CM | POA: Diagnosis not present

## 2016-03-03 NOTE — Progress Notes (Signed)
Subjective: Patient presents today 2 weeks post total nail avulsion procedure. Patient states that the toe and nail fold is feeling much better.  Objective: Skin is warm, dry and supple. Nail bed and respective nail fold appears to be healing appropriately. Open wound to the associated nail fold with a granular wound base and moderate amount of fibrotic tissue. Minimal drainage noted. Mild erythema around the periungual region likely due to phenol chemical matricectomy.  Assessment: #1 postop permanent total nail avulsion #2 open wound periungual nail fold and nail bed of respective digit.   Plan of care: #1 patient was evaluated  #2 debridement of open wound was performed to the periungual border and nail fold of the respective toe using a currette. Antibiotic ointment and Band-Aid was applied. #3 patient is to return to clinic on a PRN  basis.     

## 2016-03-28 DIAGNOSIS — Z1231 Encounter for screening mammogram for malignant neoplasm of breast: Secondary | ICD-10-CM | POA: Diagnosis not present

## 2016-03-28 LAB — HM MAMMOGRAPHY

## 2016-03-29 ENCOUNTER — Encounter: Payer: Self-pay | Admitting: Internal Medicine

## 2016-04-19 ENCOUNTER — Ambulatory Visit: Payer: Medicare Other | Admitting: Physician Assistant

## 2016-04-19 ENCOUNTER — Telehealth: Payer: Self-pay | Admitting: Cardiovascular Disease

## 2016-04-19 NOTE — Telephone Encounter (Signed)
I spoke with the pt and made her aware that she is not due for Echocardiogram until April.  The pt was advised to keep her upcoming appointment on 04/30/16 with Richardson Dopp PA-C.

## 2016-04-19 NOTE — Telephone Encounter (Signed)
Sara Garza is calling to see about an echo that she is supposed to have before a visit with Dr. Burt Knack . Please call

## 2016-04-19 NOTE — Progress Notes (Deleted)
Cardiology Office Note:    Date:  04/19/2016   ID:  Sara Garza, DOB [birth year], MRN [id number]  PCP:  [name], NP  Cardiologist:  ***  Electrophysiologist:  ***  Referring MD: [name], NP   No chief complaint on file. ***  History of Present Illness:    Sara Garza is a 83 y.o. female with a hx of valvular heart disease with aortic stenosis, mitral stenosis and mitral regurgitation as well as CAD, carotid artery disease, DM, HTN, HL, glaucoma.  She underwent [name] in 2014 (mod risk SurTAVI aortic stenosis trial).  She underwent pressure wire analysis of mod CAD in the LCx and RCA in 2013 that was neg.  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    Last seen by Dr. Sherren Mocha 10/17.  She continued to have severe limitation from dyspnea on exertion.  She also complained of exertional chest pain.  Her nitrates were increased.  She returns for Cardiology follow up.  ***     Prior CV studies that were reviewed today include:    Echo 06/20/15 Mod LVH, severe basal septal hypertrophy, EF 60-65, no RWMA, Gr 1 DD, [name] with mod AI (mild central and mild perivalvular), mod to severe MAC, severe MS (mean 12), mod MR, mod LAE, normal RVSF, trivial eff  Echo 2/16 [facility]) EF 60-65, focal mid ventricular aneurysm, severe asymmetric septal hypertrophy, severe MAC, mod to severe MS  LHC 8/13 LAD proximal 40-50, ostial D1 70 LCx mid 70 RCA proximal 60-70, mid 80 FFR of LCx and RCA neg (12/2011)  Echo 7/13 Mod LVH, EF 55-60, no RWMA, Gr 1 DD, severe AS, mild to mod MS, mild MR, mod LAE, PASP 40, small eff  Carotid US 2/13 (VVS) R 1-39; L 40-59  Myoview 9/10 Normal   Past Medical History:  Diagnosis Date  . Arthritis   . CAD (coronary artery disease)   . Carotid stenosis, left    50-60% stable  . Diabetes mellitus   . Glaucoma   . Hyperlipidemia   . Hypertension   .  Osteoporosis     Past Surgical History:  Procedure Laterality Date  . ANOMALOUS PULMONARY VENOUS RETURN REPAIR, TOTAL    . APPENDECTOMY    . CARDIAC VALVE REPLACEMENT  04/29/2012  . Cataracts Bilateral   . CESAREAN SECTION    . FOOT SURGERY     [name]  . HIP ARTHROPLASTY Right 07/31/2013   Procedure: ARTHROPLASTY BIPOLAR HIP;  Surgeon: [name], MD;  Location: [facility]S;  Service: Orthopedics;  Laterality: Right;  . PERCUTANEOUS CORONARY STENT INTERVENTION (PCI-S) N/A 01/02/2012   Procedure: PERCUTANEOUS CORONARY STENT INTERVENTION (PCI-S);  Surgeon: Sherren Mocha, MD;  Location: [facility] CATH LAB;  Service: Cardiovascular;  Laterality: N/A;    Current Medications: No outpatient prescriptions have been marked as taking for the 04/19/16 encounter (Appointment) with Liliane Shi, PA-C.     Allergies:   Statins   Social History   Social History  . Marital status: Widowed    Spouse name: N/A  . Number of children: N/A  . Years of education: N/A   Social History Main Topics  . Smoking status: Passive Smoke Exposure - Never Smoker  . Smokeless tobacco: Never Used  . Alcohol use 0.0 oz/week     Comment: very seldom  . Drug use: No  . Sexual activity: Not Currently  Other Topics Concern  . Not on file   Social History Narrative   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      She likes to read and she takes classes at [employer]     Family History:  The patient's ***family history includes COPD in her father; Cancer in her father.   ROS:   Please see the history of present illness.    ROS All other systems reviewed and are negative.   EKGs/Labs/Other Test Reviewed:    EKG:  EKG is *** ordered today.  The ekg ordered today demonstrates ***  Recent Labs: 08/17/2015: BUN 37; Creat 1.56; Potassium 4.0; Sodium 138   Recent Lipid Panel    Component Value Date/Time   CHOL 178 01/04/2015 0853   TRIG 138.0 01/04/2015 0853   HDL 42.70  01/04/2015 0853   CHOLHDL 4 01/04/2015 0853   VLDL 27.6 01/04/2015 0853   LDLCALC 107 (H) 01/04/2015 0853   LDLDIRECT 158.6 10/23/2012 1223     Physical Exam:    VS:  There were no vitals taken for this visit.    Wt Readings from Last 3 Encounters:  01/17/16 168 lb (76.2 kg)  12/19/15 166 lb 14.4 oz (75.7 kg)  07/04/15 165 lb (74.8 kg)     ***Physical Exam  ASSESSMENT:    No diagnosis found. PLAN:    In order of problems listed above:  1. CAD - *** 2. Chronic diastolic CHF - *** 3. Valvular heart disease - s/p [name] with mild to mod AI.  She has severe MS and mild MR as well.  She is due for follow up echocardiogram in 3 mos.*** 4. HTN - ***   Medication Adjustments/Labs and Tests Ordered: Current medicines are reviewed at length with the patient today.  Concerns regarding medicines are outlined above.  Medication changes, Labs and Tests ordered today are outlined in the Patient Instructions noted below. There are no Patient Instructions on file for this visit. Signed, Richardson Dopp, PA-C  04/19/2016 8:24 AM    [facility] [address], [place], [place]  [address] Phone: [phone]; Fax: [phone]

## 2016-04-25 ENCOUNTER — Encounter: Payer: Self-pay | Admitting: *Deleted

## 2016-04-26 ENCOUNTER — Telehealth: Payer: Self-pay | Admitting: Adult Health

## 2016-04-26 DIAGNOSIS — H353132 Nonexudative age-related macular degeneration, bilateral, intermediate dry stage: Secondary | ICD-10-CM | POA: Diagnosis not present

## 2016-04-26 DIAGNOSIS — Z961 Presence of intraocular lens: Secondary | ICD-10-CM | POA: Diagnosis not present

## 2016-04-26 DIAGNOSIS — E119 Type 2 diabetes mellitus without complications: Secondary | ICD-10-CM | POA: Diagnosis not present

## 2016-04-26 DIAGNOSIS — H401132 Primary open-angle glaucoma, bilateral, moderate stage: Secondary | ICD-10-CM | POA: Diagnosis not present

## 2016-04-26 NOTE — Telephone Encounter (Signed)
error 

## 2016-04-30 ENCOUNTER — Ambulatory Visit (INDEPENDENT_AMBULATORY_CARE_PROVIDER_SITE_OTHER): Payer: Medicare Other | Admitting: Physician Assistant

## 2016-04-30 ENCOUNTER — Encounter: Payer: Self-pay | Admitting: Physician Assistant

## 2016-04-30 VITALS — BP 138/42 | HR 78 | Ht 60.0 in | Wt 171.4 lb

## 2016-04-30 DIAGNOSIS — M79604 Pain in right leg: Secondary | ICD-10-CM

## 2016-04-30 DIAGNOSIS — I38 Endocarditis, valve unspecified: Secondary | ICD-10-CM | POA: Diagnosis not present

## 2016-04-30 DIAGNOSIS — I10 Essential (primary) hypertension: Secondary | ICD-10-CM

## 2016-04-30 DIAGNOSIS — I25119 Atherosclerotic heart disease of native coronary artery with unspecified angina pectoris: Secondary | ICD-10-CM

## 2016-04-30 DIAGNOSIS — I5032 Chronic diastolic (congestive) heart failure: Secondary | ICD-10-CM

## 2016-04-30 DIAGNOSIS — I209 Angina pectoris, unspecified: Secondary | ICD-10-CM

## 2016-04-30 DIAGNOSIS — M79605 Pain in left leg: Secondary | ICD-10-CM

## 2016-04-30 MED ORDER — METOPROLOL SUCCINATE ER 25 MG PO TB24: 25.0000 mg | ORAL_TABLET | Freq: Every day | ORAL | 3 refills | Status: DC

## 2016-04-30 NOTE — Progress Notes (Signed)
Cardiology Office Note:    Date:  04/30/2016   ID:  Sara Garza, DOB Feb 06, 1924, MRN [id number]  PCP:  [name], NP  Cardiologist:  Dr. Sherren Mocha   Electrophysiologist:  n/a  Referring MD: [name], NP   Chief Complaint  Patient presents with  . Follow-up    valvular heart disease, CAD    History of Present Illness:    Sara Garza is a 83 y.o. female with a hx of valvular heart disease with aortic stenosis, mitral stenosis and mitral regurgitation as well as CAD, carotid artery disease, DM, HTN, HL, glaucoma.  She underwent [name] in 2014 (mod risk SurTAVI aortic stenosis trial).  She underwent pressure wire analysis of mod CAD in the LCx and RCA in 2013 that was neg.  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    Last seen by Dr. Sherren Mocha 10/17.  She continued to have severe limitation from dyspnea on exertion.  She also complained of exertional chest pain.  Her nitrates were increased.  She returns for Cardiology follow up.  She is here alone.  She continues to complain of dyspnea on exertion with minimal activities.  She also notes exertional chest pain.  She feels like her symptoms have worsened over time.  She denies rest symptoms.  She denies syncope, orthopnea, PND.  Her LE edema is unchanged.  She notes bilateral leg pain with activity.  She denies wounds on her feet.  Prior CV studies that were reviewed today include:    Echo 06/20/15 Mod LVH, severe basal septal hypertrophy, EF 60-65, no RWMA, Gr 1 DD, [name] with mod AI (mild central and mild perivalvular), mod to severe MAC, severe MS (mean 12), mod MR, mod LAE, normal RVSF, trivial eff  Echo 2/16 Sara Garza) EF 60-65, focal mid ventricular aneurysm, severe asymmetric septal hypertrophy, severe MAC, mod to severe MS  LHC 8/13 LAD proximal 40-50, ostial D1 70 LCx mid 70 RCA proximal 60-70, mid 80 FFR of LCx  and RCA neg (12/2011)  Echo 7/13 Mod LVH, EF 55-60, no RWMA, Gr 1 DD, severe AS, mild to mod MS, mild MR, mod LAE, PASP 40, small eff  Carotid US 2/13 (VVS) R 1-39; L 40-59  Myoview 9/10 Normal   Past Medical History:  Diagnosis Date  . Arthritis   . CAD (coronary artery disease)   . Carotid stenosis, left    50-60% stable  . Diabetes mellitus   . Glaucoma   . Hyperlipidemia   . Hypertension   . Osteoporosis     Past Surgical History:  Procedure Laterality Date  . ANOMALOUS PULMONARY VENOUS RETURN REPAIR, TOTAL    . APPENDECTOMY    . CARDIAC VALVE REPLACEMENT  04/29/2012  . Cataracts Bilateral   . CESAREAN SECTION    . FOOT SURGERY     [name]  . HIP ARTHROPLASTY Right 07/31/2013   Procedure: ARTHROPLASTY BIPOLAR HIP;  Surgeon: [name], MD;  Location: [facility];  Service: Orthopedics;  Laterality: Right;  . PERCUTANEOUS CORONARY STENT INTERVENTION (PCI-S) N/A 01/02/2012   Procedure: PERCUTANEOUS CORONARY STENT INTERVENTION (PCI-S);  Surgeon: Sherren Mocha, MD;  Location: [facility] C[facility];  Service: Cardiovascular;  Laterality: N/A;    Current Medications: Current Meds  Medication Sig  . amLODipine (NORVASC) 5 MG tablet TAKE 1 TABLET(5 MG) BY MOUTH DAILY  . amoxicillin (AMOXIL) 500 MG capsule TAKE 4  CAPSULE BY MOUTH ONE HOURS PRIOR TO DENTAL PROCEDURES  . aspirin EC 81 MG tablet Take 1 tablet (81 mg total) by mouth daily. Start after lovenox injections are done after 2 weeks  . bimatoprost (LUMIGAN) 0.01 % SOLN Place 1 drop into both eyes at bedtime.   . Calcium Carb-Cholecalciferol (CALCIUM 500 +D) 500-400 MG-UNIT TABS Take 1 tablet by mouth daily.   [name] ezetimibe (ZETIA) 10 MG tablet Take 1 tablet (10 mg total) by mouth daily.  . furosemide (LASIX) 20 MG tablet Take 1 tablet (20 mg total) by mouth daily.  . isosorbide mononitrate (IMDUR) 60 MG 24 hr tablet Take 1 tablet (60 mg total) by mouth daily.  . Multiple Vitamin (MULTIVITAMIN) tablet Take 1 tablet by mouth  daily.    . nitroGLYCERIN (NITROSTAT) 0.4 MG SL tablet Place 1 tablet (0.4 mg total) under the tongue every 5 (five) minutes as needed for chest pain (chest pressure and tightness).     Allergies:   Statins   Social History   Social History  . Marital status: Widowed    Spouse name: N/A  . Number of children: N/A  . Years of education: N/A   Social History Main Topics  . Smoking status: Passive Smoke Exposure - Never Smoker  . Smokeless tobacco: Never Used  . Alcohol use 0.0 oz/week     Comment: very seldom  . Drug use: No  . Sexual activity: Not Currently   Other Topics Concern  . 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   ROS:   Please see the history of present illness.    ROS All other systems reviewed and are negative.   EKGs/Labs/Other Test Reviewed:    EKG:  EKG is not ordered today.  The ekg ordered today demonstrates n/a  Recent Labs: 08/17/2015: BUN 37; Creat 1.56; Potassium 4.0; Sodium 138   Recent Lipid Panel    Component Value Date/Time   CHOL 178 01/04/2015 0853   TRIG 138.0 01/04/2015 0853   HDL 42.70 01/04/2015 0853   CHOLHDL 4 01/04/2015 0853   VLDL 27.6 01/04/2015 0853   LDLCALC 107 (H) 01/04/2015 0853   LDLDIRECT 158.6 10/23/2012 1223     Physical Exam:    VS:  BP (!) 138/42 (BP Location: Right Arm, Patient Position: Sitting, Cuff Size: Large)   Pulse 78   Ht 5' (1.524 m)   Wt 171 lb 6.4 oz (77.7 kg)   SpO2 98%   BMI 33.47 kg/m     Wt Readings from Last 3 Encounters:  04/30/16 171 lb 6.4 oz (77.7 kg)  01/17/16 168 lb (76.2 kg)  12/19/15 166 lb 14.4 oz (75.7 kg)     Physical Exam  Constitutional: She is oriented to person, place, and time. She appears well-developed and well-nourished. No distress.    HENT:  Head: Normocephalic and atraumatic.  Eyes: No scleral icterus.  Neck: No JVD present.  Cardiovascular: Normal rate and regular rhythm.   Murmur heard.  Systolic murmur is present with a grade of 2/6  at the upper right sternal border Pulmonary/Chest: Effort normal. She has no wheezes. She has no rales.  Abdominal: Soft. There is no tenderness.  Musculoskeletal: She exhibits edema.  Trace-1+ bilateral ankle edema.  Neurological: She is alert and oriented to person, place, and time.  Skin: Skin is warm and dry.  Psychiatric: She has a normal mood and affect.    ASSESSMENT:    1. Coronary artery disease involving native coronary artery of native heart with angina pectoris (Sara Garza)   2. Chronic diastolic CHF (congestive heart failure) (Sara Garza)   3. Valvular heart disease   4. Essential hypertension   5. Pain in both lower extremities    PLAN:    In order of problems listed above:  1. CAD - She continues to have exertional angina (CCS class 2-3).  She is not interested in further testing and prefers to avoid cardiac cath given her advanced age.  I will try to advance her medical Rx further.  -  Continue Amlodipine, Imdur  -  Add Metoprolol Succinate 25 mg QD  -  FU in April as planned or sooner if symptoms progress   2. Chronic diastolic CHF - NYHA 2b-3.  Volume appears stable.  She avoids taking Lasix every day.  I encouraged her to take it as much as possible.    3. Valvular heart disease - s/p [name] with mild to mod AI.  She has severe MS and mild MR as well.  She is due for follow up echocardiogram in April.  FU with Dr. Sherren Mocha after her echocardiogram.  4. HTN - BP is controlled.   5. Leg pain - She has symptoms concerning for claudication.  Will get ABIs.   Medication Adjustments/Labs and Tests Ordered: Current medicines are reviewed at length with the patient today.  Concerns regarding medicines are outlined above.  Medication changes, Labs and Tests ordered  today are outlined in the Patient Instructions noted below. Patient Instructions  Medication Instructions:  1. START TOPROL XL 25 MG DAILY; RX HAS BEEN SENT IN  Labwork: NONE  Testing/Procedures: 1. Your physician has requested that you have an echocardiogram. Echocardiography is a painless test that uses sound waves to create images of your heart. It provides your doctor with information about the size and shape of your heart and how well your heart's chambers and valves are working. This procedure takes approximately one hour. There are no restrictions for this procedure. DR. Burt Knack HAS ORDER IN COMPUTER ALREADY  2. Your physician has requested that you have a lower extremity arterial exercise duplex, THIS IS TO BE DONE ABI's . During this test, exercise and ultrasound are used to evaluate arterial blood flow in the legs. Allow one hour for this exam. There are no restrictions or special instructions.  Follow-Up: DR. Burt Knack IN April 2018  Any Other Special Instructions Will Be Listed Below (If Applicable).  If you need a refill on your cardiac medications before your next appointment, please call your pharmacy.  Signed, Richardson Dopp, PA-C  04/30/2016 1:31 PM    [facility] [address], [place], [place]  [address] Phone: [phone]; Fax: [phone]

## 2016-04-30 NOTE — Patient Instructions (Addendum)
Medication Instructions:  1. START TOPROL XL 25 MG DAILY; RX HAS BEEN SENT IN  Labwork: NONE  Testing/Procedures: 1. Your physician has requested that you have an echocardiogram. Echocardiography is a painless test that uses sound waves to create images of your heart. It provides your doctor with information about the size and shape of your heart and how well your heart's chambers and valves are working. This procedure takes approximately one hour. There are no restrictions for this procedure. DR. Burt Knack HAS ORDER IN COMPUTER ALREADY  2. Your physician has requested that you have a lower extremity arterial exercise duplex, THIS IS TO BE DONE ABI's . During this test, exercise and ultrasound are used to evaluate arterial blood flow in the legs. Allow one hour for this exam. There are no restrictions or special instructions.  Follow-Up: DR. Burt Knack IN April 2018  Any Other Special Instructions Will Be Listed Below (If Applicable).  If you need a refill on your cardiac medications before your next appointment, please call your pharmacy.

## 2016-05-08 ENCOUNTER — Encounter: Payer: Self-pay | Admitting: Adult Health

## 2016-05-08 ENCOUNTER — Ambulatory Visit (INDEPENDENT_AMBULATORY_CARE_PROVIDER_SITE_OTHER): Payer: Medicare Other | Admitting: Adult Health

## 2016-05-08 VITALS — BP 136/68 | Ht 60.0 in | Wt 167.4 lb

## 2016-05-08 DIAGNOSIS — I208 Other forms of angina pectoris: Secondary | ICD-10-CM

## 2016-05-08 DIAGNOSIS — M546 Pain in thoracic spine: Secondary | ICD-10-CM | POA: Diagnosis not present

## 2016-05-08 LAB — POCT URINALYSIS DIPSTICK
BILIRUBIN UA: NEGATIVE
GLUCOSE UA: NEGATIVE
Leukocytes, UA: NEGATIVE
Nitrite, UA: NEGATIVE
PH UA: 6
RBC UA: NEGATIVE
Spec Grav, UA: 1.025
Urobilinogen, UA: NEGATIVE

## 2016-05-08 NOTE — Progress Notes (Signed)
Subjective:    Patient ID: Sara Garza, female    DOB: March 25, 1924, 83 y.o.   MRN: [id number]  HPI  83 year old active female who  has a past medical history of Arthritis; CAD (coronary artery disease); Carotid stenosis, left; Diabetes mellitus; Glaucoma; Hyperlipidemia; Hypertension; and Osteoporosis. She presents to the office today for concern of new blood pressure medication. He was seen by cardiology on 04/30/2016 at which time she continued to complain of exertional angina. She was not interested in further testing and wanted to avoid cardiac catheterization. From cardiology notes it appears that they added metoprolol succinate 25 mg daily and she is to continue amlodipine and Imdur for exertional angina. At that time her blood pressure was 138/46. Today in the office her blood pressure is 136/68. He has not started the metoprolol at this time as she has concerns that it might drop her blood pressure too low.  She does report episodes of angina with exertion but states "it's nothing that I can't handle".  She is also concerned for back pain and feels as though it's either a urinary tract infection or a muscle strain from getting back into a regular exercise regimen. Back pain is located on the right middle side and has been present for approximately 2-3 days. Pain is described as sharp. She does endorse urinary frequency and occasional incontinence. Denies any hematuria or dysuria  Review of Systems  Constitutional: Negative.   Respiratory: Negative.   Cardiovascular: Negative.   Gastrointestinal: Negative.   Genitourinary: Positive for frequency and urgency. Negative for dysuria, flank pain, hematuria and pelvic pain.  Musculoskeletal: Positive for arthralgias, back pain and gait problem. Negative for joint swelling.  Skin: Negative.   All other systems reviewed and are negative.  Past Medical History:  Diagnosis Date  . Arthritis   . CAD (coronary artery disease)   . Carotid  stenosis, left    50-60% stable  . Diabetes mellitus   . Glaucoma   . Hyperlipidemia   . Hypertension   . Osteoporosis     Social History   Social History  . Marital status: Widowed    Spouse name: N/A  . Number of children: N/A  . Years of education: N/A   Occupational History  . Not on file.   Social History Main Topics  . Smoking status: Passive Smoke Exposure - Never Smoker  . Smokeless tobacco: Never Used  . Alcohol use 0.0 oz/week     Comment: very seldom  . Drug use: No  . Sexual activity: Not Currently   Other Topics Concern  . 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 ANOMALOUS PULMONARY VENOUS RETURN REPAIR, TOTAL    . APPENDECTOMY    . CARDIAC VALVE REPLACEMENT  04/29/2012  . Cataracts Bilateral   . CESAREAN SECTION    . FOOT SURGERY     [name]  . HIP ARTHROPLASTY Right 07/31/2013   Procedure: ARTHROPLASTY BIPOLAR HIP;  Surgeon: [name], MD;  Location: [facility];  Service: Orthopedics;  Laterality: Right;  . PERCUTANEOUS CORONARY STENT INTERVENTION (PCI-S) N/A 01/02/2012   Procedure: PERCUTANEOUS CORONARY STENT INTERVENTION (PCI-S);  Surgeon: Sherren Mocha, MD;  Location: [facility] CATH LAB;  Service: Cardiovascular;  Laterality: N/A;    Family History  Problem Relation Age of Onset  . COPD Father   . Cancer Father     lung cancer  . Lung cancer      Allergies  Allergen Reactions  . Statins Other (See Comments)    Muscle aches    Current Outpatient Prescriptions on File Prior to Visit  Medication Sig Dispense Refill  . amLODipine (NORVASC) 5 MG tablet TAKE 1 TABLET(5 MG) BY MOUTH DAILY 90 tablet 1  . amoxicillin (AMOXIL) 500 MG capsule TAKE 4 CAPSULE BY MOUTH ONE HOURS PRIOR TO DENTAL PROCEDURES 8 capsule 1  . aspirin EC 81 MG tablet Take 1 tablet (81  mg total) by mouth daily. Start after lovenox injections are done after 2 weeks    . bimatoprost (LUMIGAN) 0.01 % SOLN Place 1 drop into both eyes at bedtime.     . Calcium Carb-Cholecalciferol (CALCIUM 500 +D) 500-400 MG-UNIT TABS Take 1 tablet by mouth daily.     [name] ezetimibe (ZETIA) 10 MG tablet Take 1 tablet (10 mg total) by mouth daily. 30 tablet 11  . furosemide (LASIX) 20 MG tablet Take 1 tablet (20 mg total) by mouth daily. 90 tablet 3  . isosorbide mononitrate (IMDUR) 60 MG 24 hr tablet Take 1 tablet (60 mg total) by mouth daily. 30 tablet 11  . metoprolol succinate (TOPROL-XL) 25 MG 24 hr tablet Take 1 tablet (25 mg total) by mouth daily. 90 tablet 3  . Multiple Vitamin (MULTIVITAMIN) tablet Take 1 tablet by mouth daily.      . nitroGLYCERIN (NITROSTAT) 0.4 MG SL tablet Place 1 tablet (0.4 mg total) under the tongue every 5 (five) minutes as needed for chest pain (chest pressure and tightness). 25 tablet 1   No current facility-administered medications on file prior to visit.     BP 136/68   Ht 5' (1.524 m)   Wt 167 lb 6.4 oz (75.9 kg)   BMI 32.69 kg/m       Objective:   Physical Exam  Constitutional: She is oriented to person, place, and time. She appears well-developed and well-nourished. No distress.  Cardiovascular: Normal rate, regular rhythm, normal heart sounds and intact distal pulses.  Exam reveals no gallop and no friction rub.   No murmur heard. Pulmonary/Chest: Effort normal and breath sounds normal. No respiratory distress. She has no wheezes. She has no rales. She exhibits no tenderness.  Musculoskeletal: Normal range of motion. She exhibits tenderness (Able to repeat produce pain with palpation to right mid back area and there is no bruising, edema, or crepitus). She exhibits no edema or deformity.  Neurological: She is alert and oriented to person, place, and time.  Skin: Skin is warm and dry. No rash noted. She is not diaphoretic. No erythema. No pallor.    Psychiatric: She has a normal mood and affect. Her behavior is normal. Judgment and thought content normal.  Nursing note and vitals reviewed.     Assessment & Plan:  1. Exertional angina (HCC) - I am okay with her holding off on starting metoprolol 25 mg daily. Einar Pheasant concern that her blood pressure might drop to low causing her syncopal episodes, dizziness, or lightheadedness. I advised her to follow up with cardiology and make sure this plan of care is okay. She will follow-up with them in April as directed  2. Acute right-sided thoracic back pain - POCT urinalysis dipstick- didn't for UTI - Years more muscular in nature, she has taken Tylenol  which started with the pain but does not resolve it. Advised her to use an icy hot patch heating pad to help with muscle pain. Stretching exercises would also be beneficial - Follow up as needed  [name], NP

## 2016-05-15 DIAGNOSIS — L570 Actinic keratosis: Secondary | ICD-10-CM | POA: Diagnosis not present

## 2016-05-16 ENCOUNTER — Other Ambulatory Visit: Payer: Self-pay | Admitting: Physician Assistant

## 2016-05-16 DIAGNOSIS — M79605 Pain in left leg: Principal | ICD-10-CM

## 2016-05-16 DIAGNOSIS — M79604 Pain in right leg: Secondary | ICD-10-CM

## 2016-05-17 ENCOUNTER — Ambulatory Visit (HOSPITAL_COMMUNITY)
Admission: RE | Admit: 2016-05-17 | Discharge: 2016-05-17 | Disposition: A | Discharge status: Home / Self Care | Payer: Medicare Other | Source: Ambulatory Visit | Attending: Cardiology | Admitting: Cardiology

## 2016-05-17 DIAGNOSIS — M79605 Pain in left leg: Secondary | ICD-10-CM | POA: Diagnosis not present

## 2016-05-17 DIAGNOSIS — M79604 Pain in right leg: Secondary | ICD-10-CM | POA: Insufficient documentation

## 2016-05-19 ENCOUNTER — Encounter: Payer: Self-pay | Admitting: Physician Assistant

## 2016-05-20 ENCOUNTER — Telehealth: Payer: Self-pay | Admitting: *Deleted

## 2016-05-20 NOTE — Telephone Encounter (Signed)
Pt notified of LEA with ABI are normal. Pt agreeable to continue current therapy.   I will fax a copy of this study result to her PCP: [name], NP. Pt verbalized understanding to plan of care and results given today.

## 2016-06-02 ENCOUNTER — Emergency Department (HOSPITAL_COMMUNITY)
Admission: EM | Admit: 2016-06-02 | Discharge: 2016-06-02 | Disposition: A | Discharge status: Home / Self Care | Payer: Medicare Other | Attending: Emergency Medicine | Admitting: Emergency Medicine

## 2016-06-02 ENCOUNTER — Encounter (HOSPITAL_COMMUNITY): Payer: Self-pay | Admitting: Emergency Medicine

## 2016-06-02 DIAGNOSIS — Z7722 Contact with and (suspected) exposure to environmental tobacco smoke (acute) (chronic): Secondary | ICD-10-CM | POA: Diagnosis not present

## 2016-06-02 DIAGNOSIS — Z7982 Long term (current) use of aspirin: Secondary | ICD-10-CM | POA: Diagnosis not present

## 2016-06-02 DIAGNOSIS — E1122 Type 2 diabetes mellitus with diabetic chronic kidney disease: Secondary | ICD-10-CM | POA: Insufficient documentation

## 2016-06-02 DIAGNOSIS — R05 Cough: Secondary | ICD-10-CM | POA: Insufficient documentation

## 2016-06-02 DIAGNOSIS — J810 Acute pulmonary edema: Secondary | ICD-10-CM | POA: Insufficient documentation

## 2016-06-02 DIAGNOSIS — J811 Chronic pulmonary edema: Secondary | ICD-10-CM | POA: Diagnosis not present

## 2016-06-02 DIAGNOSIS — Z79899 Other long term (current) drug therapy: Secondary | ICD-10-CM | POA: Insufficient documentation

## 2016-06-02 DIAGNOSIS — I129 Hypertensive chronic kidney disease with stage 1 through stage 4 chronic kidney disease, or unspecified chronic kidney disease: Secondary | ICD-10-CM | POA: Diagnosis not present

## 2016-06-02 DIAGNOSIS — N183 Chronic kidney disease, stage 3 (moderate): Secondary | ICD-10-CM | POA: Diagnosis not present

## 2016-06-02 DIAGNOSIS — Z955 Presence of coronary angioplasty implant and graft: Secondary | ICD-10-CM | POA: Insufficient documentation

## 2016-06-02 DIAGNOSIS — I2510 Atherosclerotic heart disease of native coronary artery without angina pectoris: Secondary | ICD-10-CM | POA: Insufficient documentation

## 2016-06-02 DIAGNOSIS — I509 Heart failure, unspecified: Secondary | ICD-10-CM | POA: Diagnosis not present

## 2016-06-02 DIAGNOSIS — I501 Left ventricular failure: Secondary | ICD-10-CM

## 2016-06-02 DIAGNOSIS — R0602 Shortness of breath: Secondary | ICD-10-CM | POA: Diagnosis present

## 2016-06-02 DIAGNOSIS — R059 Cough, unspecified: Secondary | ICD-10-CM

## 2016-06-02 LAB — CBC
HCT: 36.4 % (ref 36.0–46.0)
Hemoglobin: 12 g/dL (ref 12.0–15.0)
MCH: 30.1 pg (ref 26.0–34.0)
MCHC: 33 g/dL (ref 30.0–36.0)
MCV: 91.2 fL (ref 78.0–100.0)
PLATELETS: 218 10*3/uL (ref 150–400)
RBC: 3.99 MIL/uL (ref 3.87–5.11)
RDW: 13.2 % (ref 11.5–15.5)
WBC: 6.2 10*3/uL (ref 4.0–10.5)

## 2016-06-02 LAB — URINALYSIS, ROUTINE W REFLEX MICROSCOPIC
BILIRUBIN URINE: NEGATIVE
GLUCOSE, UA: NEGATIVE mg/dL
HGB URINE DIPSTICK: NEGATIVE
KETONES UR: 5 mg/dL — AB
Leukocytes, UA: NEGATIVE
Nitrite: NEGATIVE
PROTEIN: NEGATIVE mg/dL
Specific Gravity, Urine: 1.018 (ref 1.005–1.030)
pH: 5 (ref 5.0–8.0)

## 2016-06-02 LAB — BASIC METABOLIC PANEL
Anion gap: 10 (ref 5–15)
BUN: 33 mg/dL — ABNORMAL HIGH (ref 6–20)
CHLORIDE: 103 mmol/L (ref 101–111)
CO2: 24 mmol/L (ref 22–32)
CREATININE: 1.5 mg/dL — AB (ref 0.44–1.00)
Calcium: 9.6 mg/dL (ref 8.9–10.3)
GFR, EST AFRICAN AMERICAN: 34 mL/min — AB (ref >60)
GFR, EST NON AFRICAN AMERICAN: 29 mL/min — AB (ref >60)
Glucose, Bld: 89 mg/dL (ref 65–99)
POTASSIUM: 3.9 mmol/L (ref 3.5–5.1)
SODIUM: 137 mmol/L (ref 135–145)

## 2016-06-02 LAB — I-STAT TROPONIN, ED: Troponin i, poc: 0.02 ng/mL (ref 0.00–0.08)

## 2016-06-02 LAB — BRAIN NATRIURETIC PEPTIDE: B NATRIURETIC PEPTIDE 5: 276.6 pg/mL — AB (ref 0.0–100.0)

## 2016-06-02 MED ORDER — FUROSEMIDE 20 MG PO TABS
20.0000 mg | ORAL_TABLET | Freq: Once | ORAL | Status: AC
  Administered 2016-06-02: 20 mg via ORAL
  Filled 2016-06-02: qty 1

## 2016-06-02 MED ORDER — BENZONATATE 100 MG PO CAPS: 100.0000 mg | ORAL_CAPSULE | Freq: Three times a day (TID) | ORAL | 0 refills | Status: DC

## 2016-06-02 NOTE — ED Notes (Signed)
Per dr Lita Mains-- do not repeat cxr at the present.

## 2016-06-02 NOTE — ED Triage Notes (Signed)
Pt sent from urgent care on [address] for evaluation of CHF-- has xray CD-- with her -- pt has been coughing up greenish yellow thick sputum. Pt is in no resp distress.

## 2016-06-02 NOTE — ED Provider Notes (Signed)
[place] DEPT Provider Note   CSN: [id number] Arrival date & time: 06/02/16  1346     History   Chief Complaint Chief Complaint  Patient presents with  . Congestive Heart Failure  . Shortness of Breath    HPI Sara Garza is a 83 y.o. female.  HPI Pt sent from urgent care on [address] for evaluation of CHF-- has xray CD-- with her -- pt has been coughing up greenish yellow thick sputum. Pt is in no resp distress.  CXR at OSH: cardiomegaly, CHF with perihilar edema and distention of pulmonary venous markings. No PNA.  Hx aortic valve surgery 4 years ago Cardiologist: Dr. Burt Knack  History obtained from patient and her daughter, who is a Designer, jewellery. Patient states that since March 1, she developed a coarse cough with intermittent minimal blood streaks after coughing heavily. She was unsure if she could take MiraLAX but has been coughing extensively. She has green sputum produced but denies any chest pain or shortness of breath. She denies any fevers or chills. She went to urgent care where she had a chest x-ray to rule out pneumonia and was found to have pulmonary edema. She states that she stopped taking her Lasix on Wednesday as she was going to the bathroom constantly and did not want to deal with any more. She takes 10 g of Lasix every other day. She does have a history of aortic valve surgery but on yearly monitoring has had no trouble with her valve. She states and since discontinuing her Lasix she has had more swelling of her lower extremities but denies any orthopnea, dyspnea on exertion. She has any history of DVT or PE, no recent immobilization. She is not immunocompromised. She was sent here as the urgent care could not assess BNP.  Past Medical History:  Diagnosis Date  . Arthritis   . CAD (coronary artery disease)   . Carotid stenosis, left    50-60% stable  . Diabetes mellitus   . Glaucoma   . Hyperlipidemia   . Hypertension   . Leg pain    ABIs  2/18: normal bilaterally.  . Osteoporosis     Patient Active Problem List   Diagnosis Date Noted  . Neck mass 04/15/2015  . Fracture of hip, right, closed ([place]) 07/31/2013  . CAD (coronary artery disease) 07/31/2013  . CKD (chronic kidney disease), stage III 07/31/2013  . Closed right hip fracture ([place]) 07/31/2013  . Left bundle branch block 07/31/2013  . Severe calcific aortic stenosis 06/18/2010  . Diabetic polyneuropathy ([place]) 04/16/2010  . GLAUCOMA 01/06/2009  . CHEST PAIN, ATYPICAL 12/16/2008  . GANGLION CYST, JOINT 01/08/2008  . ROTATOR CUFF INJURY, RIGHT SHOULDER 11/25/2007  . SHOULDER PAIN, RIGHT 11/16/2007  . OSTEOARTHRITIS 02/02/2007  . Type 2 diabetes mellitus with renal manifestations, controlled ([place]) 10/24/2006  . Dyslipidemia 10/02/2006  . Essential hypertension 10/02/2006    Past Surgical History:  Procedure Laterality Date  . ANOMALOUS PULMONARY VENOUS RETURN REPAIR, TOTAL    . APPENDECTOMY    . CARDIAC VALVE REPLACEMENT  04/29/2012  . Cataracts Bilateral   . CESAREAN SECTION    . FOOT SURGERY     [name]  . HIP ARTHROPLASTY Right 07/31/2013   Procedure: ARTHROPLASTY BIPOLAR HIP;  Surgeon: [name], MD;  Location: [facility];  Service: Orthopedics;  Laterality: Right;  . PERCUTANEOUS CORONARY STENT INTERVENTION (PCI-S) N/A 01/02/2012   Procedure: PERCUTANEOUS CORONARY STENT INTERVENTION (PCI-S);  Surgeon: Sherren Mocha, MD;  Location: [facility]  CATH LAB;  Service: Cardiovascular;  Laterality: N/A;    OB History    No data available       Home Medications    Prior to Admission medications   Medication Sig Start Date End Date Taking? Authorizing Provider  amLODipine (NORVASC) 5 MG tablet TAKE 1 TABLET(5 MG) BY MOUTH DAILY 01/23/16   Marletta Lor, MD  amoxicillin (AMOXIL) 500 MG capsule TAKE 4 CAPSULE BY MOUTH ONE HOURS PRIOR TO DENTAL PROCEDURES 06/07/15   Sherren Mocha, MD  aspirin EC 81 MG tablet Take 1 tablet (81 mg total) by mouth daily. Start  after lovenox injections are done after 2 weeks 08/03/13   Ripudeep Krystal Eaton, MD  benzonatate (TESSALON) 100 MG capsule Take 1 capsule (100 mg total) by mouth every 8 (eight) hours. 06/02/16   Karma Greaser, MD  bimatoprost (LUMIGAN) 0.01 % SOLN Place 1 drop into both eyes at bedtime.     Historical Provider, MD  Calcium Carb-Cholecalciferol (CALCIUM 500 +D) 500-400 MG-UNIT TABS Take 1 tablet by mouth daily.     Historical Provider, MD  ezetimibe (ZETIA) 10 MG tablet Take 1 tablet (10 mg total) by mouth daily. 01/17/16   Sherren Mocha, MD  furosemide (LASIX) 20 MG tablet Take 1 tablet (20 mg total) by mouth daily. 06/30/15   Sherren Mocha, MD  isosorbide mononitrate (IMDUR) 60 MG 24 hr tablet Take 1 tablet (60 mg total) by mouth daily. 01/17/16   Sherren Mocha, MD  metoprolol succinate (TOPROL-XL) 25 MG 24 hr tablet Take 1 tablet (25 mg total) by mouth daily. 04/30/16   Liliane Shi, PA-C  Multiple Vitamin (MULTIVITAMIN) tablet Take 1 tablet by mouth daily.      Historical Provider, MD  nitroGLYCERIN (NITROSTAT) 0.4 MG SL tablet Place 1 tablet (0.4 mg total) under the tongue every 5 (five) minutes as needed for chest pain (chest pressure and tightness). 05/03/14   Erlene Quan, PA-C    Family History Family History  Problem Relation Age of Onset  . COPD Father   . Cancer Father     lung cancer  . Lung cancer      Social History Social History  Substance Use Topics  . Smoking status: Passive Smoke Exposure - Never Smoker  . Smokeless tobacco: Never Used  . Alcohol use 0.0 oz/week     Comment: very seldom     Allergies   Statins   Review of Systems Review of Systems  Constitutional: Negative for fever.  Allergic/Immunologic: Negative for immunocompromised state.  All other systems reviewed and are negative.   Physical Exam Updated Vital Signs BP 145/64   Pulse 84   Temp 97.7 F (36.5 C) (Oral)   Resp 18   Ht 5' (1.524 m)   Wt 76.2 kg   SpO2 97%   BMI 32.81 kg/m    Physical Exam  Constitutional: She appears well-developed and well-nourished. No distress.  HENT:  Head: Normocephalic and atraumatic.  Eyes: Conjunctivae are normal.  Neck: Neck supple. No JVD present.  Cardiovascular: Normal rate and regular rhythm.   No murmur heard. Pulmonary/Chest: Effort normal and breath sounds normal. No respiratory distress. She has no wheezes. She has no rales. She exhibits no tenderness.  Abdominal: Soft. There is no tenderness.  Musculoskeletal: She exhibits edema (2+ bilateral pitting edema). She exhibits no tenderness.  Neurological: She is alert.  Skin: Skin is warm and dry.  Psychiatric: She has a normal mood and affect.  Nursing note and  vitals reviewed.  ED Treatments / Results  Labs (all labs ordered are listed, but only abnormal results are displayed) Labs Reviewed  BASIC METABOLIC PANEL - Abnormal; Notable for the following:       Result Value   BUN 33 (*)    Creatinine, Ser 1.50 (*)    GFR calc non Af Amer 29 (*)    GFR calc Af Amer 34 (*)    All other components within normal limits  BRAIN NATRIURETIC PEPTIDE - Abnormal; Notable for the following:    B Natriuretic Peptide 276.6 (*)    All other components within normal limits  URINALYSIS, ROUTINE W REFLEX MICROSCOPIC - Abnormal; Notable for the following:    Ketones, ur 5 (*)    All other components within normal limits  CBC  I-STAT TROPOININ, ED    EKG  EKG Interpretation  Date/Time:  Sunday June 02 2016 14:16:09 EST Ventricular Rate:  82 PR Interval:  198 QRS Duration: 142 QT Interval:  454 QTC Calculation: 530 R Axis:   -25 Text Interpretation:  Normal sinus rhythm Left bundle branch block Abnormal ECG No Sgarbossa criteria No significant change since Aug 01 2013 Confirmed by Ellender Hose MD, Lysbeth Galas [phone]) on 06/02/2016 2:43:07 PM       Radiology No results found.  Procedures Procedures (including critical care time)  Medications Ordered in ED Medications  furosemide  (LASIX) tablet 20 mg (20 mg Oral Given 06/02/16 1824)     Initial Impression / Assessment and Plan / ED Course  I have reviewed the triage vital signs and the nursing notes.  Pertinent labs & imaging results that were available during my care of the patient were reviewed by me and considered in my medical decision making (see chart for details).     Patient has not been taking her Lasix due to morbidity associated with going to the bathroom frequently She now appears to have an episode of bronchitis given her yellow greenish sputum for the last 3 days but no fevers and CXR from OSH without evidence of PNA Patient's vital signs are reassuring, she is not symptomatic from the volume overload Doubt ACS, EKG reassuring and troponin is negative with no chest pain UA without UTI BNP slightly elevated Patient is appropriate for outpatient follow-up, discuss Lasix and increased dose today with one time dose here in the ER She will resume her home regimen and control her fluid as well as salt intake and elevate her extremities She'll follow-up with her primary care provider closely for reassessment of her symptoms Strict return precautions given; discharging good condition  Final Clinical Impressions(s) / ED Diagnoses   Final diagnoses:  Cough  Pulmonary edema cardiac cause [facility])    New Prescriptions Discharge Medication List as of 06/02/2016  7:18 PM    START taking these medications   Details  benzonatate (TESSALON) 100 MG capsule Take 1 capsule (100 mg total) by mouth every 8 (eight) hours., Starting Sun 06/02/2016, Print         Karma Greaser, MD 06/02/16 9983    Blanchie Dessert, MD 06/04/16 2017

## 2016-06-03 ENCOUNTER — Telehealth: Payer: Self-pay | Admitting: Cardiovascular Disease

## 2016-06-03 NOTE — Telephone Encounter (Signed)
I spoke with the pt and made her aware that I reviewed her ER notes.  The pt said she is totally at fault for symptoms since she stopped Furosemide and this episode has been a wake up call for her.  She is currently taking amoxicillin for bronchitis and she has resumed her Furosemide for CHF.  The pt states she will do her best to take Furosemide as prescribed. She is scheduled to see PCP tomorrow and has Cardiology follow-up in April. I will forward this information to Dr Burt Knack to make him aware that pt was in the ER.

## 2016-06-03 NOTE — Telephone Encounter (Signed)
New message    Pt is calling to let Dr. Burt Knack know she was in hospital on Sunday. Her Doctor told her to call and inform Dr. Burt Knack she has a diagnosis of CHF and bronchitis.

## 2016-06-04 ENCOUNTER — Ambulatory Visit (INDEPENDENT_AMBULATORY_CARE_PROVIDER_SITE_OTHER): Payer: Medicare Other | Admitting: Adult Health

## 2016-06-04 ENCOUNTER — Encounter: Payer: Self-pay | Admitting: Adult Health

## 2016-06-04 VITALS — BP 142/86 | Temp 98.4°F | Ht 60.0 in | Wt 164.6 lb

## 2016-06-04 DIAGNOSIS — J40 Bronchitis, not specified as acute or chronic: Secondary | ICD-10-CM

## 2016-06-04 DIAGNOSIS — I208 Other forms of angina pectoris: Secondary | ICD-10-CM

## 2016-06-04 MED ORDER — AZITHROMYCIN 250 MG PO TABS: ORAL_TABLET | ORAL | 0 refills | Status: DC

## 2016-06-04 MED ORDER — PREDNISONE 20 MG PO TABS: 20.0000 mg | ORAL_TABLET | Freq: Every day | ORAL | 0 refills | Status: DC

## 2016-06-04 NOTE — Progress Notes (Signed)
Subjective:    Patient ID: Sara Garza, female    DOB: [birth year], 83 y.o.   MRN: [id number]  HPI  83 year old female who presents to the office today after being seen in the ER. She was seen in the Er on 06/03/2015. She was sent from [facility] on [address] for evaluation of CHF. At that time she had been coughing up greenish yellow thick sputum.   CXR at OSH: cardiomegaly, CHF with perihilar edema and distention of pulmonary venous markings. No PNA.  At that time she had not been taking her Lasix as prescribed due to having to go to the bathroom all the time. Since that time she has started taking her lasix every other day.   She was diagnosed with bronchitis in the ER and was prescribed Tessalon pearls. She reports that the tessalon pearls are not helpful. She is also using Mucinex and Delsym without much resolution. She continues to have a cough but is no longer coughing up sputum. She denies any fevers or feeling ill. She also endorses feeling congestion and wheezing in her upper chest. She has been unable to sleep at night due to the cough   She denies any sinus pain or pressure  Review of Systems See HPI  Past Medical History:  Diagnosis Date  . Arthritis   . CAD (coronary artery disease)   . Carotid stenosis, left    50-60% stable  . Diabetes mellitus   . Glaucoma   . Hyperlipidemia   . Hypertension   . Leg pain    ABIs 2/18: normal bilaterally.  . Osteoporosis     Social History   Social History  . Marital status: Widowed    Spouse name: N/A  . Number of children: N/A  . Years of education: N/A   Occupational History  . Not on file.   Social History Main Topics  . Smoking status: Passive Smoke Exposure - Never Smoker  . Smokeless tobacco: Never Used  . Alcohol use 0.0 oz/week     Comment: very seldom  . Drug use: No  . Sexual activity: Not Currently   Other Topics Concern  . 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 ANOMALOUS PULMONARY VENOUS RETURN REPAIR, TOTAL    . APPENDECTOMY    . CARDIAC VALVE REPLACEMENT  04/29/2012  . Cataracts Bilateral   . CESAREAN SECTION    . FOOT SURGERY     [name]  . HIP ARTHROPLASTY Right 07/31/2013   Procedure: ARTHROPLASTY BIPOLAR HIP;  Surgeon: [name], MD;  Location: [facility];  Service: Orthopedics;  Laterality: Right;  . PERCUTANEOUS CORONARY STENT INTERVENTION (PCI-S) N/A 01/02/2012   Procedure: PERCUTANEOUS CORONARY STENT INTERVENTION (PCI-S);  Surgeon: Sherren Mocha, MD;  Location: [facility] CATH LAB;  Service: Cardiovascular;  Laterality: N/A;    Family History  Problem Relation Age of Onset  . COPD Father   . Cancer Father     lung cancer  . Lung cancer      Allergies  Allergen Reactions  . Statins Other (See Comments)    Muscle aches    Current Outpatient Prescriptions on File Prior to Visit  Medication Sig Dispense Refill  . amLODipine (  NORVASC) 5 MG tablet TAKE 1 TABLET(5 MG) BY MOUTH DAILY 90 tablet 1  . amoxicillin (AMOXIL) 500 MG capsule TAKE 4 CAPSULE BY MOUTH ONE HOURS PRIOR TO DENTAL PROCEDURES 8 capsule 1  . aspirin EC 81 MG tablet Take 1 tablet (81 mg total) by mouth daily. Start after lovenox injections are done after 2 weeks    . bimatoprost (LUMIGAN) 0.01 % SOLN Place 1 drop into both eyes at bedtime.     . Calcium Carb-Cholecalciferol (CALCIUM 500 +D) 500-400 MG-UNIT TABS Take 1 tablet by mouth daily.     [name] ezetimibe (ZETIA) 10 MG tablet Take 1 tablet (10 mg total) by mouth daily. 30 tablet 11  . furosemide (LASIX) 20 MG tablet Take 1 tablet (20 mg total) by mouth daily. 90 tablet 3  . isosorbide mononitrate (IMDUR) 60 MG 24 hr tablet Take 1 tablet (60 mg total) by mouth daily. 30 tablet 11  . metoprolol succinate (TOPROL-XL) 25 MG 24 hr tablet Take 1 tablet (25 mg total)  by mouth daily. 90 tablet 3  . Multiple Vitamin (MULTIVITAMIN) tablet Take 1 tablet by mouth daily.      . nitroGLYCERIN (NITROSTAT) 0.4 MG SL tablet Place 1 tablet (0.4 mg total) under the tongue every 5 (five) minutes as needed for chest pain (chest pressure and tightness). 25 tablet 1   No current facility-administered medications on file prior to visit.     BP (!) 142/86   Temp 98.4 F (36.9 C) (Oral)   Ht 5' (1.524 m)   Wt 164 lb 9.6 oz (74.7 kg)   BMI 32.15 kg/m       Objective:   Physical Exam  Constitutional: She appears well-developed and well-nourished. No distress.  Cardiovascular: Normal rate, regular rhythm, normal heart sounds and intact distal pulses.  Exam reveals no gallop and no friction rub.   No murmur heard. Pulmonary/Chest: Effort normal. She has wheezes (trace throughout ).  Musculoskeletal: Normal range of motion. She exhibits no edema, tenderness or deformity.  Skin: Skin is warm and dry. No rash noted. She is not diaphoretic. No erythema. No pallor.  Psychiatric: She has a normal mood and affect. Her behavior is normal. Judgment and thought content normal.  Nursing note and vitals reviewed.     Assessment & Plan:  1. Bronchitis - No concern for pneumonia but with her comorbidities will treat with Z-pack and prednisone.  - predniSONE (DELTASONE) 20 MG tablet; Take 1 tablet (20 mg total) by mouth daily with breakfast.  Dispense: 5 tablet; Refill: 0 - azithromycin (ZITHROMAX Z-PAK) 250 MG tablet; Take 2 tablets on Day 1.  Then take 1 tablet daily.  Dispense: 6 tablet; Refill: 0 - Follow up if no improvement or if symptoms worsen   [name], NP

## 2016-06-06 NOTE — Telephone Encounter (Signed)
thx for letting me know 

## 2016-06-07 ENCOUNTER — Telehealth: Payer: Self-pay | Admitting: Adult Health

## 2016-06-07 ENCOUNTER — Other Ambulatory Visit: Payer: Self-pay

## 2016-06-07 ENCOUNTER — Other Ambulatory Visit: Payer: Self-pay | Admitting: Adult Health

## 2016-06-07 MED ORDER — GUAIFENESIN-CODEINE 100-10 MG/5ML PO SOLN: 5.0000 mL | Freq: Three times a day (TID) | ORAL | 0 refills | Status: DC | PRN

## 2016-06-07 NOTE — Telephone Encounter (Signed)
Did the prednisone and azithromycin help?

## 2016-06-07 NOTE — Telephone Encounter (Signed)
Pt was seen on 06-04-16 for bronchitis  and  Taking otc delsym. Pt would like something stronger for her cough sent to [facility]/ [address].

## 2016-06-07 NOTE — Telephone Encounter (Signed)
Please advise 

## 2016-06-07 NOTE — Telephone Encounter (Signed)
I contacted patient. Patient states that she feels the prednisone and the azithromycin did help overall, but she still has an awful, nagging cough that is keeping her up at night.

## 2016-06-11 ENCOUNTER — Other Ambulatory Visit: Payer: Medicare Other

## 2016-06-18 ENCOUNTER — Ambulatory Visit (INDEPENDENT_AMBULATORY_CARE_PROVIDER_SITE_OTHER): Payer: Medicare Other | Admitting: Adult Health

## 2016-06-18 ENCOUNTER — Encounter: Payer: Medicare Other | Admitting: Adult Health

## 2016-06-18 ENCOUNTER — Encounter: Payer: Self-pay | Admitting: Adult Health

## 2016-06-18 ENCOUNTER — Ambulatory Visit (INDEPENDENT_AMBULATORY_CARE_PROVIDER_SITE_OTHER)
Admission: RE | Admit: 2016-06-18 | Discharge: 2016-06-18 | Disposition: A | Discharge status: Home / Self Care | Payer: Medicare Other | Source: Ambulatory Visit | Attending: Adult Health | Admitting: Adult Health

## 2016-06-18 VITALS — BP 142/80 | HR 96 | Ht 60.0 in | Wt 161.6 lb

## 2016-06-18 DIAGNOSIS — I10 Essential (primary) hypertension: Secondary | ICD-10-CM

## 2016-06-18 DIAGNOSIS — R05 Cough: Secondary | ICD-10-CM

## 2016-06-18 DIAGNOSIS — I208 Other forms of angina pectoris: Secondary | ICD-10-CM

## 2016-06-18 DIAGNOSIS — R059 Cough, unspecified: Secondary | ICD-10-CM

## 2016-06-18 DIAGNOSIS — N183 Chronic kidney disease, stage 3 unspecified: Secondary | ICD-10-CM

## 2016-06-18 DIAGNOSIS — E1121 Type 2 diabetes mellitus with diabetic nephropathy: Secondary | ICD-10-CM

## 2016-06-18 DIAGNOSIS — E785 Hyperlipidemia, unspecified: Secondary | ICD-10-CM

## 2016-06-18 LAB — HEPATIC FUNCTION PANEL
ALT: 10 U/L (ref 0–35)
AST: 13 U/L (ref 0–37)
Albumin: 3.8 g/dL (ref 3.5–5.2)
Alkaline Phosphatase: 60 U/L (ref 39–117)
Bilirubin, Direct: 0.1 mg/dL (ref 0.0–0.3)
TOTAL PROTEIN: 7.3 g/dL (ref 6.0–8.3)
Total Bilirubin: 0.5 mg/dL (ref 0.2–1.2)

## 2016-06-18 LAB — CBC WITH DIFFERENTIAL/PLATELET
BASOS PCT: 0.8 % (ref 0.0–3.0)
Basophils Absolute: 0.1 10*3/uL (ref 0.0–0.1)
EOS PCT: 3 % (ref 0.0–5.0)
Eosinophils Absolute: 0.2 10*3/uL (ref 0.0–0.7)
HCT: 35.7 % — ABNORMAL LOW (ref 36.0–46.0)
Hemoglobin: 11.9 g/dL — ABNORMAL LOW (ref 12.0–15.0)
LYMPHS ABS: 1.5 10*3/uL (ref 0.7–4.0)
Lymphocytes Relative: 17.8 % (ref 12.0–46.0)
MCHC: 33.4 g/dL (ref 30.0–36.0)
MCV: 90.5 fl (ref 78.0–100.0)
MONOS PCT: 9.2 % (ref 3.0–12.0)
Monocytes Absolute: 0.8 10*3/uL (ref 0.1–1.0)
NEUTROS ABS: 5.7 10*3/uL (ref 1.4–7.7)
NEUTROS PCT: 69.2 % (ref 43.0–77.0)
Platelets: 218 10*3/uL (ref 150.0–400.0)
RBC: 3.94 Mil/uL (ref 3.87–5.11)
RDW: 13.4 % (ref 11.5–15.5)
WBC: 8.2 10*3/uL (ref 4.0–10.5)

## 2016-06-18 LAB — BASIC METABOLIC PANEL
BUN: 54 mg/dL — ABNORMAL HIGH (ref 6–23)
CHLORIDE: 104 meq/L (ref 96–112)
CO2: 28 meq/L (ref 19–32)
Calcium: 9.8 mg/dL (ref 8.4–10.5)
Creatinine, Ser: 1.71 mg/dL — ABNORMAL HIGH (ref 0.40–1.20)
GFR: 29.74 mL/min — AB (ref >60.00)
GLUCOSE: 102 mg/dL — AB (ref 70–99)
POTASSIUM: 4.8 meq/L (ref 3.5–5.1)
SODIUM: 141 meq/L (ref 135–145)

## 2016-06-18 LAB — LIPID PANEL
CHOLESTEROL: 233 mg/dL — AB (ref 0–200)
HDL: 45.1 mg/dL (ref >39.00)
LDL Cholesterol: 161 mg/dL — ABNORMAL HIGH (ref 0–99)
NonHDL: 188.19
TRIGLYCERIDES: 136 mg/dL (ref 0.0–149.0)
Total CHOL/HDL Ratio: 5
VLDL: 27.2 mg/dL (ref 0.0–40.0)

## 2016-06-18 LAB — TSH: TSH: 1.63 u[IU]/mL (ref 0.35–4.50)

## 2016-06-18 LAB — POC URINALSYSI DIPSTICK (AUTOMATED)
Bilirubin, UA: NEGATIVE
Blood, UA: NEGATIVE
Glucose, UA: NEGATIVE
Ketones, UA: NEGATIVE
LEUKOCYTES UA: NEGATIVE
Nitrite, UA: NEGATIVE
PROTEIN UA: NEGATIVE
Spec Grav, UA: 1.03 (ref 1.030–1.035)
UROBILINOGEN UA: 0.2 (ref ?–2.0)
pH, UA: 6 (ref 5.0–8.0)

## 2016-06-18 LAB — HEMOGLOBIN A1C: HEMOGLOBIN A1C: 6.6 % — AB (ref 4.6–6.5)

## 2016-06-18 NOTE — Patient Instructions (Signed)
As always, it was a pleasure seeing you !   I will follow up with you regarding your labs and chest x ray   Continue to to stay active and eat well.   Increase your fluid intake   Follow up as needed

## 2016-06-18 NOTE — Progress Notes (Signed)
Subjective:    Patient ID: Sara Garza, female    DOB: [birth year], 83 y.o.   MRN: [id number]  HPI  Patient presents for yearly follow up exam. She is a pleasant 83 year old female who  has a past medical history of Arthritis; CAD (coronary artery disease); Carotid stenosis, left; Diabetes mellitus; Glaucoma; Hyperlipidemia; Hypertension; Leg pain; and Osteoporosis.   All immunizations and health maintenance protocols were reviewed with the patient and needed orders were placed. She is up to date on all vaccinations   Appropriate screening laboratory values were ordered for the patient including screening of hyperlipidemia, renal function and hepatic function.  Medication reconciliation,  past medical history, social history, problem list and allergies were reviewed in detail with the patient  Goals were established with regard to weight loss, exercise, and  diet in compliance with medications  End of life planning was discussed. She has an advanced directive and living will  She is well controlled on amlodipine 5 mg, Imdur 60mg , and Toprol 25 mg for hypertension   She is now taking Lasix 20 mg daily and has noticed significant decrease in her lower extremity edema.   She takes Zetia and ASA for hyperlipidemia.   Diabetes is diet controlled   She is up to date on her dental and vision visits.   She sees Dr. Burt Knack with Cardiology and Dr. Allyson Sabal with Dermatology as directed. She will have a echo and follow up with Dr. Burt Knack in April 2018   She continues to have a semi productive cough. This originally started at the beginning of March where she was seen in the ER. There was no evidence of PNA from OSH. She was prescribed Tessalon Pearls.   She then followed up with me with continued cough. Was treated with prednisone 5 day taper and Z-pack, she reports that this helped but her cough has not resolved. She denies any fevers or feeling ill.    Review of Systems  Constitutional:  Negative.   HENT: Negative.   Eyes: Negative.   Respiratory: Positive for cough and chest tightness. Negative for shortness of breath and wheezing.   Cardiovascular: Positive for leg swelling.  Gastrointestinal: Negative.   Endocrine: Negative.   Genitourinary: Negative.   Musculoskeletal: Negative.   Skin: Negative.   Allergic/Immunologic: Negative.   Neurological: Negative.   Hematological: Negative.   Psychiatric/Behavioral: Negative.   All other systems reviewed and are negative.  Past Medical History:  Diagnosis Date  . Arthritis   . CAD (coronary artery disease)   . Carotid stenosis, left    50-60% stable  . Diabetes mellitus   . Glaucoma   . Hyperlipidemia   . Hypertension   . Leg pain    ABIs 2/18: normal bilaterally.  . Osteoporosis     Social History   Social History  . Marital status: Widowed    Spouse name: N/A  . Number of children: N/A  . Years of education: N/A   Occupational History  . Not on file.   Social History Main Topics  . Smoking status: Passive Smoke Exposure - Never Smoker  . Smokeless tobacco: Never Used  . Alcohol use 0.0 oz/week     Comment: very seldom  . Drug use: No  . Sexual activity: Not Currently   Other Topics Concern  . Not on file   Social History Narrative   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  She likes to read and she takes classes at [employer]    Past Surgical History:  Procedure Laterality Date  . ANOMALOUS PULMONARY VENOUS RETURN REPAIR, TOTAL    . APPENDECTOMY    . CARDIAC VALVE REPLACEMENT  04/29/2012  . Cataracts Bilateral   . CESAREAN SECTION    . FOOT SURGERY     [name]  . HIP ARTHROPLASTY Right 07/31/2013   Procedure: ARTHROPLASTY BIPOLAR HIP;  Surgeon: [name], MD;  Location: [facility];  Service: Orthopedics;  Laterality: Right;  . PERCUTANEOUS CORONARY STENT INTERVENTION (PCI-S) N/A 01/02/2012   Procedure: PERCUTANEOUS CORONARY STENT INTERVENTION  (PCI-S);  Surgeon: Sherren Mocha, MD;  Location: [facility] CATH LAB;  Service: Cardiovascular;  Laterality: N/A;    Family History  Problem Relation Age of Onset  . COPD Father   . Cancer Father     lung cancer  . Lung cancer      Allergies  Allergen Reactions  . Statins Other (See Comments)    Muscle aches    Current Outpatient Prescriptions on File Prior to Visit  Medication Sig Dispense Refill  . amLODipine (NORVASC) 5 MG tablet TAKE 1 TABLET(5 MG) BY MOUTH DAILY 90 tablet 1  . amoxicillin (AMOXIL) 500 MG capsule TAKE 4 CAPSULE BY MOUTH ONE HOURS PRIOR TO DENTAL PROCEDURES 8 capsule 1  . aspirin EC 81 MG tablet Take 1 tablet (81 mg total) by mouth daily. Start after lovenox injections are done after 2 weeks    . azithromycin (ZITHROMAX Z-PAK) 250 MG tablet Take 2 tablets on Day 1.  Then take 1 tablet daily. 6 tablet 0  . bimatoprost (LUMIGAN) 0.01 % SOLN Place 1 drop into both eyes at bedtime.     . Calcium Carb-Cholecalciferol (CALCIUM 500 +D) 500-400 MG-UNIT TABS Take 1 tablet by mouth daily.     [name] ezetimibe (ZETIA) 10 MG tablet Take 1 tablet (10 mg total) by mouth daily. 30 tablet 11  . furosemide (LASIX) 20 MG tablet Take 1 tablet (20 mg total) by mouth daily. 90 tablet 3  . guaiFENesin-codeine 100-10 MG/5ML syrup Take 5 mLs by mouth 3 (three) times daily as needed for cough. 120 mL 0  . isosorbide mononitrate (IMDUR) 60 MG 24 hr tablet Take 1 tablet (60 mg total) by mouth daily. 30 tablet 11  . metoprolol succinate (TOPROL-XL) 25 MG 24 hr tablet Take 1 tablet (25 mg total) by mouth daily. 90 tablet 3  . Multiple Vitamin (MULTIVITAMIN) tablet Take 1 tablet by mouth daily.      . nitroGLYCERIN (NITROSTAT) 0.4 MG SL tablet Place 1 tablet (0.4 mg total) under the tongue every 5 (five) minutes as needed for chest pain (chest pressure and tightness). 25 tablet 1  . predniSONE (DELTASONE) 20 MG tablet Take 1 tablet (20 mg total) by mouth daily with breakfast. 5 tablet 0   No current  facility-administered medications on file prior to visit.     BP (!) 142/80 (BP Location: Right Arm, Patient Position: Sitting, Cuff Size: Normal)   Pulse 96   Ht 5' (1.524 m)   Wt 161 lb 9.6 oz (73.3 kg)   SpO2 95%   BMI 31.56 kg/m       Objective:   Physical Exam  Constitutional: She is oriented to person, place, and time. She appears well-developed and well-nourished. No distress.  HENT:  Head: Normocephalic and atraumatic.  Right Ear: External ear normal.  Left Ear: External ear normal.  Nose: Nose normal.  Mouth/Throat:  Oropharynx is clear and moist. No oropharyngeal exudate.  Eyes: Conjunctivae and EOM are normal. Pupils are equal, round, and reactive to light. Right eye exhibits no discharge. Left eye exhibits no discharge. No scleral icterus.  Neck: Normal range of motion. Neck supple. No JVD present. No tracheal deviation present. No thyromegaly present.  Cardiovascular: Normal rate, regular rhythm, normal heart sounds and intact distal pulses.  Exam reveals no gallop and no friction rub.   No murmur heard. Pulmonary/Chest: Effort normal and breath sounds normal. No stridor. No respiratory distress. She has no wheezes. She has no rales. She exhibits no tenderness.  Abdominal: Soft. Bowel sounds are normal. She exhibits no distension and no mass. There is no tenderness. There is no rebound and no guarding.  Genitourinary:  Genitourinary Comments: deferred  Musculoskeletal: Normal range of motion. She exhibits edema (non pitting lower extremity edema noted. ). She exhibits no tenderness or deformity.  Lymphadenopathy:    She has no cervical adenopathy.  Neurological: She is alert and oriented to person, place, and time. No cranial nerve deficit. Coordination normal.  Skin: Skin is warm and dry. No rash noted. She is not diaphoretic. No erythema. No pallor.  Psychiatric: She has a normal mood and affect. Her behavior is normal. Judgment and thought content normal.  Nursing  note and vitals reviewed.     Assessment & Plan:  1. Essential hypertension - Well controlled. No change in medications - Basic metabolic panel - CBC with Differential/Platelet - Hepatic function panel - Lipid panel - TSH - POCT Urinalysis Dipstick (Automated) - Hemoglobin A1c  2. Controlled type 2 diabetes mellitus with diabetic nephropathy, without long-term current use of insulin (HCC)  - Basic metabolic panel - CBC with Differential/Platelet - Hepatic function panel - Lipid panel - TSH - POCT Urinalysis Dipstick (Automated) - Hemoglobin A1c - Microalbumin / creatinine urine ratio - Follow up in 6 months   3. CKD (chronic kidney disease), stage III  - Basic metabolic panel - POCT Urinalysis Dipstick (Automated)  4. Dyslipidemia  - Basic metabolic panel - CBC with Differential/Platelet - Hepatic function panel - Lipid panel - TSH - POCT Urinalysis Dipstick (Automated) - Hemoglobin A1c - Consider change in medication but doubtful do to age  67. Cough - Consider longer prednisone dose  - DG Chest 2 View; Future  [name], NP

## 2016-06-19 LAB — MICROALBUMIN / CREATININE URINE RATIO
CREATININE, U: 298 mg/dL
MICROALB/CREAT RATIO: 1 mg/g (ref 0.0–30.0)
Microalb, Ur: 3.1 mg/dL — ABNORMAL HIGH (ref 0.0–1.9)

## 2016-06-25 ENCOUNTER — Telehealth: Payer: Self-pay | Admitting: Adult Health

## 2016-06-25 NOTE — Telephone Encounter (Signed)
Lab results have been placed up front to be mailed.

## 2016-06-25 NOTE — Telephone Encounter (Signed)
Pt would like to have lab results from 06/18/16 mailed to her unable to see on Mychart.

## 2016-06-27 ENCOUNTER — Encounter: Payer: Self-pay | Admitting: Cardiovascular Disease

## 2016-07-05 ENCOUNTER — Ambulatory Visit (HOSPITAL_COMMUNITY): Payer: Medicare Other | Attending: Internal Medicine

## 2016-07-05 ENCOUNTER — Other Ambulatory Visit: Payer: Self-pay

## 2016-07-05 DIAGNOSIS — I313 Pericardial effusion (noninflammatory): Secondary | ICD-10-CM | POA: Diagnosis not present

## 2016-07-05 DIAGNOSIS — I083 Combined rheumatic disorders of mitral, aortic and tricuspid valves: Secondary | ICD-10-CM | POA: Diagnosis not present

## 2016-07-05 DIAGNOSIS — I359 Nonrheumatic aortic valve disorder, unspecified: Secondary | ICD-10-CM | POA: Diagnosis not present

## 2016-07-11 ENCOUNTER — Other Ambulatory Visit: Payer: Self-pay | Admitting: Cardiovascular Disease

## 2016-07-11 ENCOUNTER — Ambulatory Visit (INDEPENDENT_AMBULATORY_CARE_PROVIDER_SITE_OTHER): Payer: Medicare Other | Admitting: Cardiovascular Disease

## 2016-07-11 ENCOUNTER — Encounter: Payer: Self-pay | Admitting: Cardiovascular Disease

## 2016-07-11 VITALS — BP 122/62 | HR 92 | Ht 60.0 in | Wt 166.0 lb

## 2016-07-11 DIAGNOSIS — I350 Nonrheumatic aortic (valve) stenosis: Secondary | ICD-10-CM | POA: Diagnosis not present

## 2016-07-11 DIAGNOSIS — I208 Other forms of angina pectoris: Secondary | ICD-10-CM

## 2016-07-11 DIAGNOSIS — I5032 Chronic diastolic (congestive) heart failure: Secondary | ICD-10-CM | POA: Diagnosis not present

## 2016-07-11 DIAGNOSIS — I359 Nonrheumatic aortic valve disorder, unspecified: Secondary | ICD-10-CM | POA: Diagnosis not present

## 2016-07-11 DIAGNOSIS — I10 Essential (primary) hypertension: Secondary | ICD-10-CM

## 2016-07-11 MED ORDER — FUROSEMIDE 20 MG PO TABS: 10.0000 mg | ORAL_TABLET | Freq: Every day | ORAL | 3 refills | Status: DC

## 2016-07-11 MED ORDER — METOPROLOL SUCCINATE ER 25 MG PO TB24: 25.0000 mg | ORAL_TABLET | Freq: Every day | ORAL | 1 refills | Status: DC

## 2016-07-11 NOTE — Patient Instructions (Addendum)
Medication Instructions:  Your physician has recommended you make the following change in your medication:  1. CHANGE Furosemide dose to 10mg  daily 2. START Metoprolol Succinate as previously prescribed  Labwork: No new orders.   Testing/Procedures: No new orders.   Follow-Up: Your physician wants you to follow-up in: 6 MONTHS with Dr Burt Knack.  You will receive a reminder letter in the mail two months in advance. If you don't receive a letter, please call our office to schedule the follow-up appointment.   Any Other Special Instructions Will Be Listed Below (If Applicable).     If you need a refill on your cardiac medications before your next appointment, please call your pharmacy.

## 2016-07-11 NOTE — Progress Notes (Signed)
Cardiology Office Note Date:  07/11/2016   ID:  Sara Garza, DOB 01/15/1924, MRN [id number]  PCP:  [name], NP  Cardiologist:  Sherren Mocha, MD    Chief Complaint  Patient presents with  . Shortness of Breath   History of Present Illness: Sara Garza is a 83 y.o. female who presents for follow-up evaluation. She underwent for [name] for treatment of severe symptomatic aortic stenosis in 2014. She was treated with a 23 mm Medtronic Corevalve through the moderate risk SurTAVI aortic stenosis trial. She also is followed for moderate coronary artery disease which was evaluated with pressure wire analysis of the RCA and LCx demonstrating no hemodynamic evidence of flow-obstruction. 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   The patient is here with her daughter today. Her biggest complaint is around urinary incontinence. She's having a lot of problems with this, especially on the days she takes furosemide. She currently is taking furosemide 20 mg every other day. Her incontinence has significantly impacted her lifestyle and she doesn't go out much on days that she takes Lasix.  Chronic dyspnea with exertion is unchanged. Her anginal symptoms are improved since increasing isosorbide. She is not having much chest pain at the present time. She denies orthopnea or PND. She's had no leg swelling or heart palpitations.   Past Medical History:  Diagnosis Date  . Arthritis   . CAD (coronary artery disease)   . Carotid stenosis, left    50-60% stable  . Diabetes mellitus   . Glaucoma   . Hyperlipidemia   . Hypertension   . Leg pain    ABIs 2/18: normal bilaterally.  . Osteoporosis     Past Surgical History:  Procedure Laterality Date  . ANOMALOUS PULMONARY VENOUS RETURN REPAIR, TOTAL    . APPENDECTOMY    . CARDIAC VALVE REPLACEMENT  04/29/2012  . Cataracts Bilateral   .  CESAREAN SECTION    . FOOT SURGERY     [name]  . HIP ARTHROPLASTY Right 07/31/2013   Procedure: ARTHROPLASTY BIPOLAR HIP;  Surgeon: [name], MD;  Location: [facility];  Service: Orthopedics;  Laterality: Right;  . PERCUTANEOUS CORONARY STENT INTERVENTION (PCI-S) N/A 01/02/2012   Procedure: PERCUTANEOUS CORONARY STENT INTERVENTION (PCI-S);  Surgeon: Sherren Mocha, MD;  Location: [facility] CATH LAB;  Service: Cardiovascular;  Laterality: N/A;    Current Outpatient Prescriptions  Medication Sig Dispense Refill  . amLODipine (NORVASC) 5 MG tablet TAKE 1 TABLET(5 MG) BY MOUTH DAILY 90 tablet 1  . amoxicillin (AMOXIL) 500 MG capsule TAKE 4 CAPSULE BY MOUTH ONE HOURS PRIOR TO DENTAL PROCEDURES 8 capsule 1  . aspirin EC 81 MG tablet Take 1 tablet (81 mg total) by mouth daily. Start after lovenox injections are done after 2 weeks    . bimatoprost (LUMIGAN) 0.01 % SOLN Place 1 drop into both eyes at bedtime.     . Calcium Carb-Cholecalciferol (CALCIUM 500 +D) 500-400 MG-UNIT TABS Take 1 tablet by mouth daily.     [name] ezetimibe (ZETIA) 10 MG tablet Take 1 tablet (10 mg total) by mouth daily. 30 tablet 11  . furosemide (LASIX) 20 MG tablet Take 0.5 tablets (10 mg total) by mouth daily. 45 tablet 3  . isosorbide mononitrate (IMDUR) 60 MG 24 hr tablet Take 1 tablet (60 mg total) by mouth daily. 30 tablet 11  . metoprolol succinate (TOPROL-XL) 25 MG  24 hr tablet Take 1 tablet (25 mg total) by mouth daily. 90 tablet 3  . Multiple Vitamin (MULTIVITAMIN) tablet Take 1 tablet by mouth daily.      . nitroGLYCERIN (NITROSTAT) 0.4 MG SL tablet Place 1 tablet (0.4 mg total) under the tongue every 5 (five) minutes as needed for chest pain (chest pressure and tightness). 25 tablet 1   No current facility-administered medications for this visit.     Allergies:   Statins   Social History:  The patient  reports that she is a non-smoker but has been exposed to tobacco smoke. She has never used smokeless tobacco. She  reports that she drinks alcohol. She reports that she does not use drugs.   Family History:  The patient's  family history includes COPD in her father; Cancer in her father.    ROS:  Please see the history of present illness.  Otherwise, review of systems is positive for hearing loss, leg pain.  All other systems are reviewed and negative.    PHYSICAL EXAM: VS:  BP 122/62   Pulse 92   Ht 5' (1.524 m)   Wt 166 lb (75.3 kg)   SpO2 94%   BMI 32.42 kg/m  , BMI Body mass index is 32.42 kg/m. GEN: Well nourished, well developed, pleasant elderly woman in no acute distress  HEENT: normal  Neck: no JVD, no masses. No carotid bruits Cardiac: RRR with 2/6 systolic murmur at the RUSB        Respiratory:  clear to auscultation bilaterally, normal work of breathing GI: soft, nontender, nondistended, + BS MS: no deformity or atrophy  Ext: no pretibial edema, pedal pulses 2+= bilaterally Skin: warm and dry, no rash Neuro:  Strength and sensation are intact Psych: euthymic mood, full affect  EKG:  EKG is not ordered today.  Recent Labs: 06/02/2016: B Natriuretic Peptide 276.6 06/18/2016: ALT 10; BUN 54; Creatinine, Ser 1.71; Hemoglobin 11.9; Platelets 218.0; Potassium 4.8; Sodium 141; TSH 1.63   Lipid Panel     Component Value Date/Time   CHOL 233 (H) 06/18/2016 0945   TRIG 136.0 06/18/2016 0945   HDL 45.10 06/18/2016 0945   CHOLHDL 5 06/18/2016 0945   VLDL 27.2 06/18/2016 0945   LDLCALC 161 (H) 06/18/2016 0945   LDLDIRECT 158.6 10/23/2012 1223      Wt Readings from Last 3 Encounters:  07/11/16 166 lb (75.3 kg)  06/18/16 161 lb 9.6 oz (73.3 kg)  06/04/16 164 lb 9.6 oz (74.7 kg)     Cardiac Studies Reviewed: 2D Echo 07-05-2016: Study Conclusions  - Left ventricle: The cavity size was normal. Wall thickness was   increased in a pattern of moderate LVH. There was severe   concentric hypertrophy. Systolic function was vigorous. The   estimated ejection fraction was in the range of  65% to 70%. Wall   motion was normal; there were no regional wall motion   abnormalities. The study is not technically sufficient to allow   evaluation of LV diastolic function. - Aortic valve: [name] valve in place. No obstruction. Trivial to   mild paravalvular leak. Mean gradient (S): 13 mm Hg. Peak   gradient (S): 26 mm Hg. Valve area (VTI): 1.42 cm^2. Valve area   (Vmax): 1.49 cm^2. Valve area (Vmean): 1.62 cm^2. - Mitral valve: Heavy MAC. Mild to moderate stenosis. There was   mild regurgitation. Valve area by pressure half-time: 2.44 cm^2.   Valve area by continuity equation (using LVOT flow): 1.51 cm^2. - Left  atrium: Severely dilated. - Tricuspid valve: There was trivial regurgitation. - Inferior vena cava: The vessel was normal in size. The   respirophasic diameter changes were in the normal range (= 50%),   consistent with normal central venous pressure. - Pericardium, extracardiac: A trivial pericardial effusion was   identified.  Impressions:  - Compared to a prior study in 05/2015, the [name] valve appears to be   functioning normally. I do not appreciate any significant AI.   There is a trivial to mild paravalvular leak. There is heavy MAC   with mild to moderate MS - perhaps the acceleration jet from MS   was mistaken to be an AI jet.   ASSESSMENT AND PLAN: 1.  Chronic diastolic heart failure: NYHA II. Advised to take lasix regularly, otherwise continue current medical therapy. Change metoprolol to 10 mg every day to see if this helps with her urinary incontinence. Encouraged her to seek formal urology consultation.  2. CAD With angina, CCS II: Isosorbide was increased at the time of the patient's last office visit in October 2017. She seems to have done well with this with improvement in her anginal symptoms. She has not been taking metoprolol and will start this. Her heart rate is relatively high.  3. Aortic valve disease s/p [name]: Recent echo is reviewed  today.Aortic insufficiency is minimal. I do not appreciate a diastolic heart murmur on her exam. Overall her valve function appears to be normal and left ventricular function is intact.  4. HTN: Start metoprolol. Change furosemide to 10 mg daily.  Current medicines are reviewed with the patient today.  The patient does not have concerns regarding medicines.  Labs/ tests ordered today include:  No orders of the defined types were placed in this encounter.   Disposition:   FU 6 months  Signed, Sherren Mocha, MD  07/11/2016 2:14 PM    [facility] [address], [place], [place]  [address] Phone: [phone]; Fax: [phone]

## 2016-07-20 ENCOUNTER — Other Ambulatory Visit: Payer: Self-pay | Admitting: Internal Medicine

## 2016-08-09 ENCOUNTER — Ambulatory Visit (INDEPENDENT_AMBULATORY_CARE_PROVIDER_SITE_OTHER): Payer: Medicare Other | Admitting: Adult Health

## 2016-08-09 ENCOUNTER — Encounter: Payer: Self-pay | Admitting: Adult Health

## 2016-08-09 VITALS — BP 150/72 | Ht 60.0 in | Wt 167.8 lb

## 2016-08-09 DIAGNOSIS — M791 Myalgia, unspecified site: Secondary | ICD-10-CM

## 2016-08-09 DIAGNOSIS — I208 Other forms of angina pectoris: Secondary | ICD-10-CM | POA: Diagnosis not present

## 2016-08-09 MED ORDER — METHYLPREDNISOLONE 4 MG PO TBPK: ORAL_TABLET | ORAL | 0 refills | Status: DC

## 2016-08-09 NOTE — Progress Notes (Signed)
Subjective:    Patient ID: Sara Garza, female    DOB: 1923/11/14, 83 y.o.   MRN: [id number]  HPI  83 year old female who  has a past medical history of Arthritis; CAD (coronary artery disease); Carotid stenosis, left; Diabetes mellitus; Glaucoma; Hyperlipidemia; Hypertension; Leg pain; and Osteoporosis. She presents to the office today for progressively worsening low back, groin, and thigh pain. Her symptoms have been present for 6 months. Pain is worse with movement and palpation. Pain is worse in the morning and becomes less painful throughout the afternoon. Tylenol seems to help  Pain is described as a " aching muscle pain"   She denies trauma, bruising, tea colored urine, fevers, or rashes.  Review of Systems See HPI   Past Medical History:  Diagnosis Date  . Arthritis   . CAD (coronary artery disease)   . Carotid stenosis, left    50-60% stable  . Diabetes mellitus   . Glaucoma   . Hyperlipidemia   . Hypertension   . Leg pain    ABIs 2/18: normal bilaterally.  . Osteoporosis     Social History   Social History  . Marital status: Widowed    Spouse name: N/A  . Number of children: N/A  . Years of education: N/A   Occupational History  . Not on file.   Social History Main Topics  . Smoking status: Passive Smoke Exposure - Never Smoker  . Smokeless tobacco: Never Used  . Alcohol use 0.0 oz/week     Comment: very seldom  . Drug use: No  . Sexual activity: Not Currently   Other Topics Concern  . 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 ANOMALOUS PULMONARY VENOUS RETURN REPAIR, TOTAL    . APPENDECTOMY    . CARDIAC VALVE REPLACEMENT  04/29/2012  . Cataracts Bilateral   . CESAREAN SECTION    . FOOT SURGERY     [name]  . HIP ARTHROPLASTY Right 07/31/2013   Procedure: ARTHROPLASTY BIPOLAR HIP;  Surgeon: [name], MD;  Location: [facility];  Service: Orthopedics;  Laterality: Right;  . PERCUTANEOUS CORONARY STENT INTERVENTION (PCI-S) N/A 01/02/2012   Procedure: PERCUTANEOUS CORONARY STENT INTERVENTION (PCI-S);  Surgeon: Sherren Mocha, MD;  Location: [facility] CATH LAB;  Service: Cardiovascular;  Laterality: N/A;    Family History  Problem Relation Age of Onset  . COPD Father   . Cancer Father        lung cancer  . Lung cancer Unknown     Allergies  Allergen Reactions  . Statins Other (See Comments)    Muscle aches    Current Outpatient Prescriptions on File Prior to Visit  Medication Sig Dispense Refill  . amLODipine (NORVASC) 5 MG tablet TAKE 1 TABLET(5 MG) BY MOUTH DAILY 90 tablet 0  . amoxicillin (AMOXIL) 500 MG capsule TAKE 4 CAPSULE BY MOUTH ONE HOURS PRIOR TO DENTAL PROCEDURES 8 capsule 1  . aspirin EC 81 MG tablet Take 1 tablet (81 mg total) by mouth daily. Start after lovenox injections are done after 2 weeks    . bimatoprost (LUMIGAN) 0.01 % SOLN Place 1 drop into both eyes at bedtime.     . Calcium Carb-Cholecalciferol (CALCIUM 500 +D)  500-400 MG-UNIT TABS Take 1 tablet by mouth daily.     [name] ezetimibe (ZETIA) 10 MG tablet Take 1 tablet (10 mg total) by mouth daily. 30 tablet 11  . furosemide (LASIX) 20 MG tablet Take 0.5 tablets (10 mg total) by mouth daily. 45 tablet 3  . isosorbide mononitrate (IMDUR) 60 MG 24 hr tablet Take 1 tablet (60 mg total) by mouth daily. 30 tablet 11  . metoprolol succinate (TOPROL-XL) 25 MG 24 hr tablet TAKE 1 TABLET(25 MG) BY MOUTH DAILY 90 tablet 3  . Multiple Vitamin (MULTIVITAMIN) tablet Take 1 tablet by mouth daily.      . nitroGLYCERIN (NITROSTAT) 0.4 MG SL tablet Place 1 tablet (0.4 mg total) under the tongue every 5 (five) minutes as needed for chest pain (chest pressure and tightness). 25 tablet 1   No current facility-administered medications on file prior to visit.     BP (!) 150/72 (BP  Location: Right Arm, Patient Position: Sitting, Cuff Size: Large)   Ht 5' (1.524 m)   Wt 167 lb 12.8 oz (76.1 kg)   BMI 32.77 kg/m       Objective:   Physical Exam  Constitutional: She is oriented to person, place, and time. She appears well-developed and well-nourished. No distress.  Cardiovascular: Normal rate, regular rhythm, normal heart sounds and intact distal pulses.  Exam reveals no gallop and no friction rub.   No murmur heard. Pulmonary/Chest: Effort normal and breath sounds normal. No respiratory distress. She has no wheezes. She has no rales. She exhibits no tenderness.  Musculoskeletal: She exhibits tenderness (low back, thighs, groin, and legs with palpation. She is able to get from chair to exam table with no difficulty ). She exhibits no edema or deformity.  Neurological: She is alert and oriented to person, place, and time. She has normal reflexes. She displays normal reflexes. No cranial nerve deficit. She exhibits normal muscle tone. Coordination normal.  Is walking with single prong cane  Skin: Skin is warm and dry. No rash noted. She is not diaphoretic. No erythema. No pallor.  Psychiatric: She has a normal mood and affect. Her behavior is normal. Judgment and thought content normal.  Nursing note and vitals reviewed.     Assessment & Plan:  1. Muscle pain - Unknown cause of myalgia at this point. She does not have any bruising or tick bites. Does not have any rashes. Possibly due to deconditioning.  - Ambulatory referral to Physical Therapy - Take Tylenol 1 gm Q8H x 3 days  - Warm compress - Follow up if no improvement   [name], NP

## 2016-08-12 ENCOUNTER — Encounter: Payer: Self-pay | Admitting: Sports Medicine

## 2016-08-12 ENCOUNTER — Ambulatory Visit (INDEPENDENT_AMBULATORY_CARE_PROVIDER_SITE_OTHER): Payer: Medicare Other

## 2016-08-12 ENCOUNTER — Ambulatory Visit (INDEPENDENT_AMBULATORY_CARE_PROVIDER_SITE_OTHER): Payer: Medicare Other | Admitting: Sports Medicine

## 2016-08-12 VITALS — BP 150/80 | HR 72 | Ht 60.0 in | Wt 167.2 lb

## 2016-08-12 DIAGNOSIS — G8929 Other chronic pain: Secondary | ICD-10-CM

## 2016-08-12 DIAGNOSIS — M545 Low back pain, unspecified: Secondary | ICD-10-CM | POA: Insufficient documentation

## 2016-08-12 DIAGNOSIS — I10 Essential (primary) hypertension: Secondary | ICD-10-CM

## 2016-08-12 DIAGNOSIS — M79604 Pain in right leg: Secondary | ICD-10-CM | POA: Diagnosis not present

## 2016-08-12 DIAGNOSIS — I208 Other forms of angina pectoris: Secondary | ICD-10-CM | POA: Diagnosis not present

## 2016-08-12 DIAGNOSIS — M47816 Spondylosis without myelopathy or radiculopathy, lumbar region: Secondary | ICD-10-CM | POA: Insufficient documentation

## 2016-08-12 DIAGNOSIS — M4726 Other spondylosis with radiculopathy, lumbar region: Secondary | ICD-10-CM

## 2016-08-12 DIAGNOSIS — I2510 Atherosclerotic heart disease of native coronary artery without angina pectoris: Secondary | ICD-10-CM

## 2016-08-12 DIAGNOSIS — M79605 Pain in left leg: Secondary | ICD-10-CM

## 2016-08-12 DIAGNOSIS — M791 Myalgia, unspecified site: Secondary | ICD-10-CM

## 2016-08-12 DIAGNOSIS — Z96649 Presence of unspecified artificial hip joint: Secondary | ICD-10-CM

## 2016-08-12 DIAGNOSIS — N183 Chronic kidney disease, stage 3 unspecified: Secondary | ICD-10-CM

## 2016-08-12 MED ORDER — CARVEDILOL 3.125 MG PO TABS: 3.1250 mg | ORAL_TABLET | Freq: Two times a day (BID) | ORAL | 3 refills | Status: DC

## 2016-08-12 MED ORDER — GABAPENTIN 100 MG PO CAPS: ORAL_CAPSULE | ORAL | 1 refills | Status: DC

## 2016-08-12 NOTE — Progress Notes (Signed)
OFFICE VISIT NOTE Sara Garza. Sara, Garza at [place] - 83 y.o. female MRN [id number]  Date of birth: [birth year]  Visit Date: 08/12/2016  PCP: Sara Peng, NP   Referred by: Sara Peng, NP  Sara Garza, CMA acting as scribe for Dr. Paulla Garza.  SUBJECTIVE:   Chief Complaint  Patient presents with  . pain in low back, groin, thigh   HPI: As below and per problem based documentation when appropriate.  Pt c/o low back, groin and thigh pain, right>left Sx started about 6 months ago No known injury. She is status post right unipolar prosthesis for hip fracture, performed by Dr. Alvan Dame.  The pain is described as aching, when walking sometimes the groin pain causes leg to give out and is rated as 4/5 now while being on Prednisone, pain about 8/10 before starting Prednisone.  Worsened with movement and palpation Improves with sitting, resting Therapies tried include Tylenol, Prednisone  Other associated symptoms include: pain causes legs to give out at times. She has tingling/numbness in both feet. Pt reports no pain in knees.  She has recently been changed on her blood pressure medication due to ongoing issues with furosemide which is causing frequent nocturia.  She was started on Metoprolol and dose correlate significant worsening in her leg symptoms since beginning this with diffuse muscle aches.    Review of Systems  Constitutional: Negative for chills and fever.  Respiratory: Negative for shortness of breath and wheezing.   Cardiovascular: Positive for leg swelling. Negative for chest pain.  Gastrointestinal: Negative for constipation and diarrhea.  Musculoskeletal: Positive for back pain and myalgias. Negative for joint pain.  Neurological: Positive for tingling. Negative for dizziness, tremors and headaches.  Endo/Heme/Allergies: Does not bruise/bleed easily.    Otherwise per HPI.  HISTORY &  PERTINENT PRIOR DATA:  No specialty comments available. She reports that she is a non-smoker but has been exposed to tobacco smoke. She has never used smokeless tobacco.   Recent Labs  12/19/15 1213 06/18/16 0945  HGBA1C 5.6 6.6*   Medications & Allergies reviewed per EMR Patient Active Problem List   Diagnosis Date Noted  . Chronic bilateral low back pain 08/12/2016  . Spondylosis of lumbar joint 08/12/2016  . Bilateral leg pain 08/12/2016  . Neck mass 04/15/2015  . Status post hip hemiarthroplasty 07/31/2013  . CAD (coronary artery disease) 07/31/2013  . CKD (chronic kidney disease), stage III 07/31/2013  . Left bundle branch block 07/31/2013  . Severe calcific aortic stenosis 06/18/2010  . Diabetic polyneuropathy ([place]) 04/16/2010  . GLAUCOMA 01/06/2009  . CHEST PAIN, ATYPICAL 12/16/2008  . SHOULDER PAIN, RIGHT 11/16/2007  . OSTEOARTHRITIS 02/02/2007  . Type 2 diabetes mellitus with renal manifestations, controlled ([place]) 10/24/2006  . Dyslipidemia 10/02/2006  . Essential hypertension 10/02/2006   Past Medical History:  Diagnosis Date  . Arthritis   . CAD (coronary artery disease)   . Carotid stenosis, left    50-60% stable  . Diabetes mellitus   . Glaucoma   . Hyperlipidemia   . Hypertension   . Leg pain    ABIs 2/18: normal bilaterally.  . Osteoporosis    Family History  Problem Relation Age of Onset  . COPD Father   . Cancer Father        lung cancer  . Lung cancer Unknown    Past Surgical History:  Procedure Laterality Date  . ANOMALOUS PULMONARY VENOUS RETURN REPAIR, TOTAL    .  APPENDECTOMY    . CARDIAC VALVE REPLACEMENT  04/29/2012  . Cataracts Bilateral   . CESAREAN SECTION    . FOOT SURGERY     [name]  . HIP ARTHROPLASTY Right 07/31/2013   Procedure: ARTHROPLASTY BIPOLAR HIP;  Surgeon: [name], MD;  Location: [facility];  Service: Orthopedics;  Laterality: Right;  . PERCUTANEOUS CORONARY STENT INTERVENTION (PCI-S) N/A 01/02/2012   Procedure:  PERCUTANEOUS CORONARY STENT INTERVENTION (PCI-S);  Surgeon: Sherren Mocha, MD;  Location: [facility] CATH LAB;  Service: Cardiovascular;  Laterality: N/A;   Social History   Occupational History  . Not on file.   Social History Main Topics  . Smoking status: Passive Smoke Exposure - Never Smoker  . Smokeless tobacco: Never Used  . Alcohol use 0.0 oz/week     Comment: very seldom  . Drug use: No  . Sexual activity: Not Currently    OBJECTIVE:  VS:  HT:5' (152.4 cm)   WT:167 lb 3.2 oz (75.8 kg)  BMI:32.7    BP:(!) 150/80  HR:72bpm  TEMP: ( )  RESP:94 % EXAM: Findings:  WDWN, NAD, Non-toxic appearing Alert & appropriately interactive Not depressed or anxious appearing No increased work of breathing. Pupils are equal. EOM intact without nystagmus No clubbing or cyanosis of the extremities appreciated No significant rashes/lesions/ulcerations overlying the examined area.  Back & Lower Extremities: No significant pretibial edema. DP & PT pulses 1+/4.  Capillary refill is less than 2 seconds. Sensation is diffusely diminished in bilateral lower extremities although equal comparing side to side. Pain with bilateral straight leg raise with tight hamstrings symmetrically. No focal bony tenderness however marked pain with palpation of bilateral paraspinal musculature and directly over the facets..   Pain with FADIR as well as logroll of the right hip. Dorsiflexion plantarflexion strength is diminished to 4 out of 5 bilaterally but is symmetric.   Lower extremity DTRs 1+/4 diffusely and symmetric.  Bilateral hips: Pain with terminal FADIR on the right. She has pain with palpation of bilateral legs in the bilateral quadriceps.     Dg Lumbar Spine 2-3 Views  Result Date: 08/12/2016 CLINICAL DATA:  Low back pain.  No fall.  No injury. EXAM: LUMBAR SPINE - 2-3 VIEW COMPARISON:  07/31/2013. FINDINGS: Lumbar spine scoliosis concave left with diffuse degenerative change . No acute bony  abnormality. 11 mm anterolisthesis L4 on L5. Stable calcific and hilar Aortoiliac atherosclerotic vascular disease. Right hip replacement. IMPRESSION: 1. Diffuse multilevel degenerative change. 2.  11 mm anterolisthesis L4 on L5. 3. Aortoiliac atherosclerotic vascular disease. Electronically Signed   By: Marcello Moores  Register   On: 08/12/2016 14:56   ASSESSMENT & PLAN:   Problem List Items Addressed This Visit    Essential hypertension (Chronic)    Recently she has had Toprol added to her regimen.  Surprisingly up-to-date does report that metoprolol does have an undisclosed frequency of musculoskeletal pain suspect this may be a contributing factor.  Given her blood pressure is still above goal I did take the liberty to have her discontinue metoprolol and began her on Coreg which does not have this associated side effects reported and may give some additional alpha blockade.  Medication compliance emphasized with patient today and she voices understanding and is agreeable to this plan.  I will plan to let Dr. Burt Knack know of this change and if any disagreement I will defer to his expertise but do think the significant onset of her symptoms seem to correlate strongly with starting his new medicine.  Relevant Medications   carvedilol (COREG) 3.125 MG tablet   Status post hip hemiarthroplasty    Is moving fairly well but she does have pain with terminal range of motion.  This could be related to worsening osteoarthritis that is appreciated on the lumbar spine films obtained today.  If any lack of improvement consider further dedicated films and consideration of conversion to THA although this would obviously like to be avoided.      CAD (coronary artery disease) (Chronic)   Relevant Medications   carvedilol (COREG) 3.125 MG tablet   CKD (chronic kidney disease), stage III (Chronic)    Cr was trending upward on last lab work.  If any nephrotoxic drugs avoid nephrotoxic drugs as well as consider recheck  of Cr.  Lab Results  Component Value Date   CREATININE 1.71 (H) 06/18/2016   CREATININE 1.50 (H) 06/02/2016   CREATININE 1.56 (H) 08/17/2015         Chronic bilateral low back pain - Primary   Relevant Orders   DG Lumbar Spine 2-3 Views (Completed)   Spondylosis of lumbar joint    Given significant discomfort the patient is in I do think that completion of her Medrol Dosepak as well as initiation of very low-dose gabapentin would be beneficial.  Should also follow-up with physical therapy as this would absolutely help with the underlying deconditioning that is likely exacerbating this.  Overall she is very mobile and independent woman and I would like for her to continue this as long as possible.  If any lack of improvement with conservative management as above can consider empiric ESI versus MRI of the lumbar spine.      Relevant Medications   gabapentin (NEURONTIN) 100 MG capsule   Bilateral leg pain    See notes above.  I Do think there is a possible metabolic myopathy component that may be contributing to some of the generalized muscle pain that she is having on top of the underlying radicular symptoms as well as osteoarthritis of the right hip that she has which is mild.      Relevant Medications   carvedilol (COREG) 3.125 MG tablet    Other Visit Diagnoses    Muscle pain          Follow-up: Return in about 6 weeks (around 09/23/2016).   CMA/ATC served as Education administrator during this visit. History, Physical, and Plan performed by medical provider. Documentation and orders reviewed and attested to.      Teresa Coombs, [place] [facility] Physician    08/12/2016 11:11 PM

## 2016-08-12 NOTE — Assessment & Plan Note (Signed)
Recently she has had Toprol added to her regimen.  Surprisingly up-to-date does report that metoprolol does have an undisclosed frequency of musculoskeletal pain suspect this may be a contributing factor.  Given her blood pressure is still above goal I did take the liberty to have her discontinue metoprolol and began her on Coreg which does not have this associated side effects reported and may give some additional alpha blockade.  Medication compliance emphasized with patient today and she voices understanding and is agreeable to this plan.  I will plan to let Dr. Burt Knack know of this change and if any disagreement I will defer to his expertise but do think the significant onset of her symptoms seem to correlate strongly with starting his new medicine.

## 2016-08-12 NOTE — Assessment & Plan Note (Signed)
Given significant discomfort the patient is in I do think that completion of her Medrol Dosepak as well as initiation of very low-dose gabapentin would be beneficial.  Should also follow-up with physical therapy as this would absolutely help with the underlying deconditioning that is likely exacerbating this.  Overall she is very mobile and independent woman and I would like for her to continue this as long as possible.  If any lack of improvement with conservative management as above can consider empiric ESI versus MRI of the lumbar spine.

## 2016-08-12 NOTE — Assessment & Plan Note (Signed)
Is moving fairly well but she does have pain with terminal range of motion.  This could be related to worsening osteoarthritis that is appreciated on the lumbar spine films obtained today.  If any lack of improvement consider further dedicated films and consideration of conversion to THA although this would obviously like to be avoided.

## 2016-08-12 NOTE — Assessment & Plan Note (Signed)
Cr was trending upward on last lab work.  If any nephrotoxic drugs avoid nephrotoxic drugs as well as consider recheck of Cr.  Lab Results  Component Value Date   CREATININE 1.71 (H) 06/18/2016   CREATININE 1.50 (H) 06/02/2016   CREATININE 1.56 (H) 08/17/2015

## 2016-08-12 NOTE — Assessment & Plan Note (Addendum)
See notes above.  I Do think there is a possible metabolic myopathy component that may be contributing to some of the generalized muscle pain that she is having on top of the underlying radicular symptoms as well as osteoarthritis of the right hip that she has which is mild.

## 2016-08-12 NOTE — Patient Instructions (Signed)
Please schedule follow-up with physical therapy for her low back. We have change 1 of her blood pressure medications as well started on a pill at night.  You can finish your steroid Dosepak, it should continue to help even after finishing it.

## 2016-08-20 DIAGNOSIS — N3946 Mixed incontinence: Secondary | ICD-10-CM | POA: Diagnosis not present

## 2016-08-22 ENCOUNTER — Ambulatory Visit (INDEPENDENT_AMBULATORY_CARE_PROVIDER_SITE_OTHER): Payer: Medicare Other | Admitting: Physical Therapy

## 2016-08-22 DIAGNOSIS — R2689 Other abnormalities of gait and mobility: Secondary | ICD-10-CM | POA: Diagnosis not present

## 2016-08-22 DIAGNOSIS — G8929 Other chronic pain: Secondary | ICD-10-CM

## 2016-08-22 DIAGNOSIS — R293 Abnormal posture: Secondary | ICD-10-CM | POA: Diagnosis not present

## 2016-08-22 DIAGNOSIS — M545 Low back pain: Secondary | ICD-10-CM

## 2016-08-22 DIAGNOSIS — M6281 Muscle weakness (generalized): Secondary | ICD-10-CM

## 2016-08-22 NOTE — Patient Instructions (Signed)
Lower Trunk Rotation Stretch    Keeping back flat and feet together, rotate knees to left side. Hold __30__ seconds. Repeat _2-3___ times on each side. Do _2-3___ sessions per day.  http://orth.exer.us/123   Copyright  VHI. All rights reserved.   Knee to Chest (Flexion)    Pull knee toward chest. Feel stretch in lower back or buttock area. Breathing deeply, Hold __30__ seconds. Repeat with other knee.  Use a towel around your leg so your arms don't have to do as much work. Repeat __2-3__ times on each side. Do __2-3__ sessions per day.  http://gt2.exer.us/226   Copyright  VHI. All rights reserved.                    Piriformis Stretch    Lying on back, pull right knee toward opposite shoulder. Hold __30__ seconds. Repeat __2-3__ times on each side. Do __2-3__ sessions per day.  http://gt2.exer.us/258   Copyright  VHI. All rights reserved.    Bridging    Slowly raise buttocks from bed or couch, and hold for 5 seconds. Repeat _10___ times per set. Do __1__ sets per session. Do ___2-3_ sessions per day.  http://orth.exer.us/1096   Copyright  VHI. All rights reserved.

## 2016-08-22 NOTE — Therapy (Signed)
[facility] [address] [place], [place], [address] Phone: [phone]   Fax:  [phone]  Physical Therapy Evaluation  Patient Details  Name: Sara Garza MRN: [id number] Date of Birth: October 20, 1923 Referring Provider: Dr. Legrand Como Rigby/Nafziger, [name], NP  Encounter Date: 08/22/2016      PT End of Session - 08/22/16 1528    Visit Number 1   Number of Visits 12   Date for PT Re-Evaluation 10/03/16   Authorization Type Medicare - G Codes and Progress Notes   PT Start Time 4742   PT Stop Time 1515   PT Time Calculation (min) 50 min   Activity Tolerance Patient tolerated treatment well   Behavior During Therapy [facility] for tasks assessed/performed      Past Medical History:  Diagnosis Date  . Arthritis   . CAD (coronary artery disease)   . Carotid stenosis, left    50-60% stable  . Diabetes mellitus   . Glaucoma   . Hyperlipidemia   . Hypertension   . Leg pain    ABIs 2/18: normal bilaterally.  . Osteoporosis     Past Surgical History:  Procedure Laterality Date  . ANOMALOUS PULMONARY VENOUS RETURN REPAIR, TOTAL    . APPENDECTOMY    . CARDIAC VALVE REPLACEMENT  04/29/2012  . Cataracts Bilateral   . CESAREAN SECTION    . FOOT SURGERY     [name]  . HIP ARTHROPLASTY Right 07/31/2013   Procedure: ARTHROPLASTY BIPOLAR HIP;  Surgeon: [name], MD;  Location: [facility];  Service: Orthopedics;  Laterality: Right;  . PERCUTANEOUS CORONARY STENT INTERVENTION (PCI-S) N/A 01/02/2012   Procedure: PERCUTANEOUS CORONARY STENT INTERVENTION (PCI-S);  Surgeon: Sherren Mocha, MD;  Location: [facility] CATH LAB;  Service: Cardiovascular;  Laterality: N/A;    There were no vitals filed for this visit.       Subjective Assessment - 08/22/16 1433    Subjective Pt is a 83 y/o female who presents to OPPT for bil lower extremity pain.  Pt reports back pain as well as hip, groin, and thigh pain.  Pt reports pain has been for at least 1 month, if  not longer.  Reports pain is mostly in ant thighs, followed by back, buttocks and groin.     Pertinent History Rt hip hemiarthroplasty 2015, OA, CAD, DM, HLD, HTN, osteoporosis   Limitations Walking   How long can you walk comfortably? < 5 min with cane   Patient Stated Goals be more comfortable and walk with decreased pain; fearful of falling and c/o LE weakness   Currently in Pain? Yes   Pain Score 5   up to 7/10   Pain Location Leg  back, hips, groin, buttocks   Pain Orientation Right;Left;Anterior;Lower;Medial   Pain Descriptors / Indicators Constant;Discomfort;Aching   Pain Type Chronic pain   Pain Onset More than a month ago   Pain Frequency Constant   Aggravating Factors  walking, standing   Pain Relieving Factors tylenol            [facility] PT Assessment - 08/22/16 1439      Assessment   Medical Diagnosis muscle pain, LBP   Referring Provider Dr. Legrand Como Rigby/Nafziger, [name], NP   Onset Date/Surgical Date --  1 month+   Next MD Visit 09/23/16   Prior Therapy few times in the past     Precautions   Precautions Fall     Restrictions   Weight Bearing Restrictions No     Balance  Screen   Has the patient fallen in the past 6 months No   Has the patient had a decrease in activity level because of a fear of falling?  Yes   Is the patient reluctant to leave their home because of a fear of falling?  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   Leisure reading, go out to [employer]   Overall Cognitive Status Within Functional Limits for tasks assessed     Posture/Postural Control   Posture/Postural Control Postural limitations   Postural  Limitations Rounded Shoulders;Forward head;Increased thoracic kyphosis     AROM   Overall AROM Comments lumbar flexion limited ~ 50% with increased pain returning to neutral; increased pain with lumbar extension, ROM WNL     Strength   Strength Assessment Site Hip;Knee   Right/Left Hip Right;Left   Right Hip Flexion 4/5   Right Hip Extension 3/5   Right Hip ABduction 3/5   Left Hip Flexion 4/5   Left Hip Extension 3/5   Left Hip ABduction 3/5   Right/Left Knee Right;Left   Right Knee Flexion 3/5   Right Knee Extension 4/5   Left Knee Flexion 3/5   Left Knee Extension 4/5     Flexibility   Soft Tissue Assessment /Muscle Length yes   Hamstrings tightness R>L   Quadriceps tightness R >L   Piriformis tightness     Palpation   SI assessment  no significant deviations noted; Rt forward motion with standing flexion test   Palpation comment tenderness R > L glut med, hamstrings, bil PSIS and ASIS     Special Tests    Special Tests Sacrolliac Tests   Sacroiliac Tests  Sacral Thrust     Sacral thrust    Findings Positive     Transfers   Transfers Sit to Stand   Sit to Stand 6: Modified independent (Device/Increase time);With upper extremity assist;Multiple attempts   Comments heavy reliance on UEs, unable to perform without UE support     Ambulation/Gait   Gait Pattern Trendelenburg;Lateral hip instability;Narrow base of support  crossover steps due to hip weakness                   [facility] Adult PT Treatment/Exercise - 08/22/16 1439      Exercises   Exercises Lumbar     Lumbar Exercises: Stretches   Single Knee to Chest Stretch 1 rep;30 seconds   Single Knee to Chest Stretch Limitations bil; for HEP instruction   Lower Trunk Rotation 1 rep;30 seconds   Lower Trunk Rotation Limitations bil; for HEP instruction   Piriformis Stretch 1 rep;30 seconds   Piriformis Stretch Limitations bil; for HEP instruction     Lumbar Exercises: Supine   Bridge 5 reps;5  seconds   Bridge Limitations for HEP instruction                PT Education - 08/22/16 1527    Education provided Yes   Education Details clinical findings, POC, HEP   Person(s) Educated Patient   Methods Explanation;Demonstration;Handout   Comprehension Verbalized understanding;Need further instruction;Returned demonstration  PT Long Term Goals - 2016/08/23 1532      PT LONG TERM GOAL #1   Title independent with HEP (10/03/16)   Time 6   Period Weeks   Status New     PT LONG TERM GOAL #2   Title report ability to walk for > 10 min without increase in pain for improved functional mobility (10/03/16)   Time 6   Period Weeks   Status New     PT LONG TERM GOAL #3   Title demonstrate 4/5 bil hip strength for improved functional mobility (10/03/16)   Time 6   Period Weeks   Status New     PT LONG TERM GOAL #4   Title perform sit to/from stand without UE support at least 5 times for improved functional strength (10/03/16)   Time 6   Period Weeks   Status New     PT LONG TERM GOAL #5   Title formal balance goals to follow if indicated    Time 6   Period Weeks   Status New               Plan - 2016-08-23 1528    Clinical Impression Statement Pt is a 83 y/o female who presents to OPPT for moderate complexity PT eval for back pain and bil lower extremity pain.  Pt with increased pain and significant tenderness throughout glutes, hamstrings and quads.  Pt also demonstrates gait abnormalities, lower extremity weakness and pain affecting functional mobility.  Will benefit from PT to address deficits.  Formal balance testing to follow as pain allows.   Rehab Potential Good   Clinical Impairments Affecting Rehab Potential multiple comorbidities   PT Frequency 2x / week   PT Duration 6 weeks   PT Treatment/Interventions ADLs/Self Care Home Management;Cryotherapy;Electrical Stimulation;Moist Heat;Ultrasound;Neuromuscular re-education;Therapeutic  exercise;Balance training;Therapeutic activities;Functional mobility training;Patient/family education;Gait training;Manual techniques;Taping;Dry needling   PT Next Visit Plan review HEP, add hamstring and quad stretch, initiate strengthening program, modalities PRN for pain   Consulted and Agree with Plan of Care Patient      Patient will benefit from skilled therapeutic intervention in order to improve the following deficits and impairments:  Abnormal gait, Decreased balance, Difficulty walking, Decreased mobility, Decreased strength, Pain, Increased fascial restricitons, Postural dysfunction, Impaired flexibility, Decreased activity tolerance  Visit Diagnosis: Chronic bilateral low back pain without sciatica - Plan: PT plan of care cert/re-cert  Muscle weakness (generalized) - Plan: PT plan of care cert/re-cert  Abnormal posture - Plan: PT plan of care cert/re-cert  Other abnormalities of gait and mobility - Plan: PT plan of care cert/re-cert      [facility] PT PB G-CODES - Aug 23, 2016 1536    Functional Assessment Tool Used  clinical judgement; unable to walk > 5 min, bil hip/knee weakness   Functional Limitations Mobility: Walking and moving around   Mobility: Walking and Moving Around Current Status At least 40 percent but less than 60 percent impaired, limited or restricted   Mobility: Walking and Moving Around Goal Status [id number]) At least 20 percent but less than 40 percent impaired, limited or restricted       Problem List Patient Active Problem List   Diagnosis Date Noted  . Chronic bilateral low back pain 08/12/2016  . Spondylosis of lumbar joint 08/12/2016  . Bilateral leg pain 08/12/2016  . Neck mass 04/15/2015  . Status post hip hemiarthroplasty 07/31/2013  . CAD (coronary artery disease) 07/31/2013  . CKD (chronic kidney disease), stage III 07/31/2013  .  Left bundle branch block 07/31/2013  . Severe calcific aortic stenosis 06/18/2010  . Diabetic polyneuropathy ([place])  04/16/2010  . GLAUCOMA 01/06/2009  . CHEST PAIN, ATYPICAL 12/16/2008  . SHOULDER PAIN, RIGHT 11/16/2007  . OSTEOARTHRITIS 02/02/2007  . Type 2 diabetes mellitus with renal manifestations, controlled ([place]) 10/24/2006  . Dyslipidemia 10/02/2006  . Essential hypertension 10/02/2006      Laureen Abrahams, PT, DPT 08/22/16 3:39 PM    [facility] [address] [place], [place], [address] Phone: [phone]   Fax:  [phone]  Name: Sara Garza MRN: [id number] Date of Birth: 27-Apr-1923

## 2016-08-27 ENCOUNTER — Ambulatory Visit (INDEPENDENT_AMBULATORY_CARE_PROVIDER_SITE_OTHER): Payer: Medicare Other | Admitting: Physical Therapy

## 2016-08-27 DIAGNOSIS — R2689 Other abnormalities of gait and mobility: Secondary | ICD-10-CM

## 2016-08-27 DIAGNOSIS — R293 Abnormal posture: Secondary | ICD-10-CM

## 2016-08-27 DIAGNOSIS — M545 Low back pain, unspecified: Secondary | ICD-10-CM

## 2016-08-27 DIAGNOSIS — G8929 Other chronic pain: Secondary | ICD-10-CM

## 2016-08-27 DIAGNOSIS — M6281 Muscle weakness (generalized): Secondary | ICD-10-CM

## 2016-08-27 NOTE — Therapy (Signed)
[facility] [address] [place], [place], [address] Phone: [phone]   Fax:  [phone]  Physical Therapy Treatment  Patient Details  Name: Sara Garza MRN: [id number] Date of Birth: 08-Jan-1924 Referring Provider: Dr. Legrand Como Rigby/Nafziger, [name], NP  Encounter Date: 08/27/2016      PT End of Session - 08/27/16 1117    Visit Number 2   Number of Visits 12   Date for PT Re-Evaluation 10/03/16   Authorization Type Medicare - G Codes and Progress Notes   PT Start Time 1017   PT Stop Time 1115   PT Time Calculation (min) 58 min   Activity Tolerance Patient tolerated treatment well   Behavior During Therapy [facility] for tasks assessed/performed      Past Medical History:  Diagnosis Date  . Arthritis   . CAD (coronary artery disease)   . Carotid stenosis, left    50-60% stable  . Diabetes mellitus   . Glaucoma   . Hyperlipidemia   . Hypertension   . Leg pain    ABIs 2/18: normal bilaterally.  . Osteoporosis     Past Surgical History:  Procedure Laterality Date  . ANOMALOUS PULMONARY VENOUS RETURN REPAIR, TOTAL    . APPENDECTOMY    . CARDIAC VALVE REPLACEMENT  04/29/2012  . Cataracts Bilateral   . CESAREAN SECTION    . FOOT SURGERY     [name]  . HIP ARTHROPLASTY Right 07/31/2013   Procedure: ARTHROPLASTY BIPOLAR HIP;  Surgeon: [name], MD;  Location: [facility];  Service: Orthopedics;  Laterality: Right;  . PERCUTANEOUS CORONARY STENT INTERVENTION (PCI-S) N/A 01/02/2012   Procedure: PERCUTANEOUS CORONARY STENT INTERVENTION (PCI-S);  Surgeon: Sherren Mocha, MD;  Location: [facility] CATH LAB;  Service: Cardiovascular;  Laterality: N/A;    There were no vitals filed for this visit.      Subjective Assessment - 08/27/16 1019    Subjective has been hurting, no better or worse.     Pertinent History Rt hip hemiarthroplasty 2015, OA, CAD, DM, HLD, HTN, osteoporosis   How long can you walk comfortably? < 5 min with cane   Patient Stated Goals be more comfortable and walk with decreased pain; fearful of falling and c/o LE weakness   Currently in Pain? Yes   Pain Score 8    Pain Location Buttocks   Pain Orientation Right;Left;Posterior   Pain Descriptors / Indicators Constant   Pain Type Chronic pain   Pain Radiating Towards into bil lower extremities   Pain Onset More than a month ago   Pain Frequency Constant   Aggravating Factors  walking, standing   Pain Relieving Factors tylenol                         [facility] Adult PT Treatment/Exercise - 08/27/16 1025      Lumbar Exercises: Stretches   Single Knee to Chest Stretch 3 reps;30 seconds   Single Knee to Chest Stretch Limitations bil   Lower Trunk Rotation 3 reps;30 seconds   Lower Trunk Rotation Limitations bil   Piriformis Stretch 3 reps;30 seconds   Piriformis Stretch Limitations bil     Modalities   Modalities Electrical Stimulation;Moist Heat     Moist Heat Therapy   Number Minutes Moist Heat 15 Minutes   Moist Heat Location Lumbar Spine     Electrical Stimulation   Electrical Stimulation Location bil glut/upper hamstring   Electrical Stimulation Action premod   Electrical Stimulation  Parameters to tolerance x 15 min   Electrical Stimulation Goals Pain     Manual Therapy   Manual Therapy Myofascial release   Myofascial Release bil quads, hamstrings, gluts-use of tennis ball for release.  Pt very sensitive to light pressure so tennis ball utilized                     PT Long Term Goals - 08/22/16 1532      PT LONG TERM GOAL #1   Title independent with HEP (10/03/16)   Time 6   Period Weeks   Status New     PT LONG TERM GOAL #2   Title report ability to walk for > 10 min without increase in pain for improved functional mobility (10/03/16)   Time 6   Period Weeks   Status New     PT LONG TERM GOAL #3   Title demonstrate 4/5 bil hip strength for improved functional mobility (10/03/16)   Time 6   Period  Weeks   Status New     PT LONG TERM GOAL #4   Title perform sit to/from stand without UE support at least 5 times for improved functional strength (10/03/16)   Time 6   Period Weeks   Status New     PT LONG TERM GOAL #5   Title formal balance goals to follow if indicated    Time 6   Period Weeks   Status New               Plan - 08/27/16 1117    Clinical Impression Statement Pt arrived to PT today rating pain 7/10.  Reviewed HEP and instructed to stop bridging for now as that increases pain, and all other exercises are okay to continue.  Pain following manual and modalities down to 5/10 and pt reported improved mobility.  Will continue to benefit from PT to maximize functional mobility.   Rehab Potential Good   Clinical Impairments Affecting Rehab Potential multiple comorbidities   PT Treatment/Interventions ADLs/Self Care Home Management;Cryotherapy;Electrical Stimulation;Moist Heat;Ultrasound;Neuromuscular re-education;Therapeutic exercise;Balance training;Therapeutic activities;Functional mobility training;Patient/family education;Gait training;Manual techniques;Taping;Dry needling   PT Next Visit Plan add hamstring and quad stretch, initiate strengthening program once pain controlled, manual/modalities PRN for pain   Consulted and Agree with Plan of Care Patient      Patient will benefit from skilled therapeutic intervention in order to improve the following deficits and impairments:  Abnormal gait, Decreased balance, Difficulty walking, Decreased mobility, Decreased strength, Pain, Increased fascial restricitons, Postural dysfunction, Impaired flexibility, Decreased activity tolerance  Visit Diagnosis: Chronic bilateral low back pain without sciatica  Muscle weakness (generalized)  Abnormal posture  Other abnormalities of gait and mobility     Problem List Patient Active Problem List   Diagnosis Date Noted  . Chronic bilateral low back pain 08/12/2016  .  Spondylosis of lumbar joint 08/12/2016  . Bilateral leg pain 08/12/2016  . Neck mass 04/15/2015  . Status post hip hemiarthroplasty 07/31/2013  . CAD (coronary artery disease) 07/31/2013  . CKD (chronic kidney disease), stage III 07/31/2013  . Left bundle branch block 07/31/2013  . Severe calcific aortic stenosis 06/18/2010  . Diabetic polyneuropathy ([place]) 04/16/2010  . GLAUCOMA 01/06/2009  . CHEST PAIN, ATYPICAL 12/16/2008  . SHOULDER PAIN, RIGHT 11/16/2007  . OSTEOARTHRITIS 02/02/2007  . Type 2 diabetes mellitus with renal manifestations, controlled ([place]) 10/24/2006  . Dyslipidemia 10/02/2006  . Essential hypertension 10/02/2006      Laureen Abrahams, PT, DPT 08/27/16  11:20 AM    [facility] [address] [place], [place], [address] Phone: [phone]   Fax:  [phone]  Name: TWYLAH BENNETTS MRN: [id number] Date of Birth: [birth year]

## 2016-08-29 ENCOUNTER — Ambulatory Visit (INDEPENDENT_AMBULATORY_CARE_PROVIDER_SITE_OTHER): Payer: Medicare Other | Admitting: Physical Therapy

## 2016-08-29 DIAGNOSIS — M6281 Muscle weakness (generalized): Secondary | ICD-10-CM

## 2016-08-29 DIAGNOSIS — R293 Abnormal posture: Secondary | ICD-10-CM | POA: Diagnosis not present

## 2016-08-29 DIAGNOSIS — G8929 Other chronic pain: Secondary | ICD-10-CM | POA: Diagnosis not present

## 2016-08-29 DIAGNOSIS — R2689 Other abnormalities of gait and mobility: Secondary | ICD-10-CM | POA: Diagnosis not present

## 2016-08-29 DIAGNOSIS — M545 Low back pain, unspecified: Secondary | ICD-10-CM

## 2016-08-29 NOTE — Therapy (Signed)
[facility] [address] [place], [place], [address] Phone: [phone]   Fax:  [phone]  Physical Therapy Treatment  Patient Details  Name: Sara Garza MRN: [id number] Date of Birth: 07/16/1923 Referring Provider: Dr. Legrand Como Rigby/Nafziger, [name], NP  Encounter Date: 08/29/2016      PT End of Session - 08/29/16 1343    Visit Number 3   Number of Visits 12   Date for PT Re-Evaluation 10/03/16   Authorization Type Medicare - G Codes and Progress Notes   PT Start Time 1301   PT Stop Time 1356   PT Time Calculation (min) 55 min   Activity Tolerance Patient tolerated treatment well;Patient limited by pain   Behavior During Therapy [facility] for tasks assessed/performed      Past Medical History:  Diagnosis Date  . Arthritis   . CAD (coronary artery disease)   . Carotid stenosis, left    50-60% stable  . Diabetes mellitus   . Glaucoma   . Hyperlipidemia   . Hypertension   . Leg pain    ABIs 2/18: normal bilaterally.  . Osteoporosis     Past Surgical History:  Procedure Laterality Date  . ANOMALOUS PULMONARY VENOUS RETURN REPAIR, TOTAL    . APPENDECTOMY    . CARDIAC VALVE REPLACEMENT  04/29/2012  . Cataracts Bilateral   . CESAREAN SECTION    . FOOT SURGERY     [name]  . HIP ARTHROPLASTY Right 07/31/2013   Procedure: ARTHROPLASTY BIPOLAR HIP;  Surgeon: [name], MD;  Location: [facility];  Service: Orthopedics;  Laterality: Right;  . PERCUTANEOUS CORONARY STENT INTERVENTION (PCI-S) N/A 01/02/2012   Procedure: PERCUTANEOUS CORONARY STENT INTERVENTION (PCI-S);  Surgeon: Sherren Mocha, MD;  Location: [facility] CATH LAB;  Service: Cardiovascular;  Laterality: N/A;    There were no vitals filed for this visit.      Subjective Assessment - 08/29/16 1308    Subjective pain seems to be a little better; felt "great" after last session but then "this morning was awful."  rates pain 5/10 today   Pertinent History Rt hip  hemiarthroplasty 2015, OA, CAD, DM, HLD, HTN, osteoporosis   Patient Stated Goals be more comfortable and walk with decreased pain; fearful of falling and c/o LE weakness   Currently in Pain? Yes   Pain Score 5    Pain Location Back   Pain Orientation Right;Left;Lower;Posterior   Pain Descriptors / Indicators Constant   Pain Type Chronic pain   Pain Radiating Towards into bil lower extremities   Pain Onset More than a month ago   Pain Frequency Constant   Aggravating Factors  walking, standing   Pain Relieving Factors tylenol                         [facility] Adult PT Treatment/Exercise - 08/29/16 1311      Lumbar Exercises: Stretches   Single Knee to Chest Stretch 3 reps;30 seconds   Single Knee to Chest Stretch Limitations bil   Lower Trunk Rotation 3 reps;30 seconds   Lower Trunk Rotation Limitations bil   Quad Stretch 3 reps;30 seconds   Quad Stretch Limitations bil; supine off mat   Piriformis Stretch 3 reps;30 seconds   Piriformis Stretch Limitations bil     Lumbar Exercises: Supine   Ab Set 10 reps;5 seconds   AB Set Limitations min cues for technique     Modalities   Modalities Electrical Stimulation;Moist Heat  Moist Heat Therapy   Number Minutes Moist Heat 15 Minutes   Moist Heat Location Lumbar Spine     Electrical Stimulation   Electrical Stimulation Location bil glut/upper hamstring   Electrical Stimulation Action premod   Electrical Stimulation Parameters to tolerance x 15 min   Electrical Stimulation Goals Pain     Manual Therapy   Manual Therapy Myofascial release   Myofascial Release bil quads, hamstrings, gluts-use of tennis ball for release.  Pt very sensitive to light pressure so tennis ball utilized                     PT Long Term Goals - 08/22/16 1532      PT LONG TERM GOAL #1   Title independent with HEP (10/03/16)   Time 6   Period Weeks   Status New     PT LONG TERM GOAL #2   Title report ability to walk  for > 10 min without increase in pain for improved functional mobility (10/03/16)   Time 6   Period Weeks   Status New     PT LONG TERM GOAL #3   Title demonstrate 4/5 bil hip strength for improved functional mobility (10/03/16)   Time 6   Period Weeks   Status New     PT LONG TERM GOAL #4   Title perform sit to/from stand without UE support at least 5 times for improved functional strength (10/03/16)   Time 6   Period Weeks   Status New     PT LONG TERM GOAL #5   Title formal balance goals to follow if indicated    Time 6   Period Weeks   Status New               Plan - 08/29/16 1343    Clinical Impression Statement Pt with improved pain today but continues to be elevated and c/o difficulty walking especially in the morning.  Pt very sensitive to manual therapy and palpation with diffuse pains in proximal lower extremities and hips and glutes.  Pt will continue to benefit from PT but at this time has poor tolerance to exercise and activity due to pain.   PT Treatment/Interventions ADLs/Self Care Home Management;Cryotherapy;Electrical Stimulation;Moist Heat;Ultrasound;Neuromuscular re-education;Therapeutic exercise;Balance training;Therapeutic activities;Functional mobility training;Patient/family education;Gait training;Manual techniques;Taping;Dry needling   PT Next Visit Plan add hamstring and quad stretch, initiate strengthening program once pain controlled, manual/modalities PRN for pain   Consulted and Agree with Plan of Care Patient      Patient will benefit from skilled therapeutic intervention in order to improve the following deficits and impairments:  Abnormal gait, Decreased balance, Difficulty walking, Decreased mobility, Decreased strength, Pain, Increased fascial restricitons, Postural dysfunction, Impaired flexibility, Decreased activity tolerance  Visit Diagnosis: Chronic bilateral low back pain without sciatica  Muscle weakness (generalized)  Abnormal  posture  Other abnormalities of gait and mobility     Problem List Patient Active Problem List   Diagnosis Date Noted  . Chronic bilateral low back pain 08/12/2016  . Spondylosis of lumbar joint 08/12/2016  . Bilateral leg pain 08/12/2016  . Neck mass 04/15/2015  . Status post hip hemiarthroplasty 07/31/2013  . CAD (coronary artery disease) 07/31/2013  . CKD (chronic kidney disease), stage III 07/31/2013  . Left bundle branch block 07/31/2013  . Severe calcific aortic stenosis 06/18/2010  . Diabetic polyneuropathy ([place]) 04/16/2010  . GLAUCOMA 01/06/2009  . CHEST PAIN, ATYPICAL 12/16/2008  . SHOULDER PAIN, RIGHT 11/16/2007  . OSTEOARTHRITIS  02/02/2007  . Type 2 diabetes mellitus with renal manifestations, controlled ([place]) 10/24/2006  . Dyslipidemia 10/02/2006  . Essential hypertension 10/02/2006      Laureen Abrahams, PT, DPT 08/29/16 1:46 PM    [facility] [place], [place], [address] Phone: [phone]   Fax:  [phone]  Name: EMYLIA LATELLA MRN: [id number] Date of Birth: [birth year]

## 2016-08-30 ENCOUNTER — Other Ambulatory Visit: Payer: Self-pay | Admitting: Cardiovascular Disease

## 2016-08-30 DIAGNOSIS — I5033 Acute on chronic diastolic (congestive) heart failure: Secondary | ICD-10-CM

## 2016-08-30 NOTE — Telephone Encounter (Signed)
Medication Detail    Disp Refills Start End   furosemide (LASIX) 20 MG tablet 45 tablet 3 07/11/2016    Sig - Route: Take 0.5 tablets (10 mg total) by mouth daily. - Oral   E-Prescribing Status: Receipt confirmed by pharmacy (07/11/2016 2:14 PM EDT)   Pharmacy   [id number] - [place], [address] AT [address] [place]

## 2016-09-02 ENCOUNTER — Ambulatory Visit (INDEPENDENT_AMBULATORY_CARE_PROVIDER_SITE_OTHER): Payer: Medicare Other | Admitting: Physical Therapy

## 2016-09-02 DIAGNOSIS — G8929 Other chronic pain: Secondary | ICD-10-CM

## 2016-09-02 DIAGNOSIS — M6281 Muscle weakness (generalized): Secondary | ICD-10-CM

## 2016-09-02 DIAGNOSIS — M545 Low back pain, unspecified: Secondary | ICD-10-CM

## 2016-09-02 DIAGNOSIS — R2689 Other abnormalities of gait and mobility: Secondary | ICD-10-CM | POA: Diagnosis not present

## 2016-09-02 DIAGNOSIS — R293 Abnormal posture: Secondary | ICD-10-CM

## 2016-09-02 NOTE — Therapy (Signed)
[facility] [address] [place], [place], [address] Phone: [phone]   Fax:  [phone]  Physical Therapy Treatment  Patient Details  Name: Sara Garza MRN: [id number] Date of Birth: 1923/11/06 Referring Provider: Dr. Legrand Como Rigby/Nafziger, [name], NP  Encounter Date: 09/02/2016      PT End of Session - 09/02/16 1355    Visit Number 4   Number of Visits 12   Date for PT Re-Evaluation 10/03/16   Authorization Type Medicare - G Codes and Progress Notes   PT Start Time 1301   PT Stop Time 1407   PT Time Calculation (min) 66 min   Activity Tolerance Patient tolerated treatment well;No increased pain   Behavior During Therapy [facility] for tasks assessed/performed      Past Medical History:  Diagnosis Date  . Arthritis   . CAD (coronary artery disease)   . Carotid stenosis, left    50-60% stable  . Diabetes mellitus   . Glaucoma   . Hyperlipidemia   . Hypertension   . Leg pain    ABIs 2/18: normal bilaterally.  . Osteoporosis     Past Surgical History:  Procedure Laterality Date  . ANOMALOUS PULMONARY VENOUS RETURN REPAIR, TOTAL    . APPENDECTOMY    . CARDIAC VALVE REPLACEMENT  04/29/2012  . Cataracts Bilateral   . CESAREAN SECTION    . FOOT SURGERY     [name]  . HIP ARTHROPLASTY Right 07/31/2013   Procedure: ARTHROPLASTY BIPOLAR HIP;  Surgeon: [name], MD;  Location: [facility];  Service: Orthopedics;  Laterality: Right;  . PERCUTANEOUS CORONARY STENT INTERVENTION (PCI-S) N/A 01/02/2012   Procedure: PERCUTANEOUS CORONARY STENT INTERVENTION (PCI-S);  Surgeon: Sherren Mocha, MD;  Location: [facility] CATH LAB;  Service: Cardiovascular;  Laterality: N/A;    There were no vitals filed for this visit.      Subjective Assessment - 09/02/16 1306    Subjective "I just can't walk."  Reports she went to a play yesterday and sat for 3 hours without really getting up.  Doesn't know what is going on and why she's hurting so much.   Pain was an 8/10 this morning.   Pertinent History Rt hip hemiarthroplasty 2015, OA, CAD, DM, HLD, HTN, osteoporosis   How long can you walk comfortably? < 5 min with cane   Patient Stated Goals be more comfortable and walk with decreased pain; fearful of falling and c/o LE weakness   Currently in Pain? Yes   Pain Score 7    Pain Location Back   Pain Orientation Lower   Pain Descriptors / Indicators Constant   Pain Type Chronic pain   Pain Radiating Towards into bil lower extremities   Pain Onset More than a month ago   Pain Frequency Constant   Aggravating Factors  walking, standing   Pain Relieving Factors tylenol                         [facility] Adult PT Treatment/Exercise - 09/02/16 1307      Exercises   Exercises Knee/Hip     Lumbar Exercises: Aerobic   Stationary Bike L1 x 6 min     Knee/Hip Exercises: Standing   Heel Raises Both;20 reps   Heel Raises Limitations toe raises x 20 reps   Knee Flexion Both;10 reps   Hip Flexion Both;10 reps;Knee bent   Hip Abduction Both;10 reps;Knee straight   Hip Extension Both;10 reps;Knee straight  Functional Squat 10 reps   Functional Squat Limitations mini-squat     Knee/Hip Exercises: Seated   Long Arc Quad Both;10 reps;Weights   Long Arc Quad Weight 1 lbs.   Marching Both;10 reps;Weights   Marching Weights 1 lbs.   Hamstring Curl Both;10 reps   Hamstring Limitations red theraband     Modalities   Modalities Electrical Stimulation;Moist Heat     Moist Heat Therapy   Number Minutes Moist Heat 15 Minutes   Moist Heat Location Lumbar Spine     Electrical Stimulation   Electrical Stimulation Location bil glut/upper hamstring   Electrical Stimulation Action premod   Electrical Stimulation Parameters to tolerance x 15 min   Electrical Stimulation Goals Pain                     PT Long Term Goals - 08/22/16 1532      PT LONG TERM GOAL #1   Title independent with HEP (10/03/16)   Time 6    Period Weeks   Status New     PT LONG TERM GOAL #2   Title report ability to walk for > 10 min without increase in pain for improved functional mobility (10/03/16)   Time 6   Period Weeks   Status New     PT LONG TERM GOAL #3   Title demonstrate 4/5 bil hip strength for improved functional mobility (10/03/16)   Time 6   Period Weeks   Status New     PT LONG TERM GOAL #4   Title perform sit to/from stand without UE support at least 5 times for improved functional strength (10/03/16)   Time 6   Period Weeks   Status New     PT LONG TERM GOAL #5   Title formal balance goals to follow if indicated    Time 6   Period Weeks   Status New               Plan - 09/02/16 1355    Clinical Impression Statement Pt continues to report increased pain and c/o "bad days" and pain rated > 7/10.  Pt reports pain yesterday that was "really bad, worse than an 8" after sitting for 3 hours.  Educated on need for frequent position changes, and focused today's session on standing exercise to increase activity.  Pt reported no increase in pain with standing activities (with seated rest breaks) and that pain improves with estim and heat.  Will continue to benefit from PT to maximize function.  Pain rated 5/10 after session.   PT Treatment/Interventions ADLs/Self Care Home Management;Cryotherapy;Electrical Stimulation;Moist Heat;Ultrasound;Neuromuscular re-education;Therapeutic exercise;Balance training;Therapeutic activities;Functional mobility training;Patient/family education;Gait training;Manual techniques;Taping;Dry needling   PT Next Visit Plan strengthening and functional mobility, stretching PRN, manual/modalities PRN   Consulted and Agree with Plan of Care Patient      Patient will benefit from skilled therapeutic intervention in order to improve the following deficits and impairments:  Abnormal gait, Decreased balance, Difficulty walking, Decreased mobility, Decreased strength, Pain, Increased  fascial restricitons, Postural dysfunction, Impaired flexibility, Decreased activity tolerance  Visit Diagnosis: Chronic bilateral low back pain without sciatica  Muscle weakness (generalized)  Abnormal posture  Other abnormalities of gait and mobility     Problem List Patient Active Problem List   Diagnosis Date Noted  . Chronic bilateral low back pain 08/12/2016  . Spondylosis of lumbar joint 08/12/2016  . Bilateral leg pain 08/12/2016  . Neck mass 04/15/2015  . Status post hip hemiarthroplasty  07/31/2013  . CAD (coronary artery disease) 07/31/2013  . CKD (chronic kidney disease), stage III 07/31/2013  . Left bundle branch block 07/31/2013  . Severe calcific aortic stenosis 06/18/2010  . Diabetic polyneuropathy ([place]) 04/16/2010  . GLAUCOMA 01/06/2009  . CHEST PAIN, ATYPICAL 12/16/2008  . SHOULDER PAIN, RIGHT 11/16/2007  . OSTEOARTHRITIS 02/02/2007  . Type 2 diabetes mellitus with renal manifestations, controlled ([place]) 10/24/2006  . Dyslipidemia 10/02/2006  . Essential hypertension 10/02/2006      Laureen Abrahams, PT, DPT 09/02/16 2:08 PM    [facility] [place], [place], [address] Phone: [phone]   Fax:  [phone]  Name: Sara Garza MRN: [id number] Date of Birth: [birth year]

## 2016-09-05 ENCOUNTER — Ambulatory Visit (INDEPENDENT_AMBULATORY_CARE_PROVIDER_SITE_OTHER): Payer: Medicare Other | Admitting: Physical Therapy

## 2016-09-05 DIAGNOSIS — M545 Low back pain, unspecified: Secondary | ICD-10-CM

## 2016-09-05 DIAGNOSIS — R2689 Other abnormalities of gait and mobility: Secondary | ICD-10-CM

## 2016-09-05 DIAGNOSIS — M6281 Muscle weakness (generalized): Secondary | ICD-10-CM | POA: Diagnosis not present

## 2016-09-05 DIAGNOSIS — R293 Abnormal posture: Secondary | ICD-10-CM | POA: Diagnosis not present

## 2016-09-05 DIAGNOSIS — G8929 Other chronic pain: Secondary | ICD-10-CM | POA: Diagnosis not present

## 2016-09-05 NOTE — Patient Instructions (Signed)
TENS UNIT  This is helpful for muscle pain and spasm.   Search and Purchase a TENS 7000 2nd edition at www.tenspros.com or www.amazon.com  (It should be less than $30)     TENS unit instructions:   Do not shower or bathe with the unit on  Turn the unit off before removing electrodes or batteries  If the electrodes lose stickiness add a drop of water to the electrodes after they are disconnected from the unit and place on plastic sheet. If you continued to have difficulty, call the TENS unit company to purchase more electrodes.  Do not apply lotion on the skin area prior to use. Make sure the skin is clean and dry as this will help prolong the life of the electrodes.  After use, always check skin for unusual red areas, rash or other skin difficulties. If there are any skin problems, does not apply electrodes to the same area.  Never remove the electrodes from the unit by pulling the wires.  Do not use the TENS unit or electrodes other than as directed.  Do not change electrode placement without consulting your therapist or physician.  Keep 2 fingers with between each electrode. TENS stands for Transcutaneous Electrical Nerve Stimulation. In other words, electrical impulses are allowed to pass through the skin in order to excite a nerve.   Purpose and Use of TENS:  TENS is a method used to manage acute and chronic pain without the use of drugs. It has been effective in managing pain associated with surgery, sprains, strains, trauma, rheumatoid arthritis, and neuralgias. It is a non-addictive, low risk, and non-invasive technique used to control pain. It is not, by any means, a curative form of treatment.   How TENS Works:  Most TENS units are a Paramedic unit powered by one 9 volt battery. Attached to the outside of the unit are two lead wires where two pins and/or snaps connect on each wire. All units come with a set of four reusable pads or electrodes. These are placed on  the skin surrounding the area involved. By inserting the leads into  the pads, the electricity can pass from the unit making the circuit complete.  As the intensity is turned up slowly, the electrical current enters the body from the electrodes through the skin to the surrounding nerve fibers. This triggers the release of hormones from within the body. These hormones contain pain relievers. By increasing the circulation of these hormones, the person's pain may be lessened. It is also believed that the electrical stimulation itself helps to block the pain messages being sent to the brain, thus also decreasing the body's perception of pain.   Hazards:  TENS units are NOT to be used by patients with PACEMAKERS, DEFIBRILLATORS, DIABETIC PUMPS, PREGNANT WOMEN, and patients with SEIZURE DISORDERS.  TENS units are NOT to be used over the heart, throat, brain, or spinal cord.  One of the major side effects from the TENS unit may be skin irritation. Some people may develop a rash if they are sensitive to the materials used in the electrodes or the connecting wires.   Wear the unit for 15-30 minutes.   Avoid overuse due the body getting used to the stem making it not as effective over time.

## 2016-09-05 NOTE — Therapy (Signed)
[facility] [address] [place], [place], [address] Phone: [phone]   Fax:  [phone]  Physical Therapy Treatment  Patient Details  Name: Sara Garza MRN: [id number] Date of Birth: 05-17-1923 Referring Provider: Dr. Legrand Como Rigby/Nafziger, [name], NP  Encounter Date: 09/05/2016      PT End of Session - 09/05/16 1344    Visit Number 5   Number of Visits 12   Date for PT Re-Evaluation 10/03/16   Authorization Type Medicare - G Codes and Progress Notes   PT Start Time 6063   PT Stop Time 0160   PT Time Calculation (min) 39 min   Activity Tolerance Patient tolerated treatment well;No increased pain   Behavior During Therapy WFL for tasks assessed/performed      Past Medical History:  Diagnosis Date  . Arthritis   . CAD (coronary artery disease)   . Carotid stenosis, left    50-60% stable  . Diabetes mellitus   . Glaucoma   . Hyperlipidemia   . Hypertension   . Leg pain    ABIs 2/18: normal bilaterally.  . Osteoporosis     Past Surgical History:  Procedure Laterality Date  . ANOMALOUS PULMONARY VENOUS RETURN REPAIR, TOTAL    . APPENDECTOMY    . CARDIAC VALVE REPLACEMENT  04/29/2012  . Cataracts Bilateral   . CESAREAN SECTION    . FOOT SURGERY     [name]  . HIP ARTHROPLASTY Right 07/31/2013   Procedure: ARTHROPLASTY BIPOLAR HIP;  Surgeon: [name], MD;  Location: [facility];  Service: Orthopedics;  Laterality: Right;  . PERCUTANEOUS CORONARY STENT INTERVENTION (PCI-S) N/A 01/02/2012   Procedure: PERCUTANEOUS CORONARY STENT INTERVENTION (PCI-S);  Surgeon: Sherren Mocha, MD;  Location: [facility] CATH LAB;  Service: Cardiovascular;  Laterality: N/A;    There were no vitals filed for this visit.      Subjective Assessment - 09/05/16 1309    Subjective doing the same.  reports she always feels better after she leaves here.   Pertinent History Rt hip hemiarthroplasty 2015, OA, CAD, DM, HLD, HTN, osteoporosis   Patient  Stated Goals be more comfortable and walk with decreased pain; fearful of falling and c/o LE weakness   Currently in Pain? Yes   Pain Score 5    Pain Location Back   Pain Orientation Lower   Pain Descriptors / Indicators Constant   Pain Type Chronic pain   Pain Onset More than a month ago   Pain Frequency Constant   Aggravating Factors  walking, standing   Pain Relieving Factors tylenol                         [facility] Adult PT Treatment/Exercise - 09/05/16 1313      Ambulation/Gait   Gait Comments gait for endurance: amb 162' in 3 min before needing seated rest break; amb 165' in 2:30     Lumbar Exercises: Aerobic   Stationary Bike L1 x 7 min     Knee/Hip Exercises: Seated   Long Arc Quad Both;10 reps   Long Arc Quad Weight 2 lbs.   Ball Squeeze 10 reps x 5 sec   Other Seated Knee/Hip Exercises heel raises/toe raises x 10 reps each   Marching Both;10 reps;Weights   Marching Weights 2 lbs.   Sit to Sand 10 reps;without UE support                     PT  Long Term Goals - 08/22/16 1532      PT LONG TERM GOAL #1   Title independent with HEP (10/03/16)   Time 6   Period Weeks   Status New     PT LONG TERM GOAL #2   Title report ability to walk for > 10 min without increase in pain for improved functional mobility (10/03/16)   Time 6   Period Weeks   Status New     PT LONG TERM GOAL #3   Title demonstrate 4/5 bil hip strength for improved functional mobility (10/03/16)   Time 6   Period Weeks   Status New     PT LONG TERM GOAL #4   Title perform sit to/from stand without UE support at least 5 times for improved functional strength (10/03/16)   Time 6   Period Weeks   Status New     PT LONG TERM GOAL #5   Title formal balance goals to follow if indicated    Time 6   Period Weeks   Status New               Plan - 09/05/16 1427    Clinical Impression Statement Session today focued on endurance and strengthening, as well as education  on where to purchase TENS unit.  Recommended pt try to increase walking to more times during the day to help with endurance and physical activity.  Educated on TENS unit, but pt unsure if she wants to purchase at this time.  Pain decreased at beginning of session today and remained the same by end of session.  Slight improvements noted.   PT Treatment/Interventions ADLs/Self Care Home Management;Cryotherapy;Electrical Stimulation;Moist Heat;Ultrasound;Neuromuscular re-education;Therapeutic exercise;Balance training;Therapeutic activities;Functional mobility training;Patient/family education;Gait training;Manual techniques;Taping;Dry needling   PT Next Visit Plan strengthening and functional mobility, stretching PRN, manual/modalities PRN   Consulted and Agree with Plan of Care Patient      Patient will benefit from skilled therapeutic intervention in order to improve the following deficits and impairments:  Abnormal gait, Decreased balance, Difficulty walking, Decreased mobility, Decreased strength, Pain, Increased fascial restricitons, Postural dysfunction, Impaired flexibility, Decreased activity tolerance  Visit Diagnosis: Chronic bilateral low back pain without sciatica  Muscle weakness (generalized)  Abnormal posture  Other abnormalities of gait and mobility     Problem List Patient Active Problem List   Diagnosis Date Noted  . Chronic bilateral low back pain 08/12/2016  . Spondylosis of lumbar joint 08/12/2016  . Bilateral leg pain 08/12/2016  . Neck mass 04/15/2015  . Status post hip hemiarthroplasty 07/31/2013  . CAD (coronary artery disease) 07/31/2013  . CKD (chronic kidney disease), stage III 07/31/2013  . Left bundle branch block 07/31/2013  . Severe calcific aortic stenosis 06/18/2010  . Diabetic polyneuropathy ([place]) 04/16/2010  . GLAUCOMA 01/06/2009  . CHEST PAIN, ATYPICAL 12/16/2008  . SHOULDER PAIN, RIGHT 11/16/2007  . OSTEOARTHRITIS 02/02/2007  . Type 2 diabetes  mellitus with renal manifestations, controlled ([place]) 10/24/2006  . Dyslipidemia 10/02/2006  . Essential hypertension 10/02/2006      Laureen Abrahams, PT, DPT 09/05/16 2:29 PM    [facility] [address] [place], [place], [address] Phone: [phone]   Fax:  [phone]  Name: Sara Garza MRN: [id number] Date of Birth: Mar 31, 1924

## 2016-09-09 ENCOUNTER — Ambulatory Visit (INDEPENDENT_AMBULATORY_CARE_PROVIDER_SITE_OTHER): Payer: Medicare Other | Admitting: Physical Therapy

## 2016-09-09 ENCOUNTER — Telehealth: Payer: Self-pay | Admitting: Cardiovascular Disease

## 2016-09-09 DIAGNOSIS — M545 Low back pain, unspecified: Secondary | ICD-10-CM

## 2016-09-09 DIAGNOSIS — G8929 Other chronic pain: Secondary | ICD-10-CM | POA: Diagnosis not present

## 2016-09-09 DIAGNOSIS — R293 Abnormal posture: Secondary | ICD-10-CM

## 2016-09-09 DIAGNOSIS — M6281 Muscle weakness (generalized): Secondary | ICD-10-CM | POA: Diagnosis not present

## 2016-09-09 DIAGNOSIS — R2689 Other abnormalities of gait and mobility: Secondary | ICD-10-CM | POA: Diagnosis not present

## 2016-09-09 MED ORDER — AMOXICILLIN 500 MG PO CAPS: ORAL_CAPSULE | ORAL | 1 refills | Status: DC

## 2016-09-09 NOTE — Therapy (Signed)
[facility] [address] [place], [place], [address] Phone: [phone]   Fax:  [phone]  Physical Therapy Treatment  Patient Details  Name: Sara Garza MRN: [id number] Date of Birth: 1923/07/21 Referring Provider: Dr. Legrand Como Rigby/Nafziger, [name], NP  Encounter Date: 09/09/2016      PT End of Session - 09/09/16 1100    Visit Number 6   Number of Visits 12   Date for PT Re-Evaluation 10/03/16   Authorization Type Medicare - G Codes and Progress Notes   PT Start Time 1016   PT Stop Time 1057   PT Time Calculation (min) 41 min   Activity Tolerance Patient tolerated treatment well;No increased pain   Behavior During Therapy WFL for tasks assessed/performed      Past Medical History:  Diagnosis Date  . Arthritis   . CAD (coronary artery disease)   . Carotid stenosis, left    50-60% stable  . Diabetes mellitus   . Glaucoma   . Hyperlipidemia   . Hypertension   . Leg pain    ABIs 2/18: normal bilaterally.  . Osteoporosis     Past Surgical History:  Procedure Laterality Date  . ANOMALOUS PULMONARY VENOUS RETURN REPAIR, TOTAL    . APPENDECTOMY    . CARDIAC VALVE REPLACEMENT  04/29/2012  . Cataracts Bilateral   . CESAREAN SECTION    . FOOT SURGERY     [name]  . HIP ARTHROPLASTY Right 07/31/2013   Procedure: ARTHROPLASTY BIPOLAR HIP;  Surgeon: [name], MD;  Location: [facility];  Service: Orthopedics;  Laterality: Right;  . PERCUTANEOUS CORONARY STENT INTERVENTION (PCI-S) N/A 01/02/2012   Procedure: PERCUTANEOUS CORONARY STENT INTERVENTION (PCI-S);  Surgeon: Sherren Mocha, MD;  Location: [facility] CATH LAB;  Service: Cardiovascular;  Laterality: N/A;    There were no vitals filed for this visit.      Subjective Assessment - 09/09/16 1019    Subjective feels better today; can't identify a reason for feeling better but is glad she feels better.   Pertinent History Rt hip hemiarthroplasty 2015, OA, CAD, DM, HLD, HTN,  osteoporosis   Patient Stated Goals be more comfortable and walk with decreased pain; fearful of falling and c/o LE weakness   Currently in Pain? Yes   Pain Score 5    Pain Location Back   Pain Orientation Left   Pain Descriptors / Indicators Constant   Pain Type Chronic pain   Pain Onset More than a month ago   Pain Frequency Constant   Aggravating Factors  walking, standing   Pain Relieving Factors tylenol                         [facility] Adult PT Treatment/Exercise - 09/09/16 1021      Ambulation/Gait   Gait Comments gait for endurance: 4 min x 300'; 250' in 2 min with seated rest breaks needed     Lumbar Exercises: Aerobic   Stationary Bike L1 x 7 min     Knee/Hip Exercises: Seated   Long Arc Quad Both;15 reps   Long Arc Quad Weight 2 lbs.   Clamshell with TheraBand Red  x15 bil   Other Seated Knee/Hip Exercises heel raises/toe raises x 15 reps each   Marching Both;10 reps;Weights   Marching Weights 2 lbs.   Hamstring Curl Both;15 reps   Hamstring Limitations red theraband  PT Long Term Goals - 08/22/16 1532      PT LONG TERM GOAL #1   Title independent with HEP (10/03/16)   Time 6   Period Weeks   Status New     PT LONG TERM GOAL #2   Title report ability to walk for > 10 min without increase in pain for improved functional mobility (10/03/16)   Time 6   Period Weeks   Status New     PT LONG TERM GOAL #3   Title demonstrate 4/5 bil hip strength for improved functional mobility (10/03/16)   Time 6   Period Weeks   Status New     PT LONG TERM GOAL #4   Title perform sit to/from stand without UE support at least 5 times for improved functional strength (10/03/16)   Time 6   Period Weeks   Status New     PT LONG TERM GOAL #5   Title formal balance goals to follow if indicated    Time 6   Period Weeks   Status New               Plan - 09/09/16 1152    Clinical Impression Statement Pt reports improved  symptoms over weekend and today feels better.  Continued to focus on strengthening and endurance to help with deconditioning.  Increased ambulation time to 4 min today before needing rest break.  Seated rest breaks continue to be needed but seem to be decreasing in frequency and time.  Will plan to formally address goals next 1-2 sessions in preparation for MD follow up.   PT Treatment/Interventions ADLs/Self Care Home Management;Cryotherapy;Electrical Stimulation;Moist Heat;Ultrasound;Neuromuscular re-education;Therapeutic exercise;Balance training;Therapeutic activities;Functional mobility training;Patient/family education;Gait training;Manual techniques;Taping;Dry needling   PT Next Visit Plan look at goals, continue endurance/functional mobility/strengthening, manual/stretching/modalities PRN   Consulted and Agree with Plan of Care Patient      Patient will benefit from skilled therapeutic intervention in order to improve the following deficits and impairments:  Abnormal gait, Decreased balance, Difficulty walking, Decreased mobility, Decreased strength, Pain, Increased fascial restricitons, Postural dysfunction, Impaired flexibility, Decreased activity tolerance  Visit Diagnosis: Chronic bilateral low back pain without sciatica  Muscle weakness (generalized)  Abnormal posture  Other abnormalities of gait and mobility     Problem List Patient Active Problem List   Diagnosis Date Noted  . Chronic bilateral low back pain 08/12/2016  . Spondylosis of lumbar joint 08/12/2016  . Bilateral leg pain 08/12/2016  . Neck mass 04/15/2015  . Status post hip hemiarthroplasty 07/31/2013  . CAD (coronary artery disease) 07/31/2013  . CKD (chronic kidney disease), stage III 07/31/2013  . Left bundle branch block 07/31/2013  . Severe calcific aortic stenosis 06/18/2010  . Diabetic polyneuropathy ([place]) 04/16/2010  . GLAUCOMA 01/06/2009  . CHEST PAIN, ATYPICAL 12/16/2008  . SHOULDER PAIN, RIGHT  11/16/2007  . OSTEOARTHRITIS 02/02/2007  . Type 2 diabetes mellitus with renal manifestations, controlled ([place]) 10/24/2006  . Dyslipidemia 10/02/2006  . Essential hypertension 10/02/2006      Laureen Abrahams, PT, DPT 09/09/16 11:56 AM    [facility] [address] [place], [place], [address] Phone: [phone]   Fax:  [phone]  Name: Sara Garza MRN: [id number] Date of Birth: 16-Mar-1924

## 2016-09-09 NOTE — Telephone Encounter (Signed)
Sara Garza is calling because she is needing a prescription sent to [facility] for Amoxicillin . She has a dental appointment on Wednesday . Please call if you have any questions . Thanks

## 2016-09-09 NOTE — Telephone Encounter (Signed)
Rx sent to the pharmacy per pt's request.

## 2016-09-12 ENCOUNTER — Ambulatory Visit (INDEPENDENT_AMBULATORY_CARE_PROVIDER_SITE_OTHER): Payer: Medicare Other | Admitting: Physical Therapy

## 2016-09-12 DIAGNOSIS — M545 Low back pain: Secondary | ICD-10-CM

## 2016-09-12 DIAGNOSIS — R293 Abnormal posture: Secondary | ICD-10-CM

## 2016-09-12 DIAGNOSIS — M6281 Muscle weakness (generalized): Secondary | ICD-10-CM

## 2016-09-12 DIAGNOSIS — R2689 Other abnormalities of gait and mobility: Secondary | ICD-10-CM | POA: Diagnosis not present

## 2016-09-12 DIAGNOSIS — G8929 Other chronic pain: Secondary | ICD-10-CM

## 2016-09-12 NOTE — Therapy (Signed)
[facility] [address] [place], [place], [address] Phone: [phone]   Fax:  [phone]  Physical Therapy Treatment  Patient Details  Name: Sara Garza MRN: [id number] Date of Birth: 1923-10-26 Referring Provider: Dr. Legrand Como Rigby/Nafziger, [name], NP  Encounter Date: 09/12/2016      PT End of Session - 09/12/16 1058    Visit Number 7   Number of Visits 12   Date for PT Re-Evaluation 10/03/16   Authorization Type Medicare - G Codes and Progress Notes   PT Start Time 2585   PT Stop Time 2778   PT Time Calculation (min) 43 min   Activity Tolerance Patient tolerated treatment well;No increased pain   Behavior During Therapy WFL for tasks assessed/performed      Past Medical History:  Diagnosis Date  . Arthritis   . CAD (coronary artery disease)   . Carotid stenosis, left    50-60% stable  . Diabetes mellitus   . Glaucoma   . Hyperlipidemia   . Hypertension   . Leg pain    ABIs 2/18: normal bilaterally.  . Osteoporosis     Past Surgical History:  Procedure Laterality Date  . ANOMALOUS PULMONARY VENOUS RETURN REPAIR, TOTAL    . APPENDECTOMY    . CARDIAC VALVE REPLACEMENT  04/29/2012  . Cataracts Bilateral   . CESAREAN SECTION    . FOOT SURGERY     [name]  . HIP ARTHROPLASTY Right 07/31/2013   Procedure: ARTHROPLASTY BIPOLAR HIP;  Surgeon: [name], MD;  Location: [facility];  Service: Orthopedics;  Laterality: Right;  . PERCUTANEOUS CORONARY STENT INTERVENTION (PCI-S) N/A 01/02/2012   Procedure: PERCUTANEOUS CORONARY STENT INTERVENTION (PCI-S);  Surgeon: Sherren Mocha, MD;  Location: [facility] CATH LAB;  Service: Cardiovascular;  Laterality: N/A;    There were no vitals filed for this visit.      Subjective Assessment - 09/12/16 1013    Subjective still stiff in the mornings but feels like it takes less time to get moving.     Pertinent History Rt hip hemiarthroplasty 2015, OA, CAD, DM, HLD, HTN, osteoporosis    How long can you walk comfortably? 4-5 min   Patient Stated Goals be more comfortable and walk with decreased pain; fearful of falling and c/o LE weakness   Currently in Pain? Yes   Pain Score 3    Pain Location Buttocks   Pain Orientation Left;Right   Pain Descriptors / Indicators Aching   Pain Type Chronic pain   Pain Radiating Towards into thighs   Pain Onset More than a month ago   Pain Frequency Constant   Aggravating Factors  after sititng or lying down   Pain Relieving Factors tylenol                         [facility] Adult PT Treatment/Exercise - 09/12/16 1022      Transfers   Five time sit to stand comments  30.57 sec without UE support     Standardized Balance Assessment   Standardized Balance Assessment Timed Up and Go Test     Timed Up and Go Test   TUG Normal TUG   Normal TUG (seconds) 17.53  with SPC     Lumbar Exercises: Stretches   Single Knee to Chest Stretch 3 reps;30 seconds   Single Knee to Chest Stretch Limitations bil   Lower Trunk Rotation 3 reps;30 seconds   Lower Trunk Rotation Limitations bil  Lumbar Exercises: Aerobic   Stationary Bike L1 x 7 min     Lumbar Exercises: Supine   Bridge 10 reps;5 seconds   Other Supine Lumbar Exercises hooklying single limb clamshell x10 bil with red theraband                     PT Long Term Goals - 09/12/16 1154      PT LONG TERM GOAL #1   Title independent with HEP (10/03/16)   Baseline 09/12/16: min cues today; need to add standing exercises to HEP   Status On-going     PT LONG TERM GOAL #2   Title report ability to walk for > 10 min without increase in pain for improved functional mobility (10/03/16)   Baseline 09/12/16: 4-5 min only   Status On-going     PT LONG TERM GOAL #3   Title demonstrate 4/5 bil hip strength for improved functional mobility (10/03/16)   Status On-going     PT LONG TERM GOAL #4   Title perform sit to/from stand without UE support at least 5 times for  improved functional strength (10/03/16)   Baseline 09/12/16: met today   Status Achieved     PT LONG TERM GOAL #5   Title formal balance goals to follow if indicated    Baseline 09/12/16: 2 additional goals added   Status Achieved     PT LONG TERM GOAL #6   Title improve timed up and go to < 14 sec for imroved mobility and decreased fall risk (10/03/16)   Time 3   Period Weeks   Status New     PT LONG TERM GOAL #7   Title perform 5x STS in < 25 sec for improved functional strength and mobility (10/03/16)   Time 3   Period Weeks   Status New               Plan - 09/12/16 1157    Clinical Impression Statement Pt continues to report stiffness and pain but does feel it takes less time in the AM to "loosen up."  Pt has met LTGs #4 and 5 with additional balance and functional strength goals added today.  Pt overall seems to demonstrate improved functional mobility despite self reports of minimal improvement.  Plan to add standing balance and strengthening exercises to HEP next session.   PT Treatment/Interventions ADLs/Self Care Home Management;Cryotherapy;Electrical Stimulation;Moist Heat;Ultrasound;Neuromuscular re-education;Therapeutic exercise;Balance training;Therapeutic activities;Functional mobility training;Patient/family education;Gait training;Manual techniques;Taping;Dry needling   PT Next Visit Plan add standing exercises to HEP, continue endurance/functional mobiltiy and strengthening   Consulted and Agree with Plan of Care Patient      Patient will benefit from skilled therapeutic intervention in order to improve the following deficits and impairments:  Abnormal gait, Decreased balance, Difficulty walking, Decreased mobility, Decreased strength, Pain, Increased fascial restricitons, Postural dysfunction, Impaired flexibility, Decreased activity tolerance  Visit Diagnosis: Chronic bilateral low back pain without sciatica  Muscle weakness (generalized)  Abnormal  posture  Other abnormalities of gait and mobility     Problem List Patient Active Problem List   Diagnosis Date Noted  . Chronic bilateral low back pain 08/12/2016  . Spondylosis of lumbar joint 08/12/2016  . Bilateral leg pain 08/12/2016  . Neck mass 04/15/2015  . Status post hip hemiarthroplasty 07/31/2013  . CAD (coronary artery disease) 07/31/2013  . CKD (chronic kidney disease), stage III 07/31/2013  . Left bundle branch block 07/31/2013  . Severe calcific aortic stenosis 06/18/2010  .  Diabetic polyneuropathy ([place]) 04/16/2010  . GLAUCOMA 01/06/2009  . CHEST PAIN, ATYPICAL 12/16/2008  . SHOULDER PAIN, RIGHT 11/16/2007  . OSTEOARTHRITIS 02/02/2007  . Type 2 diabetes mellitus with renal manifestations, controlled ([place]) 10/24/2006  . Dyslipidemia 10/02/2006  . Essential hypertension 10/02/2006      Laureen Abrahams, PT, DPT 09/12/16 12:01 PM    [facility] [place], [place], [address] Phone: [phone]   Fax:  [phone]  Name: Sara Garza MRN: [id number] Date of Birth: 05/15/1923

## 2016-09-16 ENCOUNTER — Ambulatory Visit (INDEPENDENT_AMBULATORY_CARE_PROVIDER_SITE_OTHER): Payer: Medicare Other | Admitting: Physical Therapy

## 2016-09-16 DIAGNOSIS — R293 Abnormal posture: Secondary | ICD-10-CM | POA: Diagnosis not present

## 2016-09-16 DIAGNOSIS — R2689 Other abnormalities of gait and mobility: Secondary | ICD-10-CM | POA: Diagnosis not present

## 2016-09-16 DIAGNOSIS — G8929 Other chronic pain: Secondary | ICD-10-CM

## 2016-09-16 DIAGNOSIS — M545 Low back pain, unspecified: Secondary | ICD-10-CM

## 2016-09-16 DIAGNOSIS — M6281 Muscle weakness (generalized): Secondary | ICD-10-CM

## 2016-09-16 NOTE — Patient Instructions (Signed)
Standing Marching   Using a chair if necessary, march in place. Repeat _10___ times on each side. Do _1-2___ sessions per day.  http://gt2.exer.us/344   Copyright  VHI. All rights reserved.   Hip Backward Kick   Using a chair for balance, keep legs shoulder width apart and toes pointed for- ward. Slowly extend one leg back, keeping knee straight. Do not lean forward. Repeat with other leg. Repeat _10___ times on each side. Do _1-2___ sessions per day.  http://[email or web address].exer.us/340   Copyright  VHI. All rights reserved.   Hip Side Kick   Holding a chair for balance, keep legs shoulder width apart and toes pointed forward. Swing a leg out to side, keeping knee straight. Do not lean. Repeat using other leg. Repeat __10__ times on each leg. Do __1-2__ sessions per day.  http://[email or web address].exer.us/342   Copyright  VHI. All rights reserved.   Toe Up    Gently rise up on toes, then roll back on heels. Repeat _20___ times in each direction. Do __1-2__ sessions per day.  http://[email or web address].exer.us/475   Copyright  VHI. All rights reserved.

## 2016-09-16 NOTE — Therapy (Signed)
[facility] [address] [place], [place], [address] Phone: [phone]   Fax:  [phone]  Physical Therapy Treatment  Patient Details  Name: Sara Garza MRN: [id number] Date of Birth: 1923/12/28 Referring Provider: Dr. Legrand Como Rigby/Nafziger, [name], NP  Encounter Date: 09/16/2016      PT End of Session - 09/16/16 1102    Visit Number 8   Number of Visits 12   Date for PT Re-Evaluation 10/03/16   Authorization Type Medicare - G Codes and Progress Notes   PT Start Time 1020   PT Stop Time 1100   PT Time Calculation (min) 40 min   Activity Tolerance Patient tolerated treatment well;Treatment limited secondary to medical complications (Comment)  pt nauseous   Behavior During Therapy [facility] for tasks assessed/performed      Past Medical History:  Diagnosis Date  . Arthritis   . CAD (coronary artery disease)   . Carotid stenosis, left    50-60% stable  . Diabetes mellitus   . Glaucoma   . Hyperlipidemia   . Hypertension   . Leg pain    ABIs 2/18: normal bilaterally.  . Osteoporosis     Past Surgical History:  Procedure Laterality Date  . ANOMALOUS PULMONARY VENOUS RETURN REPAIR, TOTAL    . APPENDECTOMY    . CARDIAC VALVE REPLACEMENT  04/29/2012  . Cataracts Bilateral   . CESAREAN SECTION    . FOOT SURGERY     [name]  . HIP ARTHROPLASTY Right 07/31/2013   Procedure: ARTHROPLASTY BIPOLAR HIP;  Surgeon: [name], MD;  Location: [facility];  Service: Orthopedics;  Laterality: Right;  . PERCUTANEOUS CORONARY STENT INTERVENTION (PCI-S) N/A 01/02/2012   Procedure: PERCUTANEOUS CORONARY STENT INTERVENTION (PCI-S);  Surgeon: Sherren Mocha, MD;  Location: [facility] CATH LAB;  Service: Cardiovascular;  Laterality: N/A;    There were no vitals filed for this visit.      Subjective Assessment - 09/16/16 1021    Subjective doing well today; and yesterday was a "great day"   Pertinent History Rt hip hemiarthroplasty 2015, OA, CAD,  DM, HLD, HTN, osteoporosis   Patient Stated Goals be more comfortable and walk with decreased pain; fearful of falling and c/o LE weakness   Currently in Pain? Yes   Pain Score 3    Pain Location Buttocks   Pain Orientation Left;Right   Pain Descriptors / Indicators Aching   Pain Type Chronic pain   Pain Onset More than a month ago   Pain Frequency Constant   Aggravating Factors  after sitting or lying down   Pain Relieving Factors tylenol                         [facility] Adult PT Treatment/Exercise - 09/16/16 1029      Lumbar Exercises: Aerobic   Stationary Bike L1 x 8 min     Knee/Hip Exercises: Standing   Heel Raises Both;20 reps   Heel Raises Limitations toe raises x 20 reps   Hip Flexion Both;10 reps;Knee bent   Hip Abduction Both;10 reps;Knee straight   Hip Extension Both;10 reps;Knee straight                PT Education - 09/16/16 1102    Education provided Yes   Education Details standing HEP   Person(s) Educated Patient   Methods Explanation;Demonstration;Handout   Comprehension Verbalized understanding;Returned demonstration;Need further instruction  PT Long Term Goals - 09/12/16 1154      PT LONG TERM GOAL #1   Title independent with HEP (10/03/16)   Baseline 09/12/16: min cues today; need to add standing exercises to HEP   Status On-going     PT LONG TERM GOAL #2   Title report ability to walk for > 10 min without increase in pain for improved functional mobility (10/03/16)   Baseline 09/12/16: 4-5 min only   Status On-going     PT LONG TERM GOAL #3   Title demonstrate 4/5 bil hip strength for improved functional mobility (10/03/16)   Status On-going     PT LONG TERM GOAL #4   Title perform sit to/from stand without UE support at least 5 times for improved functional strength (10/03/16)   Baseline 09/12/16: met today   Status Achieved     PT LONG TERM GOAL #5   Title formal balance goals to follow if indicated     Baseline 09/12/16: 2 additional goals added   Status Achieved     PT LONG TERM GOAL #6   Title improve timed up and go to < 14 sec for imroved mobility and decreased fall risk (10/03/16)   Time 3   Period Weeks   Status New     PT LONG TERM GOAL #7   Title perform 5x STS in < 25 sec for improved functional strength and mobility (10/03/16)   Time 3   Period Weeks   Status New               Plan - 09/16/16 1103    Clinical Impression Statement Pt reports overall she feels pain is improving and stiffness is better in the AM.  Added standing exercises to HEP today after warm up on bike.  Pt sat down following standing exercises and reported to PT for first time that she was feeling nauseous.  Asked how long she had been nauseous pt stated "since I left to come over here."  Pt requested to sit and rest so discussed current progress and POC.  POC until 7/5 and given pt is showing progress recommend PT 2x/wk x 2 additional weeks through POC to maximize function and mobility.  Pt will continue to benefit from PT to maximize function.   PT Treatment/Interventions ADLs/Self Care Home Management;Cryotherapy;Electrical Stimulation;Moist Heat;Ultrasound;Neuromuscular re-education;Therapeutic exercise;Balance training;Therapeutic activities;Functional mobility training;Patient/family education;Gait training;Manual techniques;Taping;Dry needling   PT Next Visit Plan review standing HEP, continue endurance/functional moiblity and strengthening   Consulted and Agree with Plan of Care Patient      Patient will benefit from skilled therapeutic intervention in order to improve the following deficits and impairments:  Abnormal gait, Decreased balance, Difficulty walking, Decreased mobility, Decreased strength, Pain, Increased fascial restricitons, Postural dysfunction, Impaired flexibility, Decreased activity tolerance  Visit Diagnosis: Chronic bilateral low back pain without sciatica  Muscle weakness  (generalized)  Abnormal posture  Other abnormalities of gait and mobility     Problem List Patient Active Problem List   Diagnosis Date Noted  . Chronic bilateral low back pain 08/12/2016  . Spondylosis of lumbar joint 08/12/2016  . Bilateral leg pain 08/12/2016  . Neck mass 04/15/2015  . Status post hip hemiarthroplasty 07/31/2013  . CAD (coronary artery disease) 07/31/2013  . CKD (chronic kidney disease), stage III 07/31/2013  . Left bundle branch block 07/31/2013  . Severe calcific aortic stenosis 06/18/2010  . Diabetic polyneuropathy ([place]) 04/16/2010  . GLAUCOMA 01/06/2009  . CHEST PAIN, ATYPICAL 12/16/2008  .  SHOULDER PAIN, RIGHT 11/16/2007  . OSTEOARTHRITIS 02/02/2007  . Type 2 diabetes mellitus with renal manifestations, controlled ([place]) 10/24/2006  . Dyslipidemia 10/02/2006  . Essential hypertension 10/02/2006      Laureen Abrahams, PT, DPT 09/16/16 11:09 AM    [facility] [address] [place], [place], [address] Phone: [phone]   Fax:  [phone]  Name: Sara Garza MRN: [id number] Date of Birth: 04/15/1923

## 2016-09-17 ENCOUNTER — Ambulatory Visit (INDEPENDENT_AMBULATORY_CARE_PROVIDER_SITE_OTHER): Payer: Medicare Other | Admitting: Family Medicine

## 2016-09-17 ENCOUNTER — Encounter: Payer: Self-pay | Admitting: Family Medicine

## 2016-09-17 VITALS — BP 120/68 | HR 75 | Temp 98.4°F | Ht 60.0 in | Wt 167.6 lb

## 2016-09-17 DIAGNOSIS — R32 Unspecified urinary incontinence: Secondary | ICD-10-CM | POA: Diagnosis not present

## 2016-09-17 DIAGNOSIS — I208 Other forms of angina pectoris: Secondary | ICD-10-CM

## 2016-09-17 DIAGNOSIS — N3000 Acute cystitis without hematuria: Secondary | ICD-10-CM | POA: Diagnosis not present

## 2016-09-17 LAB — URINALYSIS, ROUTINE W REFLEX MICROSCOPIC
Bilirubin Urine: NEGATIVE
Hgb urine dipstick: NEGATIVE
Ketones, ur: 15 — AB
Nitrite: NEGATIVE
Specific Gravity, Urine: 1.025 (ref 1.000–1.030)
Total Protein, Urine: 30 — AB
Urine Glucose: NEGATIVE
Urobilinogen, UA: 0.2 (ref 0.0–1.0)
pH: 5 (ref 5.0–8.0)

## 2016-09-17 NOTE — Progress Notes (Signed)
Sara Garza is a 83 y.o. female here for an acute visit.  History of Present Illness:   Water quality scientist, CMA, acting as scribe for Dr. Juleen Garza.  CC:  Patient states she has been having urinary incontinence for 6 months or more.  She has not tried any medications for this.  No UTI symptoms.  No pain.  States she was put on Lasix but doesn't take it like she should because it causes worsening of her incontinence.    HPI:  Urinary Incontinence Patient complains of urinary incontinence. This has been present for 6 months. She leaks urine with standing, walking, during the night. Patient describes the symptoms as sensation of incomplete emptying of bladder, urge to urinate with little or no warning and urine leaking unpredictably. Factors associated with symptoms include medications. Evaluation to date includes none. Treatment to date includes none.  PMHx, SurgHx, SocialHx, Medications, and Allergies were reviewed in the Visit Navigator and updated as appropriate.  Current Medications:   Current Outpatient Prescriptions:  .  amLODipine (NORVASC) 5 MG tablet, TAKE 1 TABLET(5 MG) BY MOUTH DAILY, Disp: 90 tablet, Rfl: 0 .  aspirin EC 81 MG tablet, Take 1 tablet (81 mg total) by mouth daily. Start after lovenox injections are done after 2 weeks, Disp: , Rfl:  .  bimatoprost (LUMIGAN) 0.01 % SOLN, Place 1 drop into both eyes at bedtime. , Disp: , Rfl:  .  Calcium Carb-Cholecalciferol (CALCIUM 500 +D) 500-400 MG-UNIT TABS, Take 1 tablet by mouth daily. , Disp: , Rfl:  .  carvedilol (COREG) 3.125 MG tablet, Take 1 tablet (3.125 mg total) by mouth 2 (two) times daily with a meal., Disp: 60 tablet, Rfl: 3 .  ezetimibe (ZETIA) 10 MG tablet, Take 1 tablet (10 mg total) by mouth daily., Disp: 30 tablet, Rfl: 11 .  furosemide (LASIX) 20 MG tablet, Take 0.5 tablets (10 mg total) by mouth daily., Disp: 45 tablet, Rfl: 3 .  isosorbide mononitrate (IMDUR) 60 MG 24 hr tablet, Take 1 tablet (60 mg total) by  mouth daily., Disp: 30 tablet, Rfl: 11 .  Multiple Vitamin (MULTIVITAMIN) tablet, Take 1 tablet by mouth daily.  , Disp: , Rfl:    Allergies  Allergen Reactions  . Statins Other (See Comments)    Muscle aches  . Gabapentin Other (See Comments)    SOMNOLENCE   Review of Systems:   Review of Systems  Constitutional: Negative for fever.  HENT: Negative for congestion, ear pain and sore throat.   Eyes: Negative for blurred vision and pain.  Respiratory: Negative for cough and shortness of breath.   Cardiovascular: Negative for chest pain and palpitations.  Gastrointestinal: Negative for abdominal pain.  Genitourinary:       Incontinence.  Musculoskeletal: Negative for back pain and neck pain.  Skin: Negative for rash.  Neurological: Negative for dizziness, loss of consciousness, weakness and headaches.  Endo/Heme/Allergies: Does not bruise/bleed easily.  Psychiatric/Behavioral: Negative for depression. The patient is not nervous/anxious.    Vitals:   Vitals:   09/17/16 1139  BP: 120/68  Pulse: 75  Temp: 98.4 F (36.9 C)  TempSrc: Oral  SpO2: 94%  Weight: 167 lb 9.6 oz (76 kg)  Height: 5' (1.524 m)     Body mass index is 32.73 kg/m.  Physical Exam:   Physical Exam  Constitutional: She appears well-developed and well-nourished. No distress.  HENT:  Head: Normocephalic and atraumatic.  Eyes: EOM are normal. Pupils are equal, round, and reactive to light.  Neck: Normal range of motion. Neck supple.  Cardiovascular: Normal rate, regular rhythm and intact distal pulses.   Pulmonary/Chest: Effort normal.  Abdominal: Soft.  Skin: Skin is warm.  Psychiatric: She has a normal mood and affect. Her behavior is normal.  Nursing note and vitals reviewed.   Results for orders placed or performed in visit on 09/17/16  Urine Culture  Result Value Ref Range   Culture PROTEUS MIRABILIS    Colony Count Greater than 100,000 CFU/mL    Organism ID, Bacteria PROTEUS MIRABILIS        Susceptibility   Proteus mirabilis -  (no method available)    AMPICILLIN <=2 Sensitive     AMOX/CLAVULANIC <=2 Sensitive     AMPICILLIN/SULBACTAM <=2 Sensitive     PIP/TAZO <=4 Sensitive     CEFAZOLIN <=4 Not Reportable     CEFTRIAXONE <=1 Sensitive     CEFTAZIDIME <=1 Sensitive     CEFEPIME <=1 Sensitive     GENTAMICIN <=1 Sensitive     TOBRAMYCIN <=1 Sensitive     CIPROFLOXACIN <=0.25 Sensitive     LEVOFLOXACIN <=0.12 Sensitive     NITROFURANTOIN 128 Resistant     TRIMETH/SULFA* <=20 Sensitive      * NR=NOT REPORTABLE,SEE COMMENTORAL therapy:A cefazolin MIC of <32 predicts susceptibility to the oral agents cefaclor,cefdinir,cefpodoxime,cefprozil,cefuroxime,cephalexin,and loracarbef when used for therapy of uncomplicated UTIs due to E.coli,K.pneumomiae,and P.mirabilis. PARENTERAL therapy: A cefazolinMIC of >8 indicates resistance to parenteralcefazolin. An alternate test method must beperformed to confirm susceptibility to parenteralcefazolin.  Urinalysis, Routine w reflex microscopic  Result Value Ref Range   Color, Urine YELLOW Yellow;Lt. Yellow   APPearance Sl Cloudy (A) Clear   Specific Gravity, Urine 1.025 1.000 - 1.030   pH 5.0 5.0 - 8.0   Total Protein, Urine 30 (A) Negative   Urine Glucose NEGATIVE Negative   Ketones, ur 15 (A) Negative   Bilirubin Urine NEGATIVE Negative   Hgb urine dipstick NEGATIVE Negative   Urobilinogen, UA 0.2 0.0 - 1.0   Leukocytes, UA TRACE (A) Negative   Nitrite NEGATIVE Negative   WBC, UA 0-2/hpf 0-2/hpf   RBC / HPF 0-2/hpf 0-2/hpf   Mucus, UA Presence of (A) None   Squamous Epithelial / LPF Rare(0-4/hpf) Rare(0-4/hpf)   Hyaline Casts, UA Presence of (A) None    Assessment and Plan:   Sara Garza was seen today for acute visit.  Diagnoses and all orders for this visit:  Urinary incontinence, unspecified type Comments: Mixed picture. On Lasix. UA with UCx completed at time of visit - Rx sent in with positive culture. To URO (wants female  physician). Hold on medication. Orders: -     Urinalysis, Routine w reflex microscopic -     Urine Culture -     Ambulatory referral to Urology  Acute cystitis without hematuria Comments: Proteus. Orders: -     cephALEXin (KEFLEX) 500 MG capsule; Take 1 capsule (500 mg total) by mouth 3 (three) times daily.    . Reviewed expectations re: course of current medical issues. . Discussed self-management of symptoms. . Outlined signs and symptoms indicating need for more acute intervention. . Patient verbalized understanding and all questions were answered. [name] Health Maintenance issues including appropriate healthy diet, exercise, and smoking avoidance were discussed with patient. . See orders for this visit as documented in the electronic medical record. . Patient received an After Visit Summary.  CMA served as Education administrator during this visit. History, Physical, and Plan performed by medical provider. The above documentation has  been reviewed and is accurate and complete. Briscoe Deutscher, D.O.  Briscoe Deutscher, DO [place] [place] 09/22/2016  Future Appointments Date Time Provider [place]  09/24/2016 10:15 AM HORSE PEN SUB THERAPIST A [facility] None  09/27/2016 9:30 AM Gerda Diss, DO [facility] None  09/27/2016 10:15 AM HORSE PEN SUB THERAPIST A [facility] None  10/01/2016 10:15 AM HORSE PEN SUB THERAPIST A [facility] None  10/04/2016 10:15 AM HORSE PEN SUB THERAPIST A [facility] None

## 2016-09-18 ENCOUNTER — Telehealth: Payer: Self-pay | Admitting: Family Medicine

## 2016-09-18 ENCOUNTER — Other Ambulatory Visit: Payer: Self-pay

## 2016-09-18 DIAGNOSIS — R32 Unspecified urinary incontinence: Secondary | ICD-10-CM

## 2016-09-18 NOTE — Telephone Encounter (Signed)
Patient requests a call from [employer] about the doctor she discussed with Dr. Juleen China and Luetta Nutting. Please call patient back to advise.

## 2016-09-18 NOTE — Telephone Encounter (Signed)
LM to return call.  Patient is being referred to Uro-Gynecology for incontinence.  Patient doesn't want a female physician.  We are assuring she will be referred to a female provider.  She will be contacted to schedule the appointment.

## 2016-09-18 NOTE — Telephone Encounter (Signed)
Patient returning phone call. I advised her of the note below. No further action required. Patient had no further questions.

## 2016-09-18 NOTE — Telephone Encounter (Signed)
Noted  

## 2016-09-19 ENCOUNTER — Ambulatory Visit (INDEPENDENT_AMBULATORY_CARE_PROVIDER_SITE_OTHER): Payer: Medicare Other | Admitting: Physical Therapy

## 2016-09-19 DIAGNOSIS — M545 Low back pain: Secondary | ICD-10-CM

## 2016-09-19 DIAGNOSIS — R2689 Other abnormalities of gait and mobility: Secondary | ICD-10-CM | POA: Diagnosis not present

## 2016-09-19 DIAGNOSIS — M6281 Muscle weakness (generalized): Secondary | ICD-10-CM

## 2016-09-19 DIAGNOSIS — G8929 Other chronic pain: Secondary | ICD-10-CM

## 2016-09-19 DIAGNOSIS — R293 Abnormal posture: Secondary | ICD-10-CM | POA: Diagnosis not present

## 2016-09-19 LAB — URINE CULTURE

## 2016-09-19 NOTE — Therapy (Signed)
[facility] [address] [place], [place], [address] Phone: [phone]   Fax:  [phone]  Physical Therapy Treatment  Patient Details  Name: Sara Garza MRN: [id number] Date of Birth: May 13, 1923 Referring Provider: Dr. Legrand Como Rigby/Nafziger, [name], NP  Encounter Date: 09/19/2016      PT End of Session - 09/19/16 1056    Visit Number 9   Number of Visits 12   Date for PT Re-Evaluation 10/03/16   Authorization Type Medicare - G Codes and Progress Notes   PT Start Time 1015   PT Stop Time 1055   PT Time Calculation (min) 40 min   Activity Tolerance Patient tolerated treatment well   Behavior During Therapy [facility] for tasks assessed/performed      Past Medical History:  Diagnosis Date  . Arthritis   . CAD (coronary artery disease)   . Carotid stenosis, left    50-60% stable  . Diabetes mellitus   . Glaucoma   . Hyperlipidemia   . Hypertension   . Leg pain    ABIs 2/18: normal bilaterally.  . Osteoporosis     Past Surgical History:  Procedure Laterality Date  . ANOMALOUS PULMONARY VENOUS RETURN REPAIR, TOTAL    . APPENDECTOMY    . CARDIAC VALVE REPLACEMENT  04/29/2012  . Cataracts Bilateral   . CESAREAN SECTION    . FOOT SURGERY     [name]  . HIP ARTHROPLASTY Right 07/31/2013   Procedure: ARTHROPLASTY BIPOLAR HIP;  Surgeon: [name], MD;  Location: [facility];  Service: Orthopedics;  Laterality: Right;  . PERCUTANEOUS CORONARY STENT INTERVENTION (PCI-S) N/A 01/02/2012   Procedure: PERCUTANEOUS CORONARY STENT INTERVENTION (PCI-S);  Surgeon: Sherren Mocha, MD;  Location: [facility] CATH LAB;  Service: Cardiovascular;  Laterality: N/A;    There were no vitals filed for this visit.      Subjective Assessment - 09/19/16 1021    Subjective Doctor's visit went well; being referred to a specialist.     Pertinent History Rt hip hemiarthroplasty 2015, OA, CAD, DM, HLD, HTN, osteoporosis   Patient Stated Goals be more  comfortable and walk with decreased pain; fearful of falling and c/o LE weakness   Currently in Pain? Yes   Pain Score 2    Pain Location Buttocks   Pain Orientation Left;Right   Pain Descriptors / Indicators Aching   Pain Type Chronic pain   Pain Onset More than a month ago   Pain Frequency Constant   Aggravating Factors  after sitting or lying down   Pain Relieving Factors tylenol                         [facility] Adult PT Treatment/Exercise - 09/19/16 1022      Ambulation/Gait   Gait Comments gait for endurance: 5 min x 410'; 4:05 x 325'     Lumbar Exercises: Aerobic   Stationary Bike L1 x 8 min     Knee/Hip Exercises: Seated   Long Arc Quad Both;15 reps   Long Arc Quad Weight 3 lbs.   Marching Both;15 reps;Weights   Marching Weights 3 lbs.   Sit to Sand 10 reps;without UE support                     PT Long Term Goals - 09/12/16 1154      PT LONG TERM GOAL #1   Title independent with HEP (10/03/16)   Baseline 09/12/16: min cues today;  need to add standing exercises to HEP   Status On-going     PT LONG TERM GOAL #2   Title report ability to walk for > 10 min without increase in pain for improved functional mobility (10/03/16)   Baseline 09/12/16: 4-5 min only   Status On-going     PT LONG TERM GOAL #3   Title demonstrate 4/5 bil hip strength for improved functional mobility (10/03/16)   Status On-going     PT LONG TERM GOAL #4   Title perform sit to/from stand without UE support at least 5 times for improved functional strength (10/03/16)   Baseline 09/12/16: met today   Status Achieved     PT LONG TERM GOAL #5   Title formal balance goals to follow if indicated    Baseline 09/12/16: 2 additional goals added   Status Achieved     PT LONG TERM GOAL #6   Title improve timed up and go to < 14 sec for imroved mobility and decreased fall risk (10/03/16)   Time 3   Period Weeks   Status New     PT LONG TERM GOAL #7   Title perform 5x STS in < 25  sec for improved functional strength and mobility (10/03/16)   Time 3   Period Weeks   Status New               Plan - 09/19/16 1145    Clinical Impression Statement Pt continues to increase amb distance and time, and reports need to stop is due to fatigue rather than pain.  Overall activity continues to increase with reports of decreased frequency of pain.  Continues to progress well with PT.     PT Treatment/Interventions ADLs/Self Care Home Management;Cryotherapy;Electrical Stimulation;Moist Heat;Ultrasound;Neuromuscular re-education;Therapeutic exercise;Balance training;Therapeutic activities;Functional mobility training;Patient/family education;Gait training;Manual techniques;Taping;Dry needling   PT Next Visit Plan review standing HEP, continue endurance/functional moiblity and strengthening   Consulted and Agree with Plan of Care Patient      Patient will benefit from skilled therapeutic intervention in order to improve the following deficits and impairments:  Abnormal gait, Decreased balance, Difficulty walking, Decreased mobility, Decreased strength, Pain, Increased fascial restricitons, Postural dysfunction, Impaired flexibility, Decreased activity tolerance  Visit Diagnosis: Chronic bilateral low back pain without sciatica  Muscle weakness (generalized)  Abnormal posture  Other abnormalities of gait and mobility     Problem List Patient Active Problem List   Diagnosis Date Noted  . Chronic bilateral low back pain 08/12/2016  . Spondylosis of lumbar joint 08/12/2016  . Bilateral leg pain 08/12/2016  . Neck mass 04/15/2015  . Status post hip hemiarthroplasty 07/31/2013  . CAD (coronary artery disease) 07/31/2013  . CKD (chronic kidney disease), stage III 07/31/2013  . Left bundle branch block 07/31/2013  . Severe calcific aortic stenosis 06/18/2010  . Diabetic polyneuropathy ([place]) 04/16/2010  . GLAUCOMA 01/06/2009  . CHEST PAIN, ATYPICAL 12/16/2008  .  SHOULDER PAIN, RIGHT 11/16/2007  . OSTEOARTHRITIS 02/02/2007  . Type 2 diabetes mellitus with renal manifestations, controlled ([place]) 10/24/2006  . Dyslipidemia 10/02/2006  . Essential hypertension 10/02/2006      Laureen Abrahams, PT, DPT 09/19/16 11:48 AM    [facility] [address] [place], [place], [address] Phone: [phone]   Fax:  [phone]  Name: MEGHNA HAGMANN MRN: [id number] Date of Birth: 24-Jul-1923

## 2016-09-20 MED ORDER — CEPHALEXIN 500 MG PO CAPS: 500.0000 mg | ORAL_CAPSULE | Freq: Three times a day (TID) | ORAL | 0 refills | Status: DC

## 2016-09-23 ENCOUNTER — Ambulatory Visit: Payer: Medicare Other | Admitting: Sports Medicine

## 2016-09-24 ENCOUNTER — Ambulatory Visit (INDEPENDENT_AMBULATORY_CARE_PROVIDER_SITE_OTHER): Payer: Medicare Other | Admitting: Physical Therapy

## 2016-09-24 DIAGNOSIS — R293 Abnormal posture: Secondary | ICD-10-CM | POA: Diagnosis not present

## 2016-09-24 DIAGNOSIS — M6281 Muscle weakness (generalized): Secondary | ICD-10-CM

## 2016-09-24 DIAGNOSIS — R2689 Other abnormalities of gait and mobility: Secondary | ICD-10-CM | POA: Diagnosis not present

## 2016-09-24 DIAGNOSIS — G8929 Other chronic pain: Secondary | ICD-10-CM | POA: Diagnosis not present

## 2016-09-24 DIAGNOSIS — M545 Low back pain, unspecified: Secondary | ICD-10-CM

## 2016-09-24 NOTE — Therapy (Signed)
[facility] [address] [place], [place], [address] Phone: [phone]   Fax:  [phone]  Physical Therapy Treatment  Patient Details  Name: Sara Garza MRN: [id number] Date of Birth: 1924/01/14 Referring Provider: Dr. Legrand Como Rigby/Nafziger, [name], NP  Encounter Date: 09/24/2016      PT End of Session - 09/24/16 1107    Visit Number 10   Number of Visits 12   Date for PT Re-Evaluation 10/03/16   Authorization Type Medicare - G Codes and Progress Notes   PT Start Time 1018   PT Stop Time 1058   PT Time Calculation (min) 40 min   Activity Tolerance Patient tolerated treatment well   Behavior During Therapy [facility] for tasks assessed/performed      Past Medical History:  Diagnosis Date  . Arthritis   . CAD (coronary artery disease)   . Carotid stenosis, left    50-60% stable  . Diabetes mellitus   . Glaucoma   . Hyperlipidemia   . Hypertension   . Leg pain    ABIs 2/18: normal bilaterally.  . Osteoporosis     Past Surgical History:  Procedure Laterality Date  . ANOMALOUS PULMONARY VENOUS RETURN REPAIR, TOTAL    . APPENDECTOMY    . CARDIAC VALVE REPLACEMENT  04/29/2012  . Cataracts Bilateral   . CESAREAN SECTION    . FOOT SURGERY     [name]  . HIP ARTHROPLASTY Right 07/31/2013   Procedure: ARTHROPLASTY BIPOLAR HIP;  Surgeon: [name], MD;  Location: [facility];  Service: Orthopedics;  Laterality: Right;  . PERCUTANEOUS CORONARY STENT INTERVENTION (PCI-S) N/A 01/02/2012   Procedure: PERCUTANEOUS CORONARY STENT INTERVENTION (PCI-S);  Surgeon: Sherren Mocha, MD;  Location: [facility] CATH LAB;  Service: Cardiovascular;  Laterality: N/A;    There were no vitals filed for this visit.      Subjective Assessment - 09/24/16 1022    Subjective not doing well; almost canceled today due to back pain.   Pertinent History Rt hip hemiarthroplasty 2015, OA, CAD, DM, HLD, HTN, osteoporosis   Patient Stated Goals be more comfortable  and walk with decreased pain; fearful of falling and c/o LE weakness   Currently in Pain? Yes   Pain Score 6    Pain Location Back   Pain Orientation Left   Pain Descriptors / Indicators Stabbing   Pain Type Chronic pain   Pain Radiating Towards isolated to one area   Pain Onset More than a month ago   Pain Frequency Constant   Aggravating Factors  constant   Pain Relieving Factors tylenol                         [facility] Adult PT Treatment/Exercise - 09/24/16 1030      Ambulation/Gait   Gait Comments gait for endurance: 240' in 325'     Lumbar Exercises: Aerobic   Stationary Bike L1 x 6 min     Lumbar Exercises: Supine   Bridge 10 reps;5 seconds     Knee/Hip Exercises: Seated   Long Arc Quad Both;2 sets;10 reps;Weights   Long Arc Quad Weight 3 lbs.   Marching Both;2 sets;10 reps;Weights   Marching Weights 3 lbs.   Sit to Sand 10 reps;without UE support     Knee/Hip Exercises: Supine   Straight Leg Raises Both;10 reps                     PT Long  Term Goals - 10/08/2016 1107      PT LONG TERM GOAL #1   Title independent with HEP (10/03/16)   Status On-going     PT LONG TERM GOAL #2   Title report ability to walk for > 10 min without increase in pain for improved functional mobility (10/03/16)   Status On-going     PT LONG TERM GOAL #3   Title demonstrate 4/5 bil hip strength for improved functional mobility (10/03/16)   Status On-going     PT LONG TERM GOAL #4   Title perform sit to/from stand without UE support at least 5 times for improved functional strength (10/03/16)   Status Achieved     PT LONG TERM GOAL #5   Title formal balance goals to follow if indicated    Status Achieved     PT LONG TERM GOAL #6   Title improve timed up and go to < 14 sec for imroved mobility and decreased fall risk (10/03/16)   Status On-going     PT LONG TERM GOAL #7   Title perform 5x STS in < 25 sec for improved functional strength and mobility (10/03/16)    Status On-going               Plan - 2016/10/08 1108    Clinical Impression Statement Pt presents today with c/o pain isolated to Lt low back which she describes as constant and also reporting increased burning with urination (has Rx sent to pharmacy for UTI, pt has not picked up yet).  Session limited today due to pain, and feel pain may be multifactorial, but recommended she pick up RX and take as prescribed to see if pain improves.  Will plan to assess goals next week and anticipate d/c from PT unless renewal indicated.  Progressing well with PT.   PT Treatment/Interventions ADLs/Self Care Home Management;Cryotherapy;Electrical Stimulation;Moist Heat;Ultrasound;Neuromuscular re-education;Therapeutic exercise;Balance training;Therapeutic activities;Functional mobility training;Patient/family education;Gait training;Manual techniques;Taping;Dry needling   PT Next Visit Plan review standing HEP, continue endurance/functional moiblity and strengthening   Consulted and Agree with Plan of Care Patient      Patient will benefit from skilled therapeutic intervention in order to improve the following deficits and impairments:  Abnormal gait, Decreased balance, Difficulty walking, Decreased mobility, Decreased strength, Pain, Increased fascial restricitons, Postural dysfunction, Impaired flexibility, Decreased activity tolerance  Visit Diagnosis: Chronic bilateral low back pain without sciatica  Muscle weakness (generalized)  Abnormal posture  Other abnormalities of gait and mobility       [facility] PT PB G-CODES - 10/08/16 1111    Functional Assessment Tool Used  clinical judgement; able to walk 5 min, bil hip/knee weakness   Functional Limitations Mobility: Walking and moving around   Mobility: Walking and Moving Around Current Status At least 40 percent but less than 60 percent impaired, limited or restricted   Mobility: Walking and Moving Around Goal Status [phone]) At least 20 percent but  less than 40 percent impaired, limited or restricted      Problem List Patient Active Problem List   Diagnosis Date Noted  . Chronic bilateral low back pain 08/12/2016  . Spondylosis of lumbar joint 08/12/2016  . Bilateral leg pain 08/12/2016  . Neck mass 04/15/2015  . Status post hip hemiarthroplasty 07/31/2013  . CAD (coronary artery disease) 07/31/2013  . CKD (chronic kidney disease), stage III 07/31/2013  . Left bundle branch block 07/31/2013  . Severe calcific aortic stenosis 06/18/2010  . Diabetic polyneuropathy ([place]) 04/16/2010  . GLAUCOMA 01/06/2009  .  CHEST PAIN, ATYPICAL 12/16/2008  . SHOULDER PAIN, RIGHT 11/16/2007  . OSTEOARTHRITIS 02/02/2007  . Type 2 diabetes mellitus with renal manifestations, controlled ([place]) 10/24/2006  . Dyslipidemia 10/02/2006  . Essential hypertension 10/02/2006      Laureen Abrahams, PT, DPT 09/24/16 11:12 AM    [facility] [address] [place], [place], [address] Phone: [phone]   Fax:  [phone]  Name: Sara Garza MRN: [id number] Date of Birth: [birth year]

## 2016-09-27 ENCOUNTER — Encounter: Payer: Self-pay | Admitting: Sports Medicine

## 2016-09-27 ENCOUNTER — Ambulatory Visit (INDEPENDENT_AMBULATORY_CARE_PROVIDER_SITE_OTHER): Payer: Medicare Other | Admitting: Physical Therapy

## 2016-09-27 ENCOUNTER — Ambulatory Visit (INDEPENDENT_AMBULATORY_CARE_PROVIDER_SITE_OTHER): Payer: Medicare Other | Admitting: Sports Medicine

## 2016-09-27 DIAGNOSIS — M6281 Muscle weakness (generalized): Secondary | ICD-10-CM | POA: Diagnosis not present

## 2016-09-27 DIAGNOSIS — G8929 Other chronic pain: Secondary | ICD-10-CM | POA: Diagnosis not present

## 2016-09-27 DIAGNOSIS — R2689 Other abnormalities of gait and mobility: Secondary | ICD-10-CM

## 2016-09-27 DIAGNOSIS — M545 Low back pain, unspecified: Secondary | ICD-10-CM

## 2016-09-27 DIAGNOSIS — R293 Abnormal posture: Secondary | ICD-10-CM | POA: Diagnosis not present

## 2016-09-27 DIAGNOSIS — I208 Other forms of angina pectoris: Secondary | ICD-10-CM

## 2016-09-27 NOTE — Progress Notes (Signed)
OFFICE VISIT NOTE Sara Garza. Sara Garza, [facility] - 83 y.o. female MRN [id number]  Date of birth: 1923/07/26  Visit Date: 09/27/2016  PCP: [name], NP   Referred by: [name], NP  [name], CMA acting as scribe for Dr. [name].  SUBJECTIVE:   Chief Complaint  Patient presents with  . Follow-up    chronic bilateral low back pain with sciaitica, osteoarthritis L-spine   HPI: As below and per problem based documentation when appropriate.  Pt presents today in follow-up of chronic bilateral low back pain with sciatica and osteoarthritis of l-spine.   Pt reports improvement in her back pain since her last visit. She has been doing PT with [facility] and feels this has been a big part of her improvement. She has not been taking the Gabapentin because it caused her to be too drowsy. She has been taking Tylenol Extra Strength with some relief. She reports that most of her pain is on the left side of her lower back now. She is no longer having radiating pain into the legs.  She has been doing home exercises with only occasional difficulty.   Pt denies fever, chills, night sweats.     Review of Systems  Constitutional: Negative for chills and fever.  Respiratory: Negative for shortness of breath and wheezing.   Cardiovascular: Positive for leg swelling. Negative for chest pain and palpitations.  Musculoskeletal: Positive for back pain. Negative for falls.  Neurological: Negative for dizziness, tingling and headaches.  Endo/Heme/Allergies: Does not bruise/bleed easily.    Otherwise per HPI.  HISTORY & PERTINENT PRIOR DATA:  No specialty comments available. She reports that she is a non-smoker but has been exposed to tobacco smoke. She has never used smokeless tobacco.   Recent Labs  12/19/15 1213 06/18/16 0945  HGBA1C 5.6 6.6*   Medications & Allergies reviewed per EMR Patient Active  Problem List   Diagnosis Date Noted  . Chronic bilateral low back pain 08/12/2016  . Spondylosis of lumbar joint 08/12/2016  . Bilateral leg pain 08/12/2016  . Neck mass 04/15/2015  . Status post hip hemiarthroplasty 07/31/2013  . CAD (coronary artery disease) 07/31/2013  . CKD (chronic kidney disease), stage III 07/31/2013  . Left bundle branch block 07/31/2013  . Severe calcific aortic stenosis 06/18/2010  . Diabetic polyneuropathy ([place]) 04/16/2010  . GLAUCOMA 01/06/2009  . CHEST PAIN, ATYPICAL 12/16/2008  . SHOULDER PAIN, RIGHT 11/16/2007  . OSTEOARTHRITIS 02/02/2007  . Type 2 diabetes mellitus with renal manifestations, controlled ([place]) 10/24/2006  . Dyslipidemia 10/02/2006  . Essential hypertension 10/02/2006   Past Medical History:  Diagnosis Date  . Arthritis   . CAD (coronary artery disease)   . Carotid stenosis, left    50-60% stable  . Diabetes mellitus   . Glaucoma   . Hyperlipidemia   . Hypertension   . Leg pain    ABIs 2/18: normal bilaterally.  . Osteoporosis    Family History  Problem Relation Age of Onset  . COPD Father   . Cancer Father        lung cancer  . Lung cancer Unknown    Past Surgical History:  Procedure Laterality Date  . ANOMALOUS PULMONARY VENOUS RETURN REPAIR, TOTAL    . APPENDECTOMY    . CARDIAC VALVE REPLACEMENT  04/29/2012  . Cataracts Bilateral   . CESAREAN SECTION    . FOOT SURGERY     [name]  .  HIP ARTHROPLASTY Right 07/31/2013   Procedure: ARTHROPLASTY BIPOLAR HIP;  Surgeon: [name], MD;  Location: [facility];  Service: Orthopedics;  Laterality: Right;  . PERCUTANEOUS CORONARY STENT INTERVENTION (PCI-S) N/A 01/02/2012   Procedure: PERCUTANEOUS CORONARY STENT INTERVENTION (PCI-S);  Surgeon: Sherren Mocha, MD;  Location: [facility] CATH LAB;  Service: Cardiovascular;  Laterality: N/A;   Social History   Occupational History  . Not on file.   Social History Main Topics  . Smoking status: Passive Smoke Exposure - Never Smoker    . Smokeless tobacco: Never Used  . Alcohol use 0.0 oz/week     Comment: very seldom  . Drug use: No  . Sexual activity: Not Currently    OBJECTIVE:  VS:  HT:5' (152.4 cm)   WT:167 lb 9.6 oz (76 kg)  BMI:32.8    BP:110/60  HR:67bpm  TEMP: ( )  RESP:94 % EXAM: Findings:  No acute distress.  Alert and appropriate.  Lower extremities have trace pitting edema.  DP and PT pulses are palpable although faint.  Her sit to stand test is less than 2 seconds was markedly improved.  She does walk with a wide-based stance.       No results found. ASSESSMENT & PLAN:     ICD-10-CM   1. Chronic bilateral low back pain, with sciatica presence unspecified M54.5    G89.29   ================================================================= Chronic bilateral low back pain >50% of this 25 minute visit spent in direct patient counseling and/or coordination of care.  Discussion was focused on education regarding the in discussing the pathoetiology and anticipated clinical course of the above condition.  Overall she is doing quite well.  Her mobility has improved.  She has been working with physical therapy and is showing good improvement.  She has regained vast majority of her independence however still is not back to the point she was prior to our initial meeting metatarsal fracture where she was quite mobile in the community.  She remains hesitant with ambulation due to fear of recurrent fall which is understandable she has been able to decrease her reliance on her mobility aids although she does continue to use this and I recommend she does continue to use this when she is ambulatory.  She will continue working with physical therapy finished the remainder of her sessions.  I would like to ensure that she continues to do well after discontinuing therapy and will plan to see her back in approximately 10 weeks.  If any worsening symptoms or worsening concerns I am happy to see her at any  time. ================================================================= There are no Patient Instructions on file for this visit.=================================================================   Follow-up: Return in about 10 weeks (around 12/06/2016).   CMA/ATC served as Education administrator during this visit. History, Physical, and Plan performed by medical provider. Documentation and orders reviewed and attested to.      Teresa Coombs, [facility] Physician

## 2016-09-27 NOTE — Therapy (Signed)
[facility] [address] [place], [place], [address] Phone: [phone]   Fax:  [phone]  Physical Therapy Treatment  Patient Details  Name: Sara Garza MRN: [id number] Date of Birth: 02/06/1924 Referring Provider: Dr. Legrand [place] Rigby/Nafziger, [name], NP  Encounter Date: 09/27/2016      PT End of Session - 09/27/16 1058    Visit Number 11   Number of Visits 12   Date for PT Re-Evaluation 10/03/16   Authorization Type Medicare - G Codes and Progress Notes   PT Start Time 1017   PT Stop Time 1057   PT Time Calculation (min) 40 min   Activity Tolerance Patient tolerated treatment well   Behavior During Therapy [facility] for tasks assessed/performed      Past Medical History:  Diagnosis Date  . Arthritis   . CAD (coronary artery disease)   . Carotid stenosis, left    50-60% stable  . Diabetes mellitus   . Glaucoma   . Hyperlipidemia   . Hypertension   . Leg pain    ABIs 2/18: normal bilaterally.  . Osteoporosis     Past Surgical History:  Procedure Laterality Date  . ANOMALOUS PULMONARY VENOUS RETURN REPAIR, TOTAL    . APPENDECTOMY    . CARDIAC VALVE REPLACEMENT  04/29/2012  . Cataracts Bilateral   . CESAREAN SECTION    . FOOT SURGERY     [name]  . HIP ARTHROPLASTY Right 07/31/2013   Procedure: ARTHROPLASTY BIPOLAR HIP;  Surgeon: [name], MD;  Location: [facility];  Service: Orthopedics;  Laterality: Right;  . PERCUTANEOUS CORONARY STENT INTERVENTION (PCI-S) N/A 01/02/2012   Procedure: PERCUTANEOUS CORONARY STENT INTERVENTION (PCI-S);  Surgeon: Sherren Mocha, MD;  Location: [facility] CATH LAB;  Service: Cardiovascular;  Laterality: N/A;    There were no vitals filed for this visit.      Subjective Assessment - 09/27/16 1022    Subjective Saw Dr. [name]: "he popped me, it hurt but feels better."  ready to transition to gym program.   Pertinent History Rt hip hemiarthroplasty 2015, OA, CAD, DM, HLD, HTN, osteoporosis   Patient Stated Goals be more comfortable and walk with decreased pain; fearful of falling and c/o LE weakness   Currently in Pain? No/denies                         [facility] Adult PT Treatment/Exercise - 09/27/16 0001      Transfers   Five time sit to stand comments  15.38 sec without UE support     Timed Up and Go Test   TUG Normal TUG   Normal TUG (seconds) 16.78  with [facility]     Self-Care   Self-Care Other Self-Care Comments   Other Self-Care Comments  POC and transition to [facility]; findings of TUG and 5x STS; use of RW v. SPC     Lumbar Exercises: Aerobic   Stationary Bike L2x8 min     Knee/Hip Exercises: Standing   Heel Raises Both;20 reps   Heel Raises Limitations toe raises x 20 reps   Hip Flexion Both;10 reps;Knee bent   Hip Abduction Both;10 reps;Knee straight   Hip Extension Both;10 reps;Knee straight                PT Education - 09/27/16 1058    Education provided Yes   Education Details see self care   Person(s) Educated Patient   Methods Explanation   Comprehension Verbalized  understanding             PT Long Term Goals - 09/27/16 1059      PT LONG TERM GOAL #1   Title independent with HEP (10/03/16)   Status Achieved     PT LONG TERM GOAL #2   Title report ability to walk for > 10 min without increase in pain for improved functional mobility (10/03/16)   Status On-going     PT LONG TERM GOAL #3   Title demonstrate 4/5 bil hip strength for improved functional mobility (10/03/16)   Status On-going     PT LONG TERM GOAL #4   Title perform sit to/from stand without UE support at least 5 times for improved functional strength (10/03/16)   Status Achieved     PT LONG TERM GOAL #5   Title formal balance goals to follow if indicated    Status Achieved     PT LONG TERM GOAL #6   Title improve timed up and go to < 14 sec for imroved mobility and decreased fall risk (10/03/16)   Status On-going     PT LONG TERM GOAL #7    Title perform 5x STS in < 25 sec for improved functional strength and mobility (10/03/16)   Status Achieved               Plan - 09/27/16 1059    Clinical Impression Statement Pt progressing well and anticipate transition to [employer] next week.   PT Treatment/Interventions ADLs/Self Care Home Management;Cryotherapy;Electrical Stimulation;Moist Heat;Ultrasound;Neuromuscular re-education;Therapeutic exercise;Balance training;Therapeutic activities;Functional mobility training;Patient/family education;Gait training;Manual techniques;Taping;Dry needling   PT Next Visit Plan finish checking goals, wrap up   Consulted and Agree with Plan of Care Patient      Patient will benefit from skilled therapeutic intervention in order to improve the following deficits and impairments:  Abnormal gait, Decreased balance, Difficulty walking, Decreased mobility, Decreased strength, Pain, Increased fascial restricitons, Postural dysfunction, Impaired flexibility, Decreased activity tolerance  Visit Diagnosis: Chronic bilateral low back pain without sciatica  Muscle weakness (generalized)  Abnormal posture  Other abnormalities of gait and mobility     Problem List Patient Active Problem List   Diagnosis Date Noted  . Chronic bilateral low back pain 08/12/2016  . Spondylosis of lumbar joint 08/12/2016  . Bilateral leg pain 08/12/2016  . Neck mass 04/15/2015  . Status post hip hemiarthroplasty 07/31/2013  . CAD (coronary artery disease) 07/31/2013  . CKD (chronic kidney disease), stage III 07/31/2013  . Left bundle branch block 07/31/2013  . Severe calcific aortic stenosis 06/18/2010  . Diabetic polyneuropathy ([place]) 04/16/2010  . GLAUCOMA 01/06/2009  . CHEST PAIN, ATYPICAL 12/16/2008  . SHOULDER PAIN, RIGHT 11/16/2007  . OSTEOARTHRITIS 02/02/2007  . Type 2 diabetes mellitus with renal manifestations, controlled ([place]) 10/24/2006  . Dyslipidemia 10/02/2006  . Essential hypertension  10/02/2006      Laureen Abrahams, PT, DPT 09/27/16 11:00 AM    [facility] [place], [place], [address] Phone: [phone]   Fax:  [phone]  Name: Sara Garza MRN: [id number] Date of Birth: [birth year]

## 2016-10-03 DIAGNOSIS — R350 Frequency of micturition: Secondary | ICD-10-CM | POA: Diagnosis not present

## 2016-10-07 DIAGNOSIS — E119 Type 2 diabetes mellitus without complications: Secondary | ICD-10-CM | POA: Diagnosis not present

## 2016-10-07 DIAGNOSIS — H353132 Nonexudative age-related macular degeneration, bilateral, intermediate dry stage: Secondary | ICD-10-CM | POA: Diagnosis not present

## 2016-10-07 DIAGNOSIS — H401132 Primary open-angle glaucoma, bilateral, moderate stage: Secondary | ICD-10-CM | POA: Diagnosis not present

## 2016-10-07 DIAGNOSIS — Z961 Presence of intraocular lens: Secondary | ICD-10-CM | POA: Diagnosis not present

## 2016-10-07 LAB — HM DIABETES EYE EXAM

## 2016-10-08 ENCOUNTER — Encounter: Payer: Self-pay | Admitting: Family Medicine

## 2016-10-08 ENCOUNTER — Ambulatory Visit (INDEPENDENT_AMBULATORY_CARE_PROVIDER_SITE_OTHER): Payer: Medicare Other | Admitting: Physical Therapy

## 2016-10-08 DIAGNOSIS — R2689 Other abnormalities of gait and mobility: Secondary | ICD-10-CM | POA: Diagnosis not present

## 2016-10-08 DIAGNOSIS — G8929 Other chronic pain: Secondary | ICD-10-CM

## 2016-10-08 DIAGNOSIS — M545 Low back pain, unspecified: Secondary | ICD-10-CM

## 2016-10-08 DIAGNOSIS — M6281 Muscle weakness (generalized): Secondary | ICD-10-CM | POA: Diagnosis not present

## 2016-10-08 DIAGNOSIS — R293 Abnormal posture: Secondary | ICD-10-CM

## 2016-10-08 NOTE — Therapy (Addendum)
[facility] [address] [place], [place], [address] Phone: [phone]   Fax:  [phone]  Physical Therapy Treatment/Discharge  Patient Details  Name: Sara Garza MRN: [id number] Date of Birth: 1924/03/19 Referring Provider: Dr. Legrand Como Rigby/Nafziger, [name], NP  Encounter Date: 10/08/2016      PT End of Session - 10/08/16 1153    Visit Number 12   Authorization Type Medicare - G Codes and Progress Notes   PT Start Time 1101   PT Stop Time 1148   PT Time Calculation (min) 47 min   Activity Tolerance Patient tolerated treatment well;Patient limited by fatigue   Behavior During Therapy [facility] for tasks assessed/performed      Past Medical History:  Diagnosis Date  . Arthritis   . CAD (coronary artery disease)   . Carotid stenosis, left    50-60% stable  . Diabetes mellitus   . Glaucoma   . Hyperlipidemia   . Hypertension   . Leg pain    ABIs 2/18: normal bilaterally.  . Osteoporosis     Past Surgical History:  Procedure Laterality Date  . ANOMALOUS PULMONARY VENOUS RETURN REPAIR, TOTAL    . APPENDECTOMY    . CARDIAC VALVE REPLACEMENT  04/29/2012  . Cataracts Bilateral   . CESAREAN SECTION    . FOOT SURGERY     [name]  . HIP ARTHROPLASTY Right 07/31/2013   Procedure: ARTHROPLASTY BIPOLAR HIP;  Surgeon: [name], MD;  Location: [facility];  Service: Orthopedics;  Laterality: Right;  . PERCUTANEOUS CORONARY STENT INTERVENTION (PCI-S) N/A 01/02/2012   Procedure: PERCUTANEOUS CORONARY STENT INTERVENTION (PCI-S);  Surgeon: Sherren Mocha, MD;  Location: [facility] CATH LAB;  Service: Cardiovascular;  Laterality: N/A;    There were no vitals filed for this visit.      Subjective Assessment - 10/08/16 1102    Subjective doing well; missed last week but was able to get to [facility].  "My legs just don't want to work today."   Pertinent History Rt hip hemiarthroplasty 2015, OA, CAD, DM, HLD, HTN, osteoporosis   Patient Stated Goals be more comfortable and walk with decreased pain; fearful of falling and c/o LE weakness   Currently in Pain? No/denies            [facility] PT Assessment - 10/08/16 1119      Strength   Right Hip Flexion 4/5   Right Hip Extension 3+/5   Right Hip ABduction 3+/5   Left Hip Flexion 4+/5   Left Hip Extension 4/5   Left Hip ABduction 4/5   Right Knee Flexion 3+/5   Right Knee Extension 5/5   Left Knee Flexion 3+/5   Left Knee Extension 5/5                     [facility] Adult PT Treatment/Exercise - 10/08/16 1119      Ambulation/Gait   Gait Comments gait for endurance: 3 min then needing seated rest break     Timed Up and Go Test   TUG Normal TUG   Normal TUG (seconds) 12.75  with SPC     Lumbar Exercises: Aerobic   Stationary Bike L1 x 6 min                PT Education - 10/08/16 1152    Education provided Yes   Education Details long discussion about exercise classes at [employer] center and which ones to attend   [employer]) Educated Patient  Methods Explanation   Comprehension Verbalized understanding             PT Long Term Goals - 10/22/16 1153      PT LONG TERM GOAL #1   Title independent with HEP (10/03/16)   Status Achieved     PT LONG TERM GOAL #2   Title report ability to walk for > 10 min without increase in pain for improved functional mobility (10/03/16)   Baseline 10/22/16: at most 5 min; due to fatigue but no increase in pain   Status Partially Met     PT LONG TERM GOAL #3   Title demonstrate 4/5 bil hip strength for improved functional mobility (10/03/16)   Status Partially Met     PT LONG TERM GOAL #4   Title perform sit to/from stand without UE support at least 5 times for improved functional strength (10/03/16)   Status Achieved     PT LONG TERM GOAL #5   Title formal balance goals to follow if indicated    Status Achieved     PT LONG TERM GOAL #6   Title improve timed up and go to < 14 sec for  imroved mobility and decreased fall risk (10/03/16)   Status Achieved     PT LONG TERM GOAL #7   Title perform 5x STS in < 25 sec for improved functional strength and mobility (10/03/16)   Status Achieved               Plan - 10/22/16 1154    Clinical Impression Statement Pt has met/partially met all LTGs and is ready for d/c.  Strength is improved in bil lower extremities and at goal for some muscle groups but not all.  Went to [employer] last week and has plan for classes to attend and additional exercises to perform.  Agreeable to d/c.   PT Treatment/Interventions ADLs/Self Care Home Management;Cryotherapy;Electrical Stimulation;Moist Heat;Ultrasound;Neuromuscular re-education;Therapeutic exercise;Balance training;Therapeutic activities;Functional mobility training;Patient/family education;Gait training;Manual techniques;Taping;Dry needling   PT Next Visit Plan d/c PT   Consulted and Agree with Plan of Care Patient      Patient will benefit from skilled therapeutic intervention in order to improve the following deficits and impairments:  Abnormal gait, Decreased balance, Difficulty walking, Decreased mobility, Decreased strength, Pain, Increased fascial restricitons, Postural dysfunction, Impaired flexibility, Decreased activity tolerance  Visit Diagnosis: Chronic bilateral low back pain without sciatica  Muscle weakness (generalized)  Abnormal posture  Other abnormalities of gait and mobility       [facility] PT PB G-CODES - 10-22-16 1155    Functional Assessment Tool Used  clinical judgement; able to walk 5 min, bil hip/knee strength improving; balance goals met   Functional Limitations Mobility: Walking and moving around   Mobility: Walking and Moving Around Goal Status [phone]) At least 20 percent but less than 40 percent impaired, limited or restricted   Mobility: Walking and Moving Around Discharge Status [phone]) At least 20 percent but less than 40 percent impaired,  limited or restricted      Problem List Patient Active Problem List   Diagnosis Date Noted  . Chronic bilateral low back pain 08/12/2016  . Spondylosis of lumbar joint 08/12/2016  . Bilateral leg pain 08/12/2016  . Neck mass 04/15/2015  . Status post hip hemiarthroplasty 07/31/2013  . CAD (coronary artery disease) 07/31/2013  . CKD (chronic kidney disease), stage III 07/31/2013  . Left bundle branch block 07/31/2013  . Severe calcific aortic stenosis 06/18/2010  . Diabetic polyneuropathy (  [place]) 04/16/2010  . GLAUCOMA 01/06/2009  . CHEST PAIN, ATYPICAL 12/16/2008  . SHOULDER PAIN, RIGHT 11/16/2007  . OSTEOARTHRITIS 02/02/2007  . Type 2 diabetes mellitus with renal manifestations, controlled ([place]) 10/24/2006  . Dyslipidemia 10/02/2006  . Essential hypertension 10/02/2006      Laureen Abrahams, PT, DPT 10/08/16 11:57 AM    [facility] [address] [place], [place], [address] Phone: [phone]   Fax:  [phone]  Name: Sara Garza MRN: [id number] Date of Birth: [birth year]      PHYSICAL THERAPY DISCHARGE SUMMARY  Visits from Start of Care: 12  Current functional level related to goals / functional outcomes: See above   Remaining deficits: Decreased endurance affecting functional mobility   Education / Equipment: HEP, community fitness  Plan: Patient agrees to discharge.  Patient goals were partially met. Patient is being discharged due to being pleased with the current functional level.  ?????    Laureen Abrahams, PT, DPT 10/08/16 12:00 PM   [facility] [address] [place], [place], [address] Phone: [phone]  Fax: [phone]

## 2016-10-17 ENCOUNTER — Other Ambulatory Visit: Payer: Self-pay | Admitting: Internal Medicine

## 2016-10-17 DIAGNOSIS — N3941 Urge incontinence: Secondary | ICD-10-CM | POA: Diagnosis not present

## 2016-10-17 DIAGNOSIS — R278 Other lack of coordination: Secondary | ICD-10-CM | POA: Diagnosis not present

## 2016-10-17 DIAGNOSIS — R293 Abnormal posture: Secondary | ICD-10-CM | POA: Diagnosis not present

## 2016-10-17 DIAGNOSIS — M62838 Other muscle spasm: Secondary | ICD-10-CM | POA: Diagnosis not present

## 2016-10-17 DIAGNOSIS — N393 Stress incontinence (female) (male): Secondary | ICD-10-CM | POA: Diagnosis not present

## 2016-10-17 DIAGNOSIS — M6281 Muscle weakness (generalized): Secondary | ICD-10-CM | POA: Diagnosis not present

## 2016-10-17 DIAGNOSIS — R262 Difficulty in walking, not elsewhere classified: Secondary | ICD-10-CM | POA: Diagnosis not present

## 2016-10-21 ENCOUNTER — Other Ambulatory Visit: Payer: Self-pay | Admitting: Adult Health

## 2016-10-23 NOTE — Telephone Encounter (Signed)
Sent to the pharmacy by e-scribe for 9 months.  Pt had yearly and lab work on 06/18/16.

## 2016-11-05 DIAGNOSIS — R262 Difficulty in walking, not elsewhere classified: Secondary | ICD-10-CM | POA: Diagnosis not present

## 2016-11-05 DIAGNOSIS — M62838 Other muscle spasm: Secondary | ICD-10-CM | POA: Diagnosis not present

## 2016-11-05 DIAGNOSIS — M6281 Muscle weakness (generalized): Secondary | ICD-10-CM | POA: Diagnosis not present

## 2016-11-05 DIAGNOSIS — R3915 Urgency of urination: Secondary | ICD-10-CM | POA: Diagnosis not present

## 2016-11-05 DIAGNOSIS — N3941 Urge incontinence: Secondary | ICD-10-CM | POA: Diagnosis not present

## 2016-11-08 ENCOUNTER — Telehealth: Payer: Self-pay | Admitting: Cardiovascular Disease

## 2016-11-08 NOTE — Telephone Encounter (Signed)
Patient called in with questions regarding metoprolol ER and carvedilol, which one should she take as they are due to get refilled and doesn't want to refill them if she doesn't need them both.  She wants to know which medication Dr. Burt Knack recommends her to be on. Please call her at [phone].

## 2016-11-09 NOTE — Assessment & Plan Note (Addendum)
>  50% of this 25 minute visit spent in direct patient counseling and/or coordination of care.  Discussion was focused on education regarding the in discussing the pathoetiology and anticipated clinical course of the above condition.  Overall she is doing quite well.  Her mobility has improved.  She has been working with physical therapy and is showing good improvement.  She has regained vast majority of her independence however still is not back to the point she was prior to our initial meeting metatarsal fracture where she was quite mobile in the community.  She remains hesitant with ambulation due to fear of recurrent fall which is understandable she has been able to decrease her reliance on her mobility aids although she does continue to use this and I recommend she does continue to use this when she is ambulatory.  She will continue working with physical therapy finished the remainder of her sessions.  I would like to ensure that she continues to do well after discontinuing therapy and will plan to see her back in approximately 10 weeks.  If any worsening symptoms or worsening concerns I am happy to see her at any time.

## 2016-11-11 NOTE — Telephone Encounter (Signed)
Looks like patients metoprolol was stopped by Teresa Coombs, DO at [facility] and patient was started on carvedilol at that same visit. Patient is requesting Dr York Cerise advise. She is due for a refill. Please advise. Thanks, MI

## 2016-11-11 NOTE — Telephone Encounter (Signed)
Copied from Dr Rachael Fee 08/12/16 office note.  11/11/16--pt states Dr Burt Knack prescribed metoprolol then it was changed to carvedilol by Dr Bing Plume. Pt states it is time for a refill of carvedilol. Pt wants to be sure Dr Burt Knack okay with medication change from metoprolol to carvedilol. Pt advised to go ahead and refill carvedilol, I will forward to Dr Burt Knack for review. She knows it will probably be first week before she hears back from Dr Burt Knack.

## 2016-11-11 NOTE — Telephone Encounter (Signed)
°  New message  Pt call to f/u on Metoprolol request. Please call back to discuss

## 2016-11-11 NOTE — Telephone Encounter (Signed)
Essential hypertension (Chronic)     Recently she has had Toprol added to her regimen.  Surprisingly up-to-date does report that metoprolol does have an undisclosed frequency of musculoskeletal pain suspect this may be a contributing factor.  Given her blood pressure is still above goal I did take the liberty to have her discontinue metoprolol and began her on Coreg which does not have this associated side effects reported and may give some additional alpha blockade.  Medication compliance emphasized with patient today and she voices understanding and is agreeable to this plan.  I will plan to let Dr. Burt Knack know of this change and if any disagreement I will defer to his expertise but do think the significant onset of her symptoms seem to correlate strongly with starting his new medicine.

## 2016-11-11 NOTE — Telephone Encounter (Signed)
Dr Teresa Coombs not Dr Bing Plume is MD that made medication change, sorry for that error

## 2016-11-12 NOTE — Telephone Encounter (Signed)
This is fine thanks 

## 2016-11-13 DIAGNOSIS — N952 Postmenopausal atrophic vaginitis: Secondary | ICD-10-CM | POA: Diagnosis not present

## 2016-11-13 DIAGNOSIS — R3915 Urgency of urination: Secondary | ICD-10-CM | POA: Diagnosis not present

## 2016-11-13 DIAGNOSIS — N3946 Mixed incontinence: Secondary | ICD-10-CM | POA: Diagnosis not present

## 2016-11-13 NOTE — Telephone Encounter (Signed)
Patient informed and verbalized understanding of plan. 

## 2016-11-19 DIAGNOSIS — R3915 Urgency of urination: Secondary | ICD-10-CM | POA: Diagnosis not present

## 2016-11-19 DIAGNOSIS — M62838 Other muscle spasm: Secondary | ICD-10-CM | POA: Diagnosis not present

## 2016-11-19 DIAGNOSIS — M6281 Muscle weakness (generalized): Secondary | ICD-10-CM | POA: Diagnosis not present

## 2016-11-19 DIAGNOSIS — N3941 Urge incontinence: Secondary | ICD-10-CM | POA: Diagnosis not present

## 2016-11-19 DIAGNOSIS — R350 Frequency of micturition: Secondary | ICD-10-CM | POA: Diagnosis not present

## 2016-11-20 ENCOUNTER — Encounter: Payer: Self-pay | Admitting: Adult Health

## 2016-11-20 ENCOUNTER — Ambulatory Visit (INDEPENDENT_AMBULATORY_CARE_PROVIDER_SITE_OTHER): Payer: Medicare Other | Admitting: Adult Health

## 2016-11-20 VITALS — BP 148/58 | Temp 98.3°F | Ht 60.0 in | Wt 166.0 lb

## 2016-11-20 DIAGNOSIS — I208 Other forms of angina pectoris: Secondary | ICD-10-CM

## 2016-11-20 DIAGNOSIS — R32 Unspecified urinary incontinence: Secondary | ICD-10-CM

## 2016-11-20 DIAGNOSIS — I10 Essential (primary) hypertension: Secondary | ICD-10-CM

## 2016-11-20 MED ORDER — CARVEDILOL 3.125 MG PO TABS
3.1250 mg | ORAL_TABLET | Freq: Two times a day (BID) | ORAL | 3 refills | Status: DC
Start: 2016-11-20 — End: 2017-02-26

## 2016-11-20 NOTE — Progress Notes (Signed)
Subjective:    Patient ID: Sara Garza, female    DOB: [birth year], 83 y.o.   MRN: [id number]  HPI  83 year old female who  has a past medical history of Arthritis; CAD (coronary artery disease); Carotid stenosis, left; Diabetes mellitus; Glaucoma; Hyperlipidemia; Hypertension; Leg pain; and Osteoporosis. She presents to the office to discuss Myrbetriq. She was prescribed this earlier by her Urology for incontinence issues. She would like to make sure it is ok for her to take with the other medications she is prescribed.   Over all she is doing well, continues to have MSK pain but feels as though she is getting around ok.   She has no acute complaints   She needs a refill of Coreg   Review of Systems See HPI   Past Medical History:  Diagnosis Date  . Arthritis   . CAD (coronary artery disease)   . Carotid stenosis, left    50-60% stable  . Diabetes mellitus   . Glaucoma   . Hyperlipidemia   . Hypertension   . Leg pain    ABIs 2/18: normal bilaterally.  . Osteoporosis     Social History   Social History  . Marital status: Widowed    Spouse name: N/A  . Number of children: N/A  . Years of education: N/A   Occupational History  . Not on file.   Social History Main Topics  . Smoking status: Passive Smoke Exposure - Never Smoker  . Smokeless tobacco: Never Used  . Alcohol use 0.0 oz/week     Comment: very seldom  . Drug use: No  . Sexual activity: Not Currently   Other Topics Concern  . 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 ANOMALOUS PULMONARY VENOUS RETURN REPAIR, TOTAL    . APPENDECTOMY    . CARDIAC VALVE REPLACEMENT  04/29/2012  . Cataracts Bilateral   . CESAREAN SECTION    . FOOT SURGERY     [name]  . HIP ARTHROPLASTY Right 07/31/2013   Procedure:  ARTHROPLASTY BIPOLAR HIP;  Surgeon: [name], MD;  Location: [facility];  Service: Orthopedics;  Laterality: Right;  . PERCUTANEOUS CORONARY STENT INTERVENTION (PCI-S) N/A 01/02/2012   Procedure: PERCUTANEOUS CORONARY STENT INTERVENTION (PCI-S);  Surgeon: Sherren Mocha, MD;  Location[facility] CATH LAB;  Service: Cardiovascular;  Laterality: N/A;    Family History  Problem Relation Age of Onset  . COPD Father   . Cancer Father        lung cancer  . Lung cancer Unknown     Allergies  Allergen Reactions  . Statins Other (See Comments)    Muscle aches  . Gabapentin Other (See Comments)    SOMNOLENCE    Current Outpatient Prescriptions on File Prior to Visit  Medication Sig Dispense Refill  . amLODipine (NORVASC) 5 MG tablet TAKE 1 TABLET(5 MG) BY MOUTH DAILY 90 tablet 2  . aspirin EC 81 MG tablet Take 1 tablet (81 mg total) by mouth daily. Start after lovenox injections are done after 2 weeks    . bimatoprost (LUMIGAN) 0.01 % SOLN Place 1 drop into both eyes at bedtime.     . Calcium Carb-Cholecalciferol (CALCIUM 500 +D) 500-400 MG-UNIT  TABS Take 1 tablet by mouth daily.     . carvedilol (COREG) 3.125 MG tablet Take 1 tablet (3.125 mg total) by mouth 2 (two) times daily with a meal. 60 tablet 3  . ezetimibe (ZETIA) 10 MG tablet Take 1 tablet (10 mg total) by mouth daily. 30 tablet 11  . furosemide (LASIX) 20 MG tablet Take 0.5 tablets (10 mg total) by mouth daily. 45 tablet 3  . isosorbide mononitrate (IMDUR) 60 MG 24 hr tablet Take 1 tablet (60 mg total) by mouth daily. 30 tablet 11  . Multiple Vitamin (MULTIVITAMIN) tablet Take 1 tablet by mouth daily.      [name] amoxicillin (AMOXIL) 500 MG capsule TAKE 4 CAPSULE BY MOUTH ONE HOURS PRIOR TO DENTAL PROCEDURES (Patient not taking: Reported on 11/20/2016) 8 capsule 1   No current facility-administered medications on file prior to visit.     BP (!) 148/58 (BP Location: Left Arm)   Temp 98.3 F (36.8 C) (Oral)   Ht 5' (1.524 m)   Wt 166 lb  (75.3 kg)   BMI 32.42 kg/m       Objective:   Physical Exam  Constitutional: She is oriented to person, place, and time. She appears well-developed and well-nourished. No distress.  Cardiovascular: Normal rate, regular rhythm, normal heart sounds and intact distal pulses.  Exam reveals no gallop and no friction rub.   No murmur heard. Pulmonary/Chest: Effort normal and breath sounds normal. No respiratory distress. She has no wheezes. She has no rales. She exhibits no tenderness.  Musculoskeletal: She exhibits edema (non pitting edema ).  Neurological: She is alert and oriented to person, place, and time.  Skin: Skin is warm and dry. No rash noted. She is not diaphoretic. No erythema. No pallor.  Psychiatric: She has a normal mood and affect. Her behavior is normal. Judgment and thought content normal.  Nursing note and vitals reviewed.     Assessment & Plan:  1. Urinary incontinence, unspecified type - I am ok with her taking Myrbetriq. Advised not to go above 25 mg due to CKD  - mirabegron ER (MYRBETRIQ) 25 MG TB24 tablet; Take 25 mg by mouth daily.  2. Essential hypertension  - carvedilol (COREG) 3.125 MG tablet; Take 1 tablet (3.125 mg total) by mouth 2 (two) times daily with a meal.  Dispense: 180 tablet; Refill: 3  [name], NP

## 2016-11-27 DIAGNOSIS — N184 Chronic kidney disease, stage 4 (severe): Secondary | ICD-10-CM | POA: Diagnosis not present

## 2016-12-04 ENCOUNTER — Ambulatory Visit (INDEPENDENT_AMBULATORY_CARE_PROVIDER_SITE_OTHER): Payer: Medicare Other

## 2016-12-04 VITALS — BP 120/60 | HR 68 | Ht 60.0 in | Wt 166.0 lb

## 2016-12-04 DIAGNOSIS — Z0000 Encounter for general adult medical examination without abnormal findings: Secondary | ICD-10-CM | POA: Diagnosis not present

## 2016-12-04 NOTE — Progress Notes (Signed)
Subjective:   Sara Garza is a 83 y.o. female who presents for Medicare Annual (Subsequent) preventive examination.  The Patient was informed that the wellness visit is to identify future health risk and educate and initiate measures that can reduce risk for increased disease through the lifespan.    Annual Wellness Assessment  Reports health as good   Preventive Screening -Counseling & Management  Medicare Annual Preventive Care Visit - Subsequent Last OV 08/22 UA incont Was in PT for her back; Dr. [name] Helped somewhat  Lives alone  One level home Good neighbors Has alert and discussed carrying it to the bathroom when she is up 3 to 4 times ; recommended she take her alert alarm with her  Walk in shower Does her own laundry  Cooks some or goes out  Had dtr who is 65 Lives in a community with seniors   May try the [facility] center  Address: [address] [place], [place] [address]  Hours:  Open now   Add full hours  Phone: [phone]    Foot exam completed; bunions noted bilaterally Some edema and pedal pulses difficult to palpate Otherwise color good. Sensation noted but weaker in the back of the foot.  Mammogram 03/2016- plans to complete 03/2017 Bone density ; 03/2013; -1.9 Does not want to take medicine. Will not repeat the dexa; but is taking calcium    VS reviewed;   Diet  Has OJ, cup of tea, mini crumb bun Lunch; has ham or roast beef on an english muffin Supper - ham or roast beef  Sweets during the day  Likes to eat with a person (educated that with pre-diabetes; OJ has a lot of sugar and may choose to dilute or decrease)  BMI 32   Exercise Up and down in the home Will go to [place] Uses some machines   Diabetic eye exam 09/2016 Eye exam 09/2016 - [facility] -no diabetic retinopathy  Hearing Screening Comments: Has hearing aids  Vision Screening Comments: Vision - keep it monitored  Has glaucoma Dr. Kathrin Penner    Dental - goes q  4 to 6 months   Stressors no issues at present:   Sleep patterns: could sleep if she did not have to go to the bathroom   Pain-Tylenol x 2 in the am and afternoon and 2 at hs     Cardiac Risk Factors Addressed Hyperlipidemia - chol/hdl 5; chol 233; hdl 45; ldl 161 and trig 136  Diabetes A1c 6.6  Discussed cutting back on sweets Increasing exercise Was concerned and would like to decrease her A1c, discussed weight loss   Obesity 33 discussed   Advanced Directives completed   Patient Care Team: [name], NP as PCP - General (Family Medicine) Sherren Mocha, MD (Cardiology) Assessed for additional providers  Immunization History  Administered Date(s) Administered  . Hepatitis A 10/09/2005  . Influenza Split 01/02/2011, 12/24/2011  . Influenza Whole 04/01/2005, 01/01/2007, 01/02/2008, 01/06/2009, 01/17/2010  . Influenza, High Dose Seasonal PF 01/04/2014, 01/06/2015, 01/09/2016  . Influenza,inj,Quad PF,6+ Mos 12/25/2012  . Pneumococcal Conjugate-13 02/22/2013  . Pneumococcal Polysaccharide-23 04/01/2005  . Td 04/01/2000  . Tetanus 07/16/2013  . Zoster 10/09/2005   Required Immunizations needed today  Screening test up to date or reviewed for plan of completion Health Maintenance Due  Topic Date Due  . FOOT EXAM  01/06/2016  . INFLUENZA VACCINE  10/30/2016         Objective:     Vitals:  BP 120/60   Pulse 68   Ht 5' (1.524 m)   Wt 166 lb (75.3 kg)   SpO2 94%   BMI 32.42 kg/m   Body mass index is 32.42 kg/m.   Tobacco History  Smoking Status  . Passive Smoke Exposure - Never Smoker  Smokeless Tobacco  . Never Used     Counseling given: Yes   Past Medical History:  Diagnosis Date  . Arthritis   . CAD (coronary artery disease)   . Carotid stenosis, left    50-60% stable  . Diabetes mellitus   . Glaucoma   . Hyperlipidemia   . Hypertension   . Leg pain    ABIs 2/18: normal bilaterally.  . Osteoporosis    Past  Surgical History:  Procedure Laterality Date  . ANOMALOUS PULMONARY VENOUS RETURN REPAIR, TOTAL    . APPENDECTOMY    . CARDIAC VALVE REPLACEMENT  04/29/2012  . Cataracts Bilateral   . CESAREAN SECTION    . FOOT SURGERY     [name]  . HIP ARTHROPLASTY Right 07/31/2013   Procedure: ARTHROPLASTY BIPOLAR HIP;  Surgeon: [name], MD;  Location: [facility];  Service: Orthopedics;  Laterality: Right;  . PERCUTANEOUS CORONARY STENT INTERVENTION (PCI-S) N/A 01/02/2012   Procedure: PERCUTANEOUS CORONARY STENT INTERVENTION (PCI-S);  Surgeon: Sherren Mocha, MD;  Location: [facility] CATH LAB;  Service: Cardiovascular;  Laterality: N/A;   Family History  Problem Relation Age of Onset  . COPD Father   . Cancer Father        lung cancer  . Lung cancer Unknown    History  Sexual Activity  . Sexual activity: Not Currently    Outpatient Encounter Prescriptions as of 12/04/2016  Medication Sig  . amLODipine (NORVASC) 5 MG tablet TAKE 1 TABLET(5 MG) BY MOUTH DAILY  . aspirin EC 81 MG tablet Take 1 tablet (81 mg total) by mouth daily. Start after lovenox injections are done after 2 weeks  . bimatoprost (LUMIGAN) 0.01 % SOLN Place 1 drop into both eyes at bedtime.   . Calcium Carb-Cholecalciferol (CALCIUM 500 +D) 500-400 MG-UNIT TABS Take 1 tablet by mouth daily.   . carvedilol (COREG) 3.125 MG tablet Take 1 tablet (3.125 mg total) by mouth 2 (two) times daily with a meal.  . cephALEXin (KEFLEX) 250 MG capsule TK ONE C PO QHS  . ezetimibe (ZETIA) 10 MG tablet Take 1 tablet (10 mg total) by mouth daily.  . furosemide (LASIX) 20 MG tablet Take 0.5 tablets (10 mg total) by mouth daily.  . isosorbide mononitrate (IMDUR) 60 MG 24 hr tablet Take 1 tablet (60 mg total) by mouth daily.  . mirabegron ER (MYRBETRIQ) 25 MG TB24 tablet Take 25 mg by mouth daily.  . Multiple Vitamin (MULTIVITAMIN) tablet Take 1 tablet by mouth daily.    . [DISCONTINUED] amoxicillin (AMOXIL) 500 MG capsule TAKE 4 CAPSULE BY MOUTH ONE  HOURS PRIOR TO DENTAL PROCEDURES (Patient not taking: Reported on 11/20/2016)   No facility-administered encounter medications on file as of 12/04/2016.     Activities of Daily Living In your present state of health, do you have any difficulty performing the following activities: 12/04/2016  Hearing? Y  Vision? N  Difficulty concentrating or making decisions? N  Walking or climbing stairs? Y  Dressing or bathing? N  Doing errands, shopping? N  Preparing Food and eating ? N  Using the Toilet? N  In the past six months, have you accidently leaked urine? Y  Do you  have problems with loss of bowel control? N  Managing your Medications? N  Managing your Finances? N  Housekeeping or managing your Housekeeping? N  Some recent data might be hidden    Patient Care Team: [name], NP as PCP - General (Family Medicine) Sherren Mocha, MD (Cardiology)    Assessment:     Exercise Activities and Dietary recommendations Current Exercise Habits: Home exercise routine, Time (Minutes): 20, Intensity: Mild  Goals    . patient          Continue to get out of the home 3 to 4 times a week Maybe eat lunch out 2 times a week  Continue to go to the [facility]    Try to add some variety to your diet       Fall Risk Fall Risk  12/04/2016 01/06/2015 07/16/2013 07/16/2013 06/25/2011  Falls in the past year? No Yes No No -  Number falls in past yr: - 2 or more - - -  Injury with Fall? - Yes - - -  Risk for fall due to : - - - - Impaired mobility   Depression Screen PHQ 2/9 Scores 12/04/2016 01/06/2015 07/16/2013 07/16/2013  PHQ - 2 Score 0 0 0 0     Cognitive Function Ad8 score reviewed for issues:  Issues making decisions:  Less interest in hobbies / activities:  Repeats questions, stories (family complaining):  Trouble using ordinary gadgets (microwave, computer, phone):  Forgets the month or year:   Mismanaging finances:   Remembering appts:  Daily problems with thinking  and/or memory: Ad8 score is=0 Good historian and has not failed independent living, Does note changes in her ability to travel from last year.         Immunization History  Administered Date(s) Administered  . Hepatitis A 10/09/2005  . Influenza Split 01/02/2011, 12/24/2011  . Influenza Whole 04/01/2005, 01/01/2007, 01/02/2008, 01/06/2009, 01/17/2010  . Influenza, High Dose Seasonal PF 01/04/2014, 01/06/2015, 01/09/2016  . Influenza,inj,Quad PF,6+ Mos 12/25/2012  . Pneumococcal Conjugate-13 02/22/2013  . Pneumococcal Polysaccharide-23 04/01/2005  . Td 04/01/2000  . Tetanus 07/16/2013  . Zoster 10/09/2005   Screening Tests Health Maintenance  Topic Date Due  . FOOT EXAM  01/06/2016  . INFLUENZA VACCINE  10/30/2016  . HEMOGLOBIN A1C  12/19/2016  . MAMMOGRAM  03/28/2017  . URINE MICROALBUMIN  06/18/2017  . OPHTHALMOLOGY EXAM  10/07/2017  . TETANUS/TDAP  07/17/2023  . DEXA SCAN  Completed  . PNA vac Low Risk Adult  Completed      Plan:     PCP Notes   Health Maintenance Discussed her flu vaccine, but generally takes it later in Sept. Agreed to come back for flu clinic later in [place].     Abnormal Screens  Spent 30 minutes discussing diet; exercise and prediabetes Agreed to cut back on sugar, especially OJ in the am  Will try to get to keep walking and get out more  Referrals  None today   Patient concerns; Regarding pre-diabetes;   Nurse Concerns; As noted   Next PCP apt March 2019 or sooner as needed       I have personally reviewed and noted the following in the patient's chart:   . Medical and social history . Use of alcohol, tobacco or illicit drugs  . Current medications and supplements . Functional ability and status . Nutritional status . Physical activity . Advanced directives . List of other physicians . Hospitalizations, surgeries, and ER visits in previous 58  months . Vitals . Screenings to include cognitive, depression, and  falls . Referrals and appointments  In addition, I have reviewed and discussed with patient certain preventive protocols, quality metrics, and best practice recommendations. A written personalized care plan for preventive services as well as general preventive health recommendations were provided to patient.     Wynetta Fines, RN  12/04/2016

## 2016-12-04 NOTE — Progress Notes (Signed)
I have reviewed and agree with this plan  

## 2016-12-04 NOTE — Patient Instructions (Addendum)
Ms. Willenbring , Thank you for taking time to come for your Medicare Wellness Visit. I appreciate your ongoing commitment to your health goals. Please review the following plan we discussed and let me know if I can assist you in the future.   The limit on tylenol is 3010m per day for pain   [facility] (or come back for flu clinic )   Educated regarding prediabetes and numbers;  A1c ranges from 5.8 to 6.5 or fasting Blood sugar > 115 -126; (126 is diabetic)  A1c 6.6  Your fasting was 102;   Risk: >45yo; family hx; overweight or obese; African American; Hispanic; Latino; [place] history of diabetes when pregnant; or birth to a baby weighing over 9 lbs. Being less physically active than 30 minutes; 3 times a week;   Prevention; Losing a modest 7 to 8 lbs; If over 200 lbs; 10 to 14 lbs;  Choose healthier foods; colorful veggies; fish or lean meats; drinks water Reduce portion size Start exercising; 30 minutes of fast walking x 30 minutes per day/ 60 min for weight loss      These are the goals we discussed: Goals    . patient          Continue to get out of the home 3 to 4 times a week Maybe eat lunch out 2 times a week  [facility]   Try to add some variety to your diet        This is a list of the screening recommended for you and due dates:  Health Maintenance  Topic Date Due  . Complete foot exam   01/06/2016  . Flu Shot  10/30/2016  . Hemoglobin A1C  12/19/2016  . Mammogram  03/28/2017  . Urine Protein Check  06/18/2017  . Eye exam for diabetics  10/07/2017  . Tetanus Vaccine  07/17/2023  . DEXA scan (bone density measurement)  Completed  . Pneumonia vaccines  Completed        Screening for Type 2 Diabetes A screening test for type 2 diabetes (type 2 diabetes mellitus) is a blood test to measure your blood sugar (glucose) level. This test is done to check for early signs of diabetes,  before you develop symptoms. Type 2 diabetes is a long-term (chronic) disease that occurs when the pancreas does not make enough of a hormone called insulin. This results in high blood glucose levels, which can cause many complications. You may be screened for type 2 diabetes as part of your regular health care, especially if you have a high risk for diabetes. Screening can help identify type 2 diabetes at its early stage (prediabetes). Identifying and treating prediabetes may delay or prevent development of type 2 diabetes. What are the risk factors for type 2 diabetes? The following factors may make you more likely to develop type 2 diabetes:  Having a parent or sibling (first-degree relative) who has diabetes.  Being overweight or obese.  Being of American[place], [place], Latino, Asian, or African-American descent.  Not getting enough exercise.  Being older than 448  Having a history of diabetes during pregnancy (gestational diabetes).  Having low levels of good cholesterol (HDL-C) or high levels of blood fats (triglycerides).  Having high blood glucose in a previous blood test.  Having high blood pressure.  Having certain diseases or conditions, including: ? Acanthosis nigricans. This is a condition that causes dark skin  on the neck, armpits, and groin. ? Polycystic ovary syndrome (PCOS). ? Heart disease.  Having delivered a baby who weighed more than 9 lb (4.1 kg).  Who should be screened for type 2 diabetes? Adults  Adults age 73 and older. These adults should be screened at least once every three years.  Adults who are younger than 17, overweight, and have at least one other risk factor. These adults should be screened at least once every three years.  Adults who have normal blood glucose levels and two or more risk factors. These adults may be screened once every year (annually).  Women who have had gestational diabetes in the past. These women  should be screened at least once every three years.  Pregnant women who have risk factors. These women should be screened at their first prenatal visit.  Pregnant women with no risk factors. These women should be screened between weeks 24 and 28 of pregnancy. Children and adolescents  Children and adolescents should be screened for type 2 diabetes if they are overweight and have 2 of the following risk factors: ? A family history of type 2 diabetes. ? Being a member of a high risk race or ethnic group. ? Signs of insulin resistance or conditions associated with insulin resistance. ? A mother who had gestational diabetes while pregnant with him or her.  Screening should be done at least once every three years, starting at age 71. Your health care provider or your child's health care provider may recommend having a screening more or less often. What happens during screening? During screening, your health care provider may ask questions about:  Your health and your risk factors, including your activity level and any medical conditions that you have.  The health of your first-degree relatives.  Past pregnancies, if this applies.  Your health care provider will also do a physical exam, including a blood pressure measurement and blood tests. There are four blood tests that can be used to screen for type 2 diabetes. You may have one or more of the following:  A fasting plasma glucose test (FBG). You will not be allowed to eat for at least eight hours before a blood sample is taken.  A random blood glucose test. This test checks your blood glucose at any time of the day regardless of when you ate.  An oral glucose tolerance test (OGTT). This test measures your blood glucose at two times: ? After you have not eaten (have fasted) overnight. ? Two hours after you drink a glucose-containing beverage. A diagnosis can be made if the level is greater than 200 mg/dL.  An A1c test. This test  provides information about blood glucose control over the previous three months.  What do the results mean? Your test results are a measurement of how much glucose is in your blood. Normal blood glucose levels mean that you do not have diabetes or prediabetes. High blood glucose levels may mean that you have prediabetes or diabetes. Depending on the results, other tests may be needed to confirm the diagnosis. This information is not intended to replace advice given to you by your health care provider. Make sure you discuss any questions you have with your health care provider. Document Released: 01/12/2009 Document Revised: 08/24/2015 Document Reviewed: 01/13/2015 Elsevier Interactive Patient Education  2017 [place] Prevention in the Home Falls can cause injuries. They can happen to people of all ages. There are many things you can do to make your  home safe and to help prevent falls. What can I do on the outside of my home?  Regularly fix the edges of walkways and driveways and fix any cracks.  Remove anything that might make you trip as you walk through a door, such as a raised step or threshold.  Trim any bushes or trees on the path to your home.  Use bright outdoor lighting.  Clear any walking paths of anything that might make someone trip, such as rocks or tools.  Regularly check to see if handrails are loose or broken. Make sure that both sides of any steps have handrails.  Any raised decks and porches should have guardrails on the edges.  Have any leaves, snow, or ice cleared regularly.  Use sand or salt on walking paths during winter.  Clean up any spills in your garage right away. This includes oil or grease spills. What can I do in the bathroom?  Use night lights.  Install grab bars by the toilet and in the tub and shower. Do not use towel bars as grab bars.  Use non-skid mats or decals in the tub or shower.  If you need to sit down in the shower, use a  plastic, non-slip stool.  Keep the floor dry. Clean up any water that spills on the floor as soon as it happens.  Remove soap buildup in the tub or shower regularly.  Attach bath mats securely with double-sided non-slip rug tape.  Do not have throw rugs and other things on the floor that can make you trip. What can I do in the bedroom?  Use night lights.  Make sure that you have a light by your bed that is easy to reach.  Do not use any sheets or blankets that are too big for your bed. They should not hang down onto the floor.  Have a firm chair that has side arms. You can use this for support while you get dressed.  Do not have throw rugs and other things on the floor that can make you trip. What can I do in the kitchen?  Clean up any spills right away.  Avoid walking on wet floors.  Keep items that you use a lot in easy-to-reach places.  If you need to reach something above you, use a strong step stool that has a grab bar.  Keep electrical cords out of the way.  Do not use floor polish or wax that makes floors slippery. If you must use wax, use non-skid floor wax.  Do not have throw rugs and other things on the floor that can make you trip. What can I do with my stairs?  Do not leave any items on the stairs.  Make sure that there are handrails on both sides of the stairs and use them. Fix handrails that are broken or loose. Make sure that handrails are as long as the stairways.  Check any carpeting to make sure that it is firmly attached to the stairs. Fix any carpet that is loose or worn.  Avoid having throw rugs at the top or bottom of the stairs. If you do have throw rugs, attach them to the floor with carpet tape.  Make sure that you have a light switch at the top of the stairs and the bottom of the stairs. If you do not have them, ask someone to add them for you. What else can I do to help prevent falls?  Wear shoes that: ? Do not have  high heels. ? Have  rubber bottoms. ? Are comfortable and fit you well. ? Are closed at the toe. Do not wear sandals.  If you use a stepladder: ? Make sure that it is fully opened. Do not climb a closed stepladder. ? Make sure that both sides of the stepladder are locked into place. ? Ask someone to hold it for you, if possible.  Clearly mark and make sure that you can see: ? Any grab bars or handrails. ? First and last steps. ? Where the edge of each step is.  Use tools that help you move around (mobility aids) if they are needed. These include: ? Canes. ? Walkers. ? Scooters. ? Crutches.  Turn on the lights when you go into a dark area. Replace any light bulbs as soon as they burn out.  Set up your furniture so you have a clear path. Avoid moving your furniture around.  If any of your floors are uneven, fix them.  If there are any pets around you, be aware of where they are.  Review your medicines with your doctor. Some medicines can make you feel dizzy. This can increase your chance of falling. Ask your doctor what other things that you can do to help prevent falls. This information is not intended to replace advice given to you by your health care provider. Make sure you discuss any questions you have with your health care provider. Document Released: 01/12/2009 Document Revised: 08/24/2015 Document Reviewed: 04/22/2014 Elsevier Interactive Patient Education  2018 [place] Maintenance, Female Adopting a healthy lifestyle and getting preventive care can go a long way to promote health and wellness. Talk with your health care provider about what schedule of regular examinations is right for you. This is a good chance for you to check in with your provider about disease prevention and staying healthy. In between checkups, there are plenty of things you can do on your own. Experts have done a lot of research about which lifestyle changes and preventive measures are most likely to keep  you healthy. Ask your health care provider for more information. Weight and diet Eat a healthy diet  Be sure to include plenty of vegetables, fruits, low-fat dairy products, and lean protein.  Do not eat a lot of foods high in solid fats, added sugars, or salt.  Get regular exercise. This is one of the most important things you can do for your health. ? Most adults should exercise for at least 150 minutes each week. The exercise should increase your heart rate and make you sweat (moderate-intensity exercise). ? Most adults should also do strengthening exercises at least twice a week. This is in addition to the moderate-intensity exercise.  Maintain a healthy weight  Body mass index (BMI) is a measurement that can be used to identify possible weight problems. It estimates body fat based on height and weight. Your health care provider can help determine your BMI and help you achieve or maintain a healthy weight.  For females 70 years of age and older: ? A BMI below 18.5 is considered underweight. ? A BMI of 18.5 to 24.9 is normal. ? A BMI of 25 to 29.9 is considered overweight. ? A BMI of 30 and above is considered obese.  Watch levels of cholesterol and blood lipids  You should start having your blood tested for lipids and cholesterol at 81 years of age, then have this test every 5 years.  You may need to have your  cholesterol levels checked more often if: ? Your lipid or cholesterol levels are high. ? You are older than 81 years of age. ? You are at high risk for heart disease.  Cancer screening Lung Cancer  Lung cancer screening is recommended for adults 102-53 years old who are at high risk for lung cancer because of a history of smoking.  A yearly low-dose CT scan of the lungs is recommended for people who: ? Currently smoke. ? Have quit within the past 15 years. ? Have at least a 30-pack-year history of smoking. A pack year is smoking an average of one pack of cigarettes a  day for 1 year.  Yearly screening should continue until it has been 15 years since you quit.  Yearly screening should stop if you develop a health problem that would prevent you from having lung cancer treatment.  Breast Cancer  Practice breast self-awareness. This means understanding how your breasts normally appear and feel.  It also means doing regular breast self-exams. Let your health care provider know about any changes, no matter how small.  If you are in your 20s or 30s, you should have a clinical breast exam (CBE) by a health care provider every 1-3 years as part of a regular health exam.  If you are 60 or older, have a CBE every year. Also consider having a breast X-ray (mammogram) every year.  If you have a family history of breast cancer, talk to your health care provider about genetic screening.  If you are at high risk for breast cancer, talk to your health care provider about having an MRI and a mammogram every year.  Breast cancer gene (BRCA) assessment is recommended for women who have family members with BRCA-related cancers. BRCA-related cancers include: ? Breast. ? Ovarian. ? Tubal. ? Peritoneal cancers.  Results of the assessment will determine the need for genetic counseling and BRCA1 and BRCA2 testing.  Cervical Cancer Your health care provider may recommend that you be screened regularly for cancer of the pelvic organs (ovaries, uterus, and vagina). This screening involves a pelvic examination, including checking for microscopic changes to the surface of your cervix (Pap test). You may be encouraged to have this screening done every 3 years, beginning at age 1.  For women ages 68-65, health care providers may recommend pelvic exams and Pap testing every 3 years, or they may recommend the Pap and pelvic exam, combined with testing for human papilloma virus (HPV), every 5 years. Some types of HPV increase your risk of cervical cancer. Testing for HPV may also be  done on women of any age with unclear Pap test results.  Other health care providers may not recommend any screening for nonpregnant women who are considered low risk for pelvic cancer and who do not have symptoms. Ask your health care provider if a screening pelvic exam is right for you.  If you have had past treatment for cervical cancer or a condition that could lead to cancer, you need Pap tests and screening for cancer for at least 20 years after your treatment. If Pap tests have been discontinued, your risk factors (such as having a new sexual partner) need to be reassessed to determine if screening should resume. Some women have medical problems that increase the chance of getting cervical cancer. In these cases, your health care provider may recommend more frequent screening and Pap tests.  Colorectal Cancer  This type of cancer can be detected and often prevented.  Routine colorectal  cancer screening usually begins at 81 years of age and continues through 81 years of age.  Your health care provider may recommend screening at an earlier age if you have risk factors for colon cancer.  Your health care provider may also recommend using home test kits to check for hidden blood in the stool.  A small camera at the end of a tube can be used to examine your colon directly (sigmoidoscopy or colonoscopy). This is done to check for the earliest forms of colorectal cancer.  Routine screening usually begins at age 29.  Direct examination of the colon should be repeated every 5-10 years through 81 years of age. However, you may need to be screened more often if early forms of precancerous polyps or small growths are found.  Skin Cancer  Check your skin from head to toe regularly.  Tell your health care provider about any new moles or changes in moles, especially if there is a change in a mole's shape or color.  Also tell your health care provider if you have a mole that is larger than the  size of a pencil eraser.  Always use sunscreen. Apply sunscreen liberally and repeatedly throughout the day.  Protect yourself by wearing long sleeves, pants, a wide-brimmed hat, and sunglasses whenever you are outside.  Heart disease, diabetes, and high blood pressure  High blood pressure causes heart disease and increases the risk of stroke. High blood pressure is more likely to develop in: ? People who have blood pressure in the high end of the normal range (130-139/85-89 mm Hg). ? People who are overweight or obese. ? People who are African American.  If you are 82-35 years of age, have your blood pressure checked every 3-5 years. If you are 59 years of age or older, have your blood pressure checked every year. You should have your blood pressure measured twice-once when you are at a hospital or clinic, and once when you are not at a hospital or clinic. Record the average of the two measurements. To check your blood pressure when you are not at a hospital or clinic, you can use: ? An automated blood pressure machine at a pharmacy. ? A home blood pressure monitor.  If you are between 3 years and 21 years old, ask your health care provider if you should take aspirin to prevent strokes.  Have regular diabetes screenings. This involves taking a blood sample to check your fasting blood sugar level. ? If you are at a normal weight and have a low risk for diabetes, have this test once every three years after 81 years of age. ? If you are overweight and have a high risk for diabetes, consider being tested at a younger age or more often. Preventing infection Hepatitis B  If you have a higher risk for hepatitis B, you should be screened for this virus. You are considered at high risk for hepatitis B if: ? You were born in a country where hepatitis B is common. Ask your health care provider which countries are considered high risk. ? Your parents were born in a high-risk country, and you have  not been immunized against hepatitis B (hepatitis B vaccine). ? You have HIV or AIDS. ? You use needles to inject street drugs. ? You live with someone who has hepatitis B. ? You have had sex with someone who has hepatitis B. ? You get hemodialysis treatment. ? You take certain medicines for conditions, including cancer, organ transplantation, and  autoimmune conditions.  Hepatitis C  Blood testing is recommended for: ? Everyone born from [birth year] through 1963. ? Anyone with known risk factors for hepatitis C.  Sexually transmitted infections (STIs)  You should be screened for sexually transmitted infections (STIs) including gonorrhea and chlamydia if: ? You are sexually active and are younger than 81 years of age. ? You are older than 81 years of age and your health care provider tells you that you are at risk for this type of infection. ? Your sexual activity has changed since you were last screened and you are at an increased risk for chlamydia or gonorrhea. Ask your health care provider if you are at risk.  If you do not have HIV, but are at risk, it may be recommended that you take a prescription medicine daily to prevent HIV infection. This is called pre-exposure prophylaxis (PrEP). You are considered at risk if: ? You are sexually active and do not regularly use condoms or know the HIV status of your partner(s). ? You take drugs by injection. ? You are sexually active with a partner who has HIV.  Talk with your health care provider about whether you are at high risk of being infected with HIV. If you choose to begin PrEP, you should first be tested for HIV. You should then be tested every 3 months for as long as you are taking PrEP. Pregnancy  If you are premenopausal and you may become pregnant, ask your health care provider about preconception counseling.  If you may become pregnant, take 400 to 800 micrograms (mcg) of folic acid every day.  If you want to prevent pregnancy, talk  to your health care provider about birth control (contraception). Osteoporosis and menopause  Osteoporosis is a disease in which the bones lose minerals and strength with aging. This can result in serious bone fractures. Your risk for osteoporosis can be identified using a bone density scan.  If you are 68 years of age or older, or if you are at risk for osteoporosis and fractures, ask your health care provider if you should be screened.  Ask your health care provider whether you should take a calcium or vitamin D supplement to lower your risk for osteoporosis.  Menopause may have certain physical symptoms and risks.  Hormone replacement therapy may reduce some of these symptoms and risks. Talk to your health care provider about whether hormone replacement therapy is right for you. Follow these instructions at home:  Schedule regular health, dental, and eye exams.  Stay current with your immunizations.  Do not use any tobacco products including cigarettes, chewing tobacco, or electronic cigarettes.  If you are pregnant, do not drink alcohol.  If you are breastfeeding, limit how much and how often you drink alcohol.  Limit alcohol intake to no more than 1 drink per day for nonpregnant women. One drink equals 12 ounces of beer, 5 ounces of wine, or 1 ounces of hard liquor.  Do not use street drugs.  Do not share needles.  Ask your health care provider for help if you need support or information about quitting drugs.  Tell your health care provider if you often feel depressed.  Tell your health care provider if you have ever been abused or do not feel safe at home. This information is not intended to replace advice given to you by your health care provider. Make sure you discuss any questions you have with your health care provider. Document Released: 10/01/2010 Document Revised: 08/24/2015  Document Reviewed: 12/20/2014 Elsevier Interactive Patient Education  [employer].

## 2016-12-06 ENCOUNTER — Ambulatory Visit: Payer: Medicare Other | Admitting: Sports Medicine

## 2016-12-13 ENCOUNTER — Ambulatory Visit: Payer: Medicare Other | Admitting: Sports Medicine

## 2016-12-18 ENCOUNTER — Encounter: Payer: Self-pay | Admitting: Sports Medicine

## 2016-12-18 ENCOUNTER — Ambulatory Visit (INDEPENDENT_AMBULATORY_CARE_PROVIDER_SITE_OTHER): Payer: Medicare Other | Admitting: Sports Medicine

## 2016-12-18 VITALS — BP 124/62 | HR 68 | Ht 60.0 in | Wt 164.4 lb

## 2016-12-18 DIAGNOSIS — G8929 Other chronic pain: Secondary | ICD-10-CM

## 2016-12-18 DIAGNOSIS — I208 Other forms of angina pectoris: Secondary | ICD-10-CM | POA: Diagnosis not present

## 2016-12-18 DIAGNOSIS — M545 Low back pain: Secondary | ICD-10-CM | POA: Diagnosis not present

## 2016-12-18 NOTE — Progress Notes (Signed)
OFFICE VISIT NOTE Sara Garza. Sara Garza, [place] at [place] - 83 y.o. female MRN [id number]  Date of birth: 1923-05-19  Visit Date: 12/18/2016  PCP: [name], NP   Referred by: [name], NP  [name], CMA acting as scribe for Dr. [name].  SUBJECTIVE:   Chief Complaint  Patient presents with  . Follow-up    low back pain   HPI: As below and per problem based documentation when appropriate.  Sara Garza is an established patient presenting today in follow-up of chronic bilateral low back pain with LT sided sciatica. She was last seen 09/27/2016 and advised to continue with PT and take Tylenol extra strength as needed.   Pt reports continued back pain since her last visit. She has been doing PT without improvement in sx. She has been discharged from PT. She has been taking Tylenol Extra Strength with some relief. She has home exercises from PT that she has not been doing regularly. She continues to have radiation of pain into the LT leg. Leg pain seems to come and go and is worse while walking. She c/o lack of endurance.     Review of Systems  Constitutional: Negative for chills and fever.  Respiratory: Positive for shortness of breath. Negative for wheezing.   Cardiovascular: Positive for leg swelling. Negative for chest pain and palpitations.  Gastrointestinal: Negative.   Genitourinary:       Followed by [facility]  Musculoskeletal: Positive for back pain. Negative for falls.  Neurological: Negative for dizziness, tingling and headaches.  Endo/Heme/Allergies: Bruises/bleeds easily.    Otherwise per HPI.  HISTORY & PERTINENT PRIOR DATA:  Prior History reviewed and updated per electronic medical record. Significant history, findings, studies and interim changes include: No additional findings.  reports that she is a non-smoker but has been exposed to tobacco smoke. she has never  used smokeless tobacco. Recent Labs    06/18/16 0945  HGBA1C 6.6*   No problems updated.  OBJECTIVE:  VS:  HT:5' (152.4 cm)   WT:164 lb 6.4 oz (74.6 kg)  BMI:32.11    BP:124/62  HR:68bpm  TEMP: ( )  RESP:94 %  PHYSICAL EXAM: Constitutional: WDWN, Non-toxic appearing. Psychiatric: Alert & appropriately interactive. Not depressed or anxious appearing. Respiratory: No increased work of breathing. Trachea Midline Eyes: Pupils are equal. EOM intact without nystagmus. No scleral icterus  Lower extremities overall well aligned.  She does have a slight amount of genu valgus this is fairly mild.  She has good internal and external rotation of bilateral hips.  Some pain with straight leg raise worse on the right than on the left.    ASSESSMENT & PLAN:   1. Chronic bilateral low back pain, with sciatica presence unspecified    Plan: Patient has had significant worsening of her symptoms over the past several years.  She does have some underlying changes of her lumbar spine based on x-rays obtained earlier this year.  I would like to have her undergo a diagnostic and therapeutic epidural steroid injection and place referral for this.  I will plan to follow-up with her as outlined below.  She has continued to show good progress with physical therapy but has significant limitations in her mobility.  No problem-specific Assessment & Plan notes found for this encounter.   ++++++++++++++++++++++++++++++++++++++++++++ Orders:  Orders Placed This Encounter  Procedures  . Ambulatory referral to Physical Medicine Rehab    Meds:  No orders of the defined types were placed in this encounter.   ++++++++++++++++++++++++++++++++++++++++++++ Follow-up: Return in about 8 weeks (around 02/12/2017).   Pertinent documentation may be included in additional procedure notes, imaging studies, problem based documentation and patient instructions. Please see these sections of the encounter for additional  information regarding this visit. CMA/ATC served as Education administrator during this visit. History, Physical, and Plan performed by medical provider. Documentation and orders reviewed and attested to.      Gerda Diss, [facility] Physician

## 2016-12-31 ENCOUNTER — Ambulatory Visit (INDEPENDENT_AMBULATORY_CARE_PROVIDER_SITE_OTHER): Payer: Medicare Other

## 2016-12-31 ENCOUNTER — Ambulatory Visit (INDEPENDENT_AMBULATORY_CARE_PROVIDER_SITE_OTHER): Payer: Medicare Other | Admitting: Physical Medicine and Rehabilitation

## 2016-12-31 VITALS — BP 154/67 | HR 70 | Temp 97.9°F

## 2016-12-31 DIAGNOSIS — M5416 Radiculopathy, lumbar region: Secondary | ICD-10-CM

## 2016-12-31 MED ORDER — LIDOCAINE HCL (PF) 1 % IJ SOLN
2.0000 mL | Freq: Once | INTRAMUSCULAR | Status: AC
  Administered 2016-12-31: 2 mL

## 2016-12-31 MED ORDER — BETAMETHASONE SOD PHOS & ACET 6 (3-3) MG/ML IJ SUSP
12.0000 mg | Freq: Once | INTRAMUSCULAR | Status: AC
  Administered 2016-12-31: 12 mg

## 2016-12-31 NOTE — Patient Instructions (Signed)

## 2016-12-31 NOTE — Progress Notes (Deleted)
Patient here today, referred by Dr. [name] for lower back pain. She has bilateral radiculopathy, with sciatica pain aswell.

## 2017-01-01 ENCOUNTER — Ambulatory Visit: Payer: Medicare Other

## 2017-01-01 NOTE — Procedures (Signed)
[name] is a 83 year old female under the care of Dr. [name] with worsening back pain and radicular left more than right leg pain. Xrays showing listhesis at L4-5.  Here today for diagnostic Left L5-S1 interlaminar epidural injection.  Lumbar Epidural Steroid Injection - Interlaminar Approach with Fluoroscopic Guidance  Patient: Sara Garza      Date of Birth: [birth year] MRN: [id number] PCP: [name], NP      Visit Date: 12/31/2016   Universal Protocol:     Consent Given By: the patient  Position: PRONE  Additional Comments: Vital signs were monitored before and after the procedure. Patient was prepped and draped in the usual sterile fashion. The correct patient, procedure, and site was verified.   Injection Procedure Details:  Procedure Site One Meds Administered:  Meds ordered this encounter  Medications  . lidocaine (PF) (XYLOCAINE) 1 % injection 2 mL  . betamethasone acetate-betamethasone sodium phosphate (CELESTONE) injection 12 mg     Laterality: Left  Location/Site:  L5-S1  Needle size: 20 G  Needle type: Tuohy  Needle Placement: Paramedian epidural  Findings:  -Contrast Used: 1 mL iohexol 180 mg iodine/mL   -Comments: Excellent flow of contrast into the epidural space.  Procedure Details: Using a paramedian approach from the side mentioned above, the region overlying the inferior lamina was localized under fluoroscopic visualization and the soft tissues overlying this structure were infiltrated with 4 ml. of 1% Lidocaine without Epinephrine. The Tuohy needle was inserted into the epidural space using a paramedian approach.   The epidural space was localized using loss of resistance along with lateral and bi-planar fluoroscopic views.  After negative aspirate for air, blood, and CSF, a 2 ml. volume of Isovue-250 was injected into the epidural space and the flow of contrast was observed. Radiographs were obtained for documentation purposes.    The  injectate was administered into the level noted above.   Additional Comments:  The patient tolerated the procedure well Dressing: Band-Aid    Post-procedure details: Patient was observed during the procedure. Post-procedure instructions were reviewed.  Patient left the clinic in stable condition.

## 2017-01-07 ENCOUNTER — Ambulatory Visit (INDEPENDENT_AMBULATORY_CARE_PROVIDER_SITE_OTHER): Payer: Medicare Other | Admitting: *Deleted

## 2017-01-07 ENCOUNTER — Telehealth: Payer: Self-pay | Admitting: Adult Health

## 2017-01-07 DIAGNOSIS — R32 Unspecified urinary incontinence: Secondary | ICD-10-CM

## 2017-01-07 DIAGNOSIS — Z23 Encounter for immunization: Secondary | ICD-10-CM

## 2017-01-07 MED ORDER — MIRABEGRON ER 25 MG PO TB24: 25.0000 mg | ORAL_TABLET | Freq: Every day | ORAL | 2 refills | Status: DC

## 2017-01-07 NOTE — Telephone Encounter (Signed)
Assessment & Plan:  1. Urinary incontinence, unspecified type - I am ok with her taking Myrbetriq. Advised not to go above 25 mg due to CKD  - mirabegron ER (MYRBETRIQ) 25 MG TB24 tablet; Take 25 mg by mouth daily.  2. Essential hypertension  - carvedilol (COREG) 3.125 MG tablet; Take 1 tablet (3.125 mg total) by mouth 2 (two) times daily with a meal.  Dispense: 180 tablet; Refill: [place], NP    Sent to the pharmacy by e-scribe.

## 2017-01-07 NOTE — Telephone Encounter (Signed)
Pt is needing new Rx for mirabegron ER  (MYRBETRIQ) 25 MG   Pharm:  [facility] that this medication is working well for her.

## 2017-01-16 ENCOUNTER — Other Ambulatory Visit: Payer: Self-pay | Admitting: Cardiovascular Disease

## 2017-01-16 DIAGNOSIS — I10 Essential (primary) hypertension: Secondary | ICD-10-CM

## 2017-01-16 DIAGNOSIS — I359 Nonrheumatic aortic valve disorder, unspecified: Secondary | ICD-10-CM

## 2017-01-16 DIAGNOSIS — I25119 Atherosclerotic heart disease of native coronary artery with unspecified angina pectoris: Secondary | ICD-10-CM

## 2017-02-11 DIAGNOSIS — L218 Other seborrheic dermatitis: Secondary | ICD-10-CM | POA: Diagnosis not present

## 2017-02-11 DIAGNOSIS — B078 Other viral warts: Secondary | ICD-10-CM | POA: Diagnosis not present

## 2017-02-12 ENCOUNTER — Ambulatory Visit (INDEPENDENT_AMBULATORY_CARE_PROVIDER_SITE_OTHER): Payer: Medicare Other | Admitting: Sports Medicine

## 2017-02-12 ENCOUNTER — Encounter: Payer: Self-pay | Admitting: Sports Medicine

## 2017-02-12 VITALS — BP 148/70 | HR 68 | Ht 60.0 in | Wt 164.4 lb

## 2017-02-12 DIAGNOSIS — M25512 Pain in left shoulder: Secondary | ICD-10-CM | POA: Diagnosis not present

## 2017-02-12 DIAGNOSIS — N183 Chronic kidney disease, stage 3 unspecified: Secondary | ICD-10-CM

## 2017-02-12 DIAGNOSIS — M25511 Pain in right shoulder: Secondary | ICD-10-CM

## 2017-02-12 DIAGNOSIS — I10 Essential (primary) hypertension: Secondary | ICD-10-CM | POA: Diagnosis not present

## 2017-02-12 DIAGNOSIS — G8929 Other chronic pain: Secondary | ICD-10-CM | POA: Diagnosis not present

## 2017-02-12 DIAGNOSIS — M545 Low back pain: Secondary | ICD-10-CM

## 2017-02-12 DIAGNOSIS — N3946 Mixed incontinence: Secondary | ICD-10-CM

## 2017-02-12 DIAGNOSIS — M6281 Muscle weakness (generalized): Secondary | ICD-10-CM | POA: Diagnosis not present

## 2017-02-12 DIAGNOSIS — R32 Unspecified urinary incontinence: Secondary | ICD-10-CM | POA: Insufficient documentation

## 2017-02-12 DIAGNOSIS — I208 Other forms of angina pectoris: Secondary | ICD-10-CM | POA: Diagnosis not present

## 2017-02-12 MED ORDER — PREDNISONE 10 MG PO TABS: 10.0000 mg | ORAL_TABLET | Freq: Every day | ORAL | 0 refills | Status: DC

## 2017-02-12 NOTE — Assessment & Plan Note (Signed)
She has been tried on  Myrbetriq and has shown some mild improvement in her symptoms but has also noted some  increase in her blood pressure.  Will need to continue to monitor her blood pressure with this medicine.  We will also need to monitor BP with addition of corticosteroid but once again we will try to keep low dose.  She will need lab monitoring if  we continue her on corticosteroids

## 2017-02-12 NOTE — Progress Notes (Signed)
OFFICE VISIT NOTE Sara Garza. Sara Garza, [place] at [place] - 83 y.o. female MRN [id number]  Date of birth: 06/13/1923  Visit Date: 02/12/2017  PCP: Sara Peng, NP   Referred by: Sara Peng, NP  Sara Garza PT, LAT, ATC acting as scribe for Dr. Paulla Garza.  SUBJECTIVE:   Chief Complaint  Patient presents with  . Follow-up    Low back pain w/ sciatica   HPI: As below and per problem based documentation when appropriate.  Sara Garza is an established pt presenting today for f/u of her chronic LBP w/ L LE radicular symptoms.  She was last seen by Dr. Paulla Garza on 12/18/16 and then saw Dr. Ernestina Garza for a lumbar epidural injections on 12/31/16.  Pt states that her symptoms have not changed since she was last seen w/ no improvement including no improvement after her epidural.  Pt states that she has the most pain w/ transitioning from sitting to standing or lying to standing.  Pt has no pain at rest but notes 6/10 pain w/ transitions.    Review of Systems  Constitutional: Negative for chills, fever and weight loss.  HENT: Negative.   Eyes: Negative.   Respiratory: Positive for shortness of breath. Negative for cough and wheezing.   Cardiovascular: Negative for chest pain and palpitations.  Gastrointestinal: Negative for abdominal pain, heartburn and nausea.  Musculoskeletal: Positive for back pain. Negative for falls.  Neurological: Negative for dizziness, tingling and headaches.  Endo/Heme/Allergies: Bruises/bleeds easily.  Psychiatric/Behavioral: Negative for depression. The patient is not nervous/anxious and does not have insomnia.     Otherwise per HPI.  HISTORY & PERTINENT PRIOR DATA:  No specialty comments available. She reports that she is a non-smoker but has been exposed to tobacco smoke. she has never used smokeless tobacco.  Recent Labs    06/18/16 0945  HGBA1C 6.6*   Allergies reviewed per EMR Prior  to Admission medications   Medication Sig Start Date End Date Taking? Authorizing Provider  amLODipine (NORVASC) 5 MG tablet TAKE 1 TABLET(5 MG) BY MOUTH DAILY 10/23/16  Yes Sara Garza, Sara Rumps, NP  amoxicillin (AMOXIL) 500 MG capsule  12/10/16  Yes [provider]  aspirin EC 81 MG tablet Take 1 tablet (81 mg total) by mouth daily. Start after lovenox injections are done after 2 weeks 08/03/13  Yes Sara Garza, Sara K, MD  bimatoprost (LUMIGAN) 0.01 % SOLN Place 1 drop into both eyes at bedtime.    Yes [provider]  Calcium Carb-Cholecalciferol (CALCIUM 500 +D) 500-400 MG-UNIT TABS Take 1 tablet by mouth daily.    Yes [provider]  carvedilol (COREG) 3.125 MG tablet Take 1 tablet (3.125 mg total) by mouth 2 (two) times daily with a meal. 11/20/16 02/18/17 Yes Sara Garza, Sara Rumps, NP  ezetimibe (ZETIA) 10 MG tablet TAKE 1 TABLET(10 MG) BY MOUTH DAILY 01/16/17  Yes Sara Mocha, MD  furosemide (LASIX) 20 MG tablet Take 0.5 tablets (10 mg total) by mouth daily. 07/11/16  Yes Sara Mocha, MD  isosorbide mononitrate (IMDUR) 60 MG 24 hr tablet TAKE 1 TABLET(60 MG) BY MOUTH DAILY 01/16/17  Yes Sara Mocha, MD  mirabegron ER (MYRBETRIQ) 25 MG TB24 tablet Take 1 tablet (25 mg total) by mouth daily. 01/07/17  Yes Sara Garza, Sara Rumps, NP  Multiple Vitamin (MULTIVITAMIN) tablet Take 1 tablet by mouth daily.     Yes [provider]  predniSONE (DELTASONE) 10 MG tablet Take 1 tablet (10 mg  total) daily with breakfast by mouth. 02/12/17   Sara Diss, DO   Patient Active Problem List   Diagnosis Date Noted  . Urinary incontinence 02/12/2017  . Chronic bilateral low back pain 08/12/2016  . Spondylosis of lumbar joint 08/12/2016  . Bilateral leg pain 08/12/2016  . Neck mass 04/15/2015  . Status post hip hemiarthroplasty 07/31/2013  . CAD (coronary artery disease) 07/31/2013  . CKD (chronic kidney disease), stage III ([facility]) 07/31/2013  . Left bundle branch block 07/31/2013  .  Severe calcific aortic stenosis 06/18/2010  . Diabetic polyneuropathy ([facility]) 04/16/2010  . GLAUCOMA 01/06/2009  . CHEST PAIN, ATYPICAL 12/16/2008  . Bilateral shoulder pain 11/16/2007  . OSTEOARTHRITIS 02/02/2007  . Type 2 diabetes mellitus with renal manifestations, controlled ([facility]) 10/24/2006  . Dyslipidemia 10/02/2006  . Essential hypertension 10/02/2006   Past Medical History:  Diagnosis Date  . Arthritis   . CAD (coronary artery disease)   . Carotid stenosis, left    50-60% stable  . Diabetes mellitus   . Glaucoma   . Hyperlipidemia   . Hypertension   . Leg pain    ABIs 2/18: normal bilaterally.  . Osteoporosis    Family History  Problem Relation Age of Onset  . COPD Father   . Cancer Father        lung cancer  . Lung cancer Unknown    Past Surgical History:  Procedure Laterality Date  . ANOMALOUS PULMONARY VENOUS RETURN REPAIR, TOTAL    . APPENDECTOMY    . CARDIAC VALVE REPLACEMENT  04/29/2012  . Cataracts Bilateral   . CESAREAN SECTION    . FOOT SURGERY     Sara Garza   Social History   Occupational History  . Not on file  Tobacco Use  . Smoking status: Passive Smoke Exposure - Never Smoker  . Smokeless tobacco: Never Used  Substance and Sexual Activity  . Alcohol use: Yes    Alcohol/week: 0.0 oz    Comment: very seldom  . Drug use: No  . Sexual activity: Not Currently    OBJECTIVE:  VS:  HT:5' (152.4 cm)   WT:164 lb 6.4 oz (74.6 kg)  BMI:32.11    BP:(!) 148/70  HR:68bpm  TEMP: ( )  RESP:96 % EXAM: Findings:  Patient walks with an antalgic gait and is wide-based.  Her sit to stand test is less than 5 seconds but is worse than in the past.  She does have hip girdle and shoulder girdle pain to palpation.    RADIOLOGY: Epidural Steroid injection Sara Sinning, MD     01/01/2017  4:55 AM Sara Garza is a 83 year old female under the care of Dr. Paulla Garza  with worsening back pain and radicular left more than right leg  pain. Xrays showing  listhesis at L4-5.  Here today for diagnostic  Left L5-S1 interlaminar epidural injection.  Lumbar Epidural Steroid Injection - Interlaminar Approach with  Fluoroscopic Guidance  Patient: Sara Garza      Date of Birth: 1923-10-30 MRN: [id number] PCP: Sara Peng, NP      Visit Date: 12/31/2016   Universal Protocol:     Consent Given By: the patient  Position: PRONE  Additional Comments: Vital signs were monitored before and after the procedure. Patient was prepped and draped in the usual sterile fashion. The correct patient, procedure, and site was verified.  Injection Procedure Details:  Procedure Site One Meds Administered:  Meds ordered this encounter  Medications  . lidocaine (PF) (XYLOCAINE) 1 %  injection 2 mL  . betamethasone acetate-betamethasone sodium phosphate  (CELESTONE) injection 12 mg     Laterality: Left  Location/Site:  L5-S1  Needle size: 20 G  Needle type: Tuohy  Needle Placement: Paramedian epidural  Findings:  -Contrast Used: 1 mL iohexol 180 mg iodine/mL   -Comments: Excellent flow of contrast into the epidural space.  Procedure Details: Using a paramedian approach from the side mentioned above, the  region overlying the inferior lamina was localized under  fluoroscopic visualization and the soft tissues overlying this  structure were infiltrated with 4 ml. of 1% Lidocaine without  Epinephrine. The Tuohy needle was inserted into the epidural  space using a paramedian approach.   The epidural space was localized using loss of resistance along  with lateral and bi-planar fluoroscopic views.  After negative  aspirate for air, blood, and CSF, a 2 ml. volume of Isovue-250  was injected into the epidural space and the flow of contrast was  observed. Radiographs were obtained for documentation purposes.    The injectate was administered into the level noted above.  Additional Comments:  The patient tolerated the procedure  well Dressing: Band-Aid    Post-procedure details: Patient was observed during the procedure. Post-procedure instructions were reviewed.  Patient left the clinic in stable condition.  ASSESSMENT & PLAN:     ICD-10-CM   1. Chronic bilateral low back pain, with sciatica presence unspecified M54.5    G89.29   2. Muscle weakness (generalized) M62.81   3. Chronic pain of both shoulders M25.511    G89.29    M25.512   4. Essential hypertension I10   5. CKD (chronic kidney disease), stage III ([facility]) N18.3   6. Mixed stress and urge urinary incontinence N39.46    ================================================================= Bilateral shoulder pain Patient has had issues with both shoulder and back/hip girdle discomfort for quite some time worsening over the past 18 months.  She had a minimal improvement following the epidural steroid injection that may have been steroid related.  Question whether or not this may be a presentation of Mild case of polymyalgia rheumatica.  If this is PMR I would anticipate a fairly substantial improvement with systemic corticosteroids even over a very short period like for her to try 3 days of 10 mg prednisone to see if she has substantial improvement if she does this could be considered to be continued but would obviously need further lab monitoring and drug titration.  If any lack of improvement will plan to discontinue and could consider further diagnostic evaluation with MRI of the lumbar spine.  >50% of this 25 minute visit spent in direct patient counseling and/or coordination of care.  Discussion was focused on education regarding the in discussing the pathoetiology and anticipated clinical course of the above condition.   Urinary incontinence She has been tried on  Myrbetriq and has shown some mild improvement in her symptoms but has also noted some  increase in her blood pressure.  Will need to continue to monitor her blood pressure with this medicine.   We will also need to monitor BP with addition of corticosteroid but once again we will try to keep low dose.  She will need lab monitoring if  we continue her on corticosteroids  No notes on file ================================================================= Patient Instructions  Take 10mg  of Predisone on Wednesday, Thursday, Friday and Saturday. Don't take it on Sunday Call me Monday and let me know how you are feeling  =================================================================  Follow-up: Return in about 4  weeks (around 03/12/2017).   CMA/ATC served as Education administrator during this visit. History, Physical, and Plan performed by medical provider. Documentation and orders reviewed and attested to.      Teresa Coombs, [facility] Physician

## 2017-02-12 NOTE — Patient Instructions (Signed)
Take 10mg  of Predisone on Wednesday, Thursday, Friday and Saturday. Don't take it on Sunday Call me Monday and let me know how you are feeling

## 2017-02-12 NOTE — Assessment & Plan Note (Signed)
Patient has had issues with both shoulder and back/hip girdle discomfort for quite some time worsening over the past 18 months.  She had a minimal improvement following the epidural steroid injection that may have been steroid related.  Question whether or not this may be a presentation of Mild case of polymyalgia rheumatica.  If this is PMR I would anticipate a fairly substantial improvement with systemic corticosteroids even over a very short period like for her to try 3 days of 10 mg prednisone to see if she has substantial improvement if she does this could be considered to be continued but would obviously need further lab monitoring and drug titration.  If any lack of improvement will plan to discontinue and could consider further diagnostic evaluation with MRI of the lumbar spine.  >50% of this 25 minute visit spent in direct patient counseling and/or coordination of care.  Discussion was focused on education regarding the in discussing the pathoetiology and anticipated clinical course of the above condition.

## 2017-02-13 DIAGNOSIS — M1990 Unspecified osteoarthritis, unspecified site: Secondary | ICD-10-CM | POA: Insufficient documentation

## 2017-02-13 DIAGNOSIS — H409 Unspecified glaucoma: Secondary | ICD-10-CM | POA: Insufficient documentation

## 2017-02-13 DIAGNOSIS — E785 Hyperlipidemia, unspecified: Secondary | ICD-10-CM | POA: Insufficient documentation

## 2017-02-13 DIAGNOSIS — M79606 Pain in leg, unspecified: Secondary | ICD-10-CM | POA: Insufficient documentation

## 2017-02-13 DIAGNOSIS — I6522 Occlusion and stenosis of left carotid artery: Secondary | ICD-10-CM | POA: Insufficient documentation

## 2017-02-13 DIAGNOSIS — I10 Essential (primary) hypertension: Secondary | ICD-10-CM | POA: Insufficient documentation

## 2017-02-17 ENCOUNTER — Telehealth: Payer: Self-pay | Admitting: Sports Medicine

## 2017-02-17 NOTE — Telephone Encounter (Signed)
Patient called to advise that she refuses to take the predniSONE (DELTASONE) 10 MG tablet due to it causing her to not sleep well. Patient stated that there was no need for clinical staff to call back.

## 2017-02-17 NOTE — Telephone Encounter (Signed)
Acknowledged, okay with her discontinuing this as it was for both diagnostic and therapeutic purposes and if it is causing more issues definitely not appropriate.

## 2017-02-17 NOTE — Telephone Encounter (Signed)
Will forward to Dr. [name] as Juluis Rainier

## 2017-02-26 ENCOUNTER — Encounter: Payer: Self-pay | Admitting: Cardiovascular Disease

## 2017-02-26 ENCOUNTER — Ambulatory Visit (INDEPENDENT_AMBULATORY_CARE_PROVIDER_SITE_OTHER): Payer: Medicare Other | Admitting: Cardiovascular Disease

## 2017-02-26 VITALS — BP 132/48 | HR 75 | Ht 60.0 in | Wt 164.6 lb

## 2017-02-26 DIAGNOSIS — I359 Nonrheumatic aortic valve disorder, unspecified: Secondary | ICD-10-CM | POA: Diagnosis not present

## 2017-02-26 DIAGNOSIS — I5032 Chronic diastolic (congestive) heart failure: Secondary | ICD-10-CM

## 2017-02-26 DIAGNOSIS — I208 Other forms of angina pectoris: Secondary | ICD-10-CM | POA: Diagnosis not present

## 2017-02-26 DIAGNOSIS — I2510 Atherosclerotic heart disease of native coronary artery without angina pectoris: Secondary | ICD-10-CM | POA: Diagnosis not present

## 2017-02-26 NOTE — Progress Notes (Signed)
Cardiology Office Note Date:  02/26/2017   ID:  Sara Garza, DOB September 19, 1923, MRN [id number]  PCP:  [name], NP  Cardiologist:  Sherren Mocha, MD    Chief Complaint  Patient presents with  . Dizziness     History of Present Illness: Sara Garza is a 83 y.o. female who presents for follow-up of CAD, aortic stenosis, and chronic diastolic heart failure.   She underwent for [name] for treatment of severe symptomatic aortic stenosis in 2014. She was treated with a 23 mm [employer]tronic Corevalve through the moderate risk SurTAVI aortic stenosis trial. She also is followed for moderate coronary artery disease which was evaluated with pressure wire analysis of the RCA and LCx demonstrating no hemodynamic evidence of flow-obstruction. 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   The patient is here alone today.  She complains of increasing gait instability.  She initially complained of dizziness, but states that her symptoms are more like an unsteadiness.  She has not had presyncope or frank syncope.  She has had 2 falls.  Denies chest pain.  Complains of shortness of breath with activity, slowly progressive.  No orthopnea or PND.  She does complain of leg swelling.   Past Medical History:  Diagnosis Date  . Arthritis   . CAD (coronary artery disease)   . Carotid stenosis, left    50-60% stable  . Diabetes mellitus   . Glaucoma   . Hyperlipidemia   . Hypertension   . Leg pain    ABIs 2/18: normal bilaterally.  . Osteoporosis     Past Surgical History:  Procedure Laterality Date  . ANOMALOUS PULMONARY VENOUS RETURN REPAIR, TOTAL    . APPENDECTOMY    . CARDIAC VALVE REPLACEMENT  04/29/2012  . Cataracts Bilateral   . CESAREAN SECTION    . FOOT SURGERY     [name]  . HIP ARTHROPLASTY Right 07/31/2013   Procedure: ARTHROPLASTY BIPOLAR HIP;  Surgeon: [name], MD;   Location: [facility];  Service: Orthopedics;  Laterality: Right;  . PERCUTANEOUS CORONARY STENT INTERVENTION (PCI-S) N/A 01/02/2012   Procedure: PERCUTANEOUS CORONARY STENT INTERVENTION (PCI-S);  Surgeon: Sherren Mocha, MD;  Location: [facility] CATH LAB;  Service: Cardiovascular;  Laterality: N/A;    Current Outpatient Medications  Medication Sig Dispense Refill  . amLODipine (NORVASC) 5 MG tablet TAKE 1 TABLET(5 MG) BY MOUTH DAILY 90 tablet 2  . amoxicillin (AMOXIL) 500 MG capsule Take 2,000 mg by mouth as directed. 1 hour prior to dental procedures  1  . aspirin EC 81 MG tablet Take 1 tablet (81 mg total) by mouth daily. Start after lovenox injections are done after 2 weeks    . bimatoprost (LUMIGAN) 0.01 % SOLN Place 1 drop into both eyes at bedtime.     . Calcium Carb-Cholecalciferol (CALCIUM 500 +D) 500-400 MG-UNIT TABS Take 1 tablet by mouth daily.     . carvedilol (COREG) 3.125 MG tablet Take 3.125 mg by mouth 2 (two) times daily with a meal.    . ezetimibe (ZETIA) 10 MG tablet TAKE 1 TABLET(10 MG) BY MOUTH DAILY 30 tablet 5  . furosemide (LASIX) 20 MG tablet Take 0.5 tablets (10 mg total) by mouth daily. 45 tablet 3  . isosorbide mononitrate (IMDUR) 60 MG 24 hr tablet TAKE 1 TABLET(60 MG) BY MOUTH DAILY 30 tablet 5  . mirabegron ER (MYRBETRIQ) 25 MG  TB24 tablet Take 1 tablet (25 mg total) by mouth daily. 90 tablet 2  . Multiple Vitamin (MULTIVITAMIN) tablet Take 1 tablet by mouth daily.       No current facility-administered medications for this visit.     Allergies:   Statins and Gabapentin   Social History:  The patient  reports that she is a non-smoker but has been exposed to tobacco smoke. she has never used smokeless tobacco. She reports that she drinks alcohol. She reports that she does not use drugs.   Family History:  The patient's family history includes COPD in her father; Cancer in her father; Lung cancer in her unknown relative.    ROS:  Please see the history of present  illness.  Otherwise, review of systems is positive for hearing loss, visual disturbance, back pain, muscle pain, balance problems, fatigue.  All other systems are reviewed and negative.    PHYSICAL EXAM: VS:  BP (!) 132/48   Pulse 75   Ht 5' (1.524 m)   Wt 164 lb 9.6 oz (74.7 kg)   BMI 32.15 kg/m  , BMI Body mass index is 32.15 kg/m. GEN: pleasant elderly woman, in no acute distress  HEENT: normal  Neck: no JVD, no masses. No carotid bruits Cardiac: RRR with 2/6 harsh systolic murmur at the RUSB, no diastolic murmur appreciable              Respiratory:  clear to auscultation bilaterally, normal work of breathing GI: soft, nontender, nondistended, + BS MS: no deformity or atrophy  Ext: no pretibial edema Skin: warm and dry, no rash Neuro:  Strength and sensation are intact Psych: euthymic mood, full affect  EKG:  EKG is ordered today. The ekg ordered today shows normal sinus rhythm 78 bpm, left bundle branch block, no significant change from previous tracings.  Recent Labs: 06/02/2016: B Natriuretic Peptide 276.6 06/18/2016: ALT 10; BUN 54; Creatinine, Ser 1.71; Hemoglobin 11.9; Platelets 218.0; Potassium 4.8; Sodium 141; TSH 1.63   Lipid Panel     Component Value Date/Time   CHOL 233 (H) 06/18/2016 0945   TRIG 136.0 06/18/2016 0945   HDL 45.10 06/18/2016 0945   CHOLHDL 5 06/18/2016 0945   VLDL 27.2 06/18/2016 0945   LDLCALC 161 (H) 06/18/2016 0945   LDLDIRECT 158.6 10/23/2012 1223      Wt Readings from Last 3 Encounters:  02/26/17 164 lb 9.6 oz (74.7 kg)  02/12/17 164 lb 6.4 oz (74.6 kg)  12/18/16 164 lb 6.4 oz (74.6 kg)     Cardiac Studies Reviewed: Echo 07/05/2016: Study Conclusions  - Left ventricle: The cavity size was normal. Wall thickness was   increased in a pattern of moderate LVH. There was severe   concentric hypertrophy. Systolic function was vigorous. The   estimated ejection fraction was in the range of 65% to 70%. Wall   motion was normal; there  were no regional wall motion   abnormalities. The study is not technically sufficient to allow   evaluation of LV diastolic function. - Aortic valve: [name] valve in place. No obstruction. Trivial to   mild paravalvular leak. Mean gradient (S): 13 mm Hg. Peak   gradient (S): 26 mm Hg. Valve area (VTI): 1.42 cm^2. Valve area   (Vmax): 1.49 cm^2. Valve area (Vmean): 1.62 cm^2. - Mitral valve: Heavy MAC. Mild to moderate stenosis. There was   mild regurgitation. Valve area by pressure half-time: 2.44 cm^2.   Valve area by continuity equation (using LVOT flow): 1.51 cm^2. -  Left atrium: Severely dilated. - Tricuspid valve: There was trivial regurgitation. - Inferior vena cava: The vessel was normal in size. The   respirophasic diameter changes were in the normal range (= 50%),   consistent with normal central venous pressure. - Pericardium, extracardiac: A trivial pericardial effusion was   identified.  Impressions:  - Compared to a prior study in 05/2015, the [name] valve appears to be   functioning normally. I do not appreciate any significant AI.   There is a trivial to mild paravalvular leak. There is heavy MAC   with mild to moderate MS - perhaps the acceleration jet from MS   was mistaken to be an AI jet.   ASSESSMENT AND PLAN: 1.  Acute on chronic diastolic heart failure: Patient continues to be fairly symptomatic.  I suspect there is multitude of issues including advanced age, obesity, aortic valve disease, mitral valve stenosis, coronary disease, and deconditioning.  I do not think there is anything related to her medical therapy that we can improve upon much.  She is tolerating a tiny dose of Lasix but does not feel she can take any more of those because of urinary incontinence.  Will continue her current medicines and see her back in 6 months with an echocardiogram prior to the visit.  2.  Aortic valve disease: Patient status post [name] with mild paravalvular regurgitation on her  most recent echo study.  Continue clinical follow-up with an echo in 6 months.  3.  Coronary artery disease, native vessel, with angina: Continue amlodipine, carvedilol, and isosorbide for antianginal therapy.  She is not interested in pursuing further invasive procedures at her age.  We will do our best to manage her medically.  4.  Hypertension: Continue amlodipine, carvedilol, isosorbide, and low-dose furosemide.  She has a wide pulse pressure.  Current medicines are reviewed with the patient today.  The patient does not have concerns regarding medicines.  Labs/ tests ordered today include:   Orders Placed This Encounter  Procedures  . EKG 12-Lead  . ECHOCARDIOGRAM COMPLETE    Disposition:   FU 6 months with an echo prior to the visit. No medication changes today.  Signed, Sherren Mocha, MD  02/26/2017 5:16 PM    [address], [place], [place]  [address] Phone: [phone]; Fax: [phone]

## 2017-02-26 NOTE — Patient Instructions (Signed)
Medication Instructions:  Your provider recommends that you continue on your current medications as directed. Please refer to the Current Medication list given to you today.    Labwork: None  Testing/Procedures: Your provider has requested that you have an echocardiogram in 6 months. Echocardiography is a painless test that uses sound waves to create images of your heart. It provides your doctor with information about the size and shape of your heart and how well your heart's chambers and valves are working. This procedure takes approximately one hour. There are no restrictions for this procedure.  Follow-Up: Your provider wants you to follow-up in: 6 months with Dr. Cooper. You will receive a reminder letter in the mail two months in advance. If you don't receive a letter, please call our office to schedule the follow-up appointment.    Any Other Special Instructions Will Be Listed Below (If Applicable).     If you need a refill on your cardiac medications before your next appointment, please call your pharmacy.   

## 2017-03-12 ENCOUNTER — Ambulatory Visit: Payer: Medicare Other | Admitting: Sports Medicine

## 2017-04-03 DIAGNOSIS — H04123 Dry eye syndrome of bilateral lacrimal glands: Secondary | ICD-10-CM | POA: Diagnosis not present

## 2017-04-03 DIAGNOSIS — H401132 Primary open-angle glaucoma, bilateral, moderate stage: Secondary | ICD-10-CM | POA: Diagnosis not present

## 2017-04-03 DIAGNOSIS — E119 Type 2 diabetes mellitus without complications: Secondary | ICD-10-CM | POA: Diagnosis not present

## 2017-04-03 DIAGNOSIS — H353132 Nonexudative age-related macular degeneration, bilateral, intermediate dry stage: Secondary | ICD-10-CM | POA: Diagnosis not present

## 2017-04-03 LAB — HM DIABETES EYE EXAM

## 2017-04-08 ENCOUNTER — Encounter: Payer: Self-pay | Admitting: Family Medicine

## 2017-04-14 DIAGNOSIS — N3946 Mixed incontinence: Secondary | ICD-10-CM | POA: Diagnosis not present

## 2017-04-14 DIAGNOSIS — Z87440 Personal history of urinary (tract) infections: Secondary | ICD-10-CM | POA: Diagnosis not present

## 2017-04-23 DIAGNOSIS — Z1231 Encounter for screening mammogram for malignant neoplasm of breast: Secondary | ICD-10-CM | POA: Diagnosis not present

## 2017-04-25 ENCOUNTER — Telehealth: Payer: Self-pay | Admitting: Adult Health

## 2017-04-25 ENCOUNTER — Encounter: Payer: Self-pay | Admitting: Adult Health

## 2017-04-25 ENCOUNTER — Ambulatory Visit (INDEPENDENT_AMBULATORY_CARE_PROVIDER_SITE_OTHER): Payer: Medicare Other | Admitting: Adult Health

## 2017-04-25 VITALS — BP 128/60 | Temp 97.7°F | Wt 161.0 lb

## 2017-04-25 DIAGNOSIS — G8929 Other chronic pain: Secondary | ICD-10-CM

## 2017-04-25 DIAGNOSIS — M5441 Lumbago with sciatica, right side: Secondary | ICD-10-CM | POA: Diagnosis not present

## 2017-04-25 DIAGNOSIS — M791 Myalgia, unspecified site: Secondary | ICD-10-CM

## 2017-04-25 DIAGNOSIS — R5383 Other fatigue: Secondary | ICD-10-CM

## 2017-04-25 DIAGNOSIS — E875 Hyperkalemia: Secondary | ICD-10-CM

## 2017-04-25 DIAGNOSIS — M5442 Lumbago with sciatica, left side: Secondary | ICD-10-CM

## 2017-04-25 LAB — CBC WITH DIFFERENTIAL/PLATELET
BASOS PCT: 0.6 % (ref 0.0–3.0)
Basophils Absolute: 0 10*3/uL (ref 0.0–0.1)
EOS ABS: 0.2 10*3/uL (ref 0.0–0.7)
Eosinophils Relative: 2.9 % (ref 0.0–5.0)
HEMATOCRIT: 35 % — AB (ref 36.0–46.0)
Hemoglobin: 11.6 g/dL — ABNORMAL LOW (ref 12.0–15.0)
LYMPHS ABS: 1.3 10*3/uL (ref 0.7–4.0)
LYMPHS PCT: 18.9 % (ref 12.0–46.0)
MCHC: 33.2 g/dL (ref 30.0–36.0)
MCV: 92.3 fl (ref 78.0–100.0)
Monocytes Absolute: 0.6 10*3/uL (ref 0.1–1.0)
Monocytes Relative: 8.8 % (ref 3.0–12.0)
NEUTROS ABS: 4.8 10*3/uL (ref 1.4–7.7)
Neutrophils Relative %: 68.8 % (ref 43.0–77.0)
PLATELETS: 244 10*3/uL (ref 150.0–400.0)
RBC: 3.79 Mil/uL — ABNORMAL LOW (ref 3.87–5.11)
RDW: 13.7 % (ref 11.5–15.5)
WBC: 6.9 10*3/uL (ref 4.0–10.5)

## 2017-04-25 LAB — BASIC METABOLIC PANEL
BUN: 40 mg/dL — ABNORMAL HIGH (ref 6–23)
CHLORIDE: 104 meq/L (ref 96–112)
CO2: 29 meq/L (ref 19–32)
CREATININE: 1.39 mg/dL — AB (ref 0.40–1.20)
Calcium: 9.6 mg/dL (ref 8.4–10.5)
GFR: 37.7 mL/min — ABNORMAL LOW (ref >60.00)
GLUCOSE: 119 mg/dL — AB (ref 70–99)
Potassium: 5.9 mEq/L — ABNORMAL HIGH (ref 3.5–5.1)
Sodium: 140 mEq/L (ref 135–145)

## 2017-04-25 LAB — VITAMIN B12: VITAMIN B 12: 766 pg/mL (ref 211–911)

## 2017-04-25 LAB — TSH: TSH: 1.7 u[IU]/mL (ref 0.35–4.50)

## 2017-04-25 NOTE — Patient Instructions (Signed)
It was good to see you today.    Continue your tylenol extra strength three times a day.  Use your heat pad and biofreeze as needed.

## 2017-04-25 NOTE — Progress Notes (Signed)
Subjective:    Patient ID: Sara Garza, female    DOB: [birth year], 84 y.o.   MRN: [id number]  HPI 84 year old female who  has a past medical history of Arthritis, CAD (coronary artery disease), Carotid stenosis, left, Diabetes mellitus, Glaucoma, Hyperlipidemia, Hypertension, Leg pain, and Osteoporosis. She presents to the clinic today for the complaint of bilateral hip pain for one year, which has been becoming progressively worse. This pain is described as "aching", the pain is moderate and consistent. She feels as though the pain causes her to " have my legs give out of me." She has experienced multiple falls due to this, none resulting in injury. She feels as though her symptoms are aggravated by movement and weight bearing. She did have a lumbar epidural injection on 12/31/2016 by Dr. Ernestina Patches and then one on 02/12/2017 with Dr. Ileene Rubens. She reports that she has not had any relief from these injections.   Also reports pain in her bilateral groin and down the front and sides of her legs that has been present for about a year as well. This pain is described as "aching" and "tingling" and is constant. She feels as though her pain is more muscular related and also feels as though it is starting to interfere with daily life. Movement and change of position aggravate symptoms. Denies any cramping sensation.  She denies any issues with bowel or bladder.   Her daughter who is with her at this visit also reports that she feels as though her mother has been more fatigued as of lately. The patient does not deny this but does not feel as though this is anything that is out of the ordinary. She denies any fevers, nausea, vomiting, or diarrhea.    Review of Systems  Constitutional: Negative.   Respiratory: Negative.   Cardiovascular: Negative.   Gastrointestinal: Negative.   Genitourinary: Negative for difficulty urinating, flank pain, hematuria and urgency.  Musculoskeletal: Positive for arthralgias,  back pain, gait problem and myalgias. Negative for joint swelling.  Skin: Negative.   Neurological: Negative for dizziness, syncope and weakness.    Past Medical History:  Diagnosis Date  . Arthritis   . CAD (coronary artery disease)   . Carotid stenosis, left    50-60% stable  . Diabetes mellitus   . Glaucoma   . Hyperlipidemia   . Hypertension   . Leg pain    ABIs 2/18: normal bilaterally.  . Osteoporosis     Social History   Socioeconomic History  . Marital status: Widowed    Spouse name: Not on file  . Number of children: Not on file  . Years of education: Not on file  . Highest education level: Not on file  Social Needs  . Financial resource strain: Not on file  . Food insecurity - worry: Not on file  . Food insecurity - inability: Not on file  . Transportation needs - medical: Not on file  . Transportation needs - non-medical: Not on file  Occupational History  . Not on file  Tobacco Use  . Smoking status: Passive Smoke Exposure - Never Smoker  . Smokeless tobacco: Never Used  Substance and Sexual Activity  . Alcohol use: Yes    Alcohol/week: 0.0 oz    Comment: very seldom  . Drug use: No  . Sexual activity: Not Currently  Other Topics Concern  . Not on file  Social History Narrative   She worked as a Insurance account manager for  10 years    Has one daughter      She likes to read and she takes classes at [employer]    Past Surgical History:  Procedure Laterality Date  . ANOMALOUS PULMONARY VENOUS RETURN REPAIR, TOTAL    . APPENDECTOMY    . CARDIAC VALVE REPLACEMENT  04/29/2012  . Cataracts Bilateral   . CESAREAN SECTION    . FOOT SURGERY     [name]  . HIP ARTHROPLASTY Right 07/31/2013   Procedure: ARTHROPLASTY BIPOLAR HIP;  Surgeon: [name], MD;  Location: [facility];  Service: Orthopedics;  Laterality: Right;  . PERCUTANEOUS CORONARY STENT INTERVENTION (PCI-S) N/A 01/02/2012   Procedure: PERCUTANEOUS CORONARY STENT INTERVENTION (PCI-S);   Surgeon: Sherren Mocha, MD;  [facility] CATH LAB;  Service: Cardiovascular;  Laterality: N/A;    Family History  Problem Relation Age of Onset  . COPD Father   . Cancer Father        lung cancer  . Lung cancer Unknown     Allergies  Allergen Reactions  . Statins Other (See Comments)    Muscle aches  . Gabapentin Other (See Comments)    SOMNOLENCE    Current Outpatient Medications on File Prior to Visit  Medication Sig Dispense Refill  . amLODipine (NORVASC) 5 MG tablet TAKE 1 TABLET(5 MG) BY MOUTH DAILY 90 tablet 2  . aspirin EC 81 MG tablet Take 1 tablet (81 mg total) by mouth daily. Start after lovenox injections are done after 2 weeks    . bimatoprost (LUMIGAN) 0.01 % SOLN Place 1 drop into both eyes at bedtime.     . Calcium Carb-Cholecalciferol (CALCIUM 500 +D) 500-400 MG-UNIT TABS Take 1 tablet by mouth daily.     . carvedilol (COREG) 3.125 MG tablet Take 3.125 mg by mouth 2 (two) times daily with a meal.    . ezetimibe (ZETIA) 10 MG tablet TAKE 1 TABLET(10 MG) BY MOUTH DAILY 30 tablet 5  . furosemide (LASIX) 20 MG tablet Take 0.5 tablets (10 mg total) by mouth daily. 45 tablet 3  . isosorbide mononitrate (IMDUR) 60 MG 24 hr tablet TAKE 1 TABLET(60 MG) BY MOUTH DAILY 30 tablet 5  . mirabegron ER (MYRBETRIQ) 25 MG TB24 tablet Take 1 tablet (25 mg total) by mouth daily. 90 tablet 2  . Multiple Vitamin (MULTIVITAMIN) tablet Take 1 tablet by mouth daily.      [name] amoxicillin (AMOXIL) 500 MG capsule Take 2,000 mg by mouth as directed. 1 hour prior to dental procedures  1   No current facility-administered medications on file prior to visit.     BP 128/60   Temp 97.7 F (36.5 C) (Oral)   Wt 161 lb (73 kg)   BMI 31.44 kg/m         Objective:   Physical Exam  Constitutional: She is oriented to person, place, and time. She appears well-developed and well-nourished.  Cardiovascular: Normal rate, regular rhythm, normal heart sounds and intact distal pulses. Exam reveals  no gallop and no friction rub.  No murmur heard. Pulmonary/Chest: Effort normal and breath sounds normal. No respiratory distress. She has no wheezes. She has no rales. She exhibits no tenderness.  Abdominal: Soft. Bowel sounds are normal. She exhibits no distension and no mass. There is no tenderness. There is no rebound and no guarding.  Musculoskeletal: Normal range of motion. She exhibits tenderness. She exhibits no edema or deformity.       Right hip: She exhibits tenderness.  Left hip: She exhibits tenderness.       Lumbar back: She exhibits tenderness.  Tenderness over the bilateral groins and front of the legs.   Neurological: She is alert and oriented to person, place, and time.  Skin: Skin is warm and dry.  Psychiatric: She has a normal mood and affect. Her behavior is normal. Judgment and thought content normal.  Nursing note and vitals reviewed.     Assessment & Plan:  1. Chronic midline low back pain with bilateral sciatica - Possibly due to arthritic pain. Prednisone shots did not help. Will send to PT and ortho for further treatment.  - Basic Metabolic Panel - CBC with Differential/Platelet - Ambulatory referral to Physical Therapy - AMB referral to orthopedics - Vitamin B12 - TSH  2. Fatigue, unspecified type - Vitamin B12 - TSH  3. Muscle ache  - Unknown cause at this time. Will check basic labs for possible electrolyte abnormality and dehydration.  - Basic Metabolic Panel - CBC with Differential/Platelet - Ambulatory referral to Physical Therapy - AMB referral to orthopedics  [name], NP

## 2017-04-25 NOTE — Telephone Encounter (Signed)
Spoke to patient and informed her of her labs, potassium elevated at 5.9.  She denies taking any over-the-counter potassium supplements, does take a multivitamin.  She is prescribed Lasix but has only been taking this 1 time a week.  To have her stop the multivitamin and the off chance that is partially causing her elevated potassium and have also asked that she increase Lasix to 2 or 3 times per week.  She will follow-up next week for repeat BMP

## 2017-04-28 ENCOUNTER — Telehealth: Payer: Self-pay | Admitting: Cardiovascular Disease

## 2017-04-28 ENCOUNTER — Telehealth: Payer: Self-pay | Admitting: Adult Health

## 2017-04-28 NOTE — Telephone Encounter (Signed)
Pt c/o medication issue:  1. Name of Medication:lasix   2. How are you currently taking this medication (dosage and times per day)? Not sure how she is supposed to take the medication  3. Are you having a reaction (difficulty breathing--STAT)? No   4. What is your medication issue?Not sure how she is to take the medication , Also she is having problems with her potassium being high .

## 2017-04-28 NOTE — Telephone Encounter (Signed)
Pt called wants to know the Lasix dose he suppose to take. Pt is aware that she is to take 10 mg of lasix every day. Pt verbalized understanding. Pt states that her potassium level is 5.9 order by PCP. She states that the PCP is taken care of that issue.

## 2017-04-28 NOTE — Telephone Encounter (Signed)
Daughter called to verify information given to pt. Via telephone encounter from Mr.Nafziger.

## 2017-04-29 ENCOUNTER — Telehealth: Payer: Self-pay | Admitting: Family Medicine

## 2017-04-29 ENCOUNTER — Other Ambulatory Visit (INDEPENDENT_AMBULATORY_CARE_PROVIDER_SITE_OTHER): Payer: Medicare Other

## 2017-04-29 DIAGNOSIS — R5383 Other fatigue: Secondary | ICD-10-CM

## 2017-04-29 NOTE — Telephone Encounter (Signed)
Order for test sent.

## 2017-04-29 NOTE — Telephone Encounter (Signed)
Ok to add on vit. D

## 2017-04-29 NOTE — Telephone Encounter (Signed)
Copied from [place] [id number]. Topic: Quick Communication - See Telephone Encounter >> Apr 25, 2017  3:41 PM Antonieta Iba C wrote: CRM for notification. See Telephone encounter for: pt's daughter called in to request to have order for vitamin D added on to pt's labs. She said that mom has been feeling fatigue.   04/25/17.

## 2017-04-30 LAB — VITAMIN D 25 HYDROXY (VIT D DEFICIENCY, FRACTURES): VITD: 37.79 ng/mL (ref 30.00–100.00)

## 2017-05-02 ENCOUNTER — Ambulatory Visit: Payer: Medicare Other | Admitting: Adult Health

## 2017-05-02 ENCOUNTER — Other Ambulatory Visit (INDEPENDENT_AMBULATORY_CARE_PROVIDER_SITE_OTHER): Payer: Medicare Other

## 2017-05-02 DIAGNOSIS — E875 Hyperkalemia: Secondary | ICD-10-CM

## 2017-05-02 LAB — BASIC METABOLIC PANEL
BUN: 41 mg/dL — ABNORMAL HIGH (ref 6–23)
CALCIUM: 8.8 mg/dL (ref 8.4–10.5)
CO2: 28 mEq/L (ref 19–32)
Chloride: 104 mEq/L (ref 96–112)
Creatinine, Ser: 1.47 mg/dL — ABNORMAL HIGH (ref 0.40–1.20)
GFR: 35.34 mL/min — ABNORMAL LOW (ref >60.00)
Glucose, Bld: 118 mg/dL — ABNORMAL HIGH (ref 70–99)
POTASSIUM: 3.9 meq/L (ref 3.5–5.1)
SODIUM: 142 meq/L (ref 135–145)

## 2017-05-05 ENCOUNTER — Encounter: Payer: Self-pay | Admitting: Family Medicine

## 2017-05-05 DIAGNOSIS — M5431 Sciatica, right side: Secondary | ICD-10-CM | POA: Diagnosis not present

## 2017-05-05 DIAGNOSIS — M545 Low back pain: Secondary | ICD-10-CM | POA: Diagnosis not present

## 2017-05-05 DIAGNOSIS — M5416 Radiculopathy, lumbar region: Secondary | ICD-10-CM | POA: Diagnosis not present

## 2017-05-05 DIAGNOSIS — M62830 Muscle spasm of back: Secondary | ICD-10-CM | POA: Diagnosis not present

## 2017-05-07 DIAGNOSIS — M5431 Sciatica, right side: Secondary | ICD-10-CM | POA: Diagnosis not present

## 2017-05-07 DIAGNOSIS — M62830 Muscle spasm of back: Secondary | ICD-10-CM | POA: Diagnosis not present

## 2017-05-07 DIAGNOSIS — M545 Low back pain: Secondary | ICD-10-CM | POA: Diagnosis not present

## 2017-05-07 DIAGNOSIS — M5416 Radiculopathy, lumbar region: Secondary | ICD-10-CM | POA: Diagnosis not present

## 2017-05-09 ENCOUNTER — Telehealth: Payer: Self-pay | Admitting: Adult Health

## 2017-05-09 DIAGNOSIS — M5416 Radiculopathy, lumbar region: Secondary | ICD-10-CM | POA: Diagnosis not present

## 2017-05-09 DIAGNOSIS — M5431 Sciatica, right side: Secondary | ICD-10-CM | POA: Diagnosis not present

## 2017-05-09 DIAGNOSIS — M545 Low back pain: Secondary | ICD-10-CM | POA: Diagnosis not present

## 2017-05-09 DIAGNOSIS — M62830 Muscle spasm of back: Secondary | ICD-10-CM | POA: Diagnosis not present

## 2017-05-09 NOTE — Telephone Encounter (Signed)
Called in with questions regarding the other readings from the BMP that was drawn on 05/02/17.  She received her letter with the results that her potassium was normal but has questions regarding the other abnormal readings:  Glucose, BUN, etc.  I have routed a note to Christa See nurse pool making him aware of her questions.

## 2017-05-13 ENCOUNTER — Telehealth: Payer: Self-pay | Admitting: Adult Health

## 2017-05-13 NOTE — Telephone Encounter (Signed)
Left VM to call back with questions regarding labs

## 2017-05-14 ENCOUNTER — Telehealth: Payer: Self-pay | Admitting: Adult Health

## 2017-05-14 NOTE — Telephone Encounter (Signed)
Pt called and updated on results being normal. Pt wants paper copy sent again.

## 2017-05-15 NOTE — Telephone Encounter (Signed)
Sent again by mail per pt request.

## 2017-05-27 DIAGNOSIS — H43813 Vitreous degeneration, bilateral: Secondary | ICD-10-CM | POA: Diagnosis not present

## 2017-05-27 DIAGNOSIS — H353131 Nonexudative age-related macular degeneration, bilateral, early dry stage: Secondary | ICD-10-CM | POA: Diagnosis not present

## 2017-05-27 DIAGNOSIS — E119 Type 2 diabetes mellitus without complications: Secondary | ICD-10-CM | POA: Diagnosis not present

## 2017-06-13 DIAGNOSIS — M4316 Spondylolisthesis, lumbar region: Secondary | ICD-10-CM | POA: Diagnosis not present

## 2017-06-13 DIAGNOSIS — M5416 Radiculopathy, lumbar region: Secondary | ICD-10-CM | POA: Diagnosis not present

## 2017-06-13 DIAGNOSIS — M5136 Other intervertebral disc degeneration, lumbar region: Secondary | ICD-10-CM | POA: Diagnosis not present

## 2017-06-19 ENCOUNTER — Telehealth (INDEPENDENT_AMBULATORY_CARE_PROVIDER_SITE_OTHER): Payer: Self-pay | Admitting: Physical Medicine and Rehabilitation

## 2017-06-19 NOTE — Telephone Encounter (Signed)
12/31/2016 OV NOTE FAXED TO DR. Nelva Bush [phone]

## 2017-06-21 DIAGNOSIS — M5416 Radiculopathy, lumbar region: Secondary | ICD-10-CM | POA: Diagnosis not present

## 2017-06-21 DIAGNOSIS — M545 Low back pain: Secondary | ICD-10-CM | POA: Diagnosis not present

## 2017-06-24 ENCOUNTER — Other Ambulatory Visit: Payer: Self-pay | Admitting: Physical Medicine and Rehabilitation

## 2017-06-24 DIAGNOSIS — N289 Disorder of kidney and ureter, unspecified: Secondary | ICD-10-CM

## 2017-06-30 ENCOUNTER — Ambulatory Visit
Admission: RE | Admit: 2017-06-30 | Discharge: 2017-06-30 | Disposition: A | Discharge status: Home / Self Care | Payer: Medicare Other | Source: Ambulatory Visit | Attending: Physical Medicine and Rehabilitation | Admitting: Physical Medicine and Rehabilitation

## 2017-06-30 DIAGNOSIS — N281 Cyst of kidney, acquired: Secondary | ICD-10-CM | POA: Diagnosis not present

## 2017-06-30 DIAGNOSIS — N289 Disorder of kidney and ureter, unspecified: Secondary | ICD-10-CM

## 2017-07-04 DIAGNOSIS — M48061 Spinal stenosis, lumbar region without neurogenic claudication: Secondary | ICD-10-CM | POA: Diagnosis not present

## 2017-07-04 DIAGNOSIS — M5136 Other intervertebral disc degeneration, lumbar region: Secondary | ICD-10-CM | POA: Diagnosis not present

## 2017-07-04 DIAGNOSIS — M5416 Radiculopathy, lumbar region: Secondary | ICD-10-CM | POA: Diagnosis not present

## 2017-07-11 ENCOUNTER — Other Ambulatory Visit: Payer: Self-pay | Admitting: Adult Health

## 2017-07-12 ENCOUNTER — Other Ambulatory Visit: Payer: Self-pay | Admitting: Cardiovascular Disease

## 2017-07-12 DIAGNOSIS — I359 Nonrheumatic aortic valve disorder, unspecified: Secondary | ICD-10-CM

## 2017-07-12 DIAGNOSIS — I10 Essential (primary) hypertension: Secondary | ICD-10-CM

## 2017-07-12 DIAGNOSIS — I25119 Atherosclerotic heart disease of native coronary artery with unspecified angina pectoris: Secondary | ICD-10-CM

## 2017-07-13 ENCOUNTER — Other Ambulatory Visit: Payer: Self-pay | Admitting: Cardiovascular Disease

## 2017-07-13 DIAGNOSIS — I10 Essential (primary) hypertension: Secondary | ICD-10-CM

## 2017-07-13 DIAGNOSIS — I25119 Atherosclerotic heart disease of native coronary artery with unspecified angina pectoris: Secondary | ICD-10-CM

## 2017-07-13 DIAGNOSIS — I359 Nonrheumatic aortic valve disorder, unspecified: Secondary | ICD-10-CM

## 2017-07-22 ENCOUNTER — Telehealth: Payer: Self-pay | Admitting: Adult Health

## 2017-07-22 NOTE — Telephone Encounter (Signed)
Copied from [place] [id number]. Topic: Quick Communication - See Telephone Encounter >> Jul 22, 2017 12:26 PM Vernona Rieger wrote: CRM for notification. See Telephone encounter for: 07/22/17.  Patient said she thinks she needs to come in and have blood work done but does not know what for. She has no active request in for blood work. Please advise [phone]

## 2017-07-22 NOTE — Telephone Encounter (Signed)
Please advise? I do not see anything to indicate that she is due for blood work.

## 2017-07-23 NOTE — Telephone Encounter (Signed)
Spoke to the pt and she is now scheduled for annual visit on 08/27/17 @ 10:30 AM.

## 2017-07-23 NOTE — Telephone Encounter (Signed)
She is due for her physical and that includes blood work

## 2017-08-01 DIAGNOSIS — M5137 Other intervertebral disc degeneration, lumbosacral region: Secondary | ICD-10-CM | POA: Diagnosis not present

## 2017-08-11 DIAGNOSIS — L570 Actinic keratosis: Secondary | ICD-10-CM | POA: Diagnosis not present

## 2017-08-22 DIAGNOSIS — M5136 Other intervertebral disc degeneration, lumbar region: Secondary | ICD-10-CM | POA: Diagnosis not present

## 2017-08-22 DIAGNOSIS — M5416 Radiculopathy, lumbar region: Secondary | ICD-10-CM | POA: Diagnosis not present

## 2017-08-22 DIAGNOSIS — N289 Disorder of kidney and ureter, unspecified: Secondary | ICD-10-CM | POA: Diagnosis not present

## 2017-08-22 DIAGNOSIS — M48061 Spinal stenosis, lumbar region without neurogenic claudication: Secondary | ICD-10-CM | POA: Diagnosis not present

## 2017-08-22 DIAGNOSIS — M4316 Spondylolisthesis, lumbar region: Secondary | ICD-10-CM | POA: Diagnosis not present

## 2017-08-27 ENCOUNTER — Encounter: Payer: Self-pay | Admitting: Adult Health

## 2017-08-27 ENCOUNTER — Ambulatory Visit (INDEPENDENT_AMBULATORY_CARE_PROVIDER_SITE_OTHER): Payer: Medicare Other | Admitting: Adult Health

## 2017-08-27 DIAGNOSIS — I2510 Atherosclerotic heart disease of native coronary artery without angina pectoris: Secondary | ICD-10-CM

## 2017-08-27 DIAGNOSIS — I25119 Atherosclerotic heart disease of native coronary artery with unspecified angina pectoris: Secondary | ICD-10-CM

## 2017-08-27 DIAGNOSIS — E118 Type 2 diabetes mellitus with unspecified complications: Secondary | ICD-10-CM

## 2017-08-27 DIAGNOSIS — I10 Essential (primary) hypertension: Secondary | ICD-10-CM | POA: Diagnosis not present

## 2017-08-27 DIAGNOSIS — E785 Hyperlipidemia, unspecified: Secondary | ICD-10-CM

## 2017-08-27 LAB — BASIC METABOLIC PANEL
BUN: 39 mg/dL — ABNORMAL HIGH (ref 6–23)
CHLORIDE: 104 meq/L (ref 96–112)
CO2: 29 meq/L (ref 19–32)
Calcium: 9.1 mg/dL (ref 8.4–10.5)
Creatinine, Ser: 1.44 mg/dL — ABNORMAL HIGH (ref 0.40–1.20)
GFR: 36.17 mL/min — ABNORMAL LOW (ref >60.00)
Glucose, Bld: 88 mg/dL (ref 70–99)
POTASSIUM: 5.2 meq/L — AB (ref 3.5–5.1)
SODIUM: 140 meq/L (ref 135–145)

## 2017-08-27 LAB — HEPATIC FUNCTION PANEL
ALBUMIN: 3.8 g/dL (ref 3.5–5.2)
ALK PHOS: 61 U/L (ref 39–117)
ALT: 11 U/L (ref 0–35)
AST: 13 U/L (ref 0–37)
Bilirubin, Direct: 0.1 mg/dL (ref 0.0–0.3)
Total Bilirubin: 0.5 mg/dL (ref 0.2–1.2)
Total Protein: 7.1 g/dL (ref 6.0–8.3)

## 2017-08-27 LAB — CBC WITH DIFFERENTIAL/PLATELET
BASOS ABS: 0 10*3/uL (ref 0.0–0.1)
BASOS PCT: 0.7 % (ref 0.0–3.0)
EOS PCT: 3.7 % (ref 0.0–5.0)
Eosinophils Absolute: 0.2 10*3/uL (ref 0.0–0.7)
HEMATOCRIT: 34.4 % — AB (ref 36.0–46.0)
Hemoglobin: 11.5 g/dL — ABNORMAL LOW (ref 12.0–15.0)
Lymphocytes Relative: 23.1 % (ref 12.0–46.0)
Lymphs Abs: 1.4 10*3/uL (ref 0.7–4.0)
MCHC: 33.4 g/dL (ref 30.0–36.0)
MCV: 91.6 fl (ref 78.0–100.0)
MONOS PCT: 11.4 % (ref 3.0–12.0)
Monocytes Absolute: 0.7 10*3/uL (ref 0.1–1.0)
NEUTROS ABS: 3.7 10*3/uL (ref 1.4–7.7)
Neutrophils Relative %: 61.1 % (ref 43.0–77.0)
PLATELETS: 225 10*3/uL (ref 150.0–400.0)
RBC: 3.75 Mil/uL — ABNORMAL LOW (ref 3.87–5.11)
RDW: 13.4 % (ref 11.5–15.5)
WBC: 6.1 10*3/uL (ref 4.0–10.5)

## 2017-08-27 LAB — LIPID PANEL
CHOL/HDL RATIO: 5
Cholesterol: 216 mg/dL — ABNORMAL HIGH (ref 0–200)
HDL: 45.8 mg/dL (ref >39.00)
LDL Cholesterol: 134 mg/dL — ABNORMAL HIGH (ref 0–99)
NONHDL: 170.38
Triglycerides: 182 mg/dL — ABNORMAL HIGH (ref 0.0–149.0)
VLDL: 36.4 mg/dL (ref 0.0–40.0)

## 2017-08-27 LAB — HEMOGLOBIN A1C: Hgb A1c MFr Bld: 6.2 % (ref 4.6–6.5)

## 2017-08-27 NOTE — Progress Notes (Signed)
Subjective:    Patient ID: Sara Garza, female    DOB: Mar 14, 1924, 84 y.o.   MRN: [id number]  HPI  Patient presents for yearly follow up exam. She is a pleasant 84 year old female who  has a past medical history of Arthritis, CAD (coronary artery disease), Carotid stenosis, left, Diabetes mellitus, Glaucoma, Hyperlipidemia, Hypertension, Leg pain, and Osteoporosis.  Essential Hypertension - Takes Norvasc 5 mg and carvedilol 3.125 mg twice daily BP Readings from Last 3 Encounters:  08/27/17 (!) 126/52  04/25/17 128/60  02/26/17 (!) 132/48    Aortic Valve Disease -status post [name] procedure in 2014. She has echo scheduled for tomorrow  CAD with angina -is followed by cardiology.  Current therapy includes amlodipine, carvedilol, and isosorbide for antianginal therapy.  Overactive bladder/incontinence.  Currently prior prescribed Myrbetriq 25 mg.  She feels well controlled on this  Hyperlipidemia -intolerant to statins.  Currently prescribed Zetia 10 mg  DM - Well controlled with diet in the past.   Lab Results  Component Value Date   HGBA1C 6.6 (H) 06/18/2016    All immunizations and health maintenance protocols were reviewed with the patient and needed orders were placed. She is UTD on vaccinations   Appropriate screening laboratory values were ordered for the patient including screening of hyperlipidemia, renal function and hepatic function.  Medication reconciliation,  past medical history, social history, problem list and allergies were reviewed in detail with the patient  Goals were established with regard to weight loss, exercise, and  diet in compliance with medications  End of life planning was discussed. She has an advanced directive and living will.   Review of Systems  Constitutional: Negative.   Respiratory: Negative.   Cardiovascular: Negative.   Gastrointestinal: Negative.   Genitourinary: Negative.   Musculoskeletal: Positive for arthralgias, back pain  and gait problem.  Skin: Negative.   Allergic/Immunologic: Negative.   Psychiatric/Behavioral: Negative.   All other systems reviewed and are negative.  Past Medical History:  Diagnosis Date  . Arthritis   . CAD (coronary artery disease)   . Carotid stenosis, left    50-60% stable  . Diabetes mellitus   . Glaucoma   . Hyperlipidemia   . Hypertension   . Leg pain    ABIs 2/18: normal bilaterally.  . Osteoporosis     Social History   Socioeconomic History  . Marital status: Widowed    Spouse name: Not on file  . Number of children: Not on file  . Years of education: Not on file  . Highest education level: Not on file  Occupational History  . Not on file  Social Needs  . Financial resource strain: Not on file  . Food insecurity:    Worry: Not on file    Inability: Not on file  . Transportation needs:    Medical: Not on file    Non-medical: Not on file  Tobacco Use  . Smoking status: Passive Smoke Exposure - Never Smoker  . Smokeless tobacco: Never Used  Substance and Sexual Activity  . Alcohol use: Yes    Alcohol/week: 0.0 oz    Comment: very seldom  . Drug use: No  . Sexual activity: Not Currently  Lifestyle  . Physical activity:    Days per week: Not on file    Minutes per session: Not on file  . Stress: Not on file  Relationships  . Social connections:    Talks on phone: Not on file    Gets together:  Not on file    Attends religious service: Not on file    Active member of club or organization: Not on file    Attends meetings of clubs or organizations: Not on file    Relationship status: Not on file  . Intimate partner violence:    Fear of current or ex partner: Not on file    Emotionally abused: Not on file    Physically abused: Not on file    Forced sexual activity: Not on file  Other Topics Concern  . Not on file  Social History Narrative   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      She likes to read and she  takes classes at [employer]    Past Surgical History:  Procedure Laterality Date  . ANOMALOUS PULMONARY VENOUS RETURN REPAIR, TOTAL    . APPENDECTOMY    . CARDIAC VALVE REPLACEMENT  04/29/2012  . Cataracts Bilateral   . CESAREAN SECTION    . FOOT SURGERY     [name]  . HIP ARTHROPLASTY Right 07/31/2013   Procedure: ARTHROPLASTY BIPOLAR HIP;  Surgeon: [name], MD;  Location: [facility];  Service: Orthopedics;  Laterality: Right;  . PERCUTANEOUS CORONARY STENT INTERVENTION (PCI-S) N/A 01/02/2012   Procedure: PERCUTANEOUS CORONARY STENT INTERVENTION (PCI-S);  Surgeon: Sherren Mocha, MD;  Location[facility] C[facility] LAB;  Service: Cardiovascular;  Laterality: N/A;    Family History  Problem Relation Age of Onset  . COPD Father   . Cancer Father        lung cancer  . Lung cancer Unknown     Allergies  Allergen Reactions  . Statins Other (See Comments)    Muscle aches  . Gabapentin Other (See Comments)    SOMNOLENCE    Current Outpatient Medications on File Prior to Visit  Medication Sig Dispense Refill  . amLODipine (NORVASC) 5 MG tablet TAKE 1 TABLET(5 MG) BY MOUTH DAILY 90 tablet 0  . amoxicillin (AMOXIL) 500 MG capsule Take 2,000 mg by mouth as directed. 1 hour prior to dental procedures  1  . aspirin EC 81 MG tablet Take 1 tablet (81 mg total) by mouth daily. Start after lovenox injections are done after 2 weeks    . bimatoprost (LUMIGAN) 0.01 % SOLN Place 1 drop into both eyes at bedtime.     . Calcium Carb-Cholecalciferol (CALCIUM 500 +D) 500-400 MG-UNIT TABS Take 1 tablet by mouth daily.     . carvedilol (COREG) 3.125 MG tablet Take 3.125 mg by mouth 2 (two) times daily with a meal.    . ezetimibe (ZETIA) 10 MG tablet TAKE 1 TABLET(10 MG) BY MOUTH DAILY 30 tablet 7  . furosemide (LASIX) 20 MG tablet Take 0.5 tablets (10 mg total) by mouth daily. 45 tablet 3  . isosorbide mononitrate (IMDUR) 60 MG 24 hr tablet TAKE 1 TABLET(60 MG) BY MOUTH DAILY 30 tablet 6  . mirabegron  ER (MYRBETRIQ) 25 MG TB24 tablet Take 1 tablet (25 mg total) by mouth daily. 90 tablet 2  . Multiple Vitamin (MULTIVITAMIN) tablet Take 1 tablet by mouth daily.       No current facility-administered medications on file prior to visit.     BP (!) 126/52   Temp 98.1 F (36.7 C) (Oral)   Ht 4\' 9"  (1.448 m) Comment: WITHOUT SHOES  Wt 162 lb (73.5 kg)   BMI 35.06 kg/m       Objective:  Physical Exam  Constitutional: She is oriented to person, place, and time. She appears well-developed and well-nourished. No distress.  HENT:  Head: Normocephalic and atraumatic.  Right Ear: External ear normal.  Left Ear: External ear normal.  Nose: Nose normal.  Mouth/Throat: Oropharynx is clear and moist. No oropharyngeal exudate.  Eyes: Pupils are equal, round, and reactive to light. Conjunctivae and EOM are normal. Right eye exhibits no discharge. Left eye exhibits no discharge.  Neck: Normal range of motion. Neck supple. No JVD present. No tracheal deviation present. No thyromegaly present.  Cardiovascular: Normal rate, regular rhythm and intact distal pulses. Exam reveals no gallop and no friction rub.  Murmur heard.  Systolic murmur is present with a grade of 3/6. Harsh murmur heard best at right upper sternal border  Pulmonary/Chest: Effort normal and breath sounds normal. No stridor. No respiratory distress. She has no wheezes. She has no rales. She exhibits no tenderness.  Abdominal: Soft. Bowel sounds are normal. She exhibits no distension and no mass. There is no tenderness. There is no rebound and no guarding. No hernia.  Musculoskeletal: Normal range of motion. She exhibits no edema, tenderness or deformity.  Lymphadenopathy:    She has no cervical adenopathy.  Neurological: She is alert and oriented to person, place, and time. She displays normal reflexes. No cranial nerve deficit or sensory deficit. She exhibits normal muscle tone. Coordination normal.  Skin: Skin is warm and dry.  Capillary refill takes less than 2 seconds. No rash noted. She is not diaphoretic. No erythema. No pallor.  Psychiatric: She has a normal mood and affect. Her behavior is normal. Judgment and thought content normal.  Nursing note and vitals reviewed.     Assessment & Plan:  1. Coronary artery disease involving native coronary artery of native heart without angina pectoris - Has cardiology consult early next week  - Basic metabolic panel - CBC with Differential/Platelet - Lipid panel - Hepatic function panel - Hemoglobin A1c  2. Hyperlipidemia, unspecified hyperlipidemia type - Continue Zetia  - Basic metabolic panel - CBC with Differential/Platelet - Lipid panel - Hepatic function panel - Hemoglobin A1c  3. Essential hypertension - Well controlled. No change in medication  - Basic metabolic panel - CBC with Differential/Platelet - Lipid panel - Hepatic function panel - Hemoglobin A1c  4. Controlled type 2 diabetes mellitus with complication, without long-term current use of insulin ([place]) - Consider adding agent; unlikely due to age  - Basic metabolic panel - CBC with Differential/Platelet - Lipid panel - Hepatic function panel - Hemoglobin A1c  [name], NP

## 2017-08-28 ENCOUNTER — Encounter: Payer: Self-pay | Admitting: Family Medicine

## 2017-08-28 ENCOUNTER — Telehealth: Payer: Self-pay | Admitting: Family Medicine

## 2017-08-28 ENCOUNTER — Ambulatory Visit (HOSPITAL_COMMUNITY): Payer: Medicare Other | Attending: Cardiovascular Disease

## 2017-08-28 ENCOUNTER — Other Ambulatory Visit: Payer: Self-pay

## 2017-08-28 DIAGNOSIS — I371 Nonrheumatic pulmonary valve insufficiency: Secondary | ICD-10-CM | POA: Diagnosis not present

## 2017-08-28 DIAGNOSIS — I110 Hypertensive heart disease with heart failure: Secondary | ICD-10-CM | POA: Insufficient documentation

## 2017-08-28 DIAGNOSIS — I253 Aneurysm of heart: Secondary | ICD-10-CM | POA: Insufficient documentation

## 2017-08-28 DIAGNOSIS — I447 Left bundle-branch block, unspecified: Secondary | ICD-10-CM | POA: Insufficient documentation

## 2017-08-28 DIAGNOSIS — E785 Hyperlipidemia, unspecified: Secondary | ICD-10-CM | POA: Diagnosis not present

## 2017-08-28 DIAGNOSIS — I509 Heart failure, unspecified: Secondary | ICD-10-CM | POA: Diagnosis not present

## 2017-08-28 DIAGNOSIS — I359 Nonrheumatic aortic valve disorder, unspecified: Secondary | ICD-10-CM

## 2017-08-28 DIAGNOSIS — I2510 Atherosclerotic heart disease of native coronary artery without angina pectoris: Secondary | ICD-10-CM | POA: Diagnosis not present

## 2017-08-28 DIAGNOSIS — E119 Type 2 diabetes mellitus without complications: Secondary | ICD-10-CM | POA: Diagnosis not present

## 2017-08-28 DIAGNOSIS — I5032 Chronic diastolic (congestive) heart failure: Secondary | ICD-10-CM | POA: Diagnosis not present

## 2017-08-28 DIAGNOSIS — I083 Combined rheumatic disorders of mitral, aortic and tricuspid valves: Secondary | ICD-10-CM | POA: Insufficient documentation

## 2017-08-28 NOTE — Telephone Encounter (Signed)
Copied from [place] [id number]. Topic: Inquiry >> Aug 28, 2017 10:47 AM Ether Griffins B wrote: Reason for CRM: pt would like her a copy of labs sent to her [facility].

## 2017-08-28 NOTE — Telephone Encounter (Signed)
Sent by mail per request

## 2017-08-28 NOTE — Telephone Encounter (Signed)
Waiting on [name] to review results

## 2017-09-01 ENCOUNTER — Ambulatory Visit (INDEPENDENT_AMBULATORY_CARE_PROVIDER_SITE_OTHER): Payer: Medicare Other | Admitting: Cardiovascular Disease

## 2017-09-01 ENCOUNTER — Encounter: Payer: Self-pay | Admitting: Cardiovascular Disease

## 2017-09-01 VITALS — BP 130/52 | HR 75 | Ht 60.0 in | Wt 162.0 lb

## 2017-09-01 DIAGNOSIS — R0602 Shortness of breath: Secondary | ICD-10-CM | POA: Diagnosis not present

## 2017-09-01 DIAGNOSIS — I359 Nonrheumatic aortic valve disorder, unspecified: Secondary | ICD-10-CM

## 2017-09-01 DIAGNOSIS — I25119 Atherosclerotic heart disease of native coronary artery with unspecified angina pectoris: Secondary | ICD-10-CM

## 2017-09-01 DIAGNOSIS — I5032 Chronic diastolic (congestive) heart failure: Secondary | ICD-10-CM | POA: Diagnosis not present

## 2017-09-01 NOTE — Progress Notes (Signed)
Cardiology Office Note Date:  09/01/2017   ID:  Sara Garza, DOB [birth year], MRN [id number]  PCP:  [name], NP  Cardiologist:  Sherren Mocha, MD    Chief Complaint  Patient presents with  . Chest Pain  . Shortness of Breath     History of Present Illness: Sara Garza is a 84 y.o. female who presents for  follow-up of CAD, aortic stenosis, and chronic diastolic heart failure.   She underwent for [name] for treatment of severe symptomatic aortic stenosis in 2014. She was treated with a 23 mm Medtronic Corevalve through the moderate risk SurTAVI aortic stenosis trial. She also is followed for moderate coronary artery disease which was evaluated with pressure wire analysis of the RCA and LCx demonstrating no hemodynamic evidence of flow-obstruction. 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  The patient is here alone today.  She remains functionally independent at age 26.  She continues to complain of shortness of breath with low-level activity.  She had to stop several times walking into the office from her car.  Her symptoms have not progressed since she was last seen 6 months ago.  She has not had orthopnea, PND, or resting shortness of breath.  She has occasional chest pain/tightness across the upper chest without radiation.  Feels more limited by back problems, leg problems, and shortness of breath.  Her medicines have not changed.   Past Medical History:  Diagnosis Date  . Arthritis   . CAD (coronary artery disease)   . Carotid stenosis, left    50-60% stable  . Diabetes mellitus   . Glaucoma   . Hyperlipidemia   . Hypertension   . Leg pain    ABIs 2/18: normal bilaterally.  . Osteoporosis     Past Surgical History:  Procedure Laterality Date  . ANOMALOUS PULMONARY VENOUS RETURN REPAIR, TOTAL    . APPENDECTOMY    . CARDIAC VALVE REPLACEMENT  04/29/2012  .  Cataracts Bilateral   . CESAREAN SECTION    . FOOT SURGERY     [name]  . HIP ARTHROPLASTY Right 07/31/2013   Procedure: ARTHROPLASTY BIPOLAR HIP;  Surgeon: [name], MD;  Location: [facility];  Service: Orthopedics;  Laterality: Right;  . PERCUTANEOUS CORONARY STENT INTERVENTION (PCI-S) N/A 01/02/2012   Procedure: PERCUTANEOUS CORONARY STENT INTERVENTION (PCI-S);  Surgeon: Sherren Mocha, MD;  Location[facility] CATH LAB;  Service: Cardiovascular;  Laterality: N/A;    Current Outpatient Medications  Medication Sig Dispense Refill  . amLODipine (NORVASC) 5 MG tablet TAKE 1 TABLET(5 MG) BY MOUTH DAILY 90 tablet 0  . amoxicillin (AMOXIL) 500 MG capsule Take 2,000 mg by mouth as directed. 1 hour prior to dental procedures  1  . aspirin EC 81 MG tablet Take 1 tablet (81 mg total) by mouth daily. Start after lovenox injections are done after 2 weeks    . bimatoprost (LUMIGAN) 0.01 % SOLN Place 1 drop into both eyes at bedtime.     . Calcium Carb-Cholecalciferol (CALCIUM 500 +D) 500-400 MG-UNIT TABS Take 1 tablet by mouth daily.     . carvedilol (COREG) 3.125 MG tablet Take 3.125 mg by mouth 2 (two) times daily with a meal.    . ezetimibe (ZETIA) 10 MG tablet TAKE 1 TABLET(10 MG) BY MOUTH DAILY 30 tablet 7  . furosemide (LASIX) 20 MG tablet Take 0.5 tablets (10 mg total)  by mouth daily. 45 tablet 3  . isosorbide mononitrate (IMDUR) 60 MG 24 hr tablet TAKE 1 TABLET(60 MG) BY MOUTH DAILY 30 tablet 6  . mirabegron ER (MYRBETRIQ) 25 MG TB24 tablet Take 1 tablet (25 mg total) by mouth daily. 90 tablet 2  . Multiple Vitamin (MULTIVITAMIN) tablet Take 1 tablet by mouth daily.       No current facility-administered medications for this visit.     Allergies:   Statins and Gabapentin   Social History:  The patient  reports that she is a non-smoker but has been exposed to tobacco smoke. She has never used smokeless tobacco. She reports that she drinks alcohol. She reports that she does not use drugs.    Family History:  The patient's  family history includes COPD in her father; Cancer in her father; Lung cancer in her unknown relative.    ROS:  Please see the history of present illness.  Otherwise, review of systems is positive for back pain, snoring.  All other systems are reviewed and negative.    PHYSICAL EXAM: VS:  BP (!) 130/52   Pulse 75   Ht 5' (1.524 m)   Wt 162 lb (73.5 kg)   SpO2 95%   BMI 31.64 kg/m  , BMI Body mass index is 31.64 kg/m. GEN: Well nourished, well developed, pleasant elderly woman in no acute distress  HEENT: normal  Neck: no JVD, no masses. No carotid bruits Cardiac: RRR with 2/6 systolic ejection murmur at the right upper sternal border     Respiratory:  clear to auscultation bilaterally, normal work of breathing GI: soft, nontender, nondistended, + BS MS: no deformity or atrophy  Ext: Trace bilateral pretibial edema, pedal pulses 2+= bilaterally Skin: warm and dry, no rash Neuro:  Strength and sensation are intact Psych: euthymic mood, full affect  EKG:  EKG is not ordered today.  Recent Labs: 04/25/2017: TSH 1.70 08/27/2017: ALT 11; BUN 39; Creatinine, Ser 1.44; Hemoglobin 11.5; Platelets 225.0; Potassium 5.2; Sodium 140   Lipid Panel     Component Value Date/Time   CHOL 216 (H) 08/27/2017 1115   TRIG 182.0 (H) 08/27/2017 1115   HDL 45.80 08/27/2017 1115   CHOLHDL 5 08/27/2017 1115   VLDL 36.4 08/27/2017 1115   LDLCALC 134 (H) 08/27/2017 1115   LDLDIRECT 158.6 10/23/2012 1223      Wt Readings from Last 3 Encounters:  09/01/17 162 lb (73.5 kg)  08/27/17 162 lb (73.5 kg)  04/25/17 161 lb (73 kg)     Cardiac Studies Reviewed: Echo 08-28-2017: Left ventricle:  There is a small aneurismal area in the mid septum apparently due to a contained wire perforation during [name] . This aneurismal area has not changed from previous echo. The cavity size was normal. Wall thickness was increased in a pattern of severe LVH. Systolic function was  vigorous. The estimated ejection fraction was in the range of 65% to 70%. Wall motion was normal; there were no regional wall motion abnormalities. Doppler parameters are consistent with abnormal left ventricular relaxation (grade 1 diastolic dysfunction).  ------------------------------------------------------------------- Aortic valve:  A bioprosthesis was present.  Doppler:  There was mild regurgitation.    VTI ratio of LVOT to aortic valve: 0.4. Valve area (VTI): 0.81 cm^2. Indexed valve area (VTI): 0.46 cm^2/m^2. Peak velocity ratio of LVOT to aortic valve: 0.41. Valve area (Vmax): 0.83 cm^2. Indexed valve area (Vmax): 0.47 cm^2/m^2. Mean velocity ratio of LVOT to aortic valve: 0.44. Valve area (Vmean): 0.88 cm^2. Indexed  valve area (Vmean): 0.5 cm^2/m^2. Mean gradient (S): 14 mm Hg. Peak gradient (S): 27 mm Hg.  ------------------------------------------------------------------- Aorta:  Aortic root: The aortic root was normal in size. Ascending aorta: The ascending aorta was normal in size.  ------------------------------------------------------------------- Mitral valve:   Doppler:   The findings are consistent with moderate stenosis.      Valve area by pressure half-time: 1.91 cm^2. Indexed valve area by pressure half-time: 1.09 cm^2/m^2. Valve area by continuity equation (using LVOT flow): 0.72 cm^2. Indexed valve area by continuity equation (using LVOT flow): 0.41 cm^2/m^2.    Mean gradient (D): 10 mm Hg. Peak gradient (D): 7 mm Hg.  ------------------------------------------------------------------- Left atrium:  The atrium was moderately dilated.  ------------------------------------------------------------------- Right ventricle:  The cavity size was normal. Systolic function was normal.  ------------------------------------------------------------------- Pulmonic valve:   Poorly visualized.  The valve appears to be grossly normal.    Doppler:  There was mild  regurgitation.  ------------------------------------------------------------------- Tricuspid valve:   The valve appears to be grossly normal. Doppler:  There was mild regurgitation.  ------------------------------------------------------------------- Pulmonary artery:   Systolic pressure was mildly increased.  ------------------------------------------------------------------- Right atrium:  The atrium was normal in size.  ------------------------------------------------------------------- Pericardium:  A trivial pericardial effusion was identified.  ------------------------------------------------------------------- Systemic veins: Inferior vena cava: The vessel was normal in size. The respirophasic diameter changes were in the normal range (= 50%), consistent with normal central venous pressure.   ASSESSMENT AND PLAN: 1.  Chronic diastolic heart failure, New York Heart Association functional class III symptoms: I reviewed her medical program and will continue current medicines.  She cannot tolerate higher doses of a diuretic.  LV function is preserved based on recent echo which is reviewed today.  Her aortic bioprosthesis appears to be functioning normally with only mild aortic regurgitation unchanged from previous studies.  I encouraged her to get involved with the senior center again as I think it would be good for her overall well-being and she plans to do this.  2.  Aortic valve disease status post [name]: Stable findings on recent echo.  The study is reviewed today.  She has mild paravalvular regurgitation with normal/expected transvalvular gradients.  SBE prophylaxis reviewed.  3.  Coronary artery disease, native vessel, with angina: She is on an antianginal regimen that includes amlodipine, isosorbide, and carvedilol. She has some anginal symptoms which appear stable over time.  More limited by shortness of breath.  She will continue on aspirin for antiplatelet  therapy.  4.  Hypertension: Blood pressure appears well controlled.  5.  Hyperlipidemia: Statin intolerant.  Treated with ezetimibe.  Recent lipids reviewed and above goal.  Considering her advanced age I did not recommend any changes and she is not interested in more aggressive medication program.  Current medicines are reviewed with the patient today.  The patient does not have concerns regarding medicines.  Labs/ tests ordered today include:  No orders of the defined types were placed in this encounter.   Disposition:   FU 6 months  Signed, Sherren Mocha, MD  09/01/2017 1:35 PM    [facility] [address], [place], [place]  [address] Phone: [phone]; Fax: [phone]

## 2017-09-01 NOTE — Patient Instructions (Signed)
Medication Instructions:  1) STOP KRILL OIL  Labwork: None  Testing/Procedures: None  Follow-Up: Your provider wants you to follow-up in: 6 months with Dr. Burt Knack. You will receive a reminder letter in the mail two months in advance. If you don't receive a letter, please call our office to schedule the follow-up appointment.    Any Other Special Instructions Will Be Listed Below (If Applicable).     If you need a refill on your cardiac medications before your next appointment, please call your pharmacy.

## 2017-09-02 ENCOUNTER — Telehealth: Payer: Self-pay | Admitting: *Deleted

## 2017-09-02 NOTE — Telephone Encounter (Signed)
Copied from [place] [phone]. Topic: Inquiry >> Sep 02, 2017  3:18 PM Pricilla Handler wrote: Reason for CRM: Patient called requesting to speak with [place] or Miles Costain. Patient stated that she has questions concerning the lab results she received. Please call patient today at [phone].       Thank You!!!

## 2017-09-02 NOTE — Telephone Encounter (Signed)
Spoke to the pt and informed her of lab results.  No further action required.

## 2017-10-06 ENCOUNTER — Other Ambulatory Visit: Payer: Self-pay | Admitting: Cardiovascular Disease

## 2017-10-07 NOTE — Telephone Encounter (Signed)
Pt's pharmacy is requesting a refill on amoxicillin for dental appt. Would Dr. Burt Knack like to refill this medication? Please address

## 2017-10-10 ENCOUNTER — Other Ambulatory Visit: Payer: Self-pay | Admitting: Adult Health

## 2017-10-10 NOTE — Telephone Encounter (Signed)
Sent to the pharmacy by e-scribe. 

## 2017-10-21 ENCOUNTER — Telehealth: Payer: Self-pay | Admitting: Family Medicine

## 2017-10-21 NOTE — Telephone Encounter (Signed)
Copied from [place] [phone]. Topic: General - Other >> Oct 21, 2017  9:42 AM Judyann Munson wrote: Reason for CRM:  Patient to calling to requesting a samples of  mirabegron ER (MYRBETRIQ) 25 MG TB24 tablet stated she will come in to pick up if available. Please advise

## 2017-10-21 NOTE — Telephone Encounter (Signed)
Ok to try 50 mg tabs. Lets try this before switching. The other medications can cause dizziness which is why I do not like using them except for a last resort

## 2017-10-21 NOTE — Telephone Encounter (Signed)
Sara Garza, spoke to the pt and she was only some what controlled on the 25 mg ER tabs.  She would like to try 50 mg.  Informed her that I will put samples up front and she will come by tomorrow due to the weather..  She did mention the cost of this name brand medication is $80 at the pharmacy.  Would like to have a generic medication sent to the pharmacy if we don't ever have samples.  Oxybutin?  Please advise.

## 2017-11-07 DIAGNOSIS — Z961 Presence of intraocular lens: Secondary | ICD-10-CM | POA: Diagnosis not present

## 2017-11-07 DIAGNOSIS — H353132 Nonexudative age-related macular degeneration, bilateral, intermediate dry stage: Secondary | ICD-10-CM | POA: Diagnosis not present

## 2017-11-07 DIAGNOSIS — H04123 Dry eye syndrome of bilateral lacrimal glands: Secondary | ICD-10-CM | POA: Diagnosis not present

## 2017-11-07 DIAGNOSIS — H401132 Primary open-angle glaucoma, bilateral, moderate stage: Secondary | ICD-10-CM | POA: Diagnosis not present

## 2017-11-25 DIAGNOSIS — H353131 Nonexudative age-related macular degeneration, bilateral, early dry stage: Secondary | ICD-10-CM | POA: Diagnosis not present

## 2017-11-25 DIAGNOSIS — H43813 Vitreous degeneration, bilateral: Secondary | ICD-10-CM | POA: Diagnosis not present

## 2017-12-05 ENCOUNTER — Ambulatory Visit: Payer: Medicare Other

## 2017-12-08 ENCOUNTER — Ambulatory Visit (INDEPENDENT_AMBULATORY_CARE_PROVIDER_SITE_OTHER): Payer: Medicare Other

## 2017-12-08 VITALS — BP 140/70 | HR 65 | Ht 59.0 in | Wt 164.0 lb

## 2017-12-08 DIAGNOSIS — Z0000 Encounter for general adult medical examination without abnormal findings: Secondary | ICD-10-CM | POA: Diagnosis not present

## 2017-12-08 NOTE — Progress Notes (Addendum)
Subjective:   Sara Garza is a 84 y.o. female who presents for Medicare Annual (Subsequent) preventive examination.  Reports health as fair  Last OV was 08/27/2017  S/p [name] 2014    Lives alone  One level home 8 neighbors live down the same street  Good neighbors Has alert and discussed carrying it to the bathroom when she is up 3 to 4 times ; recommended she take her alert alarm with her  Walk in shower Does her own laundry  Cooks some or goes out  Had dtr who is 69 Lives in a community with seniors   May try the [facility] center - has not been lately but thinking about going today   SOB today, with walking; this is her norm States she may have eaten mores sodium lately  Hot dogs; does like salt  Didn't take the lasix today    Diet Chol/hdl 5 Trig 182 A1c 6.2  Has OJ, cup of tea, mini crumb bun Lunch; has ham or roast beef on an english muffin Supper - ham or roast beef  Sweets during the day  Likes to eat with a person (educated that with pre-diabetes; OJ has a lot of sugar and may choose to dilute or decrease)  BMI 32   Exercise Up and down in the home Will go to [place] increased myrbetric from 25 to 50   Diabetic eye exam 04/2017  Mammogram 03/2016 but States she had her last one January of this year  Osteopenic but declined medicine in the past  Foot exam completed States she was diabetic during times of illness or stress At one point, her BS was dropping to low and this was stopped    There are no preventive care reminders to display for this patient.  Cardiac Risk Factors include: advanced age (>51men, >65 women);family history of premature cardiovascular disease;hypertension;obesity (BMI >30kg/m2);sedentary lifestyle      Objective:     Vitals: BP 140/70   Pulse 65   Ht 4\' 11"  (1.499 m)   Wt 164 lb (74.4 kg)   SpO2 94%   BMI 33.12 kg/m   Body mass index is 33.12 kg/m.  Advanced Directives  12/08/2017 12/04/2016 08/22/2016 06/02/2016 04/21/2015 04/14/2015 03/07/2015  Does Patient Have a Medical Advance Directive? Yes Yes Yes Yes Yes Yes Yes  Type of Advance Directive - - [place];Living will;Out of facility DNR (pink MOST or yellow form) Living will;Healthcare Power of Attorney Living will;Healthcare Power of Attorney Living will;Healthcare Power of Attorney Living will;Healthcare Power of Attorney  Does patient want to make changes to medical advance directive? - - - - - - No - Patient declined  Copy of [place] in Chart? - - - No - copy requested No - copy requested No - copy requested No - copy requested  Pre-existing out of facility DNR order (yellow form or pink MOST form) - - - - - - -    Tobacco Social History   Tobacco Use  Smoking Status Passive Smoke Exposure - Never Smoker  Smokeless Tobacco Never Used     Counseling given: Yes   Clinical Intake:      Past Medical History:  Diagnosis Date  . Arthritis   . CAD (coronary artery disease)   . Carotid stenosis, left    50-60% stable  . Diabetes mellitus   . Glaucoma   . Hyperlipidemia   . Hypertension   .  Leg pain    ABIs 2/18: normal bilaterally.  . Osteoporosis    Past Surgical History:  Procedure Laterality Date  . ANOMALOUS PULMONARY VENOUS RETURN REPAIR, TOTAL    . APPENDECTOMY    . CARDIAC VALVE REPLACEMENT  04/29/2012  . Cataracts Bilateral   . CESAREAN SECTION    . FOOT SURGERY     [name]  . HIP ARTHROPLASTY Right 07/31/2013   Procedure: ARTHROPLASTY BIPOLAR HIP;  Surgeon: [name], MD;  Location: [facility];  Service: Orthopedics;  Laterality: Right;  . PERCUTANEOUS CORONARY STENT INTERVENTION (PCI-S) N/A 01/02/2012   Procedure: PERCUTANEOUS CORONARY STENT INTERVENTION (PCI-S);  Surgeon: Sherren Mocha, MD;  Location: [facility] CATH LAB;  Service: Cardiovascular;  Laterality: N/A;   Family History  Problem Relation Age of Onset  . COPD Father   . Cancer Father          lung cancer  . Lung cancer Unknown    Social History   Socioeconomic History  . Marital status: Widowed    Spouse name: Not on file  . Number of children: Not on file  . Years of education: Not on file  . Highest education level: Not on file  Occupational History  . Not on file  Social Needs  . Financial resource strain: Not on file  . Food insecurity:    Worry: Not on file    Inability: Not on file  . Transportation needs:    Medical: Not on file    Non-medical: Not on file  Tobacco Use  . Smoking status: Passive Smoke Exposure - Never Smoker  . Smokeless tobacco: Never Used  Substance and Sexual Activity  . Alcohol use: Yes    Alcohol/week: 0.0 standard drinks    Comment: very seldom  . Drug use: No  . Sexual activity: Not Currently  Lifestyle  . Physical activity:    Days per week: Not on file    Minutes per session: Not on file  . Stress: Not on file  Relationships  . Social connections:    Talks on phone: Not on file    Gets together: Not on file    Attends religious service: Not on file    Active member of club or organization: Not on file    Attends meetings of clubs or organizations: Not on file    Relationship status: Not on file  Other Topics Concern  . Not on file  Social History Narrative   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      She likes to read and she takes classes at [employer] Encounter Medications as of 12/08/2017  Medication Sig  . amLODipine (NORVASC) 5 MG tablet TAKE 1 TABLET(5 MG) BY MOUTH DAILY  . aspirin EC 81 MG tablet Take 1 tablet (81 mg total) by mouth daily. Start after lovenox injections are done after 2 weeks  . bimatoprost (LUMIGAN) 0.01 % SOLN Place 1 drop into both eyes at bedtime.   . Calcium Carb-Cholecalciferol (CALCIUM 500 +D) 500-400 MG-UNIT TABS Take 1 tablet by mouth daily.   . carvedilol (COREG) 3.125 MG tablet Take 3.125 mg by mouth 2 (two) times daily with  a meal.  . ezetimibe (ZETIA) 10 MG tablet TAKE 1 TABLET(10 MG) BY MOUTH DAILY  . furosemide (LASIX) 20 MG tablet Take 0.5 tablets (10 mg total) by mouth daily.  . isosorbide mononitrate (IMDUR) 60 MG 24 hr  tablet TAKE 1 TABLET(60 MG) BY MOUTH DAILY  . mirabegron ER (MYRBETRIQ) 50 MG TB24 tablet Take 50 mg by mouth daily.  . Multiple Vitamin (MULTIVITAMIN) tablet Take 1 tablet by mouth daily.    [name] amoxicillin (AMOXIL) 500 MG capsule TAKE 4 CAPSULES BY MOUTH 1 HOUR PRIOR TO DENTAL PROCEDURES (Patient not taking: Reported on 12/08/2017)   No facility-administered encounter medications on file as of 12/08/2017.     Activities of Daily Living In your present state of health, do you have any difficulty performing the following activities: 12/08/2017  Hearing? N  Vision? N  Difficulty concentrating or making decisions? N  Walking or climbing stairs? Y  Dressing or bathing? N  Doing errands, shopping? N  Preparing Food and eating ? N  Using the Toilet? N  In the past six months, have you accidently leaked urine? Y  Comment on medicine  Do you have problems with loss of bowel control? N  Managing your Medications? N  Managing your Finances? N  Housekeeping or managing your Housekeeping? N  Some recent data might be hidden    Patient Care Team: [name], NP as PCP - General (Family Medicine) Sherren Mocha, MD (Cardiology) Suella Broad, MD as Consulting Physician (Physical Medicine and Rehabilitation)    Assessment:   This is a routine wellness examination for Alicen.  Exercise Activities and Dietary recommendations Current Exercise Habits: Home exercise routine(may go to the senior center )  Goals    . DIET - INCREASE WATER INTAKE     Increase water throughout the day; little by little    . patient     Continue to get out of the home 3 to 4 times a week Maybe eat lunch out 2 times a week  Continue to go to the [facility]    Try to add some variety to your diet         Fall Risk Fall Risk  12/08/2017 08/27/2017 08/27/2017 12/04/2016 01/06/2015  Falls in the past year? No Yes Yes No Yes  Number falls in past yr: - 2 or more 2 or more - 2 or more  Injury with Fall? - No - - Yes  Risk for fall due to : - Impaired mobility;Impaired balance/gait - - -  Follow up - Falls evaluation completed;Education provided;Falls prevention discussed;Follow up appointment - - -    Depression Screen PHQ 2/9 Scores 12/08/2017 08/27/2017 12/04/2016 01/06/2015  PHQ - 2 Score 0 0 0 0     Cognitive Function MMSE - Mini Mental State Exam 12/08/2017 12/04/2016  Not completed: (No Data) (No Data)     Ad8 score reviewed for issues:  Issues making decisions:  Less interest in hobbies / activities:  Repeats questions, stories (family complaining):  Trouble using ordinary gadgets (microwave, computer, phone):  Forgets the month or year:   Mismanaging finances:   Remembering appts:  Daily problems with thinking and/or memory: Ad8 score is=0    Immunization History  Administered Date(s) Administered  . Hepatitis A 10/09/2005  . Influenza Split 01/02/2011, 12/24/2011  . Influenza Whole 04/01/2005, 01/01/2007, 01/02/2008, 01/06/2009, 01/17/2010  . Influenza, High Dose Seasonal PF 01/04/2014, 01/06/2015, 01/09/2016, 01/07/2017  . Influenza,inj,Quad PF,6+ Mos 12/25/2012  . Pneumococcal Conjugate-13 02/22/2013  . Pneumococcal Polysaccharide-23 04/01/2005  . Td 04/01/2000  . Tetanus 07/16/2013  . Zoster 10/09/2005      Screening Tests Health Maintenance  Topic Date Due  . URINE MICROALBUMIN  12/29/2017 (Originally 06/18/2017)  . INFLUENZA VACCINE  01/29/2018 (  Originally 10/30/2017)  . HEMOGLOBIN A1C  02/27/2018  . OPHTHALMOLOGY EXAM  04/03/2018  . FOOT EXAM  12/09/2018  . TETANUS/TDAP  07/17/2023  . DEXA SCAN  Completed  . PNA vac Low Risk Adult  Completed  . MAMMOGRAM  Discontinued         Plan:      PCP Notes   Health Maintenance Diabetic eye exam 04/2017   Mammogram 03/2016 but States she had her last one January of this year  Osteopenic but declined medicine in the past  Declined flu vaccine until early October ) Foot exam completed States she was diabetic during times of illness or stress At one point, her BS was dropping to low and metformin was stopped. A1c 6.2 at last blood draw.   Abnormal Screens  The patient was sob when coming to the room. States she has felt "dizzy" but then stated she was fine. Stated she didn't "feel very good. Weight stable  Rate 65 and regular., left BP 94/50 and bp on right 74/40 States she is fine, but then became nauseated with dry heaves Did not vomit.  Episode was transient. Declined 911 and stated she felt better;  Ate very little this am and took BP med on an empty stomach.  Dr. Sherren Mocha came in to evaluate. Given water and admitted taking her BP meds on an empty stomach, followed with small bagel this am Also had no water to drink yesterday. States dtr is a Designer, jewellery and tells her to drink water all the time. Dr. Sherren Mocha rechecked BP 140 / 70 and was nor Dx vagal nerve response.  Will get Omron   Educated to take her BP meds with fluids and food;  Educated to drink water at intervals throughout the day and continue to increase her tolerance Per Dr. Sherren Mocha, To check BP with Omron cuff bid (am and Pm) and schedule with [name] NP in 10 days. [employer], her dtr called and given these instructions as well. 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 Medical and social history . Use of alcohol, tobacco or illicit drugs  . Current medications and supplements . Functional ability and  status . Nutritional status . Physical activity . Advanced directives . List of other physicians . Hospitalizations, surgeries, and ER visits in previous 12 months . Vitals . Screenings to include cognitive, depression, and falls . Referrals and appointments  In addition, I have reviewed and discussed with patient certain preventive protocols, quality metrics, and best practice recommendations. A written personalized care plan for preventive services as well as general preventive health recommendations were provided to patient.     Wynetta Fines, RN  12/08/2017   Sent to [employer] to sign off/ Wynetta Fines RN

## 2017-12-08 NOTE — Patient Instructions (Addendum)
[name] ,  Thank you for taking time to come for your Medicare Wellness Visit. I appreciate your ongoing commitment to your health goals. Please review the following plan we discussed and let me know if I can assist you in the future.   Per Dr. Todd Needs an Omran BP cuff and check BP in the morning and the evening x 10 days and come back to see Cory to recheck and fup.  Try to drink sips all day long as it will take several days to rehydrate.  Please watch your sodium intake  Try to eat clean, chicken, fish, eggs or turkey   Will take your flu vaccine no later than early October. Keep in mind the flu shot is an inactivated vaccine and takes at least 2 weeks to build immunity. The flu virus can be dormant for 4 days prior to symptoms Taking the flu shot at the beginning of the season can reduce the risk for the entire community.   May check with other pharmacies for shingrix  Shingrix is a vaccine for the prevention of Shingles in Adults 50 and older.  If you are on Medicare, the shingrix is covered under your Part D plan, so you will take both of the vaccines in the series at your pharmacy. Please check with your benefits regarding applicable copays or out of pocket expenses.  The Shingrix is given in 2 vaccines approx 8 weeks apart. You must receive the 2nd dose prior to 6 months from receipt of the first. Please have the pharmacist print out you Immunization  dates for our office records     These are the goals we discussed: Goals    . patient     Continue to get out of the home 3 to 4 times a week Maybe eat lunch out 2 times a week  Continue to go to the [facility]    Try to add some variety to your diet        This is a list of the screening recommended for you and due dates:  Health Maintenance  Topic Date Due  . Urine Protein Check  06/18/2017  . Flu Shot  10/30/2017  . Complete foot exam   12/04/2017  . Hemoglobin A1C  02/27/2018  . Eye exam for diabetics   04/03/2018  . Tetanus Vaccine  07/17/2023  . DEXA scan (bone density measurement)  Completed  . Pneumonia vaccines  Completed  . Mammogram  Discontinued     DASH Eating Plan DASH stands for "Dietary Approaches to Stop Hypertension." The DASH eating plan is a healthy eating plan that has been shown to reduce high blood pressure (hypertension). It may also reduce your risk for type 2 diabetes, heart disease, and stroke. The DASH eating plan may also help with weight loss. What are tips for following this plan? General guidelines  Avoid eating more than 2,300 mg (milligrams) of salt (sodium) a day. If you have hypertension, you may need to reduce your sodium intake to 1,500 mg a day.  Limit alcohol intake to no more than 1 drink a day for nonpregnant women and 2 drinks a day for men. One drink equals 12 oz of beer, 5 oz of wine, or 1 oz of hard liquor.  Work with your health care provider to maintain a healthy body weight or to lose weight. Ask what an ideal weight is for you.  Get at least 30 minutes of exercise that causes your heart to beat   faster (aerobic exercise) most days of the week. Activities may include walking, swimming, or biking.  Work with your health care provider or diet and nutrition specialist (dietitian) to adjust your eating plan to your individual calorie needs. Reading food labels  Check food labels for the amount of sodium per serving. Choose foods with less than 5 percent of the Daily Value of sodium. Generally, foods with less than 300 mg of sodium per serving fit into this eating plan.  To find whole grains, look for the word "whole" as the first word in the ingredient list. Shopping  Buy products labeled as "low-sodium" or "no salt added."  Buy fresh foods. Avoid canned foods and premade or frozen meals. Cooking  Avoid adding salt when cooking. Use salt-free seasonings or herbs instead of table salt or sea salt. Check with your health care provider or  pharmacist before using salt substitutes.  Do not fry foods. Cook foods using healthy methods such as baking, boiling, grilling, and broiling instead.  Cook with heart-healthy oils, such as olive, canola, soybean, or sunflower oil. Meal planning   Eat a balanced diet that includes: ? 5 or more servings of fruits and vegetables each day. At each meal, try to fill half of your plate with fruits and vegetables. ? Up to 6-8 servings of whole grains each day. ? Less than 6 oz of lean meat, poultry, or fish each day. A 3-oz serving of meat is about the same size as a deck of cards. One egg equals 1 oz. ? 2 servings of low-fat dairy each day. ? A serving of nuts, seeds, or beans 5 times each week. ? Heart-healthy fats. Healthy fats called Omega-3 fatty acids are found in foods such as flaxseeds and coldwater fish, like sardines, salmon, and mackerel.  Limit how much you eat of the following: ? Canned or prepackaged foods. ? Food that is high in trans fat, such as fried foods. ? Food that is high in saturated fat, such as fatty meat. ? Sweets, desserts, sugary drinks, and other foods with added sugar. ? Full-fat dairy products.  Do not salt foods before eating.  Try to eat at least 2 vegetarian meals each week.  Eat more home-cooked food and less restaurant, buffet, and fast food.  When eating at a restaurant, ask that your food be prepared with less salt or no salt, if possible. What foods are recommended? The items listed may not be a complete list. Talk with your dietitian about what dietary choices are best for you. Grains Whole-grain or whole-wheat bread. Whole-grain or whole-wheat pasta. Brown rice. [place]. Bulgur. Whole-grain and low-sodium cereals. Pita bread. Low-fat, low-sodium crackers. Whole-wheat flour tortillas. Vegetables Fresh or frozen vegetables (raw, steamed, roasted, or grilled). Low-sodium or reduced-sodium tomato and vegetable juice. Low-sodium or  reduced-sodium tomato sauce and tomato paste. Low-sodium or reduced-sodium canned vegetables. Fruits All fresh, dried, or frozen fruit. Canned fruit in natural juice (without added sugar). Meat and other protein foods Skinless chicken or [place]. Ground chicken or [place]. Pork with fat trimmed off. Fish and seafood. Egg whites. Dried beans, peas, or lentils. Unsalted nuts, nut butters, and seeds. Unsalted canned beans. Lean cuts of beef with fat trimmed off. Low-sodium, lean deli meat. Dairy Low-fat (1%) or fat-free (skim) milk. Fat-free, low-fat, or reduced-fat cheeses. Nonfat, low-sodium ricotta or cottage cheese. Low-fat or nonfat yogurt. Low-fat, low-sodium cheese. Fats and oils Soft margarine without trans fats. Vegetable oil. Low-fat, reduced-fat, or light mayonnaise and salad dressings (reduced-sodium).  Canola, safflower, olive, soybean, and sunflower oils. Avocado. Seasoning and other foods Herbs. Spices. Seasoning mixes without salt. Unsalted popcorn and pretzels. Fat-free sweets. What foods are not recommended? The items listed may not be a complete list. Talk with your dietitian about what dietary choices are best for you. Grains Baked goods made with fat, such as croissants, muffins, or some breads. Dry pasta or rice meal packs. Vegetables Creamed or fried vegetables. Vegetables in a cheese sauce. Regular canned vegetables (not low-sodium or reduced-sodium). Regular canned tomato sauce and paste (not low-sodium or reduced-sodium). Regular tomato and vegetable juice (not low-sodium or reduced-sodium). Pickles. Olives. Fruits Canned fruit in a light or heavy syrup. Fried fruit. Fruit in cream or butter sauce. Meat and other protein foods Fatty cuts of meat. Ribs. Fried meat. Bacon. Sausage. Bologna and other processed lunch meats. Salami. Fatback. Hotdogs. Bratwurst. Salted nuts and seeds. Canned beans with added salt. Canned or smoked fish. Whole eggs or egg yolks. Chicken or turkey  with skin. Dairy Whole or 2% milk, cream, and half-and-half. Whole or full-fat cream cheese. Whole-fat or sweetened yogurt. Full-fat cheese. Nondairy creamers. Whipped toppings. Processed cheese and cheese spreads. Fats and oils Butter. Stick margarine. Lard. Shortening. Ghee. Bacon fat. Tropical oils, such as coconut, palm kernel, or palm oil. Seasoning and other foods Salted popcorn and pretzels. Onion salt, garlic salt, seasoned salt, table salt, and sea salt. Worcestershire sauce. Tartar sauce. Barbecue sauce. Teriyaki sauce. Soy sauce, including reduced-sodium. Steak sauce. Canned and packaged gravies. Fish sauce. Oyster sauce. Cocktail sauce. Horseradish that you find on the shelf. Ketchup. Mustard. Meat flavorings and tenderizers. Bouillon cubes. Hot sauce and Tabasco sauce. Premade or packaged marinades. Premade or packaged taco seasonings. Relishes. Regular salad dressings. Where to find more information:  National Heart, Lung, and Blood Institute: www.nhlbi.nih.gov  American Heart Association: www.heart.org Summary  The DASH eating plan is a healthy eating plan that has been shown to reduce high blood pressure (hypertension). It may also reduce your risk for type 2 diabetes, heart disease, and stroke.  With the DASH eating plan, you should limit salt (sodium) intake to 2,300 mg a day. If you have hypertension, you may need to reduce your sodium intake to 1,500 mg a day.  When on the DASH eating plan, aim to eat more fresh fruits and vegetables, whole grains, lean proteins, low-fat dairy, and heart-healthy fats.  Work with your health care provider or diet and nutrition specialist (dietitian) to adjust your eating plan to your individual calorie needs. This information is not intended to replace advice given to you by your health care provider. Make sure you discuss any questions you have with your health care provider. Document Released: 03/07/2011 Document Revised: 03/11/2016  Document Reviewed: 03/11/2016 Elsevier Interactive Patient Education  2018 Elsevier Inc.   Community Programs Location  Contact Information Description of Services Cost  A Matter of Balance Class locations vary. Call [facility] Area Agency on Aging for more information.  www.ptrc.org [phone] 8-Session program addressing the fear of falling and increasing activity levels of older adults Free to minimal cost  [facility] By [place] [address], [place], [place] [address].  www.actbydeesenc.com [phone]  Personal training, gym, classes including Silver Sneakers* and ACTion for Aging Adults Fee-based  A.H.O.Y. (Add Health to Our Years) Airs on [employer] Channel 13, M-F at 8am and 1pm  [place] Locations: [facility],  [address] [facility], [address] [place] [address]   [facility],  [place], [place], [address] [facility], [place], [place], [place] [address]  [place] Location: Sharrell Ku. [employer] [place]      [phone]  [phone]  [phone]  [phone]  [phone]  [phone]  [phone]  [phone]  [phone]  [phone]    [phone] A total-body conditioning class for adults 45 and older; designed to increase muscular strength, endurance, range of movement, flexibility, balance, agility and coordination Free  [facility] [place], [place] [address] [place]      [address]      [phone]      Pilate's class for individualsreturning to exercise after an injury, before or after surgery or for individuals with complex musculoskeletal  issues; designed to improve strength, balance , flexibility      $15/class  [place]lie [address] [place], [place] [address] www.[email or web address] [place] classes for beginners to advanced [place] [address], [place] [address] [email or web address]-[email or web address].[email or web address] [place] Chair Exercises Free, ages 89 and older; Ages 35-59 fee based  Marvia Pickles, [facility] [address] [place], [place] [address] Seniorcenter_1 .Beverlee Nims [phone]  A.H.O.Y. Tai Chi Fee-based Donation based or free  [place] Class locations vary.  Call or email Angela Burke or view website for more information. Info_[email or web address] .com GainPain.com.cy.html [phone] Ongoing classes at local YMCAs and gyms Fee-based  Silver Sneakers A[facility] [place] [employer]: [facility] [place] [phone]  [phone] Classes designed for older adults who want to improve their strength, flexibility, balance and endurance.   Silver sneakers is covered by some insurance plans and includes a fitness center membership at participating locations. Find out more by calling [phone] or visiting www.silversneakers.com Covered by some insurance plans  [facility] Christine [phone] A.H.O.Y., fitness room, personal training, fitness classes for injury prevention, strength, balance, flexibility, water fitness classes  Ages 55+: $63 for 6 months; Ages 71-54: $27 for 6 months  Tai Chi for Everybody [facility] [address] [place], [place] [address] Taichiforeverybody_3 .Patsi Sears [phone] Tai Chi classes for beginners to advanced; geared for seniors Donation Based      [employer] (Helpling Others Participate in Exercise     Loyal Gambler. Rosana Hoes, PhD, [place] pgdavis_[email or web address] .edu [place]     [phone]     A comprehensive fitness program for adults.  The program paris senior-level undergraduates Kinesiology students with adults who desire to learn how to exercise safely.  Includes a structural exercise class focusing on functional fitnesss     $100/semester in fall and spring; $75 in summer (no trainers)    *Silver Sneakers is covered by some Personal assistant and includes a  Radio producer at participating locations.  Find out more by calling [phone] or visiting [email or web address]  For additional health and human services resources  for senior adults, please contact SeniorLine at [phone] in [place] and [place] at [phone] in all other areas.

## 2017-12-09 ENCOUNTER — Other Ambulatory Visit: Payer: Self-pay | Admitting: Cardiovascular Disease

## 2017-12-09 DIAGNOSIS — I359 Nonrheumatic aortic valve disorder, unspecified: Secondary | ICD-10-CM

## 2017-12-09 DIAGNOSIS — I25119 Atherosclerotic heart disease of native coronary artery with unspecified angina pectoris: Secondary | ICD-10-CM

## 2017-12-09 DIAGNOSIS — I10 Essential (primary) hypertension: Secondary | ICD-10-CM

## 2017-12-14 ENCOUNTER — Other Ambulatory Visit: Payer: Self-pay | Admitting: Adult Health

## 2017-12-14 ENCOUNTER — Other Ambulatory Visit: Payer: Self-pay | Admitting: Cardiovascular Disease

## 2017-12-14 DIAGNOSIS — I359 Nonrheumatic aortic valve disorder, unspecified: Secondary | ICD-10-CM

## 2017-12-14 DIAGNOSIS — I10 Essential (primary) hypertension: Secondary | ICD-10-CM

## 2017-12-14 DIAGNOSIS — I25119 Atherosclerotic heart disease of native coronary artery with unspecified angina pectoris: Secondary | ICD-10-CM

## 2017-12-15 NOTE — Telephone Encounter (Signed)
Outpatient Medication Detail    Disp Refills Start End   isosorbide mononitrate (IMDUR) 60 MG 24 hr tablet 90 tablet 2 12/09/2017    Sig: TAKE 1 TABLET(60 MG) BY MOUTH DAILY   Sent to pharmacy as: isosorbide mononitrate (IMDUR) 60 MG 24 hr tablet   Notes to Pharmacy: **Patient requests 90 days supply**   E-Prescribing Status: Receipt confirmed by pharmacy (12/09/2017 10:26 AM EDT)   Associated Diagnoses   Aortic valve disorder     Essential hypertension     Coronary artery disease involving native coronary artery of native heart with angina pectoris [facility])     [place] AT [place]

## 2017-12-16 NOTE — Telephone Encounter (Signed)
Left a message for a return call.  Unsure if pt is taking this medication.

## 2017-12-16 NOTE — Telephone Encounter (Signed)
Pt returned call

## 2017-12-17 NOTE — Telephone Encounter (Signed)
Sent to the pharmacy by e-scribe. 

## 2017-12-17 NOTE — Telephone Encounter (Signed)
Left a message for a return call.

## 2017-12-25 ENCOUNTER — Encounter: Payer: Self-pay | Admitting: Adult Health

## 2017-12-25 ENCOUNTER — Ambulatory Visit (INDEPENDENT_AMBULATORY_CARE_PROVIDER_SITE_OTHER): Payer: Medicare Other | Admitting: Adult Health

## 2017-12-25 VITALS — BP 120/64 | HR 76 | Temp 97.7°F | Wt 162.0 lb

## 2017-12-25 DIAGNOSIS — Z23 Encounter for immunization: Secondary | ICD-10-CM | POA: Diagnosis not present

## 2017-12-25 DIAGNOSIS — I5032 Chronic diastolic (congestive) heart failure: Secondary | ICD-10-CM

## 2017-12-25 DIAGNOSIS — R5383 Other fatigue: Secondary | ICD-10-CM

## 2017-12-25 DIAGNOSIS — I25119 Atherosclerotic heart disease of native coronary artery with unspecified angina pectoris: Secondary | ICD-10-CM | POA: Diagnosis not present

## 2017-12-25 LAB — CBC WITH DIFFERENTIAL/PLATELET
BASOS PCT: 1.1 % (ref 0.0–3.0)
Basophils Absolute: 0.1 10*3/uL (ref 0.0–0.1)
EOS ABS: 0.2 10*3/uL (ref 0.0–0.7)
Eosinophils Relative: 4.4 % (ref 0.0–5.0)
HEMATOCRIT: 35.1 % — AB (ref 36.0–46.0)
Hemoglobin: 11.5 g/dL — ABNORMAL LOW (ref 12.0–15.0)
LYMPHS ABS: 1.1 10*3/uL (ref 0.7–4.0)
Lymphocytes Relative: 21.1 % (ref 12.0–46.0)
MCHC: 32.9 g/dL (ref 30.0–36.0)
MCV: 90.5 fl (ref 78.0–100.0)
MONO ABS: 0.6 10*3/uL (ref 0.1–1.0)
Monocytes Relative: 11.9 % (ref 3.0–12.0)
NEUTROS PCT: 61.5 % (ref 43.0–77.0)
Neutro Abs: 3.2 10*3/uL (ref 1.4–7.7)
Platelets: 277 10*3/uL (ref 150.0–400.0)
RBC: 3.87 Mil/uL (ref 3.87–5.11)
RDW: 13.7 % (ref 11.5–15.5)
WBC: 5.3 10*3/uL (ref 4.0–10.5)

## 2017-12-25 LAB — BASIC METABOLIC PANEL
BUN: 30 mg/dL — AB (ref 6–23)
CHLORIDE: 105 meq/L (ref 96–112)
CO2: 29 mEq/L (ref 19–32)
CREATININE: 1.2 mg/dL (ref 0.40–1.20)
Calcium: 9.5 mg/dL (ref 8.4–10.5)
GFR: 44.6 mL/min — ABNORMAL LOW (ref >60.00)
Glucose, Bld: 87 mg/dL (ref 70–99)
Potassium: 4.6 mEq/L (ref 3.5–5.1)
Sodium: 141 mEq/L (ref 135–145)

## 2017-12-25 LAB — BRAIN NATRIURETIC PEPTIDE: PRO B NATRI PEPTIDE: 356 pg/mL — AB (ref 0.0–100.0)

## 2017-12-25 LAB — TSH: TSH: 1.62 u[IU]/mL (ref 0.35–4.50)

## 2017-12-25 NOTE — Progress Notes (Signed)
Subjective:    Patient ID: Sara Garza, female    DOB: June 14, 1923, 84 y.o.   MRN: [id number]  HPI  84 year old female who  has a past medical history of Arthritis, CAD (coronary artery disease), Carotid stenosis, left, Diabetes mellitus, Glaucoma, Hyperlipidemia, Hypertension, Leg pain, and Osteoporosis.  She presents for follow-up after being seen in the office proximately 2 weeks ago for her annual wellness visit.  During her annual wellness visit she is complaining of shortness of breath and felt dizzy.  On exam her BP was 94/50 on her left arm and then on her right 74/40, she felt this and had dry heaves.  She did report taking her blood pressure medication on empty stomach.  Recheck later that morning her blood pressure is 140/70.  It was thought she had a vagal response.  She advised to check her blood pressure at home which she is been doing and reports blood pressures between 1 20-1 40 over 70s.  She has not felt like that since that episode.  Complaint today is chronic fatigue.  This is been an ongoing issue for some time now.  She denies any shortness of breath, chest pain, or any lower extremity edema   Review of Systems See HPI   Past Medical History:  Diagnosis Date  . Arthritis   . CAD (coronary artery disease)   . Carotid stenosis, left    50-60% stable  . Diabetes mellitus   . Glaucoma   . Hyperlipidemia   . Hypertension   . Leg pain    ABIs 2/18: normal bilaterally.  . Osteoporosis     Social History   Socioeconomic History  . Marital status: Widowed    Spouse name: Not on file  . Number of children: Not on file  . Years of education: Not on file  . Highest education level: Not on file  Occupational History  . Not on file  Social Needs  . Financial resource strain: Not on file  . Food insecurity:    Worry: Not on file    Inability: Not on file  . Transportation needs:    Medical: Not on file    Non-medical: Not on file  Tobacco Use  . Smoking  status: Passive Smoke Exposure - Never Smoker  . Smokeless tobacco: Never Used  Substance and Sexual Activity  . Alcohol use: Yes    Alcohol/week: 0.0 standard drinks    Comment: very seldom  . Drug use: No  . Sexual activity: Not Currently  Lifestyle  . Physical activity:    Days per week: Not on file    Minutes per session: Not on file  . Stress: Not on file  Relationships  . Social connections:    Talks on phone: Not on file    Gets together: Not on file    Attends religious service: Not on file    Active member of club or organization: Not on file    Attends meetings of clubs or organizations: Not on file    Relationship status: Not on file  . Intimate partner violence:    Fear of current or ex partner: Not on file    Emotionally abused: Not on file    Physically abused: Not on file    Forced sexual activity: Not on file  Other Topics Concern  . Not on file  Social History Narrative   She worked as a Insurance account manager for 10 years  Has one daughter      She likes to read and she takes classes at [facility]    Past Surgical History:  Procedure Laterality Date  . ANOMALOUS PULMONARY VENOUS RETURN REPAIR, TOTAL    . APPENDECTOMY    . CARDIAC VALVE REPLACEMENT  04/29/2012  . Cataracts Bilateral   . CESAREAN SECTION    . FOOT SURGERY     [name]  . HIP ARTHROPLASTY Right 07/31/2013   Procedure: ARTHROPLASTY BIPOLAR HIP;  Surgeon: [name], MD;  Location: [facility];  Service: Orthopedics;  Laterality: Right;  . PERCUTANEOUS CORONARY STENT INTERVENTION (PCI-S) N/A 01/02/2012   Procedure: PERCUTANEOUS CORONARY STENT INTERVENTION (PCI-S);  Surgeon: Sherren Mocha, MD;  Location: [facility] C[facility] LAB;  Service: Cardiovascular;  Laterality: N/A;    Family History  Problem Relation Age of Onset  . COPD Father   . Cancer Father        lung cancer  . Lung cancer Unknown     Allergies  Allergen Reactions  . Statins Other (See Comments)    Muscle aches  .  Gabapentin Other (See Comments)    SOMNOLENCE    Current Outpatient Medications on File Prior to Visit  Medication Sig Dispense Refill  . amLODipine (NORVASC) 5 MG tablet TAKE 1 TABLET(5 MG) BY MOUTH DAILY 90 tablet 2  . amoxicillin (AMOXIL) 500 MG capsule TAKE 4 CAPSULES BY MOUTH 1 HOUR PRIOR TO DENTAL PROCEDURES 8 capsule 6  . aspirin EC 81 MG tablet Take 1 tablet (81 mg total) by mouth daily. Start after lovenox injections are done after 2 weeks    . bimatoprost (LUMIGAN) 0.01 % SOLN Place 1 drop into both eyes at bedtime.     . Calcium Carb-Cholecalciferol (CALCIUM 500 +D) 500-400 MG-UNIT TABS Take 1 tablet by mouth daily.     . carvedilol (COREG) 3.125 MG tablet Take 3.125 mg by mouth 2 (two) times daily with a meal.    . carvedilol (COREG) 3.125 MG tablet TAKE 1 TABLET(3.125 MG) BY MOUTH TWICE DAILY WITH A MEAL 180 tablet 2  . ezetimibe (ZETIA) 10 MG tablet TAKE 1 TABLET(10 MG) BY MOUTH DAILY 30 tablet 7  . furosemide (LASIX) 20 MG tablet Take 0.5 tablets (10 mg total) by mouth daily. 45 tablet 3  . isosorbide mononitrate (IMDUR) 60 MG 24 hr tablet TAKE 1 TABLET(60 MG) BY MOUTH DAILY 90 tablet 2  . Multiple Vitamin (MULTIVITAMIN) tablet Take 1 tablet by mouth daily.       No current facility-administered medications on file prior to visit.     BP 120/64 (BP Location: Left Arm, Patient Position: Sitting, Cuff Size: Normal)   Pulse 76   Temp 97.7 F (36.5 C) (Oral)   Wt 162 lb (73.5 kg)   SpO2 92%   BMI 32.72 kg/m       Objective:   Physical Exam  Constitutional: She is oriented to person, place, and time. She appears well-developed and well-nourished. No distress.  Cardiovascular: Normal rate, regular rhythm, normal heart sounds and intact distal pulses.  Pulmonary/Chest: Effort normal and breath sounds normal.  Musculoskeletal: She exhibits edema (BLE non pitting edema ).  Neurological: She is alert and oriented to person, place, and time.  Skin: Skin is warm and dry. She  is not diaphoretic.  Psychiatric: She has a normal mood and affect. Her behavior is normal. Judgment and thought content normal.  Nursing note and vitals reviewed.     Assessment & Plan:  1. Fatigue,  unspecified type - likely multi factoria; age, cardiac conditions - Will check labs  - Brain Natriuretic Peptide - TSH - Basic Metabolic Panel - CBC with Differential/Platelet - Iron, TIBC and Ferritin Panel  2. Chronic diastolic heart failure (HCC)  - Brain Natriuretic Peptide - Basic Metabolic Panel - CBC with Differential/Platelet  3. Need for influenza vaccination  - Flu vaccine HIGH DOSE PF (Fluzone High dose)   [name], NP

## 2017-12-26 LAB — IRON,TIBC AND FERRITIN PANEL
%SAT: 22 % (ref 16–45)
FERRITIN: 88 ng/mL (ref 16–288)
Iron: 59 ug/dL (ref 45–160)
TIBC: 264 mcg/dL (calc) (ref 250–450)

## 2017-12-29 ENCOUNTER — Ambulatory Visit: Payer: Medicare Other | Admitting: Adult Health

## 2018-01-09 ENCOUNTER — Telehealth: Payer: Self-pay | Admitting: Cardiovascular Disease

## 2018-01-09 NOTE — Telephone Encounter (Signed)
Sara Garza reports intermittent nausea and dry heaving for about 2 weeks. She will start dry heaving then cough up clear phlegm.  She denies CP, palpitations, swelling. She has some SOB but it is no worse than usual.  Instructed the patient to call her PCP for assistance and recommendations. She understands that Cardiology is more than happy to help if PCP thinks issues are cardiac.

## 2018-01-09 NOTE — Telephone Encounter (Signed)
New Message:    Pt said she had a couple episodes and Nausea and Dry Heaves. She said last night it was terrible. She wonder if this might be heart related?

## 2018-01-13 NOTE — Telephone Encounter (Signed)
Spoke to patient and her symptoms have resolved

## 2018-01-21 NOTE — Progress Notes (Signed)
I have reviewed documentation for AWV and Advance Care Planning provided by the health coach and agree with documentation. I was immediately available for questions.  

## 2018-02-01 ENCOUNTER — Other Ambulatory Visit: Payer: Self-pay | Admitting: Cardiovascular Disease

## 2018-02-12 ENCOUNTER — Ambulatory Visit: Payer: Medicare Other | Admitting: Physician Assistant

## 2018-02-18 ENCOUNTER — Other Ambulatory Visit: Payer: Self-pay | Admitting: Adult Health

## 2018-02-18 DIAGNOSIS — R32 Unspecified urinary incontinence: Secondary | ICD-10-CM

## 2018-02-19 NOTE — Telephone Encounter (Signed)
Sent to the pharmacy by e-scribe. 

## 2018-02-25 ENCOUNTER — Ambulatory Visit (INDEPENDENT_AMBULATORY_CARE_PROVIDER_SITE_OTHER): Payer: Medicare Other | Admitting: Physician Assistant

## 2018-02-25 ENCOUNTER — Encounter: Payer: Self-pay | Admitting: Physician Assistant

## 2018-02-25 VITALS — BP 120/58 | HR 79 | Ht 59.0 in | Wt 159.1 lb

## 2018-02-25 DIAGNOSIS — I5032 Chronic diastolic (congestive) heart failure: Secondary | ICD-10-CM

## 2018-02-25 DIAGNOSIS — N183 Chronic kidney disease, stage 3 unspecified: Secondary | ICD-10-CM

## 2018-02-25 DIAGNOSIS — Z952 Presence of prosthetic heart valve: Secondary | ICD-10-CM | POA: Diagnosis not present

## 2018-02-25 DIAGNOSIS — I25119 Atherosclerotic heart disease of native coronary artery with unspecified angina pectoris: Secondary | ICD-10-CM | POA: Diagnosis not present

## 2018-02-25 DIAGNOSIS — I5033 Acute on chronic diastolic (congestive) heart failure: Secondary | ICD-10-CM | POA: Insufficient documentation

## 2018-02-25 DIAGNOSIS — I10 Essential (primary) hypertension: Secondary | ICD-10-CM

## 2018-02-25 DIAGNOSIS — R0602 Shortness of breath: Secondary | ICD-10-CM | POA: Diagnosis not present

## 2018-02-25 LAB — BASIC METABOLIC PANEL
BUN / CREAT RATIO: 26 (ref 12–28)
BUN: 37 mg/dL — ABNORMAL HIGH (ref 10–36)
CO2: 27 mmol/L (ref 20–29)
Calcium: 9.4 mg/dL (ref 8.7–10.3)
Chloride: 105 mmol/L (ref 96–106)
Creatinine, Ser: 1.4 mg/dL — ABNORMAL HIGH (ref 0.57–1.00)
GFR calc non Af Amer: 33 mL/min/{1.73_m2} — ABNORMAL LOW (ref >59)
GFR, EST AFRICAN AMERICAN: 38 mL/min/{1.73_m2} — AB (ref >59)
GLUCOSE: 95 mg/dL (ref 65–99)
Potassium: 4.6 mmol/L (ref 3.5–5.2)
SODIUM: 140 mmol/L (ref 134–144)

## 2018-02-25 LAB — PRO B NATRIURETIC PEPTIDE: NT-PRO BNP: 1649 pg/mL — AB (ref 0–738)

## 2018-02-25 MED ORDER — ISOSORBIDE MONONITRATE ER 60 MG PO TB24: 90.0000 mg | ORAL_TABLET | Freq: Every day | ORAL | 4 refills | Status: DC

## 2018-02-25 MED ORDER — ISOSORBIDE MONONITRATE ER 60 MG PO TB24: 90.0000 mg | ORAL_TABLET | Freq: Every day | ORAL | 2 refills | Status: DC

## 2018-02-25 NOTE — Patient Instructions (Signed)
Medication Instructions:   STAT TAKING IMDUR 90 MG ONCE A DAY   If you need a refill on your cardiac medications before your next appointment, please call your pharmacy.   Lab work:   BMET AD BNP TODAY   If you have labs (blood work) drawn today and your tests are completely normal, you will receive your results only by: [name] MyChart Message (if you have MyChart) OR . A paper copy in the mail If you have any lab test that is abnormal or we need to change your treatment, we will call you to review the results.  Testing/Procedures: ONE WEEK BEFORE 6 MONTH VISIT WITH COOPER Your physician has requested that you have an echocardiogram. Echocardiography is a painless test that uses sound waves to create images of your heart. It provides your doctor with information about the size and shape of your heart and how well your heart's chambers and valves are working. This procedure takes approximately one hour. There are no restrictions for this procedure.    Follow-Up: At [facility], you and your health needs are our priority.  As part of our continuing mission to provide you with exceptional heart care, we have created designated Provider Care Teams.  These Care Teams include your primary Cardiologist (physician) and Advanced Practice Providers (APPs -  Physician Assistants and Nurse Practitioners) who all work together to provide you with the care you need, when you need it. You will need a follow up appointment in:  6 months.  Please call our office 2 months in advance to schedule this appointment.  You may see Sherren Mocha, MD or one of the following Advanced Practice Providers on your designated Care Team: Richardson Dopp, PA-C [place], [place] . Daune Perch, NP  Any Other Special Instructions Will Be Listed Below (If Applicable).

## 2018-02-25 NOTE — Progress Notes (Signed)
Cardiology Office Note:    Date:  02/25/2018   ID:  Sara Garza, DOB 1923/05/11, MRN [id number]  PCP:  [name], NP  Cardiologist:  Sherren Mocha, MD  Electrophysiologist:  None   Referring MD: [name], NP   Chief Complaint  Patient presents with  . Follow-up    CHF, CAD w/ angina    History of Present Illness:    Sara Garza is a 84 y.o. female with valvular heart disease with aortic stenosis, mitral stenosis and mitral regurgitation as well as CAD, diastolic CHF, carotid artery disease, DM, HTN, HL, glaucoma. Sara Garza underwent [name] in 2014 (mod risk SurTAVI aortic stenosis trial). Sara Garza underwent pressure wire analysis of mod CAD in the LCx and RCA in 2013 that was neg. 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 Sara Garza has chronic angina which is managed medically.  Sara Garza was last seen by Dr. Burt Knack in 08/2017.  An echocardiogram prior to that visit demonstrated normal ejection fraction, mild diastolic dysfunction, mild AI and mod mitral stenosis.    Sara Garza returns for follow up.  Sara Garza is here alone.  Sara Garza notes that Sara Garza remains short of breath with minimal activity.  Sara Garza feels that it is gradually getting worse.  Sara Garza sleeps on 1 pillow. Sara Garza sometimes awakens short of breath but is able to quickly lay back down. Sara Garza does have a non-productive cough.  Sara Garza has chronic angina with a stable pattern.  Sara Garza does not feel that her chest discomfort is any worse.  Sara Garza denies syncope.  Her leg swelling seems to be stable.   Prior CV studies:   The following studies were reviewed today:  Echo 08/28/2017 Severe LVH, EF 65-70, normal wall motion, grade 1 diastolic dysfunction, status post [name] with mild regurgitation, moderate mitral stenosis (mean 10 mmHg), moderate LAE, PASP 33, trivial pericardial effusion  Echo 07/05/2016 Moderate LVH and severe concentric hypertrophy, EF 65-70, normal wall motion,  status post [name] with trivial to mild paravalvular leak (mean gradient 13 mmHg, peak 26 mmHg mild to moderate mitral stenosis (mean 8 mmHg), severe LAE, trivial TR trivial pericardial effusion  Echo 06/20/15 Mod LVH, severe basal septal hypertrophy, EF 60-65, no RWMA, Gr 1 DD, [name] with mod AI (mild central and mild perivalvular), mod to severe MAC, severe MS (mean 12), mod MR, mod LAE, normal RVSF, trivial eff  Echo 2/16 ([facility]) EF 60-65, focal mid ventricular aneurysm, severe asymmetric septal hypertrophy, severe MAC, mod to severe MS  LHC 8/13 LAD proximal 40-50, ostial D1 70 LCx mid 70 RCA proximal 60-70, mid 80 FFR of LCx and RCA neg (12/2011)  Echo 7/13 Mod LVH, EF 55-60, no RWMA, Gr 1 DD, severe AS, mild to mod MS, mild MR, mod LAE, PASP 40, small eff  Carotid US 2/13 ([facility]) R 1-39; L 40-59  Myoview 9/10 Normal    Past Medical History:  Diagnosis Date  . Arthritis   . CAD (coronary artery disease)   . Carotid stenosis, left    50-60% stable  . Diabetes mellitus   . Glaucoma   . Hyperlipidemia   . Hypertension   . Leg pain    ABIs 2/18: normal bilaterally.  . Osteoporosis    Surgical Hx: The patient  has a past surgical history that includes Cesarean section; Appendectomy; Foot surgery; Cardiac valve replacement (04/29/2012); Hip Arthroplasty (Right, 07/31/2013); percutaneous coronary stent intervention (pci-s) (N/A,  01/02/2012); Anomalous pulmonary venous return repair, total; and Cataracts (Bilateral).   Current Medications: Current Meds  Medication Sig  . amLODipine (NORVASC) 5 MG tablet TAKE 1 TABLET(5 MG) BY MOUTH DAILY  . amoxicillin (AMOXIL) 500 MG capsule TAKE 4 CAPSULES BY MOUTH 1 HOUR PRIOR TO DENTAL PROCEDURES  . aspirin EC 81 MG tablet Take 1 tablet (81 mg total) by mouth daily. Start after lovenox injections are done after 2 weeks  . bimatoprost (LUMIGAN) 0.01 % SOLN Place 1 drop into both eyes at bedtime.   . Calcium Carb-Cholecalciferol  (CALCIUM 500 +D) 500-400 MG-UNIT TABS Take 1 tablet by mouth daily.   . carvedilol (COREG) 3.125 MG tablet Take 3.125 mg by mouth 2 (two) times daily with a meal.  . ezetimibe (ZETIA) 10 MG tablet TAKE 1 TABLET(10 MG) BY MOUTH DAILY  . furosemide (LASIX) 20 MG tablet TAKE 1/2 TABLET(10 MG) BY MOUTH DAILY  . isosorbide mononitrate (IMDUR) 60 MG 24 hr tablet Take 1.5 tablets (90 mg total) by mouth daily.  . Multiple Vitamin (MULTIVITAMIN) tablet Take 1 tablet by mouth daily.    [name] MYRBETRIQ 25 MG TB24 tablet TAKE 1 TABLET BY MOUTH ONCE DAILY  . [DISCONTINUED] carvedilol (COREG) 3.125 MG tablet TAKE 1 TABLET(3.125 MG) BY MOUTH TWICE DAILY WITH A MEAL  . [DISCONTINUED] isosorbide mononitrate (IMDUR) 60 MG 24 hr tablet TAKE 1 TABLET(60 MG) BY MOUTH DAILY     Allergies:   Statins and Gabapentin   Social History   Tobacco Use  . Smoking status: Passive Smoke Exposure - Never Smoker  . Smokeless tobacco: Never Used  Substance Use Topics  . Alcohol use: Yes    Alcohol/week: 0.0 standard drinks    Comment: very seldom  . Drug use: No     Family Hx: The patient's family history includes COPD in her father; Cancer in her father; Lung cancer in her unknown relative.  ROS:   Please see the history of present illness.    Review of Systems  HENT: Positive for hearing loss.   Cardiovascular: Positive for chest pain, dyspnea on exertion and leg swelling.  Musculoskeletal: Positive for back pain and joint pain.  Neurological: Positive for loss of balance.   All other systems reviewed and are negative.   EKGs/Labs/Other Test Reviewed:    EKG:  EKG is  ordered today.  The ekg ordered today demonstrates NSR, HR 79, LBBB  Recent Labs: 08/27/2017: ALT 11 12/25/2017: BUN 30; Creatinine, Ser 1.20; Hemoglobin 11.5; Platelets 277.0; Potassium 4.6; Pro B Natriuretic peptide (BNP) 356.0; Sodium 141; TSH 1.62   Recent Lipid Panel Lab Results  Component Value Date/Time   CHOL 216 (H) 08/27/2017 11:15  AM   TRIG 182.0 (H) 08/27/2017 11:15 AM   HDL 45.80 08/27/2017 11:15 AM   CHOLHDL 5 08/27/2017 11:15 AM   LDLCALC 134 (H) 08/27/2017 11:15 AM   LDLDIRECT 158.6 10/23/2012 12:23 PM    Physical Exam:    VS:  BP (!) 120/58   Pulse 79   Ht _0  (1.499 m)   Wt 159 lb 1.9 oz (72.2 kg)   SpO2 97%   BMI 32.14 kg/m     Wt Readings from Last 3 Encounters:  02/25/18 159 lb 1.9 oz (72.2 kg)  12/25/17 162 lb (73.5 kg)  12/08/17 164 lb (74.4 kg)     Physical Exam  Constitutional: Sara Garza is oriented to person, place, and time. Sara Garza appears well-developed and well-nourished. No distress.  HENT:  Head: Normocephalic and atraumatic.  Eyes: No scleral icterus.  Neck: No JVD (no JVD at 90 degrees) present. No thyromegaly present.  Cardiovascular: Normal rate and regular rhythm.  Murmur heard.  Systolic murmur is present with a grade of 2/6 at the upper right sternal border. Pulmonary/Chest: Effort normal. Sara Garza has no wheezes. Sara Garza has no rales.  Abdominal: Soft.  Musculoskeletal: Sara Garza exhibits edema (trace - 1+ bilat LE edema (mostly non-pitting)).  Lymphadenopathy:    Sara Garza has no cervical adenopathy.  Neurological: Sara Garza is alert and oriented to person, place, and time.  Skin: Skin is warm and dry.  Psychiatric: Sara Garza has a normal mood and affect.    ASSESSMENT & PLAN:    Chronic diastolic CHF (congestive heart failure) ([place]) EF 65-70 by Echo in 5/19.  NYHA 3.  Sara Garza notes worsening shortness of breath.  Her exam does not suggest significant volume overload.  Sara Garza does not tolerate higher doses of diuretics very well.  Sara Garza notes a stable pattern of chest pain.  I have recommended that we adjust her Isosorbide and obtain a BNP today.  If this is elevated, I will have her increase her Lasix for a few days.  -Obtain BMET, BNP  -If BNP elevated, increase Lasix to 20 mg QD x 3 days, then resume 10 mg QD  -Sara Garza knows to return sooner if her symptoms only worsen.  Coronary artery disease involving native  coronary artery of native heart with angina pectoris (HCC) Moderate non-obstructive CAD by LHC in 2013 with neg FFR of the LCx and RCA.  Sara Garza has chronic angina.  As noted, Sara Garza has had some worsening shortness of breath.  I will adjust her Isosorbide further.  Sara Garza wants to try to manage her CAD conservatively if possible.  S/P [name] (transcatheter aortic valve replacement)  Mild AI and mod MS by last echo in 07/2017.  Her MV gradients were fairly stable compared to 2018.  Sara Garza has chronic shortness of breath that may be somewhat worse, as noted.  However, I do not think Sara Garza needs a sooner echo.  But, I will have her repeat her echo at 1 year (07/2018) prior to her next visit with Dr. Burt Knack.    CKD (chronic kidney disease), stage III ([place]) Obtain BMET today.  Essential hypertension  BP controlled.  Isosorbide will be increased to manage her angina.   Dispo:  Return in about 6 months (around 08/26/2018) for Routine Follow Up, w/ Dr. Burt Knack.   Medication Adjustments/Labs and Tests Ordered: Current medicines are reviewed at length with the patient today.  Concerns regarding medicines are outlined above.  Tests Ordered: Orders Placed This Encounter  Procedures  . Basic metabolic panel  . Pro b natriuretic peptide (BNP)  . EKG 12-Lead  . ECHOCARDIOGRAM COMPLETE   Medication Changes: Meds ordered this encounter  Medications  . DISCONTD: isosorbide mononitrate (IMDUR) 60 MG 24 hr tablet    Sig: Take 1.5 tablets (90 mg total) by mouth daily.    Dispense:  90 tablet    Refill:  2    **Patient requests 90 days supply**  . isosorbide mononitrate (IMDUR) 60 MG 24 hr tablet    Sig: Take 1.5 tablets (90 mg total) by mouth daily.    Dispense:  135 tablet    Refill:  4    **Patient requests 90 days supply**    Signed, Richardson Dopp, PA-C  02/25/2018 11:38 AM    [address], [place], [place]  [address] Phone: [phone];  Fax: [phone]

## 2018-03-02 ENCOUNTER — Telehealth: Payer: Self-pay

## 2018-03-02 ENCOUNTER — Telehealth: Payer: Self-pay | Admitting: Cardiovascular Disease

## 2018-03-02 DIAGNOSIS — E8770 Fluid overload, unspecified: Secondary | ICD-10-CM

## 2018-03-02 DIAGNOSIS — Z79899 Other long term (current) drug therapy: Secondary | ICD-10-CM

## 2018-03-02 MED ORDER — FUROSEMIDE 20 MG PO TABS: 20.0000 mg | ORAL_TABLET | Freq: Every day | ORAL | 3 refills | Status: DC

## 2018-03-02 NOTE — Telephone Encounter (Signed)
-----   Message from Liliane Shi, [place] sent at 02/25/2018  4:55 PM EST ----- BNP elevated.  Creatinine stable.  K+ normal. Increasing her Lasix will help with her breathing. Recommendations:  - Increase Lasix to 20 mg QD  - Repeat BMET 1 week Richardson Dopp, PA-C    02/25/2018 4:54 PM

## 2018-03-02 NOTE — Telephone Encounter (Signed)
See lab result notes

## 2018-03-02 NOTE — Telephone Encounter (Signed)
  Patient was returning call regarding lab results 

## 2018-03-02 NOTE — Telephone Encounter (Signed)
Notes recorded by Frederik Schmidt, RN on 03/02/2018 at 9:23 AM EST The patient has been notified of the result and verbalized understanding. Increased Lasix to 20 meq daily and she will come in 12/9 for BMET. All questions (if any) were answered. Frederik Schmidt, RN 03/02/2018 9:22 AM

## 2018-03-06 DIAGNOSIS — M5137 Other intervertebral disc degeneration, lumbosacral region: Secondary | ICD-10-CM | POA: Diagnosis not present

## 2018-03-09 ENCOUNTER — Other Ambulatory Visit: Payer: Medicare Other

## 2018-03-09 ENCOUNTER — Other Ambulatory Visit: Payer: Self-pay | Admitting: Cardiovascular Disease

## 2018-03-09 DIAGNOSIS — I359 Nonrheumatic aortic valve disorder, unspecified: Secondary | ICD-10-CM

## 2018-03-09 DIAGNOSIS — E8770 Fluid overload, unspecified: Secondary | ICD-10-CM

## 2018-03-09 DIAGNOSIS — Z79899 Other long term (current) drug therapy: Secondary | ICD-10-CM

## 2018-03-09 DIAGNOSIS — I25119 Atherosclerotic heart disease of native coronary artery with unspecified angina pectoris: Secondary | ICD-10-CM

## 2018-03-09 DIAGNOSIS — I10 Essential (primary) hypertension: Secondary | ICD-10-CM

## 2018-03-10 ENCOUNTER — Telehealth: Payer: Self-pay

## 2018-03-10 LAB — BASIC METABOLIC PANEL
BUN / CREAT RATIO: 25 (ref 12–28)
BUN: 36 mg/dL (ref 10–36)
CHLORIDE: 102 mmol/L (ref 96–106)
CO2: 22 mmol/L (ref 20–29)
CREATININE: 1.44 mg/dL — AB (ref 0.57–1.00)
Calcium: 8.9 mg/dL (ref 8.7–10.3)
GFR calc Af Amer: 36 mL/min/{1.73_m2} — ABNORMAL LOW (ref >59)
GFR calc non Af Amer: 32 mL/min/{1.73_m2} — ABNORMAL LOW (ref >59)
GLUCOSE: 123 mg/dL — AB (ref 65–99)
Potassium: 4.2 mmol/L (ref 3.5–5.2)
Sodium: 142 mmol/L (ref 134–144)

## 2018-03-10 MED ORDER — FUROSEMIDE 20 MG PO TABS: 10.0000 mg | ORAL_TABLET | Freq: Every day | ORAL | 3 refills | Status: DC

## 2018-03-10 NOTE — Telephone Encounter (Signed)
Ok to resume previous dose of Lasix. Weigh daily. Take extra Lasix for any weight gain of 3 lbs or more in 1 day or 5 lbs or more in 1 week. Richardson Dopp, PA-C    03/10/2018 4:32 PM

## 2018-03-10 NOTE — Telephone Encounter (Signed)
Reviewed recommendations per Richardson Dopp, PA-C who verbalized understanding. Will update med list in Epic. Pt was greatly appreciative of the call back.

## 2018-03-10 NOTE — Telephone Encounter (Signed)
Reviewed results with patient who verbalized understanding. Pt also states that her breathing has gotten better but will not continue taking Lasix any longer due to being in the bathroom too much.

## 2018-03-23 ENCOUNTER — Ambulatory Visit (INDEPENDENT_AMBULATORY_CARE_PROVIDER_SITE_OTHER): Payer: Medicare Other

## 2018-03-23 ENCOUNTER — Ambulatory Visit (INDEPENDENT_AMBULATORY_CARE_PROVIDER_SITE_OTHER): Payer: Medicare Other | Admitting: Family Medicine

## 2018-03-23 ENCOUNTER — Encounter: Payer: Self-pay | Admitting: Family Medicine

## 2018-03-23 VITALS — BP 130/84 | HR 82 | Temp 98.2°F | Resp 16 | Ht 59.0 in | Wt 157.1 lb

## 2018-03-23 DIAGNOSIS — J988 Other specified respiratory disorders: Secondary | ICD-10-CM | POA: Diagnosis not present

## 2018-03-23 DIAGNOSIS — I25119 Atherosclerotic heart disease of native coronary artery with unspecified angina pectoris: Secondary | ICD-10-CM | POA: Diagnosis not present

## 2018-03-23 DIAGNOSIS — R05 Cough: Secondary | ICD-10-CM

## 2018-03-23 DIAGNOSIS — J040 Acute laryngitis: Secondary | ICD-10-CM

## 2018-03-23 DIAGNOSIS — R0602 Shortness of breath: Secondary | ICD-10-CM

## 2018-03-23 DIAGNOSIS — R059 Cough, unspecified: Secondary | ICD-10-CM

## 2018-03-23 MED ORDER — DOXYCYCLINE HYCLATE 100 MG PO TABS: 100.0000 mg | ORAL_TABLET | Freq: Two times a day (BID) | ORAL | 0 refills | Status: AC

## 2018-03-23 MED ORDER — BENZONATATE 100 MG PO CAPS: 200.0000 mg | ORAL_CAPSULE | Freq: Two times a day (BID) | ORAL | 0 refills | Status: AC | PRN

## 2018-03-23 NOTE — Patient Instructions (Addendum)
A few things to remember from today's visit:   Respiratory tract infection - Plan: doxycycline (VIBRA-TABS) 100 MG tablet  Laryngitis, acute  Cough - Plan: benzonatate (TESSALON) 100 MG capsule, DG Chest 2 View  Shortness of breath - Plan: DG Chest 2 View  viral infections are self-limited and we treat each symptom depending of severity.  Over the counter medications as decongestants and cold medications usually help, they need to be taken with caution if there is a history of high blood pressure or palpitations. So I do not recommend it.  Tylenol also helps with most symptoms (headache, muscle aching, fever,etc) Plenty of fluids. Honey helps with cough. Steam inhalations helps with runny nose, nasal congestion, and may prevent sinus infections. Cough and nasal congestion could last a few days and sometimes weeks. Please follow in not any better in 1-2 weeks or if symptoms get worse.   Today I sent a prescription for antibiotics to your pharmacy, recommend holding on filling prescription and started if you develop fever, symptoms get worse, or you do not feel any better in 3 to 4 days. For hoarseness, recommend voice rest.   Please be sure medication list is accurate. If a new problem present, please set up appointment sooner than planned today.

## 2018-03-23 NOTE — Progress Notes (Signed)
ACUTE VISIT  HPI:  Chief Complaint  Patient presents with  . Cough    with green/yellow mixed with blood mucus x 5 days  . Laryngitis    Sara Garza is a 84 y.o.female with history of DM 2, CAD, and CKD III is here today complaining of 5 days of respiratory symptoms, feeling "sick."  Productive cough with yellowish/greenish sputum "mixed with blood." She has not noted fever, states that she "never" gets fever. She has had some chills, rhinorrhea, and postnasal drainage. She has not noted nasal congestion. A couple days ago she started with hoarseness, it seems to be worse at the end of the day.  When asked about dyspnea, states that she has "always" shortness of breath, reported as unchanged. She denies wheezing or chest pain.  She is also complaining about nausea, she denies abdominal pain, vomiting, or diarrhea.  Cough  This is a new problem. The current episode started in the past 7 days. The problem has been gradually worsening. The cough is productive of blood-tinged sputum. Associated symptoms include chills, myalgias, postnasal drip, rhinorrhea and shortness of breath (chronic). Pertinent negatives include no chest pain, ear pain, eye redness, fever, headaches, rash, sore throat or wheezing. The symptoms are aggravated by lying down. She has tried OTC cough suppressant for the symptoms. The treatment provided mild relief. There is no history of environmental allergies.    She feels like she is getting worse.  No Hx of recent travel. No sick contact. No known insect bite.  Hx of allergies: Negative.  OTC medications for this problem: Mucinex and Delsym.  Review of Systems  Constitutional: Positive for activity change, chills and fatigue. Negative for appetite change and fever.  HENT: Positive for postnasal drip, rhinorrhea and voice change. Negative for congestion, ear pain, mouth sores, sinus pressure, sore throat and trouble swallowing.   Eyes:  Negative for discharge and redness.  Respiratory: Positive for cough and shortness of breath (chronic). Negative for wheezing.   Cardiovascular: Negative for chest pain.  Gastrointestinal: Positive for nausea. Negative for abdominal pain, diarrhea and vomiting.  Musculoskeletal: Positive for gait problem (chronic) and myalgias.  Skin: Negative for rash.  Allergic/Immunologic: Negative for environmental allergies.  Neurological: Negative for syncope, weakness and headaches.      Current Outpatient Medications on File Prior to Visit  Medication Sig Dispense Refill  . amLODipine (NORVASC) 5 MG tablet TAKE 1 TABLET(5 MG) BY MOUTH DAILY 90 tablet 2  . amoxicillin (AMOXIL) 500 MG capsule TAKE 4 CAPSULES BY MOUTH 1 HOUR PRIOR TO DENTAL PROCEDURES 8 capsule 6  . aspirin EC 81 MG tablet Take 1 tablet (81 mg total) by mouth daily. Start after lovenox injections are done after 2 weeks    . bimatoprost (LUMIGAN) 0.01 % SOLN Place 1 drop into both eyes at bedtime.     . Calcium Carb-Cholecalciferol (CALCIUM 500 +D) 500-400 MG-UNIT TABS Take 1 tablet by mouth daily.     . carvedilol (COREG) 3.125 MG tablet Take 3.125 mg by mouth 2 (two) times daily with a meal.    . ezetimibe (ZETIA) 10 MG tablet TAKE 1 TABLET(10 MG) BY MOUTH DAILY 30 tablet 11  . furosemide (LASIX) 20 MG tablet Take 0.5 tablets (10 mg total) by mouth daily. 90 tablet 3  . isosorbide mononitrate (IMDUR) 60 MG 24 hr tablet Take 1.5 tablets (90 mg total) by mouth daily. 135 tablet 4  . Multiple Vitamin (MULTIVITAMIN) tablet Take 1 tablet  by mouth daily.      [name] MYRBETRIQ 25 MG TB24 tablet TAKE 1 TABLET BY MOUTH ONCE DAILY 90 tablet 1   No current facility-administered medications on file prior to visit.      Past Medical History:  Diagnosis Date  . Arthritis   . CAD (coronary artery disease)   . Carotid stenosis, left    50-60% stable  . Diabetes mellitus   . Glaucoma   . Hyperlipidemia   . Hypertension   . Leg pain    ABIs  2/18: normal bilaterally.  . Osteoporosis    Allergies  Allergen Reactions  . Statins Other (See Comments)    Muscle aches  . Gabapentin Other (See Comments)    SOMNOLENCE    Social History   Socioeconomic History  . Marital status: Widowed    Spouse name: Not on file  . Number of children: Not on file  . Years of education: Not on file  . Highest education level: Not on file  Occupational History  . Not on file  Social Needs  . Financial resource strain: Not on file  . Food insecurity:    Worry: Not on file    Inability: Not on file  . Transportation needs:    Medical: Not on file    Non-medical: Not on file  Tobacco Use  . Smoking status: Passive Smoke Exposure - Never Smoker  . Smokeless tobacco: Never Used  Substance and Sexual Activity  . Alcohol use: Yes    Alcohol/week: 0.0 standard drinks    Comment: very seldom  . Drug use: No  . Sexual activity: Not Currently  Lifestyle  . Physical activity:    Days per week: Not on file    Minutes per session: Not on file  . Stress: Not on file  Relationships  . Social connections:    Talks on phone: Not on file    Gets together: Not on file    Attends religious service: Not on file    Active member of club or organization: Not on file    Attends meetings of clubs or organizations: Not on file    Relationship status: Not on file  Other Topics Concern  . Not on file  Social History Narrative   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      She likes to read and she takes classes at [employer]:   03/23/18 1049  BP: 130/84  Pulse: 82  Resp: 16  Temp: 98.2 F (36.8 C)  SpO2: 95%   Body mass index is 31.74 kg/m.   Physical Exam  Nursing note and vitals reviewed. Constitutional: She is oriented to person, place, and time. She appears well-developed. She does not appear ill. No distress.  HENT:  Head: Normocephalic and atraumatic.  Right Ear: Tympanic  membrane, external ear and ear canal normal.  Left Ear: Tympanic membrane, external ear and ear canal normal.  Nose: Rhinorrhea present. Right sinus exhibits no maxillary sinus tenderness and no frontal sinus tenderness. Left sinus exhibits no maxillary sinus tenderness and no frontal sinus tenderness.  Mouth/Throat: Oropharynx is clear and moist and mucous membranes are normal.  Dysphonia, moderate.  Eyes: Conjunctivae are normal.  Neck: No muscular tenderness present. No edema and no erythema present.  Cardiovascular: Normal rate and regular rhythm.  Murmur (SEM II-III/VI RUSB) heard. Respiratory: Effort normal and breath sounds normal. No stridor. No  respiratory distress.  Lymphadenopathy:       Head (right side): No submandibular adenopathy present.       Head (left side): No submandibular adenopathy present.    She has no cervical adenopathy.  Neurological: She is alert and oriented to person, place, and time. She has normal strength.  Skin: Skin is warm. No rash noted. No erythema.  Psychiatric: She has a normal mood and affect.  Well groomed, good eye contact.     ASSESSMENT AND PLAN:  [name] was seen today for cough and laryngitis.  Diagnoses and all orders for this visit:  Respiratory tract infection Explained that it is most likely viral, so symptomatic treatment recommended for now. I sent Rx for Doxycycline, recommend starting abx in she is not any better in 3-4 days.  We discussed some side effects of antibiotic. Instructed about warning signs.  -     doxycycline (VIBRA-TABS) 100 MG tablet; Take 1 tablet (100 mg total) by mouth 2 (two) times daily for 7 days.  Laryngitis, acute Voice rest. Instructed about warning signs.  Cough Adequate hydration. Benzonatate recommended. Explained that cough and congestion can last a few days and even weeks.  -     benzonatate (TESSALON) 100 MG capsule; Take 2 capsules (200 mg total) by mouth 2 (two) times daily as needed  for up to 10 days. -     DG Chest 2 View; Future  Shortness of breath Reporting problem as chronic and stable. Auscultation today negative for rales. Instructed about warning signs.  -     DG Chest 2 View; Future        [name]. [name], MD  [facility]. [place] office.

## 2018-03-30 ENCOUNTER — Telehealth: Payer: Self-pay | Admitting: Adult Health

## 2018-03-30 NOTE — Telephone Encounter (Signed)
Copied from [place] [id number]. Topic: Quick Communication - Lab Results (Clinic Use ONLY) >> Mar 30, 2018 10:47 AM Zacarias Pontes, CMA wrote: Called patient to inform them of their lab results. When patient returns call, triage nurse may disclose results. >> Mar 30, 2018  1:20 PM Leward Quan A wrote: Patient called back for Xray results. Ph# [phone]

## 2018-03-30 NOTE — Telephone Encounter (Signed)
Results given and documented in result

## 2018-04-03 ENCOUNTER — Encounter: Payer: Self-pay | Admitting: Adult Health

## 2018-04-03 ENCOUNTER — Ambulatory Visit (INDEPENDENT_AMBULATORY_CARE_PROVIDER_SITE_OTHER): Payer: Medicare Other | Admitting: Adult Health

## 2018-04-03 VITALS — BP 146/60 | HR 74 | Temp 97.8°F | Wt 158.0 lb

## 2018-04-03 DIAGNOSIS — R059 Cough, unspecified: Secondary | ICD-10-CM

## 2018-04-03 DIAGNOSIS — R05 Cough: Secondary | ICD-10-CM

## 2018-04-03 NOTE — Progress Notes (Signed)
Subjective:    Patient ID: Sara Garza, female    DOB: 06-Nov-1923, 85 y.o.   MRN: [id number]  HPI  85 year old female who  has a past medical history of Arthritis, CAD (coronary artery disease), Carotid stenosis, left, Diabetes mellitus, Glaucoma, Hyperlipidemia, Hypertension, Leg pain, and Osteoporosis.  She presents to the office today for follow up after being seen on 03/23/2018 by another provider in the office for URI. At the time it was felt that symptoms were more likely viral but she was sent in a prescription for Doxycycline to start if she was not improving in 3-4 days. She was also prescribed tessalon pearls for cough.   Chest xray showed   1. Increased interstitial opacity at the bases suggesting interstitial inflammatory process. No focal pulmonary infiltrate is seen. 2. Cardiomegaly  Today in the office she reports she had to start the doxycycline and finished the course of antibiotics. She feels significantly improved but continues to have a slight cough. She reports that the tessalon pearls did not do much to help with her cough   She denies feeling acutely ill, shortness of breath, fevers, or chest pain   Review of Systems See HPI   Past Medical History:  Diagnosis Date  . Arthritis   . CAD (coronary artery disease)   . Carotid stenosis, left    50-60% stable  . Diabetes mellitus   . Glaucoma   . Hyperlipidemia   . Hypertension   . Leg pain    ABIs 2/18: normal bilaterally.  . Osteoporosis     Social History   Socioeconomic History  . Marital status: Widowed    Spouse name: Not on file  . Number of children: Not on file  . Years of education: Not on file  . Highest education level: Not on file  Occupational History  . Not on file  Social Needs  . Financial resource strain: Not on file  . Food insecurity:    Worry: Not on file    Inability: Not on file  . Transportation needs:    Medical: Not on file    Non-medical: Not on file  Tobacco Use   . Smoking status: Passive Smoke Exposure - Never Smoker  . Smokeless tobacco: Never Used  Substance and Sexual Activity  . Alcohol use: Yes    Alcohol/week: 0.0 standard drinks    Comment: very seldom  . Drug use: No  . Sexual activity: Not Currently  Lifestyle  . Physical activity:    Days per week: Not on file    Minutes per session: Not on file  . Stress: Not on file  Relationships  . Social connections:    Talks on phone: Not on file    Gets together: Not on file    Attends religious service: Not on file    Active member of club or organization: Not on file    Attends meetings of clubs or organizations: Not on file    Relationship status: Not on file  . Intimate partner violence:    Fear of current or ex partner: Not on file    Emotionally abused: Not on file    Physically abused: Not on file    Forced sexual activity: Not on file  Other Topics Concern  . Not on file  Social History Narrative   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      She likes  to read and she takes classes at [employer]    Past Surgical History:  Procedure Laterality Date  . ANOMALOUS PULMONARY VENOUS RETURN REPAIR, TOTAL    . APPENDECTOMY    . CARDIAC VALVE REPLACEMENT  04/29/2012  . Cataracts Bilateral   . CESAREAN SECTION    . FOOT SURGERY     [name]  . HIP ARTHROPLASTY Right 07/31/2013   Procedure: ARTHROPLASTY BIPOLAR HIP;  Surgeon: [name], MD;  Location: [facility];  Service: Orthopedics;  Laterality: Right;  . PERCUTANEOUS CORONARY STENT INTERVENTION (PCI-S) N/A 01/02/2012   Procedure: PERCUTANEOUS CORONARY STENT INTERVENTION (PCI-S);  Surgeon: Sherren Mocha, MD;  Location: [facility] CATH [facility];  Service: Cardiovascular;  Laterality: N/A;    Family History  Problem Relation Age of Onset  . COPD Father   . Cancer Father        lung cancer  . Lung cancer Unknown     Allergies  Allergen Reactions  . Statins Other (See Comments)    Muscle aches    . Gabapentin Other (See Comments)    SOMNOLENCE    Current Outpatient Medications on File Prior to Visit  Medication Sig Dispense Refill  . amLODipine (NORVASC) 5 MG tablet TAKE 1 TABLET(5 MG) BY MOUTH DAILY 90 tablet 2  . aspirin EC 81 MG tablet Take 1 tablet (81 mg total) by mouth daily. Start after lovenox injections are done after 2 weeks    . bimatoprost (LUMIGAN) 0.01 % SOLN Place 1 drop into both eyes at bedtime.     . Calcium Carb-Cholecalciferol (CALCIUM 500 +D) 500-400 MG-UNIT TABS Take 1 tablet by mouth daily.     . carvedilol (COREG) 3.125 MG tablet Take 3.125 mg by mouth 2 (two) times daily with a meal.    . ezetimibe (ZETIA) 10 MG tablet TAKE 1 TABLET(10 MG) BY MOUTH DAILY 30 tablet 11  . furosemide (LASIX) 20 MG tablet Take 0.5 tablets (10 mg total) by mouth daily. 90 tablet 3  . isosorbide mononitrate (IMDUR) 60 MG 24 hr tablet Take 1.5 tablets (90 mg total) by mouth daily. 135 tablet 4  . Multiple Vitamin (MULTIVITAMIN) tablet Take 1 tablet by mouth daily.      [name] MYRBETRIQ 25 MG TB24 tablet TAKE 1 TABLET BY MOUTH ONCE DAILY 90 tablet 1   No current facility-administered medications on file prior to visit.     BP (!) 146/60   Pulse 74   Temp 97.8 F (36.6 C)   Wt 158 lb (71.7 kg)   SpO2 97%   BMI 31.91 kg/m       Objective:   Physical Exam Vitals signs and nursing note reviewed.  Constitutional:      Appearance: Normal appearance.  Cardiovascular:     Rate and Rhythm: Normal rate and regular rhythm.     Pulses: Normal pulses.     Heart sounds: Normal heart sounds.  Pulmonary:     Effort: Pulmonary effort is normal. No respiratory distress.     Breath sounds: Normal breath sounds. No stridor. No wheezing, rhonchi or rales.  Chest:     Chest wall: No tenderness.  Musculoskeletal:     Comments: Walks with cane   Skin:    General: Skin is warm and dry.     Capillary Refill: Capillary refill takes less than 2 seconds.  Neurological:     General: No  focal deficit present.     Mental Status: She is alert. Mental status is at baseline.  Psychiatric:        Mood and Affect: Mood normal.        Behavior: Behavior normal.        Thought Content: Thought content normal.        Judgment: Judgment normal.       Assessment & Plan:  1. Cough - No concern for infection  - She can use Mucinex or Delsym to help with cough  - Follow up as needed  [name], NP

## 2018-04-22 DIAGNOSIS — R3915 Urgency of urination: Secondary | ICD-10-CM | POA: Diagnosis not present

## 2018-04-22 DIAGNOSIS — N3946 Mixed incontinence: Secondary | ICD-10-CM | POA: Diagnosis not present

## 2018-05-01 DIAGNOSIS — H353132 Nonexudative age-related macular degeneration, bilateral, intermediate dry stage: Secondary | ICD-10-CM | POA: Diagnosis not present

## 2018-05-01 DIAGNOSIS — H52201 Unspecified astigmatism, right eye: Secondary | ICD-10-CM | POA: Diagnosis not present

## 2018-05-01 DIAGNOSIS — H401132 Primary open-angle glaucoma, bilateral, moderate stage: Secondary | ICD-10-CM | POA: Diagnosis not present

## 2018-05-01 DIAGNOSIS — E119 Type 2 diabetes mellitus without complications: Secondary | ICD-10-CM | POA: Diagnosis not present

## 2018-05-01 LAB — HM DIABETES EYE EXAM

## 2018-05-12 DIAGNOSIS — H6123 Impacted cerumen, bilateral: Secondary | ICD-10-CM | POA: Diagnosis not present

## 2018-05-12 DIAGNOSIS — H903 Sensorineural hearing loss, bilateral: Secondary | ICD-10-CM | POA: Diagnosis not present

## 2018-05-14 ENCOUNTER — Encounter: Payer: Self-pay | Admitting: Family Medicine

## 2018-05-19 DIAGNOSIS — R2689 Other abnormalities of gait and mobility: Secondary | ICD-10-CM | POA: Diagnosis not present

## 2018-05-19 DIAGNOSIS — M545 Low back pain: Secondary | ICD-10-CM | POA: Diagnosis not present

## 2018-05-25 DIAGNOSIS — M5416 Radiculopathy, lumbar region: Secondary | ICD-10-CM | POA: Diagnosis not present

## 2018-05-25 DIAGNOSIS — R2689 Other abnormalities of gait and mobility: Secondary | ICD-10-CM | POA: Diagnosis not present

## 2018-05-29 DIAGNOSIS — M5416 Radiculopathy, lumbar region: Secondary | ICD-10-CM | POA: Diagnosis not present

## 2018-06-01 DIAGNOSIS — R2689 Other abnormalities of gait and mobility: Secondary | ICD-10-CM | POA: Diagnosis not present

## 2018-06-02 DIAGNOSIS — H353132 Nonexudative age-related macular degeneration, bilateral, intermediate dry stage: Secondary | ICD-10-CM | POA: Diagnosis not present

## 2018-06-02 DIAGNOSIS — H43813 Vitreous degeneration, bilateral: Secondary | ICD-10-CM | POA: Diagnosis not present

## 2018-06-04 DIAGNOSIS — R2689 Other abnormalities of gait and mobility: Secondary | ICD-10-CM | POA: Diagnosis not present

## 2018-06-04 DIAGNOSIS — M5416 Radiculopathy, lumbar region: Secondary | ICD-10-CM | POA: Diagnosis not present

## 2018-06-11 DIAGNOSIS — R2689 Other abnormalities of gait and mobility: Secondary | ICD-10-CM | POA: Diagnosis not present

## 2018-06-11 DIAGNOSIS — M5416 Radiculopathy, lumbar region: Secondary | ICD-10-CM | POA: Diagnosis not present

## 2018-07-12 ENCOUNTER — Other Ambulatory Visit: Payer: Self-pay | Admitting: Adult Health

## 2018-07-13 NOTE — Telephone Encounter (Signed)
Sent to the pharmacy by e-scribe. 

## 2018-08-05 ENCOUNTER — Ambulatory Visit (HOSPITAL_COMMUNITY): Payer: Medicare Other

## 2018-09-14 ENCOUNTER — Other Ambulatory Visit: Payer: Self-pay | Admitting: Adult Health

## 2018-09-15 NOTE — Telephone Encounter (Signed)
Pt is still taking.  Sent to the pharmacy by e-scribe for 90 days.  Pt has upcoming cpx.

## 2018-09-15 NOTE — Telephone Encounter (Signed)
Is she still taking this medication? It was last filled a year ago for 90 days

## 2018-09-22 ENCOUNTER — Ambulatory Visit: Payer: Self-pay

## 2018-09-22 NOTE — Telephone Encounter (Signed)
Pt scheduled with PCP

## 2018-09-22 NOTE — Telephone Encounter (Signed)
Patient called and says that her BP has been running high and her daughter told her to call back to let us know about the BP and to ask if being placed on an ACE or ARB would help her BP. I advised she will need a virtual visit first to discuss her BP. She says on Sunday it was 164/82, Monday 157/81 and today it was 187/88 before her medication and down to 141/79 after taking medication. She denies any symptoms. I called the office and spoke to Tammy, FC who asked to speak to the patient, the call was connected successfully.  Answer Assessment - Initial Assessment Questions 1. BLOOD PRESSURE: "What is the blood pressure?" "Did you take at least two measurements 5 minutes apart?"     18 7/88, then 141/79 2. ONSET: "When did you take your blood pressure?"     Today 3. HOW: "How did you obtain the blood pressure?" (e.g., visiting nurse, automatic home BP monitor)     Automatic home BP monitor 4. HISTORY: "Do you have a history of high blood pressure?"    Yes 5. MEDICATIONS: "Are you taking any medications for blood pressure?" "Have you missed any doses recently?"     No 6. OTHER SYMPTOMS: "Do you have any symptoms?" (e.g., headache, chest pain, blurred vision, difficulty breathing, weakness)     Headaches off and on; always difficulty breathing 7. PREGNANCY: "Is there any chance you are pregnant?" "When was your last menstrual period?"     No  Protocols used: HIGH BLOOD PRESSURE-A-AH

## 2018-09-23 ENCOUNTER — Other Ambulatory Visit: Payer: Self-pay

## 2018-09-23 ENCOUNTER — Ambulatory Visit (INDEPENDENT_AMBULATORY_CARE_PROVIDER_SITE_OTHER): Payer: Medicare Other | Admitting: Adult Health

## 2018-09-23 ENCOUNTER — Encounter: Payer: Self-pay | Admitting: Adult Health

## 2018-09-23 DIAGNOSIS — I10 Essential (primary) hypertension: Secondary | ICD-10-CM

## 2018-09-23 MED ORDER — LOSARTAN POTASSIUM 25 MG PO TABS: 25.0000 mg | ORAL_TABLET | Freq: Every day | ORAL | 0 refills | Status: DC

## 2018-09-23 NOTE — Progress Notes (Signed)
Virtual Visit via Telephone Note  I connected with Juab on 09/23/18 at  2:30 PM EDT by telephone and verified that I am speaking with the correct person using two identifiers.   I discussed the limitations, risks, security and privacy concerns of performing an evaluation and management service by telephone and the availability of in person appointments. I also discussed with the patient that there may be a patient responsible charge related to this service. The patient expressed understanding and agreed to proceed.  Location patient: home Location provider: work or home office Participants present for the call: patient, provider Patient did not have a visit in the prior 7 days to address this/these issue(s).   History of Present Illness: 85 year old female who  has a past medical history of Arthritis, CAD (coronary artery disease), Carotid stenosis, left, Diabetes mellitus, Glaucoma, Hyperlipidemia, Hypertension, Leg pain, and Osteoporosis.  She is being evaluated today for concern of elevated blood pressure readings.  Been monitoring her blood pressure throughout the week and reports readings between 157-187 over 80s.  She is currently prescribed Norvasc 5 mg, Lasix 10 mg daily, as well as Coreg 3.125 mg twice daily.  Denies headaches or blurred vision.  She has chronic shortness of breath from heart failure.  She has not experienced any chest pain.   Observations/Objective: Patient sounds cheerful and well on the phone. I do not appreciate any SOB. Speech and thought processing are grossly intact. Patient reported vitals:  Assessment and Plan: 1. Essential hypertension -We will start on low-dose of losartan 25 mg.  Follow-up in 1 week or sooner if needed - losartan (COZAAR) 25 MG tablet; Take 1 tablet (25 mg total) by mouth daily.  Dispense: 90 tablet; Refill: 0   Follow Up Instructions:   I did not refer this patient for an OV in the next 24 hours for this/these  issue(s).  I discussed the assessment and treatment plan with the patient. The patient was provided an opportunity to ask questions and all were answered. The patient agreed with the plan and demonstrated an understanding of the instructions.   The patient was advised to call back or seek an in-person evaluation if the symptoms worsen or if the condition fails to improve as anticipated.  I provided 25 minutes of non-face-to-face time during this encounter.   [name], NP

## 2018-09-24 ENCOUNTER — Encounter: Payer: Medicare Other | Admitting: Adult Health

## 2018-09-29 ENCOUNTER — Encounter

## 2018-09-30 ENCOUNTER — Telehealth: Payer: Self-pay | Admitting: Adult Health

## 2018-09-30 NOTE — Telephone Encounter (Signed)
Spoke to patient regarding 1 week follow-up after starting losartan 25 mg for elevated blood pressure readings.  She reports that when she took a full tab her blood pressure dropped into the 974 systolic range.  She then cut the pill in half and her blood pressure was more manageable in the 140s over 80s.   Advised to continue splitting pill in half to keep blood pressure in the 1 40-1 50 range.  Return precautions reviewed

## 2018-10-05 ENCOUNTER — Other Ambulatory Visit: Payer: Self-pay

## 2018-10-05 ENCOUNTER — Ambulatory Visit (HOSPITAL_COMMUNITY): Payer: Medicare Other | Attending: Internal Medicine

## 2018-10-05 DIAGNOSIS — Z952 Presence of prosthetic heart valve: Secondary | ICD-10-CM | POA: Diagnosis not present

## 2018-10-06 ENCOUNTER — Encounter: Payer: Self-pay | Admitting: Physician Assistant

## 2018-10-07 ENCOUNTER — Telehealth: Payer: Self-pay | Admitting: *Deleted

## 2018-10-07 DIAGNOSIS — I25119 Atherosclerotic heart disease of native coronary artery with unspecified angina pectoris: Secondary | ICD-10-CM

## 2018-10-07 DIAGNOSIS — I10 Essential (primary) hypertension: Secondary | ICD-10-CM

## 2018-10-07 MED ORDER — ISOSORBIDE MONONITRATE ER 60 MG PO TB24: 60.0000 mg | ORAL_TABLET | Freq: Every day | ORAL | 4 refills | Status: DC

## 2018-10-07 NOTE — Telephone Encounter (Signed)
Spoke with patient and verbalized understanding of recommendations. Patient stated she is not taking Imdur 90 mg She is taking Imdur 60 mg due to she was told its is not recommended to take cut and extended dose of medicine. Patient stated she feeling fine and wanted weaver to know that.

## 2018-10-09 ENCOUNTER — Other Ambulatory Visit: Payer: Self-pay | Admitting: Adult Health

## 2018-10-09 NOTE — Telephone Encounter (Signed)
Sent to the pharmacy by e-scribe. 

## 2018-10-09 NOTE — Telephone Encounter (Signed)
Spoke with patient about  available appointment time and patient was agreeable to time July 22 at 3:45

## 2018-10-09 NOTE — Telephone Encounter (Signed)
Ok to refill for 90 +1. I followed up with her about her BP on 7/1

## 2018-10-15 DIAGNOSIS — R3915 Urgency of urination: Secondary | ICD-10-CM | POA: Diagnosis not present

## 2018-10-15 DIAGNOSIS — N3946 Mixed incontinence: Secondary | ICD-10-CM | POA: Diagnosis not present

## 2018-10-21 ENCOUNTER — Ambulatory Visit (INDEPENDENT_AMBULATORY_CARE_PROVIDER_SITE_OTHER): Payer: Medicare Other | Admitting: Cardiovascular Disease

## 2018-10-21 ENCOUNTER — Other Ambulatory Visit: Payer: Self-pay

## 2018-10-21 ENCOUNTER — Encounter: Payer: Self-pay | Admitting: Cardiovascular Disease

## 2018-10-21 VITALS — BP 136/60 | HR 74 | Ht 59.0 in | Wt 161.0 lb

## 2018-10-21 DIAGNOSIS — I25119 Atherosclerotic heart disease of native coronary artery with unspecified angina pectoris: Secondary | ICD-10-CM

## 2018-10-21 DIAGNOSIS — Z952 Presence of prosthetic heart valve: Secondary | ICD-10-CM | POA: Diagnosis not present

## 2018-10-21 DIAGNOSIS — I5032 Chronic diastolic (congestive) heart failure: Secondary | ICD-10-CM | POA: Diagnosis not present

## 2018-10-21 DIAGNOSIS — I10 Essential (primary) hypertension: Secondary | ICD-10-CM

## 2018-10-21 NOTE — Progress Notes (Signed)
Cardiology Office Note:    Date:  10/21/2018   ID:  Sara Garza, DOB 1923-10-03, MRN [id number]  PCP:  [name], NP  Cardiologist:  Sherren Mocha, MD  Electrophysiologist:  None   Referring MD: [name], NP   Chief Complaint  Patient presents with  . Shortness of Breath    History of Present Illness:    Sara Garza is a 85 y.o. female with a hx of CAD, aortic stenosis, and chronic diastolic heart failure, presenting for follow-up evaluation.  She underwent for [name] for treatment of severe symptomatic aortic stenosis in 2014. She was treated with a 23 mm Medtronic Corevalve through the moderate risk SurTAVI aortic stenosis trial. She also is followed for moderate coronary artery disease which was evaluated with pressure wire analysis of the RCA and LCx demonstrating no hemodynamic evidence of flow-obstruction. 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  The patient is here with her daughter today.  She continues to struggle with shortness of breath with activity.  She has mild leg swelling is unchanged over time.  She denies orthopnea or PND.  She has a lot of difficulty with urinary incontinence and has had trouble tolerating more than 10 mg of furosemide daily.  She has no chest pain or pressure.  She has no heart palpitations, lightheadedness, or syncope.  A recent echocardiogram was performed and showed normal function of her [name] bioprosthesis, vigorous LV function, normal right heart function, and moderately severe mitral valve stenosis with heavy mitral annular calcification.  Past Medical History:  Diagnosis Date  . Arthritis   . CAD (coronary artery disease)   . Carotid stenosis, left    50-60% stable  . Diabetes mellitus   . Glaucoma   . Hyperlipidemia   . Hypertension   . Leg pain    ABIs 2/18: normal bilaterally.  . Osteoporosis   . Valvular heart  disease    Aortic Stenosis s/p [name] 2014 // Mitral stenosis // Echo 09/2018: EF > 65, mod LVH, severe focal basal hypertrophy, RVSP 39.8, mild to mod MR, mod to severe MS (mean 9), AVR with mild AI, severe LAE (similar to prior echo)    Past Surgical History:  Procedure Laterality Date  . ANOMALOUS PULMONARY VENOUS RETURN REPAIR, TOTAL    . APPENDECTOMY    . CARDIAC VALVE REPLACEMENT  04/29/2012  . Cataracts Bilateral   . CESAREAN SECTION    . FOOT SURGERY     [name]  . HIP ARTHROPLASTY Right 07/31/2013   Procedure: ARTHROPLASTY BIPOLAR HIP;  Surgeon: [name], MD;  Location: [facility];  Service: Orthopedics;  Laterality: Right;  . PERCUTANEOUS CORONARY STENT INTERVENTION (PCI-S) N/A 01/02/2012   Procedure: PERCUTANEOUS CORONARY STENT INTERVENTION (PCI-S);  Surgeon: Sherren Mocha, MD;  Location: [facility] CATH LAB;  Service: Cardiovascular;  Laterality: N/A;    Current Medications: Current Meds  Medication Sig  . amLODipine (NORVASC) 5 MG tablet TAKE 1 TABLET(5 MG) BY MOUTH DAILY  . amoxicillin (AMOXIL) 500 MG capsule TK 4 CAPSULES PO 1 HOUR PRIOR TO DENTAL PROCEDURES  . aspirin EC 81 MG tablet Take 1 tablet (81 mg total) by mouth daily. Start after lovenox injections are done after 2 weeks  . bimatoprost (LUMIGAN) 0.01 % SOLN Place 1 drop into both eyes at bedtime.   . Calcium Carb-Cholecalciferol (CALCIUM 500 +D) 500-400 MG-UNIT TABS Take 1 tablet by  mouth daily.   . carvedilol (COREG) 3.125 MG tablet TAKE 1 TABLET(3.125 MG) BY MOUTH TWICE DAILY WITH A MEAL  . ezetimibe (ZETIA) 10 MG tablet TAKE 1 TABLET(10 MG) BY MOUTH DAILY  . furosemide (LASIX) 20 MG tablet Take 0.5 tablets (10 mg total) by mouth daily.  . isosorbide mononitrate (IMDUR) 60 MG 24 hr tablet Take 1 tablet (60 mg total) by mouth daily.  [name] losartan (COZAAR) 25 MG tablet Take 12.5 mg by mouth daily.  . Multiple Vitamin (MULTIVITAMIN) tablet Take 1 tablet by mouth daily.       Allergies:   Statins and Gabapentin    Social History   Socioeconomic History  . Marital status: Widowed    Spouse name: Not on file  . Number of children: Not on file  . Years of education: Not on file  . Highest education level: Not on file  Occupational History  . Not on file  Social Needs  . Financial resource strain: Not on file  . Food insecurity    Worry: Not on file    Inability: Not on file  . Transportation needs    Medical: Not on file    Non-medical: Not on file  Tobacco Use  . Smoking status: Passive Smoke Exposure - Never Smoker  . Smokeless tobacco: Never Used  Substance and Sexual Activity  . Alcohol use: Yes    Alcohol/week: 0.0 standard drinks    Comment: very seldom  . Drug use: No  . Sexual activity: Not Currently  Lifestyle  . Physical activity    Days per week: Not on file    Minutes per session: Not on file  . Stress: Not on file  Relationships  . Social Herbalist on phone: Not on file    Gets together: Not on file    Attends religious service: Not on file    Active member of club or organization: Not on file    Attends meetings of clubs or organizations: Not on file    Relationship status: Not on file  Other Topics Concern  . Not on file  Social History Narrative   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      She likes to read and she takes classes at [employer]     Family History: The patient's family history includes COPD in her father; Cancer in her father; Lung cancer in her unknown relative.  ROS:   Please see the history of present illness.    Positive for urinary incontinence and generalized fatigue.  All other systems reviewed and are negative.  EKGs/Labs/Other Studies Reviewed:    The following studies were reviewed today: Echo 10/05/2018: IMPRESSIONS    1. The left ventricle has hyperdynamic systolic function, with an ejection fraction of >65%. The cavity size was normal. There is moderately increased left  ventricular wall thickness with severe focal basal hypertrophy. Indeterminate diastolic filling  due to E-A fusion. Indeterminate filling pressures.  2. The right ventricle has normal systolic function. The cavity was normal. There is no increase in right ventricular wall thickness. Right ventricular systolic pressure is moderately elevated with an estimated pressure of 39.8 mmHg.  3. The mitral valve is abnormal. There is severe mitral annular calcification present. Mitral valve regurgitation is mild to moderate by color flow Doppler. Moderate-severe mitral valve stenosis. Mean diastolic MV gradient 9 mmHg at HR 67 bpm.  4. A 66mm  a Medtronic CoreValve valve is present in the aortic position. Procedure Date: 2014 Echo findings shows no evidence of rocking or dehiscence of the aortic prosthesis. Mild aortic valve regurgitation, appears prosthetic but assessment limited  by prosthesis and calcium shadowing. Using an LVOT diameter of 2.1 cm, calculated valve area is approximately 2 cm2, though this may be overestimated due to increased flow velocity in LVOT.  5. Left atrial size was severely dilated.  6. The inferior vena cava was normal in size with <50% respiratory variability.  7. When compared to the prior study: 08/28/2017 - no significant change in biventricular function, [name] prosthesis function or regurgitation, or degree of mitral stenosis. Side by side comparison of images performed.  FINDINGS  Left Ventricle: The left ventricle has hyperdynamic systolic function, with an ejection fraction of >65%. The cavity size was normal. There is moderately increased left ventricular wall thickness. Indeterminate diastolic filling due to E-A fusion.  Indeterminate filling pressures There is a small aneurismal area in the mid septum apparently due to a contained wire perforation during [name] . This aneurysmal area has not changed from previous echo.  Right Ventricle: The right ventricle has normal systolic  function. The cavity was normal. There is no increase in right ventricular wall thickness. Right ventricular systolic pressure is moderately elevated with an estimated pressure of 39.8 mmHg.  Left Atrium: Left atrial size was severely dilated.  Right Atrium: Right atrial size was normal in size. Right atrial pressure is estimated at 8 mmHg.  Interatrial Septum: No atrial level shunt detected by color flow Doppler.  Pericardium: There is no evidence of pericardial effusion.  Mitral Valve: The mitral valve is abnormal. There is severe mitral annular calcification present. Mitral valve regurgitation is mild to moderate by color flow Doppler. Moderate-severe mitral valve stenosis.  Tricuspid Valve: The tricuspid valve is normal in structure. Tricuspid valve regurgitation is trivial by color flow Doppler.  Aortic Valve: The aortic valve has been repaired/replaced Aortic valve regurgitation is mild by color flow Doppler. A 61mm Medtronic CoreValve valve is present in the aortic position. Procedure Date: 2014 Echo findings shows no evidence of rocking or  dehiscence of the aortic prosthesis.  Pulmonic Valve: The pulmonic valve was grossly normal. Pulmonic valve regurgitation is trivial by color flow Doppler.  Aorta: The aorta was not well visualized.    Venous: The inferior vena cava is normal in size with less than 50% respiratory variability.  Compared to previous exam: 08/28/2017 - no significant change in biventricular function, [name] prosthesis function or regurgitation, or degree of mitral stenosis. Side by side comparison of images performed.  EKG:  EKG is ordered today.  The ekg ordered today demonstrates normal sinus rhythm 74 bpm, first-degree AV block, left bundle branch block, no significant change from previous tracings.  Recent Labs: 12/25/2017: Hemoglobin 11.5; Platelets 277.0; TSH 1.62 02/25/2018: NT-Pro BNP 1,649 03/09/2018: BUN 36; Creatinine, Ser 1.44; Potassium 4.2;  Sodium 142  Recent Lipid Panel    Component Value Date/Time   CHOL 216 (H) 08/27/2017 1115   TRIG 182.0 (H) 08/27/2017 1115   HDL 45.80 08/27/2017 1115   CHOLHDL 5 08/27/2017 1115   VLDL 36.4 08/27/2017 1115   LDLCALC 134 (H) 08/27/2017 1115   LDLDIRECT 158.6 10/23/2012 1223    Physical Exam:    VS:  BP 136/60   Pulse 74   Ht 4\' 11"  (1.499 m)   Wt 161 lb (73 kg)   SpO2 95%   BMI 32.52 kg/m  Wt Readings from Last 3 Encounters:  10/21/18 161 lb (73 kg)  04/03/18 158 lb (71.7 kg)  03/23/18 157 lb 2 oz (71.3 kg)     GEN:  Well nourished, well developed pleasant elderly woman in no acute distress HEENT: Normal NECK: No JVD; No carotid bruits LYMPHATICS: No lymphadenopathy CARDIAC: RRR, 2/6 systolic ejection murmur, early peaking, at the right upper sternal border RESPIRATORY:  Clear to auscultation without rales, wheezing or rhonchi  ABDOMEN: Soft, non-tender, non-distended MUSCULOSKELETAL: 1+ bilateral pretibial edema; No deformity  SKIN: Warm and dry NEUROLOGIC:  Alert and oriented x 3 PSYCHIATRIC:  Normal affect   ASSESSMENT:    1. Essential hypertension   2. Chronic diastolic heart failure ([place])   3. Coronary artery disease involving native coronary artery of native heart with angina pectoris ([place])   4. S/P [name] (transcatheter aortic valve replacement)    PLAN:    In order of problems listed above:  1. Blood pressure well controlled.  Continue current medical therapy. 2. Discussed sodium restriction, leg elevation, as needed increase in furosemide to 20 mg daily.  Overall symptoms appear stable.  Recent echo study reviewed as above.  Last labs from December 2019 reviewed with stage III chronic kidney disease.  Will update labs today. 3. No anginal symptoms at present on a combination of isosorbide and carvedilol.  Continue aspirin for antiplatelet therapy. 4. The patient's recent echocardiogram shows normal function of her [name] bioprosthesis with moderately  severe mitral stenosis secondary to heavy annular calcification.  Continue medical therapy.   Medication Adjustments/Labs and Tests Ordered: Current medicines are reviewed at length with the patient today.  Concerns regarding medicines are outlined above.  Orders Placed This Encounter  Procedures  . Basic metabolic panel  . Basic metabolic panel  . EKG 12-Lead   No orders of the defined types were placed in this encounter.   Patient Instructions  Medication Instructions:  Your provider recommends that you continue on your current medications as directed. Please refer to the Current Medication list given to you today.    You may take Lasix 20 mg once daily as needed for swelling or shortness of breath.  Labwork: TODAY: BMET  Follow-Up: Your provider wants you to follow-up in: 6 months with Dr. Burt Knack. You will receive a reminder letter in the mail two months in advance. If you don't receive a letter, please call our office to schedule the follow-up appointment.      Signed, Sherren Mocha, MD  10/21/2018 4:32 PM    [facility]

## 2018-10-21 NOTE — Patient Instructions (Addendum)
Medication Instructions:  Your provider recommends that you continue on your current medications as directed. Please refer to the Current Medication list given to you today.    You may take Lasix 20 mg once daily as needed for swelling or shortness of breath.  Labwork: TODAY: BMET  Follow-Up: Your provider wants you to follow-up in: 6 months with Dr. Burt Knack. You will receive a reminder letter in the mail two months in advance. If you don't receive a letter, please call our office to schedule the follow-up appointment.

## 2018-10-22 LAB — BASIC METABOLIC PANEL
BUN/Creatinine Ratio: 26 (ref 12–28)
BUN: 34 mg/dL (ref 10–36)
CO2: 21 mmol/L (ref 20–29)
Calcium: 9.5 mg/dL (ref 8.7–10.3)
Chloride: 105 mmol/L (ref 96–106)
Creatinine, Ser: 1.33 mg/dL — ABNORMAL HIGH (ref 0.57–1.00)
GFR calc Af Amer: 40 mL/min/{1.73_m2} — ABNORMAL LOW (ref >59)
GFR calc non Af Amer: 34 mL/min/{1.73_m2} — ABNORMAL LOW (ref >59)
Glucose: 92 mg/dL (ref 65–99)
Potassium: 4.6 mmol/L (ref 3.5–5.2)
Sodium: 143 mmol/L (ref 134–144)

## 2018-11-09 DIAGNOSIS — H401132 Primary open-angle glaucoma, bilateral, moderate stage: Secondary | ICD-10-CM | POA: Diagnosis not present

## 2018-11-09 DIAGNOSIS — H04123 Dry eye syndrome of bilateral lacrimal glands: Secondary | ICD-10-CM | POA: Diagnosis not present

## 2018-11-09 DIAGNOSIS — Z961 Presence of intraocular lens: Secondary | ICD-10-CM | POA: Diagnosis not present

## 2018-11-18 DIAGNOSIS — M545 Low back pain: Secondary | ICD-10-CM | POA: Diagnosis not present

## 2018-11-18 DIAGNOSIS — M9902 Segmental and somatic dysfunction of thoracic region: Secondary | ICD-10-CM | POA: Diagnosis not present

## 2018-11-18 DIAGNOSIS — M9905 Segmental and somatic dysfunction of pelvic region: Secondary | ICD-10-CM | POA: Diagnosis not present

## 2018-11-18 DIAGNOSIS — M9903 Segmental and somatic dysfunction of lumbar region: Secondary | ICD-10-CM | POA: Diagnosis not present

## 2018-11-25 DIAGNOSIS — M9903 Segmental and somatic dysfunction of lumbar region: Secondary | ICD-10-CM | POA: Diagnosis not present

## 2018-11-25 DIAGNOSIS — M9905 Segmental and somatic dysfunction of pelvic region: Secondary | ICD-10-CM | POA: Diagnosis not present

## 2018-11-25 DIAGNOSIS — M545 Low back pain: Secondary | ICD-10-CM | POA: Diagnosis not present

## 2018-11-25 DIAGNOSIS — M9902 Segmental and somatic dysfunction of thoracic region: Secondary | ICD-10-CM | POA: Diagnosis not present

## 2018-11-26 ENCOUNTER — Encounter: Payer: Self-pay | Admitting: Adult Health

## 2018-11-26 ENCOUNTER — Other Ambulatory Visit: Payer: Self-pay

## 2018-11-26 ENCOUNTER — Ambulatory Visit (INDEPENDENT_AMBULATORY_CARE_PROVIDER_SITE_OTHER): Payer: Medicare Other | Admitting: Adult Health

## 2018-11-26 VITALS — BP 124/70 | Temp 97.8°F | Wt 160.0 lb

## 2018-11-26 DIAGNOSIS — I10 Essential (primary) hypertension: Secondary | ICD-10-CM | POA: Diagnosis not present

## 2018-11-26 DIAGNOSIS — I2510 Atherosclerotic heart disease of native coronary artery without angina pectoris: Secondary | ICD-10-CM | POA: Diagnosis not present

## 2018-11-26 DIAGNOSIS — N183 Chronic kidney disease, stage 3 unspecified: Secondary | ICD-10-CM

## 2018-11-26 DIAGNOSIS — I5032 Chronic diastolic (congestive) heart failure: Secondary | ICD-10-CM

## 2018-11-26 DIAGNOSIS — I25119 Atherosclerotic heart disease of native coronary artery with unspecified angina pectoris: Secondary | ICD-10-CM | POA: Diagnosis not present

## 2018-11-26 DIAGNOSIS — E785 Hyperlipidemia, unspecified: Secondary | ICD-10-CM | POA: Diagnosis not present

## 2018-11-26 DIAGNOSIS — E1121 Type 2 diabetes mellitus with diabetic nephropathy: Secondary | ICD-10-CM

## 2018-11-26 LAB — COMPREHENSIVE METABOLIC PANEL
ALT: 10 U/L (ref 0–35)
AST: 15 U/L (ref 0–37)
Albumin: 3.8 g/dL (ref 3.5–5.2)
Alkaline Phosphatase: 54 U/L (ref 39–117)
BUN: 37 mg/dL — ABNORMAL HIGH (ref 6–23)
CO2: 27 mEq/L (ref 19–32)
Calcium: 9 mg/dL (ref 8.4–10.5)
Chloride: 107 mEq/L (ref 96–112)
Creatinine, Ser: 1.36 mg/dL — ABNORMAL HIGH (ref 0.40–1.20)
GFR: 36.25 mL/min — ABNORMAL LOW (ref >60.00)
Glucose, Bld: 87 mg/dL (ref 70–99)
Potassium: 4.5 mEq/L (ref 3.5–5.1)
Sodium: 142 mEq/L (ref 135–145)
Total Bilirubin: 0.4 mg/dL (ref 0.2–1.2)
Total Protein: 6.9 g/dL (ref 6.0–8.3)

## 2018-11-26 LAB — LIPID PANEL
Cholesterol: 212 mg/dL — ABNORMAL HIGH (ref 0–200)
HDL: 39.5 mg/dL (ref >39.00)
NonHDL: 172.77
Total CHOL/HDL Ratio: 5
Triglycerides: 208 mg/dL — ABNORMAL HIGH (ref 0.0–149.0)
VLDL: 41.6 mg/dL — ABNORMAL HIGH (ref 0.0–40.0)

## 2018-11-26 LAB — CBC WITH DIFFERENTIAL/PLATELET
Basophils Absolute: 0 10*3/uL (ref 0.0–0.1)
Basophils Relative: 0.5 % (ref 0.0–3.0)
Eosinophils Absolute: 0.3 10*3/uL (ref 0.0–0.7)
Eosinophils Relative: 4.6 % (ref 0.0–5.0)
HCT: 33.7 % — ABNORMAL LOW (ref 36.0–46.0)
Hemoglobin: 11.3 g/dL — ABNORMAL LOW (ref 12.0–15.0)
Lymphocytes Relative: 21.8 % (ref 12.0–46.0)
Lymphs Abs: 1.4 10*3/uL (ref 0.7–4.0)
MCHC: 33.6 g/dL (ref 30.0–36.0)
MCV: 91.8 fl (ref 78.0–100.0)
Monocytes Absolute: 0.6 10*3/uL (ref 0.1–1.0)
Monocytes Relative: 9.9 % (ref 3.0–12.0)
Neutro Abs: 4 10*3/uL (ref 1.4–7.7)
Neutrophils Relative %: 63.2 % (ref 43.0–77.0)
Platelets: 209 10*3/uL (ref 150.0–400.0)
RBC: 3.67 Mil/uL — ABNORMAL LOW (ref 3.87–5.11)
RDW: 14.2 % (ref 11.5–15.5)
WBC: 6.4 10*3/uL (ref 4.0–10.5)

## 2018-11-26 LAB — TSH: TSH: 1.63 u[IU]/mL (ref 0.35–4.50)

## 2018-11-26 LAB — LDL CHOLESTEROL, DIRECT: Direct LDL: 143 mg/dL

## 2018-11-26 LAB — HEMOGLOBIN A1C: Hgb A1c MFr Bld: 6.2 % (ref 4.6–6.5)

## 2018-11-26 NOTE — Progress Notes (Signed)
Subjective:    Patient ID: Sara Garza, female    DOB: 23-Apr-1923, 85 y.o.   MRN: [id number]  HPI  Patient presents for yearly preventative medicine examination. She is a pleasant 85 year old female who  has a past medical history of Arthritis, CAD (coronary artery disease), Carotid stenosis, left, Diabetes mellitus, Glaucoma, Hyperlipidemia, Hypertension, Leg pain, Osteoporosis, and Valvular heart disease.  Essential Hypertension -currently prescribed Norvasc 5 mg, Coreg 3.125 mg twice daily, and Cozaar 12.5 mg daily.  Currently she has been getting readings of 130s over 70s to 80s at home.  She denies dizziness, lightheadedness or syncopal episodes  BP Readings from Last 3 Encounters:  11/26/18 124/70  10/21/18 136/60  04/03/18 (!) 146/60   She is seen by cardiology for history of CAD, aortic stenosis, and chronic diastolic heart failure.  She underwent [name] for severe symptomatic aortic stenosis in 2014.  She does report that she continues to have issues with shortness of breath with activity.  She also continues to have mild leg swelling with pain that has been unchanged over the years.  She takes Lasix 10 mg daily when she does not plan to leave the [place].  Due to urinary incontinence she cannot tolerate an increased dose.  She has not had any chest pain or pressure and does not feel as though she has any palpitations.  Her most recent echo showed normal function of her [name] bioprosthesis, vigorous LV function, and normal right heart function, as well as moderately severe mitral valve stenosis and a heavy mitral annular calcification.  Does have a history of diabetes but has not been on medications for an extended period of time.  Her A1c last year was 6.2.   All immunizations and health maintenance protocols were reviewed with the patient and needed orders were placed.  Appropriate screening laboratory values were ordered for the patient including screening of hyperlipidemia, renal  function and hepatic function.   Medication reconciliation,  past medical history, social history, problem list and allergies were reviewed in detail with the patient  Goals were established with regard to weight loss, exercise, and  diet in compliance with medications  End of life planning was discussed.  She has an advanced directive and living will and her daughter knows her wishes   Review of Systems  Constitutional: Positive for activity change.  HENT: Positive for hearing loss.   Respiratory: Positive for shortness of breath. Negative for chest tightness and wheezing.   Cardiovascular: Positive for leg swelling. Negative for chest pain and palpitations.  Gastrointestinal: Negative.   Endocrine: Negative.   Genitourinary: Positive for frequency.  Musculoskeletal: Positive for arthralgias and gait problem.  Allergic/Immunologic: Negative.   Hematological: Negative.   Psychiatric/Behavioral: Negative.   All other systems reviewed and are negative.  Past Medical History:  Diagnosis Date  . Arthritis   . CAD (coronary artery disease)   . Carotid stenosis, left    50-60% stable  . Diabetes mellitus   . Glaucoma   . Hyperlipidemia   . Hypertension   . Leg pain    ABIs 2/18: normal bilaterally.  . Osteoporosis   . Valvular heart disease    Aortic Stenosis s/p [name] 2014 // Mitral stenosis // Echo 09/2018: EF > 65, mod LVH, severe focal basal hypertrophy, RVSP 39.8, mild to mod MR, mod to severe MS (mean 9), AVR with mild AI, severe LAE (similar to prior echo)    Social History   Socioeconomic History  .  Marital status: Widowed    Spouse name: Not on file  . Number of children: Not on file  . Years of education: Not on file  . Highest education level: Not on file  Occupational History  . Not on file  Social Needs  . Financial resource strain: Not on file  . Food insecurity    Worry: Not on file    Inability: Not on file  . Transportation needs    Medical: Not on  file    Non-medical: Not on file  Tobacco Use  . Smoking status: Passive Smoke Exposure - Never Smoker  . Smokeless tobacco: Never Used  Substance and Sexual Activity  . Alcohol use: Yes    Alcohol/week: 0.0 standard drinks    Comment: very seldom  . Drug use: No  . Sexual activity: Not Currently  Lifestyle  . Physical activity    Days per week: Not on file    Minutes per session: Not on file  . Stress: Not on file  Relationships  . Social Herbalist on phone: Not on file    Gets together: Not on file    Attends religious service: Not on file    Active member of club or organization: Not on file    Attends meetings of clubs or organizations: Not on file    Relationship status: Not on file  . Intimate partner violence    Fear of current or ex partner: Not on file    Emotionally abused: Not on file    Physically abused: Not on file    Forced sexual activity: Not on file  Other Topics Concern  . Not on file  Social History Narrative   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      She likes to read and she takes classes at [employer]    Past Surgical History:  Procedure Laterality Date  . ANOMALOUS PULMONARY VENOUS RETURN REPAIR, TOTAL    . APPENDECTOMY    . CARDIAC VALVE REPLACEMENT  04/29/2012  . Cataracts Bilateral   . CESAREAN SECTION    . FOOT SURGERY     [name]  . HIP ARTHROPLASTY Right 07/31/2013   Procedure: ARTHROPLASTY BIPOLAR HIP;  Surgeon: [name], MD;  Location: [facility];  Service: Orthopedics;  Laterality: Right;  . PERCUTANEOUS CORONARY STENT INTERVENTION (PCI-S) N/A 01/02/2012   Procedure: PERCUTANEOUS CORONARY STENT INTERVENTION (PCI-S);  Surgeon: Sherren Mocha, MD;  Location: [facility] CATH LAB;  Service: Cardiovascular;  Laterality: N/A;    Family History  Problem Relation Age of Onset  . COPD Father   . Cancer Father        lung cancer  . Lung cancer Unknown     Allergies  Allergen Reactions  .  Statins Other (See Comments)    Muscle aches  . Gabapentin Other (See Comments)    SOMNOLENCE    Current Outpatient Medications on File Prior to Visit  Medication Sig Dispense Refill  . amLODipine (NORVASC) 5 MG tablet TAKE 1 TABLET(5 MG) BY MOUTH DAILY 90 tablet 1  . aspirin EC 81 MG tablet Take 1 tablet (81 mg total) by mouth daily. Start after lovenox injections are done after 2 weeks    . bimatoprost (LUMIGAN) 0.01 % SOLN Place 1 drop into both eyes at bedtime.     . Calcium Carb-Cholecalciferol (CALCIUM 500 +D) 500-400 MG-UNIT TABS Take 1 tablet by mouth daily.     [name]  carvedilol (COREG) 3.125 MG tablet TAKE 1 TABLET(3.125 MG) BY MOUTH TWICE DAILY WITH A MEAL 180 tablet 0  . ezetimibe (ZETIA) 10 MG tablet TAKE 1 TABLET(10 MG) BY MOUTH DAILY 30 tablet 11  . furosemide (LASIX) 20 MG tablet Take 0.5 tablets (10 mg total) by mouth daily. 90 tablet 3  . isosorbide mononitrate (IMDUR) 60 MG 24 hr tablet Take 1 tablet (60 mg total) by mouth daily. 135 tablet 4  . losartan (COZAAR) 25 MG tablet Take 12.5 mg by mouth daily.    . Multiple Vitamin (MULTIVITAMIN) tablet Take 1 tablet by mouth daily.      [name] amoxicillin (AMOXIL) 500 MG capsule TK 4 CAPSULES PO 1 HOUR PRIOR TO DENTAL PROCEDURES     No current facility-administered medications on file prior to visit.     BP 124/70 (BP Location: Left Wrist)   Temp 97.8 F (36.6 C)   Wt 160 lb (72.6 kg)   BMI 32.32 kg/m       Objective:   Physical Exam Vitals signs and nursing note reviewed.  Constitutional:      Appearance: Normal appearance.  HENT:     Head: Normocephalic and atraumatic.     Right Ear: Tympanic membrane, ear canal and external ear normal. There is no impacted cerumen.     Left Ear: Tympanic membrane, ear canal and external ear normal.     Nose: Nose normal. No congestion or rhinorrhea.  Neck:     Musculoskeletal: Normal range of motion and neck supple.     Vascular: No carotid bruit.  Cardiovascular:     Rate and  Rhythm: Normal rate and regular rhythm.     Pulses: Normal pulses.     Heart sounds: Murmur present. Systolic murmur present with a grade of 2/6. No friction rub. No gallop.   Pulmonary:     Effort: Pulmonary effort is normal. No respiratory distress.     Breath sounds: Normal breath sounds. No stridor. No wheezing, rhonchi or rales.  Chest:     Chest wall: No tenderness.  Abdominal:     General: Abdomen is flat. Bowel sounds are normal. There is no distension.     Palpations: There is no mass.     Tenderness: There is no abdominal tenderness. There is no right CVA tenderness, left CVA tenderness, guarding or rebound.     Hernia: No hernia is present.  Musculoskeletal:     Right lower leg: Edema present.     Left lower leg: Edema present.  Skin:    General: Skin is warm and dry.     Capillary Refill: Capillary refill takes less than 2 seconds.  Neurological:     General: No focal deficit present.     Mental Status: She is alert. Mental status is at baseline. She is disoriented.     Gait: Gait abnormal (walks with single prong cane).  Psychiatric:        Mood and Affect: Mood normal.        Behavior: Behavior normal.        Thought Content: Thought content normal.        Judgment: Judgment normal.       Assessment & Plan:  1. Chronic diastolic CHF (congestive heart failure) (HCC) - Lasix 10 mg daily. [place] salt diet, elevate legs at rest - CBC with Differential/Platelet - Comprehensive metabolic panel - Hemoglobin A1c - Lipid panel - TSH  2. Essential hypertension - Well controlled. Continue with current treatment  -  CBC with Differential/Platelet - Comprehensive metabolic panel - Hemoglobin A1c - Lipid panel - TSH  3. Controlled type 2 diabetes mellitus with diabetic nephropathy, without long-term current use of insulin ([place]) - consider low dose metformin  - CBC with Differential/Platelet - Comprehensive metabolic panel - Hemoglobin A1c - Lipid panel - TSH  4.  Coronary artery disease involving native coronary artery of native heart without angina pectoris Continue with Cardiology plan of care - CBC with Differential/Platelet - Comprehensive metabolic panel - Hemoglobin A1c - Lipid panel - TSH  5. CKD (chronic kidney disease), stage III (HCC) - continue to monitor  - avoid nsaids and low salt diet  - CBC with Differential/Platelet - Comprehensive metabolic panel - Hemoglobin A1c - Lipid panel - TSH  6. Dyslipidemia - Continue with Zetia  - CBC with Differential/Platelet - Comprehensive metabolic panel - Hemoglobin A1c - Lipid panel - TSH   [name], NP

## 2018-11-26 NOTE — Progress Notes (Signed)
Lab Results  Component Value Date   HGBA1C 6.2 08/27/2017

## 2018-11-30 ENCOUNTER — Telehealth: Payer: Self-pay | Admitting: Adult Health

## 2018-11-30 DIAGNOSIS — M545 Low back pain: Secondary | ICD-10-CM | POA: Diagnosis not present

## 2018-11-30 DIAGNOSIS — M9902 Segmental and somatic dysfunction of thoracic region: Secondary | ICD-10-CM | POA: Diagnosis not present

## 2018-11-30 DIAGNOSIS — M9905 Segmental and somatic dysfunction of pelvic region: Secondary | ICD-10-CM | POA: Diagnosis not present

## 2018-11-30 DIAGNOSIS — M9903 Segmental and somatic dysfunction of lumbar region: Secondary | ICD-10-CM | POA: Diagnosis not present

## 2018-11-30 NOTE — Telephone Encounter (Signed)
Copied from [place] [id number]. Topic: General - Other >> Nov 30, 2018  3:04 PM Rainey Pines A wrote: Patient would like a callback from nurse with lab results.

## 2018-12-01 NOTE — Telephone Encounter (Signed)
Sara Garza has spoke to the pt.  Nothing further needed.

## 2018-12-01 NOTE — Telephone Encounter (Signed)
Pt wanting to know the results of her labs. Please call pt

## 2018-12-04 DIAGNOSIS — M9903 Segmental and somatic dysfunction of lumbar region: Secondary | ICD-10-CM | POA: Diagnosis not present

## 2018-12-04 DIAGNOSIS — M9905 Segmental and somatic dysfunction of pelvic region: Secondary | ICD-10-CM | POA: Diagnosis not present

## 2018-12-04 DIAGNOSIS — M9902 Segmental and somatic dysfunction of thoracic region: Secondary | ICD-10-CM | POA: Diagnosis not present

## 2018-12-04 DIAGNOSIS — M545 Low back pain: Secondary | ICD-10-CM | POA: Diagnosis not present

## 2018-12-08 DIAGNOSIS — M9905 Segmental and somatic dysfunction of pelvic region: Secondary | ICD-10-CM | POA: Diagnosis not present

## 2018-12-08 DIAGNOSIS — M545 Low back pain: Secondary | ICD-10-CM | POA: Diagnosis not present

## 2018-12-08 DIAGNOSIS — M9902 Segmental and somatic dysfunction of thoracic region: Secondary | ICD-10-CM | POA: Diagnosis not present

## 2018-12-08 DIAGNOSIS — M9903 Segmental and somatic dysfunction of lumbar region: Secondary | ICD-10-CM | POA: Diagnosis not present

## 2018-12-09 NOTE — Telephone Encounter (Signed)
Labs re sent by mail.  Nothing further needed.

## 2018-12-09 NOTE — Telephone Encounter (Signed)
Pt called and stated she still hasn't received her lab results in the mail as she had requested. Please call pt to advise.

## 2018-12-11 DIAGNOSIS — M9902 Segmental and somatic dysfunction of thoracic region: Secondary | ICD-10-CM | POA: Diagnosis not present

## 2018-12-11 DIAGNOSIS — M9903 Segmental and somatic dysfunction of lumbar region: Secondary | ICD-10-CM | POA: Diagnosis not present

## 2018-12-11 DIAGNOSIS — M545 Low back pain: Secondary | ICD-10-CM | POA: Diagnosis not present

## 2018-12-11 DIAGNOSIS — M9905 Segmental and somatic dysfunction of pelvic region: Secondary | ICD-10-CM | POA: Diagnosis not present

## 2018-12-14 ENCOUNTER — Telehealth: Payer: Self-pay | Admitting: Cardiovascular Disease

## 2018-12-14 ENCOUNTER — Other Ambulatory Visit: Payer: Self-pay | Admitting: Adult Health

## 2018-12-14 DIAGNOSIS — M9903 Segmental and somatic dysfunction of lumbar region: Secondary | ICD-10-CM | POA: Diagnosis not present

## 2018-12-14 DIAGNOSIS — M545 Low back pain: Secondary | ICD-10-CM | POA: Diagnosis not present

## 2018-12-14 DIAGNOSIS — M9902 Segmental and somatic dysfunction of thoracic region: Secondary | ICD-10-CM | POA: Diagnosis not present

## 2018-12-14 DIAGNOSIS — M9905 Segmental and somatic dysfunction of pelvic region: Secondary | ICD-10-CM | POA: Diagnosis not present

## 2018-12-14 NOTE — Telephone Encounter (Signed)
  Patient is calling because she is contemplating acupuncture and wants to know if it is okay for her to get it done. Can leave detailed message if no answer.

## 2018-12-14 NOTE — Telephone Encounter (Signed)
Informed the patient Dr. Burt Knack is fine with her trying acupuncture and that he hopes it helps her symptoms. She was grateful for call and Dr. Antionette Char kind words.

## 2018-12-14 NOTE — Telephone Encounter (Signed)
I'm fine with Sara Garza having acupuncture. Please let her know I hope that it helps her. thx

## 2018-12-15 DIAGNOSIS — Z23 Encounter for immunization: Secondary | ICD-10-CM | POA: Diagnosis not present

## 2018-12-16 DIAGNOSIS — H43813 Vitreous degeneration, bilateral: Secondary | ICD-10-CM | POA: Diagnosis not present

## 2018-12-16 DIAGNOSIS — H353132 Nonexudative age-related macular degeneration, bilateral, intermediate dry stage: Secondary | ICD-10-CM | POA: Diagnosis not present

## 2018-12-18 DIAGNOSIS — M9905 Segmental and somatic dysfunction of pelvic region: Secondary | ICD-10-CM | POA: Diagnosis not present

## 2018-12-18 DIAGNOSIS — M545 Low back pain: Secondary | ICD-10-CM | POA: Diagnosis not present

## 2018-12-18 DIAGNOSIS — M9902 Segmental and somatic dysfunction of thoracic region: Secondary | ICD-10-CM | POA: Diagnosis not present

## 2018-12-18 DIAGNOSIS — M9903 Segmental and somatic dysfunction of lumbar region: Secondary | ICD-10-CM | POA: Diagnosis not present

## 2018-12-20 ENCOUNTER — Other Ambulatory Visit: Payer: Self-pay | Admitting: Adult Health

## 2018-12-21 DIAGNOSIS — M545 Low back pain: Secondary | ICD-10-CM | POA: Diagnosis not present

## 2018-12-21 DIAGNOSIS — M9903 Segmental and somatic dysfunction of lumbar region: Secondary | ICD-10-CM | POA: Diagnosis not present

## 2018-12-21 DIAGNOSIS — M9905 Segmental and somatic dysfunction of pelvic region: Secondary | ICD-10-CM | POA: Diagnosis not present

## 2018-12-21 DIAGNOSIS — M9902 Segmental and somatic dysfunction of thoracic region: Secondary | ICD-10-CM | POA: Diagnosis not present

## 2018-12-22 NOTE — Telephone Encounter (Signed)
Ok to send in for 90 +1

## 2018-12-23 NOTE — Telephone Encounter (Signed)
Sent to the pharmacy as instructed. 

## 2018-12-25 DIAGNOSIS — M9905 Segmental and somatic dysfunction of pelvic region: Secondary | ICD-10-CM | POA: Diagnosis not present

## 2018-12-25 DIAGNOSIS — M9903 Segmental and somatic dysfunction of lumbar region: Secondary | ICD-10-CM | POA: Diagnosis not present

## 2018-12-25 DIAGNOSIS — M545 Low back pain: Secondary | ICD-10-CM | POA: Diagnosis not present

## 2018-12-25 DIAGNOSIS — M9902 Segmental and somatic dysfunction of thoracic region: Secondary | ICD-10-CM | POA: Diagnosis not present

## 2018-12-31 DIAGNOSIS — M545 Low back pain: Secondary | ICD-10-CM | POA: Diagnosis not present

## 2018-12-31 DIAGNOSIS — M9902 Segmental and somatic dysfunction of thoracic region: Secondary | ICD-10-CM | POA: Diagnosis not present

## 2018-12-31 DIAGNOSIS — M9903 Segmental and somatic dysfunction of lumbar region: Secondary | ICD-10-CM | POA: Diagnosis not present

## 2018-12-31 DIAGNOSIS — M9905 Segmental and somatic dysfunction of pelvic region: Secondary | ICD-10-CM | POA: Diagnosis not present

## 2019-01-06 DIAGNOSIS — M9905 Segmental and somatic dysfunction of pelvic region: Secondary | ICD-10-CM | POA: Diagnosis not present

## 2019-01-06 DIAGNOSIS — M9903 Segmental and somatic dysfunction of lumbar region: Secondary | ICD-10-CM | POA: Diagnosis not present

## 2019-01-06 DIAGNOSIS — M9902 Segmental and somatic dysfunction of thoracic region: Secondary | ICD-10-CM | POA: Diagnosis not present

## 2019-01-06 DIAGNOSIS — M545 Low back pain: Secondary | ICD-10-CM | POA: Diagnosis not present

## 2019-01-20 DIAGNOSIS — M9905 Segmental and somatic dysfunction of pelvic region: Secondary | ICD-10-CM | POA: Diagnosis not present

## 2019-01-20 DIAGNOSIS — M9903 Segmental and somatic dysfunction of lumbar region: Secondary | ICD-10-CM | POA: Diagnosis not present

## 2019-01-20 DIAGNOSIS — M545 Low back pain: Secondary | ICD-10-CM | POA: Diagnosis not present

## 2019-01-20 DIAGNOSIS — M9902 Segmental and somatic dysfunction of thoracic region: Secondary | ICD-10-CM | POA: Diagnosis not present

## 2019-01-28 ENCOUNTER — Ambulatory Visit: Payer: Medicare Other | Admitting: Adult Health

## 2019-01-29 DIAGNOSIS — M9902 Segmental and somatic dysfunction of thoracic region: Secondary | ICD-10-CM | POA: Diagnosis not present

## 2019-01-29 DIAGNOSIS — M9903 Segmental and somatic dysfunction of lumbar region: Secondary | ICD-10-CM | POA: Diagnosis not present

## 2019-01-29 DIAGNOSIS — M9905 Segmental and somatic dysfunction of pelvic region: Secondary | ICD-10-CM | POA: Diagnosis not present

## 2019-01-29 DIAGNOSIS — M545 Low back pain: Secondary | ICD-10-CM | POA: Diagnosis not present

## 2019-02-02 ENCOUNTER — Other Ambulatory Visit: Payer: Self-pay | Admitting: Cardiovascular Disease

## 2019-02-02 MED ORDER — AMOXICILLIN 500 MG PO CAPS: ORAL_CAPSULE | ORAL | 4 refills | Status: DC

## 2019-02-02 NOTE — Telephone Encounter (Signed)
Pt's pharmacy is requesting a refill on amoxicillin for a dental procedure. Please address

## 2019-02-18 ENCOUNTER — Other Ambulatory Visit: Payer: Self-pay | Admitting: Cardiovascular Disease

## 2019-02-18 DIAGNOSIS — I25119 Atherosclerotic heart disease of native coronary artery with unspecified angina pectoris: Secondary | ICD-10-CM

## 2019-02-18 DIAGNOSIS — I10 Essential (primary) hypertension: Secondary | ICD-10-CM

## 2019-02-18 DIAGNOSIS — I359 Nonrheumatic aortic valve disorder, unspecified: Secondary | ICD-10-CM

## 2019-02-19 ENCOUNTER — Other Ambulatory Visit: Payer: Self-pay

## 2019-02-23 ENCOUNTER — Ambulatory Visit: Payer: Self-pay

## 2019-02-23 ENCOUNTER — Telehealth: Payer: Self-pay | Admitting: Adult Health

## 2019-02-23 NOTE — Telephone Encounter (Signed)
Pt called in to make provider aware that her BP has been running higher than usual. Pt says that she checked it today and it was  172/82. Pt says that she had a head ache today and yesterday. Pt says that she is taking 3 BP medications    Transferred call to NT

## 2019-02-23 NOTE — Telephone Encounter (Signed)
Incoming call  From Patient who reports elevate blood Pressures .  Patient states that she fills fine.   Just wanted to make [facility] aware. During triage   172/82 and 168/86 Previous values are 173/83 1677/83 183/85 173/83171/79  140/77after medication  178/86. Reviewed protocol with Patient would not go to Urgent Care or ED.  Patient uses [facility] on [address] and [address] .  Request a return call from [facility] and /or Staff     Reason for Disposition . [4] Systolic BP  >= 175 OR Diastolic >= 80 AND [3] pregnant  Answer Assessment - Initial Assessment Questions 1. BLOOD PRESSURE: "What is the blood pressure?" "Did you take at least two measurements 5 minutes apart?"     172/82  168/86 2. ONSET: "When did you take your blood pressure?"    2 weeks3. HOW: "How did you obtain the blood pressure?" (e.g., visiting nurse, automatic home BP monitor)     automatic 4. HISTORY: "Do you have a history of high blood pressure?"     yes 5. MEDICATIONS: "Are you taking any medications for blood pressure?" "Have you missed any doses recently?"     denies 6. OTHER SYMPTOMS: "Do you have any symptoms?" (e.g., headache, chest pain, blurred vision, difficulty breathing, weakness)     headache 7. PREGNANCY: "Is there any chance you are pregnant?" "When was your last menstrual period?"     na  Protocols used: HIGH BLOOD PRESSURE-A-AH

## 2019-02-23 NOTE — Telephone Encounter (Signed)
See nurse triage encounter.  Will close this encounter.

## 2019-02-24 ENCOUNTER — Other Ambulatory Visit: Payer: Self-pay | Admitting: Family Medicine

## 2019-02-24 MED ORDER — AMLODIPINE BESYLATE 5 MG PO TABS: 7.5000 mg | ORAL_TABLET | Freq: Every day | ORAL | 0 refills | Status: DC

## 2019-02-24 NOTE — Telephone Encounter (Signed)
I would like to add an additional 2.5 mg of Norvasc to her regimen to equal a total of 7.5 mg daily. Please send in to pharmacy if she is ok with this

## 2019-02-24 NOTE — Telephone Encounter (Signed)
Sent to the pharmacy by e-scribe. 

## 2019-03-04 DIAGNOSIS — M6281 Muscle weakness (generalized): Secondary | ICD-10-CM | POA: Diagnosis not present

## 2019-03-09 DIAGNOSIS — M6281 Muscle weakness (generalized): Secondary | ICD-10-CM | POA: Diagnosis not present

## 2019-03-20 ENCOUNTER — Other Ambulatory Visit: Payer: Self-pay | Admitting: Adult Health

## 2019-04-04 ENCOUNTER — Other Ambulatory Visit: Payer: Self-pay | Admitting: Cardiovascular Disease

## 2019-04-04 DIAGNOSIS — I10 Essential (primary) hypertension: Secondary | ICD-10-CM

## 2019-04-04 DIAGNOSIS — I25119 Atherosclerotic heart disease of native coronary artery with unspecified angina pectoris: Secondary | ICD-10-CM

## 2019-04-14 DIAGNOSIS — M5416 Radiculopathy, lumbar region: Secondary | ICD-10-CM | POA: Diagnosis not present

## 2019-04-19 ENCOUNTER — Encounter: Payer: Self-pay | Admitting: Cardiovascular Disease

## 2019-04-19 ENCOUNTER — Ambulatory Visit (INDEPENDENT_AMBULATORY_CARE_PROVIDER_SITE_OTHER): Payer: Medicare Other | Admitting: Cardiovascular Disease

## 2019-04-19 ENCOUNTER — Other Ambulatory Visit: Payer: Self-pay

## 2019-04-19 ENCOUNTER — Telehealth: Payer: Self-pay | Admitting: Cardiovascular Disease

## 2019-04-19 VITALS — BP 130/62 | HR 90 | Ht 59.0 in | Wt 160.0 lb

## 2019-04-19 DIAGNOSIS — I5032 Chronic diastolic (congestive) heart failure: Secondary | ICD-10-CM

## 2019-04-19 DIAGNOSIS — I25119 Atherosclerotic heart disease of native coronary artery with unspecified angina pectoris: Secondary | ICD-10-CM | POA: Diagnosis not present

## 2019-04-19 DIAGNOSIS — I359 Nonrheumatic aortic valve disorder, unspecified: Secondary | ICD-10-CM

## 2019-04-19 DIAGNOSIS — I10 Essential (primary) hypertension: Secondary | ICD-10-CM

## 2019-04-19 MED ORDER — FUROSEMIDE 20 MG PO TABS: 20.0000 mg | ORAL_TABLET | Freq: Every day | ORAL | 3 refills | Status: DC

## 2019-04-19 NOTE — Telephone Encounter (Signed)
Patient wanted to make sure her Daughter [name] would be able to come with her to her appointment today with Dr. Burt Knack. The patient states her daughter has come with her in the past. Please let the patient know what the office decides

## 2019-04-19 NOTE — Patient Instructions (Signed)
Medication Instructions:  1) STOP MYRBETRIQ 2) TAKE LASIX 40 mg tomorrow only, then decrease to 20 mg daily  Labwork: Your provider recommends that you return for lab work in: 2 weeks.  Follow-Up: You will be called to arrange your 6 month echo and office visit with Dr. Burt Knack when his July 2021 schedule is available.

## 2019-04-19 NOTE — Telephone Encounter (Signed)
The patient states Sara Garza helps her balance/ambulate. Informed her she is allowed to come to the visit. She was grateful for assistance.

## 2019-04-19 NOTE — Progress Notes (Signed)
Cardiology Office Note:    Date:  04/20/2019   ID:  Sara Garza, DOB Feb 09, 1924, MRN [id number]  PCP:  Sara Peng, NP  Cardiologist:  Sara Mocha, MD  Electrophysiologist:  None   Referring MD: Sara Peng, NP   Chief Complaint  Patient presents with  . Shortness of Breath    History of Present Illness:    Sara Garza is a 86 y.o. female with a hx of coronary artery disease, aortic stenosis, and chronic diastolic heart failure, presenting for follow-up evaluation.  She underwent for [name] for treatment of severe symptomatic aortic stenosis in 2014. She was treated with a 23 mm Medtronic Corevalve through the moderate risk SurTAVI aortic stenosis trial. She also is followed for moderate coronary artery disease which was evaluated with pressure wire analysis of the RCA and LCx demonstrating no hemodynamic evidence of flow-obstruction. 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  The patient is here with her daughter today. She's concerned that Myrbetriq is raising her blood pressure. She brings in the bottle and states that it hasn't really helped her incontinence much.  She is having shortness of breath with low-level activity.  She also complains of orthopnea, PND, and cough with clear sputum.  She has occasional chest pain that feels like a tightness in the center of her chest.  She complains of leg swelling.  No lightheadedness or syncope.  She has been taking furosemide 10 mg daily and has used a low-dose because of problems with incontinence.  She does not take furosemide when she has to go out of the Perfect and run errands.  Past Medical History:  Diagnosis Date  . Arthritis   . CAD (coronary artery disease)   . Carotid stenosis, left    50-60% stable  . Diabetes mellitus   . Glaucoma   . Hyperlipidemia   . Hypertension   . Leg pain    ABIs 2/18: normal bilaterally.    . Osteoporosis   . Valvular heart disease    Aortic Stenosis s/p [name] 2014 // Mitral stenosis // Echo 09/2018: EF > 65, mod LVH, severe focal basal hypertrophy, RVSP 39.8, mild to mod MR, mod to severe MS (mean 9), AVR with mild AI, severe LAE (similar to prior echo)    Past Surgical History:  Procedure Laterality Date  . ANOMALOUS PULMONARY VENOUS RETURN REPAIR, TOTAL    . APPENDECTOMY    . CARDIAC VALVE REPLACEMENT  04/29/2012  . Cataracts Bilateral   . CESAREAN SECTION    . FOOT SURGERY     [name]  . HIP ARTHROPLASTY Right 07/31/2013   Procedure: ARTHROPLASTY BIPOLAR HIP;  Surgeon: [name], MD;  Location: [facility]S;  Service: Orthopedics;  Laterality: Right;  . PERCUTANEOUS CORONARY STENT INTERVENTION (PCI-S) N/A 01/02/2012   Procedure: PERCUTANEOUS CORONARY STENT INTERVENTION (PCI-S);  Surgeon: Sara Mocha, MD;  Location: [facility] CATH LAB;  Service: Cardiovascular;  Laterality: N/A;    Current Medications: Current Meds  Medication Sig  . amLODipine (NORVASC) 5 MG tablet Take 1.5 tablets (7.5 mg total) by mouth daily.  [name] amoxicillin (AMOXIL) 500 MG capsule TK 4 CAPSULES PO 1 HOUR PRIOR TO DENTAL PROCEDURES  . aspirin EC 81 MG tablet Take 1 tablet (81 mg total) by mouth daily. Start after lovenox injections are done after 2 weeks  . bimatoprost (LUMIGAN) 0.01 % SOLN Place 1 drop into both  eyes at bedtime.   . Calcium Carb-Cholecalciferol (CALCIUM 500 +D) 500-400 MG-UNIT TABS Take 1 tablet by mouth daily.   . carvedilol (COREG) 3.125 MG tablet TAKE 1 TABLET(3.125 MG) BY MOUTH TWICE DAILY WITH A MEAL  . ezetimibe (ZETIA) 10 MG tablet TAKE 1 TABLET(10 MG) BY MOUTH DAILY  . furosemide (LASIX) 20 MG tablet Take 1 tablet (20 mg total) by mouth daily.  . isosorbide mononitrate (IMDUR) 60 MG 24 hr tablet TAKE 1 TABLET(60 MG) BY MOUTH DAILY  . losartan (COZAAR) 25 MG tablet TAKE 1 TABLET(25 MG) BY MOUTH DAILY  . Multiple Vitamin (MULTIVITAMIN) tablet Take 1 tablet by mouth daily.    .  [DISCONTINUED] furosemide (LASIX) 20 MG tablet TAKE 1/2 TABLET(10 MG) BY MOUTH DAILY  . [DISCONTINUED] MYRBETRIQ 25 MG TB24 tablet Take 25 mg by mouth daily.     Allergies:   Statins and Gabapentin   Social History   Socioeconomic History  . Marital status: Widowed    Spouse name: Not on file  . Number of children: Not on file  . Years of education: Not on file  . Highest education level: Not on file  Occupational History  . Not on file  Tobacco Use  . Smoking status: Passive Smoke Exposure - Never Smoker  . Smokeless tobacco: Never Used  Substance and Sexual Activity  . Alcohol use: Yes    Alcohol/week: 0.0 standard drinks    Comment: very seldom  . Drug use: No  . Sexual activity: Not Currently  Other Topics Concern  . Not on file  Social History Narrative   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      She likes to read and she takes classes at [employer]:   . Difficulty of Paying Living Expenses: Not on file  Food Insecurity:   . Worried About Charity fundraiser in the Last Year: Not on file  . Ran Out of Food in the Last Year: Not on file  Transportation Needs:   . Lack of Transportation (Medical): Not on file  . Lack of Transportation (Non-Medical): Not on file  Physical Activity:   . Days of Exercise per Week: Not on file  . Minutes of Exercise per Session: Not on file  Stress:   . Feeling of Stress : Not on file  Social Connections:   . Frequency of Communication with Friends and Family: Not on file  . Frequency of Social Gatherings with Friends and Family: Not on file  . Attends Religious Services: Not on file  . Active Member of Clubs or Organizations: Not on file  . Attends Archivist Meetings: Not on file  . Marital Status: Not on file     Family History: The patient's family history includes COPD in her father; Cancer in her father; Lung  cancer in her unknown relative.  ROS:   Please see the history of present illness.    All other systems reviewed and are negative.  EKGs/Labs/Other Studies Reviewed:    The following studies were reviewed today: Echo 10/05/2018: IMPRESSIONS    1. The left ventricle has hyperdynamic systolic function, with an ejection fraction of >65%. The cavity size was normal. There is moderately increased left ventricular wall thickness with severe focal basal hypertrophy. Indeterminate diastolic filling  due to E-A fusion. Indeterminate filling pressures.  2. The right ventricle  has normal systolic function. The cavity was normal. There is no increase in right ventricular wall thickness. Right ventricular systolic pressure is moderately elevated with an estimated pressure of 39.8 mmHg.  3. The mitral valve is abnormal. There is severe mitral annular calcification present. Mitral valve regurgitation is mild to moderate by color flow Doppler. Moderate-severe mitral valve stenosis. Mean diastolic MV gradient 9 mmHg at HR 67 bpm.  4. A 42mm a Medtronic CoreValve valve is present in the aortic position. Procedure Date: 2014 Echo findings shows no evidence of rocking or dehiscence of the aortic prosthesis. Mild aortic valve regurgitation, appears prosthetic but assessment limited  by prosthesis and calcium shadowing. Using an LVOT diameter of 2.1 cm, calculated valve area is approximately 2 cm2, though this may be overestimated due to increased flow velocity in LVOT.  5. Left atrial size was severely dilated.  6. The inferior vena cava was normal in size with <50% respiratory variability.  7. When compared to the prior study: 08/28/2017 - no significant change in biventricular function, [name] prosthesis function or regurgitation, or degree of mitral stenosis. Side by side comparison of images performed.  FINDINGS  Left Ventricle: The left ventricle has hyperdynamic systolic function, with an ejection fraction of  >65%. The cavity size was normal. There is moderately increased left ventricular wall thickness. Indeterminate diastolic filling due to E-A fusion.  Indeterminate filling pressures There is a small aneurismal area in the mid septum apparently due to a contained wire perforation during [name] . This aneurysmal area has not changed from previous echo.  Right Ventricle: The right ventricle has normal systolic function. The cavity was normal. There is no increase in right ventricular wall thickness. Right ventricular systolic pressure is moderately elevated with an estimated pressure of 39.8 mmHg.  Left Atrium: Left atrial size was severely dilated.  Right Atrium: Right atrial size was normal in size. Right atrial pressure is estimated at 8 mmHg.  Interatrial Septum: No atrial level shunt detected by color flow Doppler.  Pericardium: There is no evidence of pericardial effusion.  Mitral Valve: The mitral valve is abnormal. There is severe mitral annular calcification present. Mitral valve regurgitation is mild to moderate by color flow Doppler. Moderate-severe mitral valve stenosis.  Tricuspid Valve: The tricuspid valve is normal in structure. Tricuspid valve regurgitation is trivial by color flow Doppler.  Aortic Valve: The aortic valve has been repaired/replaced Aortic valve regurgitation is mild by color flow Doppler. A 58mm Medtronic CoreValve valve is present in the aortic position. Procedure Date: 2014 Echo findings shows no evidence of rocking or  dehiscence of the aortic prosthesis.  Pulmonic Valve: The pulmonic valve was grossly normal. Pulmonic valve regurgitation is trivial by color flow Doppler.  Aorta: The aorta was not well visualized.    Venous: The inferior vena cava is normal in size with less than 50% respiratory variability.  Compared to previous exam: 08/28/2017 - no significant change in biventricular function, [name] prosthesis function or regurgitation, or degree  of mitral stenosis. Side by side comparison of images performed.    +--------------+--------++ LEFT VENTRICLE         +----------------+---------++ +--------------+--------++ Diastology                PLAX 2D                +----------------+---------++ +--------------+--------++ LV e' lateral:  4.82 cm/s LVIDd:        3.78 cm  +----------------+---------++ +--------------+--------++ LV E/e' lateral:34.0  LVIDs:        2.55 cm  +----------------+---------++ +--------------+--------++ LV e' medial:   4.03 cm/s LV PW:        1.45 cm  +----------------+---------++ +--------------+--------++ LV E/e' medial: 40.7      LV IVS:       1.64 cm  +----------------+---------++ +--------------+--------++ LVOT diam:    2.10 cm  +--------------+--------++ LV SV:        38 ml    +--------------+--------++ LV SV Index:  21.45    +--------------+--------++ LVOT Area:    3.46 cm +--------------+--------++                        +--------------+--------++  +---------------+----------++ RIGHT VENTRICLE           +---------------+----------++ RV S prime:    14.10 cm/s +---------------+----------++ TAPSE (M-mode):1.9 cm     +---------------+----------++ RVSP:          39.8 mmHg  +---------------+----------++  +---------------+--------++-----------++ LEFT ATRIUM            Index       +---------------+--------++-----------++ LA diam:       5.50 cm 3.30 cm/m  +---------------+--------++-----------++ LA Vol (A2C):  127.0 ml76.11 ml/m +---------------+--------++-----------++ LA Vol (A4C):  106.0 ml63.53 ml/m +---------------+--------++-----------++ LA Biplane Vol:116.0 ml69.52 ml/m +---------------+--------++-----------++ +------------+---------++-----------++ RIGHT ATRIUM         Index       +------------+---------++-----------++ RA Pressure:8.00 mmHg             +------------+---------++-----------++ RA Area:    12.40 cm            +------------+---------++-----------++ RA Volume:  26.80 ml 16.06 ml/m +------------+---------++-----------++  +------------------+------------++ AORTIC VALVE                   +------------------+------------++ AV Area (Vmax):   1.89 cm     +------------------+------------++ AV Area (Vmean):  1.97 cm     +------------------+------------++ AV Area (VTI):    2.05 cm     +------------------+------------++ AV Vmax:          245.00 cm/s  +------------------+------------++ AV Vmean:         157.500 cm/s +------------------+------------++ AV VTI:           0.558 m      +------------------+------------++ AV Peak Grad:     24.0 mmHg    +------------------+------------++ AV Mean Grad:     10.0 mmHg    +------------------+------------++ LVOT Vmax:        133.50 cm/s  +------------------+------------++ LVOT Vmean:       89.800 cm/s  +------------------+------------++ LVOT VTI:         0.330 m      +------------------+------------++ LVOT/AV VTI ratio:0.59         +------------------+------------++ AR PHT:           378 msec     +------------------+------------++   +-------------+-------++ AORTA                +-------------+-------++ Ao Root diam:2.80 cm +-------------+-------++ Ao Asc diam: 3.00 cm +-------------+-------++  +--------------+----------++   +---------------+-----------++ MITRAL VALVE               TRICUSPID VALVE            +--------------+----------++   +---------------+-----------++ MV Area (PHT):2.75         TR Peak grad:  31.8 mmHg                 cm          +---------------+-----------++ +--------------+----------++  TR Vmax:       282.00 cm/s MV Peak grad: 30.0 mmHg    +---------------+-----------++ +--------------+----------++   Estimated RAP: 8.00 mmHg   MV Mean  grad: 9.0 mmHg     +---------------+-----------++ +--------------+----------++   RVSP:          39.8 mmHg   MV Vmax:      2.74 m/s     +---------------+-----------++ +--------------+----------++ MV Vmean:     145.0 cm/s   +--------------+-------+ +--------------+----------++   SHUNTS                MV VTI:       0.69 m       +--------------+-------+ +--------------+----------++   Systemic VTI: 0.33 m  MV PHT:       80 msec      +--------------+-------+ +--------------+----------++   Systemic Diam:2.10 cm MV Decel Time:243 msec     +--------------+-------+ +--------------+----------++ +---------------+-----------++ MR Peak grad:  133.2 mmHg  +---------------+-----------++ MR Mean grad:  86.0 mmHg   +---------------+-----------++ MR Vmax:       577.00 cm/s +---------------+-----------++ MR Vmean:      438.0 cm/s  +---------------+-----------++ MR PISA:       2.26 cm    +---------------+-----------++ MR PISA Radius:0.60 cm     +---------------+-----------++ +--------------+-----------++ MV E velocity:164.00 cm/s +--------------+-----------++ MV A velocity:223.00 cm/s +--------------+-----------++ MV E/A ratio: 0.74        +--------------+-----------++   EKG:  EKG is not ordered today.    Recent Labs: 11/26/2018: ALT 10; BUN 37; Creatinine, Ser 1.36; Hemoglobin 11.3; Platelets 209.0; Potassium 4.5; Sodium 142; TSH 1.63  Recent Lipid Panel    Component Value Date/Time   CHOL 212 (H) 11/26/2018 1201   TRIG 208.0 (H) 11/26/2018 1201   HDL 39.50 11/26/2018 1201   CHOLHDL 5 11/26/2018 1201   VLDL 41.6 (H) 11/26/2018 1201   LDLCALC 134 (H) 08/27/2017 1115   LDLDIRECT 143.0 11/26/2018 1201    Physical Exam:    VS:  BP 130/62   Pulse 90   Ht 4\' 11"  (1.499 m)   Wt 160 lb (72.6 kg)   SpO2 91%   BMI 32.32 kg/m     Wt Readings from Last 3 Encounters:  04/19/19 160 lb (72.6 kg)  11/26/18 160 lb (72.6  kg)  10/21/18 161 lb (73 kg)     GEN: Well nourished, well developed pleasant elderly woman in no acute distress HEENT: Normal NECK: JVP moderately elevated; No carotid bruits LYMPHATICS: No lymphadenopathy CARDIAC: RRR, 2/6 systolic murmur at the RUSB, no diastolic murmur RESPIRATORY:  Clear to auscultation without rales, wheezing or rhonchi  ABDOMEN: Soft, non-tender, non-distended MUSCULOSKELETAL:  1+ bilateral pretibial edema; No deformity  SKIN: Warm and dry NEUROLOGIC:  Alert and oriented x 3 PSYCHIATRIC:  Normal affect   ASSESSMENT:    1. Chronic diastolic CHF (congestive heart failure) ([place])   2. Aortic valve disease   3. Essential hypertension    PLAN:    In order of problems listed above:  1. The patient has signs and symptoms of heart failure today with some volume overload on exam and New York Heart Association functional class IIIb symptoms.  I recommended that she increase furosemide to 40 mg x 1 tomorrow, then 20 mg daily thereafter.  This will be a 50% dose increase from her baseline.  She has some concerns about the impact on her urinary incontinence, but she is willing to give it a try.  We will check a metabolic panel  in 2 to 3 weeks.  I will plan to see her back in follow-up in 6 months unless symptoms change. 2. Essentially normal [name] valve function at the time of her last echo.  We will repeat when she returns for follow-up in 6 months. 3. Blood pressure has been elevated.  She will stop taking Myrbetriq in case this is causing the BP spike. I reviewed drug labeling and HTN occurs >10% with Myrbetriq. If BP remains elevated she will call back and we will make further medication adjustments.   Medication Adjustments/Labs and Tests Ordered: Current medicines are reviewed at length with the patient today.  Concerns regarding medicines are outlined above.  Orders Placed This Encounter  Procedures  . Basic metabolic panel  . ECHOCARDIOGRAM COMPLETE   Meds  ordered this encounter  Medications  . furosemide (LASIX) 20 MG tablet    Sig: Take 1 tablet (20 mg total) by mouth daily.    Dispense:  90 tablet    Refill:  3    Patient Instructions  Medication Instructions:  1) STOP MYRBETRIQ 2) TAKE LASIX 40 mg tomorrow only, then decrease to 20 mg daily  Labwork: Your provider recommends that you return for lab work in: 2 weeks.  Follow-Up: You will be called to arrange your 6 month echo and office visit with Dr. Burt Knack when his July 2021 schedule is available.    Signed, Sara Mocha, MD  04/20/2019 1:31 PM    [place]

## 2019-04-21 DIAGNOSIS — M5416 Radiculopathy, lumbar region: Secondary | ICD-10-CM | POA: Diagnosis not present

## 2019-04-28 DIAGNOSIS — M5416 Radiculopathy, lumbar region: Secondary | ICD-10-CM | POA: Diagnosis not present

## 2019-05-03 ENCOUNTER — Other Ambulatory Visit: Payer: Medicare Other | Admitting: *Deleted

## 2019-05-03 ENCOUNTER — Other Ambulatory Visit: Payer: Self-pay

## 2019-05-03 DIAGNOSIS — I359 Nonrheumatic aortic valve disorder, unspecified: Secondary | ICD-10-CM

## 2019-05-03 DIAGNOSIS — I5032 Chronic diastolic (congestive) heart failure: Secondary | ICD-10-CM

## 2019-05-04 LAB — BASIC METABOLIC PANEL
BUN/Creatinine Ratio: 25 (ref 12–28)
BUN: 33 mg/dL (ref 10–36)
CO2: 26 mmol/L (ref 20–29)
Calcium: 9.2 mg/dL (ref 8.7–10.3)
Chloride: 106 mmol/L (ref 96–106)
Creatinine, Ser: 1.33 mg/dL — ABNORMAL HIGH (ref 0.57–1.00)
GFR calc Af Amer: 40 mL/min/{1.73_m2} — ABNORMAL LOW (ref >59)
GFR calc non Af Amer: 34 mL/min/{1.73_m2} — ABNORMAL LOW (ref >59)
Glucose: 76 mg/dL (ref 65–99)
Potassium: 4.6 mmol/L (ref 3.5–5.2)
Sodium: 146 mmol/L — ABNORMAL HIGH (ref 134–144)

## 2019-05-05 ENCOUNTER — Telehealth: Payer: Self-pay | Admitting: Cardiovascular Disease

## 2019-05-05 DIAGNOSIS — M5416 Radiculopathy, lumbar region: Secondary | ICD-10-CM | POA: Diagnosis not present

## 2019-05-05 NOTE — Telephone Encounter (Signed)
Reviewed labs with patient. Copy placed in mail. She was grateful for assistance.

## 2019-05-05 NOTE — Telephone Encounter (Signed)
Patient would like a copy of her lab work taken on Monday 05/03/2019, or call her with the results.

## 2019-05-13 DIAGNOSIS — E119 Type 2 diabetes mellitus without complications: Secondary | ICD-10-CM | POA: Diagnosis not present

## 2019-05-13 DIAGNOSIS — Z961 Presence of intraocular lens: Secondary | ICD-10-CM | POA: Diagnosis not present

## 2019-05-13 DIAGNOSIS — H353132 Nonexudative age-related macular degeneration, bilateral, intermediate dry stage: Secondary | ICD-10-CM | POA: Diagnosis not present

## 2019-05-13 DIAGNOSIS — H401132 Primary open-angle glaucoma, bilateral, moderate stage: Secondary | ICD-10-CM | POA: Diagnosis not present

## 2019-05-13 LAB — HM DIABETES EYE EXAM

## 2019-05-19 ENCOUNTER — Encounter: Payer: Self-pay | Admitting: Adult Health

## 2019-05-19 DIAGNOSIS — M5416 Radiculopathy, lumbar region: Secondary | ICD-10-CM | POA: Diagnosis not present

## 2019-05-31 ENCOUNTER — Encounter (INDEPENDENT_AMBULATORY_CARE_PROVIDER_SITE_OTHER): Payer: Self-pay | Admitting: Otolaryngology

## 2019-05-31 ENCOUNTER — Ambulatory Visit (INDEPENDENT_AMBULATORY_CARE_PROVIDER_SITE_OTHER): Payer: Medicare Other | Admitting: Otolaryngology

## 2019-05-31 ENCOUNTER — Other Ambulatory Visit: Payer: Self-pay

## 2019-05-31 VITALS — Temp 97.5°F

## 2019-05-31 DIAGNOSIS — I25119 Atherosclerotic heart disease of native coronary artery with unspecified angina pectoris: Secondary | ICD-10-CM | POA: Diagnosis not present

## 2019-05-31 DIAGNOSIS — H6123 Impacted cerumen, bilateral: Secondary | ICD-10-CM | POA: Diagnosis not present

## 2019-05-31 NOTE — Progress Notes (Signed)
HPI: Sara Garza is a 86 y.o. female who presents for evaluation of cerumen buildup blocking her hearing aids.  She is referred by our audiologist to have the ears cleaned..  Past Medical History:  Diagnosis Date  . Arthritis   . CAD (coronary artery disease)   . Carotid stenosis, left    50-60% stable  . Diabetes mellitus   . Glaucoma   . Hyperlipidemia   . Hypertension   . Leg pain    ABIs 2/18: normal bilaterally.  . Osteoporosis   . Valvular heart disease    Aortic Stenosis s/p [name] 2014 // Mitral stenosis // Echo 09/2018: EF > 65, mod LVH, severe focal basal hypertrophy, RVSP 39.8, mild to mod MR, mod to severe MS (mean 9), AVR with mild AI, severe LAE (similar to prior echo)   Past Surgical History:  Procedure Laterality Date  . ANOMALOUS PULMONARY VENOUS RETURN REPAIR, TOTAL    . APPENDECTOMY    . CARDIAC VALVE REPLACEMENT  04/29/2012  . Cataracts Bilateral   . CESAREAN SECTION    . FOOT SURGERY     [name]  . HIP ARTHROPLASTY Right 07/31/2013   Procedure: ARTHROPLASTY BIPOLAR HIP;  Surgeon: [name], MD;  Location: [facility];  Service: Orthopedics;  Laterality: Right;  . PERCUTANEOUS CORONARY STENT INTERVENTION (PCI-S) N/A 01/02/2012   Procedure: PERCUTANEOUS CORONARY STENT INTERVENTION (PCI-S);  Surgeon: Sherren Mocha, MD;  Location: [facility] CATH LAB;  Service: Cardiovascular;  Laterality: N/A;   Social History   Socioeconomic History  . Marital status: Widowed    Spouse name: Not on file  . Number of children: Not on file  . Years of education: Not on file  . Highest education level: Not on file  Occupational History  . Not on file  Tobacco Use  . Smoking status: Passive Smoke Exposure - Never Smoker  . Smokeless tobacco: Never Used  Substance and Sexual Activity  . Alcohol use: Yes    Alcohol/week: 0.0 standard drinks    Comment: very seldom  . Drug use: No  . Sexual activity: Not Currently  Other Topics Concern  . Not on file  Social History Narrative    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      She likes to read and she takes classes at [employer]:   . Difficulty of Paying Living Expenses: Not on file  Food Insecurity:   . Worried About Charity fundraiser in the Last Year: Not on file  . Ran Out of Food in the Last Year: Not on file  Transportation Needs:   . Lack of Transportation (Medical): Not on file  . Lack of Transportation (Non-Medical): Not on file  Physical Activity:   . Days of Exercise per Week: Not on file  . Minutes of Exercise per Session: Not on file  Stress:   . Feeling of Stress : Not on file  Social Connections:   . Frequency of Communication with Friends and Family: Not on file  . Frequency of Social Gatherings with Friends and Family: Not on file  . Attends Religious Services: Not on file  . Active Member of Clubs or Organizations: Not on file  . Attends Archivist Meetings: Not on file  . Marital Status: Not on file   Family History  Problem Relation Age of Onset  . COPD  Father   . Cancer Father        lung cancer  . Lung cancer Unknown    Allergies  Allergen Reactions  . Statins Other (See Comments)    Muscle aches  . Gabapentin Other (See Comments)    SOMNOLENCE   Prior to Admission medications   Medication Sig Start Date End Date Taking? Authorizing Provider  amLODipine (NORVASC) 5 MG tablet Take 1.5 tablets (7.5 mg total) by mouth daily. 02/24/19  Yes Nafziger, [name], NP  amoxicillin (AMOXIL) 500 MG capsule TK 4 CAPSULES PO 1 HOUR PRIOR TO DENTAL PROCEDURES 02/02/19  Yes Sherren Mocha, MD  aspirin EC 81 MG tablet Take 1 tablet (81 mg total) by mouth daily. Start after lovenox injections are done after 2 weeks 08/03/13  Yes Rai, Ripudeep K, MD  bimatoprost (LUMIGAN) 0.01 % SOLN Place 1 drop into both eyes at bedtime.    Yes [provider]  Calcium Carb-Cholecalciferol (CALCIUM  500 +D) 500-400 MG-UNIT TABS Take 1 tablet by mouth daily.    Yes [provider]  carvedilol (COREG) 3.125 MG tablet TAKE 1 TABLET(3.125 MG) BY MOUTH TWICE DAILY WITH A MEAL 03/24/19  Yes Nafziger, [name], NP  ezetimibe (ZETIA) 10 MG tablet TAKE 1 TABLET(10 MG) BY MOUTH DAILY 02/19/19  Yes Sherren Mocha, MD  furosemide (LASIX) 20 MG tablet Take 1 tablet (20 mg total) by mouth daily. 04/19/19 04/13/20 Yes Sherren Mocha, MD  isosorbide mononitrate (IMDUR) 60 MG 24 hr tablet TAKE 1 TABLET(60 MG) BY MOUTH DAILY 04/05/19  Yes Weaver, Scott T, PA-C  losartan (COZAAR) 25 MG tablet TAKE 1 TABLET(25 MG) BY MOUTH DAILY 12/23/18  Yes Nafziger, [name], NP  Multiple Vitamin (MULTIVITAMIN) tablet Take 1 tablet by mouth daily.     Yes [provider]     Positive ROS: Otherwise negative  All other systems have been reviewed and were otherwise negative with the exception of those mentioned in the HPI and as above.  Physical Exam: Constitutional: Alert, well-appearing, no acute distress Ears: External ears without lesions or tenderness. Ear canals are slightly smaller than average bilaterally.  She had moderate wax buildup on the right side that was cleaned with forceps.  She had minimal wax buildup on the left side that was cleaned with a curette.  TMs were clear bilaterally.. Nasal: External nose without lesions. Clear nasal passages Oral: Oropharynx clear. Neck: No palpable adenopathy or masses Respiratory: Breathing comfortably  Skin: No facial/neck lesions or rash noted.  Cerumen impaction removal  Date/Time: 05/31/2019 4:01 PM Performed by: Rozetta Nunnery, MD Authorized by: Rozetta Nunnery, MD   Consent:    Consent obtained:  Verbal   Consent given by:  Patient   Risks discussed:  Pain and bleeding Procedure details:    Location:  L ear and R ear   Procedure type: curette and forceps   Post-procedure details:    Inspection:  TM intact and canal normal   Hearing  quality:  Improved   Patient tolerance of procedure:  Tolerated well, no immediate complications Comments:     TMs are clear bilaterally    Assessment: Cerumen buildup  Plan: This was cleaned in the office.  She will follow-up as needed  Radene Journey, MD

## 2019-06-17 ENCOUNTER — Ambulatory Visit: Payer: Medicare Other | Admitting: Cardiovascular Disease

## 2019-07-04 ENCOUNTER — Other Ambulatory Visit: Payer: Self-pay | Admitting: Adult Health

## 2019-07-06 NOTE — Telephone Encounter (Signed)
Sent to the pharmacy by e-scribe. 

## 2019-07-09 DIAGNOSIS — M545 Low back pain: Secondary | ICD-10-CM | POA: Diagnosis not present

## 2019-07-09 DIAGNOSIS — M9902 Segmental and somatic dysfunction of thoracic region: Secondary | ICD-10-CM | POA: Diagnosis not present

## 2019-07-09 DIAGNOSIS — M9905 Segmental and somatic dysfunction of pelvic region: Secondary | ICD-10-CM | POA: Diagnosis not present

## 2019-07-09 DIAGNOSIS — M9903 Segmental and somatic dysfunction of lumbar region: Secondary | ICD-10-CM | POA: Diagnosis not present

## 2019-07-09 DIAGNOSIS — M542 Cervicalgia: Secondary | ICD-10-CM | POA: Diagnosis not present

## 2019-07-09 DIAGNOSIS — M546 Pain in thoracic spine: Secondary | ICD-10-CM | POA: Diagnosis not present

## 2019-07-09 DIAGNOSIS — M9901 Segmental and somatic dysfunction of cervical region: Secondary | ICD-10-CM | POA: Diagnosis not present

## 2019-07-15 DIAGNOSIS — M9902 Segmental and somatic dysfunction of thoracic region: Secondary | ICD-10-CM | POA: Diagnosis not present

## 2019-07-15 DIAGNOSIS — M9901 Segmental and somatic dysfunction of cervical region: Secondary | ICD-10-CM | POA: Diagnosis not present

## 2019-07-15 DIAGNOSIS — M9905 Segmental and somatic dysfunction of pelvic region: Secondary | ICD-10-CM | POA: Diagnosis not present

## 2019-07-15 DIAGNOSIS — M9903 Segmental and somatic dysfunction of lumbar region: Secondary | ICD-10-CM | POA: Diagnosis not present

## 2019-07-15 DIAGNOSIS — M546 Pain in thoracic spine: Secondary | ICD-10-CM | POA: Diagnosis not present

## 2019-07-15 DIAGNOSIS — M542 Cervicalgia: Secondary | ICD-10-CM | POA: Diagnosis not present

## 2019-07-19 ENCOUNTER — Telehealth: Payer: Self-pay | Admitting: Cardiovascular Disease

## 2019-07-19 NOTE — Telephone Encounter (Signed)
Not sure who called the pt. I will send call to primary card nurse.

## 2019-07-19 NOTE — Telephone Encounter (Signed)
Scheduled patient for 6 mo echo and office visit with [facility] 7/15. She was grateful for assistance.

## 2019-07-19 NOTE — Telephone Encounter (Signed)
Patient states she is returning a call. I did not see a note.

## 2019-07-21 DIAGNOSIS — M542 Cervicalgia: Secondary | ICD-10-CM | POA: Diagnosis not present

## 2019-07-21 DIAGNOSIS — M9903 Segmental and somatic dysfunction of lumbar region: Secondary | ICD-10-CM | POA: Diagnosis not present

## 2019-07-21 DIAGNOSIS — M9901 Segmental and somatic dysfunction of cervical region: Secondary | ICD-10-CM | POA: Diagnosis not present

## 2019-07-21 DIAGNOSIS — M9905 Segmental and somatic dysfunction of pelvic region: Secondary | ICD-10-CM | POA: Diagnosis not present

## 2019-07-21 DIAGNOSIS — M546 Pain in thoracic spine: Secondary | ICD-10-CM | POA: Diagnosis not present

## 2019-07-21 DIAGNOSIS — M9902 Segmental and somatic dysfunction of thoracic region: Secondary | ICD-10-CM | POA: Diagnosis not present

## 2019-07-28 DIAGNOSIS — M9902 Segmental and somatic dysfunction of thoracic region: Secondary | ICD-10-CM | POA: Diagnosis not present

## 2019-07-28 DIAGNOSIS — M546 Pain in thoracic spine: Secondary | ICD-10-CM | POA: Diagnosis not present

## 2019-07-28 DIAGNOSIS — M542 Cervicalgia: Secondary | ICD-10-CM | POA: Diagnosis not present

## 2019-07-28 DIAGNOSIS — M9901 Segmental and somatic dysfunction of cervical region: Secondary | ICD-10-CM | POA: Diagnosis not present

## 2019-07-28 DIAGNOSIS — M9905 Segmental and somatic dysfunction of pelvic region: Secondary | ICD-10-CM | POA: Diagnosis not present

## 2019-07-28 DIAGNOSIS — M9903 Segmental and somatic dysfunction of lumbar region: Secondary | ICD-10-CM | POA: Diagnosis not present

## 2019-08-05 DIAGNOSIS — M546 Pain in thoracic spine: Secondary | ICD-10-CM | POA: Diagnosis not present

## 2019-08-05 DIAGNOSIS — M9901 Segmental and somatic dysfunction of cervical region: Secondary | ICD-10-CM | POA: Diagnosis not present

## 2019-08-05 DIAGNOSIS — M542 Cervicalgia: Secondary | ICD-10-CM | POA: Diagnosis not present

## 2019-08-05 DIAGNOSIS — M9905 Segmental and somatic dysfunction of pelvic region: Secondary | ICD-10-CM | POA: Diagnosis not present

## 2019-08-05 DIAGNOSIS — M9903 Segmental and somatic dysfunction of lumbar region: Secondary | ICD-10-CM | POA: Diagnosis not present

## 2019-08-05 DIAGNOSIS — M9902 Segmental and somatic dysfunction of thoracic region: Secondary | ICD-10-CM | POA: Diagnosis not present

## 2019-08-18 ENCOUNTER — Telehealth: Payer: Self-pay | Admitting: Adult Health

## 2019-08-18 DIAGNOSIS — M9902 Segmental and somatic dysfunction of thoracic region: Secondary | ICD-10-CM | POA: Diagnosis not present

## 2019-08-18 DIAGNOSIS — M546 Pain in thoracic spine: Secondary | ICD-10-CM | POA: Diagnosis not present

## 2019-08-18 DIAGNOSIS — M542 Cervicalgia: Secondary | ICD-10-CM | POA: Diagnosis not present

## 2019-08-18 DIAGNOSIS — M9901 Segmental and somatic dysfunction of cervical region: Secondary | ICD-10-CM | POA: Diagnosis not present

## 2019-08-18 DIAGNOSIS — M9905 Segmental and somatic dysfunction of pelvic region: Secondary | ICD-10-CM | POA: Diagnosis not present

## 2019-08-18 DIAGNOSIS — M9903 Segmental and somatic dysfunction of lumbar region: Secondary | ICD-10-CM | POA: Diagnosis not present

## 2019-08-18 NOTE — Chronic Care Management (AMB) (Signed)
  Chronic Care Management   Note  08/18/2019 Name: Sara Garza MRN: [id number] DOB: 02/14/1924  Sara Garza is a 86 y.o. year old female who is a primary care patient of [name], NP. I reached out to [name] by phone today in response to a referral sent by Sara Garza PCP, [name], NP.   Sara Garza was given information about Chronic Care Management services today Sara:  1. CCM service includes personalized support from designated clinical staff supervised by her Garza, Sara Garza billed when office clinical staff spend 20 minutes or more in a month to coordinate care. 4. Only one practitioner may furnish and bill the service in a calendar month. 5. The patient may stop CCM services at any time (effective at the end of the month) by phone call to the office staff.   Patient agreed to services and verbal consent obtained.   Follow up plan:   [place]

## 2019-08-23 ENCOUNTER — Other Ambulatory Visit: Payer: Self-pay | Admitting: Adult Health

## 2019-08-25 DIAGNOSIS — M9901 Segmental and somatic dysfunction of cervical region: Secondary | ICD-10-CM | POA: Diagnosis not present

## 2019-08-25 DIAGNOSIS — M9905 Segmental and somatic dysfunction of pelvic region: Secondary | ICD-10-CM | POA: Diagnosis not present

## 2019-08-25 DIAGNOSIS — M542 Cervicalgia: Secondary | ICD-10-CM | POA: Diagnosis not present

## 2019-08-25 DIAGNOSIS — M9903 Segmental and somatic dysfunction of lumbar region: Secondary | ICD-10-CM | POA: Diagnosis not present

## 2019-08-25 DIAGNOSIS — M9902 Segmental and somatic dysfunction of thoracic region: Secondary | ICD-10-CM | POA: Diagnosis not present

## 2019-08-25 DIAGNOSIS — M546 Pain in thoracic spine: Secondary | ICD-10-CM | POA: Diagnosis not present

## 2019-09-01 DIAGNOSIS — M9905 Segmental and somatic dysfunction of pelvic region: Secondary | ICD-10-CM | POA: Diagnosis not present

## 2019-09-01 DIAGNOSIS — M542 Cervicalgia: Secondary | ICD-10-CM | POA: Diagnosis not present

## 2019-09-01 DIAGNOSIS — M9902 Segmental and somatic dysfunction of thoracic region: Secondary | ICD-10-CM | POA: Diagnosis not present

## 2019-09-01 DIAGNOSIS — M9901 Segmental and somatic dysfunction of cervical region: Secondary | ICD-10-CM | POA: Diagnosis not present

## 2019-09-01 DIAGNOSIS — M9903 Segmental and somatic dysfunction of lumbar region: Secondary | ICD-10-CM | POA: Diagnosis not present

## 2019-09-01 DIAGNOSIS — M546 Pain in thoracic spine: Secondary | ICD-10-CM | POA: Diagnosis not present

## 2019-09-07 ENCOUNTER — Other Ambulatory Visit: Payer: Self-pay

## 2019-09-07 ENCOUNTER — Encounter: Payer: Self-pay | Admitting: Adult Health

## 2019-09-07 ENCOUNTER — Ambulatory Visit (INDEPENDENT_AMBULATORY_CARE_PROVIDER_SITE_OTHER): Payer: Medicare Other | Admitting: Adult Health

## 2019-09-07 VITALS — BP 146/74 | Temp 99.2°F | Wt 157.0 lb

## 2019-09-07 DIAGNOSIS — I25119 Atherosclerotic heart disease of native coronary artery with unspecified angina pectoris: Secondary | ICD-10-CM

## 2019-09-07 DIAGNOSIS — I872 Venous insufficiency (chronic) (peripheral): Secondary | ICD-10-CM

## 2019-09-07 DIAGNOSIS — M542 Cervicalgia: Secondary | ICD-10-CM | POA: Diagnosis not present

## 2019-09-07 NOTE — Progress Notes (Signed)
Subjective:    Patient ID: Sara Garza, female    DOB: [birth year], 86 y.o.   MRN: [id number]  HPI Remarkable 86 year old female who  has a past medical history of Arthritis, CAD (coronary artery disease), Carotid stenosis, left, Diabetes mellitus, Glaucoma, Hyperlipidemia, Hypertension, Leg pain, Osteoporosis, and Valvular heart disease.  86 year old female who is being evaluated today for the concern of discoloration on her right leg.  She reports that discoloration has been present for multiple months at this point in time, may be longer.  She does have a history of lower extremity swelling [place] due to venous insufficiency.  She takes Lasix on days that she does not plan on leaving the [place].  Additionally, she complains of cervical neck pain with loss of range of motion.  She reports that she has been seeing a chiropractor and has done about 8 sessions with them.  Since seeing a chiropractor she has some improvement in her range of motion but it is not back to her baseline.  She does hear "clicking and popping" at times when she moves her neck.    Review of Systems See HPI   Past Medical History:  Diagnosis Date   Arthritis    CAD (coronary artery disease)    Carotid stenosis, left    50-60% stable   Diabetes mellitus    Glaucoma    Hyperlipidemia    Hypertension    Leg pain    ABIs 2/18: normal bilaterally.   Osteoporosis    Valvular heart disease    Aortic Stenosis s/p [name] 2014 // Mitral stenosis // Echo 09/2018: EF > 65, mod LVH, severe focal basal hypertrophy, RVSP 39.8, mild to mod MR, mod to severe MS (mean 9), AVR with mild AI, severe LAE (similar to prior echo)    Social History   Socioeconomic History   Marital status: Widowed    Spouse name: Not on file   Number of children: Not on file   Years of education: Not on file   Highest education level: Not on file  Occupational History   Not on file  Tobacco Use   Smoking status: Passive Smoke  Exposure - Never Smoker   Smokeless tobacco: Never Used  Substance and Sexual Activity   Alcohol use: Yes    Alcohol/week: 0.0 standard drinks    Comment: very seldom   Drug use: No   Sexual activity: Not Currently  Other Topics Concern   Not on file  Social History Narrative   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      She likes to read and she takes classes at [employer]:    Difficulty of Paying Living Expenses:   Food Insecurity:    Worried About Charity fundraiser in the Last Year:    Arboriculturist in the Last Year:   Transportation Needs:    Film/video editor (Medical):    Lack of Transportation (Non-Medical):   Physical Activity:    Days of Exercise per Week:    Minutes of Exercise per Session:   Stress:    Feeling of Stress :   Social Connections:    Frequency of Communication with Friends and Family:    Frequency of Social Gatherings with Friends and Family:    Attends Religious Services:    Active Member of Clubs  or Organizations:    Attends Music therapist:    Marital Status:   Intimate Partner Violence:    Fear of Current or Ex-Partner:    Emotionally Abused:    Physically Abused:    Sexually Abused:     Past Surgical History:  Procedure Laterality Date   ANOMALOUS PULMONARY VENOUS RETURN REPAIR, TOTAL     APPENDECTOMY     CARDIAC VALVE REPLACEMENT  04/29/2012   Cataracts Bilateral    CESAREAN SECTION     FOOT SURGERY     [name]   HIP ARTHROPLASTY Right 07/31/2013   Procedure: ARTHROPLASTY BIPOLAR HIP;  Surgeon: [name], MD;  Location: [facility];  Service: Orthopedics;  Laterality: Right;   PERCUTANEOUS CORONARY STENT INTERVENTION (PCI-S) N/A 01/02/2012   Procedure: PERCUTANEOUS CORONARY STENT INTERVENTION (PCI-S);  Surgeon: Sherren Mocha, MD;  Location: [facility] CATH LAB;  Service: Cardiovascular;   Laterality: N/A;    Family History  Problem Relation Age of Onset   COPD Father    Cancer Father        lung cancer   Lung cancer Unknown     Allergies  Allergen Reactions   Statins Other (See Comments)    Muscle aches   Gabapentin Other (See Comments)    SOMNOLENCE    Current Outpatient Medications on File Prior to Visit  Medication Sig Dispense Refill   amLODipine (NORVASC) 5 MG tablet TAKE 1 AND 1/2 TABLETS(7.5 MG) BY MOUTH DAILY 135 tablet 0   aspirin EC 81 MG tablet Take 1 tablet (81 mg total) by mouth daily. Start after lovenox injections are done after 2 weeks     bimatoprost (LUMIGAN) 0.01 % SOLN Place 1 drop into both eyes at bedtime.      Calcium Carb-Cholecalciferol (CALCIUM 500 +D) 500-400 MG-UNIT TABS Take 1 tablet by mouth daily.      carvedilol (COREG) 3.125 MG tablet TAKE 1 TABLET(3.125 MG) BY MOUTH TWICE DAILY WITH A MEAL 180 tablet 1   ezetimibe (ZETIA) 10 MG tablet TAKE 1 TABLET(10 MG) BY MOUTH DAILY 30 tablet 7   furosemide (LASIX) 20 MG tablet Take 1 tablet (20 mg total) by mouth daily. 90 tablet 3   isosorbide mononitrate (IMDUR) 60 MG 24 hr tablet TAKE 1 TABLET(60 MG) BY MOUTH DAILY 90 tablet 3   losartan (COZAAR) 25 MG tablet TAKE 1 TABLET(25 MG) BY MOUTH DAILY 90 tablet 1   Multiple Vitamin (MULTIVITAMIN) tablet Take 1 tablet by mouth daily.       amoxicillin (AMOXIL) 500 MG capsule TK 4 CAPSULES PO 1 HOUR PRIOR TO DENTAL PROCEDURES (Patient not taking: Reported on 09/07/2019) 12 capsule 4   No current facility-administered medications on file prior to visit.    BP (!) 146/74    Temp 99.2 F (37.3 C) (Temporal)    Wt 157 lb (71.2 kg)    BMI 31.71 kg/m       Objective:   Physical Exam Vitals and nursing note reviewed.  Constitutional:      Appearance: Normal appearance.  Cardiovascular:     Rate and Rhythm: Normal rate and regular rhythm.     Pulses: Normal pulses.     Heart sounds: Normal heart sounds.  Pulmonary:     Effort:  Pulmonary effort is normal.     Breath sounds: Normal breath sounds.  Musculoskeletal:        General: Tenderness present.     Right lower leg: Edema present.     Left lower  leg: Edema present.     Comments: She does have some mild tenderness with palpation to the cervical spine as well as along bilateral trapezius.  Skin:    General: Skin is warm and dry.     Capillary Refill: Capillary refill takes less than 2 seconds.     Findings: No erythema.     Comments: Mild discoloration noted to right lower leg. No redness or warmth present   Neurological:     General: No focal deficit present.     Mental Status: She is alert and oriented to person, place, and time.  Psychiatric:        Mood and Affect: Mood normal.        Behavior: Behavior normal.        Thought Content: Thought content normal.        Judgment: Judgment normal.       Assessment & Plan:  1. Venous stasis dermatitis of right lower extremity -Does not appear to be cellulitic in nature.  Very mild venous stasis discoloration.  Advised to continue to use Lasix and can start using compression socks.  2. Cervical spine pain -History of osteoarthritis as well as stenosis, likely cause of her cervical spine pain as well as muscular.  Will check x-ray, but cannot use NSAIDs due to chronic kidney disease.  She might benefit from massage therapy - DG Cervical Spine Complete; Future   [name], NP

## 2019-09-07 NOTE — Patient Instructions (Addendum)
It was great seeing you today   The discoloration on your legs does not appear as infection but more chronic discoloration from the swelling, this is called venous stasis dermatitis. Continue to take lasix  Please schedule your physical after August 27th.   I will follow up with you regarding your xray - go to the [place] office for this

## 2019-09-13 DIAGNOSIS — M9902 Segmental and somatic dysfunction of thoracic region: Secondary | ICD-10-CM | POA: Diagnosis not present

## 2019-09-13 DIAGNOSIS — M9905 Segmental and somatic dysfunction of pelvic region: Secondary | ICD-10-CM | POA: Diagnosis not present

## 2019-09-13 DIAGNOSIS — M546 Pain in thoracic spine: Secondary | ICD-10-CM | POA: Diagnosis not present

## 2019-09-13 DIAGNOSIS — M9901 Segmental and somatic dysfunction of cervical region: Secondary | ICD-10-CM | POA: Diagnosis not present

## 2019-09-13 DIAGNOSIS — M542 Cervicalgia: Secondary | ICD-10-CM | POA: Diagnosis not present

## 2019-09-13 DIAGNOSIS — M9903 Segmental and somatic dysfunction of lumbar region: Secondary | ICD-10-CM | POA: Diagnosis not present

## 2019-09-14 ENCOUNTER — Other Ambulatory Visit (HOSPITAL_COMMUNITY): Payer: Medicare Other

## 2019-09-16 ENCOUNTER — Ambulatory Visit (INDEPENDENT_AMBULATORY_CARE_PROVIDER_SITE_OTHER)
Admission: RE | Admit: 2019-09-16 | Discharge: 2019-09-16 | Disposition: A | Discharge status: Home / Self Care | Payer: Medicare Other | Source: Ambulatory Visit | Attending: Adult Health | Admitting: Adult Health

## 2019-09-16 ENCOUNTER — Other Ambulatory Visit: Payer: Self-pay | Admitting: Family Medicine

## 2019-09-16 ENCOUNTER — Other Ambulatory Visit: Payer: Self-pay

## 2019-09-16 DIAGNOSIS — M542 Cervicalgia: Secondary | ICD-10-CM

## 2019-09-16 MED ORDER — CARVEDILOL 3.125 MG PO TABS: ORAL_TABLET | ORAL | 1 refills | Status: DC

## 2019-09-20 ENCOUNTER — Telehealth: Payer: Self-pay | Admitting: Adult Health

## 2019-09-20 NOTE — Chronic Care Management (AMB) (Deleted)
Chronic Care Management Pharmacy  Name: Sara Garza  MRN: [id number] DOB: 1923-09-07  Chief Complaint/ HPI  Sara Garza,  86 y.o. , female presents for their {Initial/Follow-up:3041532} CCM visit with the clinical pharmacist {CHL HP Upstream Pharm visit KDTO:[id number]}.  PCP : [name], NP  Their chronic conditions include: HTN, dyslipidemia, CKD stage 3, CHF  Office Visits: 09/07/19 OV - venous statis dermatitis of right lower extremity. Continue lasix and start using compression socks. Cervical spine pain. Recommended massage therapy.  Consult Visit: None  Medications: Outpatient Encounter Medications as of 09/28/2019  Medication Sig  . amLODipine (NORVASC) 5 MG tablet TAKE 1 AND 1/2 TABLETS(7.5 MG) BY MOUTH DAILY  . amoxicillin (AMOXIL) 500 MG capsule TK 4 CAPSULES PO 1 HOUR PRIOR TO DENTAL PROCEDURES (Patient not taking: Reported on 09/07/2019)  . aspirin EC 81 MG tablet Take 1 tablet (81 mg total) by mouth daily. Start after lovenox injections are done after 2 weeks  . bimatoprost (LUMIGAN) 0.01 % SOLN Place 1 drop into both eyes at bedtime.   . Calcium Carb-Cholecalciferol (CALCIUM 500 +D) 500-400 MG-UNIT TABS Take 1 tablet by mouth daily.   . carvedilol (COREG) 3.125 MG tablet TAKE 1 TABLET(3.125 MG) BY MOUTH TWICE DAILY WITH A MEAL  . ezetimibe (ZETIA) 10 MG tablet TAKE 1 TABLET(10 MG) BY MOUTH DAILY  . furosemide (LASIX) 20 MG tablet Take 1 tablet (20 mg total) by mouth daily.  . isosorbide mononitrate (IMDUR) 60 MG 24 hr tablet TAKE 1 TABLET(60 MG) BY MOUTH DAILY  . losartan (COZAAR) 25 MG tablet TAKE 1 TABLET(25 MG) BY MOUTH DAILY  . Multiple Vitamin (MULTIVITAMIN) tablet Take 1 tablet by mouth daily.     No facility-administered encounter medications on file as of 09/28/2019.    Current Diagnosis/Assessment:  Goals Addressed   None     Heart Failure   Type: Diastolic  Last ejection fraction: *** NYHA Class: {CHL HP Upstream Pharm NYHA  Class:[id number]} AHA HF Stage: {CHL HP Upstream Pharm AHA HF Stage:[phone]}  Patient has failed these meds in past: amlodipine/valsartan/hctz Patient is currently {CHL Controlled/[phone]} on the following medications:   Amlodipine 5 mg 1.5 tablet daily  Furosemide 20 mg 1 tablet daily  Isosorbide mononitrate 60 mg 1 tablet daily  Losartan 25 mg 1 tablet daily  Carvedilol 3.125 mg 1 tablet twice daily with a meal  We discussed {CHL HP Upstream Pharmacy discussion:[phone]}  Plan  Continue {CHL HP Upstream Pharmacy Plans:[phone]}    Hypertension   Office blood pressures are  BP Readings from Last 3 Encounters:  09/07/19 (!) 146/74  04/19/19 130/62  11/26/18 124/70    Patient has failed these meds in the past: amlodipine/valsartan/hctz Patient is currently {CHL Controlled/[phone]} on the following medications:  . Amlodipine 5 mg 1.5 tablet daily . Carvedilol 3.125 mg 1 tablet twice daily with a meal . Furosemide 20 mg 1 tablet daily . Isosorbide mononitrate 60 mg 1 tablet once daily . Losartan 25 mg 1 tablet once daily  Patient checks BP at home {CHL HP BP Monitoring [phone]}  Patient home BP readings are ranging: ***  We discussed {CHL HP Upstream Pharmacy discussion:[phone]}  Plan  Continue {CHL HP Upstream Pharmacy IPJAS:[id number]}     Hyperlipidemia   Lipid Panel     Component Value Date/Time   CHOL 212 (H) 11/26/2018 1201   TRIG 208.0 (H) 11/26/2018 1201   HDL 39.50 11/26/2018 1201   CHOLHDL 5 11/26/2018 1201   VLDL 41.6 (H) 11/26/2018 1201  LDLCALC 134 (H) 08/27/2017 1115   LDLDIRECT 143.0 11/26/2018 1201     The ASCVD Risk score Mikey Bussing DC Jr., et al., 2013) failed to calculate for the following reasons:   The 2013 ASCVD risk score is only valid for ages 39 to 23   Patient has failed these meds in past: *** Patient is currently {CHL Controlled/Uncontrolled:[phone]} on the following  medications:  . Ezetimibe 10 mg 1 tablet daily  We discussed:  {CHL HP Upstream Pharmacy discussion:[phone]}  Plan  Continue {CHL HP Upstream Pharmacy [phone]}   Glaucoma   Patient has failed these meds in past: *** Patient is currently {CHL Controlled/Uncontrolled:[phone]} on the following medications:  . bimatoprost 0.01% solution 1 drop into both eyes at bedtime  We discussed:  ***  Plan  Continue {CHL HP Upstream Pharmacy BTDHR:[id number]}   OTCs/Health Maintenance   Patient is currently {CHL Controlled/Uncontrolled:[phone]} on the following medications: . Aspirin EC 81 mg 1 tablet daily . Calcium + Vit D 500/400 mg/unit 1 tablet daily . Amoxicillin 500 mg 4 capsules 1 hr prior to dental procedures . Multivitamin 1 tablet daily  We discussed:  ***  Plan  Continue {CHL HP Upstream Pharmacy WOEHO:[id number]}  Vaccines   Reviewed and discussed patient's vaccination history.    Immunization History  Administered Date(s) Administered  . Hepatitis A 10/09/2005  . Influenza Split 01/02/2011, 12/24/2011  . Influenza Whole 04/01/2005, 01/01/2007, 01/02/2008, 01/06/2009, 01/17/2010  . Influenza, High Dose Seasonal PF 01/04/2014, 01/06/2015, 01/09/2016, 01/07/2017, 12/25/2017  . Influenza,inj,Quad PF,6+ Mos 12/25/2012  . Pneumococcal Conjugate-13 02/22/2013  . Pneumococcal Polysaccharide-23 04/01/2005  . Td 04/01/2000  . Tetanus 07/16/2013  . Zoster 10/09/2005    Plan  Recommended patient receive *** vaccine in *** office/pharmacy.    Medication Management   Pharmacy/Benefits: Adherence:  Pt endorses ***% compliance  We discussed: ***  Plan  {US Pharmacy BCWU:[id number]}   Follow up: *** month phone visit   Geraldine Contras, PharmD Clinical Pharmacist [facility] [place] [phone]

## 2019-09-20 NOTE — Telephone Encounter (Signed)
Pt returning your call. Thanks

## 2019-09-21 NOTE — Telephone Encounter (Signed)
I have not called the pt. 

## 2019-09-26 ENCOUNTER — Other Ambulatory Visit: Payer: Self-pay | Admitting: Adult Health

## 2019-09-28 ENCOUNTER — Ambulatory Visit: Payer: Medicare Other

## 2019-09-28 ENCOUNTER — Telehealth: Payer: Self-pay | Admitting: *Deleted

## 2019-09-28 ENCOUNTER — Other Ambulatory Visit: Payer: Self-pay | Admitting: Adult Health

## 2019-09-28 DIAGNOSIS — E785 Hyperlipidemia, unspecified: Secondary | ICD-10-CM

## 2019-09-28 DIAGNOSIS — I2510 Atherosclerotic heart disease of native coronary artery without angina pectoris: Secondary | ICD-10-CM

## 2019-09-28 NOTE — Telephone Encounter (Signed)
Patient called at 8:30 to cancel 9 am appointment. Patient stated that her alarm didn't go off, she just woke up. Would like a call back to reschedule.

## 2019-09-29 NOTE — Telephone Encounter (Signed)
Hi Noel, see below. This is your patient.

## 2019-09-30 NOTE — Telephone Encounter (Signed)
I'll give a patient a call and have her rescheduled for this month. Thanks!

## 2019-10-05 ENCOUNTER — Telehealth: Payer: Self-pay | Admitting: Adult Health

## 2019-10-05 NOTE — Progress Notes (Signed)
  Chronic Care Management   Outreach Note  10/05/2019 Name: Sara Garza MRN: [id number] DOB: 1923/04/25  Referred by: [name], NP Reason for referral : No chief complaint on file.   An unsuccessful telephone outreach was attempted today. The patient was referred to the pharmacist for assistance with care management and care coordination.   Follow Up Plan:   Prathima Ghanta Upstream Scheduler

## 2019-10-14 ENCOUNTER — Encounter: Payer: Self-pay | Admitting: Cardiovascular Disease

## 2019-10-14 ENCOUNTER — Other Ambulatory Visit: Payer: Self-pay

## 2019-10-14 ENCOUNTER — Ambulatory Visit (INDEPENDENT_AMBULATORY_CARE_PROVIDER_SITE_OTHER): Payer: Medicare Other | Admitting: Cardiovascular Disease

## 2019-10-14 ENCOUNTER — Ambulatory Visit (HOSPITAL_COMMUNITY): Payer: Medicare Other | Attending: Cardiovascular Disease

## 2019-10-14 VITALS — BP 128/50 | HR 74 | Ht 59.0 in | Wt 157.8 lb

## 2019-10-14 DIAGNOSIS — I359 Nonrheumatic aortic valve disorder, unspecified: Secondary | ICD-10-CM

## 2019-10-14 DIAGNOSIS — I10 Essential (primary) hypertension: Secondary | ICD-10-CM

## 2019-10-14 DIAGNOSIS — I25119 Atherosclerotic heart disease of native coronary artery with unspecified angina pectoris: Secondary | ICD-10-CM | POA: Diagnosis not present

## 2019-10-14 DIAGNOSIS — I5032 Chronic diastolic (congestive) heart failure: Secondary | ICD-10-CM | POA: Diagnosis not present

## 2019-10-14 LAB — ECHOCARDIOGRAM COMPLETE
AR max vel: 1.14 cm2
AV Area VTI: 1.21 cm2
AV Area mean vel: 1.28 cm2
AV Mean grad: 13.7 mmHg
AV Peak grad: 26.8 mmHg
Ao pk vel: 2.59 m/s
Area-P 1/2: 1.92 cm2
MV M vel: 5.49 m/s
MV Peak grad: 120.6 mmHg
P 1/2 time: 357 msec
Radius: 1.1 cm
S' Lateral: 2.47 cm

## 2019-10-14 NOTE — Patient Instructions (Signed)

## 2019-10-14 NOTE — Progress Notes (Signed)
Cardiology Office Note:    Date:  10/15/2019   ID:  Sara Garza, DOB [birth year], MRN [id number]  PCP:  [name], NP  [facility] Cardiologist:  Sherren Mocha, MD  [place] Electrophysiologist:  None   Referring MD: [name], NP   Chief Complaint  Patient presents with  . Coronary Artery Disease  . Shortness of Breath    History of Present Illness:    Sara Garza is a 86 y.o. female with a hx of coronary artery disease, aortic stenosis, and chronic diastolic heart failure, presenting for follow-up evaluation.  She underwent for [name] for treatment of severe symptomatic aortic stenosis in 2014. She was treated with a 23 mm Medtronic Corevalve through the moderate risk SurTAVI aortic stenosis trial. She also is followed for moderate coronary artery disease which was evaluated with pressure wire analysis of the RCA and LCx demonstrating no hemodynamic evidence of flow-obstruction. 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  The patient is here alone today.  She reports stable symptoms of exertional dyspnea and leg swelling.  She denies orthopnea or PND.  She has rare episodes of chest pain with no significant change from previous.  No lightheadedness or syncope.  She has a lot of problems with urinary incontinence especially on days that she takes Lasix.  She prefers to take this on days that she stays within the home.  Past Medical History:  Diagnosis Date  . Arthritis   . CAD (coronary artery disease)   . Carotid stenosis, left    50-60% stable  . Diabetes mellitus   . Glaucoma   . Hyperlipidemia   . Hypertension   . Leg pain    ABIs 2/18: normal bilaterally.  . Osteoporosis   . Valvular heart disease    Aortic Stenosis s/p [name] 2014 // Mitral stenosis // Echo 09/2018: EF > 65, mod LVH, severe focal basal hypertrophy, RVSP 39.8, mild to mod MR, mod to severe  MS (mean 9), AVR with mild AI, severe LAE (similar to prior echo)    Past Surgical History:  Procedure Laterality Date  . ANOMALOUS PULMONARY VENOUS RETURN REPAIR, TOTAL    . APPENDECTOMY    . CARDIAC VALVE REPLACEMENT  04/29/2012  . Cataracts Bilateral   . CESAREAN SECTION    . FOOT SURGERY     [name]  . HIP ARTHROPLASTY Right 07/31/2013   Procedure: ARTHROPLASTY BIPOLAR HIP;  Surgeon: [name], MD;  Location: [facility];  Service: Orthopedics;  Laterality: Right;  . PERCUTANEOUS CORONARY STENT INTERVENTION (PCI-S) N/A 01/02/2012   Procedure: PERCUTANEOUS CORONARY STENT INTERVENTION (PCI-S);  Surgeon: Sherren Mocha, MD;  Location: [facility] CATH LAB;  Service: Cardiovascular;  Laterality: N/A;    Current Medications: Current Meds  Medication Sig  . amLODipine (NORVASC) 5 MG tablet TAKE 1 AND 1/2 TABLETS(7.5 MG) BY MOUTH DAILY  . amoxicillin (AMOXIL) 500 MG capsule TK 4 CAPSULES PO 1 HOUR PRIOR TO DENTAL PROCEDURES  . aspirin EC 81 MG tablet Take 1 tablet (81 mg total) by mouth daily. Start after lovenox injections are done after 2 weeks  . bimatoprost (LUMIGAN) 0.01 % SOLN Place 1 drop into both eyes at bedtime.   . Calcium Carb-Cholecalciferol (CALCIUM 500 +D) 500-400 MG-UNIT TABS Take 1 tablet by mouth daily.   . carvedilol (COREG) 3.125 MG tablet TAKE 1 TABLET(3.125 MG) BY MOUTH TWICE DAILY WITH A  MEAL  . ezetimibe (ZETIA) 10 MG tablet TAKE 1 TABLET(10 MG) BY MOUTH DAILY  . furosemide (LASIX) 20 MG tablet Take 1 tablet (20 mg total) by mouth daily.  . isosorbide mononitrate (IMDUR) 60 MG 24 hr tablet TAKE 1 TABLET(60 MG) BY MOUTH DAILY  . losartan (COZAAR) 25 MG tablet TAKE 1 TABLET(25 MG) BY MOUTH DAILY  . Multiple Vitamin (MULTIVITAMIN) tablet Take 1 tablet by mouth daily.       Allergies:   Statins and Gabapentin   Social History   Socioeconomic History  . Marital status: Widowed    Spouse name: Not on file  . Number of children: Not on file  . Years of education: Not on  file  . Highest education level: Not on file  Occupational History  . Not on file  Tobacco Use  . Smoking status: Passive Smoke Exposure - Never Smoker  . Smokeless tobacco: Never Used  Substance and Sexual Activity  . Alcohol use: Yes    Alcohol/week: 0.0 standard drinks    Comment: very seldom  . Drug use: No  . Sexual activity: Not Currently  Other Topics Concern  . Not on file  Social History Narrative   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      She likes to read and she takes classes at [employer]:   . Difficulty of Paying Living Expenses:   Food Insecurity:   . Worried About Charity fundraiser in the Last Year:   . Arboriculturist in the Last Year:   Transportation Needs:   . Film/video editor (Medical):   [name] Lack of Transportation (Non-Medical):   Physical Activity:   . Days of Exercise per Week:   . Minutes of Exercise per Session:   Stress:   . Feeling of Stress :   Social Connections:   . Frequency of Communication with Friends and Family:   . Frequency of Social Gatherings with Friends and Family:   . Attends Religious Services:   . Active Member of Clubs or Organizations:   . Attends Archivist Meetings:   [name] Marital Status:      Family History: The patient's family history includes COPD in her father; Cancer in her father; Lung cancer in an other family member.  ROS:   Please see the history of present illness.    All other systems reviewed and are negative.  EKGs/Labs/Other Studies Reviewed:    The following studies were reviewed today: The patient's 2D echo images from today's study are reviewed.  The formal interpretation is currently pending.  LV function appears vigorous.  There is a contained wire perforation and appears unchanged.  There is mild to moderate paravalvular regurgitation around the [name] prosthesis which appears  unchanged from previous studies.  Peak and mean transvalvular gradients are measured at 24 and 12 mmHg, respectively.  The mitral valve is calcified and restricted.  EKG:  EKG is ordered today.  The ekg ordered today demonstrates normal sinus rhythm 74 bpm, left bundle branch block, first-degree AV block.  Recent Labs: 11/26/2018: ALT 10; Hemoglobin 11.3; Platelets 209.0; TSH 1.63 05/03/2019: BUN 33; Creatinine, Ser 1.33; Potassium 4.6; Sodium 146  Recent Lipid Panel    Component Value Date/Time   CHOL 212 (H) 11/26/2018 1201   TRIG 208.0 (H) 11/26/2018 1201   HDL 39.50 11/26/2018  1201   CHOLHDL 5 11/26/2018 1201   VLDL 41.6 (H) 11/26/2018 1201   LDLCALC 134 (H) 08/27/2017 1115   LDLDIRECT 143.0 11/26/2018 1201    Physical Exam:    VS:  BP (!) 128/50   Pulse 74   Ht 4\' 11"  (1.499 m)   Wt 157 lb 12.8 oz (71.6 kg)   SpO2 93%   BMI 31.87 kg/m     Wt Readings from Last 3 Encounters:  10/14/19 157 lb 12.8 oz (71.6 kg)  09/07/19 157 lb (71.2 kg)  04/19/19 160 lb (72.6 kg)     GEN:  Well nourished, well developed elderly woman in no acute distress HEENT: Normal NECK: No JVD; No carotid bruits LYMPHATICS: No lymphadenopathy CARDIAC: RRR, 2/6 systolic murmur at the right upper sternal border RESPIRATORY:  Clear to auscultation without rales, wheezing or rhonchi  ABDOMEN: Soft, non-tender, non-distended MUSCULOSKELETAL: 1+ bilateral pretibial edema; No deformity  SKIN: Warm and dry NEUROLOGIC:  Alert and oriented x 3 PSYCHIATRIC:  Normal affect   ASSESSMENT:    1. Chronic diastolic CHF (congestive heart failure) ([place])   2. Aortic valve disease   3. Essential hypertension   4. Coronary artery disease involving native coronary artery of native heart with angina pectoris ([place] [place] [place])    PLAN:    In order of problems listed above:  1. Appears stable on her current program.  I reviewed her echo today and will await formal interpretation.  Continue isosorbide, losartan, and  furosemide. 2. The patient's [name] bioprosthetic function appears stable with mild to moderate paravalvular regurgitation and stable transvalvular gradients. 3. Blood pressure is well controlled on carvedilol, isosorbide, and losartan. 4. Continue current therapy.  Patient on multiple antianginal drugs including amlodipine, carvedilol, and isosorbide.  Overall Sara Garza appears stable.  I advised her that she can take her furosemide on days when she is in the home and continue to hold it when she is out running errands because it significantly impacts her quality of life.  I will see her back in 6 months.   Medication Adjustments/Labs and Tests Ordered: Current medicines are reviewed at length with the patient today.  Concerns regarding medicines are outlined above.  Orders Placed This Encounter  Procedures  . EKG 12-Lead   No orders of the defined types were placed in this encounter.   Patient Instructions  Medication Instructions:  Your provider recommends that you continue on your current medications as directed. Please refer to the Current Medication list given to you today.   *If you need a refill on your cardiac medications before your next appointment, please call your pharmacy*  Follow-Up: At [facility], you and your health needs are our priority.  As part of our continuing mission to provide you with exceptional heart care, we have created designated Provider Care Teams.  These Care Teams include your primary Cardiologist (physician) and Advanced Practice Providers (APPs -  Physician Assistants and Nurse Practitioners) who all work together to provide you with the care you need, when you need it. Your next appointment:   6 month(s) The format for your next appointment:   In Person Provider:   You may see Sherren Mocha, MD or one of the following Advanced Practice Providers on your designated Care Team:    Richardson Dopp, PA-C  Robbie Lis, [place]      Signed, Sherren Mocha, MD  10/15/2019 3:57 PM    [place]

## 2019-10-27 DIAGNOSIS — M9903 Segmental and somatic dysfunction of lumbar region: Secondary | ICD-10-CM | POA: Diagnosis not present

## 2019-10-27 DIAGNOSIS — M542 Cervicalgia: Secondary | ICD-10-CM | POA: Diagnosis not present

## 2019-10-27 DIAGNOSIS — M9901 Segmental and somatic dysfunction of cervical region: Secondary | ICD-10-CM | POA: Diagnosis not present

## 2019-10-27 DIAGNOSIS — M9905 Segmental and somatic dysfunction of pelvic region: Secondary | ICD-10-CM | POA: Diagnosis not present

## 2019-10-27 DIAGNOSIS — M546 Pain in thoracic spine: Secondary | ICD-10-CM | POA: Diagnosis not present

## 2019-10-27 DIAGNOSIS — M9902 Segmental and somatic dysfunction of thoracic region: Secondary | ICD-10-CM | POA: Diagnosis not present

## 2019-11-02 ENCOUNTER — Other Ambulatory Visit: Payer: Self-pay | Admitting: Cardiovascular Disease

## 2019-11-02 DIAGNOSIS — I359 Nonrheumatic aortic valve disorder, unspecified: Secondary | ICD-10-CM

## 2019-11-02 DIAGNOSIS — I10 Essential (primary) hypertension: Secondary | ICD-10-CM

## 2019-11-02 DIAGNOSIS — I25119 Atherosclerotic heart disease of native coronary artery with unspecified angina pectoris: Secondary | ICD-10-CM

## 2019-11-05 ENCOUNTER — Other Ambulatory Visit: Payer: Self-pay | Admitting: Adult Health

## 2019-11-05 DIAGNOSIS — I2510 Atherosclerotic heart disease of native coronary artery without angina pectoris: Secondary | ICD-10-CM

## 2019-11-05 DIAGNOSIS — E785 Hyperlipidemia, unspecified: Secondary | ICD-10-CM

## 2019-11-05 DIAGNOSIS — E1121 Type 2 diabetes mellitus with diabetic nephropathy: Secondary | ICD-10-CM

## 2019-11-08 NOTE — Chronic Care Management (AMB) (Signed)
Chronic Care Management Pharmacy  Name: Sara Garza  MRN: [id number] DOB: 1923/04/26   Chief Complaint/ HPI  [place],  86 y.o. , female presents for their Initial CCM visit with the clinical pharmacist In office.  PCP : [name], NP Patient Care Team: [name], NP as PCP - General (Family Medicine) Sherren Mocha, MD as PCP - Cardiology (Cardiology) Sherren Mocha, MD (Cardiology) Suella Broad, MD as Consulting Physician (Physical Medicine and Rehabilitation) Irine Seal, MD as Attending Physician (Urology) Germaine Pomfret, [facility] [place] as Pharmacist (Pharmacist)  Their chronic conditions include: Hypertension, Hyperlipidemia, Diabetes, Heart Failure, Coronary Artery Disease, Chronic Kidney Disease and Osteoarthritis   Office Visits: 09/07/19: Patient presented to [name], NP for follow-up. X-Ray showed sipnal stenosis. Patient encouraged to take acetaminophen.   Consult Visit: 10/14/19: Patient presented to Dr. Burt Knack (Cardiology) for follow-up. Patient only takes furosemide on days where she does not have to go out.   Allergies  Allergen Reactions  . Statins Other (See Comments)    Muscle aches  . Gabapentin Other (See Comments)    SOMNOLENCE    Medications: Outpatient Encounter Medications as of 11/10/2019  Medication Sig  . amLODipine (NORVASC) 5 MG tablet TAKE 1 AND 1/2 TABLETS(7.5 MG) BY MOUTH DAILY  . aspirin EC 81 MG tablet Take 1 tablet (81 mg total) by mouth daily. Start after lovenox injections are done after 2 weeks  . bimatoprost (LUMIGAN) 0.01 % SOLN Place 1 drop into both eyes at bedtime.   . Calcium Carb-Cholecalciferol (CALCIUM 500 +D) 500-400 MG-UNIT TABS Take 1 tablet by mouth daily.   . carvedilol (COREG) 3.125 MG tablet TAKE 1 TABLET(3.125 MG) BY MOUTH TWICE DAILY WITH A MEAL  . furosemide (LASIX) 20 MG tablet Take 1 tablet (20 mg total) by mouth daily.  . isosorbide mononitrate (IMDUR) 60 MG 24 hr tablet TAKE 1 TABLET(60 MG) BY  MOUTH DAILY  . losartan (COZAAR) 25 MG tablet TAKE 1 TABLET(25 MG) BY MOUTH DAILY  . Multiple Vitamin (MULTIVITAMIN) tablet Take 1 tablet by mouth daily.    . Multiple Vitamins-Minerals (PRESERVISION AREDS 2+MULTI VIT PO) Take 2 capsules by mouth in the morning and at bedtime.  [name] amoxicillin (AMOXIL) 500 MG capsule TK 4 CAPSULES PO 1 HOUR PRIOR TO DENTAL PROCEDURES  . ezetimibe (ZETIA) 10 MG tablet TAKE 1 TABLET(10 MG) BY MOUTH DAILY   No facility-administered encounter medications on file as of 11/10/2019.   Current Diagnosis/Assessment:  SDOH Interventions     Most Recent Value  SDOH Interventions  Financial Strain Interventions Other (Comment)  [Will pursue Patient Assistance for costly medications]  Transportation Interventions Intervention Not Indicated      Goals Addressed   None    Diabetes   A1c goal <7%  Recent Relevant Labs: Lab Results  Component Value Date/Time   HGBA1C 6.2 11/26/2018 12:01 PM   HGBA1C 6.2 08/27/2017 11:15 AM   GFR 36.25 (L) 11/26/2018 12:01 PM   GFR 44.60 (L) 12/25/2017 12:18 PM   MICROALBUR 3.1 (H) 06/18/2016 09:45 AM    Last diabetic Eye exam:  Lab Results  Component Value Date/Time   HMDIABEYEEXA No Retinopathy 05/13/2019 12:00 AM    Last diabetic Foot exam:  Lab Results  Component Value Date/Time   HMDIABFOOTEX done 04/16/2010 12:00 AM    Checking BG: Never  Recent FBG Readings: n/a Recent pre-meal BG readings: n/a Recent 2hr PP BG readings:  n/a Recent HS BG readings: n/a  Patient has failed these meds in  past: Metformin Patient is currently controlled on the following medications: . None  We discussed: diet and exercise extensively  Plan  Continue control with diet and exercise  Hypertension   BP goal is:  <140/90  Office blood pressures are  BP Readings from Last 3 Encounters:  10/14/19 (!) 128/50  09/07/19 (!) 146/74  04/19/19 130/62   CMP Latest Ref Rng & Units 05/03/2019 11/26/2018 10/21/2018  Glucose 65 - 99  mg/dL 76 87 92  BUN 10 - 36 mg/dL 33 37(H) 34  Creatinine 0.57 - 1.00 mg/dL 1.33(H) 1.36(H) 1.33(H)  Sodium 134 - 144 mmol/L 146(H) 142 143  Potassium 3.5 - 5.2 mmol/L 4.6 4.5 4.6  Chloride 96 - 106 mmol/L 106 107 105  CO2 20 - 29 mmol/L '26 27 21  ' Calcium 8.7 - 10.3 mg/dL 9.2 9.0 9.5  Total Protein 6.0 - 8.3 g/dL - 6.9 -  Total Bilirubin 0.2 - 1.2 mg/dL - 0.4 -  Alkaline Phos 39 - 117 U/L - 54 -  AST 0 - 37 U/L - 15 -  ALT 0 - 35 U/L - 10 -   Patient checks BP at home infrequently Patient home BP readings are ranging: n/a  Patient has failed these meds in the past: valsartan, HCTZ,  Patient is currently controlled on the following medications:  . Amlodipine 5 mg 1.5 tablets daily  . Carvedilol 3.125 mg twice daily  . Furosemide 20 mg daily  . Isosorbide Mononitrate 60 mg daily  . Losartan 25 mg daily   We discussed: Patient misses multiple doses of evening carvedilol as she often falls asleep before taking the second dose. She is interested to know if there is a similar medication she can take just once daily. Otherwise denies symptoms of hypotension.   Plan  Recommend stopping carvedilol due to adherence challenges. Anticipate an increase in blood pressure due to alpha blockade of carvedilol, but should remain within goal.  Recommend starting metoprolol succinate 25 mg daily.   Heart Failure   S/p [name] 2014 Managed by Dr. Burt Knack   Type: Diastolic  Last ejection fraction: 65-70% NYHA Class: II (slight limitation of activity) AHA HF Stage: B (Heart disease present - no symptoms present)  Patient has failed these meds in past:  Patient is currently controlled on the following medications:  . Amlodipine 5 mg 1.5 tablets daily  . Carvedilol 3.125 mg twice daily  . Furosemide 20 mg daily  . Isosorbide Mononitrate 60 mg daily  . Losartan 25 mg daily  .  We discussed weighing daily; if you gain more than 3 pounds in one day or 5 pounds in one week call your  doctor  Plan  Recommend stopping carvedilol due to adherence challenges. Anticipate an increase in blood pressure due to alpha blockade of carvedilol, but should remain within goal.  Recommend starting metoprolol succinate 25 mg daily.   Hyperlipidemia   LDL goal < 100  Lipid Panel     Component Value Date/Time   CHOL 212 (H) 11/26/2018 1201   TRIG 208.0 (H) 11/26/2018 1201   HDL 39.50 11/26/2018 1201   LDLCALC 134 (H) 08/27/2017 1115   LDLDIRECT 143.0 11/26/2018 1201    Hepatic Function Latest Ref Rng & Units 11/26/2018 08/27/2017 06/18/2016  Total Protein 6.0 - 8.3 g/dL 6.9 7.1 7.3  Albumin 3.5 - 5.2 g/dL 3.8 3.8 3.8  AST 0 - 37 U/L '15 13 13  ' ALT 0 - 35 U/L '10 11 10  ' Alk Phosphatase 39 -  117 U/L 54 61 60  Total Bilirubin 0.2 - 1.2 mg/dL 0.4 0.5 0.5  Bilirubin, Direct 0.0 - 0.3 mg/dL - 0.1 0.1     The ASCVD Risk score ([place]., et al., 2013) failed to calculate for the following reasons:   The 2013 ASCVD risk score is only valid for ages 71 to 2   Patient has failed these meds in past: Rosuvastatin 20 mg  Patient is currently uncontrolled on the following medications:  . Aspirin 81 mg daily  . Ezetimibe 10 mg daily   We discussed:  diet and exercise extensively. Patient hesitant to make significant alterations to diet at this time.   Plan  Continue current medications   Overactive Bladder   Managed by Dr. Jeffie Pollock  Patient has failed these meds in past: n/a Patient is currently controlled on the following medications:  [name] Myrbetriq ER 25 mg daily   We discussed:  Symptoms well controlled on Myrbetriq. PAP unavailable for this patient.   Plan  Recommend stopping Myrbetriq due to cost.  Recommend starting Oxybutynin ER 5 mg daily   Misc / OTC    . Amoxicillin 500 mg 4 caps PRN dental procedures  . Calcium Carbonate + Vit D 500 mg - 400 units daily  . Multivitamin daily  . Lumigan ($100+ in donut hole) - Patient stated challenges affording Lumigan. She was  given new eye drops by her ophthalmologist but cannot remember which one.   . Preservision AREDS 2 two capsules twice daily   Plan  Continue current medications   Medication Management   Patient states she receives 550$ weekly social security. Relies on savings to help pay for medications while in [facility].   Pt uses [facility] for all medications Uses pill box? No  Plan  Continue current medication management strategy  Follow up: 6 month phone visit  [facility] [place]

## 2019-11-09 ENCOUNTER — Telehealth: Payer: Self-pay

## 2019-11-09 NOTE — Progress Notes (Signed)
°  Left Voice message to do initial question prior to patient appointment on 11/09/2019  for CCM at 2:00PM  with Junius Argyle the Clinical pharmacist.   [place] Pharmacist Assistant [phone]

## 2019-11-10 ENCOUNTER — Ambulatory Visit: Payer: Medicare Other

## 2019-11-10 ENCOUNTER — Other Ambulatory Visit: Payer: Self-pay

## 2019-11-10 DIAGNOSIS — I10 Essential (primary) hypertension: Secondary | ICD-10-CM

## 2019-11-10 DIAGNOSIS — M9903 Segmental and somatic dysfunction of lumbar region: Secondary | ICD-10-CM | POA: Diagnosis not present

## 2019-11-10 DIAGNOSIS — M546 Pain in thoracic spine: Secondary | ICD-10-CM | POA: Diagnosis not present

## 2019-11-10 DIAGNOSIS — M542 Cervicalgia: Secondary | ICD-10-CM | POA: Diagnosis not present

## 2019-11-10 DIAGNOSIS — M9905 Segmental and somatic dysfunction of pelvic region: Secondary | ICD-10-CM | POA: Diagnosis not present

## 2019-11-10 DIAGNOSIS — M9902 Segmental and somatic dysfunction of thoracic region: Secondary | ICD-10-CM | POA: Diagnosis not present

## 2019-11-10 DIAGNOSIS — M9901 Segmental and somatic dysfunction of cervical region: Secondary | ICD-10-CM | POA: Diagnosis not present

## 2019-11-10 DIAGNOSIS — E1121 Type 2 diabetes mellitus with diabetic nephropathy: Secondary | ICD-10-CM

## 2019-11-10 NOTE — Progress Notes (Deleted)
Patient Assistance:  The [employer] foundation offers assistance to help pay for medication copays.  They will cover copays for all cholesterol lowering meds, including statins, fibrates, omega-3 oils, ezetimibe, Repatha, Praluent, Nexletol, Nexlizet.  The cards are usually good for $2,500 or 12 months, whichever comes first. 1. Go to healthwellfoundation.org 2. Click on "Apply Now" 3. Answer questions as to whom is applying (patient or representative) 4. Your disease fund will be "hypercholesterolemia - Medicare access" 5. They will ask questions about finances and which medications you are taking for cholesterol 6. When you submit, the approval is usually within minutes.  You will need to print the card information from the site 7. You will need to show this information to your pharmacy, they will bill your Medicare Part D plan first -then bill [employer] --for the copay.

## 2019-11-12 NOTE — Patient Instructions (Signed)
Visit Information It was great speaking with you today!  Please let me know if you have any questions about our visit.  Goals Addressed            This Visit's Progress   . Chronic Care Management       CARE PLAN ENTRY (see longitudinal plan of care for additional care plan information)  Current Barriers:  . Chronic Disease Management support, education, and care coordination needs related to Hypertension, Hyperlipidemia, Diabetes, Heart Failure, Coronary Artery Disease, Chronic Kidney Disease and Osteoarthritis    Hypertension BP Readings from Last 3 Encounters:  10/14/19 (!) 128/50  09/07/19 (!) 146/74  04/19/19 130/62   . Pharmacist Clinical Goal(s): o Over the next 90 days, patient will work with PharmD and providers to maintain BP goal <140/90 . Current regimen:  . Amlodipine 5 mg 1.5 tablets daily  . Carvedilol 3.125 mg twice daily  . Furosemide 20 mg daily  . Isosorbide Mononitrate 60 mg daily  . Losartan 25 mg daily  . Interventions: o Discussed low salt diet and exercising as tolerated extensively . Patient self care activities - Over the next 90 days, patient will: o Check blood pressure every 1-2 weeks, document, and provide at future appointments o Ensure daily salt intake < 2300 mg/day  Hyperlipidemia Lab Results  Component Value Date/Time   LDLCALC 134 (H) 08/27/2017 11:15 AM   LDLDIRECT 143.0 11/26/2018 12:01 PM   . Pharmacist Clinical Goal(s): o Over the next 90 days, patient will work with PharmD and providers to achieve LDL goal < 100 . Current regimen:  o Ezetimibe 10 mg daily  . Interventions: o Discussed low cholesterol diet and exercising as tolerated extensively . Patient self care activities - Over the next 90 days, patient will: o Apply for Patient Assistance:  The [employer] foundation offers assistance to help pay for medication copays.  They will cover copays for all cholesterol lowering meds, including statins, fibrates, omega-3 oils,  ezetimibe, Repatha, Praluent, Nexletol, Nexlizet.  The cards are usually good for $2,500 or 12 months, whichever comes first. 1. Go to healthwellfoundation.org 2. Click on "Apply Now" 3. Answer questions as to whom is applying (patient or representative) 4. Your disease fund will be "hypercholesterolemia - Medicare access" 5. They will ask questions about finances and which medications you are taking for cholesterol 6. When you submit, the approval is usually within minutes.  You will need to print the card information from the site 7. You will need to show this information to your pharmacy, they will bill your Medicare Part D plan first -then bill [employer] Well --for the copay.    Diabetes Lab Results  Component Value Date/Time   HGBA1C 6.2 11/26/2018 12:01 PM   HGBA1C 6.2 08/27/2017 11:15 AM   . Pharmacist Clinical Goal(s): o Over the next 90 days, patient will work with PharmD and providers to maintain A1c goal <6.5% . Current regimen:  o None . Interventions: o Discussed carbohydrate counting and exercising as tolerated extensively . Patient self care activities - Over the next 90 days, patient will: o Contact provider with any episodes of hypoglycemia  Medication management . Pharmacist Clinical Goal(s): o Over the next 90 days, patient will work with PharmD and providers to maintain optimal medication adherence . Current pharmacy: [facility] . Interventions o Comprehensive medication review performed. o Continue current medication management strategy . Patient self care activities - Over the next 90 days, patient will: o Take medications as prescribed o Report  any questions or concerns to PharmD and/or provider(s)       Ms. Fennema was given information about Chronic Care Management services today including:  1. CCM service includes personalized support from designated clinical staff supervised by her physician, including individualized plan of care and coordination with other  care providers 2. 24/7 contact phone numbers for assistance for urgent and routine care needs. 3. Standard insurance, coinsurance, copays and deductibles apply for chronic care management only during months in which we provide at least 20 minutes of these services. Most insurances cover these services at 100%, however patients may be responsible for any copay, coinsurance and/or deductible if applicable. This service may help you avoid the need for more expensive face-to-face services. 4. Only one practitioner may furnish and bill the service in a calendar month. 5. The patient may stop CCM services at any time (effective at the end of the month) by phone call to the office staff.  Patient agreed to services and verbal consent obtained.   The patient verbalized understanding of instructions provided today and declined a print copy of patient instruction materials.  The pharmacy team will reach out to the patient again over the next 30 days.   Doristine Section Clinical Pharmacist [facility]  [place] Eating Plan [place] stands for "Dietary Approaches to Stop Hypertension." The DASH eating plan is a healthy eating plan that has been shown to reduce high blood pressure (hypertension). It may also reduce your risk for type 2 diabetes, heart disease, and stroke. The DASH eating plan may also help with weight loss. What are tips for following this plan?  General guidelines  Avoid eating more than 2,300 mg (milligrams) of salt (sodium) a day. If you have hypertension, you may need to reduce your sodium intake to 1,500 mg a day.  Limit alcohol intake to no more than 1 drink a day for nonpregnant women and 2 drinks a day for men. One drink equals 12 oz of beer, 5 oz of wine, or 1 oz of hard liquor.  Work with your health care provider to maintain a healthy body weight or to lose weight. Ask what an ideal weight is for you.  Get at least 30 minutes of exercise that  causes your heart to beat faster (aerobic exercise) most days of the week. Activities may include walking, swimming, or biking.  Work with your health care provider or diet and nutrition specialist (dietitian) to adjust your eating plan to your individual calorie needs. Reading food labels   Check food labels for the amount of sodium per serving. Choose foods with less than 5 percent of the Daily Value of sodium. Generally, foods with less than 300 mg of sodium per serving fit into this eating plan.  To find whole grains, look for the word "whole" as the first word in the ingredient list. Shopping  Buy products labeled as "low-sodium" or "no salt added."  Buy fresh foods. Avoid canned foods and premade or frozen meals. Cooking  Avoid adding salt when cooking. Use salt-free seasonings or herbs instead of table salt or sea salt. Check with your health care provider or pharmacist before using salt substitutes.  Do not fry foods. Cook foods using healthy methods such as baking, boiling, grilling, and broiling instead.  Cook with heart-healthy oils, such as olive, canola, soybean, or sunflower oil. Meal planning  Eat a balanced diet that includes: ? 5 or more servings of fruits and vegetables each day. At each  meal, try to fill half of your plate with fruits and vegetables. ? Up to 6-8 servings of whole grains each day. ? Less than 6 oz of lean meat, poultry, or fish each day. A 3-oz serving of meat is about the same size as a deck of cards. One egg equals 1 oz. ? 2 servings of low-fat dairy each day. ? A serving of nuts, seeds, or beans 5 times each week. ? Heart-healthy fats. Healthy fats called Omega-3 fatty acids are found in foods such as flaxseeds and coldwater fish, like sardines, salmon, and mackerel.  Limit how much you eat of the following: ? Canned or prepackaged foods. ? Food that is high in trans fat, such as fried foods. ? Food that is high in saturated fat, such as fatty  meat. ? Sweets, desserts, sugary drinks, and other foods with added sugar. ? Full-fat dairy products.  Do not salt foods before eating.  Try to eat at least 2 vegetarian meals each week.  Eat more home-cooked food and less restaurant, buffet, and fast food.  When eating at a restaurant, ask that your food be prepared with less salt or no salt, if possible. What foods are recommended? The items listed may not be a complete list. Talk with your dietitian about what dietary choices are best for you. Grains Whole-grain or whole-wheat bread. Whole-grain or whole-wheat pasta. Brown rice. [place]. Bulgur. Whole-grain and low-sodium cereals. Pita bread. Low-fat, low-sodium crackers. Whole-wheat flour tortillas. Vegetables Fresh or frozen vegetables (raw, steamed, roasted, or grilled). Low-sodium or reduced-sodium tomato and vegetable juice. Low-sodium or reduced-sodium tomato sauce and tomato paste. Low-sodium or reduced-sodium canned vegetables. Fruits All fresh, dried, or frozen fruit. Canned fruit in natural juice (without added sugar). Meat and other protein foods Skinless chicken or [place]. Ground chicken or [place]. Pork with fat trimmed off. Fish and seafood. Egg whites. Dried beans, peas, or lentils. Unsalted nuts, nut butters, and seeds. Unsalted canned beans. Lean cuts of beef with fat trimmed off. Low-sodium, lean deli meat. Dairy Low-fat (1%) or fat-free (skim) milk. Fat-free, low-fat, or reduced-fat cheeses. Nonfat, low-sodium ricotta or cottage cheese. Low-fat or nonfat yogurt. Low-fat, low-sodium cheese. Fats and oils Soft margarine without trans fats. Vegetable oil. Low-fat, reduced-fat, or light mayonnaise and salad dressings (reduced-sodium). Canola, safflower, olive, soybean, and sunflower oils. Avocado. Seasoning and other foods Herbs. Spices. Seasoning mixes without salt. Unsalted popcorn and pretzels. Fat-free sweets. What foods are not recommended? The items listed  may not be a complete list. Talk with your dietitian about what dietary choices are best for you. Grains Baked goods made with fat, such as croissants, muffins, or some breads. Dry pasta or rice meal packs. Vegetables Creamed or fried vegetables. Vegetables in a cheese sauce. Regular canned vegetables (not low-sodium or reduced-sodium). Regular canned tomato sauce and paste (not low-sodium or reduced-sodium). Regular tomato and vegetable juice (not low-sodium or reduced-sodium). Angie Fava. Olives. Fruits Canned fruit in a light or heavy syrup. Fried fruit. Fruit in cream or butter sauce. Meat and other protein foods Fatty cuts of meat. Ribs. Fried meat. Berniece Salines. Sausage. Bologna and other processed lunch meats. Salami. Fatback. Hotdogs. Bratwurst. Salted nuts and seeds. Canned beans with added salt. Canned or smoked fish. Whole eggs or egg yolks. Chicken or [place] with skin. Dairy Whole or 2% milk, cream, and half-and-half. Whole or full-fat cream cheese. Whole-fat or sweetened yogurt. Full-fat cheese. Nondairy creamers. Whipped toppings. Processed cheese and cheese spreads. Fats and oils Butter. Stick margarine. Lard. Shortening.  [place]nia Lora fat. Tropical oils, such as coconut, palm kernel, or palm oil. Seasoning and other foods Salted popcorn and pretzels. Onion salt, garlic salt, seasoned salt, table salt, and sea salt. Worcestershire sauce. Tartar sauce. Barbecue sauce. Teriyaki sauce. Soy sauce, including reduced-sodium. Steak sauce. Canned and packaged gravies. Fish sauce. Oyster sauce. Cocktail sauce. Horseradish that you find on the shelf. Ketchup. Mustard. Meat flavorings and tenderizers. Bouillon cubes. Hot sauce and Tabasco sauce. Premade or packaged marinades. Premade or packaged taco seasonings. Relishes. Regular salad dressings. Where to find more information:  National Heart, Lung, and [place]: https://wilson-eaton.com/  American Heart Association: www.heart.org Summary  The DASH  eating plan is a healthy eating plan that has been shown to reduce high blood pressure (hypertension). It may also reduce your risk for type 2 diabetes, heart disease, and stroke.  With the DASH eating plan, you should limit salt (sodium) intake to 2,300 mg a day. If you have hypertension, you may need to reduce your sodium intake to 1,500 mg a day.  When on the DASH eating plan, aim to eat more fresh fruits and vegetables, whole grains, lean proteins, low-fat dairy, and heart-healthy fats.  Work with your health care provider or diet and nutrition specialist (dietitian) to adjust your eating plan to your individual calorie needs. This information is not intended to replace advice given to you by your health care provider. Make sure you discuss any questions you have with your health care provider. Document Revised: 02/28/2017 Document Reviewed: 03/11/2016 Elsevier Patient Education  2020 [employer].

## 2019-11-14 NOTE — Progress Notes (Signed)
I think that would be fine thanks!

## 2019-11-15 ENCOUNTER — Telehealth: Payer: Self-pay | Admitting: Physical Medicine and Rehabilitation

## 2019-11-15 ENCOUNTER — Telehealth: Payer: Self-pay

## 2019-11-15 NOTE — Progress Notes (Signed)
Left Voice message to inform patient the clinical Pharmacist Junius Argyle spoke with Dr. Burt Knack the patient cardiologist , they both came into agreement with  switching her carvedilol to metoprolol. Once patient receives it from the pharmacy, she can stop carvedilol and take metoprolol once daily per clinical pharmacist Junius Argyle.   [place] Pharmacist Assistant [phone]

## 2019-11-15 NOTE — Telephone Encounter (Signed)
Patient last saw you in 2018. It looks like she has seen Dr. Nelva Bush since. [place] for OV?

## 2019-11-15 NOTE — Telephone Encounter (Signed)
Patient called.   She is requesting a call back to set up an appointment for her lower back to be seen  Call back: [phone]

## 2019-11-16 ENCOUNTER — Telehealth: Payer: Self-pay

## 2019-11-16 ENCOUNTER — Telehealth: Payer: Self-pay | Admitting: Physical Medicine and Rehabilitation

## 2019-11-16 NOTE — Telephone Encounter (Signed)
Ok, would be nice though to get their notes

## 2019-11-16 NOTE — Telephone Encounter (Signed)
Patient called. She would like to schedule an appointment with Dr. Newton.  

## 2019-11-16 NOTE — Telephone Encounter (Signed)
Scheduled for OV 8/31. Patient will get notes from Dr. Nelva Bush.

## 2019-11-16 NOTE — Progress Notes (Addendum)
Chronic Care Management Pharmacy Assistant   Name: Sara Garza  MRN: [id number] DOB: 02-Oct-1923  Reason for Encounter: Medication Review /patient assistance.  PCP : [name], NP  Allergies:   Allergies  Allergen Reactions   Statins Other (See Comments)    Muscle aches   Gabapentin Other (See Comments)    SOMNOLENCE    Medications: Outpatient Encounter Medications as of 11/16/2019  Medication Sig   amLODipine (NORVASC) 5 MG tablet TAKE 1 AND 1/2 TABLETS(7.5 MG) BY MOUTH DAILY   amoxicillin (AMOXIL) 500 MG capsule TK 4 CAPSULES PO 1 HOUR PRIOR TO DENTAL PROCEDURES   aspirin EC 81 MG tablet Take 1 tablet (81 mg total) by mouth daily. Start after lovenox injections are done after 2 weeks   bimatoprost (LUMIGAN) 0.01 % SOLN Place 1 drop into both eyes at bedtime.    Calcium Carb-Cholecalciferol (CALCIUM 500 +D) 500-400 MG-UNIT TABS Take 1 tablet by mouth daily.    carvedilol (COREG) 3.125 MG tablet TAKE 1 TABLET(3.125 MG) BY MOUTH TWICE DAILY WITH A MEAL   ezetimibe (ZETIA) 10 MG tablet TAKE 1 TABLET(10 MG) BY MOUTH DAILY   furosemide (LASIX) 20 MG tablet Take 1 tablet (20 mg total) by mouth daily.   isosorbide mononitrate (IMDUR) 60 MG 24 hr tablet TAKE 1 TABLET(60 MG) BY MOUTH DAILY   losartan (COZAAR) 25 MG tablet TAKE 1 TABLET(25 MG) BY MOUTH DAILY   Multiple Vitamin (MULTIVITAMIN) tablet Take 1 tablet by mouth daily.     Multiple Vitamins-Minerals (PRESERVISION AREDS 2+MULTI VIT PO) Take 2 capsules by mouth in the morning and at bedtime.   MYRBETRIQ 25 MG TB24 tablet Take 25 mg by mouth daily.   No facility-administered encounter medications on file as of 11/16/2019.    Current Diagnosis: Patient Active Problem List   Diagnosis Date Noted   Chronic diastolic CHF (congestive heart failure) ([place]) 02/25/2018   S/P [name] (transcatheter aortic valve replacement) 02/25/2018   Leg pain    Hyperlipidemia    Glaucoma    Carotid stenosis, left    Arthritis    Urinary  incontinence 02/12/2017   Chronic bilateral low back pain 08/12/2016   Spondylosis of lumbar joint 08/12/2016   Bilateral leg pain 08/12/2016   Neck mass 04/15/2015   Status post hip hemiarthroplasty 07/31/2013   CAD (coronary artery disease) 07/31/2013   CKD (chronic kidney disease), stage III 07/31/2013   Left bundle branch block 07/31/2013   Severe calcific aortic stenosis 06/18/2010   Diabetic polyneuropathy ([place]) 04/16/2010   GLAUCOMA 01/06/2009   CHEST PAIN, ATYPICAL 12/16/2008   Bilateral shoulder pain 11/16/2007   OSTEOARTHRITIS 02/02/2007   Type 2 diabetes mellitus with renal manifestations, controlled ([place]) 10/24/2006   Dyslipidemia 10/02/2006   Essential hypertension 10/02/2006    Goals Addressed   None     Follow-Up:  Patient Assistance Coordination   Spoke to patient to inform her  we are sending her a Patient assistance form  for [employer] by mail.Informed patient to include a copy of her  proof of income AND a copy of her Explanation of Benefits (EOB) statement from her insurance. Advised patient to return the PAP forms back to the [facility].  Informed patient the clinical Pharmacist Junius Argyle spoke with Dr. Burt Knack the patient's cardiologist , they both came into agreement with  switching her carvedilol to metoprolol. Once patient receives it from the pharmacy, she can stop carvedilol and take metoprolol once daily per clinical pharmacist Junius Argyle.  Patient Verbalized understanding  [place] Clinical Pharmacist Assistant [phone]

## 2019-11-24 DIAGNOSIS — Z961 Presence of intraocular lens: Secondary | ICD-10-CM | POA: Diagnosis not present

## 2019-11-24 DIAGNOSIS — H401132 Primary open-angle glaucoma, bilateral, moderate stage: Secondary | ICD-10-CM | POA: Diagnosis not present

## 2019-11-24 DIAGNOSIS — H01002 Unspecified blepharitis right lower eyelid: Secondary | ICD-10-CM | POA: Diagnosis not present

## 2019-11-24 DIAGNOSIS — H353132 Nonexudative age-related macular degeneration, bilateral, intermediate dry stage: Secondary | ICD-10-CM | POA: Diagnosis not present

## 2019-11-26 DIAGNOSIS — M9903 Segmental and somatic dysfunction of lumbar region: Secondary | ICD-10-CM | POA: Diagnosis not present

## 2019-11-26 DIAGNOSIS — M542 Cervicalgia: Secondary | ICD-10-CM | POA: Diagnosis not present

## 2019-11-26 DIAGNOSIS — M9901 Segmental and somatic dysfunction of cervical region: Secondary | ICD-10-CM | POA: Diagnosis not present

## 2019-11-26 DIAGNOSIS — M9902 Segmental and somatic dysfunction of thoracic region: Secondary | ICD-10-CM | POA: Diagnosis not present

## 2019-11-26 DIAGNOSIS — M9905 Segmental and somatic dysfunction of pelvic region: Secondary | ICD-10-CM | POA: Diagnosis not present

## 2019-11-26 DIAGNOSIS — M546 Pain in thoracic spine: Secondary | ICD-10-CM | POA: Diagnosis not present

## 2019-11-30 ENCOUNTER — Encounter: Payer: Self-pay | Admitting: Physical Medicine and Rehabilitation

## 2019-11-30 ENCOUNTER — Other Ambulatory Visit: Payer: Self-pay

## 2019-11-30 ENCOUNTER — Telehealth: Payer: Self-pay | Admitting: Physical Medicine and Rehabilitation

## 2019-11-30 ENCOUNTER — Ambulatory Visit (INDEPENDENT_AMBULATORY_CARE_PROVIDER_SITE_OTHER): Payer: Medicare Other | Admitting: Physical Medicine and Rehabilitation

## 2019-11-30 VITALS — BP 126/72 | HR 99

## 2019-11-30 DIAGNOSIS — I25119 Atherosclerotic heart disease of native coronary artery with unspecified angina pectoris: Secondary | ICD-10-CM

## 2019-11-30 DIAGNOSIS — M5441 Lumbago with sciatica, right side: Secondary | ICD-10-CM

## 2019-11-30 DIAGNOSIS — M4315 Spondylolisthesis, thoracolumbar region: Secondary | ICD-10-CM | POA: Diagnosis not present

## 2019-11-30 DIAGNOSIS — E1142 Type 2 diabetes mellitus with diabetic polyneuropathy: Secondary | ICD-10-CM | POA: Diagnosis not present

## 2019-11-30 DIAGNOSIS — G8929 Other chronic pain: Secondary | ICD-10-CM

## 2019-11-30 DIAGNOSIS — M5416 Radiculopathy, lumbar region: Secondary | ICD-10-CM

## 2019-11-30 DIAGNOSIS — M5442 Lumbago with sciatica, left side: Secondary | ICD-10-CM | POA: Diagnosis not present

## 2019-11-30 NOTE — Telephone Encounter (Signed)
Medical records from [facility] were resent. I advised patient that I would keep an eye out for them tomorrow.

## 2019-11-30 NOTE — Telephone Encounter (Signed)
Pt just stated that she would like a CB from [place]

## 2019-11-30 NOTE — Progress Notes (Signed)
Low back and leg pain. Bilateral buttock pain and posterior thigh pain. Pain comes around front of thighs. Had 2 injections from Dr. Nelva Bush. Patient requested records from that office. Did not want to return because of injections being done in surgery center.  Numeric Pain Rating Scale and Functional Assessment Average Pain 6 Pain Right Now 5 My pain is constant and burning Pain is worse with: walking and standing Pain improves with: medication and tylenol   In the last MONTH (on 0-10 scale) has pain interfered with the following?  1. General activity like being  able to carry out your everyday physical activities such as walking, climbing stairs, carrying groceries, or moving a chair?  Rating(10)  2. Relation with others like being able to carry out your usual social activities and roles such as  activities at home, at work and in your community. Rating(10)  3. Enjoyment of life such that you have  been bothered by emotional problems such as feeling anxious, depressed or irritable?  Rating(9)

## 2019-12-01 ENCOUNTER — Ambulatory Visit (INDEPENDENT_AMBULATORY_CARE_PROVIDER_SITE_OTHER): Payer: Medicare Other | Admitting: Adult Health

## 2019-12-01 ENCOUNTER — Encounter: Payer: Self-pay | Admitting: Adult Health

## 2019-12-01 ENCOUNTER — Other Ambulatory Visit: Payer: Self-pay | Admitting: Adult Health

## 2019-12-01 ENCOUNTER — Encounter: Payer: Self-pay | Admitting: Physical Medicine and Rehabilitation

## 2019-12-01 VITALS — BP 124/58 | HR 80 | Temp 98.1°F | Ht <= 58 in | Wt 158.2 lb

## 2019-12-01 DIAGNOSIS — I5032 Chronic diastolic (congestive) heart failure: Secondary | ICD-10-CM | POA: Diagnosis not present

## 2019-12-01 DIAGNOSIS — E1121 Type 2 diabetes mellitus with diabetic nephropathy: Secondary | ICD-10-CM

## 2019-12-01 DIAGNOSIS — N3281 Overactive bladder: Secondary | ICD-10-CM

## 2019-12-01 DIAGNOSIS — I10 Essential (primary) hypertension: Secondary | ICD-10-CM | POA: Diagnosis not present

## 2019-12-01 DIAGNOSIS — E785 Hyperlipidemia, unspecified: Secondary | ICD-10-CM

## 2019-12-01 MED ORDER — OXYBUTYNIN CHLORIDE ER 5 MG PO TB24: 5.0000 mg | ORAL_TABLET | Freq: Every day | ORAL | 1 refills | Status: DC

## 2019-12-01 NOTE — Patient Instructions (Addendum)
It was great seeing you today   We will follow up with you regarding your blood work   I have switched your medication from Mybetriq and start Oxybutyin - let me know in about two weeks how you are feeling

## 2019-12-01 NOTE — Progress Notes (Signed)
Subjective:    Patient ID: Sara Garza, female    DOB: [birth year], 86 y.o.   MRN: [id number]  HPI Patient presents for yearly preventative medicine examination. She is a pleasant 86 year old female who  has a past medical history of Arthritis, CAD (coronary artery disease), Carotid stenosis, left, Diabetes mellitus, Glaucoma, Hyperlipidemia, Hypertension, Leg pain, Osteoporosis, and Valvular heart disease.  DM -she is diet controlled.  Does not check her blood sugars at home.  She denies episodes of hypoglycemia, also denies polydipsia and polyuria. Lab Results  Component Value Date   HGBA1C 6.2 11/26/2018   Hypertension -currently controlled with Norvasc 7.5 mg daily, carvedilol 3.125 mg twice daily, losartan 25 mg daily, isosorbide 60 mg daily, and furosemide 20 mg daily.  She denies dizziness, lightheadedness, chest pain BP Readings from Last 3 Encounters:  12/01/19 (!) 124/58  11/30/19 126/72  10/14/19 (!) 128/50   CHF -is managed by cardiology, Dr. Burt Knack.  She had a [name] done in 2014.  Currently maintained on furosemide 20 mg daily, carvedilol 3.125 mg daily, and isosorbide 60 mg daily.  Hyperlipidemia -statin intolerant.  Currently prescribed Zetia 10 mg daily and aspirin 81 mg daily. Lab Results  Component Value Date   CHOL 212 (H) 11/26/2018   HDL 39.50 11/26/2018   LDLCALC 134 (H) 08/27/2017   LDLDIRECT 143.0 11/26/2018   TRIG 208.0 (H) 11/26/2018   CHOLHDL 5 11/26/2018   OAB -was prescribed Myrbetriq by Urology for the last year but has not had any relief. She continues to have issues with urinary incontinence and finds that the medication is very expensive. She would like to try a different medication   Chronic Back Pain -is now being seen by Dr. Ernestina Patches at [facility].  Was seen yesterday and will return tomorrow for bilateral L4 trans foraminal epidural steroid injections   All immunizations and health maintenance protocols were reviewed with the  patient and needed orders were placed. She is up to date on vaccinations   Appropriate screening laboratory values were ordered for the patient including screening of hyperlipidemia, renal function and hepatic function.  Medication reconciliation,  past medical history, social history, problem list and allergies were reviewed in detail with the patient  Goals were established with regard to weight loss, exercise, and  diet in compliance with medications  End of life planning was discussed. She has an advanced directive and living will.   Review of Systems  Constitutional: Negative.   HENT: Negative.   Eyes: Negative.   Respiratory: Negative.   Cardiovascular: Positive for leg swelling.  Gastrointestinal: Negative.   Endocrine: Negative.   Genitourinary: Positive for frequency and urgency.  Musculoskeletal: Positive for arthralgias and back pain.  Skin: Negative.   Allergic/Immunologic: Negative.   Neurological: Negative.   Hematological: Negative.   Psychiatric/Behavioral: Negative.    Past Medical History:  Diagnosis Date  . Arthritis   . CAD (coronary artery disease)   . Carotid stenosis, left    50-60% stable  . Diabetes mellitus   . Glaucoma   . Hyperlipidemia   . Hypertension   . Leg pain    ABIs 2/18: normal bilaterally.  . Osteoporosis   . Valvular heart disease    Aortic Stenosis s/p [name] 2014 // Mitral stenosis // Echo 09/2018: EF > 65, mod LVH, severe focal basal hypertrophy, RVSP 39.8, mild to mod MR, mod to severe MS (mean 9), AVR with mild AI, severe LAE (similar to prior echo)  Social History   Socioeconomic History  . Marital status: Widowed    Spouse name: Not on file  . Number of children: Not on file  . Years of education: Not on file  . Highest education level: Not on file  Occupational History  . Not on file  Tobacco Use  . Smoking status: Passive Smoke Exposure - Never Smoker  . Smokeless tobacco: Never Used  Substance and Sexual Activity    . Alcohol use: Yes    Alcohol/week: 0.0 standard drinks    Comment: very seldom  . Drug use: No  . Sexual activity: Not Currently  Other Topics Concern  . Not on file  Social History Narrative   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      She likes to read and she takes classes at [employer] Strain: High Risk  . Difficulty of Paying Living Expenses: Hard  Food Insecurity:   . Worried About Charity fundraiser in the Last Year: Not on file  . Ran Out of Food in the Last Year: Not on file  Transportation Needs: No Transportation Needs  . Lack of Transportation (Medical): No  . Lack of Transportation (Non-Medical): No  Physical Activity:   . Days of Exercise per Week: Not on file  . Minutes of Exercise per Session: Not on file  Stress:   . Feeling of Stress : Not on file  Social Connections:   . Frequency of Communication with Friends and Family: Not on file  . Frequency of Social Gatherings with Friends and Family: Not on file  . Attends Religious Services: Not on file  . Active Member of Clubs or Organizations: Not on file  . Attends Archivist Meetings: Not on file  . Marital Status: Not on file  Intimate Partner Violence:   . Fear of Current or Ex-Partner: Not on file  . Emotionally Abused: Not on file  . Physically Abused: Not on file  . Sexually Abused: Not on file    Past Surgical History:  Procedure Laterality Date  . ANOMALOUS PULMONARY VENOUS RETURN REPAIR, TOTAL    . APPENDECTOMY    . CARDIAC VALVE REPLACEMENT  04/29/2012  . Cataracts Bilateral   . CESAREAN SECTION    . FOOT SURGERY     [name]  . HIP ARTHROPLASTY Right 07/31/2013   Procedure: ARTHROPLASTY BIPOLAR HIP;  Surgeon: [name], MD;  Location: [facility];  Service: Orthopedics;  Laterality: Right;  . PERCUTANEOUS CORONARY STENT INTERVENTION (PCI-S) N/A 01/02/2012   Procedure: PERCUTANEOUS  CORONARY STENT INTERVENTION (PCI-S);  Surgeon: Sherren Mocha, MD;  Location: [facility] CATH LAB;  Service: Cardiovascular;  Laterality: N/A;    Family History  Problem Relation Age of Onset  . COPD Father   . Cancer Father        lung cancer  . Lung cancer Other     Allergies  Allergen Reactions  . Statins Other (See Comments)    Muscle aches  . Gabapentin Other (See Comments)    SOMNOLENCE    Current Outpatient Medications on File Prior to Visit  Medication Sig Dispense Refill  . amLODipine (NORVASC) 5 MG tablet TAKE 1 AND 1/2 TABLETS(7.5 MG) BY MOUTH DAILY 135 tablet 3  . amoxicillin (AMOXIL) 500 MG capsule TK 4 CAPSULES PO 1 HOUR PRIOR TO DENTAL PROCEDURES 12 capsule 4  . aspirin  EC 81 MG tablet Take 1 tablet (81 mg total) by mouth daily. Start after lovenox injections are done after 2 weeks    . bimatoprost (LUMIGAN) 0.01 % SOLN Place 1 drop into both eyes at bedtime.     . Calcium Carb-Cholecalciferol (CALCIUM 500 +D) 500-400 MG-UNIT TABS Take 1 tablet by mouth daily.     . carvedilol (COREG) 3.125 MG tablet TAKE 1 TABLET(3.125 MG) BY MOUTH TWICE DAILY WITH A MEAL 180 tablet 1  . ezetimibe (ZETIA) 10 MG tablet TAKE 1 TABLET(10 MG) BY MOUTH DAILY 30 tablet 11  . furosemide (LASIX) 20 MG tablet Take 1 tablet (20 mg total) by mouth daily. 90 tablet 3  . isosorbide mononitrate (IMDUR) 60 MG 24 hr tablet TAKE 1 TABLET(60 MG) BY MOUTH DAILY 90 tablet 3  . losartan (COZAAR) 25 MG tablet TAKE 1 TABLET(25 MG) BY MOUTH DAILY 90 tablet 1  . Multiple Vitamin (MULTIVITAMIN) tablet Take 1 tablet by mouth daily.      . Multiple Vitamins-Minerals (PRESERVISION AREDS 2+MULTI VIT PO) Take 2 capsules by mouth in the morning and at bedtime.     No current facility-administered medications on file prior to visit.    BP (!) 124/58 (BP Location: Right Arm, Patient Position: Sitting, Cuff Size: Normal)   Pulse 80   Temp 98.1 F (36.7 C) (Oral)   Ht 4\' 9"  (1.448 m)   Wt 158 lb 3.2 oz (71.8 kg)    SpO2 94%   BMI 34.23 kg/m       Objective:   Physical Exam Vitals and nursing note reviewed.  Constitutional:      General: She is not in acute distress.    Appearance: Normal appearance. She is well-developed. She is not ill-appearing.  HENT:     Head: Normocephalic and atraumatic.     Right Ear: Tympanic membrane, ear canal and external ear normal. There is no impacted cerumen.     Left Ear: Tympanic membrane, ear canal and external ear normal. There is no impacted cerumen.     Nose: Nose normal. No congestion or rhinorrhea.     Mouth/Throat:     Mouth: Mucous membranes are moist.     Pharynx: Oropharynx is clear. No oropharyngeal exudate or posterior oropharyngeal erythema.  Eyes:     General:        Right eye: No discharge.        Left eye: No discharge.     Extraocular Movements: Extraocular movements intact.     Conjunctiva/sclera: Conjunctivae normal.     Pupils: Pupils are equal, round, and reactive to light.  Neck:     Thyroid: No thyromegaly.     Vascular: No carotid bruit.     Trachea: No tracheal deviation.  Cardiovascular:     Rate and Rhythm: Normal rate and regular rhythm.     Pulses: Normal pulses.     Heart sounds: Murmur heard.  No friction rub. No gallop.   Pulmonary:     Effort: Pulmonary effort is normal. No respiratory distress.     Breath sounds: Normal breath sounds. No stridor. No wheezing, rhonchi or rales.  Chest:     Chest wall: No tenderness.  Abdominal:     General: Abdomen is flat. Bowel sounds are normal. There is no distension.     Palpations: Abdomen is soft. There is no mass.     Tenderness: There is no abdominal tenderness. There is no right CVA tenderness, left CVA tenderness, guarding or rebound.  Hernia: No hernia is present.  Musculoskeletal:        General: No swelling, tenderness, deformity or signs of injury. Normal range of motion.     Cervical back: Normal range of motion and neck supple.     Right lower leg: Edema  present.     Left lower leg: Edema present.  Lymphadenopathy:     Cervical: No cervical adenopathy.  Skin:    General: Skin is warm and dry.     Coloration: Skin is not jaundiced or pale.     Findings: No bruising, erythema, lesion or rash.  Neurological:     General: No focal deficit present.     Mental Status: She is alert and oriented to person, place, and time.     Cranial Nerves: No cranial nerve deficit.     Sensory: No sensory deficit.     Motor: No weakness.     Coordination: Coordination normal.     Gait: Gait abnormal.     Deep Tendon Reflexes: Reflexes normal.     Comments: Slow steady gait with cane   Psychiatric:        Mood and Affect: Mood normal.        Behavior: Behavior normal.        Thought Content: Thought content normal.        Judgment: Judgment normal.       Assessment & Plan:  1. Controlled type 2 diabetes mellitus with diabetic nephropathy, without long-term current use of insulin ([place]) -Consider Metformin, but unlikely due to age. - CBC with Differential/Platelet; Future - Comprehensive metabolic panel; Future - Lipid panel; Future - TSH; Future - Hemoglobin A1c; Future - Hemoglobin A1c - TSH - Lipid panel - Comprehensive metabolic panel - CBC with Differential/Platelet  2. Essential hypertension - No change in medication  - CBC with Differential/Platelet; Future - Comprehensive metabolic panel; Future - Lipid panel; Future - TSH; Future - Hemoglobin A1c; Future - Hemoglobin A1c - TSH - Lipid panel - Comprehensive metabolic panel - CBC with Differential/Platelet  3. Dyslipidemia -Continue Zetia and aspirin - CBC with Differential/Platelet; Future - Comprehensive metabolic panel; Future - Lipid panel; Future - TSH; Future - Hemoglobin A1c; Future - Hemoglobin A1c - TSH - Lipid panel - Comprehensive metabolic panel - CBC with Differential/Platelet  4. Chronic diastolic CHF (congestive heart failure) (HCC) -Continue with plan  of care by cardiology - CBC with Differential/Platelet; Future - Comprehensive metabolic panel; Future - Lipid panel; Future - TSH; Future - Hemoglobin A1c; Future - Hemoglobin A1c - TSH - Lipid panel - Comprehensive metabolic panel - CBC with Differential/Platelet  5. OAB (overactive bladder) -We will trial her on Ditropan.  We did review side effects of this medication.  She was advised to follow-up in 2 weeks to let me know how she is doing - oxybutynin (DITROPAN XL) 5 MG 24 hr tablet; Take 1 tablet (5 mg total) by mouth at bedtime.  Dispense: 30 tablet; Refill: 1  [name], NP

## 2019-12-01 NOTE — Progress Notes (Signed)
[place] - 86 y.o. female MRN [id number]  Date of birth: 1923/12/09  Office Visit Note: Visit Date: 11/30/2019 PCP: [name], NP Referred by: [name], NP  Subjective: Chief Complaint  Patient presents with  . Lower Back - Pain   HPI: Sara Garza is a 86 y.o. female who comes in today As an essentially new patient consultation self-referral.  We did see the patient on one occasion about 4 years ago through referral from Dr. Teresa Coombs.  At that time patient had had a history of chronic long-term low back pain some referral in the left hip and leg at the time we completed lumbar epidural injection which was not very successful at the time although it did provide some temporary relief.  She reports to me today and her daughter is present and provides some of the history that her primary care provider ultimately sent her to [facility] now [facility] and since that time she has been seen Dr. Suella Broad.  She has had a prior right total hip replacement by Dr. Alvan Dame at that office.  She tells me that she did attempt to get notes from their office and that they should be sending those to me.  Unfortunately not have those today.  I can see where she had extensive physical therapy all through last year at their office.  I can also see where Dr. Nelva Bush did see her on several occasions and diagnosed her with lumbar stenosis and lumbar spondylolisthesis and radiculopathy.  She reports injections from him at the surgery center.  She states to me that he has the ability to do the injections in the office but has a table that will not move and she cannot get on the table at the office.  She really does not like going to the surgery center and her injections were not helping very much for very long although she did have a couple that were several months of relief.  Her daughter confirms that at least on 1 occasion or so she would have really good results and then not as  good results which I did tell him is quite normal unfortunately.  She has had an MRI of the lumbar spine since I have seen her but I do not have the report or the images.  I did request that the daughter try to get the images to [place] at some point.  Patient has had severe back and leg pain for quite some time and now endorses 6 out of 10 pain on average is worse with standing and walking better with sitting at rest.  Her symptoms are constant at times across the back.  She gets some relief with Tylenol at times and other medication.  She cannot tolerate gabapentin as it made her too drowsy.  She has undergone extensive physical therapy.  She continues to see Dr. Lovena Le a chiropractor in town.  She reports that it severely affects her daily living.  She describes the pain is low back pain and then bilateral buttock pain sometimes she gets pain in the front of the thighs as well.  She does clarify to me that she had 2 injections by Dr. Nelva Bush in total.  She denies any focal weakness any bowel or bladder changes any recent trauma.  She has never had back surgery.  Review of Systems  Musculoskeletal: Positive for back pain and joint pain.       Radicular leg pain  All  other systems reviewed and are negative.  Otherwise per HPI.  Assessment & Plan: Visit Diagnoses:  1. Chronic bilateral low back pain with bilateral sciatica   2. Lumbar radiculopathy   3. Spondylolisthesis of thoracolumbar region   4. Diabetic polyneuropathy associated with type 2 diabetes mellitus (HCC)     Plan: Findings:  Chronic recalcitrant low back and bilateral hip and leg pain left somewhat more than right historically.  I had seen the patient in the past just on one occasion to complete an epidural injection for Dr. Teresa Coombs.  At the time we did not have MRI of any stenosis but we did have x-ray evidence of lumbar listhesis of L4 on L5 with pretty extensive facet arthropathy.  Assumption was that she did have stenosis.  Since  that time she has gone on to be treated by Dr. Suella Broad.  She has had 2 injections by him that sound like maybe they were interlaminar injections by her report.  His notes may be on the way to [place] but we will go ahead and request those again.  Have also requested that the patient's daughter obtain the MRI images so we can look at those.  She has had an MRI at their office.  She has had extensive physical therapy as well as chiropractic care by Dr. Lovena Le.  She has had medication management does not tolerate gabapentin but does use Tylenol.  Given all the factors above complicated by diabetic neuropathy and heart failure, I think the best approach is bilateral L4 transforaminal epidural steroid injection for the likely area of stenosis.  We can do that without obtaining the records although I do want to review those when we can get him.  We can obviously do this in the office instead of the surgery center and I think she will be able to get on the table here.  Depending on relief with that could look at facet joint blocks for back pain as well as ablation.  She will continue with current treatment by her chiropractor.    Meds & Orders: No orders of the defined types were placed in this encounter.  No orders of the defined types were placed in this encounter.   Follow-up: Return for Bilateral L4 transforaminal epidural steroid injection.   Procedures: No procedures performed  No notes on file   Clinical History: CLINICAL DATA:  Low back pain.  No fall.  No injury.  EXAM: LUMBAR SPINE - 2-3 VIEW  COMPARISON:  07/31/2013.  FINDINGS: Lumbar spine scoliosis concave left with diffuse degenerative change . No acute bony abnormality. 11 mm anterolisthesis L4 on L5. Stable calcific and hilar Aortoiliac atherosclerotic vascular disease. Right hip replacement.  IMPRESSION: 1. Diffuse multilevel degenerative change.  2.  11 mm anterolisthesis L4 on L5.  3. Aortoiliac atherosclerotic  vascular disease.   Electronically Signed   By: Marcello Moores  Register   On: 08/12/2016 14:56   She reports that she is a non-smoker but has been exposed to tobacco smoke. She has never used smokeless tobacco. No results for input(s): HGBA1C, LABURIC in the last 8760 hours.  Objective:  VS:  HT:    WT:   BMI:     BP:126/72  HR:99bpm  TEMP: ( )  RESP:  Physical Exam Vitals and nursing note reviewed.  Constitutional:      General: She is not in acute distress.    Appearance: Normal appearance. She is well-developed. She is not ill-appearing.  HENT:  Head: Normocephalic and atraumatic.     Right Ear: External ear normal.     Left Ear: External ear normal.     Nose: No congestion.  Eyes:     Conjunctiva/sclera: Conjunctivae normal.     Pupils: Pupils are equal, round, and reactive to light.  Cardiovascular:     Rate and Rhythm: Normal rate.     Pulses: Normal pulses.  Pulmonary:     Effort: Pulmonary effort is normal.  Abdominal:     General: There is no distension.     Palpations: Abdomen is soft.     Tenderness: There is no abdominal tenderness.  Musculoskeletal:     Right lower leg: Edema present.     Left lower leg: Edema present.     Comments: Patient ambulates with a cane with a somewhat wide-based gait with a forward flexed lumbar spine.  She has pain with extension and facet loading.  She has no focal trigger points.  No pain over the greater trochanters.  No pain with hip rotation.  She has good strength in the lower extremities bilaterally both proximal and distal.  She has no clonus.  Skin:    General: Skin is warm and dry.     Findings: No erythema or rash.  Neurological:     General: No focal deficit present.     Mental Status: She is alert and oriented to person, place, and time.     Sensory: No sensory deficit.     Motor: No weakness or abnormal muscle tone.     Coordination: Coordination normal.     Gait: Gait abnormal.  Psychiatric:        Mood and  Affect: Mood normal.        Behavior: Behavior normal.     Ortho Exam  Imaging: No results found.  Past Medical/Family/Surgical/Social History: Medications & Allergies reviewed per EMR, new medications updated. Patient Active Problem List   Diagnosis Date Noted  . Chronic diastolic CHF (congestive heart failure) ([place]) 02/25/2018  . S/P [name] (transcatheter aortic valve replacement) 02/25/2018  . Leg pain   . Hyperlipidemia   . Glaucoma   . Carotid stenosis, left   . Arthritis   . Urinary incontinence 02/12/2017  . Chronic bilateral low back pain 08/12/2016  . Spondylosis of lumbar joint 08/12/2016  . Bilateral leg pain 08/12/2016  . Neck mass 04/15/2015  . Status post hip hemiarthroplasty 07/31/2013  . CAD (coronary artery disease) 07/31/2013  . CKD (chronic kidney disease), stage III 07/31/2013  . Left bundle branch block 07/31/2013  . Severe calcific aortic stenosis 06/18/2010  . Diabetic polyneuropathy ([place]) 04/16/2010  . GLAUCOMA 01/06/2009  . CHEST PAIN, ATYPICAL 12/16/2008  . Bilateral shoulder pain 11/16/2007  . OSTEOARTHRITIS 02/02/2007  . Type 2 diabetes mellitus with renal manifestations, controlled ([place]) 10/24/2006  . Dyslipidemia 10/02/2006  . Essential hypertension 10/02/2006   Past Medical History:  Diagnosis Date  . Arthritis   . CAD (coronary artery disease)   . Carotid stenosis, left    50-60% stable  . Diabetes mellitus   . Glaucoma   . Hyperlipidemia   . Hypertension   . Leg pain    ABIs 2/18: normal bilaterally.  . Osteoporosis   . Valvular heart disease    Aortic Stenosis s/p [name] 2014 // Mitral stenosis // Echo 09/2018: EF > 65, mod LVH, severe focal basal hypertrophy, RVSP 39.8, mild to mod MR, mod to severe MS (mean 9), AVR with mild AI, severe LAE (  similar to prior echo)   Family History  Problem Relation Age of Onset  . COPD Father   . Cancer Father        lung cancer  . Lung cancer Other    Past Surgical History:  Procedure  Laterality Date  . ANOMALOUS PULMONARY VENOUS RETURN REPAIR, TOTAL    . APPENDECTOMY    . CARDIAC VALVE REPLACEMENT  04/29/2012  . Cataracts Bilateral   . CESAREAN SECTION    . FOOT SURGERY     [name]  . HIP ARTHROPLASTY Right 07/31/2013   Procedure: ARTHROPLASTY BIPOLAR HIP;  Surgeon: [name], MD;  Location: [facility];  Service: Orthopedics;  Laterality: Right;  . PERCUTANEOUS CORONARY STENT INTERVENTION (PCI-S) N/A 01/02/2012   Procedure: PERCUTANEOUS CORONARY STENT INTERVENTION (PCI-S);  Surgeon: Sherren Mocha, MD;  Location: [facility] CATH LAB;  Service: Cardiovascular;  Laterality: N/A;   Social History   Occupational History  . Not on file  Tobacco Use  . Smoking status: Passive Smoke Exposure - Never Smoker  . Smokeless tobacco: Never Used  Substance and Sexual Activity  . Alcohol use: Yes    Alcohol/week: 0.0 standard drinks    Comment: very seldom  . Drug use: No  . Sexual activity: Not Currently

## 2019-12-02 ENCOUNTER — Other Ambulatory Visit: Payer: Self-pay

## 2019-12-02 ENCOUNTER — Ambulatory Visit: Payer: Self-pay

## 2019-12-02 ENCOUNTER — Ambulatory Visit (INDEPENDENT_AMBULATORY_CARE_PROVIDER_SITE_OTHER): Payer: Medicare Other | Admitting: Physical Medicine and Rehabilitation

## 2019-12-02 ENCOUNTER — Encounter: Payer: Self-pay | Admitting: Physical Medicine and Rehabilitation

## 2019-12-02 VITALS — BP 141/65 | HR 83

## 2019-12-02 DIAGNOSIS — M4315 Spondylolisthesis, thoracolumbar region: Secondary | ICD-10-CM

## 2019-12-02 DIAGNOSIS — M48062 Spinal stenosis, lumbar region with neurogenic claudication: Secondary | ICD-10-CM | POA: Diagnosis not present

## 2019-12-02 DIAGNOSIS — M5416 Radiculopathy, lumbar region: Secondary | ICD-10-CM

## 2019-12-02 LAB — CBC WITH DIFFERENTIAL/PLATELET
Absolute Monocytes: 498 cells/uL (ref 200–950)
Basophils Absolute: 42 cells/uL (ref 0–200)
Basophils Relative: 0.8 %
Eosinophils Absolute: 249 cells/uL (ref 15–500)
Eosinophils Relative: 4.7 %
HCT: 32.9 % — ABNORMAL LOW (ref 35.0–45.0)
Hemoglobin: 11 g/dL — ABNORMAL LOW (ref 11.7–15.5)
Lymphs Abs: 1065 cells/uL (ref 850–3900)
MCH: 30.8 pg (ref 27.0–33.0)
MCHC: 33.4 g/dL (ref 32.0–36.0)
MCV: 92.2 fL (ref 80.0–100.0)
MPV: 11.3 fL (ref 7.5–12.5)
Monocytes Relative: 9.4 %
Neutro Abs: 3445 cells/uL (ref 1500–7800)
Neutrophils Relative %: 65 %
Platelets: 224 10*3/uL (ref 140–400)
RBC: 3.57 10*6/uL — ABNORMAL LOW (ref 3.80–5.10)
RDW: 12.9 % (ref 11.0–15.0)
Total Lymphocyte: 20.1 %
WBC: 5.3 10*3/uL (ref 3.8–10.8)

## 2019-12-02 LAB — LIPID PANEL
Cholesterol: 203 mg/dL — ABNORMAL HIGH (ref <200)
HDL: 42 mg/dL — ABNORMAL LOW (ref >=50)
LDL Cholesterol (Calc): 133 mg/dL (calc) — ABNORMAL HIGH
Non-HDL Cholesterol (Calc): 161 mg/dL (calc) — ABNORMAL HIGH (ref <130)
Total CHOL/HDL Ratio: 4.8 (calc) (ref <5.0)
Triglycerides: 149 mg/dL (ref <150)

## 2019-12-02 LAB — HEMOGLOBIN A1C
Hgb A1c MFr Bld: 5.7 % of total Hgb — ABNORMAL HIGH (ref <5.7)
Mean Plasma Glucose: 117 (calc)
eAG (mmol/L): 6.5 (calc)

## 2019-12-02 LAB — COMPREHENSIVE METABOLIC PANEL
AG Ratio: 1.3 (calc) (ref 1.0–2.5)
ALT: 9 U/L (ref 6–29)
AST: 13 U/L (ref 10–35)
Albumin: 3.9 g/dL (ref 3.6–5.1)
Alkaline phosphatase (APISO): 64 U/L (ref 37–153)
BUN/Creatinine Ratio: 24 (calc) — ABNORMAL HIGH (ref 6–22)
BUN: 35 mg/dL — ABNORMAL HIGH (ref 7–25)
CO2: 26 mmol/L (ref 20–32)
Calcium: 9 mg/dL (ref 8.6–10.4)
Chloride: 107 mmol/L (ref 98–110)
Creat: 1.44 mg/dL — ABNORMAL HIGH (ref 0.60–0.88)
Globulin: 2.9 g/dL (calc) (ref 1.9–3.7)
Glucose, Bld: 96 mg/dL (ref 65–99)
Potassium: 4.5 mmol/L (ref 3.5–5.3)
Sodium: 141 mmol/L (ref 135–146)
Total Bilirubin: 0.6 mg/dL (ref 0.2–1.2)
Total Protein: 6.8 g/dL (ref 6.1–8.1)

## 2019-12-02 LAB — TSH: TSH: 1.6 mIU/L (ref 0.40–4.50)

## 2019-12-02 MED ORDER — METHYLPREDNISOLONE ACETATE 80 MG/ML IJ SUSP
80.0000 mg | Freq: Once | INTRAMUSCULAR | Status: AC
  Administered 2019-12-02: 80 mg

## 2019-12-02 NOTE — Progress Notes (Signed)
Pt states Lower back pain the traveling to bit legs. Pt state sitting, walking and standing makes the worse. Pt state pain pills helps a little with pain.   Numeric Pain Rating Scale and Functional Assessment Average Pain 8   In the last MONTH (on 0-10 scale) has pain interfered with the following?  1. General activity like being  able to carry out your everyday physical activities such as walking, climbing stairs, carrying groceries, or moving a chair?  Rating(7)   +Driver, -BT, -Dye Allergies.

## 2019-12-03 ENCOUNTER — Telehealth: Payer: Self-pay | Admitting: Adult Health

## 2019-12-03 NOTE — Telephone Encounter (Signed)
Pt call and stated she would like a call back about her labs.

## 2019-12-07 ENCOUNTER — Telehealth: Payer: Self-pay | Admitting: Physical Medicine and Rehabilitation

## 2019-12-07 NOTE — Telephone Encounter (Signed)
Patient called. She would like to know if PT would help her. Her call back number is [phone]

## 2019-12-08 NOTE — Telephone Encounter (Signed)
I think a good course of physical therapy would always help somebody particularly with their core strengthening and balance and mobility.  It may not directly help the stenosis.  If she wants [place] to put in a referral we can do that.  Does she have any preferences for place for doing the physical therapy.  We do have it here.

## 2019-12-08 NOTE — Telephone Encounter (Signed)
Please advise 

## 2019-12-08 NOTE — Telephone Encounter (Signed)
Patient wants to wait until she gives the injection a little more time to see if she gets any relief from that. She will call us back early next week. She states that she has tried PT before and it has not really helped.

## 2019-12-10 DIAGNOSIS — M9903 Segmental and somatic dysfunction of lumbar region: Secondary | ICD-10-CM | POA: Diagnosis not present

## 2019-12-10 DIAGNOSIS — M9902 Segmental and somatic dysfunction of thoracic region: Secondary | ICD-10-CM | POA: Diagnosis not present

## 2019-12-10 DIAGNOSIS — M9901 Segmental and somatic dysfunction of cervical region: Secondary | ICD-10-CM | POA: Diagnosis not present

## 2019-12-10 DIAGNOSIS — M9905 Segmental and somatic dysfunction of pelvic region: Secondary | ICD-10-CM | POA: Diagnosis not present

## 2019-12-10 DIAGNOSIS — M546 Pain in thoracic spine: Secondary | ICD-10-CM | POA: Diagnosis not present

## 2019-12-10 DIAGNOSIS — M542 Cervicalgia: Secondary | ICD-10-CM | POA: Diagnosis not present

## 2019-12-18 NOTE — Procedures (Signed)
Lumbosacral Transforaminal Epidural Steroid Injection - Sub-Pedicular Approach with Fluoroscopic Guidance  Patient: Sara Garza      Date of Birth: 1923/12/12 MRN: [id number] PCP: [name], NP      Visit Date: 12/02/2019   Universal Protocol:    Date/Time: 12/02/2019  Consent Given By: the patient  Position: PRONE  Additional Comments: Vital signs were monitored before and after the procedure. Patient was prepped and draped in the usual sterile fashion. The correct patient, procedure, and site was verified.   Injection Procedure Details:  Procedure Site One Meds Administered:  Meds ordered this encounter  Medications  . methylPREDNISolone acetate (DEPO-MEDROL) injection 80 mg    Laterality: Bilateral  Location/Site:  L4-L5  Needle size: 22 G  Needle type: Spinal  Needle Placement: Transforaminal  Findings:    -Comments: Excellent flow of contrast along the nerve, nerve root and into the epidural space.  Procedure Details: After squaring off the end-plates to get a true AP view, the C-arm was positioned so that an oblique view of the foramen as noted above was visualized. The target area is just inferior to the "nose of the scotty dog" or sub pedicular. The soft tissues overlying this structure were infiltrated with 2-3 ml. of 1% Lidocaine without Epinephrine.  The spinal needle was inserted toward the target using a "trajectory" view along the fluoroscope beam.  Under AP and lateral visualization, the needle was advanced so it did not puncture dura and was located close the 6 O'Clock position of the pedical in AP tracterory. Biplanar projections were used to confirm position. Aspiration was confirmed to be negative for CSF and/or blood. A 1-2 ml. volume of Isovue-250 was injected and flow of contrast was noted at each level. Radiographs were obtained for documentation purposes.   After attaining the desired flow of contrast documented above, a 0.5 to 1.0 ml test  dose of 0.25% Marcaine was injected into each respective transforaminal space.  The patient was observed for 90 seconds post injection.  After no sensory deficits were reported, and normal lower extremity motor function was noted,   the above injectate was administered so that equal amounts of the injectate were placed at each foramen (level) into the transforaminal epidural space.   Additional Comments:  The patient tolerated the procedure well Dressing: 2 x 2 sterile gauze and Band-Aid    Post-procedure details: Patient was observed during the procedure. Post-procedure instructions were reviewed.  Patient left the clinic in stable condition.

## 2019-12-18 NOTE — Progress Notes (Signed)
[place] - 86 y.o. female MRN [id number]  Date of birth: 05-26-1923  Office Visit Note: Visit Date: 12/02/2019 PCP: [name], NP Referred by: [name], NP  Subjective: Chief Complaint  Patient presents with  . Lower Back - Pain  . Left Leg - Pain  . Right Leg - Pain   HPI:  Sara Garza is a 86 y.o. female who comes in today at the request of Dr. Laurence Spates for planned Bilateral L4-L5 Lumbar epidural steroid injection with fluoroscopic guidance.  The patient has failed conservative care including home exercise, medications, time and activity modification.  This injection will be diagnostic and hopefully therapeutic.  Please see requesting physician notes for further details and justification.   Since have last seen her we have obtained notes from Dr. Suella Broad at [facility].  This did show the MRI that was obtained by them showing significant lumbar stenosis multifactorial with almost grade 2 listhesis of L4 on 5.  It also revealed that the injections he performed were epidural injections from interlaminar approach at L5-S1.  ROS Otherwise per HPI.  Assessment & Plan: Visit Diagnoses:  1. Lumbar radiculopathy   2. Spinal stenosis of lumbar region with neurogenic claudication   3. Spondylolisthesis of thoracolumbar region     Plan: No additional findings.   Meds & Orders:  Meds ordered this encounter  Medications  . methylPREDNISolone acetate (DEPO-MEDROL) injection 80 mg    Orders Placed This Encounter  Procedures  . XR C-ARM NO REPORT  . Epidural Steroid injection    Follow-up: Return if symptoms worsen or fail to improve.   Procedures: No procedures performed  Lumbosacral Transforaminal Epidural Steroid Injection - Sub-Pedicular Approach with Fluoroscopic Guidance  Patient: Sara Garza      Date of Birth: 07/07/1923 MRN: [id number] PCP: [name], NP      Visit Date: 12/02/2019   Universal Protocol:    Date/Time:  12/02/2019  Consent Given By: the patient  Position: PRONE  Additional Comments: Vital signs were monitored before and after the procedure. Patient was prepped and draped in the usual sterile fashion. The correct patient, procedure, and site was verified.   Injection Procedure Details:  Procedure Site One Meds Administered:  Meds ordered this encounter  Medications  . methylPREDNISolone acetate (DEPO-MEDROL) injection 80 mg    Laterality: Bilateral  Location/Site:  L4-L5  Needle size: 22 G  Needle type: Spinal  Needle Placement: Transforaminal  Findings:    -Comments: Excellent flow of contrast along the nerve, nerve root and into the epidural space.  Procedure Details: After squaring off the end-plates to get a true AP view, the C-arm was positioned so that an oblique view of the foramen as noted above was visualized. The target area is just inferior to the "nose of the scotty dog" or sub pedicular. The soft tissues overlying this structure were infiltrated with 2-3 ml. of 1% Lidocaine without Epinephrine.  The spinal needle was inserted toward the target using a "trajectory" view along the fluoroscope beam.  Under AP and lateral visualization, the needle was advanced so it did not puncture dura and was located close the 6 O'Clock position of the pedical in AP tracterory. Biplanar projections were used to confirm position. Aspiration was confirmed to be negative for CSF and/or blood. A 1-2 ml. volume of Isovue-250 was injected and flow of contrast was noted at each level. Radiographs were obtained for documentation purposes.   After attaining the desired flow of contrast  documented above, a 0.5 to 1.0 ml test dose of 0.25% Marcaine was injected into each respective transforaminal space.  The patient was observed for 90 seconds post injection.  After no sensory deficits were reported, and normal lower extremity motor function was noted,   the above injectate was administered  so that equal amounts of the injectate were placed at each foramen (level) into the transforaminal epidural space.   Additional Comments:  The patient tolerated the procedure well Dressing: 2 x 2 sterile gauze and Band-Aid    Post-procedure details: Patient was observed during the procedure. Post-procedure instructions were reviewed.  Patient left the clinic in stable condition.      Clinical History: We did receive MRI of the lumbar spine performed that [facility] in 2020  This did show almost a grade 2 listhesis of L4 on L5 with severe multifactorial stenosis at this level and severe stenosis at L3-4.  ----------------------  CLINICAL DATA: Low back pain. No fall. No injury.    EXAM:  LUMBAR SPINE - 2-3 VIEW    COMPARISON: 07/31/2013.    FINDINGS:  Lumbar spine scoliosis concave left with diffuse degenerative change  . No acute bony abnormality. 11 mm anterolisthesis L4 on L5. Stable  calcific and hilar Aortoiliac atherosclerotic vascular disease.  Right hip replacement.    IMPRESSION:  1. Diffuse multilevel degenerative change.    2. 11 mm anterolisthesis L4 on L5.    3. Aortoiliac atherosclerotic vascular disease.      Electronically Signed  By: Marcello Moores Register  On: 08/12/2016 14:56     Objective:  VS:  HT:    WT:   BMI:     BP:(!) 141/65  HR:83bpm  TEMP: ( )  RESP:  Physical Exam Constitutional:      General: She is not in acute distress.    Appearance: Normal appearance. She is not ill-appearing.  HENT:     Head: Normocephalic and atraumatic.     Right Ear: External ear normal.     Left Ear: External ear normal.  Eyes:     Extraocular Movements: Extraocular movements intact.  Cardiovascular:     Rate and Rhythm: Normal rate.     Pulses: Normal pulses.  Musculoskeletal:     Right lower leg: No edema.     Left lower leg: No edema.     Comments: Patient has good distal strength with no pain over the greater trochanters.   No clonus or focal weakness.  Skin:    Findings: No erythema, lesion or rash.  Neurological:     General: No focal deficit present.     Mental Status: She is alert and oriented to person, place, and time.     Sensory: No sensory deficit.     Motor: No weakness or abnormal muscle tone.     Coordination: Coordination normal.  Psychiatric:        Mood and Affect: Mood normal.        Behavior: Behavior normal.      Imaging: No results found.

## 2019-12-20 ENCOUNTER — Telehealth: Payer: Self-pay

## 2019-12-20 NOTE — Telephone Encounter (Signed)
Patient called she wants to give an update on epidural injection. call [phone].

## 2019-12-20 NOTE — Progress Notes (Signed)
Chronic Care Management Pharmacy Assistant   Name: TYSHAUNA FINKBINER  MRN: [id number] DOB: [birth year]  Reason for Encounter: Hypertension  Disease State Call.   PCP : [name], NP  Allergies:   Allergies  Allergen Reactions  . Statins Other (See Comments)    Muscle aches  . Gabapentin Other (See Comments)    SOMNOLENCE    Medications: Outpatient Encounter Medications as of 12/20/2019  Medication Sig  . amLODipine (NORVASC) 5 MG tablet TAKE 1 AND 1/2 TABLETS(7.5 MG) BY MOUTH DAILY  . amoxicillin (AMOXIL) 500 MG capsule TK 4 CAPSULES PO 1 HOUR PRIOR TO DENTAL PROCEDURES  . aspirin EC 81 MG tablet Take 1 tablet (81 mg total) by mouth daily. Start after lovenox injections are done after 2 weeks  . bimatoprost (LUMIGAN) 0.01 % SOLN Place 1 drop into both eyes at bedtime.   . Calcium Carb-Cholecalciferol (CALCIUM 500 +D) 500-400 MG-UNIT TABS Take 1 tablet by mouth daily.   . carvedilol (COREG) 3.125 MG tablet TAKE 1 TABLET(3.125 MG) BY MOUTH TWICE DAILY WITH A MEAL  . ezetimibe (ZETIA) 10 MG tablet TAKE 1 TABLET(10 MG) BY MOUTH DAILY  . furosemide (LASIX) 20 MG tablet Take 1 tablet (20 mg total) by mouth daily.  . isosorbide mononitrate (IMDUR) 60 MG 24 hr tablet TAKE 1 TABLET(60 MG) BY MOUTH DAILY  . losartan (COZAAR) 25 MG tablet TAKE 1 TABLET(25 MG) BY MOUTH DAILY  . Multiple Vitamin (MULTIVITAMIN) tablet Take 1 tablet by mouth daily.    . Multiple Vitamins-Minerals (PRESERVISION AREDS 2+MULTI VIT PO) Take 2 capsules by mouth in the morning and at bedtime.  [name] oxybutynin (DITROPAN-XL) 5 MG 24 hr tablet TAKE 1 TABLET(5 MG) BY MOUTH AT BEDTIME   No facility-administered encounter medications on file as of 12/20/2019.    Current Diagnosis: Patient Active Problem List   Diagnosis Date Noted  . Chronic diastolic CHF (congestive heart failure) ([place]) 02/25/2018  . S/P [name] (transcatheter aortic valve replacement) 02/25/2018  . Leg pain   . Hyperlipidemia   . Glaucoma   .  Carotid stenosis, left   . Arthritis   . Urinary incontinence 02/12/2017  . Chronic bilateral low back pain 08/12/2016  . Spondylosis of lumbar joint 08/12/2016  . Bilateral leg pain 08/12/2016  . Neck mass 04/15/2015  . Status post hip hemiarthroplasty 07/31/2013  . CAD (coronary artery disease) 07/31/2013  . CKD (chronic kidney disease), stage III 07/31/2013  . Left bundle branch block 07/31/2013  . Severe calcific aortic stenosis 06/18/2010  . Diabetic polyneuropathy ([place]) 04/16/2010  . GLAUCOMA 01/06/2009  . CHEST PAIN, ATYPICAL 12/16/2008  . Bilateral shoulder pain 11/16/2007  . OSTEOARTHRITIS 02/02/2007  . Type 2 diabetes mellitus with renal manifestations, controlled ([place]) 10/24/2006  . Dyslipidemia 10/02/2006  . Essential hypertension 10/02/2006     Follow-Up:  Pharmacist Review   Reviewed chart prior to disease state call. Spoke with patient regarding BP  Recent Office Vitals: BP Readings from Last 3 Encounters:  12/02/19 (!) 141/65  12/01/19 (!) 124/58  11/30/19 126/72   Pulse Readings from Last 3 Encounters:  12/02/19 83  12/01/19 80  11/30/19 99    Wt Readings from Last 3 Encounters:  12/01/19 158 lb 3.2 oz (71.8 kg)  10/14/19 157 lb 12.8 oz (71.6 kg)  09/07/19 157 lb (71.2 kg)     Kidney Function Lab Results  Component Value Date/Time   CREATININE 1.44 (H) 12/01/2019 10:14 AM   CREATININE 1.33 (H) 05/03/2019 11:59 AM  CREATININE 1.36 (H) 11/26/2018 12:01 PM   CREATININE 1.56 (H) 08/17/2015 09:28 AM   GFR 36.25 (L) 11/26/2018 12:01 PM   GFRNONAA 34 (L) 05/03/2019 11:59 AM   GFRAA 40 (L) 05/03/2019 11:59 AM    BMP Latest Ref Rng & Units 12/01/2019 05/03/2019 11/26/2018  Glucose 65 - 99 mg/dL 96 76 87  BUN 7 - 25 mg/dL 35(H) 33 37(H)  Creatinine 0.60 - 0.88 mg/dL 1.44(H) 1.33(H) 1.36(H)  BUN/Creat Ratio 6 - 22 (calc) 24(H) 25 -  Sodium 135 - 146 mmol/L 141 146(H) 142  Potassium 3.5 - 5.3 mmol/L 4.5 4.6 4.5  Chloride 98 - 110 mmol/L 107 106 107    CO2 20 - 32 mmol/L 26 26 27   Calcium 8.6 - 10.4 mg/dL 9.0 9.2 9.0    . Current antihypertensive regimen:  o Amlodipine 5 MG Tablet o Carvedilol 3.125 MG Tablet o Losartan 25 mg Daily o Isosorbide Mononitrate 60 Mg Tablet . How often are you checking your Blood Pressure? infrequently . Current home BP readings:  o Patient states she only check her blood pressure once in awhile. Patient states she does not keep a log of her readings. . What recent interventions/DTPs have been made by any provider to improve Blood Pressure control since last CPP Visit: No . Any recent hospitalizations or ED visits since last visit with CPP? No . What diet changes have been made to improve Blood Pressure Control?  o Patient states she cook occasionally with salt added. . What exercise is being done to improve your Blood Pressure Control?  o Patient reports she does not exercise because she can barley walk.   Adherence Review: Is the patient currently on ACE/ARB medication? Yes Does the patient have >5 day gap between last estimated fill dates? Yes  [place] Pharmacist Assistant [phone]   Maryjean Ka

## 2019-12-21 NOTE — Telephone Encounter (Signed)
Returned patient's call and left message #1. 

## 2019-12-22 ENCOUNTER — Telehealth: Payer: Self-pay | Admitting: Physical Medicine and Rehabilitation

## 2019-12-22 NOTE — Telephone Encounter (Signed)
Patient called returning Courtney's call. Patient is asking for a call back. Patient states she will be home for the remainder of the day. Patient phone number is [phone].

## 2019-12-23 NOTE — Telephone Encounter (Signed)
Left message #2

## 2019-12-23 NOTE — Telephone Encounter (Signed)
See previous message

## 2019-12-24 ENCOUNTER — Telehealth: Payer: Self-pay | Admitting: Physical Medicine and Rehabilitation

## 2019-12-24 DIAGNOSIS — M546 Pain in thoracic spine: Secondary | ICD-10-CM | POA: Diagnosis not present

## 2019-12-24 DIAGNOSIS — M9903 Segmental and somatic dysfunction of lumbar region: Secondary | ICD-10-CM | POA: Diagnosis not present

## 2019-12-24 DIAGNOSIS — M9905 Segmental and somatic dysfunction of pelvic region: Secondary | ICD-10-CM | POA: Diagnosis not present

## 2019-12-24 DIAGNOSIS — M9901 Segmental and somatic dysfunction of cervical region: Secondary | ICD-10-CM | POA: Diagnosis not present

## 2019-12-24 DIAGNOSIS — M9902 Segmental and somatic dysfunction of thoracic region: Secondary | ICD-10-CM | POA: Diagnosis not present

## 2019-12-24 DIAGNOSIS — M542 Cervicalgia: Secondary | ICD-10-CM | POA: Diagnosis not present

## 2019-12-24 NOTE — Telephone Encounter (Signed)
Pt called stating she's been in a phone tag with courtney for about a week and she would like for courtney to give her a cal after 4 today because she will be available   [phone]

## 2019-12-24 NOTE — Telephone Encounter (Signed)
Try L5-S1 interlam, double BT

## 2019-12-24 NOTE — Telephone Encounter (Signed)
Patient reports that she had no relief from her bilateral L4 TF on 9/2. Please advise.

## 2019-12-27 ENCOUNTER — Telehealth: Payer: Self-pay

## 2019-12-27 DIAGNOSIS — Z23 Encounter for immunization: Secondary | ICD-10-CM | POA: Diagnosis not present

## 2019-12-27 NOTE — Telephone Encounter (Signed)
Patient called she is returning your phone call. Call [phone]

## 2019-12-27 NOTE — Telephone Encounter (Signed)
Called patient to schedule an L5-S1 IL. Patient will call her daughter to see what days work best. Dates and times offered: 10/6 at 1315 or 1515; 10/11 at 1315, 1345, 1415; 10/13 in the afternoon.

## 2019-12-27 NOTE — Telephone Encounter (Signed)
Called patient to schedule. No answer after multiple rings and no voicemail.

## 2019-12-28 ENCOUNTER — Telehealth: Payer: Self-pay | Admitting: Physical Medicine and Rehabilitation

## 2019-12-28 NOTE — Telephone Encounter (Signed)
Patient called back. I returned her call, but the line is busy.

## 2019-12-28 NOTE — Telephone Encounter (Signed)
See previous message

## 2019-12-28 NOTE — Telephone Encounter (Signed)
Patient called and requested for Courtney to call back. Patient phone number is [phone].

## 2019-12-29 NOTE — Telephone Encounter (Signed)
Scheduled for 10/13 at 1345 with driver and no blood thinners.

## 2019-12-29 NOTE — Telephone Encounter (Signed)
Called again and left message

## 2019-12-30 ENCOUNTER — Inpatient Hospital Stay (HOSPITAL_COMMUNITY)
Admission: EM | Admit: 2019-12-30 | Discharge: 2020-01-04 | DRG: 480 | Disposition: A | Discharge status: Skilled Nursing Facility | Payer: Medicare Other | Attending: Internal Medicine | Admitting: Internal Medicine

## 2019-12-30 ENCOUNTER — Encounter (HOSPITAL_COMMUNITY): Payer: Self-pay

## 2019-12-30 ENCOUNTER — Other Ambulatory Visit: Payer: Self-pay

## 2019-12-30 ENCOUNTER — Emergency Department (HOSPITAL_COMMUNITY): Payer: Medicare Other

## 2019-12-30 DIAGNOSIS — Z792 Long term (current) use of antibiotics: Secondary | ICD-10-CM | POA: Diagnosis not present

## 2019-12-30 DIAGNOSIS — I130 Hypertensive heart and chronic kidney disease with heart failure and stage 1 through stage 4 chronic kidney disease, or unspecified chronic kidney disease: Secondary | ICD-10-CM | POA: Diagnosis present

## 2019-12-30 DIAGNOSIS — M810 Age-related osteoporosis without current pathological fracture: Secondary | ICD-10-CM | POA: Diagnosis present

## 2019-12-30 DIAGNOSIS — S72009A Fracture of unspecified part of neck of unspecified femur, initial encounter for closed fracture: Secondary | ICD-10-CM | POA: Diagnosis not present

## 2019-12-30 DIAGNOSIS — N183 Chronic kidney disease, stage 3 unspecified: Secondary | ICD-10-CM | POA: Diagnosis present

## 2019-12-30 DIAGNOSIS — Z7982 Long term (current) use of aspirin: Secondary | ICD-10-CM | POA: Diagnosis not present

## 2019-12-30 DIAGNOSIS — Z20822 Contact with and (suspected) exposure to covid-19: Secondary | ICD-10-CM | POA: Diagnosis present

## 2019-12-30 DIAGNOSIS — K5900 Constipation, unspecified: Secondary | ICD-10-CM | POA: Diagnosis not present

## 2019-12-30 DIAGNOSIS — E785 Hyperlipidemia, unspecified: Secondary | ICD-10-CM | POA: Diagnosis present

## 2019-12-30 DIAGNOSIS — S72142A Displaced intertrochanteric fracture of left femur, initial encounter for closed fracture: Principal | ICD-10-CM

## 2019-12-30 DIAGNOSIS — R301 Vesical tenesmus: Secondary | ICD-10-CM | POA: Diagnosis not present

## 2019-12-30 DIAGNOSIS — I2510 Atherosclerotic heart disease of native coronary artery without angina pectoris: Secondary | ICD-10-CM | POA: Diagnosis present

## 2019-12-30 DIAGNOSIS — E1122 Type 2 diabetes mellitus with diabetic chronic kidney disease: Secondary | ICD-10-CM | POA: Diagnosis present

## 2019-12-30 DIAGNOSIS — Z825 Family history of asthma and other chronic lower respiratory diseases: Secondary | ICD-10-CM | POA: Diagnosis not present

## 2019-12-30 DIAGNOSIS — I447 Left bundle-branch block, unspecified: Secondary | ICD-10-CM | POA: Diagnosis not present

## 2019-12-30 DIAGNOSIS — S72142D Displaced intertrochanteric fracture of left femur, subsequent encounter for closed fracture with routine healing: Secondary | ICD-10-CM | POA: Diagnosis not present

## 2019-12-30 DIAGNOSIS — Z4889 Encounter for other specified surgical aftercare: Secondary | ICD-10-CM | POA: Diagnosis not present

## 2019-12-30 DIAGNOSIS — Z96641 Presence of right artificial hip joint: Secondary | ICD-10-CM | POA: Diagnosis present

## 2019-12-30 DIAGNOSIS — Z79899 Other long term (current) drug therapy: Secondary | ICD-10-CM

## 2019-12-30 DIAGNOSIS — Z7901 Long term (current) use of anticoagulants: Secondary | ICD-10-CM | POA: Diagnosis not present

## 2019-12-30 DIAGNOSIS — R2681 Unsteadiness on feet: Secondary | ICD-10-CM | POA: Diagnosis not present

## 2019-12-30 DIAGNOSIS — Z4789 Encounter for other orthopedic aftercare: Secondary | ICD-10-CM | POA: Diagnosis not present

## 2019-12-30 DIAGNOSIS — H409 Unspecified glaucoma: Secondary | ICD-10-CM | POA: Diagnosis present

## 2019-12-30 DIAGNOSIS — I050 Rheumatic mitral stenosis: Secondary | ICD-10-CM | POA: Diagnosis not present

## 2019-12-30 DIAGNOSIS — S72122D Displaced fracture of lesser trochanter of left femur, subsequent encounter for closed fracture with routine healing: Secondary | ICD-10-CM | POA: Diagnosis not present

## 2019-12-30 DIAGNOSIS — M25552 Pain in left hip: Secondary | ICD-10-CM | POA: Diagnosis not present

## 2019-12-30 DIAGNOSIS — E1129 Type 2 diabetes mellitus with other diabetic kidney complication: Secondary | ICD-10-CM | POA: Diagnosis not present

## 2019-12-30 DIAGNOSIS — W010XXA Fall on same level from slipping, tripping and stumbling without subsequent striking against object, initial encounter: Secondary | ICD-10-CM | POA: Diagnosis present

## 2019-12-30 DIAGNOSIS — R41841 Cognitive communication deficit: Secondary | ICD-10-CM | POA: Diagnosis not present

## 2019-12-30 DIAGNOSIS — H5789 Other specified disorders of eye and adnexa: Secondary | ICD-10-CM

## 2019-12-30 DIAGNOSIS — R52 Pain, unspecified: Secondary | ICD-10-CM | POA: Diagnosis not present

## 2019-12-30 DIAGNOSIS — I10 Essential (primary) hypertension: Secondary | ICD-10-CM | POA: Insufficient documentation

## 2019-12-30 DIAGNOSIS — R278 Other lack of coordination: Secondary | ICD-10-CM | POA: Diagnosis not present

## 2019-12-30 DIAGNOSIS — S79929A Unspecified injury of unspecified thigh, initial encounter: Secondary | ICD-10-CM | POA: Diagnosis not present

## 2019-12-30 DIAGNOSIS — Z7722 Contact with and (suspected) exposure to environmental tobacco smoke (acute) (chronic): Secondary | ICD-10-CM | POA: Diagnosis present

## 2019-12-30 DIAGNOSIS — I5033 Acute on chronic diastolic (congestive) heart failure: Secondary | ICD-10-CM

## 2019-12-30 DIAGNOSIS — Z043 Encounter for examination and observation following other accident: Secondary | ICD-10-CM | POA: Diagnosis not present

## 2019-12-30 DIAGNOSIS — E1169 Type 2 diabetes mellitus with other specified complication: Secondary | ICD-10-CM | POA: Diagnosis present

## 2019-12-30 DIAGNOSIS — Z01810 Encounter for preprocedural cardiovascular examination: Secondary | ICD-10-CM | POA: Diagnosis not present

## 2019-12-30 DIAGNOSIS — M1990 Unspecified osteoarthritis, unspecified site: Secondary | ICD-10-CM | POA: Diagnosis present

## 2019-12-30 DIAGNOSIS — W19XXXD Unspecified fall, subsequent encounter: Secondary | ICD-10-CM | POA: Diagnosis not present

## 2019-12-30 DIAGNOSIS — Z888 Allergy status to other drugs, medicaments and biological substances status: Secondary | ICD-10-CM

## 2019-12-30 DIAGNOSIS — W19XXXA Unspecified fall, initial encounter: Secondary | ICD-10-CM | POA: Diagnosis not present

## 2019-12-30 DIAGNOSIS — I5032 Chronic diastolic (congestive) heart failure: Secondary | ICD-10-CM | POA: Diagnosis not present

## 2019-12-30 DIAGNOSIS — R5381 Other malaise: Secondary | ICD-10-CM | POA: Diagnosis not present

## 2019-12-30 DIAGNOSIS — Z955 Presence of coronary angioplasty implant and graft: Secondary | ICD-10-CM

## 2019-12-30 DIAGNOSIS — E1142 Type 2 diabetes mellitus with diabetic polyneuropathy: Secondary | ICD-10-CM | POA: Diagnosis not present

## 2019-12-30 DIAGNOSIS — Z801 Family history of malignant neoplasm of trachea, bronchus and lung: Secondary | ICD-10-CM | POA: Diagnosis not present

## 2019-12-30 DIAGNOSIS — Z952 Presence of prosthetic heart valve: Secondary | ICD-10-CM | POA: Diagnosis not present

## 2019-12-30 DIAGNOSIS — Z7401 Bed confinement status: Secondary | ICD-10-CM | POA: Diagnosis not present

## 2019-12-30 DIAGNOSIS — Z9181 History of falling: Secondary | ICD-10-CM | POA: Diagnosis not present

## 2019-12-30 DIAGNOSIS — R110 Nausea: Secondary | ICD-10-CM | POA: Diagnosis not present

## 2019-12-30 DIAGNOSIS — Z419 Encounter for procedure for purposes other than remedying health state, unspecified: Secondary | ICD-10-CM

## 2019-12-30 DIAGNOSIS — D62 Acute posthemorrhagic anemia: Secondary | ICD-10-CM | POA: Diagnosis not present

## 2019-12-30 DIAGNOSIS — M2550 Pain in unspecified joint: Secondary | ICD-10-CM | POA: Diagnosis not present

## 2019-12-30 DIAGNOSIS — N184 Chronic kidney disease, stage 4 (severe): Secondary | ICD-10-CM | POA: Diagnosis present

## 2019-12-30 DIAGNOSIS — M6281 Muscle weakness (generalized): Secondary | ICD-10-CM | POA: Diagnosis not present

## 2019-12-30 DIAGNOSIS — I350 Nonrheumatic aortic (valve) stenosis: Secondary | ICD-10-CM | POA: Diagnosis not present

## 2019-12-30 LAB — BASIC METABOLIC PANEL WITH GFR
Anion gap: 11 (ref 5–15)
BUN: 37 mg/dL — ABNORMAL HIGH (ref 8–23)
CO2: 21 mmol/L — ABNORMAL LOW (ref 22–32)
Calcium: 9 mg/dL (ref 8.9–10.3)
Chloride: 109 mmol/L (ref 98–111)
Creatinine, Ser: 1.29 mg/dL — ABNORMAL HIGH (ref 0.44–1.00)
GFR calc Af Amer: 41 mL/min — ABNORMAL LOW
GFR calc non Af Amer: 35 mL/min — ABNORMAL LOW
Glucose, Bld: 99 mg/dL (ref 70–99)
Potassium: 4.3 mmol/L (ref 3.5–5.1)
Sodium: 141 mmol/L (ref 135–145)

## 2019-12-30 LAB — CBC WITH DIFFERENTIAL/PLATELET
Abs Immature Granulocytes: 0.02 10*3/uL (ref 0.00–0.07)
Basophils Absolute: 0 10*3/uL (ref 0.0–0.1)
Basophils Relative: 0 %
Eosinophils Absolute: 0.2 10*3/uL (ref 0.0–0.5)
Eosinophils Relative: 4 %
HCT: 31.4 % — ABNORMAL LOW (ref 36.0–46.0)
Hemoglobin: 10.2 g/dL — ABNORMAL LOW (ref 12.0–15.0)
Immature Granulocytes: 0 %
Lymphocytes Relative: 15 %
Lymphs Abs: 0.8 10*3/uL (ref 0.7–4.0)
MCH: 30.8 pg (ref 26.0–34.0)
MCHC: 32.5 g/dL (ref 30.0–36.0)
MCV: 94.9 fL (ref 80.0–100.0)
Monocytes Absolute: 0.7 10*3/uL (ref 0.1–1.0)
Monocytes Relative: 13 %
Neutro Abs: 3.6 10*3/uL (ref 1.7–7.7)
Neutrophils Relative %: 68 %
Platelets: 178 10*3/uL (ref 150–400)
RBC: 3.31 MIL/uL — ABNORMAL LOW (ref 3.87–5.11)
RDW: 13.4 % (ref 11.5–15.5)
WBC: 5.3 10*3/uL (ref 4.0–10.5)
nRBC: 0 % (ref 0.0–0.2)

## 2019-12-30 LAB — RESPIRATORY PANEL BY RT PCR (FLU A&B, COVID)
Influenza A by PCR: NEGATIVE
Influenza B by PCR: NEGATIVE
SARS Coronavirus 2 by RT PCR: NEGATIVE

## 2019-12-30 MED ORDER — CARVEDILOL 6.25 MG PO TABS
6.2500 mg | ORAL_TABLET | Freq: Two times a day (BID) | ORAL | Status: DC
  Administered 2019-12-30 – 2020-01-04 (×8): 6.25 mg via ORAL
  Filled 2019-12-30 (×9): qty 1

## 2019-12-30 MED ORDER — ASPIRIN EC 81 MG PO TBEC
81.0000 mg | DELAYED_RELEASE_TABLET | Freq: Every day | ORAL | Status: DC
  Filled 2019-12-30: qty 1

## 2019-12-30 MED ORDER — ENOXAPARIN SODIUM 30 MG/0.3ML ~~LOC~~ SOLN
30.0000 mg | SUBCUTANEOUS | Status: DC
  Administered 2019-12-30: 30 mg via SUBCUTANEOUS
  Filled 2019-12-30: qty 0.3

## 2019-12-30 MED ORDER — LOSARTAN POTASSIUM 50 MG PO TABS
25.0000 mg | ORAL_TABLET | Freq: Every day | ORAL | Status: DC
  Administered 2019-12-31 – 2020-01-04 (×4): 25 mg via ORAL
  Filled 2019-12-30 (×4): qty 1

## 2019-12-30 MED ORDER — HYDROCODONE-ACETAMINOPHEN 5-325 MG PO TABS
1.0000 | ORAL_TABLET | Freq: Four times a day (QID) | ORAL | Status: DC | PRN
  Administered 2019-12-30 – 2019-12-31 (×2): 1 via ORAL
  Administered 2019-12-31: 2 via ORAL
  Administered 2020-01-02 – 2020-01-03 (×2): 1 via ORAL
  Filled 2019-12-30 (×3): qty 1
  Filled 2019-12-30: qty 2
  Filled 2019-12-30 (×3): qty 1

## 2019-12-30 MED ORDER — MORPHINE SULFATE (PF) 2 MG/ML IV SOLN
2.0000 mg | Freq: Once | INTRAVENOUS | Status: DC
  Filled 2019-12-30: qty 1

## 2019-12-30 MED ORDER — LATANOPROST 0.005 % OP SOLN
1.0000 [drp] | Freq: Every day | OPHTHALMIC | Status: DC
  Administered 2019-12-30 – 2019-12-31 (×2): 1 [drp] via OPHTHALMIC
  Filled 2019-12-30: qty 2.5

## 2019-12-30 MED ORDER — ACETAMINOPHEN 500 MG PO TABS
1000.0000 mg | ORAL_TABLET | Freq: Three times a day (TID) | ORAL | Status: DC | PRN
  Filled 2019-12-30: qty 2

## 2019-12-30 MED ORDER — HYDROMORPHONE HCL 1 MG/ML IJ SOLN
0.5000 mg | INTRAMUSCULAR | Status: DC | PRN
  Administered 2019-12-30 – 2020-01-01 (×4): 0.5 mg via INTRAVENOUS
  Filled 2019-12-30 (×2): qty 0.5
  Filled 2019-12-30: qty 1
  Filled 2019-12-30: qty 0.5

## 2019-12-30 MED ORDER — CALCIUM CARBONATE-VITAMIN D 500-200 MG-UNIT PO TABS
1.0000 | ORAL_TABLET | Freq: Every day | ORAL | Status: DC
  Administered 2019-12-31 – 2020-01-04 (×3): 1 via ORAL
  Filled 2019-12-30 (×5): qty 1

## 2019-12-30 MED ORDER — FUROSEMIDE 10 MG/ML IJ SOLN
20.0000 mg | Freq: Once | INTRAMUSCULAR | Status: AC
  Administered 2019-12-30: 20 mg via INTRAVENOUS
  Filled 2019-12-30: qty 2

## 2019-12-30 MED ORDER — EZETIMIBE 10 MG PO TABS
10.0000 mg | ORAL_TABLET | Freq: Every day | ORAL | Status: DC
  Administered 2019-12-31 – 2020-01-04 (×4): 10 mg via ORAL
  Filled 2019-12-30 (×4): qty 1

## 2019-12-30 MED ORDER — AMLODIPINE BESYLATE 5 MG PO TABS
7.5000 mg | ORAL_TABLET | Freq: Every day | ORAL | Status: DC
  Administered 2019-12-31 – 2020-01-04 (×4): 7.5 mg via ORAL
  Filled 2019-12-30 (×4): qty 2

## 2019-12-30 MED ORDER — ISOSORBIDE MONONITRATE ER 60 MG PO TB24
60.0000 mg | ORAL_TABLET | Freq: Every day | ORAL | Status: DC
  Administered 2019-12-31 – 2020-01-04 (×4): 60 mg via ORAL
  Filled 2019-12-30 (×6): qty 1

## 2019-12-30 MED ORDER — FENTANYL CITRATE (PF) 100 MCG/2ML IJ SOLN: 50.0000 ug | Freq: Once | INTRAMUSCULAR | Status: DC

## 2019-12-30 MED ORDER — FENTANYL CITRATE (PF) 100 MCG/2ML IJ SOLN
25.0000 ug | Freq: Once | INTRAMUSCULAR | Status: AC
  Administered 2019-12-30: 25 ug via INTRAVENOUS
  Filled 2019-12-30: qty 2

## 2019-12-30 MED ORDER — ADULT MULTIVITAMIN W/MINERALS CH
1.0000 | ORAL_TABLET | Freq: Every day | ORAL | Status: DC
  Administered 2019-12-31 – 2020-01-04 (×4): 1 via ORAL
  Filled 2019-12-30 (×4): qty 1

## 2019-12-30 NOTE — ED Triage Notes (Signed)
Pt arrives via EMS from home. Pt was walking out to car earlier and lost balance, fell on left side/left hip area. Complains of pain in groin area.  Shortening and outward rotation to left extremity. Pt did not hit head, no LOC, and is not on blood thinners. Hx of hip replacement on right side. 50 mcg of fentanyl given via EMS.   EMS Vitals BP 160/98 HR 78 R 14 SPO2 96%

## 2019-12-30 NOTE — Plan of Care (Signed)
Monitor pain and vascular access -

## 2019-12-30 NOTE — ED Provider Notes (Signed)
[place] DEPT Provider Note   CSN: [id number] Arrival date & time: 12/30/19  1346     History No chief complaint on file.   Sara Garza is a 86 y.o. female history of CAD, diabetes, obesity, hypertension, hyperlipidemia, osteoporosis, CHF, status post [name] only taking baby aspirin daily no thinner use.    Patient presents today for fall, she was outside walking when she lost her balance falling onto her left hip.  Patient reports immediate pain of the left hip severe sharp constant nonradiating worsened with movement palpation.  EMS was called she was given fentanyl with some improvement in her pain.  Denies head injury, loss of consciousness, presyncope, blood thinner use, headache, vision changes, nausea/vomiting, neck pain, back pain, chest pain, abdominal pain, pain of the upper extremities, pain of the right lower extremity or any additional concerns. HPI     Past Medical History:  Diagnosis Date  . Arthritis   . CAD (coronary artery disease)   . Carotid stenosis, left    50-60% stable  . Diabetes mellitus   . Glaucoma   . Hyperlipidemia   . Hypertension   . Leg pain    ABIs 2/18: normal bilaterally.  . Osteoporosis   . Valvular heart disease    Aortic Stenosis s/p [name] 2014 // Mitral stenosis // Echo 09/2018: EF > 65, mod LVH, severe focal basal hypertrophy, RVSP 39.8, mild to mod MR, mod to severe MS (mean 9), AVR with mild AI, severe LAE (similar to prior echo)    Patient Active Problem List   Diagnosis Date Noted  . Hip fracture ([place]) 12/30/2019  . Chronic diastolic CHF (congestive heart failure) ([place]) 02/25/2018  . S/P [name] (transcatheter aortic valve replacement) 02/25/2018  . Leg pain   . Hyperlipidemia   . Glaucoma   . Carotid stenosis, left   . Arthritis   . Urinary incontinence 02/12/2017  . Chronic bilateral low back pain 08/12/2016  . Spondylosis of lumbar joint 08/12/2016  . Bilateral leg pain 08/12/2016  . Neck  mass 04/15/2015  . Status post hip hemiarthroplasty 07/31/2013  . CAD (coronary artery disease) 07/31/2013  . CKD (chronic kidney disease), stage III 07/31/2013  . Left bundle branch block 07/31/2013  . Severe calcific aortic stenosis 06/18/2010  . Diabetic polyneuropathy ([place]) 04/16/2010  . GLAUCOMA 01/06/2009  . CHEST PAIN, ATYPICAL 12/16/2008  . Bilateral shoulder pain 11/16/2007  . OSTEOARTHRITIS 02/02/2007  . Type 2 diabetes mellitus with renal manifestations, controlled ([place]) 10/24/2006  . Dyslipidemia 10/02/2006  . Essential hypertension 10/02/2006    Past Surgical History:  Procedure Laterality Date  . ANOMALOUS PULMONARY VENOUS RETURN REPAIR, TOTAL    . APPENDECTOMY    . CARDIAC VALVE REPLACEMENT  04/29/2012  . Cataracts Bilateral   . CESAREAN SECTION    . FOOT SURGERY     [name]  . HIP ARTHROPLASTY Right 07/31/2013   Procedure: ARTHROPLASTY BIPOLAR HIP;  Surgeon: [name], MD;  Location: [facility] ORS;  Service: Orthopedics;  Laterality: Right;  . PERCUTANEOUS CORONARY STENT INTERVENTION (PCI-S) N/A 01/02/2012   Procedure: PERCUTANEOUS CORONARY STENT INTERVENTION (PCI-S);  Surgeon: Sherren Mocha, MD;  Location: [facility] CATH LAB;  Service: Cardiovascular;  Laterality: N/A;     OB History   No obstetric history on file.     Family History  Problem Relation Age of Onset  . COPD Father   . Cancer Father        lung cancer  . Lung cancer Other  Social History   Tobacco Use  . Smoking status: Passive Smoke Exposure - Never Smoker  . Smokeless tobacco: Never Used  Substance Use Topics  . Alcohol use: Yes    Alcohol/week: 0.0 standard drinks    Comment: very seldom  . Drug use: No    Home Medications Prior to Admission medications   Medication Sig Start Date End Date Taking? Authorizing Provider  acetaminophen (TYLENOL) 500 MG tablet Take 1,000 mg by mouth in the morning and at bedtime.   Yes [provider]  amLODipine (NORVASC) 5 MG tablet TAKE 1  AND 1/2 TABLETS(7.5 MG) BY MOUTH DAILY Patient taking differently: Take 7.5 mg by mouth daily.  09/28/19  Yes Nafziger, [name], NP  amoxicillin (AMOXIL) 500 MG capsule TK 4 CAPSULES PO 1 HOUR PRIOR TO DENTAL PROCEDURES 02/02/19  Yes Sherren Mocha, MD  aspirin EC 81 MG tablet Take 1 tablet (81 mg total) by mouth daily. Start after lovenox injections are done after 2 weeks 08/03/13  Yes Rai, Ripudeep K, MD  bimatoprost (LUMIGAN) 0.01 % SOLN Place 1 drop into both eyes at bedtime.    Yes [provider]  Calcium Carb-Cholecalciferol (CALCIUM 500 +D) 500-400 MG-UNIT TABS Take 1 tablet by mouth daily.    Yes [provider]  carvedilol (COREG) 3.125 MG tablet TAKE 1 TABLET(3.125 MG) BY MOUTH TWICE DAILY WITH A MEAL 09/16/19  Yes Nafziger, [name], NP  ezetimibe (ZETIA) 10 MG tablet TAKE 1 TABLET(10 MG) BY MOUTH DAILY Patient taking differently: Take 10 mg by mouth daily.  11/03/19  Yes Sherren Mocha, MD  furosemide (LASIX) 20 MG tablet Take 1 tablet (20 mg total) by mouth daily. Patient taking differently: Take 10-20 mg by mouth daily as needed for fluid.  04/19/19 04/13/20 Yes Sherren Mocha, MD  isosorbide mononitrate (IMDUR) 60 MG 24 hr tablet TAKE 1 TABLET(60 MG) BY MOUTH DAILY Patient taking differently: Take 60 mg by mouth daily.  04/05/19  Yes Weaver, Scott T, PA-C  losartan (COZAAR) 25 MG tablet TAKE 1 TABLET(25 MG) BY MOUTH DAILY Patient taking differently: Take 25 mg by mouth daily.  08/25/19  Yes Nafziger, [name], NP  Multiple Vitamin (MULTIVITAMIN) tablet Take 1 tablet by mouth daily.     Yes [provider]  Multiple Vitamins-Minerals (PRESERVISION AREDS 2+MULTI VIT PO) Take 2 capsules by mouth in the morning and at bedtime.   Yes [provider]  oxybutynin (DITROPAN-XL) 5 MG 24 hr tablet TAKE 1 TABLET(5 MG) BY MOUTH AT BEDTIME Patient taking differently: Take 5 mg by mouth at bedtime.  12/02/19   Nafziger, [name], NP    Allergies    Statins and Gabapentin  Review  of Systems   Review of Systems Ten systems are reviewed and are negative for acute change except as noted in the HPI  Physical Exam Updated Vital Signs BP (!) 143/54   Pulse 73   Temp 97.7 F (36.5 C) (Oral)   Resp 18   Wt 71.7 kg   SpO2 99%   BMI 34.19 kg/m   Physical Exam Constitutional:      General: She is not in acute distress.    Appearance: Normal appearance. She is well-developed. She is not ill-appearing or diaphoretic.  HENT:     Head: Normocephalic and atraumatic.  Eyes:     General: Vision grossly intact. Gaze aligned appropriately.     Pupils: Pupils are equal, round, and reactive to light.  Neck:     Trachea: Trachea and phonation normal.  Cardiovascular:     Rate and Rhythm: Normal rate and regular rhythm.     Pulses:          Dorsalis pedis pulses are 2+ on the right side and 2+ on the left side.  Pulmonary:     Effort: Pulmonary effort is normal. No respiratory distress.  Abdominal:     General: There is no distension.     Palpations: Abdomen is soft.     Tenderness: There is no abdominal tenderness. There is no guarding or rebound.  Musculoskeletal:     Cervical back: Normal range of motion.     Comments: Shortening and external rotation of the left lower extremity, tenderness to palpation of the left hip  Feet:     Right foot:     Protective Sensation: 3 sites tested. 3 sites sensed.     Left foot:     Protective Sensation: 3 sites tested. 3 sites sensed.  Skin:    General: Skin is warm and dry.  Neurological:     Mental Status: She is alert.     GCS: GCS eye subscore is 4. GCS verbal subscore is 5. GCS motor subscore is 6.     Comments: Speech is clear and goal oriented, follows commands Major Cranial nerves without deficit, no facial droop Moves extremities without ataxia, coordination intact  Psychiatric:        Behavior: Behavior normal.     ED Results / Procedures / Treatments   Labs (all labs ordered are listed, but only abnormal  results are displayed) Labs Reviewed  CBC WITH DIFFERENTIAL/PLATELET - Abnormal; Notable for the following components:      Result Value   RBC 3.31 (*)    Hemoglobin 10.2 (*)    HCT 31.4 (*)    All other components within normal limits  BASIC METABOLIC PANEL - Abnormal; Notable for the following components:   CO2 21 (*)    BUN 37 (*)    Creatinine, Ser 1.29 (*)    GFR calc non Af Amer 35 (*)    GFR calc Af Amer 41 (*)    All other components within normal limits  RESPIRATORY PANEL BY RT PCR (FLU A&B, COVID)  CBC  BASIC METABOLIC PANEL    EKG None  Radiology DG Pelvis 1-2 Views  Result Date: 12/30/2019 CLINICAL DATA:  Pt c/o left groin pain s/p fall today while carrying trash to dumpster. EXAM: PELVIS - 1-2 VIEW; DG HIP (WITH OR WITHOUT PELVIS) 2-3V LEFT; LEFT FEMUR 2 VIEWS COMPARISON:  X-ray pelvis 07/31/2013. FINDINGS: Acute, comminuted, left femoral intertrochanteric fracture. No left hip dislocation. No acute fracture or dislocation of the left femur distally. The left knee is visualized on the AP view and demonstrates severe medial tibiofemoral compartment degenerative changes with marked joint space narrowing and osteophyte marginal formation. There is no evidence of pelvic fracture or diastasis. No pelvic bone lesions are seen. Visualized portions of the lower lumbar spine demonstrates degenerative changes. Partially visualized sacrum is grossly unremarkable with limited evaluation due to overlying bowel. Right hip arthroplasty. The surgical hardware femoral head component appears to be in alignment with the acetabulum. No evidence of proximal right femoral fracture. Vascular calcifications. Similar-appearing peripherally calcified calcification overlying the left pelvis. Right inguinal region surgical clips. Round calcification overlying the region of the left gluteus soft tissues likely represents an injection granuloma. IMPRESSION: 1. Acute comminuted left femoral intertrochanteric  fracture. No left hip dislocation. 2. Right hip arthroplasty with no radiographic evidence  of surgical hardware complication. 3. No acute displaced fracture or diastasis of the bones of the pelvis. 4. Severe left knee degenerative changes with no findings to suggest acute displaced fracture or dislocation of the bones of the distal left femur. Please note limited evaluation of the left knee with only a single AP view. Electronically Signed   By: Iven Finn M.D.   On: 12/30/2019 15:38   DG Chest Portable 1 View  Result Date: 12/30/2019 CLINICAL DATA:  Fall EXAM: PORTABLE CHEST 1 VIEW COMPARISON:  Chest x-ray 03/23/2018. FINDINGS: The heart size and mediastinal contours are unchanged. Aortic valve replacement. Mitral annular calcifications again noted. Aortic arch calcifications. No focal consolidation. No pulmonary edema. No pleural effusion. No pneumothorax. Multilevel degenerative changes of the visualized spine. Degenerative changes of bilateral shoulders. No acute displaced fracture. IMPRESSION: No active cardiopulmonary disease. Electronically Signed   By: Iven Finn M.D.   On: 12/30/2019 15:41   DG Hip Unilat W or Wo Pelvis 2-3 Views Left  Result Date: 12/30/2019 CLINICAL DATA:  Pt c/o left groin pain s/p fall today while carrying trash to dumpster. EXAM: PELVIS - 1-2 VIEW; DG HIP (WITH OR WITHOUT PELVIS) 2-3V LEFT; LEFT FEMUR 2 VIEWS COMPARISON:  X-ray pelvis 07/31/2013. FINDINGS: Acute, comminuted, left femoral intertrochanteric fracture. No left hip dislocation. No acute fracture or dislocation of the left femur distally. The left knee is visualized on the AP view and demonstrates severe medial tibiofemoral compartment degenerative changes with marked joint space narrowing and osteophyte marginal formation. There is no evidence of pelvic fracture or diastasis. No pelvic bone lesions are seen. Visualized portions of the lower lumbar spine demonstrates degenerative changes. Partially  visualized sacrum is grossly unremarkable with limited evaluation due to overlying bowel. Right hip arthroplasty. The surgical hardware femoral head component appears to be in alignment with the acetabulum. No evidence of proximal right femoral fracture. Vascular calcifications. Similar-appearing peripherally calcified calcification overlying the left pelvis. Right inguinal region surgical clips. Round calcification overlying the region of the left gluteus soft tissues likely represents an injection granuloma. IMPRESSION: 1. Acute comminuted left femoral intertrochanteric fracture. No left hip dislocation. 2. Right hip arthroplasty with no radiographic evidence of surgical hardware complication. 3. No acute displaced fracture or diastasis of the bones of the pelvis. 4. Severe left knee degenerative changes with no findings to suggest acute displaced fracture or dislocation of the bones of the distal left femur. Please note limited evaluation of the left knee with only a single AP view. Electronically Signed   By: Iven Finn M.D.   On: 12/30/2019 15:38   DG Femur Min 2 Views Left  Result Date: 12/30/2019 CLINICAL DATA:  Pt c/o left groin pain s/p fall today while carrying trash to dumpster. EXAM: PELVIS - 1-2 VIEW; DG HIP (WITH OR WITHOUT PELVIS) 2-3V LEFT; LEFT FEMUR 2 VIEWS COMPARISON:  X-ray pelvis 07/31/2013. FINDINGS: Acute, comminuted, left femoral intertrochanteric fracture. No left hip dislocation. No acute fracture or dislocation of the left femur distally. The left knee is visualized on the AP view and demonstrates severe medial tibiofemoral compartment degenerative changes with marked joint space narrowing and osteophyte marginal formation. There is no evidence of pelvic fracture or diastasis. No pelvic bone lesions are seen. Visualized portions of the lower lumbar spine demonstrates degenerative changes. Partially visualized sacrum is grossly unremarkable with limited evaluation due to overlying  bowel. Right hip arthroplasty. The surgical hardware femoral head component appears to be in alignment with the acetabulum. No evidence of  proximal right femoral fracture. Vascular calcifications. Similar-appearing peripherally calcified calcification overlying the left pelvis. Right inguinal region surgical clips. Round calcification overlying the region of the left gluteus soft tissues likely represents an injection granuloma. IMPRESSION: 1. Acute comminuted left femoral intertrochanteric fracture. No left hip dislocation. 2. Right hip arthroplasty with no radiographic evidence of surgical hardware complication. 3. No acute displaced fracture or diastasis of the bones of the pelvis. 4. Severe left knee degenerative changes with no findings to suggest acute displaced fracture or dislocation of the bones of the distal left femur. Please note limited evaluation of the left knee with only a single AP view. Electronically Signed   By: Iven Finn M.D.   On: 12/30/2019 15:38    Procedures Procedures (including critical care time)  Medications Ordered in ED Medications  HYDROmorphone (DILAUDID) injection 0.5 mg (has no administration in time range)  furosemide (LASIX) injection 20 mg (has no administration in time range)  acetaminophen (TYLENOL) tablet 1,000 mg (has no administration in time range)  aspirin EC tablet 81 mg (has no administration in time range)  amLODipine (NORVASC) tablet 7.5 mg (has no administration in time range)  carvedilol (COREG) tablet 6.25 mg (has no administration in time range)  ezetimibe (ZETIA) tablet 10 mg (has no administration in time range)  isosorbide mononitrate (IMDUR) 24 hr tablet 60 mg (has no administration in time range)  losartan (COZAAR) tablet 25 mg (has no administration in time range)  calcium-vitamin D 500-200 MG-UNIT per tablet 1 tablet (has no administration in time range)  multivitamin with minerals tablet 1 tablet (has no administration in time  range)  latanoprost (XALATAN) 0.005 % ophthalmic solution 1 drop (has no administration in time range)  HYDROcodone-acetaminophen (NORCO/VICODIN) 5-325 MG per tablet 1-2 tablet (has no administration in time range)  enoxaparin (LOVENOX) injection 30 mg (has no administration in time range)  fentaNYL (SUBLIMAZE) injection 25 mcg (25 mcg Intravenous Given 12/30/19 1616)    ED Course  I have reviewed the triage vital signs and the nursing notes.  Pertinent labs & imaging results that were available during my care of the patient were reviewed by me and considered in my medical decision making (see chart for details).  Clinical Course as of Dec 30 1755  Thu Dec 30, 2019  1604 Dr. Stann Mainland   [BM]  [id number] Dr. Roosevelt Locks   [BM]    Clinical Course User Index [BM] Gari Crown   MDM Rules/Calculators/A&P                          Additional history obtained from: 1. Nursing notes from this visit. 2. Family, daughter at bedside. ------------------------------------ 86 year old female presented after mechanical fall today onto left hip, no presyncope, chest pain or shortness of breath.  No blood thinner use, head injury.  She reports pain only to the left hip there is shortening and external rotation at the left lower extremity.  Strong and equal pedal pulses, cap refill and sensation is intact.  Clinically concern is for left femur fracture will obtain x-ray, basic blood work and Covid test.  Patient reports that she is a patient of Dr. Alvan Dame with [facility]. - I reviewed and interpreted labs which include: Covid/influenza panel negative. CBC shows anemia 10.2 slightly lower than prior at 11.0, no leukocytosis. BMP shows baseline kidney function, no emergent electrolyte derangement.  DG Chest:    IMPRESSION:  No active cardiopulmonary disease.   DG Pelvis/Left Hip/Left  Femur: IMPRESSION:  1. Acute comminuted left femoral intertrochanteric fracture. No left  hip dislocation.  2.  Right hip arthroplasty with no radiographic evidence of surgical  hardware complication.  3. No acute displaced fracture or diastasis of the bones of the  pelvis.  4. Severe left knee degenerative changes with no findings to suggest  acute displaced fracture or dislocation of the bones of the distal  left femur. Please note limited evaluation of the left knee with  only a single AP view.   4:04 PM: Discussed case with Dr. Stann Mainland, advises admission to hospitalist service, orthopedic service to see patient in consult. - Patient reevaluated she is resting comfortably in bed no acute distress, daughter at bedside.  She reports pain is well controlled with fentanyl, no additional pain medication needed at this time.  She is agreeable for admission and orthopedic evaluation.  She remains neurovascular intact to the leg, she denies any other pain or or concerns. - 4:38 PM: Discussed case with Dr. Roosevelt Locks, patient accepted to hospitalist service.  Note: Portions of this report may have been transcribed using voice recognition software. Every effort was made to ensure accuracy; however, inadvertent computerized transcription errors may still be present. Final Clinical Impression(s) / ED Diagnoses Final diagnoses:  Closed fracture of femur, intertrochanteric, left, initial encounter [facility])    Rx / [place] Orders ED Discharge Orders    None       Gari Crown 12/30/19 1757    Quintella Reichert, MD 12/31/19 [phone]

## 2019-12-30 NOTE — H&P (Signed)
History and Physical    Sara Garza IDP:[id number] DOB: 10-Jun-1923 DOA: 12/30/2019  PCP: [name], NP (Confirm with patient/family/NH records and if not entered, this has to be entered at [facility] point of entry) Patient coming from: home  I have personally briefly reviewed patient's old medical records in [place]  Chief Complaint: I fell  HPI: Sara Garza is a 86 y.o. female with medical history significant of chronic diastolic CHF, aortic stenosis status post [name] in 2014, hypertension, CKD stage III, presented with mechanical fall.  Patient this afternoon tripped and fell on her left hip while walking to a dumpster.  She denied any prodromes of lightheaded or blurred vision before she fell down and denied any chest pain or shortness of breath.    She has been following with cardiologist for her CHF and [name] follow-up, most recent echo in July 2021 showed EF 65 to 70% with asymmetrical wall thickening and diastolic dysfunction, her cardiologist has started her on 20 mg of Lasix as needed for leg swelling and weight gaining, patient however decided on her own to cut the as needed Lasix to 10 mg as needed and she barely take any.  For the reason that she claimed 20 mg Lasix make her urinate too much and too frequently.  She does have significant exertional dyspnea with tolerance of about 5 to 7 minutes walk but she denied any chest pain and no orthopnea.  ED Course: Blood pressure significant elevated and physical exam showed significant fluid overload, hip x-ray showed comminuted right intertrochanteric hip fracture.  Review of Systems: As per HPI otherwise 14 point review of systems negative.    Past Medical History:  Diagnosis Date  . Arthritis   . CAD (coronary artery disease)   . Carotid stenosis, left    50-60% stable  . Diabetes mellitus   . Glaucoma   . Hyperlipidemia   . Hypertension   . Leg pain    ABIs 2/18: normal bilaterally.  . Osteoporosis   . Valvular  heart disease    Aortic Stenosis s/p [name] 2014 // Mitral stenosis // Echo 09/2018: EF > 65, mod LVH, severe focal basal hypertrophy, RVSP 39.8, mild to mod MR, mod to severe MS (mean 9), AVR with mild AI, severe LAE (similar to prior echo)    Past Surgical History:  Procedure Laterality Date  . ANOMALOUS PULMONARY VENOUS RETURN REPAIR, TOTAL    . APPENDECTOMY    . CARDIAC VALVE REPLACEMENT  04/29/2012  . Cataracts Bilateral   . CESAREAN SECTION    . FOOT SURGERY     [name]  . HIP ARTHROPLASTY Right 07/31/2013   Procedure: ARTHROPLASTY BIPOLAR HIP;  Surgeon: [name], MD;  Location: [facility] ORS;  Service: Orthopedics;  Laterality: Right;  . PERCUTANEOUS CORONARY STENT INTERVENTION (PCI-S) N/A 01/02/2012   Procedure: PERCUTANEOUS CORONARY STENT INTERVENTION (PCI-S);  Surgeon: Sherren Mocha, MD;  Location: [facility] CATH LAB;  Service: Cardiovascular;  Laterality: N/A;     reports that she is a non-smoker but has been exposed to tobacco smoke. She has never used smokeless tobacco. She reports current alcohol use. She reports that she does not use drugs.  Allergies  Allergen Reactions  . Statins Other (See Comments)    Muscle aches  . Gabapentin Other (See Comments)    SOMNOLENCE    Family History  Problem Relation Age of Onset  . COPD Father   . Cancer Father        lung  cancer  . Lung cancer Other      Prior to Admission medications   Medication Sig Start Date End Date Taking? Authorizing Provider  acetaminophen (TYLENOL) 500 MG tablet Take 1,000 mg by mouth in the morning and at bedtime.   Yes [provider]  amLODipine (NORVASC) 5 MG tablet TAKE 1 AND 1/2 TABLETS(7.5 MG) BY MOUTH DAILY Patient taking differently: Take 7.5 mg by mouth daily.  09/28/19  Yes Nafziger, [name], NP  amoxicillin (AMOXIL) 500 MG capsule TK 4 CAPSULES PO 1 HOUR PRIOR TO DENTAL PROCEDURES 02/02/19  Yes Sherren Mocha, MD  aspirin EC 81 MG tablet Take 1 tablet (81 mg total) by mouth daily. Start  after lovenox injections are done after 2 weeks 08/03/13  Yes Rai, Ripudeep K, MD  bimatoprost (LUMIGAN) 0.01 % SOLN Place 1 drop into both eyes at bedtime.    Yes [provider]  Calcium Carb-Cholecalciferol (CALCIUM 500 +D) 500-400 MG-UNIT TABS Take 1 tablet by mouth daily.    Yes [provider]  carvedilol (COREG) 3.125 MG tablet TAKE 1 TABLET(3.125 MG) BY MOUTH TWICE DAILY WITH A MEAL 09/16/19  Yes Nafziger, [name], NP  ezetimibe (ZETIA) 10 MG tablet TAKE 1 TABLET(10 MG) BY MOUTH DAILY Patient taking differently: Take 10 mg by mouth daily.  11/03/19  Yes Sherren Mocha, MD  furosemide (LASIX) 20 MG tablet Take 1 tablet (20 mg total) by mouth daily. Patient taking differently: Take 10-20 mg by mouth daily as needed for fluid.  04/19/19 04/13/20 Yes Sherren Mocha, MD  isosorbide mononitrate (IMDUR) 60 MG 24 hr tablet TAKE 1 TABLET(60 MG) BY MOUTH DAILY Patient taking differently: Take 60 mg by mouth daily.  04/05/19  Yes Weaver, Scott T, PA-C  losartan (COZAAR) 25 MG tablet TAKE 1 TABLET(25 MG) BY MOUTH DAILY Patient taking differently: Take 25 mg by mouth daily.  08/25/19  Yes Nafziger, [name], NP  Multiple Vitamin (MULTIVITAMIN) tablet Take 1 tablet by mouth daily.     Yes [provider]  Multiple Vitamins-Minerals (PRESERVISION AREDS 2+MULTI VIT PO) Take 2 capsules by mouth in the morning and at bedtime.   Yes [provider]  oxybutynin (DITROPAN-XL) 5 MG 24 hr tablet TAKE 1 TABLET(5 MG) BY MOUTH AT BEDTIME Patient taking differently: Take 5 mg by mouth at bedtime.  12/02/19   [name], NP    Physical Exam: Vitals:   12/30/19 1645 12/30/19 1700 12/30/19 1730 12/30/19 1800  BP:  129/61 (!) 143/54   Pulse: 77 78 73   Resp: 14 18 18    Temp:    97.6 F (36.4 C)  TempSrc:      SpO2: 98% 99% 99%   Weight:        Constitutional: NAD, calm, comfortable Vitals:   12/30/19 1645 12/30/19 1700 12/30/19 1730 12/30/19 1800  BP:  129/61 (!) 143/54   Pulse: 77  78 73   Resp: 14 18 18    Temp:    97.6 F (36.4 C)  TempSrc:      SpO2: 98% 99% 99%   Weight:       Eyes: PERRL, lids and conjunctivae normal ENMT: Mucous membranes are moist. Posterior pharynx clear of any exudate or lesions.Normal dentition.  Neck: normal, supple, no masses, no thyromegaly Respiratory: clear to auscultation bilaterally, fine crackles on bilateral bases. Normal respiratory effort. No accessory muscle use.  Cardiovascular: Regular rate and rhythm, no murmurs / rubs / gallops.  2+ extremity edema. 2+ pedal pulses. No carotid bruits.  Abdomen: no tenderness, no masses palpated. No hepatosplenomegaly. Bowel sounds positive.  Musculoskeletal: no clubbing / cyanosis.  Shortened and left rotated left leg.  Skin: no rashes, lesions, ulcers. No induration Neurologic: CN 2-12 grossly intact. Sensation intact, DTR normal. Strength 5/5 in all 4.  Psychiatric: Normal judgment and insight. Alert and oriented x 3. Normal mood.    Labs on Admission: I have personally reviewed following labs and imaging studies  CBC: Recent Labs  Lab 12/30/19 1429  WBC 5.3  NEUTROABS 3.6  HGB 10.2*  HCT 31.4*  MCV 94.9  PLT 818   Basic Metabolic Panel: Recent Labs  Lab 12/30/19 1429  NA 141  K 4.3  CL 109  CO2 21*  GLUCOSE 99  BUN 37*  CREATININE 1.29*  CALCIUM 9.0   GFR: Estimated Creatinine Clearance: 21.8 mL/min (A) (by C-G formula based on SCr of 1.29 mg/dL (H)). Liver Function Tests: No results for input(s): AST, ALT, ALKPHOS, BILITOT, PROT, ALBUMIN in the last 168 hours. No results for input(s): LIPASE, AMYLASE in the last 168 hours. No results for input(s): AMMONIA in the last 168 hours. Coagulation Profile: No results for input(s): INR, PROTIME in the last 168 hours. Cardiac Enzymes: No results for input(s): CKTOTAL, CKMB, CKMBINDEX, TROPONINI in the last 168 hours. BNP (last 3 results) No results for input(s): PROBNP in the last 8760 hours. HbA1C: No results for  input(s): HGBA1C in the last 72 hours. CBG: No results for input(s): GLUCAP in the last 168 hours. Lipid Profile: No results for input(s): CHOL, HDL, LDLCALC, TRIG, CHOLHDL, LDLDIRECT in the last 72 hours. Thyroid Function Tests: No results for input(s): TSH, T4TOTAL, FREET4, T3FREE, THYROIDAB in the last 72 hours. Anemia Panel: No results for input(s): VITAMINB12, FOLATE, FERRITIN, TIBC, IRON, RETICCTPCT in the last 72 hours. Urine analysis:    Component Value Date/Time   COLORURINE YELLOW 09/17/2016 1155   APPEARANCEUR [place] (A) 09/17/2016 1155   LABSPEC 1.025 09/17/2016 1155   PHURINE 5.0 09/17/2016 1155   GLUCOSEU NEGATIVE 09/17/2016 1155   HGBUR NEGATIVE 09/17/2016 [place] 09/17/2016 1155   BILIRUBINUR n 06/18/2016 1016   KETONESUR 15 (A) 09/17/2016 1155   PROTEINUR n 06/18/2016 1016   PROTEINUR NEGATIVE 06/02/2016 1753   UROBILINOGEN 0.2 09/17/2016 1155   NITRITE NEGATIVE 09/17/2016 1155   LEUKOCYTESUR TRACE (A) 09/17/2016 1155    Radiological Exams on Admission: DG Pelvis 1-2 Views  Result Date: 12/30/2019 CLINICAL DATA:  Pt c/o left groin pain s/p fall today while carrying trash to dumpster. EXAM: PELVIS - 1-2 VIEW; DG HIP (WITH OR WITHOUT PELVIS) 2-3V LEFT; LEFT FEMUR 2 VIEWS COMPARISON:  X-ray pelvis 07/31/2013. FINDINGS: Acute, comminuted, left femoral intertrochanteric fracture. No left hip dislocation. No acute fracture or dislocation of the left femur distally. The left knee is visualized on the AP view and demonstrates severe medial tibiofemoral compartment degenerative changes with marked joint space narrowing and osteophyte marginal formation. There is no evidence of pelvic fracture or diastasis. No pelvic bone lesions are seen. Visualized portions of the lower lumbar spine demonstrates degenerative changes. Partially visualized sacrum is grossly unremarkable with limited evaluation due to overlying bowel. Right hip arthroplasty. The surgical  hardware femoral head component appears to be in alignment with the acetabulum. No evidence of proximal right femoral fracture. Vascular calcifications. Similar-appearing peripherally calcified calcification overlying the left pelvis. Right inguinal region surgical clips. Round calcification overlying the region of the left gluteus soft tissues likely represents an injection granuloma. IMPRESSION:  1. Acute comminuted left femoral intertrochanteric fracture. No left hip dislocation. 2. Right hip arthroplasty with no radiographic evidence of surgical hardware complication. 3. No acute displaced fracture or diastasis of the bones of the pelvis. 4. Severe left knee degenerative changes with no findings to suggest acute displaced fracture or dislocation of the bones of the distal left femur. Please note limited evaluation of the left knee with only a single AP view. Electronically Signed   By: Iven Finn M.D.   On: 12/30/2019 15:38   DG Chest Portable 1 View  Result Date: 12/30/2019 CLINICAL DATA:  Fall EXAM: PORTABLE CHEST 1 VIEW COMPARISON:  Chest x-ray 03/23/2018. FINDINGS: The heart size and mediastinal contours are unchanged. Aortic valve replacement. Mitral annular calcifications again noted. Aortic arch calcifications. No focal consolidation. No pulmonary edema. No pleural effusion. No pneumothorax. Multilevel degenerative changes of the visualized spine. Degenerative changes of bilateral shoulders. No acute displaced fracture. IMPRESSION: No active cardiopulmonary disease. Electronically Signed   By: Iven Finn M.D.   On: 12/30/2019 15:41   DG Hip Unilat W or Wo Pelvis 2-3 Views Left  Result Date: 12/30/2019 CLINICAL DATA:  Pt c/o left groin pain s/p fall today while carrying trash to dumpster. EXAM: PELVIS - 1-2 VIEW; DG HIP (WITH OR WITHOUT PELVIS) 2-3V LEFT; LEFT FEMUR 2 VIEWS COMPARISON:  X-ray pelvis 07/31/2013. FINDINGS: Acute, comminuted, left femoral intertrochanteric fracture. No left  hip dislocation. No acute fracture or dislocation of the left femur distally. The left knee is visualized on the AP view and demonstrates severe medial tibiofemoral compartment degenerative changes with marked joint space narrowing and osteophyte marginal formation. There is no evidence of pelvic fracture or diastasis. No pelvic bone lesions are seen. Visualized portions of the lower lumbar spine demonstrates degenerative changes. Partially visualized sacrum is grossly unremarkable with limited evaluation due to overlying bowel. Right hip arthroplasty. The surgical hardware femoral head component appears to be in alignment with the acetabulum. No evidence of proximal right femoral fracture. Vascular calcifications. Similar-appearing peripherally calcified calcification overlying the left pelvis. Right inguinal region surgical clips. Round calcification overlying the region of the left gluteus soft tissues likely represents an injection granuloma. IMPRESSION: 1. Acute comminuted left femoral intertrochanteric fracture. No left hip dislocation. 2. Right hip arthroplasty with no radiographic evidence of surgical hardware complication. 3. No acute displaced fracture or diastasis of the bones of the pelvis. 4. Severe left knee degenerative changes with no findings to suggest acute displaced fracture or dislocation of the bones of the distal left femur. Please note limited evaluation of the left knee with only a single AP view. Electronically Signed   By: Iven Finn M.D.   On: 12/30/2019 15:38   DG Femur Min 2 Views Left  Result Date: 12/30/2019 CLINICAL DATA:  Pt c/o left groin pain s/p fall today while carrying trash to dumpster. EXAM: PELVIS - 1-2 VIEW; DG HIP (WITH OR WITHOUT PELVIS) 2-3V LEFT; LEFT FEMUR 2 VIEWS COMPARISON:  X-ray pelvis 07/31/2013. FINDINGS: Acute, comminuted, left femoral intertrochanteric fracture. No left hip dislocation. No acute fracture or dislocation of the left femur distally. The  left knee is visualized on the AP view and demonstrates severe medial tibiofemoral compartment degenerative changes with marked joint space narrowing and osteophyte marginal formation. There is no evidence of pelvic fracture or diastasis. No pelvic bone lesions are seen. Visualized portions of the lower lumbar spine demonstrates degenerative changes. Partially visualized sacrum is grossly unremarkable with limited evaluation due to overlying bowel. Right  hip arthroplasty. The surgical hardware femoral head component appears to be in alignment with the acetabulum. No evidence of proximal right femoral fracture. Vascular calcifications. Similar-appearing peripherally calcified calcification overlying the left pelvis. Right inguinal region surgical clips. Round calcification overlying the region of the left gluteus soft tissues likely represents an injection granuloma. IMPRESSION: 1. Acute comminuted left femoral intertrochanteric fracture. No left hip dislocation. 2. Right hip arthroplasty with no radiographic evidence of surgical hardware complication. 3. No acute displaced fracture or diastasis of the bones of the pelvis. 4. Severe left knee degenerative changes with no findings to suggest acute displaced fracture or dislocation of the bones of the distal left femur. Please note limited evaluation of the left knee with only a single AP view. Electronically Signed   By: Iven Finn M.D.   On: 12/30/2019 15:38    EKG: Independently reviewed.  Chronic LBBB  Assessment/Plan Active Problems:   Hip fracture (HCC)  (please populate well all problems here in Problem List. (For example, if patient is on BP meds at home and you resume or decide to hold them, it is a problem that needs to be her. Same for CAD, COPD, HLD and so on)  Left hip fracture -Appears that will need ORIF -Surgeon on board, pain control, plan for Saturday surgery so n.p.o. after midnight on Friday  Acute on chronic diastolic CHF  decompensation -Likely from noncompliant with Lasix.  Discussed with on-call cardiologist, recommend Lasix, given the importance of recovered normal anatomy of the left hip and quality of life/ambulation function recovery, ORIF indicated.  However given her significant fluid overload and CHF decompensation, will certainly need to optimize her CHF medication acutely and in follow-up.  Lasix 20 mg x 1, and I also increased her BP medication of Coreg from 3.15-6.25, and keep amlodipine 7.5, consider increase losartan tomorrow or after surgery. -Cardiology will follow up with patient tomorrow  CKD stage III -Fluid overload, diuresis as above  History of aortic stenosis status post [name] -Denied any symptoms of near syncope before the fall -Most recent echo reassuring  HTN -As above  DVT prophylaxis: Heparin subcu Code Status: Full code Family Communication: Daughter at bedside Disposition Plan: Expect more than 2 midnight hospital stay for hip surgery and CHF medication optimization Consults called: Orthopedic surgery and cardiology Admission status: Telemetry admission   Lequita Halt MD [facility] Pager [phone]  12/30/2019, 6:23 PM

## 2019-12-30 NOTE — Progress Notes (Signed)
I have been contacted by the emergency department regarding Sara Garza and her left hip fracture.  She has requested [facility] for treatment.  This is a injury that will need surgical care.  We appreciate the hospitalist for their medical management and admission.  We will follow as consultants.  She will have surgery Saturday morning as a 730 case.  She may eat today and tomorrow.  Please keep n.p.o. Friday night at midnight.

## 2019-12-31 ENCOUNTER — Other Ambulatory Visit: Payer: Self-pay

## 2019-12-31 DIAGNOSIS — I350 Nonrheumatic aortic (valve) stenosis: Secondary | ICD-10-CM

## 2019-12-31 DIAGNOSIS — S72142A Displaced intertrochanteric fracture of left femur, initial encounter for closed fracture: Principal | ICD-10-CM

## 2019-12-31 DIAGNOSIS — I050 Rheumatic mitral stenosis: Secondary | ICD-10-CM

## 2019-12-31 DIAGNOSIS — I10 Essential (primary) hypertension: Secondary | ICD-10-CM | POA: Insufficient documentation

## 2019-12-31 DIAGNOSIS — Z01810 Encounter for preprocedural cardiovascular examination: Secondary | ICD-10-CM

## 2019-12-31 DIAGNOSIS — I5032 Chronic diastolic (congestive) heart failure: Secondary | ICD-10-CM | POA: Insufficient documentation

## 2019-12-31 LAB — BASIC METABOLIC PANEL
Anion gap: 10 (ref 5–15)
BUN: 37 mg/dL — ABNORMAL HIGH (ref 8–23)
CO2: 24 mmol/L (ref 22–32)
Calcium: 8.9 mg/dL (ref 8.9–10.3)
Chloride: 106 mmol/L (ref 98–111)
Creatinine, Ser: 1.45 mg/dL — ABNORMAL HIGH (ref 0.44–1.00)
GFR calc Af Amer: 36 mL/min — ABNORMAL LOW (ref >60)
GFR calc non Af Amer: 31 mL/min — ABNORMAL LOW (ref >60)
Glucose, Bld: 135 mg/dL — ABNORMAL HIGH (ref 70–99)
Potassium: 5.2 mmol/L — ABNORMAL HIGH (ref 3.5–5.1)
Sodium: 140 mmol/L (ref 135–145)

## 2019-12-31 LAB — CBC
HCT: 28.5 % — ABNORMAL LOW (ref 36.0–46.0)
Hemoglobin: 9.3 g/dL — ABNORMAL LOW (ref 12.0–15.0)
MCH: 31.6 pg (ref 26.0–34.0)
MCHC: 32.6 g/dL (ref 30.0–36.0)
MCV: 96.9 fL (ref 80.0–100.0)
Platelets: 176 10*3/uL (ref 150–400)
RBC: 2.94 MIL/uL — ABNORMAL LOW (ref 3.87–5.11)
RDW: 13.5 % (ref 11.5–15.5)
WBC: 6.5 10*3/uL (ref 4.0–10.5)
nRBC: 0 % (ref 0.0–0.2)

## 2019-12-31 LAB — BRAIN NATRIURETIC PEPTIDE: B Natriuretic Peptide: 368.2 pg/mL — ABNORMAL HIGH (ref 0.0–100.0)

## 2019-12-31 MED ORDER — ENSURE ENLIVE PO LIQD
237.0000 mL | Freq: Two times a day (BID) | ORAL | Status: DC
  Administered 2020-01-02 – 2020-01-04 (×5): 237 mL via ORAL

## 2019-12-31 MED ORDER — ONDANSETRON HCL 4 MG/2ML IJ SOLN
4.0000 mg | Freq: Four times a day (QID) | INTRAMUSCULAR | Status: DC | PRN
  Administered 2019-12-31 – 2020-01-01 (×2): 4 mg via INTRAVENOUS
  Filled 2019-12-31 (×2): qty 2

## 2019-12-31 MED ORDER — PROCHLORPERAZINE EDISYLATE 10 MG/2ML IJ SOLN
5.0000 mg | INTRAMUSCULAR | Status: DC | PRN
  Administered 2019-12-31: 5 mg via INTRAVENOUS
  Filled 2019-12-31: qty 2

## 2019-12-31 MED ORDER — ENOXAPARIN SODIUM 30 MG/0.3ML ~~LOC~~ SOLN
30.0000 mg | Freq: Once | SUBCUTANEOUS | Status: AC
  Administered 2019-12-31: 30 mg via SUBCUTANEOUS
  Filled 2019-12-31: qty 0.3

## 2019-12-31 NOTE — Consult Note (Signed)
Cardiology Consultation:   Patient ID: Sara Garza MRN: [id number]; DOB: [birth year]  Admit date: 12/30/2019 Date of Consult: 12/31/2019  Primary Care Provider: [name], NP [facility] Cardiologist: Sherren Mocha, MD  [place] Electrophysiologist:  None    Patient Profile:   Sara Garza is a 86 y.o. female with a hx of CAD, AS s/p [name] in 9622, chronic diastolic CHF, HTN, HLD, DM2, obesity, osteoporosis, CKD stage III who is being seen today for the evaluation of CHF at the request of Dr. Sabino Gasser.  History of Present Illness:   Sara Garza is followed by Dr. Burt Knack for the above cardiac issues. She underwent [name] for treated of severe symptomatic AS in 2014. She was treated with 77mm Medtronic Corevalve through the moderate risk SurTAVI AS trial. She is followed for moderate CAD which was evaluated with pressure wire analysis of the RCA and LCx demonstrating no hemodynamic evidence of flow-obstruction. 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   The patient was last seen 10/14/19 for regular follow-up. She reported stable symptoms of DOE and leg swelling. Rare chest pain episodes reported. Reported urinary incontinence, especially on the days she takes lasix. Recommended she only take lasix as needed.   The patient presented to the ED 9/30 for a mechanical fall. She was walking outside to the dumpster when she lost her balance and fell on her left hip. She had immediate sharp pain. Denies chest pain, sob, headache, head injury, palpitations, nausea, vomiting.  No dizziness or lightheadedness prior to the fall. Ems was called and she was given fentanyl and brought to the ER.   In the ED BP 143/54, pulse 73, afebrile, RR 18, 99% O2. Labs showed stable electrolytes, creatinine 1.29, BUN 37, WBC 5.3, Hgb 10.2. Respiratory panel was negative. EKG showed NSR, LBBB PACs, and no acute changes.  CXR unremarkable. Further imaging showed left hip fracture and patient was admitted.     Past Medical History:  Diagnosis Date  . Arthritis   . CAD (coronary artery disease)   . Carotid stenosis, left    50-60% stable  . Diabetes mellitus   . Glaucoma   . Hyperlipidemia   . Hypertension   . Leg pain    ABIs 2/18: normal bilaterally.  . Osteoporosis   . Valvular heart disease    Aortic Stenosis s/p [name] 2014 // Mitral stenosis // Echo 09/2018: EF > 65, mod LVH, severe focal basal hypertrophy, RVSP 39.8, mild to mod MR, mod to severe MS (mean 9), AVR with mild AI, severe LAE (similar to prior echo)    Past Surgical History:  Procedure Laterality Date  . ANOMALOUS PULMONARY VENOUS RETURN REPAIR, TOTAL    . APPENDECTOMY    . CARDIAC VALVE REPLACEMENT  04/29/2012  . Cataracts Bilateral   . CESAREAN SECTION    . FOOT SURGERY     [name]  . HIP ARTHROPLASTY Right 07/31/2013   Procedure: ARTHROPLASTY BIPOLAR HIP;  Surgeon: [name], MD;  Location: [facility] ORS;  Service: Orthopedics;  Laterality: Right;  . PERCUTANEOUS CORONARY STENT INTERVENTION (PCI-S) N/A 01/02/2012   Procedure: PERCUTANEOUS CORONARY STENT INTERVENTION (PCI-S);  Surgeon: Sherren Mocha, MD;  Location: [facility] CATH LAB;  Service: Cardiovascular;  Laterality: N/A;     Home Medications:  Prior to Admission medications   Medication Sig Start Date End Date Taking? Authorizing Provider  acetaminophen (TYLENOL) 500 MG tablet Take 1,000  mg by mouth in the morning and at bedtime.   Yes [provider]  amLODipine (NORVASC) 5 MG tablet TAKE 1 AND 1/2 TABLETS(7.5 MG) BY MOUTH DAILY Patient taking differently: Take 7.5 mg by mouth daily.  09/28/19  Yes Nafziger, [name], NP  amoxicillin (AMOXIL) 500 MG capsule TK 4 CAPSULES PO 1 HOUR PRIOR TO DENTAL PROCEDURES 02/02/19  Yes Sherren Mocha, MD  aspirin EC 81 MG tablet Take 1 tablet (81 mg total) by mouth daily. Start after lovenox injections are done after 2 weeks 08/03/13  Yes  Rai, Ripudeep K, MD  bimatoprost (LUMIGAN) 0.01 % SOLN Place 1 drop into both eyes at bedtime.    Yes [provider]  Calcium Carb-Cholecalciferol (CALCIUM 500 +D) 500-400 MG-UNIT TABS Take 1 tablet by mouth daily.    Yes [provider]  carvedilol (COREG) 3.125 MG tablet TAKE 1 TABLET(3.125 MG) BY MOUTH TWICE DAILY WITH A MEAL 09/16/19  Yes Nafziger, [name], NP  ezetimibe (ZETIA) 10 MG tablet TAKE 1 TABLET(10 MG) BY MOUTH DAILY Patient taking differently: Take 10 mg by mouth daily.  11/03/19  Yes Sherren Mocha, MD  furosemide (LASIX) 20 MG tablet Take 1 tablet (20 mg total) by mouth daily. Patient taking differently: Take 10-20 mg by mouth daily as needed for fluid.  04/19/19 04/13/20 Yes Sherren Mocha, MD  isosorbide mononitrate (IMDUR) 60 MG 24 hr tablet TAKE 1 TABLET(60 MG) BY MOUTH DAILY Patient taking differently: Take 60 mg by mouth daily.  04/05/19  Yes Weaver, Scott T, PA-C  losartan (COZAAR) 25 MG tablet TAKE 1 TABLET(25 MG) BY MOUTH DAILY Patient taking differently: Take 25 mg by mouth daily.  08/25/19  Yes Nafziger, [name], NP  Multiple Vitamin (MULTIVITAMIN) tablet Take 1 tablet by mouth daily.     Yes [provider]  Multiple Vitamins-Minerals (PRESERVISION AREDS 2+MULTI VIT PO) Take 2 capsules by mouth in the morning and at bedtime.   Yes [provider]  oxybutynin (DITROPAN-XL) 5 MG 24 hr tablet TAKE 1 TABLET(5 MG) BY MOUTH AT BEDTIME Patient taking differently: Take 5 mg by mouth at bedtime.  12/02/19   [name], NP    Inpatient Medications: Scheduled Meds: . amLODipine  7.5 mg Oral Daily  . aspirin EC  81 mg Oral Daily  . calcium-vitamin D  1 tablet Oral Q breakfast  . carvedilol  6.25 mg Oral BID WC  . enoxaparin (LOVENOX) injection  30 mg Subcutaneous Q24H  . ezetimibe  10 mg Oral Daily  . isosorbide mononitrate  60 mg Oral Daily  . latanoprost  1 drop Both Eyes QHS  . losartan  25 mg Oral Daily  . multivitamin with minerals  1  tablet Oral Daily   Continuous Infusions:  PRN Meds: acetaminophen, HYDROcodone-acetaminophen, HYDROmorphone (DILAUDID) injection  Allergies:    Allergies  Allergen Reactions  . Statins Other (See Comments)    Muscle aches  . Gabapentin Other (See Comments)    SOMNOLENCE    Social History:   Social History   Socioeconomic History  . Marital status: Widowed    Spouse name: Not on file  . Number of children: Not on file  . Years of education: Not on file  . Highest education level: Not on file  Occupational History  . Not on file  Tobacco Use  . Smoking status: Passive Smoke Exposure - Never Smoker  . Smokeless tobacco: Never Used  Substance and Sexual Activity  . Alcohol use: Yes    Alcohol/week: 0.0  standard drinks    Comment: very seldom  . Drug use: No  . Sexual activity: Not Currently  Other Topics Concern  . Not on file  Social History Narrative   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      She likes to read and she takes classes at [place] Strain: High Risk  . Difficulty of Paying Living Expenses: Hard  Food Insecurity:   . Worried About Charity fundraiser in the Last Year: Not on file  . Ran Out of Food in the Last Year: Not on file  Transportation Needs: No Transportation Needs  . Lack of Transportation (Medical): No  . Lack of Transportation (Non-Medical): No  Physical Activity:   . Days of Exercise per Week: Not on file  . Minutes of Exercise per Session: Not on file  Stress:   . Feeling of Stress : Not on file  Social Connections:   . Frequency of Communication with Friends and Family: Not on file  . Frequency of Social Gatherings with Friends and Family: Not on file  . Attends Religious Services: Not on file  . Active Member of Clubs or Organizations: Not on file  . Attends Archivist Meetings: Not on file  . Marital Status: Not on file    Intimate Partner Violence:   . Fear of Current or Ex-Partner: Not on file  . Emotionally Abused: Not on file  . Physically Abused: Not on file  . Sexually Abused: Not on file    Family History:   Family History  Problem Relation Age of Onset  . COPD Father   . Cancer Father        lung cancer  . Lung cancer Other      ROS:  Please see the history of present illness.  All other ROS reviewed and negative.     Physical Exam/Data:   Vitals:   12/30/19 2234 12/31/19 0221 12/31/19 0631 12/31/19 0820  BP: 124/70 120/61 (!) 135/53 122/70  Pulse: 73 72 75 83  Resp: 17 15 18    Temp: 97.7 F (36.5 C) 97.8 F (36.6 C) 98.2 F (36.8 C)   TempSrc: Oral Oral Oral   SpO2: 97% 97% 98%   Weight:        Intake/Output Summary (Last 24 hours) at 12/31/2019 0848 Last data filed at 12/31/2019 0725 Gross per 24 hour  Intake --  Output 700 ml  Net -700 ml   Last 3 Weights 12/30/2019 12/01/2019 10/14/2019  Weight (lbs) 158 lb 158 lb 3.2 oz 157 lb 12.8 oz  Weight (kg) 71.668 kg 71.759 kg 71.578 kg     Body mass index is 34.19 kg/m.  General:  Well nourished, well developed, in no acute distress HEENT: normal Lymph: no adenopathy Neck: no JVD Endocrine:  No thryomegaly Vascular: No carotid bruits; FA pulses 2+ bilaterally without bruits  Cardiac:  normal S1, S2; RRR; no murmur  Lungs:  clear to auscultation bilaterally, no wheezing, rhonchi or rales  Abd: soft, nontender, no hepatomegaly  Ext: mild B/L edema Musculoskeletal:  No deformities, BUE and BLE strength normal and equal Skin: warm and dry  Neuro:  CNs 2-12 intact, no focal abnormalities noted Psych:  Normal affect   EKG:  The EKG was personally reviewed and demonstrates:  NSr, LBBB, PACs, no acute changes Telemetry:  Telemetry was personally reviewed  and demonstrates:  NSR, HR 60-70, biref 1st degree AV block  Relevant CV Studies:  Echo 09/2019  1. Aneursym of the mid-intraventricular septum, neck measures 1.5 cm,   contained. Left ventricular ejection fraction, by estimation, is 65 to  70%. The left ventricle has normal function. The left ventricle has no  regional wall motion abnormalities. There  is severe asymmetric left ventricular hypertrophy of the basal-septal  segment. Left ventricular diastolic parameters are consistent with Grade I  diastolic dysfunction (impaired relaxation). Elevated left ventricular  end-diastolic pressure.  2. Right ventricular systolic function is hyperdynamic. The right  ventricular size is normal. There is normal pulmonary artery systolic  pressure.  3. Left atrial size was severely dilated.  4. The mitral valve is abnormal. Mild mitral valve regurgitation. Mild to  moderate mitral stenosis. The mean mitral valve gradient is 7.5 mmHg with  average heart rate of 70 bpm.  5. The aortic valve has been repaired/replaced. Aortic valve  regurgitation is not visualized. There is a 23 mm Medtronic  CoreValve-Evolut Pro prosthetic ([name]) valve present in the aortic  position. Aortic valve area, by VTI measures 1.21 cm. Aortic  valve mean gradient measures 13.7 mmHg. Aortic valve Vmax measures 2.59  m/s. Peak gradient 27 mmHg.  6. The inferior vena cava is normal in size with greater than 50%  respiratory variability, suggesting right atrial pressure of 3 mmHg.  7. Evidence of atrial level shunting detected by color flow Doppler.  There is a small patent foramen ovale with predominantly left to right  shunting across the atrial septum.    Laboratory Data:  High Sensitivity Troponin:  No results for input(s): TROPONINIHS in the last 720 hours.   Chemistry Recent Labs  Lab 12/30/19 1429 12/31/19 0554  NA 141 140  K 4.3 5.2*  CL 109 106  CO2 21* 24  GLUCOSE 99 135*  BUN 37* 37*  CREATININE 1.29* 1.45*  CALCIUM 9.0 8.9  GFRNONAA 35* 31*  GFRAA 41* 36*  ANIONGAP 11 10    No results for input(s): PROT, ALBUMIN, AST, ALT, ALKPHOS, BILITOT in the last 168  hours. Hematology Recent Labs  Lab 12/30/19 1429 12/31/19 0554  WBC 5.3 6.5  RBC 3.31* 2.94*  HGB 10.2* 9.3*  HCT 31.4* 28.5*  MCV 94.9 96.9  MCH 30.8 31.6  MCHC 32.5 32.6  RDW 13.4 13.5  PLT 178 176   BNPNo results for input(s): BNP, PROBNP in the last 168 hours.  DDimer No results for input(s): DDIMER in the last 168 hours.   Radiology/Studies:  DG Pelvis 1-2 Views  Result Date: 12/30/2019 CLINICAL DATA:  Pt c/o left groin pain s/p fall today while carrying trash to dumpster. EXAM: PELVIS - 1-2 VIEW; DG HIP (WITH OR WITHOUT PELVIS) 2-3V LEFT; LEFT FEMUR 2 VIEWS COMPARISON:  X-ray pelvis 07/31/2013. FINDINGS: Acute, comminuted, left femoral intertrochanteric fracture. No left hip dislocation. No acute fracture or dislocation of the left femur distally. The left knee is visualized on the AP view and demonstrates severe medial tibiofemoral compartment degenerative changes with marked joint space narrowing and osteophyte marginal formation. There is no evidence of pelvic fracture or diastasis. No pelvic bone lesions are seen. Visualized portions of the lower lumbar spine demonstrates degenerative changes. Partially visualized sacrum is grossly unremarkable with limited evaluation due to overlying bowel. Right hip arthroplasty. The surgical hardware femoral head component appears to be in alignment with the acetabulum. No evidence of proximal right femoral fracture. Vascular calcifications. Similar-appearing peripherally calcified calcification  overlying the left pelvis. Right inguinal region surgical clips. Round calcification overlying the region of the left gluteus soft tissues likely represents an injection granuloma. IMPRESSION: 1. Acute comminuted left femoral intertrochanteric fracture. No left hip dislocation. 2. Right hip arthroplasty with no radiographic evidence of surgical hardware complication. 3. No acute displaced fracture or diastasis of the bones of the pelvis. 4. Severe left  knee degenerative changes with no findings to suggest acute displaced fracture or dislocation of the bones of the distal left femur. Please note limited evaluation of the left knee with only a single AP view. Electronically Signed   By: Iven Finn M.D.   On: 12/30/2019 15:38   DG Chest Portable 1 View  Result Date: 12/30/2019 CLINICAL DATA:  Fall EXAM: PORTABLE CHEST 1 VIEW COMPARISON:  Chest x-ray 03/23/2018. FINDINGS: The heart size and mediastinal contours are unchanged. Aortic valve replacement. Mitral annular calcifications again noted. Aortic arch calcifications. No focal consolidation. No pulmonary edema. No pleural effusion. No pneumothorax. Multilevel degenerative changes of the visualized spine. Degenerative changes of bilateral shoulders. No acute displaced fracture. IMPRESSION: No active cardiopulmonary disease. Electronically Signed   By: Iven Finn M.D.   On: 12/30/2019 15:41   DG Hip Unilat W or Wo Pelvis 2-3 Views Left  Result Date: 12/30/2019 CLINICAL DATA:  Pt c/o left groin pain s/p fall today while carrying trash to dumpster. EXAM: PELVIS - 1-2 VIEW; DG HIP (WITH OR WITHOUT PELVIS) 2-3V LEFT; LEFT FEMUR 2 VIEWS COMPARISON:  X-ray pelvis 07/31/2013. FINDINGS: Acute, comminuted, left femoral intertrochanteric fracture. No left hip dislocation. No acute fracture or dislocation of the left femur distally. The left knee is visualized on the AP view and demonstrates severe medial tibiofemoral compartment degenerative changes with marked joint space narrowing and osteophyte marginal formation. There is no evidence of pelvic fracture or diastasis. No pelvic bone lesions are seen. Visualized portions of the lower lumbar spine demonstrates degenerative changes. Partially visualized sacrum is grossly unremarkable with limited evaluation due to overlying bowel. Right hip arthroplasty. The surgical hardware femoral head component appears to be in alignment with the acetabulum. No evidence of  proximal right femoral fracture. Vascular calcifications. Similar-appearing peripherally calcified calcification overlying the left pelvis. Right inguinal region surgical clips. Round calcification overlying the region of the left gluteus soft tissues likely represents an injection granuloma. IMPRESSION: 1. Acute comminuted left femoral intertrochanteric fracture. No left hip dislocation. 2. Right hip arthroplasty with no radiographic evidence of surgical hardware complication. 3. No acute displaced fracture or diastasis of the bones of the pelvis. 4. Severe left knee degenerative changes with no findings to suggest acute displaced fracture or dislocation of the bones of the distal left femur. Please note limited evaluation of the left knee with only a single AP view. Electronically Signed   By: Iven Finn M.D.   On: 12/30/2019 15:38   DG Femur Min 2 Views Left  Result Date: 12/30/2019 CLINICAL DATA:  Pt c/o left groin pain s/p fall today while carrying trash to dumpster. EXAM: PELVIS - 1-2 VIEW; DG HIP (WITH OR WITHOUT PELVIS) 2-3V LEFT; LEFT FEMUR 2 VIEWS COMPARISON:  X-ray pelvis 07/31/2013. FINDINGS: Acute, comminuted, left femoral intertrochanteric fracture. No left hip dislocation. No acute fracture or dislocation of the left femur distally. The left knee is visualized on the AP view and demonstrates severe medial tibiofemoral compartment degenerative changes with marked joint space narrowing and osteophyte marginal formation. There is no evidence of pelvic fracture or diastasis. No pelvic bone lesions  are seen. Visualized portions of the lower lumbar spine demonstrates degenerative changes. Partially visualized sacrum is grossly unremarkable with limited evaluation due to overlying bowel. Right hip arthroplasty. The surgical hardware femoral head component appears to be in alignment with the acetabulum. No evidence of proximal right femoral fracture. Vascular calcifications. Similar-appearing  peripherally calcified calcification overlying the left pelvis. Right inguinal region surgical clips. Round calcification overlying the region of the left gluteus soft tissues likely represents an injection granuloma. IMPRESSION: 1. Acute comminuted left femoral intertrochanteric fracture. No left hip dislocation. 2. Right hip arthroplasty with no radiographic evidence of surgical hardware complication. 3. No acute displaced fracture or diastasis of the bones of the pelvis. 4. Severe left knee degenerative changes with no findings to suggest acute displaced fracture or dislocation of the bones of the distal left femur. Please note limited evaluation of the left knee with only a single AP view. Electronically Signed   By: Iven Finn M.D.   On: 12/30/2019 15:38   {  Assessment and Plan:   Left hip fx - due to mechanical fall. No prodrome prior to fall - Ortho following>>plan for ORIF tomorrow  Acute on chronic CHF - Noted to be volume overloaded on exam - PTA Patient unable to take lasix daily due to incontinence - Echo 09/2019 with LVEF 65-70%, G1DD - CXR unremarkable. Will check a BNP - Given lasix 20 mg x 1 - coreg increased to 6.25mg  BID. Continue Losartan, amlodipine - Patient feels breathing and LLE are stable - Has mild LLE. Can give lasix as needed. Monitor fluid status post-op period  Moderate CAD - No chest pain - continue amlodipine, coreg, Imdur  CKD stage III - monitor creatinine with diuresis - baseline 1.2-1.4  AS s/p [name] 2014 - echo 09/2019 showed stable valve  HTN - PTA Isosorbide, Losartan, coreg - coreg increased as above - Bps stable  Pre-op cardiac eval - Overall patient appears stable from a cardiac perspective. Breathing stable. Has mild LLE on exam for which lasix can be given - No chest pain - Echo with stable valve function - PTA patient was very functional for her age. PTA was able to perform ADLs  - METS >5 - According to Revised Risk Index she  is Class II risk at 6% for 30 day risk of death, MI, cardiac arrest. Overall likely okay to proceed with surgery at low to moderate risk. Continue to monitor fluid status post-op period.  For questions or updates, please contact [place] Please consult [email or web address] for contact info under    Signed, Beau Ramsburg Ninfa Meeker, PA-C  12/31/2019 8:48 AM

## 2019-12-31 NOTE — H&P (View-Only) (Signed)
ORTHOPAEDIC CONSULTATION  REQUESTING PHYSICIAN: Dwyane Dee, MD  PCP:  [name], NP  Chief Complaint: Left hip pain  HPI:   Sara Garza is a 86 y.o. female complaining of left hip pain with medical history significant of chronic diastolic CHF, aortic stenosis status post [name] in 2014, hypertension, CKD stage III, presented with mechanical fall.  Patient tripped and fell on her left hip while walking to a dumpster on Thursday.  She denied any prodromes of lightheaded or blurred vision before she fell down and denied any chest pain or shortness of breath  Past Medical History:  Diagnosis Date  . Arthritis   . CAD (coronary artery disease)   . Carotid stenosis, left    50-60% stable  . Diabetes mellitus   . Glaucoma   . Hyperlipidemia   . Hypertension   . Leg pain    ABIs 2/18: normal bilaterally.  . Osteoporosis   . Valvular heart disease    Aortic Stenosis s/p [name] 2014 // Mitral stenosis // Echo 09/2018: EF > 65, mod LVH, severe focal basal hypertrophy, RVSP 39.8, mild to mod MR, mod to severe MS (mean 9), AVR with mild AI, severe LAE (similar to prior echo)   Past Surgical History:  Procedure Laterality Date  . ANOMALOUS PULMONARY VENOUS RETURN REPAIR, TOTAL    . APPENDECTOMY    . CARDIAC VALVE REPLACEMENT  04/29/2012  . Cataracts Bilateral   . CESAREAN SECTION    . FOOT SURGERY     [name]  . HIP ARTHROPLASTY Right 07/31/2013   Procedure: ARTHROPLASTY BIPOLAR HIP;  Surgeon: [name], MD;  Location: [facility];  Service: Orthopedics;  Laterality: Right;  . PERCUTANEOUS CORONARY STENT INTERVENTION (PCI-S) N/A 01/02/2012   Procedure: PERCUTANEOUS CORONARY STENT INTERVENTION (PCI-S);  Surgeon: Sherren Mocha, MD;  Location: [facility] CATH LAB;  Service: Cardiovascular;  Laterality: N/A;   Social History   Socioeconomic History  . Marital status: Widowed    Spouse name: Not on file  . Number of children: Not on file  . Years of education: Not on file  . Highest  education level: Not on file  Occupational History  . Not on file  Tobacco Use  . Smoking status: Passive Smoke Exposure - Never Smoker  . Smokeless tobacco: Never Used  Substance and Sexual Activity  . Alcohol use: Yes    Alcohol/week: 0.0 standard drinks    Comment: very seldom  . Drug use: No  . Sexual activity: Not Currently  Other Topics Concern  . Not on file  Social History Narrative   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      She likes to read and she takes classes at [employer] Strain: High Risk  . Difficulty of Paying Living Expenses: Hard  Food Insecurity:   . Worried About Charity fundraiser in the Last Year: Not on file  . Ran Out of Food in the Last Year: Not on file  Transportation Needs: No Transportation Needs  . Lack of Transportation (Medical): No  . Lack of Transportation (Non-Medical): No  Physical Activity:   . Days of Exercise per Week: Not on file  . Minutes of Exercise per Session: Not on file  Stress:   . Feeling of Stress : Not on file  Social Connections:   . Frequency of Communication with Friends and  Family: Not on file  . Frequency of Social Gatherings with Friends and Family: Not on file  . Attends Religious Services: Not on file  . Active Member of Clubs or Organizations: Not on file  . Attends Archivist Meetings: Not on file  . Marital Status: Not on file   Family History  Problem Relation Age of Onset  . COPD Father   . Cancer Father        lung cancer  . Lung cancer Other    Allergies  Allergen Reactions  . Statins Other (See Comments)    Muscle aches  . Gabapentin Other (See Comments)    SOMNOLENCE   Prior to Admission medications   Medication Sig Start Date End Date Taking? Authorizing Provider  acetaminophen (TYLENOL) 500 MG tablet Take 1,000 mg by mouth in the morning and at bedtime.   Yes [provider]    amLODipine (NORVASC) 5 MG tablet TAKE 1 AND 1/2 TABLETS(7.5 MG) BY MOUTH DAILY Patient taking differently: Take 7.5 mg by mouth daily.  09/28/19  Yes Nafziger, [name], NP  amoxicillin (AMOXIL) 500 MG capsule TK 4 CAPSULES PO 1 HOUR PRIOR TO DENTAL PROCEDURES 02/02/19  Yes Sherren Mocha, MD  aspirin EC 81 MG tablet Take 1 tablet (81 mg total) by mouth daily. Start after lovenox injections are done after 2 weeks 08/03/13  Yes Rai, Ripudeep K, MD  bimatoprost (LUMIGAN) 0.01 % SOLN Place 1 drop into both eyes at bedtime.    Yes [provider]  Calcium Carb-Cholecalciferol (CALCIUM 500 +D) 500-400 MG-UNIT TABS Take 1 tablet by mouth daily.    Yes [provider]  carvedilol (COREG) 3.125 MG tablet TAKE 1 TABLET(3.125 MG) BY MOUTH TWICE DAILY WITH A MEAL 09/16/19  Yes Nafziger, [name], NP  ezetimibe (ZETIA) 10 MG tablet TAKE 1 TABLET(10 MG) BY MOUTH DAILY Patient taking differently: Take 10 mg by mouth daily.  11/03/19  Yes Sherren Mocha, MD  furosemide (LASIX) 20 MG tablet Take 1 tablet (20 mg total) by mouth daily. Patient taking differently: Take 10-20 mg by mouth daily as needed for fluid.  04/19/19 04/13/20 Yes Sherren Mocha, MD  isosorbide mononitrate (IMDUR) 60 MG 24 hr tablet TAKE 1 TABLET(60 MG) BY MOUTH DAILY Patient taking differently: Take 60 mg by mouth daily.  04/05/19  Yes Weaver, Scott T, PA-C  losartan (COZAAR) 25 MG tablet TAKE 1 TABLET(25 MG) BY MOUTH DAILY Patient taking differently: Take 25 mg by mouth daily.  08/25/19  Yes Nafziger, [name], NP  Multiple Vitamin (MULTIVITAMIN) tablet Take 1 tablet by mouth daily.     Yes [provider]  Multiple Vitamins-Minerals (PRESERVISION AREDS 2+MULTI VIT PO) Take 2 capsules by mouth in the morning and at bedtime.   Yes [provider]  oxybutynin (DITROPAN-XL) 5 MG 24 hr tablet TAKE 1 TABLET(5 MG) BY MOUTH AT BEDTIME Patient taking differently: Take 5 mg by mouth at bedtime.  12/02/19   [name], NP   DG  Pelvis 1-2 Views  Result Date: 12/30/2019 CLINICAL DATA:  Pt c/o left groin pain s/p fall today while carrying trash to dumpster. EXAM: PELVIS - 1-2 VIEW; DG HIP (WITH OR WITHOUT PELVIS) 2-3V LEFT; LEFT FEMUR 2 VIEWS COMPARISON:  X-ray pelvis 07/31/2013. FINDINGS: Acute, comminuted, left femoral intertrochanteric fracture. No left hip dislocation. No acute fracture or dislocation of the left femur distally. The left knee is visualized on the AP view and demonstrates severe medial tibiofemoral compartment degenerative changes with marked joint space  narrowing and osteophyte marginal formation. There is no evidence of pelvic fracture or diastasis. No pelvic bone lesions are seen. Visualized portions of the lower lumbar spine demonstrates degenerative changes. Partially visualized sacrum is grossly unremarkable with limited evaluation due to overlying bowel. Right hip arthroplasty. The surgical hardware femoral head component appears to be in alignment with the acetabulum. No evidence of proximal right femoral fracture. Vascular calcifications. Similar-appearing peripherally calcified calcification overlying the left pelvis. Right inguinal region surgical clips. Round calcification overlying the region of the left gluteus soft tissues likely represents an injection granuloma. IMPRESSION: 1. Acute comminuted left femoral intertrochanteric fracture. No left hip dislocation. 2. Right hip arthroplasty with no radiographic evidence of surgical hardware complication. 3. No acute displaced fracture or diastasis of the bones of the pelvis. 4. Severe left knee degenerative changes with no findings to suggest acute displaced fracture or dislocation of the bones of the distal left femur. Please note limited evaluation of the left knee with only a single AP view. Electronically Signed   By: Iven Finn M.D.   On: 12/30/2019 15:38   DG Chest Portable 1 View  Result Date: 12/30/2019 CLINICAL DATA:  Fall EXAM: PORTABLE  CHEST 1 VIEW COMPARISON:  Chest x-ray 03/23/2018. FINDINGS: The heart size and mediastinal contours are unchanged. Aortic valve replacement. Mitral annular calcifications again noted. Aortic arch calcifications. No focal consolidation. No pulmonary edema. No pleural effusion. No pneumothorax. Multilevel degenerative changes of the visualized spine. Degenerative changes of bilateral shoulders. No acute displaced fracture. IMPRESSION: No active cardiopulmonary disease. Electronically Signed   By: Iven Finn M.D.   On: 12/30/2019 15:41   DG Hip Unilat W or Wo Pelvis 2-3 Views Left  Result Date: 12/30/2019 CLINICAL DATA:  Pt c/o left groin pain s/p fall today while carrying trash to dumpster. EXAM: PELVIS - 1-2 VIEW; DG HIP (WITH OR WITHOUT PELVIS) 2-3V LEFT; LEFT FEMUR 2 VIEWS COMPARISON:  X-ray pelvis 07/31/2013. FINDINGS: Acute, comminuted, left femoral intertrochanteric fracture. No left hip dislocation. No acute fracture or dislocation of the left femur distally. The left knee is visualized on the AP view and demonstrates severe medial tibiofemoral compartment degenerative changes with marked joint space narrowing and osteophyte marginal formation. There is no evidence of pelvic fracture or diastasis. No pelvic bone lesions are seen. Visualized portions of the lower lumbar spine demonstrates degenerative changes. Partially visualized sacrum is grossly unremarkable with limited evaluation due to overlying bowel. Right hip arthroplasty. The surgical hardware femoral head component appears to be in alignment with the acetabulum. No evidence of proximal right femoral fracture. Vascular calcifications. Similar-appearing peripherally calcified calcification overlying the left pelvis. Right inguinal region surgical clips. Round calcification overlying the region of the left gluteus soft tissues likely represents an injection granuloma. IMPRESSION: 1. Acute comminuted left femoral intertrochanteric fracture. No  left hip dislocation. 2. Right hip arthroplasty with no radiographic evidence of surgical hardware complication. 3. No acute displaced fracture or diastasis of the bones of the pelvis. 4. Severe left knee degenerative changes with no findings to suggest acute displaced fracture or dislocation of the bones of the distal left femur. Please note limited evaluation of the left knee with only a single AP view. Electronically Signed   By: Iven Finn M.D.   On: 12/30/2019 15:38   DG Femur Min 2 Views Left  Result Date: 12/30/2019 CLINICAL DATA:  Pt c/o left groin pain s/p fall today while carrying trash to dumpster. EXAM: PELVIS - 1-2 VIEW; DG HIP (WITH OR  WITHOUT PELVIS) 2-3V LEFT; LEFT FEMUR 2 VIEWS COMPARISON:  X-ray pelvis 07/31/2013. FINDINGS: Acute, comminuted, left femoral intertrochanteric fracture. No left hip dislocation. No acute fracture or dislocation of the left femur distally. The left knee is visualized on the AP view and demonstrates severe medial tibiofemoral compartment degenerative changes with marked joint space narrowing and osteophyte marginal formation. There is no evidence of pelvic fracture or diastasis. No pelvic bone lesions are seen. Visualized portions of the lower lumbar spine demonstrates degenerative changes. Partially visualized sacrum is grossly unremarkable with limited evaluation due to overlying bowel. Right hip arthroplasty. The surgical hardware femoral head component appears to be in alignment with the acetabulum. No evidence of proximal right femoral fracture. Vascular calcifications. Similar-appearing peripherally calcified calcification overlying the left pelvis. Right inguinal region surgical clips. Round calcification overlying the region of the left gluteus soft tissues likely represents an injection granuloma. IMPRESSION: 1. Acute comminuted left femoral intertrochanteric fracture. No left hip dislocation. 2. Right hip arthroplasty with no radiographic evidence of  surgical hardware complication. 3. No acute displaced fracture or diastasis of the bones of the pelvis. 4. Severe left knee degenerative changes with no findings to suggest acute displaced fracture or dislocation of the bones of the distal left femur. Please note limited evaluation of the left knee with only a single AP view. Electronically Signed   By: Iven Finn M.D.   On: 12/30/2019 15:38    Positive ROS: All other systems have been reviewed and were otherwise negative with the exception of those mentioned in the HPI and as above.  Physical Exam: General: Alert, no acute distress Cardiovascular: No pedal edema Respiratory: No cyanosis, no use of accessory musculature GI: No organomegaly, abdomen is soft and non-tender Skin: No lesions in the area of chief complaint Neurologic: Sensation intact distally Psychiatric: Patient is competent for consent with normal mood and affect Lymphatic: No axillary or cervical lymphadenopathy  MUSCULOSKELETAL: Inspection shows no skin wounds or lesions. Left leg shortened and rotated. Dorsiflexion and plantar flexion intact. Pedal pulse present  Assessment: Closed fracture of femur, intertrochanteric, left   Plan: Left hip fracture: Surgery Saturday AM with Dr. Lyla Glassing.  NPO after midnight tonight CHF, CKD, aortic stenosis s/p [name], HTN:  Treat per hospitalist    [name], PA [phone]    12/31/2019 1:22 PM

## 2019-12-31 NOTE — Assessment & Plan Note (Addendum)
-   s/p mechanical fall at home - s/p IM nail fixation with ortho on 10/2, patient tolerated surgery well -PT recommending SNF for ongoing rehab at discharge.  Daughter stated that they did not have adequate help or care at home and also would be considering rehab at discharge - ortho recs are WBAT with walker - Lovenox while hospitalized then asa 81 mg BID x 6 weeks - continue pain control

## 2019-12-31 NOTE — Assessment & Plan Note (Signed)
-  Baseline creatinine 1.3-1.4 -Currently at baseline on admission

## 2019-12-31 NOTE — Assessment & Plan Note (Signed)
-   appreciate cardiology evaluation and assistance with clearance - caution with volume status; she is noted to have chronic LE edema and DOE - PRN lasix per cardiology  - see cardiology note; overall stable for surgery from cardiac standpoint

## 2019-12-31 NOTE — Progress Notes (Addendum)
PROGRESS NOTE    Sara Garza   BDZ:[id number]  DOB: [birth year]  DOA: 12/30/2019     1  PCP: [name], NP  CC: fall at home  Hospital Course: Sara Garza is a 86 y.o. female with medical history significant of chronic diastolic CHF, aortic stenosis status post [name] in 2014, hypertension, CKD stage III who presented with mechanical fall.   Patient tripped and fell on her left hip while walking to a dumpster.  She denied any prodromes of lightheaded or blurred vision before she fell down and denied any chest pain or shortness of breath.     She follows with cardiology for her CHF and [name], most recent echo in July 2021 showed EF 65 to 70% with asymmetrical wall thickening and diastolic dysfunction.   In the ER she underwent multiple xrays which showed "Acute comminuted left femoral ntertrochanteric fracture. No left hip dislocation."  She was evaluated by orthopedic surgery with tentative plans for repair on 01/01/2020. Cardiology was also consulted for further cardiac clearance prior to surgery.   Interval History:  Seen this morning resting in bed comfortably in no distress.  Does have pain in her left hip with movement but otherwise is stable and still planning for tentative surgery tomorrow.  Old records reviewed in assessment of this patient  ROS: Constitutional: negative for chills and fevers, Respiratory: negative for cough, Cardiovascular: negative for chest pain and Gastrointestinal: negative for abdominal pain  Assessment & Plan: Closed fracture of femur, intertrochanteric, left, initial encounter ([place]) - s/p mechanical fall at home - tentative plan for surgery on 10/2 - NPO at [facility] tonight  - continue pain control - PT and DVT ppx after surgery   Acute on chronic diastolic CHF (congestive heart failure) ([place]) - appreciate cardiology evaluation and assistance with clearance - caution with volume status; she is noted to have chronic LE edema and DOE - PRN  lasix per cardiology  - see cardiology note; overall stable for surgery from cardiac standpoint  CKD (chronic kidney disease), stage III (H[facility]) -Baseline creatinine 1.3-1.4 -Currently at baseline on admission  Benign essential HTN - continue current regimen; BP stable - parameters placed on meds in case of hypoTN  Type 2 diabetes mellitus with renal manifestations, controlled ([place]) -Well-controlled with diet -Last A1c 5.7% on 12/01/2019   Antimicrobials: none  DVT prophylaxis: Lovenox Code Status: Full Family Communication: none present Disposition Plan: Status is: Inpatient  Remains inpatient appropriate because:Unsafe d/c plan, IV treatments appropriate due to intensity of illness or inability to take PO, Inpatient level of care appropriate due to severity of illness and surgery on 10/2   Dispo: The patient is from: Home              Anticipated d/c is to: pending PT eval after surgery              Anticipated d/c date is: 3 days              Patient currently is not medically stable to d/c.  Objective: Blood pressure 122/70, pulse 83, temperature 98.2 F (36.8 C), temperature source Oral, resp. rate 18, weight 71.7 kg, SpO2 98 %.  Examination: General appearance: alert, cooperative and no distress Head: Normocephalic, without obvious abnormality, atraumatic Eyes: EOMI Lungs: clear to auscultation bilaterally Heart: regular rate and rhythm and S1, S2 normal Abdomen: soft, NT, ND, BS present Extremities: chronic LE edema at baseline Skin: mobility and turgor normal Neurologic: pain limiting strength in LLE  however still has adequate strength on assessment, no paresthesias, otherwise no focal deficits   Consultants:   Cardiology  Ortho  Procedures:   Tentative surgery 10/2  Data Reviewed: I have personally reviewed following labs and imaging studies Results for orders placed or performed during the hospital encounter of 12/30/19 (from the past 24 hour(s))  CBC  with Differential     Status: Abnormal   Collection Time: 12/30/19  2:29 PM  Result Value Ref Range   WBC 5.3 4.0 - 10.5 K/uL   RBC 3.31 (L) 3.87 - 5.11 MIL/uL   Hemoglobin 10.2 (L) 12.0 - 15.0 g/dL   HCT 31.4 (L) 36 - 46 %   MCV 94.9 80.0 - 100.0 fL   MCH 30.8 26.0 - 34.0 pg   MCHC 32.5 30.0 - 36.0 g/dL   RDW 13.4 11.5 - 15.5 %   Platelets 178 150 - 400 K/uL   nRBC 0.0 0.0 - 0.2 %   Neutrophils Relative % 68 %   Neutro Abs 3.6 1.7 - 7.7 K/uL   Lymphocytes Relative 15 %   Lymphs Abs 0.8 0.7 - 4.0 K/uL   Monocytes Relative 13 %   Monocytes Absolute 0.7 0 - 1 K/uL   Eosinophils Relative 4 %   Eosinophils Absolute 0.2 0 - 0 K/uL   Basophils Relative 0 %   Basophils Absolute 0.0 0 - 0 K/uL   Immature Granulocytes 0 %   Abs Immature Granulocytes 0.02 0.00 - 0.07 K/uL  Basic metabolic panel     Status: Abnormal   Collection Time: 12/30/19  2:29 PM  Result Value Ref Range   Sodium 141 135 - 145 mmol/L   Potassium 4.3 3.5 - 5.1 mmol/L   Chloride 109 98 - 111 mmol/L   CO2 21 (L) 22 - 32 mmol/L   Glucose, Bld 99 70 - 99 mg/dL   BUN 37 (H) 8 - 23 mg/dL   Creatinine, Ser 1.29 (H) 0.44 - 1.00 mg/dL   Calcium 9.0 8.9 - 10.3 mg/dL   GFR calc non Af Amer 35 (L) >60 mL/min   GFR calc Af Amer 41 (L) >60 mL/min   Anion gap 11 5 - 15  Respiratory Panel by RT PCR (Flu A&B, Covid) - Nasopharyngeal Swab     Status: None   Collection Time: 12/30/19  2:29 PM   Specimen: Nasopharyngeal Swab  Result Value Ref Range   SARS Coronavirus 2 by RT PCR NEGATIVE NEGATIVE   Influenza A by PCR NEGATIVE NEGATIVE   Influenza B by PCR NEGATIVE NEGATIVE  CBC     Status: Abnormal   Collection Time: 12/31/19  5:54 AM  Result Value Ref Range   WBC 6.5 4.0 - 10.5 K/uL   RBC 2.94 (L) 3.87 - 5.11 MIL/uL   Hemoglobin 9.3 (L) 12.0 - 15.0 g/dL   HCT 28.5 (L) 36 - 46 %   MCV 96.9 80.0 - 100.0 fL   MCH 31.6 26.0 - 34.0 pg   MCHC 32.6 30.0 - 36.0 g/dL   RDW 13.5 11.5 - 15.5 %   Platelets 176 150 - 400 K/uL    nRBC 0.0 0.0 - 0.2 %  Basic metabolic panel     Status: Abnormal   Collection Time: 12/31/19  5:54 AM  Result Value Ref Range   Sodium 140 135 - 145 mmol/L   Potassium 5.2 (H) 3.5 - 5.1 mmol/L   Chloride 106 98 - 111 mmol/L   CO2 24 22 - 32 mmol/L  Glucose, Bld 135 (H) 70 - 99 mg/dL   BUN 37 (H) 8 - 23 mg/dL   Creatinine, Ser 1.45 (H) 0.44 - 1.00 mg/dL   Calcium 8.9 8.9 - 10.3 mg/dL   GFR calc non Af Amer 31 (L) >60 mL/min   GFR calc Af Amer 36 (L) >60 mL/min   Anion gap 10 5 - 15  Brain natriuretic peptide     Status: Abnormal   Collection Time: 12/31/19  5:54 AM  Result Value Ref Range   B Natriuretic Peptide 368.2 (H) 0.0 - 100.0 pg/mL    Recent Results (from the past 240 hour(s))  Respiratory Panel by RT PCR (Flu A&B, Covid) - Nasopharyngeal Swab     Status: None   Collection Time: 12/30/19  2:29 PM   Specimen: Nasopharyngeal Swab  Result Value Ref Range Status   SARS Coronavirus 2 by RT PCR NEGATIVE NEGATIVE Final    Comment: (NOTE) SARS-CoV-2 target nucleic acids are NOT DETECTED.  The SARS-CoV-2 RNA is generally detectable in upper respiratoy specimens during the acute phase of infection. The lowest concentration of SARS-CoV-2 viral copies this assay can detect is 131 copies/mL. A negative result does not preclude SARS-Cov-2 infection and should not be used as the sole basis for treatment or other patient management decisions. A negative result may occur with  improper specimen collection/handling, submission of specimen other than nasopharyngeal swab, presence of viral mutation(s) within the areas targeted by this assay, and inadequate number of viral copies (<131 copies/mL). A negative result must be combined with clinical observations, patient history, and epidemiological information. The expected result is Negative.  Fact Sheet for Patients:  PinkCheek.be  Fact Sheet for Healthcare Providers:    GravelBags.it  This test is no t yet approved or cleared by the [place] FDA and  has been authorized for detection and/or diagnosis of SARS-CoV-2 by FDA under an Emergency Use Authorization (EUA). This EUA will remain  in effect (meaning this test can be used) for the duration of the COVID-19 declaration under Section 564(b)(1) of the Act, 21 U.S.C. section 360bbb-3(b)(1), unless the authorization is terminated or revoked sooner.     Influenza A by PCR NEGATIVE NEGATIVE Final   Influenza B by PCR NEGATIVE NEGATIVE Final    Comment: (NOTE) The Xpert Xpress SARS-CoV-2/FLU/RSV assay is intended as an aid in  the diagnosis of influenza from Nasopharyngeal swab specimens and  should not be used as a sole basis for treatment. Nasal washings and  aspirates are unacceptable for Xpert Xpress SARS-CoV-2/FLU/RSV  testing.  Fact Sheet for Patients: PinkCheek.be  Fact Sheet for Healthcare Providers: GravelBags.it  This test is not yet approved or cleared by the [place] FDA and  has been authorized for detection and/or diagnosis of SARS-CoV-2 by  FDA under an Emergency Use Authorization (EUA). This EUA will remain  in effect (meaning this test can be used) for the duration of the  Covid-19 declaration under Section 564(b)(1) of the Act, 21  U.S.C. section 360bbb-3(b)(1), unless the authorization is  terminated or revoked. Performed at [facility], [place] [address] [place], [place] [address]      Radiology Studies: DG Pelvis 1-2 Views  Result Date: 12/30/2019 CLINICAL DATA:  Pt c/o left groin pain s/p fall today while carrying trash to dumpster. EXAM: PELVIS - 1-2 VIEW; DG HIP (WITH OR WITHOUT PELVIS) 2-3V LEFT; LEFT FEMUR 2 VIEWS COMPARISON:  X-ray pelvis 07/31/2013. FINDINGS: Acute, comminuted, left femoral intertrochanteric fracture. No left hip  dislocation. No acute  fracture or dislocation of the left femur distally. The left knee is visualized on the AP view and demonstrates severe medial tibiofemoral compartment degenerative changes with marked joint space narrowing and osteophyte marginal formation. There is no evidence of pelvic fracture or diastasis. No pelvic bone lesions are seen. Visualized portions of the lower lumbar spine demonstrates degenerative changes. Partially visualized sacrum is grossly unremarkable with limited evaluation due to overlying bowel. Right hip arthroplasty. The surgical hardware femoral head component appears to be in alignment with the acetabulum. No evidence of proximal right femoral fracture. Vascular calcifications. Similar-appearing peripherally calcified calcification overlying the left pelvis. Right inguinal region surgical clips. Round calcification overlying the region of the left gluteus soft tissues likely represents an injection granuloma. IMPRESSION: 1. Acute comminuted left femoral intertrochanteric fracture. No left hip dislocation. 2. Right hip arthroplasty with no radiographic evidence of surgical hardware complication. 3. No acute displaced fracture or diastasis of the bones of the pelvis. 4. Severe left knee degenerative changes with no findings to suggest acute displaced fracture or dislocation of the bones of the distal left femur. Please note limited evaluation of the left knee with only a single AP view. Electronically Signed   By: Iven Finn M.D.   On: 12/30/2019 15:38   DG Chest Portable 1 View  Result Date: 12/30/2019 CLINICAL DATA:  Fall EXAM: PORTABLE CHEST 1 VIEW COMPARISON:  Chest x-ray 03/23/2018. FINDINGS: The heart size and mediastinal contours are unchanged. Aortic valve replacement. Mitral annular calcifications again noted. Aortic arch calcifications. No focal consolidation. No pulmonary edema. No pleural effusion. No pneumothorax. Multilevel degenerative changes of the visualized spine. Degenerative  changes of bilateral shoulders. No acute displaced fracture. IMPRESSION: No active cardiopulmonary disease. Electronically Signed   By: Iven Finn M.D.   On: 12/30/2019 15:41   DG Hip Unilat W or Wo Pelvis 2-3 Views Left  Result Date: 12/30/2019 CLINICAL DATA:  Pt c/o left groin pain s/p fall today while carrying trash to dumpster. EXAM: PELVIS - 1-2 VIEW; DG HIP (WITH OR WITHOUT PELVIS) 2-3V LEFT; LEFT FEMUR 2 VIEWS COMPARISON:  X-ray pelvis 07/31/2013. FINDINGS: Acute, comminuted, left femoral intertrochanteric fracture. No left hip dislocation. No acute fracture or dislocation of the left femur distally. The left knee is visualized on the AP view and demonstrates severe medial tibiofemoral compartment degenerative changes with marked joint space narrowing and osteophyte marginal formation. There is no evidence of pelvic fracture or diastasis. No pelvic bone lesions are seen. Visualized portions of the lower lumbar spine demonstrates degenerative changes. Partially visualized sacrum is grossly unremarkable with limited evaluation due to overlying bowel. Right hip arthroplasty. The surgical hardware femoral head component appears to be in alignment with the acetabulum. No evidence of proximal right femoral fracture. Vascular calcifications. Similar-appearing peripherally calcified calcification overlying the left pelvis. Right inguinal region surgical clips. Round calcification overlying the region of the left gluteus soft tissues likely represents an injection granuloma. IMPRESSION: 1. Acute comminuted left femoral intertrochanteric fracture. No left hip dislocation. 2. Right hip arthroplasty with no radiographic evidence of surgical hardware complication. 3. No acute displaced fracture or diastasis of the bones of the pelvis. 4. Severe left knee degenerative changes with no findings to suggest acute displaced fracture or dislocation of the bones of the distal left femur. Please note limited evaluation of  the left knee with only a single AP view. Electronically Signed   By: Iven Finn M.D.   On: 12/30/2019 15:38   DG Femur  Min 2 Views Left  Result Date: 12/30/2019 CLINICAL DATA:  Pt c/o left groin pain s/p fall today while carrying trash to dumpster. EXAM: PELVIS - 1-2 VIEW; DG HIP (WITH OR WITHOUT PELVIS) 2-3V LEFT; LEFT FEMUR 2 VIEWS COMPARISON:  X-ray pelvis 07/31/2013. FINDINGS: Acute, comminuted, left femoral intertrochanteric fracture. No left hip dislocation. No acute fracture or dislocation of the left femur distally. The left knee is visualized on the AP view and demonstrates severe medial tibiofemoral compartment degenerative changes with marked joint space narrowing and osteophyte marginal formation. There is no evidence of pelvic fracture or diastasis. No pelvic bone lesions are seen. Visualized portions of the lower lumbar spine demonstrates degenerative changes. Partially visualized sacrum is grossly unremarkable with limited evaluation due to overlying bowel. Right hip arthroplasty. The surgical hardware femoral head component appears to be in alignment with the acetabulum. No evidence of proximal right femoral fracture. Vascular calcifications. Similar-appearing peripherally calcified calcification overlying the left pelvis. Right inguinal region surgical clips. Round calcification overlying the region of the left gluteus soft tissues likely represents an injection granuloma. IMPRESSION: 1. Acute comminuted left femoral intertrochanteric fracture. No left hip dislocation. 2. Right hip arthroplasty with no radiographic evidence of surgical hardware complication. 3. No acute displaced fracture or diastasis of the bones of the pelvis. 4. Severe left knee degenerative changes with no findings to suggest acute displaced fracture or dislocation of the bones of the distal left femur. Please note limited evaluation of the left knee with only a single AP view. Electronically Signed   By: Iven Finn M.D.   On: 12/30/2019 15:38   DG Pelvis 1-2 Views  Final Result    DG Femur Min 2 Views Left  Final Result    DG Chest Portable 1 View  Final Result    DG Hip Unilat W or Wo Pelvis 2-3 Views Left  Final Result      Scheduled Meds: . amLODipine  7.5 mg Oral Daily  . calcium-vitamin D  1 tablet Oral Q breakfast  . carvedilol  6.25 mg Oral BID WC  . enoxaparin (LOVENOX) injection  30 mg Subcutaneous Once  . ezetimibe  10 mg Oral Daily  . [START ON 01/01/2020] feeding supplement (ENSURE ENLIVE)  237 mL Oral BID BM  . isosorbide mononitrate  60 mg Oral Daily  . latanoprost  1 drop Both Eyes QHS  . losartan  25 mg Oral Daily  . multivitamin with minerals  1 tablet Oral Daily   PRN Meds: acetaminophen, HYDROcodone-acetaminophen, HYDROmorphone (DILAUDID) injection, ondansetron (ZOFRAN) IV, prochlorperazine Continuous Infusions:    LOS: 1 day  Time spent: Greater than 50% of the 35 minute visit was spent in counseling/coordination of care for the patient as laid out in the A&P.   Dwyane Dee, MD [facility] 12/31/2019, 1:23 PM  Contact via secure chat.  To contact the attending provider between 7A-7P or the covering provider during after hours 7P-7A, please log into the web site [email or web address] and access using universal [place] password for that web site. If you do not have the password, please call the hospital operator.

## 2019-12-31 NOTE — Hospital Course (Addendum)
Sara Garza is a 86 y.o. female with medical history significant of chronic diastolic CHF, aortic stenosis status post [name] in 2014, hypertension, CKD stage III who presented with mechanical fall.   Patient tripped and fell on her left hip while walking to a dumpster.  She denied any prodromes of lightheaded or blurred vision before she fell down and denied any chest pain or shortness of breath.     She follows with cardiology for her CHF and [name], most recent echo in July 2021 showed EF 65 to 70% with asymmetrical wall thickening and diastolic dysfunction.   In the ER she underwent multiple xrays which showed "Acute comminuted left femoral ntertrochanteric fracture. No left hip dislocation."  She was evaluated by orthopedic surgery with tentative plans for repair on 01/01/2020. Cardiology was also consulted for further cardiac clearance prior to surgery.  She underwent IM nail fixation of left hip fx on 01/01/2020 and tolerated well.  PT evaluated patient on 10/3 and recommends SNF for ongoing rehab.   Her Hgb downtrended some after surgery to 7.1g/dL and on 10/4 she underwent 1 unit PRBC transfusion. Hgb improved to 9.7 g/dL the following morning.  She accepted a bed offer at [facility] and was discharged in stable condition.  Regarding ongoing DVT ppx she will be on asa 81 mg BID x 6 weeks and will need to follow up with ortho in 2 weeks after discharge.

## 2019-12-31 NOTE — Plan of Care (Signed)
  Problem: Education: Goal: Verbalization of understanding the information provided (i.e., activity precautions, restrictions, etc) will improve Outcome: Progressing   Problem: Activity: Goal: Ability to ambulate and perform ADLs will improve Outcome: Progressing   Problem: Clinical Measurements: Goal: Postoperative complications will be avoided or minimized Outcome: Progressing   

## 2019-12-31 NOTE — Consult Note (Signed)
ORTHOPAEDIC CONSULTATION  REQUESTING PHYSICIAN: Dwyane Dee, MD  PCP:  [name], NP  Chief Complaint: Left hip pain  HPI:   Sara Garza is a 86 y.o. female complaining of left hip pain with medical history significant of chronic diastolic CHF, aortic stenosis status post [name] in 2014, hypertension, CKD stage III, presented with mechanical fall.  Patient tripped and fell on her left hip while walking to a dumpster on Thursday.  She denied any prodromes of lightheaded or blurred vision before she fell down and denied any chest pain or shortness of breath  Past Medical History:  Diagnosis Date  . Arthritis   . CAD (coronary artery disease)   . Carotid stenosis, left    50-60% stable  . Diabetes mellitus   . Glaucoma   . Hyperlipidemia   . Hypertension   . Leg pain    ABIs 2/18: normal bilaterally.  . Osteoporosis   . Valvular heart disease    Aortic Stenosis s/p [name] 2014 // Mitral stenosis // Echo 09/2018: EF > 65, mod LVH, severe focal basal hypertrophy, RVSP 39.8, mild to mod MR, mod to severe MS (mean 9), AVR with mild AI, severe LAE (similar to prior echo)   Past Surgical History:  Procedure Laterality Date  . ANOMALOUS PULMONARY VENOUS RETURN REPAIR, TOTAL    . APPENDECTOMY    . CARDIAC VALVE REPLACEMENT  04/29/2012  . Cataracts Bilateral   . CESAREAN SECTION    . FOOT SURGERY     [name]  . HIP ARTHROPLASTY Right 07/31/2013   Procedure: ARTHROPLASTY BIPOLAR HIP;  Surgeon: [name], MD;  Location: [facility];  Service: Orthopedics;  Laterality: Right;  . PERCUTANEOUS CORONARY STENT INTERVENTION (PCI-S) N/A 01/02/2012   Procedure: PERCUTANEOUS CORONARY STENT INTERVENTION (PCI-S);  Surgeon: Sherren Mocha, MD;  Location: [facility] CATH LAB;  Service: Cardiovascular;  Laterality: N/A;   Social History   Socioeconomic History  . Marital status: Widowed    Spouse name: Not on file  . Number of children: Not on file  . Years of education: Not on file  . Highest  education level: Not on file  Occupational History  . Not on file  Tobacco Use  . Smoking status: Passive Smoke Exposure - Never Smoker  . Smokeless tobacco: Never Used  Substance and Sexual Activity  . Alcohol use: Yes    Alcohol/week: 0.0 standard drinks    Comment: very seldom  . Drug use: No  . Sexual activity: Not Currently  Other Topics Concern  . Not on file  Social History Narrative   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      She likes to read and she takes classes at [employer] Strain: High Risk  . Difficulty of Paying Living Expenses: Hard  Food Insecurity:   . Worried About Charity fundraiser in the Last Year: Not on file  . Ran Out of Food in the Last Year: Not on file  Transportation Needs: No Transportation Needs  . Lack of Transportation (Medical): No  . Lack of Transportation (Non-Medical): No  Physical Activity:   . Days of Exercise per Week: Not on file  . Minutes of Exercise per Session: Not on file  Stress:   . Feeling of Stress : Not on file  Social Connections:   . Frequency of Communication with Friends and  Family: Not on file  . Frequency of Social Gatherings with Friends and Family: Not on file  . Attends Religious Services: Not on file  . Active Member of Clubs or Organizations: Not on file  . Attends Archivist Meetings: Not on file  . Marital Status: Not on file   Family History  Problem Relation Age of Onset  . COPD Father   . Cancer Father        lung cancer  . Lung cancer Other    Allergies  Allergen Reactions  . Statins Other (See Comments)    Muscle aches  . Gabapentin Other (See Comments)    SOMNOLENCE   Prior to Admission medications   Medication Sig Start Date End Date Taking? Authorizing Provider  acetaminophen (TYLENOL) 500 MG tablet Take 1,000 mg by mouth in the morning and at bedtime.   Yes [provider]    amLODipine (NORVASC) 5 MG tablet TAKE 1 AND 1/2 TABLETS(7.5 MG) BY MOUTH DAILY Patient taking differently: Take 7.5 mg by mouth daily.  09/28/19  Yes Nafziger, [name], NP  amoxicillin (AMOXIL) 500 MG capsule TK 4 CAPSULES PO 1 HOUR PRIOR TO DENTAL PROCEDURES 02/02/19  Yes Sherren Mocha, MD  aspirin EC 81 MG tablet Take 1 tablet (81 mg total) by mouth daily. Start after lovenox injections are done after 2 weeks 08/03/13  Yes Rai, Ripudeep K, MD  bimatoprost (LUMIGAN) 0.01 % SOLN Place 1 drop into both eyes at bedtime.    Yes [provider]  Calcium Carb-Cholecalciferol (CALCIUM 500 +D) 500-400 MG-UNIT TABS Take 1 tablet by mouth daily.    Yes [provider]  carvedilol (COREG) 3.125 MG tablet TAKE 1 TABLET(3.125 MG) BY MOUTH TWICE DAILY WITH A MEAL 09/16/19  Yes Nafziger, [name], NP  ezetimibe (ZETIA) 10 MG tablet TAKE 1 TABLET(10 MG) BY MOUTH DAILY Patient taking differently: Take 10 mg by mouth daily.  11/03/19  Yes Sherren Mocha, MD  furosemide (LASIX) 20 MG tablet Take 1 tablet (20 mg total) by mouth daily. Patient taking differently: Take 10-20 mg by mouth daily as needed for fluid.  04/19/19 04/13/20 Yes Sherren Mocha, MD  isosorbide mononitrate (IMDUR) 60 MG 24 hr tablet TAKE 1 TABLET(60 MG) BY MOUTH DAILY Patient taking differently: Take 60 mg by mouth daily.  04/05/19  Yes Weaver, Scott T, PA-C  losartan (COZAAR) 25 MG tablet TAKE 1 TABLET(25 MG) BY MOUTH DAILY Patient taking differently: Take 25 mg by mouth daily.  08/25/19  Yes Nafziger, [name], NP  Multiple Vitamin (MULTIVITAMIN) tablet Take 1 tablet by mouth daily.     Yes [provider]  Multiple Vitamins-Minerals (PRESERVISION AREDS 2+MULTI VIT PO) Take 2 capsules by mouth in the morning and at bedtime.   Yes [provider]  oxybutynin (DITROPAN-XL) 5 MG 24 hr tablet TAKE 1 TABLET(5 MG) BY MOUTH AT BEDTIME Patient taking differently: Take 5 mg by mouth at bedtime.  12/02/19   [name], NP   DG  Pelvis 1-2 Views  Result Date: 12/30/2019 CLINICAL DATA:  Pt c/o left groin pain s/p fall today while carrying trash to dumpster. EXAM: PELVIS - 1-2 VIEW; DG HIP (WITH OR WITHOUT PELVIS) 2-3V LEFT; LEFT FEMUR 2 VIEWS COMPARISON:  X-ray pelvis 07/31/2013. FINDINGS: Acute, comminuted, left femoral intertrochanteric fracture. No left hip dislocation. No acute fracture or dislocation of the left femur distally. The left knee is visualized on the AP view and demonstrates severe medial tibiofemoral compartment degenerative changes with marked joint space  narrowing and osteophyte marginal formation. There is no evidence of pelvic fracture or diastasis. No pelvic bone lesions are seen. Visualized portions of the lower lumbar spine demonstrates degenerative changes. Partially visualized sacrum is grossly unremarkable with limited evaluation due to overlying bowel. Right hip arthroplasty. The surgical hardware femoral head component appears to be in alignment with the acetabulum. No evidence of proximal right femoral fracture. Vascular calcifications. Similar-appearing peripherally calcified calcification overlying the left pelvis. Right inguinal region surgical clips. Round calcification overlying the region of the left gluteus soft tissues likely represents an injection granuloma. IMPRESSION: 1. Acute comminuted left femoral intertrochanteric fracture. No left hip dislocation. 2. Right hip arthroplasty with no radiographic evidence of surgical hardware complication. 3. No acute displaced fracture or diastasis of the bones of the pelvis. 4. Severe left knee degenerative changes with no findings to suggest acute displaced fracture or dislocation of the bones of the distal left femur. Please note limited evaluation of the left knee with only a single AP view. Electronically Signed   By: Iven Finn M.D.   On: 12/30/2019 15:38   DG Chest Portable 1 View  Result Date: 12/30/2019 CLINICAL DATA:  Fall EXAM: PORTABLE  CHEST 1 VIEW COMPARISON:  Chest x-ray 03/23/2018. FINDINGS: The heart size and mediastinal contours are unchanged. Aortic valve replacement. Mitral annular calcifications again noted. Aortic arch calcifications. No focal consolidation. No pulmonary edema. No pleural effusion. No pneumothorax. Multilevel degenerative changes of the visualized spine. Degenerative changes of bilateral shoulders. No acute displaced fracture. IMPRESSION: No active cardiopulmonary disease. Electronically Signed   By: Iven Finn M.D.   On: 12/30/2019 15:41   DG Hip Unilat W or Wo Pelvis 2-3 Views Left  Result Date: 12/30/2019 CLINICAL DATA:  Pt c/o left groin pain s/p fall today while carrying trash to dumpster. EXAM: PELVIS - 1-2 VIEW; DG HIP (WITH OR WITHOUT PELVIS) 2-3V LEFT; LEFT FEMUR 2 VIEWS COMPARISON:  X-ray pelvis 07/31/2013. FINDINGS: Acute, comminuted, left femoral intertrochanteric fracture. No left hip dislocation. No acute fracture or dislocation of the left femur distally. The left knee is visualized on the AP view and demonstrates severe medial tibiofemoral compartment degenerative changes with marked joint space narrowing and osteophyte marginal formation. There is no evidence of pelvic fracture or diastasis. No pelvic bone lesions are seen. Visualized portions of the lower lumbar spine demonstrates degenerative changes. Partially visualized sacrum is grossly unremarkable with limited evaluation due to overlying bowel. Right hip arthroplasty. The surgical hardware femoral head component appears to be in alignment with the acetabulum. No evidence of proximal right femoral fracture. Vascular calcifications. Similar-appearing peripherally calcified calcification overlying the left pelvis. Right inguinal region surgical clips. Round calcification overlying the region of the left gluteus soft tissues likely represents an injection granuloma. IMPRESSION: 1. Acute comminuted left femoral intertrochanteric fracture. No  left hip dislocation. 2. Right hip arthroplasty with no radiographic evidence of surgical hardware complication. 3. No acute displaced fracture or diastasis of the bones of the pelvis. 4. Severe left knee degenerative changes with no findings to suggest acute displaced fracture or dislocation of the bones of the distal left femur. Please note limited evaluation of the left knee with only a single AP view. Electronically Signed   By: Iven Finn M.D.   On: 12/30/2019 15:38   DG Femur Min 2 Views Left  Result Date: 12/30/2019 CLINICAL DATA:  Pt c/o left groin pain s/p fall today while carrying trash to dumpster. EXAM: PELVIS - 1-2 VIEW; DG HIP (WITH OR  WITHOUT PELVIS) 2-3V LEFT; LEFT FEMUR 2 VIEWS COMPARISON:  X-ray pelvis 07/31/2013. FINDINGS: Acute, comminuted, left femoral intertrochanteric fracture. No left hip dislocation. No acute fracture or dislocation of the left femur distally. The left knee is visualized on the AP view and demonstrates severe medial tibiofemoral compartment degenerative changes with marked joint space narrowing and osteophyte marginal formation. There is no evidence of pelvic fracture or diastasis. No pelvic bone lesions are seen. Visualized portions of the lower lumbar spine demonstrates degenerative changes. Partially visualized sacrum is grossly unremarkable with limited evaluation due to overlying bowel. Right hip arthroplasty. The surgical hardware femoral head component appears to be in alignment with the acetabulum. No evidence of proximal right femoral fracture. Vascular calcifications. Similar-appearing peripherally calcified calcification overlying the left pelvis. Right inguinal region surgical clips. Round calcification overlying the region of the left gluteus soft tissues likely represents an injection granuloma. IMPRESSION: 1. Acute comminuted left femoral intertrochanteric fracture. No left hip dislocation. 2. Right hip arthroplasty with no radiographic evidence of  surgical hardware complication. 3. No acute displaced fracture or diastasis of the bones of the pelvis. 4. Severe left knee degenerative changes with no findings to suggest acute displaced fracture or dislocation of the bones of the distal left femur. Please note limited evaluation of the left knee with only a single AP view. Electronically Signed   By: Iven Finn M.D.   On: 12/30/2019 15:38    Positive ROS: All other systems have been reviewed and were otherwise negative with the exception of those mentioned in the HPI and as above.  Physical Exam: General: Alert, no acute distress Cardiovascular: No pedal edema Respiratory: No cyanosis, no use of accessory musculature GI: No organomegaly, abdomen is soft and non-tender Skin: No lesions in the area of chief complaint Neurologic: Sensation intact distally Psychiatric: Patient is competent for consent with normal mood and affect Lymphatic: No axillary or cervical lymphadenopathy  MUSCULOSKELETAL: Inspection shows no skin wounds or lesions. Left leg shortened and rotated. Dorsiflexion and plantar flexion intact. Pedal pulse present  Assessment: Closed fracture of femur, intertrochanteric, left   Plan: Left hip fracture: Surgery Saturday AM with Dr. Lyla Glassing.  NPO after midnight tonight CHF, CKD, aortic stenosis s/p [name], HTN:  Treat per hospitalist    [name], PA [phone]    12/31/2019 1:22 PM

## 2019-12-31 NOTE — Assessment & Plan Note (Signed)
-   continue current regimen; BP stable - parameters placed on meds in case of hypoTN

## 2019-12-31 NOTE — Progress Notes (Signed)
Initial Nutrition Assessment  DOCUMENTATION CODES:   Obesity unspecified  INTERVENTION:   -Ensure Enlive po BID, each supplement provides 350 kcal and 20 grams of protein  NUTRITION DIAGNOSIS:   Increased nutrient needs related to hip fracture, post-op healing as evidenced by estimated needs.  GOAL:   Patient will meet greater than or equal to 90% of their needs  MONITOR:   PO intake, Supplement acceptance, Labs, Weight trends, I & O's  REASON FOR ASSESSMENT:   Consult Hip fracture protocol  ASSESSMENT:   86 y.o. female with medical history significant of chronic diastolic CHF, aortic stenosis status post [name] in 2014, hypertension, CKD stage III, presented with mechanical fall.  Patient in room with no visitors at bedside. Pt reports feeling very nauseous this morning. Tried to eat some toast and jello but has started dry heaving. RD provided saltines and ginger ale for pt to sip on. Noted that MD has now ordered pt some nausea medication as well.   Spoke with pt and pt's daughter(on the phone) about increasing protein following her surgery tomorrow. Will order Ensure at that time and hopefully nausea will be controlled. Pt's daughter reports some sensitivity to pain medications in the past.  Per weight records, weight has remained stable.  Medications: OSCAL with D, Multivitamin with minerals daily Labs reviewed: Elevated K  NUTRITION - FOCUSED PHYSICAL EXAM:    Most Recent Value  Orbital Region Mild depletion  Upper Arm Region Mild depletion  Thoracic and Lumbar Region Unable to assess  Buccal Region No depletion  Temple Region Mild depletion  Clavicle Bone Region Mild depletion  Clavicle and Acromion Bone Region Mild depletion  Scapular Bone Region No depletion  Dorsal Hand Mild depletion  Patellar Region Unable to assess  Anterior Thigh Region Unable to assess  Posterior Calf Region Unable to assess  Edema (RD Assessment) None       Diet Order:    Diet Order            Diet Heart Room service appropriate? Yes; Fluid consistency: Thin; Fluid restriction: 2000 mL Fluid  Diet effective now                 EDUCATION NEEDS:   No education needs have been identified at this time  Skin:  Skin Assessment: Reviewed RN Assessment  Last BM:  9/29  Height:   Ht Readings from Last 1 Encounters:  12/01/19 4\' 9"  (1.448 m)    Weight:   Wt Readings from Last 1 Encounters:  12/30/19 71.7 kg   BMI:  Body mass index is 34.19 kg/m.  Estimated Nutritional Needs:   Kcal:  1350-1550  Protein:  65-75g  Fluid:  1.6L/day  Clayton Bibles, MS, RD, LDN Inpatient Clinical Dietitian Contact information available via Amion

## 2019-12-31 NOTE — Assessment & Plan Note (Signed)
-  Well-controlled with diet -Last A1c 5.7% on 12/01/2019

## 2020-01-01 ENCOUNTER — Other Ambulatory Visit: Payer: Self-pay | Admitting: Adult Health

## 2020-01-01 ENCOUNTER — Inpatient Hospital Stay (HOSPITAL_COMMUNITY): Payer: Medicare Other

## 2020-01-01 ENCOUNTER — Inpatient Hospital Stay (HOSPITAL_COMMUNITY): Payer: Medicare Other | Admitting: Certified Registered Nurse Anesthetist

## 2020-01-01 ENCOUNTER — Encounter (HOSPITAL_COMMUNITY)
Admission: EM | Disposition: A | Discharge status: Skilled Nursing Facility | Payer: Self-pay | Source: Home / Self Care | Attending: Internal Medicine

## 2020-01-01 ENCOUNTER — Encounter (HOSPITAL_COMMUNITY): Payer: Self-pay | Admitting: Internal Medicine

## 2020-01-01 DIAGNOSIS — H5789 Other specified disorders of eye and adnexa: Secondary | ICD-10-CM

## 2020-01-01 HISTORY — PX: INTRAMEDULLARY (IM) NAIL INTERTROCHANTERIC: SHX5875

## 2020-01-01 LAB — BASIC METABOLIC PANEL
Anion gap: 9 (ref 5–15)
BUN: 41 mg/dL — ABNORMAL HIGH (ref 8–23)
CO2: 24 mmol/L (ref 22–32)
Calcium: 9.4 mg/dL (ref 8.9–10.3)
Chloride: 104 mmol/L (ref 98–111)
Creatinine, Ser: 1.56 mg/dL — ABNORMAL HIGH (ref 0.44–1.00)
GFR calc Af Amer: 33 mL/min — ABNORMAL LOW (ref >60)
GFR calc non Af Amer: 28 mL/min — ABNORMAL LOW (ref >60)
Glucose, Bld: 130 mg/dL — ABNORMAL HIGH (ref 70–99)
Potassium: 5.1 mmol/L (ref 3.5–5.1)
Sodium: 137 mmol/L (ref 135–145)

## 2020-01-01 LAB — CBC WITH DIFFERENTIAL/PLATELET
Abs Immature Granulocytes: 0.05 10*3/uL (ref 0.00–0.07)
Basophils Absolute: 0 10*3/uL (ref 0.0–0.1)
Basophils Relative: 0 %
Eosinophils Absolute: 0.1 10*3/uL (ref 0.0–0.5)
Eosinophils Relative: 1 %
HCT: 28.4 % — ABNORMAL LOW (ref 36.0–46.0)
Hemoglobin: 9 g/dL — ABNORMAL LOW (ref 12.0–15.0)
Immature Granulocytes: 1 %
Lymphocytes Relative: 10 %
Lymphs Abs: 0.8 10*3/uL (ref 0.7–4.0)
MCH: 31.4 pg (ref 26.0–34.0)
MCHC: 31.7 g/dL (ref 30.0–36.0)
MCV: 99 fL (ref 80.0–100.0)
Monocytes Absolute: 0.7 10*3/uL (ref 0.1–1.0)
Monocytes Relative: 10 %
Neutro Abs: 5.9 10*3/uL (ref 1.7–7.7)
Neutrophils Relative %: 78 %
Platelets: 192 10*3/uL (ref 150–400)
RBC: 2.87 MIL/uL — ABNORMAL LOW (ref 3.87–5.11)
RDW: 13.6 % (ref 11.5–15.5)
WBC: 7.5 10*3/uL (ref 4.0–10.5)
nRBC: 0 % (ref 0.0–0.2)

## 2020-01-01 LAB — SURGICAL PCR SCREEN
MRSA, PCR: NEGATIVE
Staphylococcus aureus: NEGATIVE

## 2020-01-01 LAB — GLUCOSE, CAPILLARY: Glucose-Capillary: 122 mg/dL — ABNORMAL HIGH (ref 70–99)

## 2020-01-01 LAB — MAGNESIUM: Magnesium: 2.6 mg/dL — ABNORMAL HIGH (ref 1.7–2.4)

## 2020-01-01 SURGERY — FIXATION, FRACTURE, INTERTROCHANTERIC, WITH INTRAMEDULLARY ROD
Anesthesia: Monitor Anesthesia Care | Site: Hip | Laterality: Left

## 2020-01-01 MED ORDER — FENTANYL CITRATE (PF) 100 MCG/2ML IJ SOLN
INTRAMUSCULAR | Status: AC
Start: 2020-01-01 — End: 2020-01-01
  Filled 2020-01-01: qty 2

## 2020-01-01 MED ORDER — ENOXAPARIN SODIUM 30 MG/0.3ML ~~LOC~~ SOLN
30.0000 mg | SUBCUTANEOUS | Status: DC
  Administered 2020-01-02 – 2020-01-04 (×3): 30 mg via SUBCUTANEOUS
  Filled 2020-01-01 (×3): qty 0.3

## 2020-01-01 MED ORDER — SENNA 8.6 MG PO TABS
1.0000 | ORAL_TABLET | Freq: Two times a day (BID) | ORAL | Status: DC
  Administered 2020-01-01 – 2020-01-03 (×4): 8.6 mg via ORAL
  Filled 2020-01-01 (×4): qty 1

## 2020-01-01 MED ORDER — METHOCARBAMOL 500 MG IVPB - SIMPLE MED
500.0000 mg | Freq: Four times a day (QID) | INTRAVENOUS | Status: DC | PRN
  Filled 2020-01-01: qty 50

## 2020-01-01 MED ORDER — PROPOFOL 500 MG/50ML IV EMUL
INTRAVENOUS | Status: DC | PRN
  Administered 2020-01-01: 40 mg via INTRAVENOUS

## 2020-01-01 MED ORDER — PROPOFOL 10 MG/ML IV BOLUS
INTRAVENOUS | Status: AC
  Filled 2020-01-01: qty 20

## 2020-01-01 MED ORDER — ONDANSETRON HCL 4 MG/2ML IJ SOLN: 4.0000 mg | Freq: Four times a day (QID) | INTRAMUSCULAR | Status: DC | PRN

## 2020-01-01 MED ORDER — DOCUSATE SODIUM 100 MG PO CAPS
100.0000 mg | ORAL_CAPSULE | Freq: Two times a day (BID) | ORAL | Status: DC
  Administered 2020-01-01 – 2020-01-04 (×5): 100 mg via ORAL
  Filled 2020-01-01 (×7): qty 1

## 2020-01-01 MED ORDER — TRANEXAMIC ACID-NACL 1000-0.7 MG/100ML-% IV SOLN
INTRAVENOUS | Status: AC
  Filled 2020-01-01: qty 100

## 2020-01-01 MED ORDER — SODIUM CHLORIDE 0.9 % IR SOLN
Status: DC | PRN
  Administered 2020-01-01: 1000 mL

## 2020-01-01 MED ORDER — PHENOL 1.4 % MT LIQD
1.0000 | OROMUCOSAL | Status: DC | PRN
  Filled 2020-01-01: qty 177

## 2020-01-01 MED ORDER — EPHEDRINE SULFATE 50 MG/ML IJ SOLN
INTRAMUSCULAR | Status: DC | PRN
  Administered 2020-01-01: 10 mg via INTRAVENOUS

## 2020-01-01 MED ORDER — FENTANYL CITRATE (PF) 250 MCG/5ML IJ SOLN
INTRAMUSCULAR | Status: AC
  Filled 2020-01-01: qty 5

## 2020-01-01 MED ORDER — MUPIROCIN 2 % EX OINT
1.0000 "application " | TOPICAL_OINTMENT | Freq: Two times a day (BID) | CUTANEOUS | Status: DC
  Administered 2020-01-01 – 2020-01-04 (×6): 1 via NASAL
  Filled 2020-01-01: qty 22

## 2020-01-01 MED ORDER — CEFAZOLIN SODIUM-DEXTROSE 2-4 GM/100ML-% IV SOLN
2.0000 g | INTRAVENOUS | Status: AC
  Administered 2020-01-01: 2 g via INTRAVENOUS
  Filled 2020-01-01: qty 100

## 2020-01-01 MED ORDER — STERILE WATER FOR IRRIGATION IR SOLN
Status: DC | PRN
  Administered 2020-01-01: 1000 mL

## 2020-01-01 MED ORDER — PROPOFOL 500 MG/50ML IV EMUL
INTRAVENOUS | Status: DC | PRN
  Administered 2020-01-01: 50 ug/kg/min via INTRAVENOUS

## 2020-01-01 MED ORDER — METOCLOPRAMIDE HCL 5 MG/ML IJ SOLN: 5.0000 mg | Freq: Three times a day (TID) | INTRAMUSCULAR | Status: DC | PRN

## 2020-01-01 MED ORDER — CEFAZOLIN SODIUM-DEXTROSE 2-4 GM/100ML-% IV SOLN
INTRAVENOUS | Status: AC
  Filled 2020-01-01: qty 100

## 2020-01-01 MED ORDER — TRANEXAMIC ACID-NACL 1000-0.7 MG/100ML-% IV SOLN
1000.0000 mg | INTRAVENOUS | Status: AC
  Administered 2020-01-01: 1000 mg via INTRAVENOUS

## 2020-01-01 MED ORDER — CHLORHEXIDINE GLUCONATE 4 % EX LIQD
60.0000 mL | Freq: Once | CUTANEOUS | Status: DC
  Filled 2020-01-01: qty 60

## 2020-01-01 MED ORDER — OXYCODONE HCL 5 MG PO TABS: 5.0000 mg | ORAL_TABLET | Freq: Once | ORAL | Status: DC | PRN

## 2020-01-01 MED ORDER — PHENYLEPHRINE HCL-NACL 10-0.9 MG/250ML-% IV SOLN
INTRAVENOUS | Status: DC | PRN
  Administered 2020-01-01: 50 ug/min via INTRAVENOUS

## 2020-01-01 MED ORDER — FENTANYL CITRATE (PF) 100 MCG/2ML IJ SOLN
25.0000 ug | INTRAMUSCULAR | Status: DC | PRN
  Administered 2020-01-01 (×3): 50 ug via INTRAVENOUS

## 2020-01-01 MED ORDER — ONDANSETRON HCL 4 MG PO TABS: 4.0000 mg | ORAL_TABLET | Freq: Four times a day (QID) | ORAL | Status: DC | PRN

## 2020-01-01 MED ORDER — MENTHOL 3 MG MT LOZG
1.0000 | LOZENGE | OROMUCOSAL | Status: DC | PRN
  Filled 2020-01-01: qty 9

## 2020-01-01 MED ORDER — POVIDONE-IODINE 10 % EX SWAB: 2.0000 "application " | Freq: Once | CUTANEOUS | Status: DC

## 2020-01-01 MED ORDER — DEXAMETHASONE SODIUM PHOSPHATE 10 MG/ML IJ SOLN
INTRAMUSCULAR | Status: DC | PRN
  Administered 2020-01-01: 10 mg via INTRAVENOUS

## 2020-01-01 MED ORDER — ISOPROPYL ALCOHOL 70 % SOLN
Status: DC | PRN
  Administered 2020-01-01: 1 via TOPICAL

## 2020-01-01 MED ORDER — METOCLOPRAMIDE HCL 5 MG PO TABS: 5.0000 mg | ORAL_TABLET | Freq: Three times a day (TID) | ORAL | Status: DC | PRN

## 2020-01-01 MED ORDER — CHLORHEXIDINE GLUCONATE CLOTH 2 % EX PADS
6.0000 | MEDICATED_PAD | Freq: Every day | CUTANEOUS | Status: DC
  Administered 2020-01-02 – 2020-01-03 (×2): 6 via TOPICAL

## 2020-01-01 MED ORDER — FENTANYL CITRATE (PF) 100 MCG/2ML IJ SOLN
INTRAMUSCULAR | Status: DC | PRN
Start: 2020-01-01 — End: 2020-01-01
  Administered 2020-01-01: 50 ug via INTRAVENOUS

## 2020-01-01 MED ORDER — OXYCODONE HCL 5 MG/5ML PO SOLN: 5.0000 mg | Freq: Once | ORAL | Status: DC | PRN

## 2020-01-01 MED ORDER — CEFAZOLIN SODIUM-DEXTROSE 2-4 GM/100ML-% IV SOLN
2.0000 g | Freq: Four times a day (QID) | INTRAVENOUS | Status: AC
  Administered 2020-01-01 (×2): 2 g via INTRAVENOUS
  Filled 2020-01-01 (×3): qty 100

## 2020-01-01 MED ORDER — ENOXAPARIN SODIUM 40 MG/0.4ML ~~LOC~~ SOLN: 40.0000 mg | SUBCUTANEOUS | Status: DC

## 2020-01-01 MED ORDER — METHOCARBAMOL 500 MG PO TABS
500.0000 mg | ORAL_TABLET | Freq: Four times a day (QID) | ORAL | Status: DC | PRN
  Administered 2020-01-01 – 2020-01-02 (×2): 500 mg via ORAL
  Filled 2020-01-01 (×2): qty 1

## 2020-01-01 MED ORDER — PHENYLEPHRINE HCL (PRESSORS) 10 MG/ML IV SOLN
INTRAVENOUS | Status: DC | PRN
  Administered 2020-01-01: 80 ug via INTRAVENOUS
  Administered 2020-01-01: 120 ug via INTRAVENOUS
  Administered 2020-01-01: 160 ug via INTRAVENOUS
  Administered 2020-01-01: 200 ug via INTRAVENOUS

## 2020-01-01 MED ORDER — ONDANSETRON HCL 4 MG/2ML IJ SOLN
INTRAMUSCULAR | Status: DC | PRN
  Administered 2020-01-01: 4 mg via INTRAVENOUS

## 2020-01-01 MED ORDER — BUPIVACAINE IN DEXTROSE 0.75-8.25 % IT SOLN
INTRATHECAL | Status: DC | PRN
  Administered 2020-01-01: 1.2 mL via INTRATHECAL

## 2020-01-01 MED ORDER — ISOPROPYL ALCOHOL 70 % SOLN
Status: AC
  Filled 2020-01-01: qty 480

## 2020-01-01 MED ORDER — LACTATED RINGERS IV SOLN: INTRAVENOUS | Status: DC | PRN

## 2020-01-01 MED ORDER — NAPHAZOLINE-GLYCERIN 0.012-0.2 % OP SOLN
1.0000 [drp] | Freq: Four times a day (QID) | OPHTHALMIC | Status: DC | PRN
  Administered 2020-01-02: 1 [drp] via OPHTHALMIC
  Filled 2020-01-01: qty 15

## 2020-01-01 SURGICAL SUPPLY — 38 items
ADH SKN CLS APL DERMABOND .7 (GAUZE/BANDAGES/DRESSINGS) ×2
APL PRP STRL LF DISP 70% ISPRP (MISCELLANEOUS) ×1
BIT DRILL CANN LG 4.3MM (BIT) IMPLANT
CHLORAPREP W/TINT 26 (MISCELLANEOUS) ×2 IMPLANT
COVER PERINEAL POST (MISCELLANEOUS) ×2 IMPLANT
COVER SURGICAL LIGHT HANDLE (MISCELLANEOUS) ×2 IMPLANT
DERMABOND ADVANCED (GAUZE/BANDAGES/DRESSINGS) ×2
DERMABOND ADVANCED .7 DNX12 (GAUZE/BANDAGES/DRESSINGS) ×2 IMPLANT
DRAPE C-ARM 42X120 X-RAY (DRAPES) ×2 IMPLANT
DRAPE C-ARMOR (DRAPES) ×2 IMPLANT
DRAPE IMP U-DRAPE 54X76 (DRAPES) ×4 IMPLANT
DRAPE SHEET LG 3/4 BI-LAMINATE (DRAPES) ×4 IMPLANT
DRAPE STERI IOBAN 125X83 (DRAPES) ×2 IMPLANT
DRAPE U-SHAPE 47X51 STRL (DRAPES) ×4 IMPLANT
DRESSING AQUACEL AG SP 3.5X6 (GAUZE/BANDAGES/DRESSINGS) IMPLANT
DRILL BIT CANN LG 4.3MM (BIT) ×2
DRSG AQUACEL AG ADV 3.5X 4 (GAUZE/BANDAGES/DRESSINGS) ×2 IMPLANT
DRSG AQUACEL AG SP 3.5X6 (GAUZE/BANDAGES/DRESSINGS) ×2
DRSG MEPILEX BORDER 4X4 (GAUZE/BANDAGES/DRESSINGS) ×4 IMPLANT
FACESHIELD WRAPAROUND (MASK) ×4 IMPLANT
FACESHIELD WRAPAROUND OR TEAM (MASK) ×2 IMPLANT
GAUZE SPONGE 4X4 12PLY STRL (GAUZE/BANDAGES/DRESSINGS) ×2 IMPLANT
GLOVE BIO SURGEON STRL SZ8.5 (GLOVE) ×4 IMPLANT
GLOVE BIOGEL PI IND STRL 8.5 (GLOVE) ×1 IMPLANT
GLOVE BIOGEL PI INDICATOR 8.5 (GLOVE) ×1
GOWN SPEC L3 XXLG W/TWL (GOWN DISPOSABLE) ×2 IMPLANT
GUIDEPIN 3.2X17.5 THRD DISP (PIN) ×1 IMPLANT
HFN 125 DEG 11MM X 180MM (Orthopedic Implant) ×1 IMPLANT
HFN LAG SCREW 10.5MM X 85MM (Screw) ×1 IMPLANT
KIT BASIN OR (CUSTOM PROCEDURE TRAY) ×2 IMPLANT
KIT TURNOVER KIT A (KITS) ×1 IMPLANT
MANIFOLD NEPTUNE II (INSTRUMENTS) ×2 IMPLANT
MARKER SKIN DUAL TIP RULER LAB (MISCELLANEOUS) ×2 IMPLANT
PACK GENERAL/GYN (CUSTOM PROCEDURE TRAY) ×2 IMPLANT
PENCIL SMOKE EVACUATOR (MISCELLANEOUS) ×1 IMPLANT
SCREW BONE CORTICAL 5.0X3 (Screw) ×1 IMPLANT
SUT VIC AB 2-0 CT1 (SUTURE) ×1 IMPLANT
TOWEL OR 17X26 10 PK STRL BLUE (TOWEL DISPOSABLE) ×2 IMPLANT

## 2020-01-01 NOTE — Transfer of Care (Signed)
Immediate Anesthesia Transfer of Care Note  Patient: Calandria Mullings Aliano  Procedure(s) Performed: INTRAMEDULLARY (IM) NAIL INTERTROCHANTRIC (Left Hip)  Patient Location: PACU  Anesthesia Type:Spinal  Level of Consciousness: sedated, patient cooperative and responds to stimulation  Airway & Oxygen Therapy: Patient Spontanous Breathing and Patient connected to face mask oxygen  Post-op Assessment: Report given to RN and Post -op Vital signs reviewed and stable  Post vital signs: Reviewed and stable  Last Vitals:  Vitals Value Taken Time  BP 124/47 01/01/20 0947  Temp    Pulse 79 01/01/20 0949  Resp 15 01/01/20 0949  SpO2 92 % 01/01/20 0949  Vitals shown include unvalidated device data.  Last Pain:  Vitals:   01/01/20 0044  TempSrc:   PainSc: 10-Worst pain ever      Patients Stated Pain Goal: 2 (83/35/82 5189)  Complications: No complications documented.

## 2020-01-01 NOTE — Discharge Instructions (Signed)
 Dr. Yon Schiffman Adult Hip & Knee Specialist [facility] [address] [place], [place] [address] [phone]   POSTOPERATIVE DIRECTIONS    Hip Rehabilitation, Guidelines Following Surgery   WEIGHT BEARING Weight bearing as tolerated with assist device (walker, cane, etc) as directed, use it as long as suggested by your surgeon or therapist, typically at least 4-6 weeks.   HOME CARE INSTRUCTIONS  Remove items at home which could result in a fall. This includes throw rugs or furniture in walking pathways.  Continue medications as instructed at time of discharge.  You may have some home medications which will be placed on hold until you complete the course of blood thinner medication.  4 days after discharge, you may start showering. No tub baths or soaking your incisions. Do not put on socks or shoes without following the instructions of your caregivers.   Sit on chairs with arms. Use the chair arms to help push yourself up when arising.  Arrange for the use of a toilet seat elevator so you are not sitting low.   Walk with walker as instructed.  You may resume a sexual relationship in one month or when given the OK by your caregiver.  Use walker as long as suggested by your caregivers.  Avoid periods of inactivity such as sitting longer than an hour when not asleep. This helps prevent blood clots.  You may return to work once you are cleared by your surgeon.  Do not drive a car for 6 weeks or until released by your surgeon.  Do not drive while taking narcotics.  Wear elastic stockings for two weeks following surgery during the day but you may remove then at night.  Make sure you keep all of your appointments after your operation with all of your doctors and caregivers. You should call the office at the above phone number and make an appointment for approximately two weeks after the date of your surgery. Please pick up a stool softener and laxative  for home use as long as you are requiring pain medications.  ICE to the affected hip every three hours for 30 minutes at a time and then as needed for pain and swelling. Continue to use ice on the hip for pain and swelling from surgery. You may notice swelling that will progress down to the foot and ankle.  This is normal after surgery.  Elevate the leg when you are not up walking on it.   It is important for you to complete the blood thinner medication as prescribed by your doctor.  Continue to use the breathing machine which will help keep your temperature down.  It is common for your temperature to cycle up and down following surgery, especially at night when you are not up moving around and exerting yourself.  The breathing machine keeps your lungs expanded and your temperature down.  RANGE OF MOTION AND STRENGTHENING EXERCISES  These exercises are designed to help you keep full movement of your hip joint. Follow your caregiver's or physical therapist's instructions. Perform all exercises about fifteen times, three times per day or as directed. Exercise both hips, even if you have had only one joint replacement. These exercises can be done on a training (exercise) mat, on the floor, on a table or on a bed. Use whatever works the best and is most comfortable for you. Use music or television while you are exercising so that the exercises are a pleasant break in your day. This   will make your life better with the exercises acting as a break in routine you can look forward to.  Lying on your back, slowly slide your foot toward your buttocks, raising your knee up off the floor. Then slowly slide your foot back down until your leg is straight again.  Lying on your back spread your legs as far apart as you can without causing discomfort.  Lying on your side, raise your upper leg and foot straight up from the floor as far as is comfortable. Slowly lower the leg and repeat.  Lying on your back, tighten up the  muscle in the front of your thigh (quadriceps muscles). You can do this by keeping your leg straight and trying to raise your heel off the floor. This helps strengthen the largest muscle supporting your knee.  Lying on your back, tighten up the muscles of your buttocks both with the legs straight and with the knee bent at a comfortable angle while keeping your heel on the floor.   SKILLED REHAB INSTRUCTIONS: If the patient is transferred to a skilled rehab facility following release from the hospital, a list of the current medications will be sent to the facility for the patient to continue.  When discharged from the skilled rehab facility, please have the facility set up the patient's Home Health Physical Therapy prior to being released. Also, the skilled facility will be responsible for providing the patient with their medications at time of release from the facility to include their pain medication and their blood thinner medication. If the patient is still at the rehab facility at time of the two week follow up appointment, the skilled rehab facility will also need to assist the patient in arranging follow up appointment in our office and any transportation needs.  MAKE SURE YOU:  Understand these instructions.  Will watch your condition.  Will get help right away if you are not doing well or get worse.  Pick up stool softner and laxative for home use following surgery while on pain medications. Daily dry dressing changes as needed. In 4 days, you may remove your dressings and begin taking showers - no tub baths or soaking the incisions. Continue to use ice for pain and swelling after surgery. Do not use any lotions or creams on the incision until instructed by your surgeon.   

## 2020-01-01 NOTE — Progress Notes (Signed)
PROGRESS NOTE    Sara Garza   FFM:[id number]  DOB: [birth year]  DOA: 12/30/2019     2  PCP: [name], NP  CC: fall at home  Hospital Course: Sara Garza is a 86 y.o. female with medical history significant of chronic diastolic CHF, aortic stenosis status post [name] in 2014, hypertension, CKD stage III who presented with mechanical fall.   Patient tripped and fell on her left hip while walking to a dumpster.  She denied any prodromes of lightheaded or blurred vision before she fell down and denied any chest pain or shortness of breath.     She follows with cardiology for her CHF and [name], most recent echo in July 2021 showed EF 65 to 70% with asymmetrical wall thickening and diastolic dysfunction.   In the ER she underwent multiple xrays which showed "Acute comminuted left femoral ntertrochanteric fracture. No left hip dislocation."  She was evaluated by orthopedic surgery with tentative plans for repair on 01/01/2020. Cardiology was also consulted for further cardiac clearance prior to surgery.  She underwent IM nail fixation of left hip fx on 01/01/2020 and tolerated well.    Interval History:  Seen in room after surgery today.  Tolerated well, still sore as expected in her left hip.  Daughter is present bedside.  She does not think they will have adequate help at home in terms of recovering from surgery/rehab.  They would like to see how she does with PT tomorrow and Monday, then consider options for rehab.  Patient seems to prefer [facility] at this time.  Old records reviewed in assessment of this patient  ROS: Constitutional: negative for chills and fevers, Respiratory: negative for cough, Cardiovascular: negative for chest pain and Gastrointestinal: negative for abdominal pain  Assessment & Plan: Closed fracture of femur, intertrochanteric, left, initial encounter ([place]) - s/p mechanical fall at home - s/p IM nail fixation with ortho on 10/2, patient tolerated surgery  well - start with PT tomorrow - ortho recs are WBAT with walker - Lovenox while hospitalized then asa 81 mg BID x 6 weeks - continue pain control  Acute on chronic diastolic CHF (congestive heart failure) ([place]) - appreciate cardiology evaluation and assistance with clearance - caution with volume status; she is noted to have chronic LE edema and DOE - PRN lasix per cardiology  - see cardiology note; overall stable for surgery from cardiac standpoint  CKD (chronic kidney disease), stage III (HCC) -Baseline creatinine 1.3-1.4 -Currently at baseline on admission  Eye irritation - left eye periorbital irritation; no obvious conjunctivitis - place cool cloth over eye PRN - visine PRN at least 4 x daily  - continue glaucoma drops as well  Benign essential HTN - continue current regimen; BP stable - parameters placed on meds in case of hypoTN  Type 2 diabetes mellitus with renal manifestations, controlled ([place]) -Well-controlled with diet -Last A1c 5.7% on 12/01/2019   Antimicrobials: none  DVT prophylaxis: Lovenox Code Status: Full Family Communication: none present Disposition Plan: Status is: Inpatient  Remains inpatient appropriate because:Unsafe d/c plan, IV treatments appropriate due to intensity of illness or inability to take PO, Inpatient level of care appropriate due to severity of illness and surgery on 10/2   Dispo: The patient is from: Home              Anticipated d/c is to: pending PT eval after surgery              Anticipated d/c date  is: 3 days              Patient currently is not medically stable to d/c.  Objective: Blood pressure (!) 145/53, pulse 86, temperature 98.5 F (36.9 C), temperature source Oral, resp. rate 16, weight 71.7 kg, SpO2 96 %.  Examination: General appearance: alert, cooperative and no distress Head: Normocephalic, without obvious abnormality, atraumatic Eyes: EOMI Lungs: clear to auscultation bilaterally Heart: regular rate and  rhythm and S1, S2 normal Abdomen: soft, NT, ND, BS present Extremities: left hip noted with post surgical dressings in place, mild TTP around, no oozing; compartments are soft Skin: mobility and turgor normal Neurologic: pain limiting strength in LLE however still has adequate strength on assessment, no paresthesias, otherwise no focal deficits   Consultants:   Cardiology  Ortho  Procedures:   IM nail fixation, 10/2  Data Reviewed: I have personally reviewed following labs and imaging studies Results for orders placed or performed during the hospital encounter of 12/30/19 (from the past 24 hour(s))  Surgical PCR screen     Status: None   Collection Time: 01/01/20  5:05 AM   Specimen: Nasal Mucosa; Nasal Swab  Result Value Ref Range   MRSA, PCR NEGATIVE NEGATIVE   Staphylococcus aureus NEGATIVE NEGATIVE  Basic metabolic panel     Status: Abnormal   Collection Time: 01/01/20  6:29 AM  Result Value Ref Range   Sodium 137 135 - 145 mmol/L   Potassium 5.1 3.5 - 5.1 mmol/L   Chloride 104 98 - 111 mmol/L   CO2 24 22 - 32 mmol/L   Glucose, Bld 130 (H) 70 - 99 mg/dL   BUN 41 (H) 8 - 23 mg/dL   Creatinine, Ser 1.56 (H) 0.44 - 1.00 mg/dL   Calcium 9.4 8.9 - 10.3 mg/dL   GFR calc non Af Amer 28 (L) >60 mL/min   GFR calc Af Amer 33 (L) >60 mL/min   Anion gap 9 5 - 15  CBC with Differential/Platelet     Status: Abnormal   Collection Time: 01/01/20  6:29 AM  Result Value Ref Range   WBC 7.5 4.0 - 10.5 K/uL   RBC 2.87 (L) 3.87 - 5.11 MIL/uL   Hemoglobin 9.0 (L) 12.0 - 15.0 g/dL   HCT 28.4 (L) 36 - 46 %   MCV 99.0 80.0 - 100.0 fL   MCH 31.4 26.0 - 34.0 pg   MCHC 31.7 30.0 - 36.0 g/dL   RDW 13.6 11.5 - 15.5 %   Platelets 192 150 - 400 K/uL   nRBC 0.0 0.0 - 0.2 %   Neutrophils Relative % 78 %   Neutro Abs 5.9 1.7 - 7.7 K/uL   Lymphocytes Relative 10 %   Lymphs Abs 0.8 0.7 - 4.0 K/uL   Monocytes Relative 10 %   Monocytes Absolute 0.7 0 - 1 K/uL   Eosinophils Relative 1 %    Eosinophils Absolute 0.1 0 - 0 K/uL   Basophils Relative 0 %   Basophils Absolute 0.0 0 - 0 K/uL   Immature Granulocytes 1 %   Abs Immature Granulocytes 0.05 0.00 - 0.07 K/uL  Magnesium     Status: Abnormal   Collection Time: 01/01/20  6:29 AM  Result Value Ref Range   Magnesium 2.6 (H) 1.7 - 2.4 mg/dL  Glucose, capillary     Status: Abnormal   Collection Time: 01/01/20  9:53 AM  Result Value Ref Range   Glucose-Capillary 122 (H) 70 - 99 mg/dL  Recent Results (from the past 240 hour(s))  Respiratory Panel by RT PCR (Flu A&B, Covid) - Nasopharyngeal Swab     Status: None   Collection Time: 12/30/19  2:29 PM   Specimen: Nasopharyngeal Swab  Result Value Ref Range Status   SARS Coronavirus 2 by RT PCR NEGATIVE NEGATIVE Final    Comment: (NOTE) SARS-CoV-2 target nucleic acids are NOT DETECTED.  The SARS-CoV-2 RNA is generally detectable in upper respiratoy specimens during the acute phase of infection. The lowest concentration of SARS-CoV-2 viral copies this assay can detect is 131 copies/mL. A negative result does not preclude SARS-Cov-2 infection and should not be used as the sole basis for treatment or other patient management decisions. A negative result may occur with  improper specimen collection/handling, submission of specimen other than nasopharyngeal swab, presence of viral mutation(s) within the areas targeted by this assay, and inadequate number of viral copies (<131 copies/mL). A negative result must be combined with clinical observations, patient history, and epidemiological information. The expected result is Negative.  Fact Sheet for Patients:  PinkCheek.be  Fact Sheet for Healthcare Providers:  GravelBags.it  This test is no t yet approved or cleared by the [place] FDA and  has been authorized for detection and/or diagnosis of SARS-CoV-2 by FDA under an Emergency Use Authorization (EUA). This EUA  will remain  in effect (meaning this test can be used) for the duration of the COVID-19 declaration under Section 564(b)(1) of the Act, 21 U.S.C. section 360bbb-3(b)(1), unless the authorization is terminated or revoked sooner.     Influenza A by PCR NEGATIVE NEGATIVE Final   Influenza B by PCR NEGATIVE NEGATIVE Final    Comment: (NOTE) The Xpert Xpress SARS-CoV-2/FLU/RSV assay is intended as an aid in  the diagnosis of influenza from Nasopharyngeal swab specimens and  should not be used as a sole basis for treatment. Nasal washings and  aspirates are unacceptable for Xpert Xpress SARS-CoV-2/FLU/RSV  testing.  Fact Sheet for Patients: PinkCheek.be  Fact Sheet for Healthcare Providers: GravelBags.it  This test is not yet approved or cleared by the [place] FDA and  has been authorized for detection and/or diagnosis of SARS-CoV-2 by  FDA under an Emergency Use Authorization (EUA). This EUA will remain  in effect (meaning this test can be used) for the duration of the  Covid-19 declaration under Section 564(b)(1) of the Act, 21  U.S.C. section 360bbb-3(b)(1), unless the authorization is  terminated or revoked. Performed at [facility], [place] [address], [place], [place] [address]   Surgical PCR screen     Status: None   Collection Time: 01/01/20  5:05 AM   Specimen: Nasal Mucosa; Nasal Swab  Result Value Ref Range Status   MRSA, PCR NEGATIVE NEGATIVE Final   Staphylococcus aureus NEGATIVE NEGATIVE Final    Comment: (NOTE) The Xpert SA Assay (FDA approved for NASAL specimens in patients 13 years of age and older), is one component of a comprehensive surveillance program. It is not intended to diagnose infection nor to guide or monitor treatment. Performed at [facility], [place] [address], [place],  [address]      Radiology Studies: DG Pelvis 1-2 Views  Result Date:  12/30/2019 CLINICAL DATA:  Pt c/o left groin pain s/p fall today while carrying trash to dumpster. EXAM: PELVIS - 1-2 VIEW; DG HIP (WITH OR WITHOUT PELVIS) 2-3V LEFT; LEFT FEMUR 2 VIEWS COMPARISON:  X-ray pelvis 07/31/2013. FINDINGS: Acute, comminuted, left femoral intertrochanteric fracture. No left  hip dislocation. No acute fracture or dislocation of the left femur distally. The left knee is visualized on the AP view and demonstrates severe medial tibiofemoral compartment degenerative changes with marked joint space narrowing and osteophyte marginal formation. There is no evidence of pelvic fracture or diastasis. No pelvic bone lesions are seen. Visualized portions of the lower lumbar spine demonstrates degenerative changes. Partially visualized sacrum is grossly unremarkable with limited evaluation due to overlying bowel. Right hip arthroplasty. The surgical hardware femoral head component appears to be in alignment with the acetabulum. No evidence of proximal right femoral fracture. Vascular calcifications. Similar-appearing peripherally calcified calcification overlying the left pelvis. Right inguinal region surgical clips. Round calcification overlying the region of the left gluteus soft tissues likely represents an injection granuloma. IMPRESSION: 1. Acute comminuted left femoral intertrochanteric fracture. No left hip dislocation. 2. Right hip arthroplasty with no radiographic evidence of surgical hardware complication. 3. No acute displaced fracture or diastasis of the bones of the pelvis. 4. Severe left knee degenerative changes with no findings to suggest acute displaced fracture or dislocation of the bones of the distal left femur. Please note limited evaluation of the left knee with only a single AP view. Electronically Signed   By: Iven Finn M.D.   On: 12/30/2019 15:38   Pelvis Portable  Result Date: 01/01/2020 CLINICAL DATA:  Left femoral intramedullary nail placement. EXAM: PORTABLE PELVIS  1-2 VIEWS COMPARISON:  01/01/2020 FINDINGS: Status post intramedullary fixation of a left femoral intratrochanteric fracture. Similar alignment relative to intraoperative fluoroscopic imaging. Mild distraction of the major fracture fragments, otherwise anatomic in alignment. Persistent medial displacement of lesser trochanteric fracture fragment. Right total hip arthroplasty without radiographic evidence of complication. No no fracture identified. Both hips are located. Atherosclerotic vascular calcifications. IMPRESSION: Status post intramedullary fixation of a left femoral intratrochanteric fracture. Similar alignment relative to recent intraoperative fluoroscopic imaging. Persistent medial displacement of lesser trochanteric fracture fragment Electronically Signed   By: Margaretha Sheffield MD   On: 01/01/2020 12:41   DG Chest Portable 1 View  Result Date: 12/30/2019 CLINICAL DATA:  Fall EXAM: PORTABLE CHEST 1 VIEW COMPARISON:  Chest x-ray 03/23/2018. FINDINGS: The heart size and mediastinal contours are unchanged. Aortic valve replacement. Mitral annular calcifications again noted. Aortic arch calcifications. No focal consolidation. No pulmonary edema. No pleural effusion. No pneumothorax. Multilevel degenerative changes of the visualized spine. Degenerative changes of bilateral shoulders. No acute displaced fracture. IMPRESSION: No active cardiopulmonary disease. Electronically Signed   By: Iven Finn M.D.   On: 12/30/2019 15:41   DG C-Arm 1-60 Min-No Report  Result Date: 01/01/2020 Fluoroscopy was utilized by the requesting physician.  No radiographic interpretation.   DG HIP OPERATIVE UNILAT WITH PELVIS LEFT  Result Date: 01/01/2020 CLINICAL DATA:  LEFT hip intramedullary rod EXAM: OPERATIVE LEFT HIP (WITH PELVIS IF PERFORMED) 2 VIEWS TECHNIQUE: Fluoroscopic spot image(s) were submitted for interpretation post-operatively. COMPARISON:  December 30, 2019 FINDINGS: Spot fluoroscopy images were  obtained for surgical planning purposes. Revisualization of comminuted inter trochanteric fracture. Spot fluoroscopy images demonstrate placement of an intramedullary rod with improved near anatomic alignment of the fracture fragments. Lesser trochanter remains displaced. Osteopenia. No additional acute fracture or dislocation noted. IMPRESSION: Spot fluoroscopy images were obtained for surgical planning purposes. Status post LEFT intramedullary rod fixation. Electronically Signed   By: Valentino Saxon MD   On: 01/01/2020 10:55   DG Hip Unilat W or Wo Pelvis 2-3 Views Left  Result Date: 12/30/2019 CLINICAL DATA:  Pt c/o left groin  pain s/p fall today while carrying trash to dumpster. EXAM: PELVIS - 1-2 VIEW; DG HIP (WITH OR WITHOUT PELVIS) 2-3V LEFT; LEFT FEMUR 2 VIEWS COMPARISON:  X-ray pelvis 07/31/2013. FINDINGS: Acute, comminuted, left femoral intertrochanteric fracture. No left hip dislocation. No acute fracture or dislocation of the left femur distally. The left knee is visualized on the AP view and demonstrates severe medial tibiofemoral compartment degenerative changes with marked joint space narrowing and osteophyte marginal formation. There is no evidence of pelvic fracture or diastasis. No pelvic bone lesions are seen. Visualized portions of the lower lumbar spine demonstrates degenerative changes. Partially visualized sacrum is grossly unremarkable with limited evaluation due to overlying bowel. Right hip arthroplasty. The surgical hardware femoral head component appears to be in alignment with the acetabulum. No evidence of proximal right femoral fracture. Vascular calcifications. Similar-appearing peripherally calcified calcification overlying the left pelvis. Right inguinal region surgical clips. Round calcification overlying the region of the left gluteus soft tissues likely represents an injection granuloma. IMPRESSION: 1. Acute comminuted left femoral intertrochanteric fracture. No left hip  dislocation. 2. Right hip arthroplasty with no radiographic evidence of surgical hardware complication. 3. No acute displaced fracture or diastasis of the bones of the pelvis. 4. Severe left knee degenerative changes with no findings to suggest acute displaced fracture or dislocation of the bones of the distal left femur. Please note limited evaluation of the left knee with only a single AP view. Electronically Signed   By: Iven Finn M.D.   On: 12/30/2019 15:38   DG Femur Min 2 Views Left  Result Date: 12/30/2019 CLINICAL DATA:  Pt c/o left groin pain s/p fall today while carrying trash to dumpster. EXAM: PELVIS - 1-2 VIEW; DG HIP (WITH OR WITHOUT PELVIS) 2-3V LEFT; LEFT FEMUR 2 VIEWS COMPARISON:  X-ray pelvis 07/31/2013. FINDINGS: Acute, comminuted, left femoral intertrochanteric fracture. No left hip dislocation. No acute fracture or dislocation of the left femur distally. The left knee is visualized on the AP view and demonstrates severe medial tibiofemoral compartment degenerative changes with marked joint space narrowing and osteophyte marginal formation. There is no evidence of pelvic fracture or diastasis. No pelvic bone lesions are seen. Visualized portions of the lower lumbar spine demonstrates degenerative changes. Partially visualized sacrum is grossly unremarkable with limited evaluation due to overlying bowel. Right hip arthroplasty. The surgical hardware femoral head component appears to be in alignment with the acetabulum. No evidence of proximal right femoral fracture. Vascular calcifications. Similar-appearing peripherally calcified calcification overlying the left pelvis. Right inguinal region surgical clips. Round calcification overlying the region of the left gluteus soft tissues likely represents an injection granuloma. IMPRESSION: 1. Acute comminuted left femoral intertrochanteric fracture. No left hip dislocation. 2. Right hip arthroplasty with no radiographic evidence of surgical  hardware complication. 3. No acute displaced fracture or diastasis of the bones of the pelvis. 4. Severe left knee degenerative changes with no findings to suggest acute displaced fracture or dislocation of the bones of the distal left femur. Please note limited evaluation of the left knee with only a single AP view. Electronically Signed   By: Iven Finn M.D.   On: 12/30/2019 15:38   Pelvis Portable  Final Result    DG C-Arm 1-60 Min-No Report  Final Result    DG HIP OPERATIVE UNILAT WITH PELVIS LEFT  Final Result    DG Pelvis 1-2 Views  Final Result    DG Femur Min 2 Views Left  Final Result    DG Chest Portable  1 View  Final Result    DG Hip Unilat W or Wo Pelvis 2-3 Views Left  Final Result      Scheduled Meds: . amLODipine  7.5 mg Oral Daily  . calcium-vitamin D  1 tablet Oral Q breakfast  . carvedilol  6.25 mg Oral BID WC  . Chlorhexidine Gluconate Cloth  6 each Topical Daily  . docusate sodium  100 mg Oral BID  . [START ON 01/02/2020] enoxaparin (LOVENOX) injection  30 mg Subcutaneous Q24H  . ezetimibe  10 mg Oral Daily  . feeding supplement (ENSURE ENLIVE)  237 mL Oral BID BM  . fentaNYL      . fentaNYL      . isosorbide mononitrate  60 mg Oral Daily  . latanoprost  1 drop Both Eyes QHS  . losartan  25 mg Oral Daily  . multivitamin with minerals  1 tablet Oral Daily  . mupirocin ointment  1 application Nasal BID  . senna  1 tablet Oral BID   PRN Meds: HYDROcodone-acetaminophen, HYDROmorphone (DILAUDID) injection, menthol-cetylpyridinium **OR** phenol, methocarbamol **OR** methocarbamol (ROBAXIN) IV, metoCLOPramide **OR** metoCLOPramide (REGLAN) injection, naphazoline-glycerin, ondansetron **OR** ondansetron (ZOFRAN) IV, prochlorperazine Continuous Infusions: .  ceFAZolin (ANCEF) IV    . methocarbamol (ROBAXIN) IV        LOS: 2 days  Time spent: Greater than 50% of the 35 minute visit was spent in counseling/coordination of care for the patient as laid  out in the A&P.   Dwyane Dee, MD [facility] 01/01/2020, 2:49 PM  Contact via secure chat.  To contact the attending provider between 7A-7P or the covering provider during after hours 7P-7A, please log into the web site [email or web address] and access using universal [place] password for that web site. If you do not have the password, please call the hospital operator.

## 2020-01-01 NOTE — Progress Notes (Signed)
Pt transported to PACU 

## 2020-01-01 NOTE — Progress Notes (Signed)
Dr Lyla Glassing aware pt o2 sats  95% on 3L. Pt may return to room at this time.

## 2020-01-01 NOTE — Progress Notes (Signed)
D; Pt from flower via bed with tele box. Hooked monitor and prepared IVF and Ancef 2g & Tranxemic acid @ bed side.

## 2020-01-01 NOTE — Anesthesia Procedure Notes (Signed)
Spinal  Patient location during procedure: OR Start time: 01/01/2020 8:15 AM End time: 01/01/2020 8:17 AM Staffing Performed: anesthesiologist  Anesthesiologist: Albertha Ghee, MD Preanesthetic Checklist Completed: patient identified, IV checked, risks and benefits discussed, surgical consent, monitors and equipment checked, pre-op evaluation and timeout performed Spinal Block Patient position: left lateral decubitus Prep: DuraPrep Patient monitoring: cardiac monitor, continuous pulse ox and blood pressure Approach: midline Location: L3-4 Injection technique: single-shot Needle Needle type: Pencan  Needle gauge: 24 G Needle length: 9 cm Assessment Sensory level: T10 Additional Notes Functioning IV was confirmed and monitors were applied. Sterile prep and drape, including hand hygiene and sterile gloves were used. The patient was positioned and the spine was prepped. The skin was anesthetized with lidocaine.  Free flow of clear CSF was obtained prior to injecting local anesthetic into the CSF.  The spinal needle aspirated freely following injection.  The needle was carefully withdrawn.  The patient tolerated the procedure well.

## 2020-01-01 NOTE — Assessment & Plan Note (Signed)
-   left eye periorbital irritation; no obvious conjunctivitis - place cool cloth over eye PRN - visine PRN at least 4 x daily  - continue glaucoma drops as well

## 2020-01-01 NOTE — Anesthesia Preprocedure Evaluation (Addendum)
Anesthesia Evaluation  Patient identified by MRN, date of birth, ID band Patient awake    Reviewed: Allergy & Precautions, H&P , NPO status , Patient's Chart, lab work & pertinent test results  Airway Mallampati: II   Neck ROM: full    Dental   Pulmonary neg pulmonary ROS,    breath sounds clear to auscultation       Cardiovascular hypertension, + CAD, + Cardiac Stents and +CHF  + Valvular Problems/Murmurs  Rhythm:regular Rate:Normal  H/o [name] (2014) for AS TTE (7/20201): EF 60%, moderate mitral stenosis with mean gradient of 7.5 mmHg.  Small PFO with left to right flow.   Neuro/Psych  Neuromuscular disease    GI/Hepatic   Endo/Other  diabetes, Type 2  Renal/GU Renal InsufficiencyRenal disease     Musculoskeletal  (+) Arthritis ,   Abdominal   Peds  Hematology  (+) anemia ,   Anesthesia Other Findings   Reproductive/Obstetrics                             Anesthesia Physical Anesthesia Plan  ASA: III  Anesthesia Plan: MAC and Spinal   Post-op Pain Management:    Induction: Intravenous  PONV Risk Score and Plan: 2 and Ondansetron, Dexamethasone and Treatment may vary due to age or medical condition  Airway Management Planned: Simple Face Mask  Additional Equipment:   Intra-op Plan:   Post-operative Plan:   Informed Consent: I have reviewed the patients History and Physical, chart, labs and discussed the procedure including the risks, benefits and alternatives for the proposed anesthesia with the patient or authorized representative who has indicated his/her understanding and acceptance.       Plan Discussed with: CRNA, Anesthesiologist and Surgeon  Anesthesia Plan Comments:        Anesthesia Quick Evaluation

## 2020-01-01 NOTE — Op Note (Signed)
OPERATIVE REPORT  SURGEON: Rod Can, MD   ASSISTANT: Staff.  PREOPERATIVE DIAGNOSIS: Left intertrochanteric femur fracture.   POSTOPERATIVE DIAGNOSIS: Left intertrochanteric femur fracture.   PROCEDURE: Intramedullary fixation, Left femur.   IMPLANTS: Biomet Affixus Hip Fracture Nail, 11 by 180 mm, 125 degrees. 10.5 x 85 mm Hip Fracture Nail Lag Screw. 5 x 34 mm distal interlocking screw 1.  ANESTHESIA:  Spinal  ESTIMATED BLOOD LOSS:-50 mL    ANTIBIOTICS: 2 g Ancef.  DRAINS: None.  COMPLICATIONS: None.   CONDITION: PACU - hemodynamically stable.[name]   BRIEF CLINICAL NOTE: Sara Garza is a 86 y.o. female who presented with an intertrochanteric femur fracture. The patient was admitted to the hospitalist service and underwent perioperative risk stratification and medical optimization. The risks, benefits, and alternatives to the procedure were explained, and the patient elected to proceed.  PROCEDURE IN DETAIL: Surgical site was marked by myself. The patient was taken to the operating room and spinal anesthesia was induced on the bed. A foley catheter was placed. The patient was then transferred to the [facility] table and the nonoperative lower extremity was scissored underneath the operative side. The fracture was reduced with traction, internal rotation, and adduction. The hip was prepped and draped in the normal sterile surgical fashion. Timeout was called verifying side and site of surgery. Preop antibiotics were given with 60 minutes of beginning the procedure.  Fluoroscopy was used to define the patient's anatomy. A 4 cm incision was made just proximal to the tip of the greater trochanter. The awl was used to obtain the standard starting point for a trochanteric entry nail under fluoroscopic control. The guidepin was placed. The entry reamer was used to open the proximal femur.  On the back table, the nail was assembled onto the jig. The nail was placed into the femur without  any difficulty. Through a separate stab incision, the cannula was placed down to the bone in preparation for the cephalomedullary device. A guidepin was placed into the femoral head using AP and lateral fluoroscopy views. The pin was measured, and then reaming was performed to the appropriate depth. The lag screw was inserted to the appropriate depth. The fracture was compressed through the jig. The setscrew was tightened and then loosened one quarter turn. A separate stab incision was created, and the distal interlocking screw was placed using standard AO technique. The jig was removed. Final AP and lateral fluoroscopy views were obtained to confirm fracture reduction and hardware placement. Tip apex distance was appropriate. There was no chondral penetration.  The wounds were copiously irrigated with saline. The wound was closed in layers with #1 Vicryl for the fascia, 2-0 Monocryl for the deep dermal layer, and 3-0 Monocryl subcuticular stitch. Glue was applied to the skin. Once the glue was fully hardened, sterile dressing was applied. The patient was then awakened from anesthesia and taken to the PACU in stable condition. Sponge needle and instrument counts were correct at the end of the case 2. There were no known complications.  We will readmit the patient to the hospitalist. Weightbearing status will be weightbearing as tolerated with a walker. We will begin Lovenox for DVT prophylaxis and discharge on ASA 81 mg PO BID for 6 weeks. The patient will work with physical therapy and undergo disposition planning.

## 2020-01-01 NOTE — Interval H&P Note (Signed)
History and Physical Interval Note:  01/01/2020 7:44 AM  D[place]ois  has presented today for surgery, with the diagnosis of LEFT INTERTROCHANTERIC HIP FRACTURE.  The various methods of treatment have been discussed with the patient and family. After consideration of risks, benefits and other options for treatment, the patient has consented to  Procedure(s): INTRAMEDULLARY (IM) NAIL INTERTROCHANTRIC (Left) as a surgical intervention.  The patient's history has been reviewed, patient examined, no change in status, stable for surgery.  I have reviewed the patient's chart and labs.  Questions were answered to the patient's satisfaction.     Hilton Cork Delsie Amador

## 2020-01-02 LAB — CBC WITH DIFFERENTIAL/PLATELET
Abs Immature Granulocytes: 0.07 10*3/uL (ref 0.00–0.07)
Basophils Absolute: 0 10*3/uL (ref 0.0–0.1)
Basophils Relative: 0 %
Eosinophils Absolute: 0 10*3/uL (ref 0.0–0.5)
Eosinophils Relative: 0 %
HCT: 23.9 % — ABNORMAL LOW (ref 36.0–46.0)
Hemoglobin: 7.9 g/dL — ABNORMAL LOW (ref 12.0–15.0)
Immature Granulocytes: 1 %
Lymphocytes Relative: 9 %
Lymphs Abs: 0.6 10*3/uL — ABNORMAL LOW (ref 0.7–4.0)
MCH: 30.7 pg (ref 26.0–34.0)
MCHC: 33.1 g/dL (ref 30.0–36.0)
MCV: 93 fL (ref 80.0–100.0)
Monocytes Absolute: 1 10*3/uL (ref 0.1–1.0)
Monocytes Relative: 13 %
Neutro Abs: 5.6 10*3/uL (ref 1.7–7.7)
Neutrophils Relative %: 77 %
Platelets: 153 10*3/uL (ref 150–400)
RBC: 2.57 MIL/uL — ABNORMAL LOW (ref 3.87–5.11)
RDW: 13.1 % (ref 11.5–15.5)
WBC: 7.3 10*3/uL (ref 4.0–10.5)
nRBC: 0 % (ref 0.0–0.2)

## 2020-01-02 LAB — BASIC METABOLIC PANEL
Anion gap: 10 (ref 5–15)
BUN: 41 mg/dL — ABNORMAL HIGH (ref 8–23)
CO2: 21 mmol/L — ABNORMAL LOW (ref 22–32)
Calcium: 8.8 mg/dL — ABNORMAL LOW (ref 8.9–10.3)
Chloride: 103 mmol/L (ref 98–111)
Creatinine, Ser: 1.46 mg/dL — ABNORMAL HIGH (ref 0.44–1.00)
GFR calc Af Amer: 35 mL/min — ABNORMAL LOW (ref >60)
GFR calc non Af Amer: 30 mL/min — ABNORMAL LOW (ref >60)
Glucose, Bld: 127 mg/dL — ABNORMAL HIGH (ref 70–99)
Potassium: 5.3 mmol/L — ABNORMAL HIGH (ref 3.5–5.1)
Sodium: 134 mmol/L — ABNORMAL LOW (ref 135–145)

## 2020-01-02 LAB — MAGNESIUM: Magnesium: 2.3 mg/dL (ref 1.7–2.4)

## 2020-01-02 NOTE — Evaluation (Signed)
Physical Therapy Evaluation Patient Details Name: Sara Garza MRN: [id number] DOB: 05/23/1923 Today's Date: 01/02/2020   History of Present Illness  Pt s/p fall with L intertrochanteric femur fx and now s/p IM nailing.  Pt with hx of DM, aortic stenosis, CAD carotic stenosis, cardiac valve replacement, and R hip fx s/p hemi-arthroplasty (2015)  Clinical Impression  Pt admitted as above and presenting with functional mobility limitations 2* decreased L LE strength/ROM, post op pain, balance deficits and limited endurance.  Pt currently requires increased time and significant assist of two for performance of all mobility tasks and would benefit from follow up rehab at SNF level to maximize IND and safety prior to dc home with very limited assist.    Follow Up Recommendations SNF    Equipment Recommendations  None recommended by PT    Recommendations for Other Services       Precautions / Restrictions Precautions Precautions: Fall Restrictions Weight Bearing Restrictions: No LLE Weight Bearing: Weight bearing as tolerated      Mobility  Bed Mobility Overal bed mobility: Needs Assistance Bed Mobility: Supine to Sit     Supine to sit: Mod assist;+2 for physical assistance;+2 for safety/equipment     General bed mobility comments: Increased time, step by step cues and assist of pad to transition to sitting EOB  Transfers Overall transfer level: Needs assistance Equipment used: Rolling walker (2 wheeled) Transfers: Sit to/from [facility] Sit to Stand: Mod assist;+2 physical assistance;+2 safety/equipment Stand pivot transfers: Mod assist;+2 physical assistance;+2 safety/equipment       General transfer comment: cues for LE management and use of UEs to self assist;  Physical assist to bring wt up and fwd and to balance in standing with RW  Ambulation/Gait Ambulation/Gait assistance: Mod assist;+2 physical assistance;+2 safety/equipment Gait Distance  (Feet): 2 Feet Assistive device: Rolling walker (2 wheeled) Gait Pattern/deviations: Step-to pattern;Decreased step length - right;Decreased step length - left;Shuffle;Trunk flexed;Antalgic;Decreased stance time - left Gait velocity: decr   General Gait Details: INcreased time with cues for sequence, posture, position from RW and increased UE WB  Stairs            Wheelchair Mobility    Modified Rankin (Stroke Patients Only)       Balance Overall balance assessment: Needs assistance Sitting-balance support: Feet supported;No upper extremity supported Sitting balance-Leahy Scale: Fair     Standing balance support: Bilateral upper extremity supported Standing balance-Leahy Scale: Poor                               Pertinent Vitals/Pain Pain Assessment: Faces Faces Pain Scale: Hurts little more Pain Location: L hip Pain Descriptors / Indicators: Grimacing;Guarding;Sore Pain Intervention(s): Limited activity within patient's tolerance;Monitored during session;Premedicated before session    Home Living Family/patient expects to be discharged to:: Unsure Living Arrangements: Alone                    Prior Function Level of Independence: Independent         Comments: Lives alone; uses cane and rollator as needed     Hand Dominance   Dominant Hand: Right    Extremity/Trunk Assessment   Upper Extremity Assessment Upper Extremity Assessment: Overall WFL for tasks assessed    Lower Extremity Assessment Lower Extremity Assessment: LLE deficits/detail    Cervical / Trunk Assessment Cervical / Trunk Assessment: Kyphotic  Communication   Communication: HOH  Cognition Arousal/Alertness: Awake/alert  Behavior During Therapy: WFL for tasks assessed/performed Overall Cognitive Status: Within Functional Limits for tasks assessed                                        General Comments      Exercises General Exercises -  Lower Extremity Ankle Circles/Pumps: AROM;Both;20 reps;Supine   Assessment/Plan    PT Assessment Patient needs continued PT services  PT Problem List Decreased strength;Decreased range of motion;Decreased activity tolerance;Decreased balance;Decreased mobility;Decreased knowledge of use of DME;Pain       PT Treatment Interventions DME instruction;Gait training;Functional mobility training;Therapeutic activities;Therapeutic exercise;Balance training;Patient/family education    PT Goals (Current goals can be found in the Care Plan section)  Acute Rehab PT Goals Patient Stated Goal: Regain IND PT Goal Formulation: With patient Time For Goal Achievement: 01/16/20 Potential to Achieve Goals: Good    Frequency Min 3X/week   Barriers to discharge Decreased caregiver support Pt lives alone    Co-evaluation               AM-PAC PT "6 Clicks" Mobility  Outcome Measure Help needed turning from your back to your side while in a flat bed without using bedrails?: A Lot Help needed moving from lying on your back to sitting on the side of a flat bed without using bedrails?: A Lot Help needed moving to and from a bed to a chair (including a wheelchair)?: A Lot Help needed standing up from a chair using your arms (e.g., wheelchair or bedside chair)?: A Lot Help needed to walk in hospital room?: A Lot Help needed climbing 3-5 steps with a railing? : Total 6 Click Score: 11    End of Session Equipment Utilized During Treatment: Gait belt Activity Tolerance: Patient limited by fatigue;Patient limited by pain Patient left: in chair;with call bell/phone within reach;with chair alarm set;with SCD's reapplied Nurse Communication: Mobility status PT Visit Diagnosis: Difficulty in walking, not elsewhere classified (R26.2);Muscle weakness (generalized) (M62.81)    Time: 6389-3734 PT Time Calculation (min) (ACUTE ONLY): 33 min   Charges:   PT Evaluation $PT Eval Low Complexity: 1 Low PT  Treatments $Therapeutic Activity: 8-22 mins        Debe Coder PT Acute Rehabilitation Services Pager [phone] Office [phone]   Remmy Riffe 01/02/2020, 12:15 PM

## 2020-01-02 NOTE — Progress Notes (Signed)
     Subjective: 1 Day Post-Op Procedure(s) (LRB): INTRAMEDULLARY (IM) NAIL INTERTROCHANTRIC (Left)   Patient reports pain as mild, pain controlled. Slight pain with ROM of the left hip.  No reported events throughout the night.  Discussed the procedure and expectations moving forward.    Objective:   VITALS:   Vitals:   01/01/20 2148 01/02/20 0514  BP: (!) 122/49 (!) 148/50  Pulse: 76 72  Resp: 16   Temp: 98.2 F (36.8 C) (!) 97.5 F (36.4 C)  SpO2: 96% 94%    Dorsiflexion/Plantar flexion intact Incision: dressing C/D/I No cellulitis present Compartment soft  LABS Recent Labs    12/31/19 0554 01/01/20 0629 01/02/20 0841  HGB 9.3* 9.0* 7.9*  HCT 28.5* 28.4* 23.9*  WBC 6.5 7.5 7.3  PLT 176 192 153    Recent Labs    12/31/19 0554 01/01/20 0629 01/02/20 0650  NA 140 137 134*  K 5.2* 5.1 5.3*  BUN 37* 41* 41*  CREATININE 1.45* 1.56* 1.46*  GLUCOSE 135* 130* 127*     Assessment/Plan: 1 Day Post-Op Procedure(s) (LRB): INTRAMEDULLARY (IM) NAIL INTERTROCHANTRIC (Left)   Up with therapy Discharge disposition TBD        Danae Orleans PA-C  [facility]  [place] [address], [address], [place], [place] [address] Phone: [phone] [email or web address] Facebook  [employer]

## 2020-01-02 NOTE — NC FL2 (Signed)
[place] LEVEL OF CARE SCREENING TOOL     IDENTIFICATION  Patient Name: Sara Garza [facility] Birthdate: 04-16-1923 Sex: female Admission Date (Current Location): 12/30/2019  [facility] and [place] Number:  Herbalist and Address:  [facility],  [place], [place]      Provider Number: [id number]  Attending Physician Name and Address:  Dwyane Dee, MD  Relative Name and Phone Number:  Sara Garza [phone]    Current Level of Care: SNF Recommended Level of Care: [place] Prior Approval Number:    Date Approved/Denied:   PASRR Number: [id number]  Discharge Plan: SNF    Current Diagnoses: Patient Active Problem List   Diagnosis Date Noted  . Eye irritation 01/01/2020  . Mitral valve stenosis   . Benign essential HTN   . Chronic diastolic CHF (congestive heart failure) ([place])   . Hip fracture ([place]) 12/30/2019  . Closed fracture of femur, intertrochanteric, left, initial encounter ([place])   . Acute on chronic diastolic CHF (congestive heart failure) ([place]) 02/25/2018  . S/P [name] (transcatheter aortic valve replacement) 02/25/2018  . Leg pain   . Hyperlipidemia   . Glaucoma   . Carotid stenosis, left   . Arthritis   . Urinary incontinence 02/12/2017  . Chronic bilateral low back pain 08/12/2016  . Spondylosis of lumbar joint 08/12/2016  . Bilateral leg pain 08/12/2016  . Neck mass 04/15/2015  . Status post hip hemiarthroplasty 07/31/2013  . CAD (coronary artery disease) 07/31/2013  . CKD (chronic kidney disease), stage III ([place]) 07/31/2013  . Left bundle branch block 07/31/2013  . Severe calcific aortic stenosis 06/18/2010  . Diabetic polyneuropathy ([place]) 04/16/2010  . GLAUCOMA 01/06/2009  . CHEST PAIN, ATYPICAL 12/16/2008  . Bilateral shoulder pain 11/16/2007  . OSTEOARTHRITIS 02/02/2007  . Type 2 diabetes mellitus with renal manifestations, controlled ([place]) 10/24/2006  . Dyslipidemia 10/02/2006  .  Essential hypertension 10/02/2006    Orientation RESPIRATION BLADDER Height & Weight     Self, Time, Situation, Place  Normal Incontinent Weight: 158 lb 1.1 oz (71.7 kg) Height:  4\' 9"  (144.8 cm)  BEHAVIORAL SYMPTOMS/MOOD NEUROLOGICAL BOWEL NUTRITION STATUS      Continent Diet  AMBULATORY STATUS COMMUNICATION OF NEEDS Skin   Total Care Verbally Normal                       Personal Care Assistance Level of Assistance  Bathing, Dressing, Feeding, Total care Bathing Assistance: Maximum assistance Feeding assistance: Maximum assistance Dressing Assistance: Maximum assistance Total Care Assistance: Maximum assistance   Functional Limitations Info  Hearing, Sight, Speech Sight Info: Adequate Hearing Info: Adequate Speech Info: Adequate    SPECIAL CARE FACTORS FREQUENCY  OT (By licensed OT), PT (By licensed PT)     PT Frequency: 5x per week OT Frequency: 5x per week            Contractures Contractures Info: Not present    Additional Factors Info  Allergies   Allergies Info: Statins and gapapentin           Current Medications (01/02/2020):  This is the current hospital active medication list Current Facility-Administered Medications  Medication Dose Route Frequency Provider Last Rate Last Admin  . amLODipine (NORVASC) tablet 7.5 mg  7.5 mg Oral Daily Swinteck, Aaron Edelman, MD   7.5 mg at 01/02/20 1143  . calcium-vitamin D (OSCAL WITH D) 500-200 MG-UNIT per tablet 1 tablet  1 tablet Oral Q breakfast Swinteck, Aaron Edelman,  MD   1 tablet at 12/31/19 0820  . carvedilol (COREG) tablet 6.25 mg  6.25 mg Oral BID WC Rod Can, MD   6.25 mg at 01/02/20 0800  . Chlorhexidine Gluconate Cloth 2 % PADS 6 each  6 each Topical Daily Swinteck, Aaron Edelman, MD      . docusate sodium (COLACE) capsule 100 mg  100 mg Oral BID Rod Can, MD   100 mg at 01/01/20 2125  . enoxaparin (LOVENOX) injection 30 mg  30 mg Subcutaneous Q24H Leodis Sias T, RPH   30 mg at 01/02/20 0801  .  ezetimibe (ZETIA) tablet 10 mg  10 mg Oral Daily Rod Can, MD   10 mg at 01/02/20 1139  . feeding supplement (ENSURE ENLIVE) (ENSURE ENLIVE) liquid 237 mL  237 mL Oral BID BM Rod Can, MD   237 mL at 01/02/20 1155  . HYDROcodone-acetaminophen (NORCO/VICODIN) 5-325 MG per tablet 1-2 tablet  1-2 tablet Oral Q6H PRN Rod Can, MD   1 tablet at 01/02/20 0934  . HYDROmorphone (DILAUDID) injection 0.5 mg  0.5 mg Intravenous Q4H PRN Rod Can, MD   0.5 mg at 01/01/20 0041  . isosorbide mononitrate (IMDUR) 24 hr tablet 60 mg  60 mg Oral Daily Rod Can, MD   60 mg at 01/02/20 1143  . latanoprost (XALATAN) 0.005 % ophthalmic solution 1 drop  1 drop Both Eyes QHS Rod Can, MD   1 drop at 12/31/19 2238  . losartan (COZAAR) tablet 25 mg  25 mg Oral Daily Rod Can, MD   25 mg at 01/02/20 1139  . menthol-cetylpyridinium (CEPACOL) lozenge 3 mg  1 lozenge Oral PRN Swinteck, Aaron Edelman, MD       Or  . phenol (CHLORASEPTIC) mouth spray 1 spray  1 spray Mouth/Throat PRN Swinteck, Aaron Edelman, MD      . methocarbamol (ROBAXIN) tablet 500 mg  500 mg Oral Q6H PRN Rod Can, MD   500 mg at 01/02/20 1610   Or  . methocarbamol (ROBAXIN) 500 mg in dextrose 5 % 50 mL IVPB  500 mg Intravenous Q6H PRN Swinteck, Aaron Edelman, MD      . metoCLOPramide (REGLAN) tablet 5-10 mg  5-10 mg Oral Q8H PRN Swinteck, Aaron Edelman, MD       Or  . metoCLOPramide (REGLAN) injection 5-10 mg  5-10 mg Intravenous Q8H PRN Swinteck, Aaron Edelman, MD      . multivitamin with minerals tablet 1 tablet  1 tablet Oral Daily Rod Can, MD   1 tablet at 01/02/20 1141  . mupirocin ointment (BACTROBAN) 2 % 1 application  1 application Nasal BID Rod Can, MD   1 application at 96/04/54 2125  . naphazoline-glycerin (CLEAR EYES REDNESS) ophth solution 1-2 drop  1-2 drop Both Eyes QID PRN Dwyane Dee, MD   1 drop at 01/02/20 0806  . ondansetron (ZOFRAN) tablet 4 mg  4 mg Oral Q6H PRN Swinteck, Aaron Edelman, MD       Or  .  ondansetron (ZOFRAN) injection 4 mg  4 mg Intravenous Q6H PRN Swinteck, Aaron Edelman, MD      . prochlorperazine (COMPAZINE) injection 5 mg  5 mg Intravenous Q4H PRN Rod Can, MD   5 mg at 12/31/19 1631  . senna (SENOKOT) tablet 8.6 mg  1 tablet Oral BID Rod Can, MD   8.6 mg at 01/02/20 1142     Discharge Medications: Please see discharge summary for a list of discharge medications.  Relevant Imaging Results:  Relevant Lab Results:   Additional Information [id number]  Greg Cutter, LCSW

## 2020-01-02 NOTE — Progress Notes (Signed)
PROGRESS NOTE    Sara Garza   ERX:[id number]  DOB: Sep 27, 1923  DOA: 12/30/2019     3  PCP: [name], NP  CC: fall at home  Hospital Course: Sara Garza is a 86 y.o. female with medical history significant of chronic diastolic CHF, aortic stenosis status post [name] in 2014, hypertension, CKD stage III who presented with mechanical fall.   Patient tripped and fell on her left hip while walking to a dumpster.  She denied any prodromes of lightheaded or blurred vision before she fell down and denied any chest pain or shortness of breath.     She follows with cardiology for her CHF and [name], most recent echo in July 2021 showed EF 65 to 70% with asymmetrical wall thickening and diastolic dysfunction.   In the ER she underwent multiple xrays which showed "Acute comminuted left femoral ntertrochanteric fracture. No left hip dislocation."  She was evaluated by orthopedic surgery with tentative plans for repair on 01/01/2020. Cardiology was also consulted for further cardiac clearance prior to surgery.  She underwent IM nail fixation of left hip fx on 01/01/2020 and tolerated well.  PT evaluated patient on 10/3 and recommends SNF for ongoing rehab.    Interval History:  No events overnight.  Worked some with physical therapy this morning, mainly getting to the recliner from bed.  Still having pain in left hip from surgery as expected but overall seems to be doing okay.  She understands the probable need for rehab at discharge.  Old records reviewed in assessment of this patient  ROS: Constitutional: negative for chills and fevers, Respiratory: negative for cough, Cardiovascular: negative for chest pain and Gastrointestinal: negative for abdominal pain  Assessment & Plan: Closed fracture of femur, intertrochanteric, left, initial encounter ([place]) - s/p mechanical fall at home - s/p IM nail fixation with ortho on 10/2, patient tolerated surgery well -PT recommending SNF for  ongoing rehab at discharge.  Daughter stated that they did not have adequate help or care at home and also would be considering rehab at discharge - ortho recs are WBAT with walker - Lovenox while hospitalized then asa 81 mg BID x 6 weeks - continue pain control  Acute on chronic diastolic CHF (congestive heart failure) ([place]) - appreciate cardiology evaluation and assistance with clearance - caution with volume status; she is noted to have chronic LE edema and DOE - PRN lasix per cardiology  - see cardiology note; overall stable for surgery from cardiac standpoint  CKD (chronic kidney disease), stage III (HCC) -Baseline creatinine 1.3-1.4 -Currently at baseline on admission  Eye irritation - left eye periorbital irritation; no obvious conjunctivitis - place cool cloth over eye PRN - visine PRN at least 4 x daily  - continue glaucoma drops as well  Benign essential HTN - continue current regimen; BP stable - parameters placed on meds in case of hypoTN  Type 2 diabetes mellitus with renal manifestations, controlled ([place]) -Well-controlled with diet -Last A1c 5.7% on 12/01/2019   Antimicrobials: none  DVT prophylaxis: Lovenox Code Status: Full Family Communication: none present Disposition Plan: Status is: Inpatient  Remains inpatient appropriate because:Unsafe d/c plan, IV treatments appropriate due to intensity of illness or inability to take PO and Inpatient level of care appropriate due to severity of illness   Dispo: The patient is from: Home              Anticipated d/c is to: pending PT eval after surgery  Anticipated d/c date is: 3 days              Patient currently is not medically stable to d/c.  Objective: Blood pressure (!) 148/50, pulse 72, temperature (!) 97.5 F (36.4 C), temperature source Oral, resp. rate 16, height 4\' 9"  (1.448 m), weight 71.7 kg, SpO2 94 %.  Examination: General appearance: alert, cooperative and no distress Head:  Normocephalic, without obvious abnormality, atraumatic Eyes: EOMI Lungs: clear to auscultation bilaterally Heart: regular rate and rhythm and S1, S2 normal Abdomen: soft, NT, ND, BS present Extremities: left hip noted with post surgical dressings in place, mild TTP around, no oozing; compartments are soft Skin: mobility and turgor normal Neurologic: pain limiting strength in LLE however still has adequate strength on assessment, no paresthesias, otherwise no focal deficits   Consultants:   Cardiology  Ortho  Procedures:   IM nail fixation, 10/2  Data Reviewed: I have personally reviewed following labs and imaging studies Results for orders placed or performed during the hospital encounter of 12/30/19 (from the past 24 hour(s))  Basic metabolic panel     Status: Abnormal   Collection Time: 01/02/20  6:50 AM  Result Value Ref Range   Sodium 134 (L) 135 - 145 mmol/L   Potassium 5.3 (H) 3.5 - 5.1 mmol/L   Chloride 103 98 - 111 mmol/L   CO2 21 (L) 22 - 32 mmol/L   Glucose, Bld 127 (H) 70 - 99 mg/dL   BUN 41 (H) 8 - 23 mg/dL   Creatinine, Ser 1.46 (H) 0.44 - 1.00 mg/dL   Calcium 8.8 (L) 8.9 - 10.3 mg/dL   GFR calc non Af Amer 30 (L) >60 mL/min   GFR calc Af Amer 35 (L) >60 mL/min   Anion gap 10 5 - 15  Magnesium     Status: None   Collection Time: 01/02/20  6:50 AM  Result Value Ref Range   Magnesium 2.3 1.7 - 2.4 mg/dL  CBC with Differential/Platelet     Status: Abnormal   Collection Time: 01/02/20  8:41 AM  Result Value Ref Range   WBC 7.3 4.0 - 10.5 K/uL   RBC 2.57 (L) 3.87 - 5.11 MIL/uL   Hemoglobin 7.9 (L) 12.0 - 15.0 g/dL   HCT 23.9 (L) 36 - 46 %   MCV 93.0 80.0 - 100.0 fL   MCH 30.7 26.0 - 34.0 pg   MCHC 33.1 30.0 - 36.0 g/dL   RDW 13.1 11.5 - 15.5 %   Platelets 153 150 - 400 K/uL   nRBC 0.0 0.0 - 0.2 %   Neutrophils Relative % 77 %   Neutro Abs 5.6 1.7 - 7.7 K/uL   Lymphocytes Relative 9 %   Lymphs Abs 0.6 (L) 0.7 - 4.0 K/uL   Monocytes Relative 13 %    Monocytes Absolute 1.0 0 - 1 K/uL   Eosinophils Relative 0 %   Eosinophils Absolute 0.0 0 - 0 K/uL   Basophils Relative 0 %   Basophils Absolute 0.0 0 - 0 K/uL   Immature Granulocytes 1 %   Abs Immature Granulocytes 0.07 0.00 - 0.07 K/uL    Recent Results (from the past 240 hour(s))  Respiratory Panel by RT PCR (Flu A&B, Covid) - Nasopharyngeal Swab     Status: None   Collection Time: 12/30/19  2:29 PM   Specimen: Nasopharyngeal Swab  Result Value Ref Range Status   SARS Coronavirus 2 by RT PCR NEGATIVE NEGATIVE Final    Comment: (NOTE)  SARS-CoV-2 target nucleic acids are NOT DETECTED.  The SARS-CoV-2 RNA is generally detectable in upper respiratoy specimens during the acute phase of infection. The lowest concentration of SARS-CoV-2 viral copies this assay can detect is 131 copies/mL. A negative result does not preclude SARS-Cov-2 infection and should not be used as the sole basis for treatment or other patient management decisions. A negative result may occur with  improper specimen collection/handling, submission of specimen other than nasopharyngeal swab, presence of viral mutation(s) within the areas targeted by this assay, and inadequate number of viral copies (<131 copies/mL). A negative result must be combined with clinical observations, patient history, and epidemiological information. The expected result is Negative.  Fact Sheet for Patients:  PinkCheek.be  Fact Sheet for Healthcare Providers:  GravelBags.it  This test is no t yet approved or cleared by the [place] FDA and  has been authorized for detection and/or diagnosis of SARS-CoV-2 by FDA under an Emergency Use Authorization (EUA). This EUA will remain  in effect (meaning this test can be used) for the duration of the COVID-19 declaration under Section 564(b)(1) of the Act, 21 U.S.C. section 360bbb-3(b)(1), unless the authorization is terminated  or revoked sooner.     Influenza A by PCR NEGATIVE NEGATIVE Final   Influenza B by PCR NEGATIVE NEGATIVE Final    Comment: (NOTE) The Xpert Xpress SARS-CoV-2/FLU/RSV assay is intended as an aid in  the diagnosis of influenza from Nasopharyngeal swab specimens and  should not be used as a sole basis for treatment. Nasal washings and  aspirates are unacceptable for Xpert Xpress SARS-CoV-2/FLU/RSV  testing.  Fact Sheet for Patients: PinkCheek.be  Fact Sheet for Healthcare Providers: GravelBags.it  This test is not yet approved or cleared by the [place] FDA and  has been authorized for detection and/or diagnosis of SARS-CoV-2 by  FDA under an Emergency Use Authorization (EUA). This EUA will remain  in effect (meaning this test can be used) for the duration of the  Covid-19 declaration under Section 564(b)(1) of the Act, 21  U.S.C. section 360bbb-3(b)(1), unless the authorization is  terminated or revoked. Performed at [facility], [place] [address], [place], [place] [address]   Surgical PCR screen     Status: None   Collection Time: 01/01/20  5:05 AM   Specimen: Nasal Mucosa; Nasal Swab  Result Value Ref Range Status   MRSA, PCR NEGATIVE NEGATIVE Final   Staphylococcus aureus NEGATIVE NEGATIVE Final    Comment: (NOTE) The Xpert SA Assay (FDA approved for NASAL specimens in patients 19 years of age and older), is one component of a comprehensive surveillance program. It is not intended to diagnose infection nor to guide or monitor treatment. Performed at [facility], [place] [address], [place], [place] [address]      Radiology Studies: Pelvis Portable  Result Date: 01/01/2020 CLINICAL DATA:  Left femoral intramedullary nail placement. EXAM: PORTABLE PELVIS 1-2 VIEWS COMPARISON:  01/01/2020 FINDINGS: Status post intramedullary fixation of a left femoral intratrochanteric  fracture. Similar alignment relative to intraoperative fluoroscopic imaging. Mild distraction of the major fracture fragments, otherwise anatomic in alignment. Persistent medial displacement of lesser trochanteric fracture fragment. Right total hip arthroplasty without radiographic evidence of complication. No no fracture identified. Both hips are located. Atherosclerotic vascular calcifications. IMPRESSION: Status post intramedullary fixation of a left femoral intratrochanteric fracture. Similar alignment relative to recent intraoperative fluoroscopic imaging. Persistent medial displacement of lesser trochanteric fracture fragment Electronically Signed   By: Margaretha Sheffield MD  On: 01/01/2020 12:41   DG C-Arm 1-60 Min-No Report  Result Date: 01/01/2020 Fluoroscopy was utilized by the requesting physician.  No radiographic interpretation.   DG HIP OPERATIVE UNILAT WITH PELVIS LEFT  Result Date: 01/01/2020 CLINICAL DATA:  LEFT hip intramedullary rod EXAM: OPERATIVE LEFT HIP (WITH PELVIS IF PERFORMED) 2 VIEWS TECHNIQUE: Fluoroscopic spot image(s) were submitted for interpretation post-operatively. COMPARISON:  December 30, 2019 FINDINGS: Spot fluoroscopy images were obtained for surgical planning purposes. Revisualization of comminuted inter trochanteric fracture. Spot fluoroscopy images demonstrate placement of an intramedullary rod with improved near anatomic alignment of the fracture fragments. Lesser trochanter remains displaced. Osteopenia. No additional acute fracture or dislocation noted. IMPRESSION: Spot fluoroscopy images were obtained for surgical planning purposes. Status post LEFT intramedullary rod fixation. Electronically Signed   By: Valentino Saxon MD   On: 01/01/2020 10:55   Pelvis Portable  Final Result    DG C-Arm 1-60 Min-No Report  Final Result    DG HIP OPERATIVE UNILAT WITH PELVIS LEFT  Final Result    DG Pelvis 1-2 Views  Final Result    DG Femur Min 2 Views Left   Final Result    DG Chest Portable 1 View  Final Result    DG Hip Unilat W or Wo Pelvis 2-3 Views Left  Final Result      Scheduled Meds: . amLODipine  7.5 mg Oral Daily  . calcium-vitamin D  1 tablet Oral Q breakfast  . carvedilol  6.25 mg Oral BID WC  . Chlorhexidine Gluconate Cloth  6 each Topical Daily  . docusate sodium  100 mg Oral BID  . enoxaparin (LOVENOX) injection  30 mg Subcutaneous Q24H  . ezetimibe  10 mg Oral Daily  . feeding supplement (ENSURE ENLIVE)  237 mL Oral BID BM  . isosorbide mononitrate  60 mg Oral Daily  . latanoprost  1 drop Both Eyes QHS  . losartan  25 mg Oral Daily  . multivitamin with minerals  1 tablet Oral Daily  . mupirocin ointment  1 application Nasal BID  . senna  1 tablet Oral BID   PRN Meds: HYDROcodone-acetaminophen, HYDROmorphone (DILAUDID) injection, menthol-cetylpyridinium **OR** phenol, methocarbamol **OR** methocarbamol (ROBAXIN) IV, metoCLOPramide **OR** metoCLOPramide (REGLAN) injection, naphazoline-glycerin, ondansetron **OR** ondansetron (ZOFRAN) IV, prochlorperazine Continuous Infusions: . methocarbamol (ROBAXIN) IV        LOS: 3 days  Time spent: Greater than 50% of the 35 minute visit was spent in counseling/coordination of care for the patient as laid out in the A&P.   Dwyane Dee, MD [facility] 01/02/2020, 2:13 PM  Contact via secure chat.  To contact the attending provider between 7A-7P or the covering provider during after hours 7P-7A, please log into the web site [email or web address] and access using universal [place] password for that web site. If you do not have the password, please call the hospital operator.

## 2020-01-02 NOTE — Progress Notes (Signed)
Physical Therapy Treatment Patient Details Name: Sara Garza MRN: [id number] DOB: [birth year] Today's Date: 01/02/2020    History of Present Illness Pt s/p fall with L intertrochanteric femur fx and now s/p IM nailing.  Pt with hx of DM, aortic stenosis, CAD carotic stenosis, cardiac valve replacement, and R hip fx s/p hemi-arthroplasty (2015)    PT Comments    Pt continues cooperative but anxious and requiring increased time and significant assist of 2 for performance of all basic mobility tasks.   Follow Up Recommendations  SNF     Equipment Recommendations  None recommended by PT    Recommendations for Other Services       Precautions / Restrictions Precautions Precautions: Fall Restrictions Weight Bearing Restrictions: No LLE Weight Bearing: Weight bearing as tolerated    Mobility  Bed Mobility Overal bed mobility: Needs Assistance Bed Mobility: Sit to Supine     Supine to sit: Mod assist;+2 for physical assistance;+2 for safety/equipment Sit to supine: Mod assist;+2 for physical assistance;+2 for safety/equipment   General bed mobility comments: Increased time, step by step cues and assist to manage LEs and to control trunk  Transfers Overall transfer level: Needs assistance Equipment used: Rolling walker (2 wheeled) Transfers: Sit to/from [facility] Sit to Stand: Mod assist;+2 physical assistance;+2 safety/equipment Stand pivot transfers: Mod assist;+2 physical assistance;+2 safety/equipment       General transfer comment: cues for LE management and use of UEs to self assist;  Physical assist to bring wt up and fwd and to balance in standing with RW  Ambulation/Gait Ambulation/Gait assistance: Mod assist;+2 physical assistance;+2 safety/equipment Gait Distance (Feet): 2 Feet Assistive device: Rolling walker (2 wheeled) Gait Pattern/deviations: Step-to pattern;Decreased step length - right;Decreased step length - left;Shuffle;Trunk  flexed;Antalgic;Decreased stance time - left Gait velocity: decr   General Gait Details: INcreased time with cues for sequence, posture, position from RW and increased UE WB   Stairs             Wheelchair Mobility    Modified Rankin (Stroke Patients Only)       Balance Overall balance assessment: Needs assistance Sitting-balance support: Feet supported;No upper extremity supported Sitting balance-Leahy Scale: Fair     Standing balance support: Bilateral upper extremity supported Standing balance-Leahy Scale: Poor                              Cognition Arousal/Alertness: Awake/alert Behavior During Therapy: WFL for tasks assessed/performed Overall Cognitive Status: Within Functional Limits for tasks assessed                                        Exercises General Exercises - Lower Extremity Ankle Circles/Pumps: AROM;Both;20 reps;Supine    General Comments        Pertinent Vitals/Pain Pain Assessment: Faces Faces Pain Scale: Hurts little more Pain Location: L hip Pain Descriptors / Indicators: Grimacing;Guarding;Sore Pain Intervention(s): Limited activity within patient's tolerance;Monitored during session;Premedicated before session    Home Living Family/patient expects to be discharged to:: Unsure Living Arrangements: Alone                  Prior Function Level of Independence: Independent      Comments: Lives alone; uses cane and rollator as needed   PT Goals (current goals can now be found in the care plan section) Acute Rehab  PT Goals Patient Stated Goal: Regain IND PT Goal Formulation: With patient Time For Goal Achievement: 01/16/20 Potential to Achieve Goals: Good Progress towards PT goals: Progressing toward goals    Frequency    Min 3X/week      PT Plan Current plan remains appropriate    Co-evaluation              AM-PAC PT "6 Clicks" Mobility   Outcome Measure  Help needed turning  from your back to your side while in a flat bed without using bedrails?: A Lot Help needed moving from lying on your back to sitting on the side of a flat bed without using bedrails?: A Lot Help needed moving to and from a bed to a chair (including a wheelchair)?: A Lot Help needed standing up from a chair using your arms (e.g., wheelchair or bedside chair)?: A Lot Help needed to walk in hospital room?: A Lot Help needed climbing 3-5 steps with a railing? : Total 6 Click Score: 11    End of Session Equipment Utilized During Treatment: Gait belt Activity Tolerance: Patient limited by fatigue;Patient limited by pain Patient left: in bed;with call bell/phone within reach;with bed alarm set Nurse Communication: Mobility status PT Visit Diagnosis: Difficulty in walking, not elsewhere classified (R26.2);Muscle weakness (generalized) (M62.81)     Time: 6295-2841 PT Time Calculation (min) (ACUTE ONLY): 23 min  Charges:  $Gait Training: 8-22 mins $Therapeutic Activity: 8-22 mins                     [place] Pager [phone] Office [phone]    Jazyah Butsch 01/02/2020, 2:58 PM

## 2020-01-03 DIAGNOSIS — D62 Acute posthemorrhagic anemia: Secondary | ICD-10-CM

## 2020-01-03 LAB — BASIC METABOLIC PANEL
Anion gap: 7 (ref 5–15)
BUN: 58 mg/dL — ABNORMAL HIGH (ref 8–23)
CO2: 24 mmol/L (ref 22–32)
Calcium: 8.5 mg/dL — ABNORMAL LOW (ref 8.9–10.3)
Chloride: 106 mmol/L (ref 98–111)
Creatinine, Ser: 1.77 mg/dL — ABNORMAL HIGH (ref 0.44–1.00)
GFR calc Af Amer: 28 mL/min — ABNORMAL LOW (ref >60)
GFR calc non Af Amer: 24 mL/min — ABNORMAL LOW (ref >60)
Glucose, Bld: 88 mg/dL (ref 70–99)
Potassium: 5.2 mmol/L — ABNORMAL HIGH (ref 3.5–5.1)
Sodium: 137 mmol/L (ref 135–145)

## 2020-01-03 LAB — CBC WITH DIFFERENTIAL/PLATELET
Abs Immature Granulocytes: 0.05 10*3/uL (ref 0.00–0.07)
Basophils Absolute: 0 10*3/uL (ref 0.0–0.1)
Basophils Relative: 0 %
Eosinophils Absolute: 0.1 10*3/uL (ref 0.0–0.5)
Eosinophils Relative: 1 %
HCT: 22.4 % — ABNORMAL LOW (ref 36.0–46.0)
Hemoglobin: 7.1 g/dL — ABNORMAL LOW (ref 12.0–15.0)
Immature Granulocytes: 1 %
Lymphocytes Relative: 16 %
Lymphs Abs: 1.2 10*3/uL (ref 0.7–4.0)
MCH: 31.3 pg (ref 26.0–34.0)
MCHC: 31.7 g/dL (ref 30.0–36.0)
MCV: 98.7 fL (ref 80.0–100.0)
Monocytes Absolute: 0.9 10*3/uL (ref 0.1–1.0)
Monocytes Relative: 13 %
Neutro Abs: 5 10*3/uL (ref 1.7–7.7)
Neutrophils Relative %: 69 %
Platelets: 184 10*3/uL (ref 150–400)
RBC: 2.27 MIL/uL — ABNORMAL LOW (ref 3.87–5.11)
RDW: 13.5 % (ref 11.5–15.5)
WBC: 7.3 10*3/uL (ref 4.0–10.5)
nRBC: 0 % (ref 0.0–0.2)

## 2020-01-03 LAB — MAGNESIUM: Magnesium: 2.6 mg/dL — ABNORMAL HIGH (ref 1.7–2.4)

## 2020-01-03 LAB — PREPARE RBC (CROSSMATCH)

## 2020-01-03 MED ORDER — SODIUM CHLORIDE 0.9% IV SOLUTION: Freq: Once | INTRAVENOUS | Status: DC

## 2020-01-03 MED ORDER — SENNOSIDES-DOCUSATE SODIUM 8.6-50 MG PO TABS
1.0000 | ORAL_TABLET | Freq: Two times a day (BID) | ORAL | Status: DC
  Administered 2020-01-03 – 2020-01-04 (×2): 1 via ORAL
  Filled 2020-01-03 (×3): qty 1

## 2020-01-03 MED ORDER — HYDROCODONE-ACETAMINOPHEN 5-325 MG PO TABS
1.0000 | ORAL_TABLET | Freq: Four times a day (QID) | ORAL | 0 refills | Status: DC | PRN
Start: 2020-01-03 — End: 2020-01-04

## 2020-01-03 MED ORDER — ACETAMINOPHEN 500 MG PO TABS
1000.0000 mg | ORAL_TABLET | Freq: Four times a day (QID) | ORAL | Status: DC
  Administered 2020-01-03 – 2020-01-04 (×4): 1000 mg via ORAL
  Filled 2020-01-03 (×4): qty 2

## 2020-01-03 MED ORDER — POLYETHYLENE GLYCOL 3350 17 G PO PACK
17.0000 g | PACK | Freq: Every day | ORAL | Status: DC
  Administered 2020-01-03 – 2020-01-04 (×2): 17 g via ORAL
  Filled 2020-01-03 (×2): qty 1

## 2020-01-03 MED ORDER — FUROSEMIDE 20 MG PO TABS
20.0000 mg | ORAL_TABLET | Freq: Once | ORAL | Status: AC
  Administered 2020-01-03: 20 mg via ORAL
  Filled 2020-01-03: qty 1

## 2020-01-03 MED ORDER — OXYCODONE HCL 5 MG PO TABS: 5.0000 mg | ORAL_TABLET | ORAL | Status: DC | PRN

## 2020-01-03 MED ORDER — ASPIRIN 81 MG PO CHEW: 81.0000 mg | CHEWABLE_TABLET | Freq: Two times a day (BID) | ORAL | 0 refills | Status: AC

## 2020-01-03 NOTE — Progress Notes (Signed)
PROGRESS NOTE    Sara Garza   JQG:[id number]  DOB: Feb 01, 1924  DOA: 12/30/2019     4  PCP: Sara Peng, NP  CC: fall at home  Hospital Course: Sara Garza is a 86 y.o. female with medical history significant of chronic diastolic CHF, aortic stenosis status post [name] in 2014, hypertension, CKD stage III who presented with mechanical fall.   Patient tripped and fell on her left hip while walking to a dumpster.  She denied any prodromes of lightheaded or blurred vision before she fell down and denied any chest pain or shortness of breath.     She follows with cardiology for her CHF and [name], most recent echo in July 2021 showed EF 65 to 70% with asymmetrical wall thickening and diastolic dysfunction.   In the ER she underwent multiple xrays which showed "Acute comminuted left femoral ntertrochanteric fracture. No left hip dislocation."  She was evaluated by orthopedic surgery with tentative plans for repair on 01/01/2020. Cardiology was also consulted for further cardiac clearance prior to surgery.  She underwent IM nail fixation of left hip fx on 01/01/2020 and tolerated well.  PT evaluated patient on 10/3 and recommends SNF for ongoing rehab.    Interval History:  No events overnight.  Still having hip pain this am but states she had been rolled over onto left side so it hurt more today. Left eye is less red and she sees "less haze".  Daughter is bedside this morning and they are planning on going to rehab at discharge.   Old records reviewed in assessment of this patient  ROS: Constitutional: negative for chills and fevers, Respiratory: negative for cough, Cardiovascular: negative for chest pain and Gastrointestinal: negative for abdominal pain  Assessment & Plan: Closed fracture of femur, intertrochanteric, left, initial encounter (Sara Garza) - s/p mechanical fall at home - s/p IM nail fixation with ortho on 10/2, patient tolerated surgery well -PT recommending SNF for  ongoing rehab at discharge.  Daughter stated that they did not have adequate help or care at home and also would be considering rehab at discharge - ortho recs are WBAT with walker - Lovenox while hospitalized then asa 81 mg BID x 6 weeks - continue pain control  Acute on chronic diastolic CHF (congestive heart failure) ([employer]) - appreciate cardiology evaluation and assistance with clearance - caution with volume status; she is noted to have chronic LE edema and DOE - PRN lasix per cardiology  - see cardiology note; overall stable for surgery from cardiac standpoint  CKD (chronic kidney disease), stage III (HCC) -Baseline creatinine 1.3-1.4 -Currently at baseline on admission  Acute blood loss anemia -Patient's baseline is around 11 g/dL.  She has drifted down to 7.1 g/dL this morning and semisymptomatic, she also has underlying heart disease.  In this setting we will go ahead and transfuse 1 unit PRBC on 01/03/2020 -Give Lasix 20 mg PO after blood transfusion  Eye irritation - left eye periorbital irritation; no obvious conjunctivitis - place cool cloth over eye PRN - visine PRN at least 4 x daily  - continue glaucoma drops as well  Benign essential HTN - continue current regimen; BP stable - parameters placed on meds in case of hypoTN  Type 2 diabetes mellitus with renal manifestations, controlled ([place]) -Well-controlled with diet -Last A1c 5.7% on 12/01/2019   Antimicrobials: none  DVT prophylaxis: Lovenox Code Status: Full Family Communication: none present Disposition Plan: Status is: Inpatient  Remains inpatient appropriate because:Unsafe d/c plan,  IV treatments appropriate due to intensity of illness or inability to take PO and Inpatient level of care appropriate due to severity of illness   Dispo: The patient is from: Home              Anticipated d/c is to: SNF              Anticipated d/c date is: 2 days              Patient currently is not medically stable to  d/c.  Objective: Blood pressure (!) 146/53, pulse 80, temperature 98.1 F (36.7 C), resp. rate 16, height 4\' 9"  (1.448 m), weight 71.7 kg, SpO2 92 %.  Examination: General appearance: alert, cooperative and no distress Head: Normocephalic, without obvious abnormality, atraumatic Eyes: EOMI Lungs: clear to auscultation bilaterally Heart: regular rate and rhythm and S1, S2 normal Abdomen: soft, NT, ND, BS present Extremities: left hip noted with post surgical dressings in place, mild TTP around, no oozing; compartments are soft Skin: mobility and turgor normal Neurologic: pain limiting strength in LLE however still has adequate strength on assessment, no paresthesias, otherwise no focal deficits   Consultants:   Cardiology  Ortho  Procedures:   IM nail fixation, 10/2  Data Reviewed: I have personally reviewed following labs and imaging studies Results for orders placed or performed during the hospital encounter of 12/30/19 (from the past 24 hour(s))  Basic metabolic panel     Status: Abnormal   Collection Time: 01/03/20  6:05 AM  Result Value Ref Range   Sodium 137 135 - 145 mmol/L   Potassium 5.2 (H) 3.5 - 5.1 mmol/L   Chloride 106 98 - 111 mmol/L   CO2 24 22 - 32 mmol/L   Glucose, Bld 88 70 - 99 mg/dL   BUN 58 (H) 8 - 23 mg/dL   Creatinine, Ser 1.77 (H) 0.44 - 1.00 mg/dL   Calcium 8.5 (L) 8.9 - 10.3 mg/dL   GFR calc non Af Amer 24 (L) >60 mL/min   GFR calc Af Amer 28 (L) >60 mL/min   Anion gap 7 5 - 15  CBC with Differential/Platelet     Status: Abnormal   Collection Time: 01/03/20  6:05 AM  Result Value Ref Range   WBC 7.3 4.0 - 10.5 K/uL   RBC 2.27 (L) 3.87 - 5.11 MIL/uL   Hemoglobin 7.1 (L) 12.0 - 15.0 g/dL   HCT 22.4 (L) 36 - 46 %   MCV 98.7 80.0 - 100.0 fL   MCH 31.3 26.0 - 34.0 pg   MCHC 31.7 30.0 - 36.0 g/dL   RDW 13.5 11.5 - 15.5 %   Platelets 184 150 - 400 K/uL   nRBC 0.0 0.0 - 0.2 %   Neutrophils Relative % 69 %   Neutro Abs 5.0 1.7 - 7.7 K/uL    Lymphocytes Relative 16 %   Lymphs Abs 1.2 0.7 - 4.0 K/uL   Monocytes Relative 13 %   Monocytes Absolute 0.9 0 - 1 K/uL   Eosinophils Relative 1 %   Eosinophils Absolute 0.1 0 - 0 K/uL   Basophils Relative 0 %   Basophils Absolute 0.0 0 - 0 K/uL   Immature Granulocytes 1 %   Abs Immature Granulocytes 0.05 0.00 - 0.07 K/uL  Magnesium     Status: Abnormal   Collection Time: 01/03/20  6:05 AM  Result Value Ref Range   Magnesium 2.6 (H) 1.7 - 2.4 mg/dL  Type and screen [facility]     Status: None (Preliminary result)   Collection Time: 01/03/20  9:29 AM  Result Value Ref Range   ABO/RH(D) B POS    Antibody Screen NEG    Sample Expiration 01/06/2020,2359    Unit Number [id number]    Blood Component Type RED CELLS,LR    Unit division 00    Status of Unit ISSUED    Transfusion Status OK TO TRANSFUSE    Crossmatch Result      Compatible Performed at [facility] [address], [place], [place] [address]   Prepare RBC (crossmatch)     Status: None   Collection Time: 01/03/20  9:29 AM  Result Value Ref Range   Order Confirmation      ORDER PROCESSED BY BLOOD BANK Performed at [facility], [place] [address] [place], [place] [address]     Recent Results (from the past 240 hour(s))  Respiratory Panel by RT PCR (Flu A&B, Covid) - Nasopharyngeal Swab     Status: None   Collection Time: 12/30/19  2:29 PM   Specimen: Nasopharyngeal Swab  Result Value Ref Range Status   SARS Coronavirus 2 by RT PCR NEGATIVE NEGATIVE Final    Comment: (NOTE) SARS-CoV-2 target nucleic acids are NOT DETECTED.  The SARS-CoV-2 RNA is generally detectable in upper respiratoy specimens during the acute phase of infection. The lowest concentration of SARS-CoV-2 viral copies this assay can detect is 131 copies/mL. A negative result does not preclude SARS-Cov-2 infection and should not be used as the sole basis for treatment or other patient  management decisions. A negative result may occur with  improper specimen collection/handling, submission of specimen other than nasopharyngeal swab, presence of viral mutation(s) within the areas targeted by this assay, and inadequate number of viral copies (<131 copies/mL). A negative result must be combined with clinical observations, patient history, and epidemiological information. The expected result is Negative.  Fact Sheet for Patients:  PinkCheek.be  Fact Sheet for Healthcare Providers:  GravelBags.it  This test is no t yet approved or cleared by the [place] FDA and  has been authorized for detection and/or diagnosis of SARS-CoV-2 by FDA under an Emergency Use Authorization (EUA). This EUA will remain  in effect (meaning this test can be used) for the duration of the COVID-19 declaration under Section 564(b)(1) of the Act, 21 U.S.C. section 360bbb-3(b)(1), unless the authorization is terminated or revoked sooner.     Influenza A by PCR NEGATIVE NEGATIVE Final   Influenza B by PCR NEGATIVE NEGATIVE Final    Comment: (NOTE) The Xpert Xpress SARS-CoV-2/FLU/RSV assay is intended as an aid in  the diagnosis of influenza from Nasopharyngeal swab specimens and  should not be used as a sole basis for treatment. Nasal washings and  aspirates are unacceptable for Xpert Xpress SARS-CoV-2/FLU/RSV  testing.  Fact Sheet for Patients: PinkCheek.be  Fact Sheet for Healthcare Providers: GravelBags.it  This test is not yet approved or cleared by the [place] FDA and  has been authorized for detection and/or diagnosis of SARS-CoV-2 by  FDA under an Emergency Use Authorization (EUA). This EUA will remain  in effect (meaning this test can be used) for the duration of the  Covid-19 declaration under Section 564(b)(1) of the Act, 21  U.S.C. section 360bbb-3(b)(1),  unless the authorization is  terminated or revoked. Performed at [facility], [place] [address] [place],  [address]   Surgical PCR screen     Status: None  Collection Time: 01/01/20  5:05 AM   Specimen: Nasal Mucosa; Nasal Swab  Result Value Ref Range Status   MRSA, PCR NEGATIVE NEGATIVE Final   Staphylococcus aureus NEGATIVE NEGATIVE Final    Comment: (NOTE) The Xpert SA Assay (FDA approved for NASAL specimens in patients 83 years of age and older), is one component of a comprehensive surveillance program. It is not intended to diagnose infection nor to guide or monitor treatment. Performed at [facility], [place] [address], [place], [place] [address]      Radiology Studies: No results found. Pelvis Portable  Final Result    DG C-Arm 1-60 Min-No Report  Final Result    DG HIP OPERATIVE UNILAT WITH PELVIS LEFT  Final Result    DG Pelvis 1-2 Views  Final Result    DG Femur Min 2 Views Left  Final Result    DG Chest Portable 1 View  Final Result    DG Hip Unilat W or Wo Pelvis 2-3 Views Left  Final Result      Scheduled Meds: . sodium chloride   Intravenous Once  . acetaminophen  1,000 mg Oral QID  . amLODipine  7.5 mg Oral Daily  . calcium-vitamin D  1 tablet Oral Q breakfast  . carvedilol  6.25 mg Oral BID WC  . Chlorhexidine Gluconate Cloth  6 each Topical Daily  . docusate sodium  100 mg Oral BID  . enoxaparin (LOVENOX) injection  30 mg Subcutaneous Q24H  . ezetimibe  10 mg Oral Daily  . feeding supplement (ENSURE ENLIVE)  237 mL Oral BID BM  . isosorbide mononitrate  60 mg Oral Daily  . latanoprost  1 drop Both Eyes QHS  . losartan  25 mg Oral Daily  . multivitamin with minerals  1 tablet Oral Daily  . mupirocin ointment  1 application Nasal BID  . polyethylene glycol  17 g Oral Daily  . senna-docusate  1 tablet Oral BID   PRN Meds: HYDROmorphone (DILAUDID) injection, menthol-cetylpyridinium **OR** phenol,  methocarbamol **OR** [DISCONTINUED] methocarbamol (ROBAXIN) IV, metoCLOPramide **OR** [DISCONTINUED] metoCLOPramide (REGLAN) injection, naphazoline-glycerin, ondansetron **OR** [DISCONTINUED] ondansetron (ZOFRAN) IV, oxyCODONE, prochlorperazine Continuous Infusions:     LOS: 4 days  Time spent: Greater than 50% of the 35 minute visit was spent in counseling/coordination of care for the patient as laid out in the A&P.   Dwyane Dee, MD [facility] 01/03/2020, 3:06 PM  Contact via secure chat.  To contact the attending provider between 7A-7P or the covering provider during after hours 7P-7A, please log into the web site [email or web address] and access using universal [place] password for that web site. If you do not have the password, please call the hospital operator.

## 2020-01-03 NOTE — TOC Initial Note (Signed)
Transition of Care [facility]) - Initial/Assessment Note    Patient Details  Name: Sara Garza MRN: [id number] Date of Birth: 1923-11-25  Transition [facility] Care [facility]) CM/SW Contact:    Lynnell Catalan, RN Phone Number: 01/03/2020, 3:45 PM  Clinical Narrative:                 CSW faxed out FL2 yesterday to limited facilities. No bed offers as of this morning. This CM faxed pt out to a wider range of facilities. TOC will continue to follow for SNF bed offers.  Expected Discharge Plan: [place]     Expected Discharge Plan and Services Expected Discharge Plan: [place]       Activities of Daily Living Home Assistive Devices/Equipment: Eyeglasses, Kasandra Knudsen (specify quad or straight), Walker (specify type), Hearing aid (reading glasses, bilateral hearing aides) ADL Screening (condition at time of admission) Patient's cognitive ability adequate to safely complete daily activities?: Yes Is the patient deaf or have difficulty hearing?: Yes Does the patient have difficulty seeing, even when wearing glasses/contacts?: Yes (has bilateral glaucoma and bilateral macular degeneration) Does the patient have difficulty concentrating, remembering, or making decisions?: No Patient able to express need for assistance with ADLs?: Yes Does the patient have difficulty dressing or bathing?: Yes Independently performs ADLs?: No Communication: Independent Dressing (OT): Needs assistance Is this a change from baseline?: Change from baseline, expected to last >3 days Grooming: Independent Feeding: Independent Bathing: Needs assistance Is this a change from baseline?: Change from baseline, expected to last >3 days Toileting: Needs assistance Is this a change from baseline?: Change from baseline, expected to last >3days In/Out Bed: Needs assistance Is this a change from baseline?: Change from baseline, expected to last >3 days Walks in Home: Needs assistance Is this a change from  baseline?: Change from baseline, expected to last >3 days Does the patient have difficulty walking or climbing stairs?: Yes Weakness of Legs: Left Weakness of Arms/Hands: None  Permission Sought/Granted                  Emotional Assessment              Admission diagnosis:  Hip fracture ([place]) [S72.009A] Closed fracture of femur, intertrochanteric, left, initial encounter [facility]) [E83.151V] Patient Active Problem List   Diagnosis Date Noted  . Acute blood loss anemia 01/03/2020  . Eye irritation 01/01/2020  . Mitral valve stenosis   . Benign essential HTN   . Chronic diastolic CHF (congestive heart failure) ([place])   . Hip fracture ([place]) 12/30/2019  . Closed fracture of femur, intertrochanteric, left, initial encounter ([place])   . Acute on chronic diastolic CHF (congestive heart failure) ([place]) 02/25/2018  . S/P [name] (transcatheter aortic valve replacement) 02/25/2018  . Leg pain   . Hyperlipidemia   . Glaucoma   . Carotid stenosis, left   . Arthritis   . Urinary incontinence 02/12/2017  . Chronic bilateral low back pain 08/12/2016  . Spondylosis of lumbar joint 08/12/2016  . Bilateral leg pain 08/12/2016  . Neck mass 04/15/2015  . Status post hip hemiarthroplasty 07/31/2013  . CAD (coronary artery disease) 07/31/2013  . CKD (chronic kidney disease), stage III ([place]) 07/31/2013  . Left bundle branch block 07/31/2013  . Severe calcific aortic stenosis 06/18/2010  . Diabetic polyneuropathy ([place]) 04/16/2010  . GLAUCOMA 01/06/2009  . CHEST PAIN, ATYPICAL 12/16/2008  . Bilateral shoulder pain 11/16/2007  . OSTEOARTHRITIS 02/02/2007  . Type 2 diabetes mellitus with renal manifestations, controlled (  [place]) 10/24/2006  . Dyslipidemia 10/02/2006  . Essential hypertension 10/02/2006   PCP:  [name], NP Pharmacy:   [facility] [place], [place] - [address] AT [place] [address] [place] [address] Phone: [phone]  Fax: [place], [place] [address] AT [facility] [address] [place] [address] Phone: [phone] Fax: [phone]     Social Determinants of Health (SDOH) Interventions    Readmission Risk Interventions No flowsheet data found.

## 2020-01-03 NOTE — Anesthesia Postprocedure Evaluation (Signed)
Anesthesia Post Note  Patient: Sara Garza  Procedure(s) Performed: INTRAMEDULLARY (IM) NAIL INTERTROCHANTRIC (Left Hip)     Patient location during evaluation: PACU Anesthesia Type: MAC and Spinal Level of consciousness: oriented and awake and alert Pain management: pain level controlled Vital Signs Assessment: post-procedure vital signs reviewed and stable Respiratory status: spontaneous breathing, respiratory function stable and patient connected to nasal cannula oxygen Cardiovascular status: blood pressure returned to baseline and stable Postop Assessment: no headache, no backache and no apparent nausea or vomiting Anesthetic complications: no   No complications documented.  Last Vitals:  Vitals:   01/02/20 2139 01/03/20 0701  BP: (!) 112/49 (!) 153/60  Pulse: 81 89  Resp: 17   Temp: 36.4 C 36.4 C  SpO2: 91% (!) 86%    Last Pain:  Vitals:   01/03/20 0800  TempSrc:   PainSc: 0-No pain                 Debbra Digiulio S

## 2020-01-03 NOTE — Progress Notes (Signed)
    Subjective:  Patient reports pain as moderate to severe.  Denies N/V/CP/SOB. C/o left hip pain  Objective:   VITALS:   Vitals:   01/02/20 0514 01/02/20 1426 01/02/20 2139 01/03/20 0701  BP: (!) 148/50 (!) 117/48 (!) 112/49 (!) 153/60  Pulse: 72 72 81 89  Resp:  12 17   Temp: (!) 97.5 F (36.4 C) 98.1 F (36.7 C) 97.6 F (36.4 C) 97.6 F (36.4 C)  TempSrc: Oral Oral Oral Oral  SpO2: 94% 90% 91% (!) 86%  Weight:      Height:        NAD ABD soft Sensation intact distally Intact pulses distally Dorsiflexion/Plantar flexion intact Incision: scant drainage Compartment soft (+) calf TTP - states this is baseline   Lab Results  Component Value Date   WBC 7.3 01/03/2020   HGB 7.1 (L) 01/03/2020   HCT 22.4 (L) 01/03/2020   MCV 98.7 01/03/2020   PLT 184 01/03/2020   BMET    Component Value Date/Time   NA 137 01/03/2020 0605   NA 146 (H) 05/03/2019 1159   K 5.2 (H) 01/03/2020 0605   CL 106 01/03/2020 0605   CO2 24 01/03/2020 0605   GLUCOSE 88 01/03/2020 0605   BUN 58 (H) 01/03/2020 0605   BUN 33 05/03/2019 1159   CREATININE 1.77 (H) 01/03/2020 0605   CREATININE 1.44 (H) 12/01/2019 1014   CALCIUM 8.5 (L) 01/03/2020 0605   GFRNONAA 24 (L) 01/03/2020 0605   GFRAA 28 (L) 01/03/2020 0605     Assessment/Plan: 2 Days Post-Op   Active Problems:   Type 2 diabetes mellitus with renal manifestations, controlled (HCC)   CKD (chronic kidney disease), stage III (HCC)   Acute on chronic diastolic CHF (congestive heart failure) (HCC)   Closed fracture of femur, intertrochanteric, left, initial encounter (HCC)   Mitral valve stenosis   Benign essential HTN   Eye irritation   WBAT with walker DVT ppx: Lovenox in [place] --> home on ASA, SCDs, TEDS PO pain control PT/OT Dispo: d/c when medically ready   Sara Garza 01/03/2020, 11:48 AM   Rod Can, MD [phone] [facility] is now [facility]  [place] [address]  [address], [place], [place] [address] Phone: [phone] [email or web address] Facebook  [employer]

## 2020-01-03 NOTE — Assessment & Plan Note (Addendum)
-  Patient's baseline is around 11 g/dL.  She has drifted down to 7.1 g/dL on 10/4, and semisymptomatic, she also has underlying heart disease.  In this setting we will go ahead and transfuse 1 unit PRBC on 01/03/2020 -Give Lasix 20 mg PO after blood transfusion - Hgb improved to 9.7 g/dL on 10/5

## 2020-01-03 NOTE — Care Management Important Message (Signed)
Important Message  Patient Details IM Letter given to the Patient Name: Sara Garza MRN: [id number] Date of Birth: 14-May-1923   Medicare Important Message Given:  Yes     Kerin Salen 01/03/2020, 12:17 PM

## 2020-01-04 ENCOUNTER — Encounter (HOSPITAL_COMMUNITY): Payer: Self-pay | Admitting: Orthopedic Surgery

## 2020-01-04 DIAGNOSIS — I447 Left bundle-branch block, unspecified: Secondary | ICD-10-CM | POA: Diagnosis not present

## 2020-01-04 DIAGNOSIS — M2550 Pain in unspecified joint: Secondary | ICD-10-CM | POA: Diagnosis not present

## 2020-01-04 DIAGNOSIS — W19XXXA Unspecified fall, initial encounter: Secondary | ICD-10-CM | POA: Diagnosis not present

## 2020-01-04 DIAGNOSIS — R278 Other lack of coordination: Secondary | ICD-10-CM | POA: Diagnosis not present

## 2020-01-04 DIAGNOSIS — D62 Acute posthemorrhagic anemia: Secondary | ICD-10-CM | POA: Diagnosis not present

## 2020-01-04 DIAGNOSIS — N183 Chronic kidney disease, stage 3 unspecified: Secondary | ICD-10-CM | POA: Diagnosis not present

## 2020-01-04 DIAGNOSIS — R41841 Cognitive communication deficit: Secondary | ICD-10-CM | POA: Diagnosis not present

## 2020-01-04 DIAGNOSIS — E1129 Type 2 diabetes mellitus with other diabetic kidney complication: Secondary | ICD-10-CM | POA: Diagnosis not present

## 2020-01-04 DIAGNOSIS — E785 Hyperlipidemia, unspecified: Secondary | ICD-10-CM | POA: Diagnosis not present

## 2020-01-04 DIAGNOSIS — I10 Essential (primary) hypertension: Secondary | ICD-10-CM | POA: Diagnosis not present

## 2020-01-04 DIAGNOSIS — M25552 Pain in left hip: Secondary | ICD-10-CM | POA: Diagnosis not present

## 2020-01-04 DIAGNOSIS — I5033 Acute on chronic diastolic (congestive) heart failure: Secondary | ICD-10-CM | POA: Diagnosis not present

## 2020-01-04 DIAGNOSIS — Z4889 Encounter for other specified surgical aftercare: Secondary | ICD-10-CM | POA: Diagnosis not present

## 2020-01-04 DIAGNOSIS — Z4789 Encounter for other orthopedic aftercare: Secondary | ICD-10-CM | POA: Diagnosis not present

## 2020-01-04 DIAGNOSIS — R301 Vesical tenesmus: Secondary | ICD-10-CM | POA: Diagnosis not present

## 2020-01-04 DIAGNOSIS — R2681 Unsteadiness on feet: Secondary | ICD-10-CM | POA: Diagnosis not present

## 2020-01-04 DIAGNOSIS — S72142A Displaced intertrochanteric fracture of left femur, initial encounter for closed fracture: Secondary | ICD-10-CM | POA: Diagnosis not present

## 2020-01-04 DIAGNOSIS — I2510 Atherosclerotic heart disease of native coronary artery without angina pectoris: Secondary | ICD-10-CM | POA: Diagnosis not present

## 2020-01-04 DIAGNOSIS — S79929A Unspecified injury of unspecified thigh, initial encounter: Secondary | ICD-10-CM | POA: Diagnosis not present

## 2020-01-04 DIAGNOSIS — S72142D Displaced intertrochanteric fracture of left femur, subsequent encounter for closed fracture with routine healing: Secondary | ICD-10-CM | POA: Diagnosis not present

## 2020-01-04 DIAGNOSIS — I350 Nonrheumatic aortic (valve) stenosis: Secondary | ICD-10-CM | POA: Diagnosis not present

## 2020-01-04 DIAGNOSIS — R5381 Other malaise: Secondary | ICD-10-CM | POA: Diagnosis not present

## 2020-01-04 DIAGNOSIS — M6281 Muscle weakness (generalized): Secondary | ICD-10-CM | POA: Diagnosis not present

## 2020-01-04 DIAGNOSIS — E1142 Type 2 diabetes mellitus with diabetic polyneuropathy: Secondary | ICD-10-CM | POA: Diagnosis not present

## 2020-01-04 DIAGNOSIS — Z7401 Bed confinement status: Secondary | ICD-10-CM | POA: Diagnosis not present

## 2020-01-04 DIAGNOSIS — W19XXXD Unspecified fall, subsequent encounter: Secondary | ICD-10-CM | POA: Diagnosis not present

## 2020-01-04 DIAGNOSIS — I050 Rheumatic mitral stenosis: Secondary | ICD-10-CM | POA: Diagnosis not present

## 2020-01-04 DIAGNOSIS — S72145D Nondisplaced intertrochanteric fracture of left femur, subsequent encounter for closed fracture with routine healing: Secondary | ICD-10-CM | POA: Diagnosis not present

## 2020-01-04 DIAGNOSIS — K5900 Constipation, unspecified: Secondary | ICD-10-CM | POA: Diagnosis not present

## 2020-01-04 DIAGNOSIS — Z7901 Long term (current) use of anticoagulants: Secondary | ICD-10-CM | POA: Diagnosis not present

## 2020-01-04 DIAGNOSIS — Z9181 History of falling: Secondary | ICD-10-CM | POA: Diagnosis not present

## 2020-01-04 DIAGNOSIS — S7292XA Unspecified fracture of left femur, initial encounter for closed fracture: Secondary | ICD-10-CM | POA: Diagnosis not present

## 2020-01-04 DIAGNOSIS — H5789 Other specified disorders of eye and adnexa: Secondary | ICD-10-CM | POA: Diagnosis not present

## 2020-01-04 LAB — CBC WITH DIFFERENTIAL/PLATELET
Abs Immature Granulocytes: 0.06 10*3/uL (ref 0.00–0.07)
Basophils Absolute: 0 10*3/uL (ref 0.0–0.1)
Basophils Relative: 0 %
Eosinophils Absolute: 0.3 10*3/uL (ref 0.0–0.5)
Eosinophils Relative: 5 %
HCT: 29.5 % — ABNORMAL LOW (ref 36.0–46.0)
Hemoglobin: 9.7 g/dL — ABNORMAL LOW (ref 12.0–15.0)
Immature Granulocytes: 1 %
Lymphocytes Relative: 20 %
Lymphs Abs: 1.5 10*3/uL (ref 0.7–4.0)
MCH: 31.3 pg (ref 26.0–34.0)
MCHC: 32.9 g/dL (ref 30.0–36.0)
MCV: 95.2 fL (ref 80.0–100.0)
Monocytes Absolute: 0.8 10*3/uL (ref 0.1–1.0)
Monocytes Relative: 10 %
Neutro Abs: 4.7 10*3/uL (ref 1.7–7.7)
Neutrophils Relative %: 64 %
Platelets: 176 10*3/uL (ref 150–400)
RBC: 3.1 MIL/uL — ABNORMAL LOW (ref 3.87–5.11)
RDW: 14.9 % (ref 11.5–15.5)
WBC: 7.4 10*3/uL (ref 4.0–10.5)
nRBC: 0 % (ref 0.0–0.2)

## 2020-01-04 LAB — BASIC METABOLIC PANEL
Anion gap: 9 (ref 5–15)
BUN: 60 mg/dL — ABNORMAL HIGH (ref 8–23)
CO2: 23 mmol/L (ref 22–32)
Calcium: 8.5 mg/dL — ABNORMAL LOW (ref 8.9–10.3)
Chloride: 105 mmol/L (ref 98–111)
Creatinine, Ser: 1.47 mg/dL — ABNORMAL HIGH (ref 0.44–1.00)
GFR calc Af Amer: 35 mL/min — ABNORMAL LOW (ref >60)
GFR calc non Af Amer: 30 mL/min — ABNORMAL LOW (ref >60)
Glucose, Bld: 88 mg/dL (ref 70–99)
Potassium: 5.5 mmol/L — ABNORMAL HIGH (ref 3.5–5.1)
Sodium: 137 mmol/L (ref 135–145)

## 2020-01-04 LAB — BPAM RBC
Blood Product Expiration Date: 202110222359
ISSUE DATE / TIME: 202110041402
Unit Type and Rh: 7300

## 2020-01-04 LAB — TYPE AND SCREEN
ABO/RH(D): B POS
Antibody Screen: NEGATIVE
Unit division: 0

## 2020-01-04 LAB — MAGNESIUM: Magnesium: 2.7 mg/dL — ABNORMAL HIGH (ref 1.7–2.4)

## 2020-01-04 LAB — SARS CORONAVIRUS 2 BY RT PCR (HOSPITAL ORDER, PERFORMED IN ~~LOC~~ HOSPITAL LAB): SARS Coronavirus 2: NEGATIVE

## 2020-01-04 MED ORDER — ACETAMINOPHEN 500 MG PO TABS
1000.0000 mg | ORAL_TABLET | Freq: Four times a day (QID) | ORAL | 0 refills | Status: DC
Start: 2020-01-04 — End: 2023-03-02

## 2020-01-04 MED ORDER — TRAMADOL HCL 50 MG PO TABS
50.0000 mg | ORAL_TABLET | Freq: Two times a day (BID) | ORAL | Status: DC | PRN
  Administered 2020-01-04: 50 mg via ORAL
  Filled 2020-01-04 (×2): qty 1

## 2020-01-04 MED ORDER — TRAMADOL HCL 50 MG PO TABS: 50.0000 mg | ORAL_TABLET | Freq: Four times a day (QID) | ORAL | Status: DC | PRN

## 2020-01-04 MED ORDER — CARVEDILOL 6.25 MG PO TABS: 6.2500 mg | ORAL_TABLET | Freq: Two times a day (BID) | ORAL | Status: DC

## 2020-01-04 MED ORDER — TRAMADOL HCL 50 MG PO TABS
50.0000 mg | ORAL_TABLET | Freq: Two times a day (BID) | ORAL | 0 refills | Status: AC | PRN
Start: 2020-01-04 — End: 2020-01-09

## 2020-01-04 NOTE — TOC Transition Note (Signed)
Transition of Care [facility]) - CM/SW Discharge Note   Patient Details  Name: Sara Garza MRN: [id number] Date of Birth: 03-Apr-1923  Transition of Care [facility]) CM/SW Contact:  Lynnell Catalan, RN Phone Number: 01/04/2020, 1:57 PM    Clinical Narrative:    This CM gave SNF bed offers to both pt and daughter Kaylen Motl at bedside. [facility] was chosen. Rapid Covid test came back negative. DC summary faxed via hub. Pt to transport via hub and will go to room 208. RN to call report to [phone].    Final next level of care: [place]   Discharge Placement              Patient chooses bed at: [place] Patient to be transferred to facility by: [place] Name of family member notified: daughter Patient and family notified of of transfer: 01/04/20  Readmission Risk Interventions No flowsheet data found.

## 2020-01-04 NOTE — Discharge Summary (Signed)
Physician Discharge Summary  Sara Garza [facility] JJH:[id number] DOB: 16-Jan-1924 DOA: 12/30/2019  PCP: [name], NP  Admit date: 12/30/2019 Discharge date: 01/04/2020  Admitted From: home Disposition:  [facility] Discharging physician: Dwyane Dee, MD  Recommendations for Outpatient Follow-up:  1. Follow up with ortho 2. Change tylenol to PRN once pain better controlled 3. Continue asa 81 mg BID x 6 weeks at discharge   Patient discharged to [facility] in Discharge Condition: stable CODE STATUS: Full Diet recommendation:  Diet Orders (From admission, onward)    Start     Ordered   01/04/20 0000  Diet general        01/04/20 1306   01/01/20 1154  Diet regular Room service appropriate? Yes; Fluid consistency: Thin  Diet effective now       Question Answer Comment  Room service appropriate? Yes   Fluid consistency: Thin      01/01/20 1153          Hospital Course: Sara Garza is a 86 y.o. female with medical history significant of chronic diastolic CHF, aortic stenosis status post [name] in 2014, hypertension, CKD stage III who presented with mechanical fall.   Patient tripped and fell on her left hip while walking to a dumpster.  She denied any prodromes of lightheaded or blurred vision before she fell down and denied any chest pain or shortness of breath.     She follows with cardiology for her CHF and [name], most recent echo in July 2021 showed EF 65 to 70% with asymmetrical wall thickening and diastolic dysfunction.   In the ER she underwent multiple xrays which showed "Acute comminuted left femoral ntertrochanteric fracture. No left hip dislocation."  She was evaluated by orthopedic surgery with tentative plans for repair on 01/01/2020. Cardiology was also consulted for further cardiac clearance prior to surgery.  She underwent IM nail fixation of left hip fx on 01/01/2020 and tolerated well.  PT evaluated patient on 10/3 and recommends SNF for ongoing rehab.   Her Hgb  downtrended some after surgery to 7.1g/dL and on 10/4 she underwent 1 unit PRBC transfusion. Hgb improved to 9.7 g/dL the following morning.  She accepted a bed offer at [facility] and was discharged in stable condition.  Regarding ongoing DVT ppx she will be on asa 81 mg BID x 6 weeks and will need to follow up with ortho in 2 weeks after discharge.    * Closed fracture of femur, intertrochanteric, left, initial encounter ([place]) - s/p mechanical fall at home - s/p IM nail fixation with ortho on 10/2, patient tolerated surgery well -PT recommending SNF for ongoing rehab at discharge.  Daughter stated that they did not have adequate help or care at home and also would be considering rehab at discharge - ortho recs are WBAT with walker - Lovenox while hospitalized then asa 81 mg BID x 6 weeks - continue pain control  Acute on chronic diastolic CHF (congestive heart failure) ([place]) - appreciate cardiology evaluation and assistance with clearance - caution with volume status; she is noted to have chronic LE edema and DOE - PRN lasix per cardiology  - see cardiology note; overall stable for surgery from cardiac standpoint  CKD (chronic kidney disease), stage III (HCC) -Baseline creatinine 1.3-1.4 -Currently at baseline on admission  Acute blood loss anemia -Patient's baseline is around 11 g/dL.  She has drifted down to 7.1 g/dL on 10/4, and semisymptomatic, she also has underlying heart disease.  In this setting  we will go ahead and transfuse 1 unit PRBC on 01/03/2020 -Give Lasix 20 mg PO after blood transfusion - Hgb improved to 9.7 g/dL on 10/5  Eye irritation - left eye periorbital irritation; no obvious conjunctivitis - place cool cloth over eye PRN - visine PRN at least 4 x daily  - continue glaucoma drops as well  Benign essential HTN - continue current regimen; BP stable - parameters placed on meds in case of hypoTN  Type 2 diabetes mellitus with renal  manifestations, controlled ([place]) -Well-controlled with diet -Last A1c 5.7% on 12/01/2019   Discharge Diagnoses:   Principal Diagnosis: Closed fracture of femur, intertrochanteric, left, initial encounter [facility])  Active Hospital Problems   Diagnosis Date Noted  . Closed fracture of femur, intertrochanteric, left, initial encounter [facility])     Priority: High  . Acute on chronic diastolic CHF (congestive heart failure) ([place]) 02/25/2018    Priority: Medium  . CKD (chronic kidney disease), stage III ([place]) 07/31/2013    Priority: Low  . Acute blood loss anemia 01/03/2020  . Eye irritation 01/01/2020  . Mitral valve stenosis   . Benign essential HTN   . Type 2 diabetes mellitus with renal manifestations, controlled ([place]) 10/24/2006    Resolved Hospital Problems  No resolved problems to display.    Discharge Instructions    Diet general   Complete by: As directed    Discharge wound care:   Complete by: As directed    Keep dressing in place; follow up with ortho in 10-14 days   Increase activity slowly   Complete by: As directed      Allergies as of 01/04/2020      Reactions   Statins Other (See Comments)   Muscle aches   Gabapentin Other (See Comments)   SOMNOLENCE      Medication List    STOP taking these medications   amoxicillin 500 MG capsule Commonly known as: AMOXIL   aspirin EC 81 MG tablet Replaced by: aspirin 81 MG chewable tablet     TAKE these medications   acetaminophen 500 MG tablet Commonly known as: TYLENOL Take 2 tablets (1,000 mg total) by mouth 4 (four) times daily. What changed: when to take this   amLODipine 5 MG tablet Commonly known as: NORVASC TAKE 1 AND 1/2 TABLETS(7.5 MG) BY MOUTH DAILY What changed: See the new instructions.   aspirin 81 MG chewable tablet Commonly known as: Aspirin Childrens Chew 1 tablet (81 mg total) by mouth 2 (two) times daily with a meal. Replaces: aspirin EC 81 MG tablet   Calcium 500 +D 500-400 MG-UNIT  Tabs Generic drug: Calcium Carb-Cholecalciferol Take 1 tablet by mouth daily.   carvedilol 6.25 MG tablet Commonly known as: COREG Take 1 tablet (6.25 mg total) by mouth 2 (two) times daily with a meal.   ezetimibe 10 MG tablet Commonly known as: ZETIA TAKE 1 TABLET(10 MG) BY MOUTH DAILY What changed: See the new instructions.   furosemide 20 MG tablet Commonly known as: LASIX Take 1 tablet (20 mg total) by mouth daily. What changed:   how much to take  when to take this  reasons to take this   isosorbide mononitrate 60 MG 24 hr tablet Commonly known as: IMDUR TAKE 1 TABLET(60 MG) BY MOUTH DAILY What changed: See the new instructions.   losartan 25 MG tablet Commonly known as: COZAAR TAKE 1 TABLET(25 MG) BY MOUTH DAILY What changed: See the new instructions.   Lumigan 0.01 % Soln Generic drug:  bimatoprost Place 1 drop into both eyes at bedtime.   multivitamin tablet Take 1 tablet by mouth daily.   oxybutynin 5 MG 24 hr tablet Commonly known as: DITROPAN-XL TAKE 1 TABLET(5 MG) BY MOUTH AT BEDTIME What changed: See the new instructions.   PRESERVISION AREDS 2+MULTI VIT PO Take 2 capsules by mouth in the morning and at bedtime.   traMADol 50 MG tablet Commonly known as: ULTRAM Take 1 tablet (50 mg total) by mouth every 12 (twelve) hours as needed for up to 5 days for severe pain.            Discharge Care Instructions  (From admission, onward)         Start     Ordered   01/04/20 0000  Discharge wound care:       Comments: Keep dressing in place; follow up with ortho in 10-14 days   01/04/20 1306          Follow-up Information    Swinteck, Aaron Edelman, MD. Schedule an appointment as soon as possible for a visit in 2 weeks.   Specialty: Orthopedic Surgery Why: For wound re-check, For suture removal Contact information: [address] [place] [address] [phone]              Allergies  Allergen Reactions  . Statins  Other (See Comments)    Muscle aches  . Gabapentin Other (See Comments)    SOMNOLENCE    Consultations: Ortho Cardiology  Discharge Exam: BP (!) 151/78   Pulse 72   Temp 98 F (36.7 C) (Oral)   Resp 20   Ht 4\' 9"  (1.448 m)   Wt 71.7 kg   SpO2 94%   BMI 34.21 kg/m  General appearance: alert, cooperative and no distress Head: Normocephalic, without obvious abnormality, atraumatic Eyes: EOMI Lungs: clear to auscultation bilaterally Heart: regular rate and rhythm and S1, S2 normal Abdomen: soft, NT, ND, BS present Extremities: left hip noted with post surgical dressings in place, mild TTP around, no oozing; compartments are soft Skin: mobility and turgor normal Neurologic: pain limiting strength in LLE however still has adequate strength on assessment, no paresthesias, otherwise no focal deficits   The results of significant diagnostics from this hospitalization (including imaging, microbiology, ancillary and laboratory) are listed below for reference.   Microbiology: Recent Results (from the past 240 hour(s))  Respiratory Panel by RT PCR (Flu A&B, Covid) - Nasopharyngeal Swab     Status: None   Collection Time: 12/30/19  2:29 PM   Specimen: Nasopharyngeal Swab  Result Value Ref Range Status   SARS Coronavirus 2 by RT PCR NEGATIVE NEGATIVE Final    Comment: (NOTE) SARS-CoV-2 target nucleic acids are NOT DETECTED.  The SARS-CoV-2 RNA is generally detectable in upper respiratoy specimens during the acute phase of infection. The lowest concentration of SARS-CoV-2 viral copies this assay can detect is 131 copies/mL. A negative result does not preclude SARS-Cov-2 infection and should not be used as the sole basis for treatment or other patient management decisions. A negative result may occur with  improper specimen collection/handling, submission of specimen other than nasopharyngeal swab, presence of viral mutation(s) within the areas targeted by this assay, and inadequate  number of viral copies (<131 copies/mL). A negative result must be combined with clinical observations, patient history, and epidemiological information. The expected result is Negative.  Fact Sheet for Patients:  PinkCheek.be  Fact Sheet for Healthcare Providers:  GravelBags.it  This test is no t yet  approved or cleared by the [place] and  has been authorized for detection and/or diagnosis of SARS-CoV-2 by FDA under an Emergency Use Authorization (EUA). This EUA will remain  in effect (meaning this test can be used) for the duration of the COVID-19 declaration under Section 564(b)(1) of the Act, 21 U.S.C. section 360bbb-3(b)(1), unless the authorization is terminated or revoked sooner.     Influenza A by PCR NEGATIVE NEGATIVE Final   Influenza B by PCR NEGATIVE NEGATIVE Final    Comment: (NOTE) The Xpert Xpress SARS-CoV-2/FLU/RSV assay is intended as an aid in  the diagnosis of influenza from Nasopharyngeal swab specimens and  should not be used as a sole basis for treatment. Nasal washings and  aspirates are unacceptable for Xpert Xpress SARS-CoV-2/FLU/RSV  testing.  Fact Sheet for Patients: PinkCheek.be  Fact Sheet for Healthcare Providers: GravelBags.it  This test is not yet approved or cleared by the [place] FDA and  has been authorized for detection and/or diagnosis of SARS-CoV-2 by  FDA under an Emergency Use Authorization (EUA). This EUA will remain  in effect (meaning this test can be used) for the duration of the  Covid-19 declaration under Section 564(b)(1) of the Act, 21  U.S.C. section 360bbb-3(b)(1), unless the authorization is  terminated or revoked. Performed at [facility], [place] [address] [place], [place] [address]   Surgical PCR screen     Status: None   Collection Time: 01/01/20  5:05 AM   Specimen:  Nasal Mucosa; Nasal Swab  Result Value Ref Range Status   MRSA, PCR NEGATIVE NEGATIVE Final   Staphylococcus aureus NEGATIVE NEGATIVE Final    Comment: (NOTE) The Xpert SA Assay (FDA approved for NASAL specimens in patients 47 years of age and older), is one component of a comprehensive surveillance program. It is not intended to diagnose infection nor to guide or monitor treatment. Performed at [facility], [place] [address] [place], [place] [address]   SARS Coronavirus 2 by RT PCR (hospital order, performed in [facility] hospital lab) Nasopharyngeal Nasopharyngeal Swab     Status: None   Collection Time: 01/04/20 11:16 AM   Specimen: Nasopharyngeal Swab  Result Value Ref Range Status   SARS Coronavirus 2 NEGATIVE NEGATIVE Final    Comment: (NOTE) SARS-CoV-2 target nucleic acids are NOT DETECTED.  The SARS-CoV-2 RNA is generally detectable in upper and lower respiratory specimens during the acute phase of infection. The lowest concentration of SARS-CoV-2 viral copies this assay can detect is 250 copies / mL. A negative result does not preclude SARS-CoV-2 infection and should not be used as the sole basis for treatment or other patient management decisions.  A negative result may occur with improper specimen collection / handling, submission of specimen other than nasopharyngeal swab, presence of viral mutation(s) within the areas targeted by this assay, and inadequate number of viral copies (<250 copies / mL). A negative result must be combined with clinical observations, patient history, and epidemiological information.  Fact Sheet for Patients:   StrictlyIdeas.no  Fact Sheet for Healthcare Providers: BankingDealers.co.za  This test is not yet approved or  cleared by the [place] FDA and has been authorized for detection and/or diagnosis of SARS-CoV-2 by FDA under an Emergency Use Authorization (EUA).   This EUA will remain in effect (meaning this test can be used) for the duration of the COVID-19 declaration under Section 564(b)(1) of the Act, 21 U.S.C. section 360bbb-3(b)(1), unless the authorization is terminated or revoked  sooner.  Performed at [facility], [name] [address] [place], [place] [address]      Labs: BNP (last 3 results) Recent Labs    12/31/19 0554  BNP 967.8*   Basic Metabolic Panel: Recent Labs  Lab 12/31/19 0554 01/01/20 0629 01/02/20 0650 01/03/20 0605 01/04/20 0541  NA 140 137 134* 137 137  K 5.2* 5.1 5.3* 5.2* 5.5*  CL 106 104 103 106 105  CO2 24 24 21* 24 23  GLUCOSE 135* 130* 127* 88 88  BUN 37* 41* 41* 58* 60*  CREATININE 1.45* 1.56* 1.46* 1.77* 1.47*  CALCIUM 8.9 9.4 8.8* 8.5* 8.5*  MG  --  2.6* 2.3 2.6* 2.7*   Liver Function Tests: No results for input(s): AST, ALT, ALKPHOS, BILITOT, PROT, ALBUMIN in the last 168 hours. No results for input(s): LIPASE, AMYLASE in the last 168 hours. No results for input(s): AMMONIA in the last 168 hours. CBC: Recent Labs  Lab 12/30/19 1429 12/30/19 1429 12/31/19 0554 01/01/20 0629 01/02/20 0841 01/03/20 0605 01/04/20 0541  WBC 5.3   < > 6.5 7.5 7.3 7.3 7.4  NEUTROABS 3.6  --   --  5.9 5.6 5.0 4.7  HGB 10.2*   < > 9.3* 9.0* 7.9* 7.1* 9.7*  HCT 31.4*   < > 28.5* 28.4* 23.9* 22.4* 29.5*  MCV 94.9   < > 96.9 99.0 93.0 98.7 95.2  PLT 178   < > 176 192 153 184 176   < > = values in this interval not displayed.   Cardiac Enzymes: No results for input(s): CKTOTAL, CKMB, CKMBINDEX, TROPONINI in the last 168 hours. BNP: Invalid input(s): POCBNP CBG: Recent Labs  Lab 01/01/20 0953  GLUCAP 122*   D-Dimer No results for input(s): DDIMER in the last 72 hours. Hgb A1c No results for input(s): HGBA1C in the last 72 hours. Lipid Profile No results for input(s): CHOL, HDL, LDLCALC, TRIG, CHOLHDL, LDLDIRECT in the last 72 hours. Thyroid function studies No results for input(s):  TSH, T4TOTAL, T3FREE, THYROIDAB in the last 72 hours.  Invalid input(s): FREET3 Anemia work up No results for input(s): VITAMINB12, FOLATE, FERRITIN, TIBC, IRON, RETICCTPCT in the last 72 hours. Urinalysis    Component Value Date/Time   COLORURINE YELLOW 09/17/2016 1155   APPEARANCEUR [place] (A) 09/17/2016 1155   LABSPEC 1.025 09/17/2016 1155   PHURINE 5.0 09/17/2016 1155   GLUCOSEU NEGATIVE 09/17/2016 1155   HGBUR NEGATIVE 09/17/2016 1155   BILIRUBINUR NEGATIVE 09/17/2016 1155   BILIRUBINUR n 06/18/2016 1016   KETONESUR 15 (A) 09/17/2016 1155   PROTEINUR n 06/18/2016 1016   PROTEINUR NEGATIVE 06/02/2016 1753   UROBILINOGEN 0.2 09/17/2016 1155   NITRITE NEGATIVE 09/17/2016 1155   LEUKOCYTESUR TRACE (A) 09/17/2016 1155   Sepsis Labs Invalid input(s): PROCALCITONIN,  WBC,  LACTICIDVEN Microbiology Recent Results (from the past 240 hour(s))  Respiratory Panel by RT PCR (Flu A&B, Covid) - Nasopharyngeal Swab     Status: None   Collection Time: 12/30/19  2:29 PM   Specimen: Nasopharyngeal Swab  Result Value Ref Range Status   SARS Coronavirus 2 by RT PCR NEGATIVE NEGATIVE Final    Comment: (NOTE) SARS-CoV-2 target nucleic acids are NOT DETECTED.  The SARS-CoV-2 RNA is generally detectable in upper respiratoy specimens during the acute phase of infection. The lowest concentration of SARS-CoV-2 viral copies this assay can detect is 131 copies/mL. A negative result does not preclude SARS-Cov-2 infection and should not be used as the sole basis for treatment or  other patient management decisions. A negative result may occur with  improper specimen collection/handling, submission of specimen other than nasopharyngeal swab, presence of viral mutation(s) within the areas targeted by this assay, and inadequate number of viral copies (<131 copies/mL). A negative result must be combined with clinical observations, patient history, and epidemiological information. The expected result  is Negative.  Fact Sheet for Patients:  PinkCheek.be  Fact Sheet for Healthcare Providers:  GravelBags.it  This test is no t yet approved or cleared by the [place] FDA and  has been authorized for detection and/or diagnosis of SARS-CoV-2 by FDA under an Emergency Use Authorization (EUA). This EUA will remain  in effect (meaning this test can be used) for the duration of the COVID-19 declaration under Section 564(b)(1) of the Act, 21 U.S.C. section 360bbb-3(b)(1), unless the authorization is terminated or revoked sooner.     Influenza A by PCR NEGATIVE NEGATIVE Final   Influenza B by PCR NEGATIVE NEGATIVE Final    Comment: (NOTE) The Xpert Xpress SARS-CoV-2/FLU/RSV assay is intended as an aid in  the diagnosis of influenza from Nasopharyngeal swab specimens and  should not be used as a sole basis for treatment. Nasal washings and  aspirates are unacceptable for Xpert Xpress SARS-CoV-2/FLU/RSV  testing.  Fact Sheet for Patients: PinkCheek.be  Fact Sheet for Healthcare Providers: GravelBags.it  This test is not yet approved or cleared by the [place] FDA and  has been authorized for detection and/or diagnosis of SARS-CoV-2 by  FDA under an Emergency Use Authorization (EUA). This EUA will remain  in effect (meaning this test can be used) for the duration of the  Covid-19 declaration under Section 564(b)(1) of the Act, 21  U.S.C. section 360bbb-3(b)(1), unless the authorization is  terminated or revoked. Performed at [facility], [place] [address], [place], [place] [address]   Surgical PCR screen     Status: None   Collection Time: 01/01/20  5:05 AM   Specimen: Nasal Mucosa; Nasal Swab  Result Value Ref Range Status   MRSA, PCR NEGATIVE NEGATIVE Final   Staphylococcus aureus NEGATIVE NEGATIVE Final    Comment: (NOTE) The Xpert SA  Assay (FDA approved for NASAL specimens in patients 1 years of age and older), is one component of a comprehensive surveillance program. It is not intended to diagnose infection nor to guide or monitor treatment. Performed at [facility], [place] [address] [place],  [address]   SARS Coronavirus 2 by RT PCR (hospital order, performed in [facility] hospital lab) Nasopharyngeal Nasopharyngeal Swab     Status: None   Collection Time: 01/04/20 11:16 AM   Specimen: Nasopharyngeal Swab  Result Value Ref Range Status   SARS Coronavirus 2 NEGATIVE NEGATIVE Final    Comment: (NOTE) SARS-CoV-2 target nucleic acids are NOT DETECTED.  The SARS-CoV-2 RNA is generally detectable in upper and lower respiratory specimens during the acute phase of infection. The lowest concentration of SARS-CoV-2 viral copies this assay can detect is 250 copies / mL. A negative result does not preclude SARS-CoV-2 infection and should not be used as the sole basis for treatment or other patient management decisions.  A negative result may occur with improper specimen collection / handling, submission of specimen other than nasopharyngeal swab, presence of viral mutation(s) within the areas targeted by this assay, and inadequate number of viral copies (<250 copies / mL). A negative result must be combined with clinical observations, patient history, and epidemiological information.  Fact Sheet for  Patients:   StrictlyIdeas.no  Fact Sheet for Healthcare Providers: BankingDealers.co.za  This test is not yet approved or  cleared by the [place] FDA and has been authorized for detection and/or diagnosis of SARS-CoV-2 by FDA under an Emergency Use Authorization (EUA).  This EUA will remain in effect (meaning this test can be used) for the duration of the COVID-19 declaration under Section 564(b)(1) of the Act, 21 U.S.C. section 360bbb-3(b)(1),  unless the authorization is terminated or revoked sooner.  Performed at [facility], [place] [address] [place], [place] [address]     Procedures/Studies: DG Pelvis 1-2 Views  Result Date: 12/30/2019 CLINICAL DATA:  Pt c/o left groin pain s/p fall today while carrying trash to dumpster. EXAM: PELVIS - 1-2 VIEW; DG HIP (WITH OR WITHOUT PELVIS) 2-3V LEFT; LEFT FEMUR 2 VIEWS COMPARISON:  X-ray pelvis 07/31/2013. FINDINGS: Acute, comminuted, left femoral intertrochanteric fracture. No left hip dislocation. No acute fracture or dislocation of the left femur distally. The left knee is visualized on the AP view and demonstrates severe medial tibiofemoral compartment degenerative changes with marked joint space narrowing and osteophyte marginal formation. There is no evidence of pelvic fracture or diastasis. No pelvic bone lesions are seen. Visualized portions of the lower lumbar spine demonstrates degenerative changes. Partially visualized sacrum is grossly unremarkable with limited evaluation due to overlying bowel. Right hip arthroplasty. The surgical hardware femoral head component appears to be in alignment with the acetabulum. No evidence of proximal right femoral fracture. Vascular calcifications. Similar-appearing peripherally calcified calcification overlying the left pelvis. Right inguinal region surgical clips. Round calcification overlying the region of the left gluteus soft tissues likely represents an injection granuloma. IMPRESSION: 1. Acute comminuted left femoral intertrochanteric fracture. No left hip dislocation. 2. Right hip arthroplasty with no radiographic evidence of surgical hardware complication. 3. No acute displaced fracture or diastasis of the bones of the pelvis. 4. Severe left knee degenerative changes with no findings to suggest acute displaced fracture or dislocation of the bones of the distal left femur. Please note limited evaluation of the left knee with only a  single AP view. Electronically Signed   By: Iven Finn M.D.   On: 12/30/2019 15:38   Pelvis Portable  Result Date: 01/01/2020 CLINICAL DATA:  Left femoral intramedullary nail placement. EXAM: PORTABLE PELVIS 1-2 VIEWS COMPARISON:  01/01/2020 FINDINGS: Status post intramedullary fixation of a left femoral intratrochanteric fracture. Similar alignment relative to intraoperative fluoroscopic imaging. Mild distraction of the major fracture fragments, otherwise anatomic in alignment. Persistent medial displacement of lesser trochanteric fracture fragment. Right total hip arthroplasty without radiographic evidence of complication. No no fracture identified. Both hips are located. Atherosclerotic vascular calcifications. IMPRESSION: Status post intramedullary fixation of a left femoral intratrochanteric fracture. Similar alignment relative to recent intraoperative fluoroscopic imaging. Persistent medial displacement of lesser trochanteric fracture fragment Electronically Signed   By: Margaretha Sheffield MD   On: 01/01/2020 12:41   DG Chest Portable 1 View  Result Date: 12/30/2019 CLINICAL DATA:  Fall EXAM: PORTABLE CHEST 1 VIEW COMPARISON:  Chest x-ray 03/23/2018. FINDINGS: The heart size and mediastinal contours are unchanged. Aortic valve replacement. Mitral annular calcifications again noted. Aortic arch calcifications. No focal consolidation. No pulmonary edema. No pleural effusion. No pneumothorax. Multilevel degenerative changes of the visualized spine. Degenerative changes of bilateral shoulders. No acute displaced fracture. IMPRESSION: No active cardiopulmonary disease. Electronically Signed   By: Iven Finn M.D.   On: 12/30/2019 15:41   DG C-Arm 1-60 Min-No Report  Result  Date: 01/01/2020 Fluoroscopy was utilized by the requesting physician.  No radiographic interpretation.   DG HIP OPERATIVE UNILAT WITH PELVIS LEFT  Result Date: 01/01/2020 CLINICAL DATA:  LEFT hip intramedullary rod EXAM:  OPERATIVE LEFT HIP (WITH PELVIS IF PERFORMED) 2 VIEWS TECHNIQUE: Fluoroscopic spot image(s) were submitted for interpretation post-operatively. COMPARISON:  December 30, 2019 FINDINGS: Spot fluoroscopy images were obtained for surgical planning purposes. Revisualization of comminuted inter trochanteric fracture. Spot fluoroscopy images demonstrate placement of an intramedullary rod with improved near anatomic alignment of the fracture fragments. Lesser trochanter remains displaced. Osteopenia. No additional acute fracture or dislocation noted. IMPRESSION: Spot fluoroscopy images were obtained for surgical planning purposes. Status post LEFT intramedullary rod fixation. Electronically Signed   By: Valentino Saxon MD   On: 01/01/2020 10:55   DG Hip Unilat W or Wo Pelvis 2-3 Views Left  Result Date: 12/30/2019 CLINICAL DATA:  Pt c/o left groin pain s/p fall today while carrying trash to dumpster. EXAM: PELVIS - 1-2 VIEW; DG HIP (WITH OR WITHOUT PELVIS) 2-3V LEFT; LEFT FEMUR 2 VIEWS COMPARISON:  X-ray pelvis 07/31/2013. FINDINGS: Acute, comminuted, left femoral intertrochanteric fracture. No left hip dislocation. No acute fracture or dislocation of the left femur distally. The left knee is visualized on the AP view and demonstrates severe medial tibiofemoral compartment degenerative changes with marked joint space narrowing and osteophyte marginal formation. There is no evidence of pelvic fracture or diastasis. No pelvic bone lesions are seen. Visualized portions of the lower lumbar spine demonstrates degenerative changes. Partially visualized sacrum is grossly unremarkable with limited evaluation due to overlying bowel. Right hip arthroplasty. The surgical hardware femoral head component appears to be in alignment with the acetabulum. No evidence of proximal right femoral fracture. Vascular calcifications. Similar-appearing peripherally calcified calcification overlying the left pelvis. Right inguinal region  surgical clips. Round calcification overlying the region of the left gluteus soft tissues likely represents an injection granuloma. IMPRESSION: 1. Acute comminuted left femoral intertrochanteric fracture. No left hip dislocation. 2. Right hip arthroplasty with no radiographic evidence of surgical hardware complication. 3. No acute displaced fracture or diastasis of the bones of the pelvis. 4. Severe left knee degenerative changes with no findings to suggest acute displaced fracture or dislocation of the bones of the distal left femur. Please note limited evaluation of the left knee with only a single AP view. Electronically Signed   By: Iven Finn M.D.   On: 12/30/2019 15:38   DG Femur Min 2 Views Left  Result Date: 12/30/2019 CLINICAL DATA:  Pt c/o left groin pain s/p fall today while carrying trash to dumpster. EXAM: PELVIS - 1-2 VIEW; DG HIP (WITH OR WITHOUT PELVIS) 2-3V LEFT; LEFT FEMUR 2 VIEWS COMPARISON:  X-ray pelvis 07/31/2013. FINDINGS: Acute, comminuted, left femoral intertrochanteric fracture. No left hip dislocation. No acute fracture or dislocation of the left femur distally. The left knee is visualized on the AP view and demonstrates severe medial tibiofemoral compartment degenerative changes with marked joint space narrowing and osteophyte marginal formation. There is no evidence of pelvic fracture or diastasis. No pelvic bone lesions are seen. Visualized portions of the lower lumbar spine demonstrates degenerative changes. Partially visualized sacrum is grossly unremarkable with limited evaluation due to overlying bowel. Right hip arthroplasty. The surgical hardware femoral head component appears to be in alignment with the acetabulum. No evidence of proximal right femoral fracture. Vascular calcifications. Similar-appearing peripherally calcified calcification overlying the left pelvis. Right inguinal region surgical clips. Round calcification overlying the region of the left  gluteus soft  tissues likely represents an injection granuloma. IMPRESSION: 1. Acute comminuted left femoral intertrochanteric fracture. No left hip dislocation. 2. Right hip arthroplasty with no radiographic evidence of surgical hardware complication. 3. No acute displaced fracture or diastasis of the bones of the pelvis. 4. Severe left knee degenerative changes with no findings to suggest acute displaced fracture or dislocation of the bones of the distal left femur. Please note limited evaluation of the left knee with only a single AP view. Electronically Signed   By: Iven Finn M.D.   On: 12/30/2019 15:38     Time coordinating discharge: Over 30 minutes    Dwyane Dee, MD  [facility] 01/04/2020, 1:09 PM Pager: Secure chat  If 7PM-7AM, please contact night-coverage [email or web address] Password [email or web address]

## 2020-01-04 NOTE — Progress Notes (Signed)
Physical Therapy Treatment Patient Details Name: Sara Garza MRN: [id number] DOB: 1923-11-19 Today's Date: 01/04/2020    History of Present Illness Pt s/p fall with L intertrochanteric femur fx and now s/p IM nailing.  Pt with hx of DM, aortic stenosis, CAD carotic stenosis, cardiac valve replacement, and R hip fx s/p hemi-arthroplasty (2015)    PT Comments    +2 max assist for supine to sit, +2 mod assist for sit to stand. In standing with RW, pt was unable to weight shift to take any steps, so Stedy was used to pivot to recliner. Instructed pt in LLE exercises. Pt puts forth good effort. SNF recommended.   Follow Up Recommendations  SNF     Equipment Recommendations  None recommended by PT    Recommendations for Other Services       Precautions / Restrictions Precautions Precautions: Fall Restrictions Weight Bearing Restrictions: Yes LLE Weight Bearing: Weight bearing as tolerated    Mobility  Bed Mobility Overal bed mobility: Needs Assistance Bed Mobility: Supine to Sit     Supine to sit: +2 for physical assistance;+2 for safety/equipment;Max assist     General bed mobility comments: assist to raise trunk, advance LEs, and pivot hips to edge of bed  Transfers Overall transfer level: Needs assistance Equipment used: Rolling walker (2 wheeled) Transfers: Sit to/from [facility] Sit to Stand: Mod assist;+2 physical assistance;+2 safety/equipment Stand pivot transfers: Mod assist;+2 physical assistance;+2 safety/equipment       General transfer comment: cues for LE management and use of UEs to self assist;  Physical assist to bring wt up and fwd and to balance in standing with RW. In RW pt was unable to weight shift to take any steps. So switched to stedy for pivot to recliner.  Ambulation/Gait             General Gait Details: unable   Stairs             Wheelchair Mobility    Modified Rankin (Stroke Patients Only)        Balance Overall balance assessment: Needs assistance Sitting-balance support: Feet supported;No upper extremity supported Sitting balance-Leahy Scale: Fair     Standing balance support: Bilateral upper extremity supported Standing balance-Leahy Scale: Poor                              Cognition Arousal/Alertness: Awake/alert Behavior During Therapy: WFL for tasks assessed/performed Overall Cognitive Status: Within Functional Limits for tasks assessed                                        Exercises General Exercises - Lower Extremity Ankle Circles/Pumps: AROM;Both;10 reps;Supine Quad Sets: AROM;Left;5 reps;Supine Long Arc Quad: AROM;Left;Seated (3 reps) Heel Slides: AAROM;Left;10 reps;Supine Hip ABduction/ADduction: AROM;Left;10 reps;Supine    General Comments        Pertinent Vitals/Pain Pain Assessment: 0-10 Pain Score: 8  Pain Location: L hip Pain Descriptors / Indicators: Grimacing;Guarding Pain Intervention(s): Limited activity within patient's tolerance;Monitored during session;Premedicated before session;Patient requesting pain meds-RN notified (pt declined ice)    Home Living                      Prior Function            PT Goals (current goals can now be found in the care  plan section) Acute Rehab PT Goals Patient Stated Goal: Regain IND PT Goal Formulation: With patient/family Time For Goal Achievement: 01/16/20 Potential to Achieve Goals: Good Progress towards PT goals: Progressing toward goals    Frequency    Min 3X/week      PT Plan Current plan remains appropriate    Co-evaluation              AM-PAC PT "6 Clicks" Mobility   Outcome Measure  Help needed turning from your back to your side while in a flat bed without using bedrails?: A Lot Help needed moving from lying on your back to sitting on the side of a flat bed without using bedrails?: A Lot Help needed moving to and from a bed to a  chair (including a wheelchair)?: A Lot Help needed standing up from a chair using your arms (e.g., wheelchair or bedside chair)?: A Lot Help needed to walk in hospital room?: Total Help needed climbing 3-5 steps with a railing? : Total 6 Click Score: 10    End of Session Equipment Utilized During Treatment: Gait belt Activity Tolerance: Patient limited by fatigue;Patient limited by pain Patient left: with call bell/phone within reach;with bed alarm set;in chair;with family/visitor present Nurse Communication: Mobility status;Need for lift equipment;Patient requests pain meds PT Visit Diagnosis: Difficulty in walking, not elsewhere classified (R26.2);Muscle weakness (generalized) (M62.81);Pain Pain - Right/Left: Left Pain - part of body: Hip     Time: 1001-1033 PT Time Calculation (min) (ACUTE ONLY): 32 min  Charges:  $Therapeutic Exercise: 8-22 mins $Therapeutic Activity: 8-22 mins                     Blondell Reveal Kistler PT 01/04/2020  Acute Rehabilitation Services Pager [phone] Office [phone]

## 2020-01-04 NOTE — Progress Notes (Signed)
Report called to RN at [place].

## 2020-01-09 DIAGNOSIS — I10 Essential (primary) hypertension: Secondary | ICD-10-CM | POA: Diagnosis not present

## 2020-01-09 DIAGNOSIS — I050 Rheumatic mitral stenosis: Secondary | ICD-10-CM | POA: Diagnosis not present

## 2020-01-09 DIAGNOSIS — N183 Chronic kidney disease, stage 3 unspecified: Secondary | ICD-10-CM | POA: Diagnosis not present

## 2020-01-09 DIAGNOSIS — D62 Acute posthemorrhagic anemia: Secondary | ICD-10-CM | POA: Diagnosis not present

## 2020-01-09 DIAGNOSIS — S7292XA Unspecified fracture of left femur, initial encounter for closed fracture: Secondary | ICD-10-CM | POA: Diagnosis not present

## 2020-01-09 DIAGNOSIS — I5033 Acute on chronic diastolic (congestive) heart failure: Secondary | ICD-10-CM | POA: Diagnosis not present

## 2020-01-12 ENCOUNTER — Ambulatory Visit: Payer: Medicare Other | Admitting: Physical Medicine and Rehabilitation

## 2020-01-13 ENCOUNTER — Telehealth: Payer: Self-pay | Admitting: Adult Health

## 2020-01-13 ENCOUNTER — Other Ambulatory Visit: Payer: Self-pay | Admitting: *Deleted

## 2020-01-13 NOTE — Telephone Encounter (Signed)
Pt call and stated she  want [name] to know that she fell and broke her hip and she is at [facility] in [place].

## 2020-01-13 NOTE — Patient Outreach (Signed)
Member screened for potential [facility] Care Management needs as a benefit of [employer].  Per Patient Ping member resides in [facility].   Communication sent to [facility] SW to request update on transtion plans.   Will continue to follow while member resides in SNF.  Ame Heagle, MSN-Ed, RN,BSN [facility] Post Acute Care Coordinator [phone] ( [employer]) [phone]  (Toll free office)  

## 2020-01-18 DIAGNOSIS — S72145D Nondisplaced intertrochanteric fracture of left femur, subsequent encounter for closed fracture with routine healing: Secondary | ICD-10-CM | POA: Diagnosis not present

## 2020-01-21 ENCOUNTER — Telehealth: Payer: Self-pay | Admitting: Adult Health

## 2020-01-21 ENCOUNTER — Other Ambulatory Visit: Payer: Self-pay | Admitting: *Deleted

## 2020-01-21 NOTE — Patient Outreach (Signed)
[facility] Post- Acute Care Coordinator follow up. Member screened for potential [facility] Care Management needs as a benefit of [place] Medicare.  Update received from [facility] SW indicating member will transition to home on 01/22/20 with her daughter's support.   Will follow up with member/daughter (DPR) Bradd Burner to discuss potential [place] Management services.    Marthenia Rolling, MSN-Ed, RN,BSN [place] Acute Care Coordinator [phone] [facility]) [phone]  (Toll free office)

## 2020-01-21 NOTE — Telephone Encounter (Signed)
Sara Garza is calling in to make an follow-up appointment from her discharge on 01/22/2020 going home with skill nursing and the pts family is requesting a Friday afternoon appointment.  [facility] facility recommend 1 1/2 to 2 weeks follow-up appointment.  Pts daughter [name] can be reached at [phone] with that appointment.  [name] only has virtual appointments and the family would like to come in to the office but if they had to do a virtual they will per the agent at [facility].

## 2020-01-25 ENCOUNTER — Other Ambulatory Visit: Payer: Self-pay | Admitting: *Deleted

## 2020-01-25 ENCOUNTER — Telehealth: Payer: Self-pay | Admitting: Adult Health

## 2020-01-25 DIAGNOSIS — E785 Hyperlipidemia, unspecified: Secondary | ICD-10-CM | POA: Diagnosis not present

## 2020-01-25 DIAGNOSIS — S72142D Displaced intertrochanteric fracture of left femur, subsequent encounter for closed fracture with routine healing: Secondary | ICD-10-CM | POA: Diagnosis not present

## 2020-01-25 DIAGNOSIS — K5900 Constipation, unspecified: Secondary | ICD-10-CM | POA: Diagnosis not present

## 2020-01-25 DIAGNOSIS — Z955 Presence of coronary angioplasty implant and graft: Secondary | ICD-10-CM | POA: Diagnosis not present

## 2020-01-25 DIAGNOSIS — I447 Left bundle-branch block, unspecified: Secondary | ICD-10-CM | POA: Diagnosis not present

## 2020-01-25 DIAGNOSIS — I050 Rheumatic mitral stenosis: Secondary | ICD-10-CM | POA: Diagnosis not present

## 2020-01-25 DIAGNOSIS — E1142 Type 2 diabetes mellitus with diabetic polyneuropathy: Secondary | ICD-10-CM | POA: Diagnosis not present

## 2020-01-25 DIAGNOSIS — Z9181 History of falling: Secondary | ICD-10-CM | POA: Diagnosis not present

## 2020-01-25 DIAGNOSIS — I10 Essential (primary) hypertension: Secondary | ICD-10-CM | POA: Diagnosis not present

## 2020-01-25 DIAGNOSIS — I5033 Acute on chronic diastolic (congestive) heart failure: Secondary | ICD-10-CM | POA: Diagnosis not present

## 2020-01-25 DIAGNOSIS — Z79891 Long term (current) use of opiate analgesic: Secondary | ICD-10-CM | POA: Diagnosis not present

## 2020-01-25 DIAGNOSIS — H409 Unspecified glaucoma: Secondary | ICD-10-CM | POA: Diagnosis not present

## 2020-01-25 DIAGNOSIS — M1712 Unilateral primary osteoarthritis, left knee: Secondary | ICD-10-CM | POA: Diagnosis not present

## 2020-01-25 DIAGNOSIS — W19XXXD Unspecified fall, subsequent encounter: Secondary | ICD-10-CM | POA: Diagnosis not present

## 2020-01-25 DIAGNOSIS — E1122 Type 2 diabetes mellitus with diabetic chronic kidney disease: Secondary | ICD-10-CM | POA: Diagnosis not present

## 2020-01-25 DIAGNOSIS — I130 Hypertensive heart and chronic kidney disease with heart failure and stage 1 through stage 4 chronic kidney disease, or unspecified chronic kidney disease: Secondary | ICD-10-CM | POA: Diagnosis not present

## 2020-01-25 DIAGNOSIS — Z7982 Long term (current) use of aspirin: Secondary | ICD-10-CM | POA: Diagnosis not present

## 2020-01-25 DIAGNOSIS — Z96641 Presence of right artificial hip joint: Secondary | ICD-10-CM | POA: Diagnosis not present

## 2020-01-25 DIAGNOSIS — D62 Acute posthemorrhagic anemia: Secondary | ICD-10-CM | POA: Diagnosis not present

## 2020-01-25 DIAGNOSIS — Z952 Presence of prosthetic heart valve: Secondary | ICD-10-CM | POA: Diagnosis not present

## 2020-01-25 DIAGNOSIS — I2510 Atherosclerotic heart disease of native coronary artery without angina pectoris: Secondary | ICD-10-CM | POA: Diagnosis not present

## 2020-01-25 DIAGNOSIS — I6522 Occlusion and stenosis of left carotid artery: Secondary | ICD-10-CM | POA: Diagnosis not present

## 2020-01-25 DIAGNOSIS — N183 Chronic kidney disease, stage 3 unspecified: Secondary | ICD-10-CM | POA: Diagnosis not present

## 2020-01-25 NOTE — Telephone Encounter (Signed)
We can use a virtual visit and have her come in to the office

## 2020-01-25 NOTE — Patient Outreach (Signed)
[place] Coordinator follow up. Member screened for potential [facility] Care Management needs as a benefit of [place] Medicare.  Sara Garza transitioned to home from [facility] on 01/22/20.  Telephone call made to Sara Garza [phone] to discuss [place] Management services. Sara Garza indicated she preferred for writer to speak with her daughter Sara Garza who was present.   Spoke with Sara Garza to explain [place] Management services. Sara Garza states [facility] PT just left and he went over medications and the like. Sara Garza states she plans on calling the PCP office to make post SNF appointment. States they have enough follow up services and that they will call Sara Garza and PCP office for any concerns or questions. Pleasantly declines [facility] Care Management.   Agreeable to Probation officer sending email with [facility] Management contact information if needed in the future.   Sara Rolling, MSN-Ed, RN,BSN [place] Acute Care Coordinator [phone] [facility]) [phone]  (Toll free office)

## 2020-01-25 NOTE — Telephone Encounter (Signed)
The patient fell. She was hospitalized and sent to the rehab center and then went home and Herbert Deaner Physical Therapist seen her today.  Her BP 160/80 sitting 130/78 standing with dizziness lasting about 30 minutes  Pt is wearing some compression hose. He wonders if she should have stronger compression hoses because they do not have a lot of force.  She has tenderness in Left calf with compression (squeezing)  While there has been several medication changes the patient is not following instructions on the medications.  She does not adhere very well to diabetic diet or congestive heart failure diet.  She is not checking her blood sugar or weighing daily  Recommendations for Home Health PT frequency  2 x weekly for 4 weeks 1 x weekly for 5 weeks  There is a pending order for OT evaluation for Home Health aid for bathing 1 x weekly for 1 week 2 x weekly for 3 weeks

## 2020-01-26 DIAGNOSIS — D62 Acute posthemorrhagic anemia: Secondary | ICD-10-CM | POA: Diagnosis not present

## 2020-01-26 DIAGNOSIS — S72142D Displaced intertrochanteric fracture of left femur, subsequent encounter for closed fracture with routine healing: Secondary | ICD-10-CM | POA: Diagnosis not present

## 2020-01-26 DIAGNOSIS — I5033 Acute on chronic diastolic (congestive) heart failure: Secondary | ICD-10-CM | POA: Diagnosis not present

## 2020-01-26 DIAGNOSIS — I130 Hypertensive heart and chronic kidney disease with heart failure and stage 1 through stage 4 chronic kidney disease, or unspecified chronic kidney disease: Secondary | ICD-10-CM | POA: Diagnosis not present

## 2020-01-26 DIAGNOSIS — N183 Chronic kidney disease, stage 3 unspecified: Secondary | ICD-10-CM | POA: Diagnosis not present

## 2020-01-26 DIAGNOSIS — E1122 Type 2 diabetes mellitus with diabetic chronic kidney disease: Secondary | ICD-10-CM | POA: Diagnosis not present

## 2020-01-27 ENCOUNTER — Telehealth: Payer: Self-pay

## 2020-01-27 DIAGNOSIS — E1122 Type 2 diabetes mellitus with diabetic chronic kidney disease: Secondary | ICD-10-CM | POA: Diagnosis not present

## 2020-01-27 DIAGNOSIS — I5033 Acute on chronic diastolic (congestive) heart failure: Secondary | ICD-10-CM | POA: Diagnosis not present

## 2020-01-27 DIAGNOSIS — N183 Chronic kidney disease, stage 3 unspecified: Secondary | ICD-10-CM | POA: Diagnosis not present

## 2020-01-27 DIAGNOSIS — I130 Hypertensive heart and chronic kidney disease with heart failure and stage 1 through stage 4 chronic kidney disease, or unspecified chronic kidney disease: Secondary | ICD-10-CM | POA: Diagnosis not present

## 2020-01-27 DIAGNOSIS — D62 Acute posthemorrhagic anemia: Secondary | ICD-10-CM | POA: Diagnosis not present

## 2020-01-27 DIAGNOSIS — S72142D Displaced intertrochanteric fracture of left femur, subsequent encounter for closed fracture with routine healing: Secondary | ICD-10-CM | POA: Diagnosis not present

## 2020-01-27 NOTE — Telephone Encounter (Signed)
Spoke with Sara Garza to inform him of the approval for orders as below.

## 2020-01-27 NOTE — Telephone Encounter (Signed)
Ok for verbal orders ?

## 2020-01-27 NOTE — Telephone Encounter (Signed)
Pts daughter called in and scheduled an appointment on Thursday 02/03/2020 at 11:00

## 2020-01-27 NOTE — Telephone Encounter (Signed)
Spoke with Clair Gulling to inform him of the approval for verbal orders and message below.

## 2020-01-27 NOTE — Telephone Encounter (Signed)
[facility] home health nurse calling to get VO for this patient   1x week for 2weeks 2 week 1 1 week 1 0 week 1  1 week 2  For [place] transfers and exercise

## 2020-01-27 NOTE — Telephone Encounter (Signed)
Okay for verbal orders.  She does have a tough time putting on her compression socks she has now, likely she would not be able to get the compression socks on if they were tighter.  Discuss other issues with her at her upcoming appointment

## 2020-01-28 DIAGNOSIS — I130 Hypertensive heart and chronic kidney disease with heart failure and stage 1 through stage 4 chronic kidney disease, or unspecified chronic kidney disease: Secondary | ICD-10-CM | POA: Diagnosis not present

## 2020-01-28 DIAGNOSIS — I5033 Acute on chronic diastolic (congestive) heart failure: Secondary | ICD-10-CM | POA: Diagnosis not present

## 2020-01-28 DIAGNOSIS — D62 Acute posthemorrhagic anemia: Secondary | ICD-10-CM | POA: Diagnosis not present

## 2020-01-28 DIAGNOSIS — N183 Chronic kidney disease, stage 3 unspecified: Secondary | ICD-10-CM | POA: Diagnosis not present

## 2020-01-28 DIAGNOSIS — S72142D Displaced intertrochanteric fracture of left femur, subsequent encounter for closed fracture with routine healing: Secondary | ICD-10-CM | POA: Diagnosis not present

## 2020-01-28 DIAGNOSIS — E1122 Type 2 diabetes mellitus with diabetic chronic kidney disease: Secondary | ICD-10-CM | POA: Diagnosis not present

## 2020-02-01 DIAGNOSIS — N183 Chronic kidney disease, stage 3 unspecified: Secondary | ICD-10-CM | POA: Diagnosis not present

## 2020-02-01 DIAGNOSIS — S72142D Displaced intertrochanteric fracture of left femur, subsequent encounter for closed fracture with routine healing: Secondary | ICD-10-CM | POA: Diagnosis not present

## 2020-02-01 DIAGNOSIS — E1122 Type 2 diabetes mellitus with diabetic chronic kidney disease: Secondary | ICD-10-CM | POA: Diagnosis not present

## 2020-02-01 DIAGNOSIS — D62 Acute posthemorrhagic anemia: Secondary | ICD-10-CM | POA: Diagnosis not present

## 2020-02-01 DIAGNOSIS — I130 Hypertensive heart and chronic kidney disease with heart failure and stage 1 through stage 4 chronic kidney disease, or unspecified chronic kidney disease: Secondary | ICD-10-CM | POA: Diagnosis not present

## 2020-02-01 DIAGNOSIS — I5033 Acute on chronic diastolic (congestive) heart failure: Secondary | ICD-10-CM | POA: Diagnosis not present

## 2020-02-02 DIAGNOSIS — E1122 Type 2 diabetes mellitus with diabetic chronic kidney disease: Secondary | ICD-10-CM | POA: Diagnosis not present

## 2020-02-02 DIAGNOSIS — S72142D Displaced intertrochanteric fracture of left femur, subsequent encounter for closed fracture with routine healing: Secondary | ICD-10-CM | POA: Diagnosis not present

## 2020-02-02 DIAGNOSIS — I130 Hypertensive heart and chronic kidney disease with heart failure and stage 1 through stage 4 chronic kidney disease, or unspecified chronic kidney disease: Secondary | ICD-10-CM | POA: Diagnosis not present

## 2020-02-02 DIAGNOSIS — I5033 Acute on chronic diastolic (congestive) heart failure: Secondary | ICD-10-CM | POA: Diagnosis not present

## 2020-02-02 DIAGNOSIS — N183 Chronic kidney disease, stage 3 unspecified: Secondary | ICD-10-CM | POA: Diagnosis not present

## 2020-02-02 DIAGNOSIS — D62 Acute posthemorrhagic anemia: Secondary | ICD-10-CM | POA: Diagnosis not present

## 2020-02-03 ENCOUNTER — Encounter: Payer: Self-pay | Admitting: Adult Health

## 2020-02-03 ENCOUNTER — Telehealth: Payer: Self-pay

## 2020-02-03 ENCOUNTER — Ambulatory Visit (INDEPENDENT_AMBULATORY_CARE_PROVIDER_SITE_OTHER): Payer: Medicare Other | Admitting: Adult Health

## 2020-02-03 ENCOUNTER — Other Ambulatory Visit: Payer: Self-pay

## 2020-02-03 VITALS — BP 120/56 | HR 81 | Temp 97.6°F | Wt 157.0 lb

## 2020-02-03 DIAGNOSIS — N1831 Chronic kidney disease, stage 3a: Secondary | ICD-10-CM | POA: Diagnosis not present

## 2020-02-03 DIAGNOSIS — S72142D Displaced intertrochanteric fracture of left femur, subsequent encounter for closed fracture with routine healing: Secondary | ICD-10-CM | POA: Diagnosis not present

## 2020-02-03 DIAGNOSIS — I5032 Chronic diastolic (congestive) heart failure: Secondary | ICD-10-CM | POA: Diagnosis not present

## 2020-02-03 DIAGNOSIS — D62 Acute posthemorrhagic anemia: Secondary | ICD-10-CM

## 2020-02-03 DIAGNOSIS — I10 Essential (primary) hypertension: Secondary | ICD-10-CM

## 2020-02-03 DIAGNOSIS — N183 Chronic kidney disease, stage 3 unspecified: Secondary | ICD-10-CM | POA: Diagnosis not present

## 2020-02-03 DIAGNOSIS — S72002A Fracture of unspecified part of neck of left femur, initial encounter for closed fracture: Secondary | ICD-10-CM | POA: Diagnosis not present

## 2020-02-03 DIAGNOSIS — I5033 Acute on chronic diastolic (congestive) heart failure: Secondary | ICD-10-CM | POA: Diagnosis not present

## 2020-02-03 DIAGNOSIS — I130 Hypertensive heart and chronic kidney disease with heart failure and stage 1 through stage 4 chronic kidney disease, or unspecified chronic kidney disease: Secondary | ICD-10-CM | POA: Diagnosis not present

## 2020-02-03 DIAGNOSIS — E1122 Type 2 diabetes mellitus with diabetic chronic kidney disease: Secondary | ICD-10-CM | POA: Diagnosis not present

## 2020-02-03 NOTE — Patient Instructions (Signed)
It was great seeing you today   I am so happy you are progressing.   Cut back on the Lasix from 40 mg to 20 mg   Take Coreg 3.125 mg twice a day   Continue with aspirin twice a day for two more weeks and then you can stop   I will follow up with you regarding your blood work

## 2020-02-03 NOTE — Progress Notes (Signed)
Chronic Care Management Pharmacy Assistant   Name: Sara Garza  MRN: [id number] DOB: 06-Nov-1923  Reason for Encounter: Hyperlipidemia  Disease State Call.  PCP : [name], NP  Allergies:   Allergies  Allergen Reactions  . Statins Other (See Comments)    Muscle aches  . Gabapentin Other (See Comments)    SOMNOLENCE    Medications: Outpatient Encounter Medications as of 02/03/2020  Medication Sig Note  . acetaminophen (TYLENOL) 500 MG tablet Take 2 tablets (1,000 mg total) by mouth 4 (four) times daily.   [name] amLODipine (NORVASC) 5 MG tablet TAKE 1 AND 1/2 TABLETS(7.5 MG) BY MOUTH DAILY (Patient taking differently: Take 7.5 mg by mouth daily. )   . aspirin (ASPIRIN CHILDRENS) 81 MG chewable tablet Chew 1 tablet (81 mg total) by mouth 2 (two) times daily with a meal.   . bimatoprost (LUMIGAN) 0.01 % SOLN Place 1 drop into both eyes at bedtime.    . Calcium Carb-Cholecalciferol (CALCIUM 500 +D) 500-400 MG-UNIT TABS Take 1 tablet by mouth daily.    . carvedilol (COREG) 6.25 MG tablet Take 1 tablet (6.25 mg total) by mouth 2 (two) times daily with a meal.   . ezetimibe (ZETIA) 10 MG tablet TAKE 1 TABLET(10 MG) BY MOUTH DAILY (Patient taking differently: Take 10 mg by mouth daily. )   . furosemide (LASIX) 20 MG tablet Take 1 tablet (20 mg total) by mouth daily. (Patient taking differently: Take 10-20 mg by mouth daily as needed for fluid. )   . isosorbide mononitrate (IMDUR) 60 MG 24 hr tablet TAKE 1 TABLET(60 MG) BY MOUTH DAILY (Patient taking differently: Take 60 mg by mouth daily. )   . losartan (COZAAR) 25 MG tablet TAKE 1 TABLET(25 MG) BY MOUTH DAILY (Patient taking differently: Take 25 mg by mouth daily. )   . Multiple Vitamin (MULTIVITAMIN) tablet Take 1 tablet by mouth daily.     . Multiple Vitamins-Minerals (PRESERVISION AREDS 2+MULTI VIT PO) Take 2 capsules by mouth in the morning and at bedtime.   [name] oxybutynin (DITROPAN-XL) 5 MG 24 hr tablet TAKE 1 TABLET(5 MG) BY MOUTH  AT BEDTIME (Patient taking differently: Take 5 mg by mouth at bedtime. ) 12/30/2019: Hasn't started.   No facility-administered encounter medications on file as of 02/03/2020.    Current Diagnosis: Patient Active Problem List   Diagnosis Date Noted  . Acute blood loss anemia 01/03/2020  . Eye irritation 01/01/2020  . Mitral valve stenosis   . Benign essential HTN   . Chronic diastolic CHF (congestive heart failure) ([place])   . Hip fracture ([place]) 12/30/2019  . Closed fracture of femur, intertrochanteric, left, initial encounter ([place])   . Acute on chronic diastolic CHF (congestive heart failure) ([place]) 02/25/2018  . S/P [name] (transcatheter aortic valve replacement) 02/25/2018  . Leg pain   . Hyperlipidemia   . Glaucoma   . Carotid stenosis, left   . Arthritis   . Urinary incontinence 02/12/2017  . Chronic bilateral low back pain 08/12/2016  . Spondylosis of lumbar joint 08/12/2016  . Bilateral leg pain 08/12/2016  . Neck mass 04/15/2015  . Status post hip hemiarthroplasty 07/31/2013  . CAD (coronary artery disease) 07/31/2013  . CKD (chronic kidney disease), stage III ([place]) 07/31/2013  . Left bundle branch block 07/31/2013  . Severe calcific aortic stenosis 06/18/2010  . Diabetic polyneuropathy ([place]) 04/16/2010  . GLAUCOMA 01/06/2009  . CHEST PAIN, ATYPICAL 12/16/2008  . Bilateral shoulder pain 11/16/2007  . OSTEOARTHRITIS 02/02/2007  .  Type 2 diabetes mellitus with renal manifestations, controlled ([place]) 10/24/2006  . Dyslipidemia 10/02/2006  . Essential hypertension 10/02/2006    Follow-Up:  Pharmacist Review   02/03/2020 Name: Sara Garza MRN: [id number] DOB: 1924-03-03 Sara Garza is a 86 y.o. year old female who is a primary care patient of [name], NP.  Comprehensive medication review performed; Spoke to patient regarding cholesterol  Lipid Panel    Component Value Date/Time   CHOL 203 (H) 12/01/2019 1014   TRIG 149 12/01/2019 1014   HDL 42 (L) 12/01/2019  1014   LDLCALC 133 (H) 12/01/2019 1014   LDLDIRECT 143.0 11/26/2018 1201    10-year ASCVD risk score: The ASCVD Risk score Mikey Bussing DC Jr., et al., 2013) failed to calculate for the following reasons:   The 2013 ASCVD risk score is only valid for ages 40 to 93  . Current antihyperlipidemic regimen:   Aspirin 81 mg daily   Ezetimibe 10 mg daily  . Previous antihyperlipidemic medications tried:  - Rosuvastatin 20 mg  . ASCVD risk enhancing conditions: DM, HTN, CKD and CHF . What recent interventions/DTPs have been made by any provider to improve Cholesterol control since last CPP Visit: None ID . Any recent hospitalizations or ED visits since last visit with CPP? Yes  o 12/30/19 ED Fracture of femur . What diet changes have been made to improve Cholesterol?  ? Patient states she cook occasionally with salt added. . What exercise is being done to improve Cholesterol?  ? Patient reports she does not exercise because she can barley walk  Adherence Review: Does the patient have >5 day gap between last estimated fill dates? Yes   [place] Pharmacist Assistant [phone]

## 2020-02-03 NOTE — Progress Notes (Signed)
Subjective:    Patient ID: Sara Garza, female    DOB: 08/27/1923, 86 y.o.   MRN: [id number]  HPI TCM visit   Her daughter is with her today   Admit Date: 12/30/2019 Discharge Date 01/04/2020 Discharge from [facility] 01/22/2020  She presented to the emergency room with a mechanical fall.  She tripped and fell on her left hip while walking to a dumpster.  She denied lightheadedness or blurred vision, chest pain, or shortness of breath before the fall.  In the ER she underwent multiple x-rays which showed acute comminuted left femoral intertrochanteric fracture without hip dislocation  She underwent IM nail fixation of left hip fracture on 01/01/2020 and tolerated this procedure well.  She was discharged to [facility] on 01/02/2020.  She was placed on aspirin 81 mg twice daily x6 weeks for DVT prophylactics  After surgery her hemoglobin down trended some to 7.1 and on 10 4- she underwent 1 unit packed red blood cells transfusion, after this transfusion her hemoglobin improved to 9.7 the following morning  Hospital Course  1. Closed Fracture of Femur -Status post mechanical fall at home -Status post IM nail fixation with Ortho on 01/01/2020 -PT recommended SNF for ongoing rehab at discharge.  Ortho recommended WBAT with walker -Lovenox while hospitalized then aspirin 81 mg twice daily x6 weeks -Tinea with pain control  2.  Acute on chronic diastolic CHF -Cautionwith volume status; she is noted to have chronic lower extremity edema and DOE -ER and Lasix per cardiology  3.  CKD, stage III -Baseline creatinine 1.3-1.4.  She was baseline throughout admission  4.  Acute blood loss anemia -Patient's baseline was around 11 g/dL.  She was drifted down to 7.1 g/dL on 10 4 semisymptomatic.  They transfuse 1 unit packed red blood cells on 01/03/2020.  Gave Lasix 20 mg p.o. after blood transfusion and hemoglobin improved to 9.7  5.  Essential hypertension -Continue current regimen.   Beast P stable throughout hospital admission  6.  Type 2 diabetes mellitus with renal manifestations, controlled -Controlled with diet.  Last A1c was 5.7 on 12/01/2019  Today she reports that she is doing better since being discharged from rehab.  She was happy to get home to her own bed and better food.  She continues to be in some discomfort especially with ambulation to the left hip.  She was prescribed tramadol but this made her sick to her stomach so she stopped taking it and is managing her pain with Tylenol 1 g 3 times daily.  She is concerned about some medication changes that were made while at the rehab facility.  Her carvedilol was changed from 3.125 mg twice daily to 6.25 mg twice daily.  At the rehab facility her blood pressure was into the 90s over 40s and she was feeling lightheaded and dizzy. .  Once she got home she went back on her normal dose and her blood pressure has responded into the 120s over 70s to 80s.  Due to worsening CHF she was also started on Lasix 40 mg daily, she feels as though this is too much for her, her lower extremity edema has returned to normal, she is not short of breath and would like to return to 20 mg daily.  She had her home health evaluation late last week when she will be doing PT/OT twice a week starting this week.  Overall she feels as though she is progressing and getting stronger.  She has no acute  complaints today  Review of Systems  Constitutional: Negative.   HENT: Negative.   Eyes: Negative.   Respiratory: Negative.   Cardiovascular: Negative.   Gastrointestinal: Negative.   Endocrine: Negative.   Genitourinary: Positive for frequency (from lasix) and urgency (from lasix).  Musculoskeletal: Positive for arthralgias, gait problem and myalgias.  Skin: Negative.   Allergic/Immunologic: Negative.   Hematological: Negative.   Psychiatric/Behavioral: Negative.    Past Medical History:  Diagnosis Date  . Arthritis   . CAD (coronary artery  disease)   . Carotid stenosis, left    50-60% stable  . Diabetes mellitus   . Glaucoma   . Hyperlipidemia   . Hypertension   . Leg pain    ABIs 2/18: normal bilaterally.  . Osteoporosis   . Valvular heart disease    Aortic Stenosis s/p [name] 2014 // Mitral stenosis // Echo 09/2018: EF > 65, mod LVH, severe focal basal hypertrophy, RVSP 39.8, mild to mod MR, mod to severe MS (mean 9), AVR with mild AI, severe LAE (similar to prior echo)    Social History   Socioeconomic History  . Marital status: Widowed    Spouse name: Not on file  . Number of children: Not on file  . Years of education: Not on file  . Highest education level: Not on file  Occupational History  . Not on file  Tobacco Use  . Smoking status: Passive Smoke Exposure - Never Smoker  . Smokeless tobacco: Never Used  Substance and Sexual Activity  . Alcohol use: Yes    Alcohol/week: 0.0 standard drinks    Comment: very seldom  . Drug use: No  . Sexual activity: Not Currently  Other Topics Concern  . Not on file  Social History Narrative   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      She likes to read and she takes classes at [employer] Strain: High Risk  . Difficulty of Paying Living Expenses: Hard  Food Insecurity:   . Worried About Charity fundraiser in the Last Year: Not on file  . Ran Out of Food in the Last Year: Not on file  Transportation Needs: No Transportation Needs  . Lack of Transportation (Medical): No  . Lack of Transportation (Non-Medical): No  Physical Activity:   . Days of Exercise per Week: Not on file  . Minutes of Exercise per Session: Not on file  Stress:   . Feeling of Stress : Not on file  Social Connections:   . Frequency of Communication with Friends and Family: Not on file  . Frequency of Social Gatherings with Friends and Family: Not on file  . Attends Religious Services: Not on file    . Active Member of Clubs or Organizations: Not on file  . Attends Archivist Meetings: Not on file  . Marital Status: Not on file  Intimate Partner Violence:   . Fear of Current or Ex-Partner: Not on file  . Emotionally Abused: Not on file  . Physically Abused: Not on file  . Sexually Abused: Not on file    Past Surgical History:  Procedure Laterality Date  . ANOMALOUS PULMONARY VENOUS RETURN REPAIR, TOTAL    . APPENDECTOMY    . CARDIAC VALVE REPLACEMENT  04/29/2012  . Cataracts Bilateral   . CESAREAN SECTION    . FOOT SURGERY  [name]  . HIP ARTHROPLASTY Right 07/31/2013   Procedure: ARTHROPLASTY BIPOLAR HIP;  Surgeon: [name], MD;  Location: [facility];  Service: Orthopedics;  Laterality: Right;  . INTRAMEDULLARY (IM) NAIL INTERTROCHANTERIC Left 01/01/2020   Procedure: INTRAMEDULLARY (IM) NAIL INTERTROCHANTRIC;  Surgeon: Rod Can, MD;  Location: [facility];  Service: Orthopedics;  Laterality: Left;  . PERCUTANEOUS CORONARY STENT INTERVENTION (PCI-S) N/A 01/02/2012   Procedure: PERCUTANEOUS CORONARY STENT INTERVENTION (PCI-S);  Surgeon: Sherren Mocha, MD;  Location: [facility];  Service: Cardiovascular;  Laterality: N/A;    Family History  Problem Relation Age of Onset  . COPD Father   . Cancer Father        lung cancer  . Lung cancer Other     Allergies  Allergen Reactions  . Statins Other (See Comments)    Muscle aches  . Gabapentin Other (See Comments)    SOMNOLENCE    Current Outpatient Medications on File Prior to Visit  Medication Sig Dispense Refill  . acetaminophen (TYLENOL) 500 MG tablet Take 2 tablets (1,000 mg total) by mouth 4 (four) times daily. 30 tablet 0  . amLODipine (NORVASC) 5 MG tablet TAKE 1 AND 1/2 TABLETS(7.5 MG) BY MOUTH DAILY (Patient taking differently: Take 7.5 mg by mouth daily. ) 135 tablet 3  . aspirin (ASPIRIN CHILDRENS) 81 MG chewable tablet Chew 1 tablet (81 mg total) by mouth 2 (two) times daily with a meal. 90  tablet 0  . bimatoprost (LUMIGAN) 0.01 % SOLN Place 1 drop into both eyes at bedtime.     . Calcium Carb-Cholecalciferol (CALCIUM 500 +D) 500-400 MG-UNIT TABS Take 1 tablet by mouth daily.     . carvedilol (COREG) 6.25 MG tablet Take 1 tablet (6.25 mg total) by mouth 2 (two) times daily with a meal.    . ezetimibe (ZETIA) 10 MG tablet TAKE 1 TABLET(10 MG) BY MOUTH DAILY (Patient taking differently: Take 10 mg by mouth daily. ) 30 tablet 11  . furosemide (LASIX) 20 MG tablet Take 1 tablet (20 mg total) by mouth daily. (Patient taking differently: Take 40 mg by mouth daily as needed for fluid. ) 90 tablet 3  . isosorbide mononitrate (IMDUR) 60 MG 24 hr tablet TAKE 1 TABLET(60 MG) BY MOUTH DAILY (Patient taking differently: Take 60 mg by mouth daily. ) 90 tablet 3  . losartan (COZAAR) 25 MG tablet TAKE 1 TABLET(25 MG) BY MOUTH DAILY (Patient taking differently: Take 25 mg by mouth daily. ) 90 tablet 1  . Multiple Vitamin (MULTIVITAMIN) tablet Take 1 tablet by mouth daily.      . Multiple Vitamins-Minerals (PRESERVISION AREDS 2+MULTI VIT PO) Take 2 capsules by mouth in the morning and at bedtime.    [name] oxybutynin (DITROPAN-XL) 5 MG 24 hr tablet TAKE 1 TABLET(5 MG) BY MOUTH AT BEDTIME (Patient taking differently: Take 5 mg by mouth at bedtime. ) 90 tablet 0   No current facility-administered medications on file prior to visit.    BP (!) 120/56 (BP Location: Left Arm, Cuff Size: Normal)   Pulse 81   Temp 97.6 F (36.4 C) (Oral)   Wt 157 lb (71.2 kg)   SpO2 94%   BMI 33.97 kg/m       Objective:   Physical Exam Vitals and nursing note reviewed.  Constitutional:      Appearance: Normal appearance. She is normal weight.  Cardiovascular:     Rate and Rhythm: Normal rate and regular rhythm.     Pulses: Normal pulses.  Heart sounds: Murmur heard.   Pulmonary:     Effort: Pulmonary effort is normal.     Breath sounds: Normal breath sounds.  Musculoskeletal:        General: Swelling  present.     Right lower leg: Edema present.     Left lower leg: Edema present.  Skin:    General: Skin is warm and dry.     Capillary Refill: Capillary refill takes less than 2 seconds.  Neurological:     General: No focal deficit present.     Mental Status: She is alert and oriented to person, place, and time.     Motor: Weakness present.     Gait: Gait abnormal (slow steady gait with walker. She is able to get out of chair by herself. ).  Psychiatric:        Mood and Affect: Mood normal.        Behavior: Behavior normal.        Thought Content: Thought content normal.        Judgment: Judgment normal.           Assessment & Plan:  1. Closed fracture of left hip, initial encounter [facility]) -Viewed hospital notes, discharge instructions, and rehab discharge notes.  All questions answered to the best of my ability.  She was advised to work with PT/OT twice a week.  Can continue with Tylenol 3 g 3 times daily for pain relief.  Follow-up as needed  2. Essential hypertension - Go back to Coreg 3.125 mg BID and Lasix 20 mg daily  - CBC with Differential/Platelet; Future - Basic Metabolic Panel; Future - Brain Natriuretic Peptide; Future - Brain Natriuretic Peptide - Basic Metabolic Panel - CBC with Differential/Platelet  3. Chronic diastolic CHF (congestive heart failure) (HCC) - will have her decrease Lasix to 20 mg daily and switch back to Coreg 3.125 mg. Decreasing Lasix should bring up her BP some. She can take an extra dose PRN  - CBC with Differential/Platelet; Future - Basic Metabolic Panel; Future - Brain Natriuretic Peptide; Future - Brain Natriuretic Peptide - Basic Metabolic Panel - CBC with Differential/Platelet  4. Stage 3a chronic kidney disease (HCC)  - CBC with Differential/Platelet; Future - Basic Metabolic Panel; Future - Brain Natriuretic Peptide; Future - Brain Natriuretic Peptide - Basic Metabolic Panel - CBC with Differential/Platelet  5. Acute  blood loss anemia  - CBC with Differential/Platelet; Future  [name], NP

## 2020-02-04 DIAGNOSIS — I5033 Acute on chronic diastolic (congestive) heart failure: Secondary | ICD-10-CM | POA: Diagnosis not present

## 2020-02-04 DIAGNOSIS — E1122 Type 2 diabetes mellitus with diabetic chronic kidney disease: Secondary | ICD-10-CM | POA: Diagnosis not present

## 2020-02-04 DIAGNOSIS — D62 Acute posthemorrhagic anemia: Secondary | ICD-10-CM | POA: Diagnosis not present

## 2020-02-04 DIAGNOSIS — S72142D Displaced intertrochanteric fracture of left femur, subsequent encounter for closed fracture with routine healing: Secondary | ICD-10-CM | POA: Diagnosis not present

## 2020-02-04 DIAGNOSIS — N183 Chronic kidney disease, stage 3 unspecified: Secondary | ICD-10-CM | POA: Diagnosis not present

## 2020-02-04 DIAGNOSIS — I130 Hypertensive heart and chronic kidney disease with heart failure and stage 1 through stage 4 chronic kidney disease, or unspecified chronic kidney disease: Secondary | ICD-10-CM | POA: Diagnosis not present

## 2020-02-04 LAB — BASIC METABOLIC PANEL
BUN/Creatinine Ratio: 32 (calc) — ABNORMAL HIGH (ref 6–22)
BUN: 47 mg/dL — ABNORMAL HIGH (ref 7–25)
CO2: 26 mmol/L (ref 20–32)
Calcium: 9.8 mg/dL (ref 8.6–10.4)
Chloride: 105 mmol/L (ref 98–110)
Creat: 1.46 mg/dL — ABNORMAL HIGH (ref 0.60–0.88)
Glucose, Bld: 89 mg/dL (ref 65–99)
Potassium: 4.8 mmol/L (ref 3.5–5.3)
Sodium: 142 mmol/L (ref 135–146)

## 2020-02-04 LAB — CBC WITH DIFFERENTIAL/PLATELET
Absolute Monocytes: 790 cells/uL (ref 200–950)
Basophils Absolute: 53 cells/uL (ref 0–200)
Basophils Relative: 0.7 %
Eosinophils Absolute: 228 cells/uL (ref 15–500)
Eosinophils Relative: 3 %
HCT: 31.3 % — ABNORMAL LOW (ref 35.0–45.0)
Hemoglobin: 10.3 g/dL — ABNORMAL LOW (ref 11.7–15.5)
Lymphs Abs: 988 cells/uL (ref 850–3900)
MCH: 31.3 pg (ref 27.0–33.0)
MCHC: 32.9 g/dL (ref 32.0–36.0)
MCV: 95.1 fL (ref 80.0–100.0)
MPV: 11.7 fL (ref 7.5–12.5)
Monocytes Relative: 10.4 %
Neutro Abs: 5540 cells/uL (ref 1500–7800)
Neutrophils Relative %: 72.9 %
Platelets: 296 10*3/uL (ref 140–400)
RBC: 3.29 10*6/uL — ABNORMAL LOW (ref 3.80–5.10)
RDW: 13.9 % (ref 11.0–15.0)
Total Lymphocyte: 13 %
WBC: 7.6 10*3/uL (ref 3.8–10.8)

## 2020-02-04 LAB — BRAIN NATRIURETIC PEPTIDE: Brain Natriuretic Peptide: 521 pg/mL — ABNORMAL HIGH (ref <100)

## 2020-02-07 DIAGNOSIS — N183 Chronic kidney disease, stage 3 unspecified: Secondary | ICD-10-CM | POA: Diagnosis not present

## 2020-02-07 DIAGNOSIS — S72142D Displaced intertrochanteric fracture of left femur, subsequent encounter for closed fracture with routine healing: Secondary | ICD-10-CM | POA: Diagnosis not present

## 2020-02-07 DIAGNOSIS — I5033 Acute on chronic diastolic (congestive) heart failure: Secondary | ICD-10-CM | POA: Diagnosis not present

## 2020-02-07 DIAGNOSIS — E1122 Type 2 diabetes mellitus with diabetic chronic kidney disease: Secondary | ICD-10-CM | POA: Diagnosis not present

## 2020-02-07 DIAGNOSIS — I130 Hypertensive heart and chronic kidney disease with heart failure and stage 1 through stage 4 chronic kidney disease, or unspecified chronic kidney disease: Secondary | ICD-10-CM | POA: Diagnosis not present

## 2020-02-07 DIAGNOSIS — D62 Acute posthemorrhagic anemia: Secondary | ICD-10-CM | POA: Diagnosis not present

## 2020-02-08 ENCOUNTER — Telehealth: Payer: Self-pay | Admitting: Adult Health

## 2020-02-08 DIAGNOSIS — S72142D Displaced intertrochanteric fracture of left femur, subsequent encounter for closed fracture with routine healing: Secondary | ICD-10-CM | POA: Diagnosis not present

## 2020-02-08 DIAGNOSIS — N183 Chronic kidney disease, stage 3 unspecified: Secondary | ICD-10-CM | POA: Diagnosis not present

## 2020-02-08 DIAGNOSIS — E1122 Type 2 diabetes mellitus with diabetic chronic kidney disease: Secondary | ICD-10-CM | POA: Diagnosis not present

## 2020-02-08 DIAGNOSIS — I130 Hypertensive heart and chronic kidney disease with heart failure and stage 1 through stage 4 chronic kidney disease, or unspecified chronic kidney disease: Secondary | ICD-10-CM | POA: Diagnosis not present

## 2020-02-08 DIAGNOSIS — I5033 Acute on chronic diastolic (congestive) heart failure: Secondary | ICD-10-CM | POA: Diagnosis not present

## 2020-02-08 DIAGNOSIS — D62 Acute posthemorrhagic anemia: Secondary | ICD-10-CM | POA: Diagnosis not present

## 2020-02-08 NOTE — Telephone Encounter (Signed)
She is always short of breath with activity. Is this past her baseline?

## 2020-02-08 NOTE — Telephone Encounter (Signed)
Spoke with Sara Garza and informed her of the message below.  She stated she was calling to report this as the numbers were flagged due to parameters for the diastolic reading.  Message sent to PCP.

## 2020-02-08 NOTE — Telephone Encounter (Signed)
Per Dionika [facility] nurse  . Pt has a resting BP of 128/54. pt is complaining of shortness of breath with activity. The nurse would like a  call to [phone] [facility] nurse. Dionika

## 2020-02-10 DIAGNOSIS — E1122 Type 2 diabetes mellitus with diabetic chronic kidney disease: Secondary | ICD-10-CM | POA: Diagnosis not present

## 2020-02-10 DIAGNOSIS — I5033 Acute on chronic diastolic (congestive) heart failure: Secondary | ICD-10-CM | POA: Diagnosis not present

## 2020-02-10 DIAGNOSIS — S72142D Displaced intertrochanteric fracture of left femur, subsequent encounter for closed fracture with routine healing: Secondary | ICD-10-CM | POA: Diagnosis not present

## 2020-02-10 DIAGNOSIS — I130 Hypertensive heart and chronic kidney disease with heart failure and stage 1 through stage 4 chronic kidney disease, or unspecified chronic kidney disease: Secondary | ICD-10-CM | POA: Diagnosis not present

## 2020-02-10 DIAGNOSIS — D62 Acute posthemorrhagic anemia: Secondary | ICD-10-CM | POA: Diagnosis not present

## 2020-02-10 DIAGNOSIS — N183 Chronic kidney disease, stage 3 unspecified: Secondary | ICD-10-CM | POA: Diagnosis not present

## 2020-02-11 DIAGNOSIS — D62 Acute posthemorrhagic anemia: Secondary | ICD-10-CM | POA: Diagnosis not present

## 2020-02-11 DIAGNOSIS — N183 Chronic kidney disease, stage 3 unspecified: Secondary | ICD-10-CM | POA: Diagnosis not present

## 2020-02-11 DIAGNOSIS — S72142D Displaced intertrochanteric fracture of left femur, subsequent encounter for closed fracture with routine healing: Secondary | ICD-10-CM | POA: Diagnosis not present

## 2020-02-11 DIAGNOSIS — I130 Hypertensive heart and chronic kidney disease with heart failure and stage 1 through stage 4 chronic kidney disease, or unspecified chronic kidney disease: Secondary | ICD-10-CM | POA: Diagnosis not present

## 2020-02-11 DIAGNOSIS — I5033 Acute on chronic diastolic (congestive) heart failure: Secondary | ICD-10-CM | POA: Diagnosis not present

## 2020-02-11 DIAGNOSIS — E1122 Type 2 diabetes mellitus with diabetic chronic kidney disease: Secondary | ICD-10-CM | POA: Diagnosis not present

## 2020-02-14 ENCOUNTER — Emergency Department (HOSPITAL_COMMUNITY): Payer: Medicare Other

## 2020-02-14 ENCOUNTER — Other Ambulatory Visit: Payer: Self-pay

## 2020-02-14 ENCOUNTER — Inpatient Hospital Stay (HOSPITAL_COMMUNITY)
Admission: EM | Admit: 2020-02-14 | Discharge: 2020-02-17 | DRG: 291 | Disposition: A | Discharge status: Home Health | Payer: Medicare Other | Attending: Internal Medicine | Admitting: Internal Medicine

## 2020-02-14 ENCOUNTER — Encounter (HOSPITAL_COMMUNITY): Payer: Self-pay

## 2020-02-14 DIAGNOSIS — J9601 Acute respiratory failure with hypoxia: Secondary | ICD-10-CM | POA: Diagnosis present

## 2020-02-14 DIAGNOSIS — S72002A Fracture of unspecified part of neck of left femur, initial encounter for closed fracture: Secondary | ICD-10-CM | POA: Diagnosis not present

## 2020-02-14 DIAGNOSIS — J9811 Atelectasis: Secondary | ICD-10-CM | POA: Diagnosis not present

## 2020-02-14 DIAGNOSIS — I5033 Acute on chronic diastolic (congestive) heart failure: Secondary | ICD-10-CM | POA: Diagnosis present

## 2020-02-14 DIAGNOSIS — E1122 Type 2 diabetes mellitus with diabetic chronic kidney disease: Secondary | ICD-10-CM | POA: Diagnosis present

## 2020-02-14 DIAGNOSIS — Z952 Presence of prosthetic heart valve: Secondary | ICD-10-CM | POA: Diagnosis not present

## 2020-02-14 DIAGNOSIS — Z9841 Cataract extraction status, right eye: Secondary | ICD-10-CM | POA: Diagnosis not present

## 2020-02-14 DIAGNOSIS — E785 Hyperlipidemia, unspecified: Secondary | ICD-10-CM | POA: Diagnosis present

## 2020-02-14 DIAGNOSIS — I130 Hypertensive heart and chronic kidney disease with heart failure and stage 1 through stage 4 chronic kidney disease, or unspecified chronic kidney disease: Principal | ICD-10-CM | POA: Diagnosis present

## 2020-02-14 DIAGNOSIS — I517 Cardiomegaly: Secondary | ICD-10-CM | POA: Diagnosis not present

## 2020-02-14 DIAGNOSIS — Z801 Family history of malignant neoplasm of trachea, bronchus and lung: Secondary | ICD-10-CM

## 2020-02-14 DIAGNOSIS — I10 Essential (primary) hypertension: Secondary | ICD-10-CM | POA: Diagnosis not present

## 2020-02-14 DIAGNOSIS — N1832 Chronic kidney disease, stage 3b: Secondary | ICD-10-CM | POA: Diagnosis present

## 2020-02-14 DIAGNOSIS — Z20822 Contact with and (suspected) exposure to covid-19: Secondary | ICD-10-CM | POA: Diagnosis present

## 2020-02-14 DIAGNOSIS — Z955 Presence of coronary angioplasty implant and graft: Secondary | ICD-10-CM

## 2020-02-14 DIAGNOSIS — I110 Hypertensive heart disease with heart failure: Secondary | ICD-10-CM | POA: Diagnosis not present

## 2020-02-14 DIAGNOSIS — I2510 Atherosclerotic heart disease of native coronary artery without angina pectoris: Secondary | ICD-10-CM | POA: Diagnosis present

## 2020-02-14 DIAGNOSIS — Z825 Family history of asthma and other chronic lower respiratory diseases: Secondary | ICD-10-CM | POA: Diagnosis not present

## 2020-02-14 DIAGNOSIS — N184 Chronic kidney disease, stage 4 (severe): Secondary | ICD-10-CM | POA: Diagnosis present

## 2020-02-14 DIAGNOSIS — I509 Heart failure, unspecified: Secondary | ICD-10-CM | POA: Insufficient documentation

## 2020-02-14 DIAGNOSIS — Z96641 Presence of right artificial hip joint: Secondary | ICD-10-CM | POA: Diagnosis present

## 2020-02-14 DIAGNOSIS — I712 Thoracic aortic aneurysm, without rupture: Secondary | ICD-10-CM | POA: Diagnosis not present

## 2020-02-14 DIAGNOSIS — J189 Pneumonia, unspecified organism: Secondary | ICD-10-CM

## 2020-02-14 DIAGNOSIS — Z888 Allergy status to other drugs, medicaments and biological substances status: Secondary | ICD-10-CM | POA: Diagnosis not present

## 2020-02-14 DIAGNOSIS — Z9842 Cataract extraction status, left eye: Secondary | ICD-10-CM

## 2020-02-14 DIAGNOSIS — R0602 Shortness of breath: Secondary | ICD-10-CM | POA: Diagnosis not present

## 2020-02-14 DIAGNOSIS — R059 Cough, unspecified: Secondary | ICD-10-CM | POA: Diagnosis not present

## 2020-02-14 DIAGNOSIS — I6522 Occlusion and stenosis of left carotid artery: Secondary | ICD-10-CM | POA: Diagnosis present

## 2020-02-14 DIAGNOSIS — Z7982 Long term (current) use of aspirin: Secondary | ICD-10-CM | POA: Diagnosis not present

## 2020-02-14 DIAGNOSIS — Z7722 Contact with and (suspected) exposure to environmental tobacco smoke (acute) (chronic): Secondary | ICD-10-CM | POA: Diagnosis present

## 2020-02-14 DIAGNOSIS — Y95 Nosocomial condition: Secondary | ICD-10-CM | POA: Diagnosis present

## 2020-02-14 DIAGNOSIS — R32 Unspecified urinary incontinence: Secondary | ICD-10-CM | POA: Diagnosis present

## 2020-02-14 DIAGNOSIS — H409 Unspecified glaucoma: Secondary | ICD-10-CM | POA: Diagnosis present

## 2020-02-14 DIAGNOSIS — J90 Pleural effusion, not elsewhere classified: Secondary | ICD-10-CM | POA: Diagnosis not present

## 2020-02-14 DIAGNOSIS — Z79899 Other long term (current) drug therapy: Secondary | ICD-10-CM | POA: Diagnosis not present

## 2020-02-14 DIAGNOSIS — M810 Age-related osteoporosis without current pathological fracture: Secondary | ICD-10-CM | POA: Diagnosis present

## 2020-02-14 DIAGNOSIS — S72009A Fracture of unspecified part of neck of unspecified femur, initial encounter for closed fracture: Secondary | ICD-10-CM | POA: Diagnosis present

## 2020-02-14 DIAGNOSIS — N183 Chronic kidney disease, stage 3 unspecified: Secondary | ICD-10-CM | POA: Diagnosis present

## 2020-02-14 LAB — BLOOD GAS, VENOUS
Acid-Base Excess: 2 mmol/L (ref 0.0–2.0)
Bicarbonate: 27.3 mmol/L (ref 20.0–28.0)
FIO2: 21
O2 Saturation: 38.5 %
Patient temperature: 98.6
pCO2, Ven: 49.1 mmHg (ref 44.0–60.0)
pH, Ven: 7.365 (ref 7.250–7.430)
pO2, Ven: <31 mmHg — CL (ref 32.0–45.0)

## 2020-02-14 LAB — COMPREHENSIVE METABOLIC PANEL
ALT: 13 U/L (ref 0–44)
AST: 20 U/L (ref 15–41)
Albumin: 3.6 g/dL (ref 3.5–5.0)
Alkaline Phosphatase: 88 U/L (ref 38–126)
Anion gap: 10 (ref 5–15)
BUN: 34 mg/dL — ABNORMAL HIGH (ref 8–23)
CO2: 27 mmol/L (ref 22–32)
Calcium: 9.1 mg/dL (ref 8.9–10.3)
Chloride: 104 mmol/L (ref 98–111)
Creatinine, Ser: 1.26 mg/dL — ABNORMAL HIGH (ref 0.44–1.00)
GFR, Estimated: 40 mL/min — ABNORMAL LOW (ref >60)
Glucose, Bld: 140 mg/dL — ABNORMAL HIGH (ref 70–99)
Potassium: 4.1 mmol/L (ref 3.5–5.1)
Sodium: 141 mmol/L (ref 135–145)
Total Bilirubin: 0.8 mg/dL (ref 0.3–1.2)
Total Protein: 7.8 g/dL (ref 6.5–8.1)

## 2020-02-14 LAB — CBC WITH DIFFERENTIAL/PLATELET
Abs Immature Granulocytes: 0.04 10*3/uL (ref 0.00–0.07)
Basophils Absolute: 0 10*3/uL (ref 0.0–0.1)
Basophils Relative: 0 %
Eosinophils Absolute: 0.1 10*3/uL (ref 0.0–0.5)
Eosinophils Relative: 2 %
HCT: 34.3 % — ABNORMAL LOW (ref 36.0–46.0)
Hemoglobin: 10.7 g/dL — ABNORMAL LOW (ref 12.0–15.0)
Immature Granulocytes: 1 %
Lymphocytes Relative: 6 %
Lymphs Abs: 0.5 10*3/uL — ABNORMAL LOW (ref 0.7–4.0)
MCH: 31.1 pg (ref 26.0–34.0)
MCHC: 31.2 g/dL (ref 30.0–36.0)
MCV: 99.7 fL (ref 80.0–100.0)
Monocytes Absolute: 0.7 10*3/uL (ref 0.1–1.0)
Monocytes Relative: 8 %
Neutro Abs: 7 10*3/uL (ref 1.7–7.7)
Neutrophils Relative %: 83 %
Platelets: 250 10*3/uL (ref 150–400)
RBC: 3.44 MIL/uL — ABNORMAL LOW (ref 3.87–5.11)
RDW: 14.6 % (ref 11.5–15.5)
WBC: 8.4 10*3/uL (ref 4.0–10.5)
nRBC: 0 % (ref 0.0–0.2)

## 2020-02-14 LAB — TROPONIN I (HIGH SENSITIVITY)
Troponin I (High Sensitivity): 32 ng/L — ABNORMAL HIGH (ref <18)
Troponin I (High Sensitivity): 43 ng/L — ABNORMAL HIGH (ref <18)

## 2020-02-14 LAB — BRAIN NATRIURETIC PEPTIDE: B Natriuretic Peptide: 592.1 pg/mL — ABNORMAL HIGH (ref 0.0–100.0)

## 2020-02-14 LAB — RESPIRATORY PANEL BY RT PCR (FLU A&B, COVID)
Influenza A by PCR: NEGATIVE
Influenza B by PCR: NEGATIVE
SARS Coronavirus 2 by RT PCR: NEGATIVE

## 2020-02-14 MED ORDER — AMLODIPINE BESYLATE 5 MG PO TABS
7.5000 mg | ORAL_TABLET | Freq: Every day | ORAL | Status: DC
  Administered 2020-02-15 – 2020-02-17 (×3): 7.5 mg via ORAL
  Filled 2020-02-14 (×3): qty 2

## 2020-02-14 MED ORDER — LOPERAMIDE HCL 2 MG PO CAPS: 2.0000 mg | ORAL_CAPSULE | ORAL | Status: DC | PRN

## 2020-02-14 MED ORDER — VANCOMYCIN HCL 1250 MG/250ML IV SOLN
1250.0000 mg | Freq: Once | INTRAVENOUS | Status: DC
  Filled 2020-02-14: qty 250

## 2020-02-14 MED ORDER — FUROSEMIDE 10 MG/ML IJ SOLN
40.0000 mg | INTRAMUSCULAR | Status: AC
  Administered 2020-02-14: 40 mg via INTRAVENOUS
  Filled 2020-02-14: qty 4

## 2020-02-14 MED ORDER — EZETIMIBE 10 MG PO TABS
10.0000 mg | ORAL_TABLET | Freq: Every day | ORAL | Status: DC
  Administered 2020-02-14 – 2020-02-17 (×4): 10 mg via ORAL
  Filled 2020-02-14 (×4): qty 1

## 2020-02-14 MED ORDER — ASPIRIN 81 MG PO CHEW: 81.0000 mg | CHEWABLE_TABLET | Freq: Two times a day (BID) | ORAL | Status: DC

## 2020-02-14 MED ORDER — NITROGLYCERIN 2 % TD OINT: 1.0000 [in_us] | TOPICAL_OINTMENT | Freq: Once | TRANSDERMAL | Status: DC

## 2020-02-14 MED ORDER — HEPARIN SODIUM (PORCINE) 5000 UNIT/ML IJ SOLN
5000.0000 [IU] | Freq: Three times a day (TID) | INTRAMUSCULAR | Status: DC
  Administered 2020-02-14 – 2020-02-15 (×2): 5000 [IU] via SUBCUTANEOUS
  Filled 2020-02-14 (×2): qty 1

## 2020-02-14 MED ORDER — SODIUM CHLORIDE 0.9% FLUSH
3.0000 mL | Freq: Two times a day (BID) | INTRAVENOUS | Status: DC
  Administered 2020-02-15 – 2020-02-17 (×5): 3 mL via INTRAVENOUS

## 2020-02-14 MED ORDER — LATANOPROST 0.005 % OP SOLN
1.0000 [drp] | Freq: Every day | OPHTHALMIC | Status: DC
  Administered 2020-02-14 – 2020-02-16 (×3): 1 [drp] via OPHTHALMIC
  Filled 2020-02-14: qty 2.5

## 2020-02-14 MED ORDER — FUROSEMIDE 10 MG/ML IJ SOLN
40.0000 mg | Freq: Two times a day (BID) | INTRAMUSCULAR | Status: DC
  Administered 2020-02-14 – 2020-02-16 (×5): 40 mg via INTRAVENOUS
  Filled 2020-02-14 (×5): qty 4

## 2020-02-14 MED ORDER — SODIUM CHLORIDE 0.9 % IV SOLN
2.0000 g | Freq: Once | INTRAVENOUS | Status: DC
  Filled 2020-02-14: qty 2

## 2020-02-14 MED ORDER — SODIUM CHLORIDE (PF) 0.9 % IJ SOLN
INTRAMUSCULAR | Status: AC
  Filled 2020-02-14: qty 50

## 2020-02-14 MED ORDER — IOHEXOL 350 MG/ML SOLN
100.0000 mL | Freq: Once | INTRAVENOUS | Status: AC | PRN
  Administered 2020-02-14: 50 mL via INTRAVENOUS

## 2020-02-14 NOTE — ED Notes (Signed)
Pt transported to [facility] 

## 2020-02-14 NOTE — ED Notes (Signed)
RN

## 2020-02-14 NOTE — Progress Notes (Signed)
A consult was received from an ED physician (Little, R) for vancomycin per pharmacy dosing.  The patient's profile has been reviewed for ht/wt/allergies/indication/available labs.   A one time order has been placed for vancomycin 1250 mg IV x 1.  Further antibiotics/pharmacy consults should be ordered by admitting physician if indicated.                       Thank you, Efraim Kaufmann, PharmD, BCPS 02/14/2020  4:01 PM

## 2020-02-14 NOTE — ED Notes (Signed)
Date and time results received: 02/14/20 10:05 AM  Test: pO2 VBG Critical Value: <31/0  Name of Provider Notified: Little MD  Orders Received? Or Actions Taken?: Notified provider and RN

## 2020-02-14 NOTE — H&P (Signed)
History and Physical        Hospital Admission Note Date: 02/14/2020  Patient name: Sara Garza Medical record number: [id number] Date of birth: 01/31/1924 Age: 86 y.o. Gender: female  PCP: [name], NP  Patient coming from: home   Chief Complaint    Chief Complaint  Patient presents with  . Shortness of Breath      HPI:   This is a very pleasant 86 year old female with past medical history of CAD, HFpEF, hypertension, hyperlipidemia, type 2 diabetes, AS s/p [name], LBBB, CKD, recent left hip fracture s/p repair in September who presented to the ED with shortness of breath x3 days.  Has associated productive cough with a single episode of minor hemoptysis and bilateral lower extremity pitting edema. After her recent surgery she spent 2 weeks at a rehab facility where she started having increased lower extremity edema and so the facility increased her Lasix from 20 to 80 mg for a few days then went down to 40 mg daily.  She was discharged on 40 mg however about 10 days ago her PCP dropped it down to 20 mg daily.  A few days ago she began having increased shortness of breath and orthopnea requiring increased amount of pillows to sleep with cough that was mostly present at night.  Upon arrival, SPO2 on room air was 68% and was placed on NRB with improvement to 90s and switched to HFNC at 20.  Currently starting to feel better after the Lasix.  Daughter at bedside who has been staying with the patient lately since her stay at SNF states that patient had audible secretions and wheezes this morning with tachypnea prompting them to come to the ED.   ED Course: Afebrile, tachycardic, tachypneic,  hypoxic (SpO2 68%). Notable Labs: VBG: pH 7.3, PCO2 49, PO2 <31, bicarb 27.  BUN 34, creatinine 1.26, BNP 492, troponin 32-> 43. Notable Imaging: CXR with findings suggestive of CHF with basilar  opacities on the left not excluding concomitant pneumonia.  CTA chest without PE however with pleural effusions bilaterally and combination of atelectasis and compressive atelectasis in both lung bases and concern for superimposed pneumonia. Patient received Lasix 40 mg IV x1.    Vitals:   02/14/20 1630 02/14/20 1727  BP: (!) 139/59   Pulse: 86   Resp: (!) 22   Temp:    SpO2: 91% 95%     Review of Systems:  Review of Systems  Constitutional: Negative for chills and fever.  Cardiovascular: Negative for chest pain and palpitations.  All other systems reviewed and are negative.   Medical/Social/Family History   Past Medical History: Past Medical History:  Diagnosis Date  . Arthritis   . CAD (coronary artery disease)   . Carotid stenosis, left    50-60% stable  . Diabetes mellitus   . Glaucoma   . Hyperlipidemia   . Hypertension   . Leg pain    ABIs 2/18: normal bilaterally.  . Osteoporosis   . Valvular heart disease    Aortic Stenosis s/p [name] 2014 // Mitral stenosis // Echo 09/2018: EF > 65, mod LVH, severe focal basal hypertrophy, RVSP 39.8, mild to mod MR, mod to severe MS (mean 9),  AVR with mild AI, severe LAE (similar to prior echo)    Past Surgical History:  Procedure Laterality Date  . ANOMALOUS PULMONARY VENOUS RETURN REPAIR, TOTAL    . APPENDECTOMY    . CARDIAC VALVE REPLACEMENT  04/29/2012  . Cataracts Bilateral   . CESAREAN SECTION    . FOOT SURGERY     [name]  . HIP ARTHROPLASTY Right 07/31/2013   Procedure: ARTHROPLASTY BIPOLAR HIP;  Surgeon: [name], MD;  Location: [facility];  Service: Orthopedics;  Laterality: Right;  . INTRAMEDULLARY (IM) NAIL INTERTROCHANTERIC Left 01/01/2020   Procedure: INTRAMEDULLARY (IM) NAIL INTERTROCHANTRIC;  Surgeon: Rod Can, MD;  Location: [facility];  Service: Orthopedics;  Laterality: Left;  . PERCUTANEOUS CORONARY STENT INTERVENTION (PCI-S) N/A 01/02/2012   Procedure: PERCUTANEOUS CORONARY STENT INTERVENTION (PCI-S);   Surgeon: Sherren Mocha, MD;  Location: [facility] CATH LAB;  Service: Cardiovascular;  Laterality: N/A;    Medications: Prior to Admission medications   Medication Sig Start Date End Date Taking? Authorizing Provider  acetaminophen (TYLENOL) 500 MG tablet Take 2 tablets (1,000 mg total) by mouth 4 (four) times daily. 01/04/20  Yes Dwyane Dee, MD  amLODipine (NORVASC) 5 MG tablet TAKE 1 AND 1/2 TABLETS(7.5 MG) BY MOUTH DAILY Patient taking differently: Take 7.5 mg by mouth daily.  09/28/19  Yes Nafziger, [name], NP  aspirin (ASPIRIN CHILDRENS) 81 MG chewable tablet Chew 1 tablet (81 mg total) by mouth 2 (two) times daily with a meal. 01/03/20 02/17/20 Yes Swinteck, Aaron Edelman, MD  bimatoprost (LUMIGAN) 0.01 % SOLN Place 1 drop into both eyes at bedtime.    Yes [provider]  Calcium Carb-Cholecalciferol (CALCIUM 500 +D) 500-400 MG-UNIT TABS Take 1 tablet by mouth daily.    Yes [provider]  carvedilol (COREG) 3.125 MG tablet Take 3.125 mg by mouth 2 (two) times daily with a meal.   Yes [provider]  ezetimibe (ZETIA) 10 MG tablet TAKE 1 TABLET(10 MG) BY MOUTH DAILY Patient taking differently: Take 10 mg by mouth daily.  11/03/19  Yes Sherren Mocha, MD  furosemide (LASIX) 20 MG tablet Take 1 tablet (20 mg total) by mouth daily. 04/19/19 04/13/20 Yes Sherren Mocha, MD  isosorbide mononitrate (IMDUR) 60 MG 24 hr tablet TAKE 1 TABLET(60 MG) BY MOUTH DAILY Patient taking differently: Take 60 mg by mouth daily.  04/05/19  Yes Weaver, Scott T, PA-C  loperamide (IMODIUM) 2 MG capsule Take 2 mg by mouth as needed for diarrhea or loose stools.   Yes [provider]  losartan (COZAAR) 25 MG tablet TAKE 1 TABLET(25 MG) BY MOUTH DAILY Patient taking differently: Take 25 mg by mouth daily.  08/25/19  Yes Nafziger, [name], NP  Multiple Vitamin (MULTIVITAMIN) tablet Take 1 tablet by mouth daily.     Yes [provider]  Multiple Vitamins-Minerals (PRESERVISION AREDS 2+MULTI  VIT PO) Take 2 capsules by mouth in the morning and at bedtime.   Yes [provider]  carvedilol (COREG) 6.25 MG tablet Take 1 tablet (6.25 mg total) by mouth 2 (two) times daily with a meal. Patient not taking: Reported on 02/14/2020 01/04/20   Dwyane Dee, MD  oxybutynin (DITROPAN-XL) 5 MG 24 hr tablet TAKE 1 TABLET(5 MG) BY MOUTH AT BEDTIME Patient not taking: Reported on 02/14/2020 12/02/19   [name], NP    Allergies:   Allergies  Allergen Reactions  . Statins Other (See Comments)    Muscle aches  . Gabapentin Other (See Comments)    SOMNOLENCE  Social History:  reports that she is a non-smoker but has been exposed to tobacco smoke. She has never used smokeless tobacco. She reports current alcohol use. She reports that she does not use drugs.  Family History: Family History  Problem Relation Age of Onset  . COPD Father   . Cancer Father        lung cancer  . Lung cancer Other      Objective   Physical Exam: Blood pressure (!) 139/59, pulse 86, temperature 98 F (36.7 C), temperature source Oral, resp. rate (!) 22, weight 70.8 kg, SpO2 95 %.  Physical Exam Vitals and nursing note reviewed.  Constitutional:      Appearance: Normal appearance.  HENT:     Head: Normocephalic and atraumatic.  Eyes:     Conjunctiva/sclera: Conjunctivae normal.  Cardiovascular:     Rate and Rhythm: Normal rate and regular rhythm.     Comments: Orthopnea Pulmonary:     Effort: Pulmonary effort is normal.     Breath sounds: Rales present.     Comments: Diffuse rales On heated high flow Abdominal:     General: Abdomen is flat.     Palpations: Abdomen is soft.  Musculoskeletal:        General: No swelling or tenderness.  Skin:    Coloration: Skin is not jaundiced or pale.  Neurological:     Mental Status: She is alert. Mental status is at baseline.  Psychiatric:        Mood and Affect: Mood normal.        Behavior: Behavior normal.     LABS on Admission:  I have personally reviewed all the labs and imaging below    Basic Metabolic Panel: Recent Labs  Lab 02/14/20 0914  NA 141  K 4.1  CL 104  CO2 27  GLUCOSE 140*  BUN 34*  CREATININE 1.26*  CALCIUM 9.1   Liver Function Tests: Recent Labs  Lab 02/14/20 0914  AST 20  ALT 13  ALKPHOS 88  BILITOT 0.8  PROT 7.8  ALBUMIN 3.6   No results for input(s): LIPASE, AMYLASE in the last 168 hours. No results for input(s): AMMONIA in the last 168 hours. CBC: Recent Labs  Lab 02/14/20 0914  WBC 8.4  NEUTROABS 7.0  HGB 10.7*  HCT 34.3*  MCV 99.7  PLT 250   Cardiac Enzymes: No results for input(s): CKTOTAL, CKMB, CKMBINDEX, TROPONINI in the last 168 hours. BNP: Invalid input(s): POCBNP CBG: No results for input(s): GLUCAP in the last 168 hours.  Radiological Exams on Admission:  CT Angio Chest PE W/Cm &/Or Wo Cm  Result Date: 02/14/2020 CLINICAL DATA:  Shortness of breath and cough. EXAM: CT ANGIOGRAPHY CHEST WITH CONTRAST TECHNIQUE: Multidetector CT imaging of the chest was performed using the standard protocol during bolus administration of intravenous contrast. Multiplanar CT image reconstructions and MIPs were obtained to evaluate the vascular anatomy. CONTRAST:  96mL OMNIPAQUE IOHEXOL 350 MG/ML SOLN COMPARISON:  Chest radiograph February 14, 2020 FINDINGS: Cardiovascular: There is no demonstrable pulmonary embolus. There is no appreciable thoracic aortic aneurysm. No dissection evident. Note that the contrast bolus in the aorta is not sufficient for confident dissection assessment. There are scattered foci of calcification in visualized great vessels. There are foci of aortic atherosclerosis. Patient is status post aortic valve replacement. There are multiple foci of coronary artery calcification. There is extensive calcification of the mitral annulus. There is no pericardial effusion or pericardial thickening. Mediastinum/Nodes: The visualized thyroid appears normal.  No  appreciable thoracic adenopathy. No esophageal lesions are appreciable. Lungs/Pleura: There are moderate free-flowing pleural effusions with compressive atelectasis and patchy consolidation in the lung bases. Elsewhere, there are patchy areas of airspace opacity in the mid lung regions as well areas of somewhat mosaic attenuation bilaterally. No appreciable interstitial edema evident. Upper Abdomen: Visualized upper abdominal structures appear unremarkable except for aortic atherosclerosis. Musculoskeletal: There are foci of degenerative change in the thoracic spine. No blastic or lytic bone lesions. No evident chest wall lesions. Review of the MIP images confirms the above findings. IMPRESSION: 1. No demonstrable pulmonary embolus. No thoracic aortic aneurysm. No dissection evident; note that the contrast bolus in the aorta is not sufficient for confident dissection assessment. There is aortic atherosclerosis as well as great vessel and coronary artery calcification. There is mitral annulus calcification. Patient is status post aortic valve replacement. 2. Pleural effusions bilaterally with combination of atelectasis and compressive atelectasis in both lung bases. Scattered areas of ill-defined airspace opacity bilaterally as well as areas of mosaic attenuation. It is difficult to ascertain whether these areas represent foci of pneumonia versus a degree of pulmonary edema. Both entities may be present concurrently. There is concern for potential degree of congestive heart failure with superimposed pneumonia. The mosaic attenuation the lungs may indicate a degree of superimposed small airways obstructive disease. 3.  No adenopathy. Aortic Atherosclerosis (ICD10-I70.0). Electronically Signed   By: Lowella Grip III M.D.   On: 02/14/2020 14:38   DG Chest Portable 1 View  Result Date: 02/14/2020 CLINICAL DATA:  Shortness of breath, CHF. EXAM: PORTABLE CHEST 1 VIEW COMPARISON:  12/30/2019 FINDINGS: Low lung  volumes.  Cardiomegaly as on the prior study. Signs of trans arterial aortic valve replacement. Increased interstitial markings bilaterally. LEFT lower lobe airspace disease with partially obscured lateral LEFT hemidiaphragm. On limited assessment no acute musculoskeletal finding. IMPRESSION: Low volume chest with findings suggestive of CHF or volume overload. Basilar opacities on the LEFT do not allow for exclusion of concomitant pneumonia. Electronically Signed   By: Zetta Bills M.D.   On: 02/14/2020 09:07      EKG: LBBB   A & P   Principal Problem:   Acute on chronic diastolic CHF (congestive heart failure) (HCC) Active Problems:   Dyslipidemia   CKD (chronic kidney disease), stage III (HCC)   Hip fracture (HCC)   Benign essential HTN   1. Acute hypoxemic respiratory failure secondary to CHF exacerbation along with atelectasis a. Requiring 20 LPM HFNC (on room air at baseline), BNP 592 b. Suspect diastolic heart failure exacerbation c. Recently had her Lasix dose decreased which is likely the inciting event d. CTA chest negative for PE and concerning for possible superimposed pneumonia e. Afebrile without leukocytosis -> will hold antibiotics for now despite imaging f. Continue with Lasix 40 mg IV twice daily g. Monitor daily weights and intake/output  2. CKD 3b, at baseline  3. Recent left hip fracture a. No PE on chest imaging b. Continue aspirin twice daily c. PT eval  4. Hyperlipidemia a. Continue home meds  5. Hypertension a. Hold home losartan and beta-blocker in setting of heart failure b. Continue amlodipine   6. Episode of minor hemoptysis, likely from coughing spells a. Hb stable b. Continue to monitor  7. Elevated troponin a. Troponin flat at 32-> 43 in the setting of CKD and heart failure b. Continue telemetry   DVT prophylaxis: heparin injection 5,000 Units Start: 02/14/20 2200   heparin injection 5,000 Units Start:  02/14/20 2200Heparin   Code  Status: Full Code  Diet: Heart healthy Family Communication: Admission, patients condition and plan of care including tests being ordered have been discussed with the patient who indicates understanding and agrees with the plan and Code Status. Patient's daughter was updated  Disposition Plan: The appropriate patient status for this patient is INPATIENT. Inpatient status is judged to be reasonable and necessary in order to provide the required intensity of service to ensure the patient's safety. The patient's presenting symptoms, physical exam findings, and initial radiographic and laboratory data in the context of their chronic comorbidities is felt to place them at high risk for further clinical deterioration. Furthermore, it is not anticipated that the patient will be medically stable for discharge from the hospital within 2 midnights of admission. The following factors support the patient status of inpatient.   " The patient's presenting symptoms include shortness of breath, cough. " The worrisome physical exam findings include diffuse rales, high O2 needs. " The initial radiographic and laboratory data are worrisome because of heart failure. " The chronic co-morbidities include diastolic heart failure.   * I certify that at the point of admission it is my clinical judgment that the patient will require inpatient hospital care spanning beyond 2 midnights from the point of admission due to high intensity of service, high risk for further deterioration and high frequency of surveillance required.*   The medical decision making on this patient was of high complexity and the patient is at high risk for clinical deterioration, therefore this is a level 3 admission.  Consultants  . None  Procedures  . None  Time Spent on Admission: 70 minutes    Harold Hedge, DO [facility]  02/14/2020, 6:17 PM

## 2020-02-14 NOTE — ED Notes (Signed)
Report given to [place], Therapist, sports.  SBAR information covered in detail with receiving nurse.  No additional questions asked at this time.  PT resting quietly in bed.  NADN.

## 2020-02-14 NOTE — ED Notes (Signed)
Pts daughter at bedside  

## 2020-02-14 NOTE — ED Provider Notes (Signed)
[facility] DEPT Provider Note   CSN: [id number] Arrival date & time: 02/14/20  0820     History Chief Complaint  Patient presents with  . Shortness of Breath    Sara Garza is a 86 y.o. female.  86yo F w/ PMH below including CAD, CHF, HTN, HLD, T2DM, aortic stenosis s/p [name], LBBB, CKD who p/w shortness of breath. At the end of September, pt broke her L hip and was hospitalized for repair. She then spent 2 weeks at a rehab facility, where she started having increased LE edema so the facility increased lasix from 20mg  to 80mg  for a few days and then went back down to 40mg  daily. She was discharged home on 40mg ; 10 days ago, PCP dropped it down to 20mg  daily. Daughter notes LE edema has been better lately and weight has been stable. A few days ago, pt began having increased SOB and orthopnea with cough that was mostly present at night. Symptoms have progressed and SOB was severe this morning, prompting them to come to ED. She denies any sore throat, nasal congestion, V/D, fevers, or sick contacts. Her left leg has been slightly more swollen than right since her hip fracture/surgery. She endorses nausea, denies CP. No hx asthma/COPD.  The history is provided by the patient.  Shortness of Breath      Past Medical History:  Diagnosis Date  . Arthritis   . CAD (coronary artery disease)   . Carotid stenosis, left    50-60% stable  . Diabetes mellitus   . Glaucoma   . Hyperlipidemia   . Hypertension   . Leg pain    ABIs 2/18: normal bilaterally.  . Osteoporosis   . Valvular heart disease    Aortic Stenosis s/p [name] 2014 // Mitral stenosis // Echo 09/2018: EF > 65, mod LVH, severe focal basal hypertrophy, RVSP 39.8, mild to mod MR, mod to severe MS (mean 9), AVR with mild AI, severe LAE (similar to prior echo)    Patient Active Problem List   Diagnosis Date Noted  . Acute blood loss anemia 01/03/2020  . Eye irritation 01/01/2020  . Mitral valve  stenosis   . Benign essential HTN   . Chronic diastolic CHF (congestive heart failure) ([place])   . Hip fracture ([place]) 12/30/2019  . Closed fracture of femur, intertrochanteric, left, initial encounter ([place])   . Acute on chronic diastolic CHF (congestive heart failure) ([place]) 02/25/2018  . S/P [name] (transcatheter aortic valve replacement) 02/25/2018  . Leg pain   . Hyperlipidemia   . Glaucoma   . Carotid stenosis, left   . Arthritis   . Urinary incontinence 02/12/2017  . Chronic bilateral low back pain 08/12/2016  . Spondylosis of lumbar joint 08/12/2016  . Bilateral leg pain 08/12/2016  . Neck mass 04/15/2015  . Status post hip hemiarthroplasty 07/31/2013  . CAD (coronary artery disease) 07/31/2013  . CKD (chronic kidney disease), stage III ([place]) 07/31/2013  . Left bundle branch block 07/31/2013  . Severe calcific aortic stenosis 06/18/2010  . Diabetic polyneuropathy ([place]) 04/16/2010  . GLAUCOMA 01/06/2009  . CHEST PAIN, ATYPICAL 12/16/2008  . Bilateral shoulder pain 11/16/2007  . OSTEOARTHRITIS 02/02/2007  . Type 2 diabetes mellitus with renal manifestations, controlled ([place]) 10/24/2006  . Dyslipidemia 10/02/2006  . Essential hypertension 10/02/2006    Past Surgical History:  Procedure Laterality Date  . ANOMALOUS PULMONARY VENOUS RETURN REPAIR, TOTAL    . APPENDECTOMY    . CARDIAC VALVE REPLACEMENT  04/29/2012  .  Cataracts Bilateral   . CESAREAN SECTION    . FOOT SURGERY     [name]  . HIP ARTHROPLASTY Right 07/31/2013   Procedure: ARTHROPLASTY BIPOLAR HIP;  Surgeon: [name], MD;  Location: [facility] ORS;  Service: Orthopedics;  Laterality: Right;  . INTRAMEDULLARY (IM) NAIL INTERTROCHANTERIC Left 01/01/2020   Procedure: INTRAMEDULLARY (IM) NAIL INTERTROCHANTRIC;  Surgeon: Rod Can, MD;  Location: [facility] ORS;  Service: Orthopedics;  Laterality: Left;  . PERCUTANEOUS CORONARY STENT INTERVENTION (PCI-S) N/A 01/02/2012   Procedure: PERCUTANEOUS CORONARY STENT INTERVENTION  (PCI-S);  Surgeon: Sherren Mocha, MD;  Location: [facility] CATH LAB;  Service: Cardiovascular;  Laterality: N/A;     OB History   No obstetric history on file.     Family History  Problem Relation Age of Onset  . COPD Father   . Cancer Father        lung cancer  . Lung cancer Other     Social History   Tobacco Use  . Smoking status: Passive Smoke Exposure - Never Smoker  . Smokeless tobacco: Never Used  Substance Use Topics  . Alcohol use: Yes    Alcohol/week: 0.0 standard drinks    Comment: very seldom  . Drug use: No    Home Medications Prior to Admission medications   Medication Sig Start Date End Date Taking? Authorizing Provider  acetaminophen (TYLENOL) 500 MG tablet Take 2 tablets (1,000 mg total) by mouth 4 (four) times daily. 01/04/20  Yes Dwyane Dee, MD  amLODipine (NORVASC) 5 MG tablet TAKE 1 AND 1/2 TABLETS(7.5 MG) BY MOUTH DAILY Patient taking differently: Take 7.5 mg by mouth daily.  09/28/19  Yes Nafziger, [name], NP  aspirin (ASPIRIN CHILDRENS) 81 MG chewable tablet Chew 1 tablet (81 mg total) by mouth 2 (two) times daily with a meal. 01/03/20 02/17/20 Yes Swinteck, Aaron Edelman, MD  bimatoprost (LUMIGAN) 0.01 % SOLN Place 1 drop into both eyes at bedtime.    Yes [provider]  Calcium Carb-Cholecalciferol (CALCIUM 500 +D) 500-400 MG-UNIT TABS Take 1 tablet by mouth daily.    Yes [provider]  carvedilol (COREG) 3.125 MG tablet Take 3.125 mg by mouth 2 (two) times daily with a meal.   Yes [provider]  ezetimibe (ZETIA) 10 MG tablet TAKE 1 TABLET(10 MG) BY MOUTH DAILY Patient taking differently: Take 10 mg by mouth daily.  11/03/19  Yes Sherren Mocha, MD  furosemide (LASIX) 20 MG tablet Take 1 tablet (20 mg total) by mouth daily. 04/19/19 04/13/20 Yes Sherren Mocha, MD  isosorbide mononitrate (IMDUR) 60 MG 24 hr tablet TAKE 1 TABLET(60 MG) BY MOUTH DAILY Patient taking differently: Take 60 mg by mouth daily.  04/05/19  Yes Weaver, Scott T,  PA-C  loperamide (IMODIUM) 2 MG capsule Take 2 mg by mouth as needed for diarrhea or loose stools.   Yes [provider]  losartan (COZAAR) 25 MG tablet TAKE 1 TABLET(25 MG) BY MOUTH DAILY Patient taking differently: Take 25 mg by mouth daily.  08/25/19  Yes Nafziger, [name], NP  Multiple Vitamin (MULTIVITAMIN) tablet Take 1 tablet by mouth daily.     Yes [provider]  Multiple Vitamins-Minerals (PRESERVISION AREDS 2+MULTI VIT PO) Take 2 capsules by mouth in the morning and at bedtime.   Yes [provider]  carvedilol (COREG) 6.25 MG tablet Take 1 tablet (6.25 mg total) by mouth 2 (two) times daily with a meal. Patient not taking: Reported on 02/14/2020 01/04/20   Dwyane Dee, MD  oxybutynin (DITROPAN-XL) 5  MG 24 hr tablet TAKE 1 TABLET(5 MG) BY MOUTH AT BEDTIME Patient not taking: Reported on 02/14/2020 12/02/19   [name], NP    Allergies    Statins and Gabapentin  Review of Systems   Review of Systems  Respiratory: Positive for shortness of breath.    All other systems reviewed and are negative except that which was mentioned in HPI  Physical Exam Updated Vital Signs BP 126/80   Pulse 91   Temp 98 F (36.7 C) (Oral)   Resp 19   Wt 70.8 kg   SpO2 97%   BMI 33.76 kg/m   Physical Exam Vitals and nursing note reviewed.  Constitutional:      Appearance: She is well-developed.     Comments: Awake, alert, non-toxic, mild respiratory distress  HENT:     Head: Normocephalic and atraumatic.  Eyes:     Conjunctiva/sclera: Conjunctivae normal.  Cardiovascular:     Rate and Rhythm: Regular rhythm. Tachycardia present.     Heart sounds: Normal heart sounds. No murmur heard.   Pulmonary:     Effort: Tachypnea and respiratory distress (mild) present.     Breath sounds: Examination of the right-upper field reveals wheezing. Examination of the left-upper field reveals wheezing. Examination of the right-lower field reveals rales. Examination of the  left-lower field reveals rales. Wheezing and rales present.  Abdominal:     General: Bowel sounds are normal. There is no distension.     Palpations: Abdomen is soft.     Tenderness: There is no abdominal tenderness.  Musculoskeletal:     Cervical back: Neck supple.     Right lower leg: Edema present.     Left lower leg: Tenderness present. Edema present.     Comments: Mild tenderness L lower ext, mild b/l LE edema L>R  Skin:    General: Skin is warm and dry.  Neurological:     Mental Status: She is alert and oriented to person, place, and time.     Comments: Fluent speech  Psychiatric:        Mood and Affect: Mood normal.        Judgment: Judgment normal.     ED Results / Procedures / Treatments   Labs (all labs ordered are listed, but only abnormal results are displayed) Labs Reviewed  COMPREHENSIVE METABOLIC PANEL - Abnormal; Notable for the following components:      Result Value   Glucose, Bld 140 (*)    BUN 34 (*)    Creatinine, Ser 1.26 (*)    GFR, Estimated 40 (*)    All other components within normal limits  CBC WITH DIFFERENTIAL/PLATELET - Abnormal; Notable for the following components:   RBC 3.44 (*)    Hemoglobin 10.7 (*)    HCT 34.3 (*)    Lymphs Abs 0.5 (*)    All other components within normal limits  BRAIN NATRIURETIC PEPTIDE - Abnormal; Notable for the following components:   B Natriuretic Peptide 592.1 (*)    All other components within normal limits  BLOOD GAS, VENOUS - Abnormal; Notable for the following components:   pO2, Ven <31.0 (*)    All other components within normal limits  TROPONIN I (HIGH SENSITIVITY) - Abnormal; Notable for the following components:   Troponin I (High Sensitivity) 32 (*)    All other components within normal limits  TROPONIN I (HIGH SENSITIVITY) - Abnormal; Notable for the following components:   Troponin I (High Sensitivity) 43 (*)    All  other components within normal limits  RESPIRATORY PANEL BY RT PCR (FLU A&B,  COVID)  CULTURE, BLOOD (ROUTINE X 2)  CULTURE, BLOOD (ROUTINE X 2)    EKG EKG Interpretation  Date/Time:  Monday February 14 2020 08:28:22 EST Ventricular Rate:  107 PR Interval:    QRS Duration: 146 QT Interval:  379 QTC Calculation: 506 R Axis:   -33 Text Interpretation: Sinus tachycardia Left bundle branch block rate faster but otherwise similar to previous Confirmed by Theotis Burrow [phone]) on 02/14/2020 8:30:15 AM   Radiology CT Angio Chest PE W/Cm &/Or Wo Cm  Result Date: 02/14/2020 CLINICAL DATA:  Shortness of breath and cough. EXAM: CT ANGIOGRAPHY CHEST WITH CONTRAST TECHNIQUE: Multidetector CT imaging of the chest was performed using the standard protocol during bolus administration of intravenous contrast. Multiplanar CT image reconstructions and MIPs were obtained to evaluate the vascular anatomy. CONTRAST:  74mL OMNIPAQUE IOHEXOL 350 MG/ML SOLN COMPARISON:  Chest radiograph February 14, 2020 FINDINGS: Cardiovascular: There is no demonstrable pulmonary embolus. There is no appreciable thoracic aortic aneurysm. No dissection evident. Note that the contrast bolus in the aorta is not sufficient for confident dissection assessment. There are scattered foci of calcification in visualized great vessels. There are foci of aortic atherosclerosis. Patient is status post aortic valve replacement. There are multiple foci of coronary artery calcification. There is extensive calcification of the mitral annulus. There is no pericardial effusion or pericardial thickening. Mediastinum/Nodes: The visualized thyroid appears normal. No appreciable thoracic adenopathy. No esophageal lesions are appreciable. Lungs/Pleura: There are moderate free-flowing pleural effusions with compressive atelectasis and patchy consolidation in the lung bases. Elsewhere, there are patchy areas of airspace opacity in the mid lung regions as well areas of somewhat mosaic attenuation bilaterally. No appreciable interstitial  edema evident. Upper Abdomen: Visualized upper abdominal structures appear unremarkable except for aortic atherosclerosis. Musculoskeletal: There are foci of degenerative change in the thoracic spine. No blastic or lytic bone lesions. No evident chest wall lesions. Review of the MIP images confirms the above findings. IMPRESSION: 1. No demonstrable pulmonary embolus. No thoracic aortic aneurysm. No dissection evident; note that the contrast bolus in the aorta is not sufficient for confident dissection assessment. There is aortic atherosclerosis as well as great vessel and coronary artery calcification. There is mitral annulus calcification. Patient is status post aortic valve replacement. 2. Pleural effusions bilaterally with combination of atelectasis and compressive atelectasis in both lung bases. Scattered areas of ill-defined airspace opacity bilaterally as well as areas of mosaic attenuation. It is difficult to ascertain whether these areas represent foci of pneumonia versus a degree of pulmonary edema. Both entities may be present concurrently. There is concern for potential degree of congestive heart failure with superimposed pneumonia. The mosaic attenuation the lungs may indicate a degree of superimposed small airways obstructive disease. 3.  No adenopathy. Aortic Atherosclerosis (ICD10-I70.0). Electronically Signed   By: Lowella Grip III M.D.   On: 02/14/2020 14:38   DG Chest Portable 1 View  Result Date: 02/14/2020 CLINICAL DATA:  Shortness of breath, CHF. EXAM: PORTABLE CHEST 1 VIEW COMPARISON:  12/30/2019 FINDINGS: Low lung volumes.  Cardiomegaly as on the prior study. Signs of trans arterial aortic valve replacement. Increased interstitial markings bilaterally. LEFT lower lobe airspace disease with partially obscured lateral LEFT hemidiaphragm. On limited assessment no acute musculoskeletal finding. IMPRESSION: Low volume chest with findings suggestive of CHF or volume overload. Basilar  opacities on the LEFT do not allow for exclusion of concomitant pneumonia. Electronically Signed  By: Zetta Bills M.D.   On: 02/14/2020 09:07    Procedures .Critical Care Performed by: Sharlett Iles, MD Authorized by: Sharlett Iles, MD   Critical care provider statement:    Critical care time (minutes):  40   Critical care time was exclusive of:  Separately billable procedures and treating other patients   Critical care was necessary to treat or prevent imminent or life-threatening deterioration of the following conditions:  Respiratory failure   Critical care was time spent personally by me on the following activities:  Development of treatment plan with patient or surrogate, evaluation of patient's response to treatment, obtaining history from patient or surrogate, ordering and performing treatments and interventions, ordering and review of laboratory studies, ordering and review of radiographic studies and re-evaluation of patient's condition   (including critical care time)  Medications Ordered in ED Medications  nitroGLYCERIN (NITROGLYN) 2 % ointment 1 inch (1 inch Topical Not Given 02/14/20 1011)  sodium chloride (PF) 0.9 % injection (  Not Given 02/14/20 1446)  ceFEPIme (MAXIPIME) 1 g in sodium chloride 0.9 % 100 mL IVPB (has no administration in time range)  furosemide (LASIX) injection 40 mg (40 mg Intravenous Given 02/14/20 1010)  iohexol (OMNIPAQUE) 350 MG/ML injection 100 mL (50 mLs Intravenous Contrast Given 02/14/20 1409)    ED Course  I have reviewed the triage vital signs and the nursing notes.  Pertinent labs & imaging results that were available during my care of the patient were reviewed by me and considered in my medical decision making (see chart for details).    MDM Rules/Calculators/A&P                          PT in mild resp distress but able to speak and mentating normally. 68% initially on RA; placed on NRB with improvement to 90s. VBG  reassuring, eventually switched to high flow [place] at 20. CXR suggestive of CHF, +/- superimposed pneumonia. Gave NTG paste and 40mg  IV lasix; daughter had given 40mg  PO lasix prior to arrival.  LAbs show Cr 1.26,  WBC normal, BNP 592, COVID negative. Obtained CTA chest which was negative for PE but shows b/l pleural effusions plus possible infiltrates. Added blood cultures and gave Vanc and cefepime for HCAP coverage.   PT has remained stable on HFNC and VS improved. DIscussed admission w/ [facility], Dr. Neysa Bonito, who will admit for further care.  Final Clinical Impression(s) / ED Diagnoses Final diagnoses:  Acute on chronic congestive heart failure, unspecified heart failure type ([place])  HCAP (healthcare-associated pneumonia)  Acute respiratory failure with hypoxia [facility])    Rx / DC Orders ED Discharge Orders    None       Mitzi Lilja, Wenda Overland, MD 02/14/20 1512

## 2020-02-14 NOTE — Plan of Care (Signed)

## 2020-02-14 NOTE — ED Notes (Signed)
Hourly rounding complete. Pt provided with meal tray. Vitals updated. All needs met at this time. Will continue to monitor.

## 2020-02-14 NOTE — Progress Notes (Signed)
TOC CM notified [facility] of admission. [place], [place] ED TOC CM [phone]

## 2020-02-14 NOTE — ED Triage Notes (Addendum)
Pt arrived via walk in c/o SOB, x3 days days. Hx of CHF. Bilateral pitting edema. Also c/o productive cough.  States she took 40 lasix this morning, which is double her normal dose.   Spo2 RA 68%.

## 2020-02-15 LAB — BASIC METABOLIC PANEL
Anion gap: 9 (ref 5–15)
BUN: 31 mg/dL — ABNORMAL HIGH (ref 8–23)
CO2: 30 mmol/L (ref 22–32)
Calcium: 8.4 mg/dL — ABNORMAL LOW (ref 8.9–10.3)
Chloride: 100 mmol/L (ref 98–111)
Creatinine, Ser: 1.08 mg/dL — ABNORMAL HIGH (ref 0.44–1.00)
GFR, Estimated: 48 mL/min — ABNORMAL LOW (ref >60)
Glucose, Bld: 87 mg/dL (ref 70–99)
Potassium: 3.5 mmol/L (ref 3.5–5.1)
Sodium: 139 mmol/L (ref 135–145)

## 2020-02-15 LAB — CBC
HCT: 32.3 % — ABNORMAL LOW (ref 36.0–46.0)
Hemoglobin: 10 g/dL — ABNORMAL LOW (ref 12.0–15.0)
MCH: 30.6 pg (ref 26.0–34.0)
MCHC: 31 g/dL (ref 30.0–36.0)
MCV: 98.8 fL (ref 80.0–100.0)
Platelets: 229 10*3/uL (ref 150–400)
RBC: 3.27 MIL/uL — ABNORMAL LOW (ref 3.87–5.11)
RDW: 14.4 % (ref 11.5–15.5)
WBC: 6.4 10*3/uL (ref 4.0–10.5)
nRBC: 0 % (ref 0.0–0.2)

## 2020-02-15 MED ORDER — ENOXAPARIN SODIUM 30 MG/0.3ML ~~LOC~~ SOLN
30.0000 mg | SUBCUTANEOUS | Status: DC
  Administered 2020-02-16 – 2020-02-17 (×2): 30 mg via SUBCUTANEOUS
  Filled 2020-02-15 (×3): qty 0.3

## 2020-02-15 MED ORDER — POTASSIUM CHLORIDE CRYS ER 20 MEQ PO TBCR
40.0000 meq | EXTENDED_RELEASE_TABLET | Freq: Two times a day (BID) | ORAL | Status: DC
  Administered 2020-02-15 – 2020-02-16 (×3): 40 meq via ORAL
  Filled 2020-02-15 (×3): qty 2

## 2020-02-15 MED ORDER — ASPIRIN 81 MG PO CHEW: 81.0000 mg | CHEWABLE_TABLET | Freq: Every day | ORAL | Status: DC

## 2020-02-15 MED ORDER — ASPIRIN 81 MG PO CHEW
81.0000 mg | CHEWABLE_TABLET | Freq: Every day | ORAL | Status: DC
  Administered 2020-02-15 – 2020-02-17 (×3): 81 mg via ORAL
  Filled 2020-02-15 (×3): qty 1

## 2020-02-15 NOTE — Plan of Care (Signed)
  Problem: Education: Goal: Knowledge of General Education information will improve Description: Including pain rating scale, medication(s)/side effects and non-pharmacologic comfort measures 02/15/2020 0306 by Glenna Fellows D, RN Outcome: Progressing 02/14/2020 2107 by Glenna Fellows D, RN Outcome: Progressing   Problem: Health Behavior/Discharge Planning: Goal: Ability to manage health-related needs will improve 02/15/2020 0306 by Glenna Fellows D, RN Outcome: Progressing 02/14/2020 2107 by Glenna Fellows D, RN Outcome: Progressing   Problem: Clinical Measurements: Goal: Ability to maintain clinical measurements within normal limits will improve 02/15/2020 0306 by Glenna Fellows D, RN Outcome: Progressing 02/14/2020 2107 by Glenna Fellows D, RN Outcome: Progressing Goal: Will remain free from infection 02/15/2020 0306 by Glenna Fellows D, RN Outcome: Progressing 02/14/2020 2107 by Glenna Fellows D, RN Outcome: Progressing Goal: Diagnostic test results will improve 02/15/2020 0306 by Glenna Fellows D, RN Outcome: Progressing 02/14/2020 2107 by Glenna Fellows D, RN Outcome: Progressing Goal: Respiratory complications will improve 02/15/2020 0306 by Glenna Fellows D, RN Outcome: Progressing 02/14/2020 2107 by Glenna Fellows D, RN Outcome: Progressing Goal: Cardiovascular complication will be avoided 02/15/2020 0306 by Glenna Fellows D, RN Outcome: Progressing 02/14/2020 2107 by Glenna Fellows D, RN Outcome: Progressing   Problem: Activity: Goal: Risk for activity intolerance will decrease 02/15/2020 0306 by Glenna Fellows D, RN Outcome: Progressing 02/14/2020 2107 by Glenna Fellows D, RN Outcome: Progressing   Problem: Nutrition: Goal: Adequate nutrition will be maintained 02/15/2020 0306 by Glenna Fellows D, RN Outcome: Progressing 02/14/2020 2107 by Glenna Fellows D, RN Outcome: Progressing   Problem: Coping: Goal: Level of anxiety will decrease 02/15/2020  0306 by Glenna Fellows D, RN Outcome: Progressing 02/14/2020 2107 by Glenna Fellows D, RN Outcome: Progressing   Problem: Elimination: Goal: Will not experience complications related to bowel motility 02/15/2020 0306 by Glenna Fellows D, RN Outcome: Progressing 02/14/2020 2107 by Glenna Fellows D, RN Outcome: Progressing Goal: Will not experience complications related to urinary retention 02/15/2020 0306 by Tula Nakayama, RN Outcome: Progressing 02/14/2020 2107 by Glenna Fellows D, RN Outcome: Progressing   Problem: Pain Managment: Goal: General experience of comfort will improve 02/15/2020 0306 by Tula Nakayama, RN Outcome: Progressing 02/14/2020 2107 by Glenna Fellows D, RN Outcome: Progressing   Problem: Safety: Goal: Ability to remain free from injury will improve 02/15/2020 0306 by Tula Nakayama, RN Outcome: Progressing 02/14/2020 2107 by Glenna Fellows D, RN Outcome: Progressing   Problem: Skin Integrity: Goal: Risk for impaired skin integrity will decrease 02/15/2020 0306 by Glenna Fellows D, RN Outcome: Progressing 02/14/2020 2107 by Glenna Fellows D, RN Outcome: Progressing   Problem: Education: Goal: Ability to demonstrate management of disease process will improve Outcome: Progressing Goal: Ability to verbalize understanding of medication therapies will improve Outcome: Progressing Goal: Individualized Educational Video(s) Outcome: Progressing   Problem: Activity: Goal: Capacity to carry out activities will improve Outcome: Progressing   Problem: Cardiac: Goal: Ability to achieve and maintain adequate cardiopulmonary perfusion will improve Outcome: Progressing

## 2020-02-15 NOTE — Progress Notes (Addendum)
PROGRESS NOTE  Sara Garza WFU:[id number] DOB: 1923-12-31 DOA: 02/14/2020 PCP: [name], NP  HPI/Recap of past 24 hours: This is a very pleasant 86 year old female with past medical history of CAD, HFpEF, hypertension, hyperlipidemia, type 2 diabetes, AS s/p [name], LBBB, CKD, recent left hip fracture s/p repair in September who presented to the ED with shortness of breath x3 days.  Has associated productive cough with a single episode of minor hemoptysis and bilateral lower extremity pitting edema. After her recent surgery she spent 2 weeks at a rehab facility where she started having increased lower extremity edema and so the facility increased her Lasix from 20 to 80 mg for a few days then went down to 40 mg daily.  She was discharged on 40 mg however about 10 days ago her PCP dropped it down to 20 mg daily.  A few days ago she began having increased shortness of breath and orthopnea requiring increased amount of pillows to sleep with cough that was mostly present at night.  Upon arrival, SPO2 on room air was 68% and was placed on NRB with improvement to 90s and switched to HFNC at 20.  Currently starting to feel better after the Lasix.    ED Course: Afebrile, tachycardic, tachypneic,  hypoxic (SpO2 68%). Notable Labs: VBG: pH 7.3, PCO2 49, PO2 <31, bicarb 27.  BUN 34, creatinine 1.26, BNP 492, troponin 32-> 43. Notable Imaging: CXR with findings suggestive of CHF with basilar opacities on the left not excluding concomitant pneumonia.  CTA chest without PE however with pleural effusions bilaterally and combination of atelectasis and compressive atelectasis in both lung bases and concern for superimposed pneumonia.   Currently ongoing diuresing with IV Lasix 40 mg twice daily.  She received 1 dose of cefepime in the ED.  IV antibiotics are held for now.    02/15/20: Seen and examined at her bedside.  She denies any chest pain however she continues to have a nonproductive cough with mild tinge  of blood in it.  She had a negative CTA PE.  She is not on blood thinners.  She states her breathing is improved this morning on IV Lasix.  Assessment/Plan: Principal Problem:   Acute on chronic diastolic CHF (congestive heart failure) (HCC) Active Problems:   Dyslipidemia   CKD (chronic kidney disease), stage III (HCC)   Hip fracture (HCC)   Benign essential HTN  a. Acute hypoxic respiratory failure, likely multifactorial, secondary to HCAP, acute on chronic diastolic CHF, and atelectasis.  b. On presentation required high flow nasal cannula 20L to maintain O2 saturation greater than 92%.  c. Not on oxygen supplementation at baseline  d. Recently had her Lasix dose decreased which is likely the inciting event e. CTA chest negative for PE and concerning for possible superimposed pneumonia, received 1 dose of IV cefepime in the ED. f. Antibiotics are held for now.   g. Obtain procalcitonin level in the morning h. Continue IV diuretics and oral potassium replacement. i. Wean off oxygen as tolerated j. Start incentive spirometer and flutter valve for atelectasis.  Acute on chronic diastolic CHF Presented with elevated BNP, pulmonary edema and bilateral pleural effusions likely cardiac etiology Last 2D echo on 10/14/2019 showed normal LVEF 65 to 70% with grade 1 diastolic dysfunction Has responded well to diuretics Resume home cardiac medications Monitor strict I's and O's and daily weight Net I&O -2.6 L  Bilateral pleural effusions and atelectasis Likely cardiac as an etiology Mobilize as tolerated Incentive spirometer  and flutter valve  CKD 3b, at baseline Monitor electrolytes and renal function while on IV diuretics Good urine output 2.7 L recorded in the last 24 hours. Daily renal panel  Recent left hip fracture a. No PE on chest imaging b. Hold off twice daily aspirin and switch to subcu Lovenox daily for DVT prophylaxis. c. PT to evaluate d. For  precautions  Hyperlipidemia a. Continue home meds  Hypertension a. Losartan and Coreg have been on hold, will continue to hold for now due to soft BP. b. Continue home amlodipine  c. Continue to closely monitor vital signs.  Episode of minor hemoptysis, likely from coughing spells a. Hb stable b. Continue to monitor  Elevated troponin a. Troponin flat at 32-> 43 in the setting of CKD and heart failure b. Denies any anginal symptoms c. Continue telemetry  Urinary incontinence Resume home regimen.     Code Status: Full code  Family Communication: None at bedside  Disposition Plan: Likely will discharge to home with home health services.   Consultants:  None  Procedures:  None  Antimicrobials:  None  DVT prophylaxis: Subcu Lovenox daily.  Status is: Inpatient    Dispo:  Patient From: Home  Planned Disposition: Home with Health Care Svc  Expected discharge date: 02/17/20  Medically stable for discharge: No, ongoing management of acute on chronic diastolic CHF, acute hypoxic respiratory failure.         Objective: Vitals:   02/14/20 2034 02/15/20 0017 02/15/20 0447 02/15/20 0926  BP:  (!) 121/58 131/62 (!) 128/59  Pulse:  81 83 81  Resp:  18 18 12   Temp:  98.5 F (36.9 C) (!) 97.3 F (36.3 C) 97.6 F (36.4 C)  TempSrc:  Oral Oral Oral  SpO2:  95% 95% 97%  Weight: 74.9 kg  68.5 kg   Height: 5' (1.524 m)       Intake/Output Summary (Last 24 hours) at 02/15/2020 1307 Last data filed at 02/14/2020 2024 Gross per 24 hour  Intake 100 ml  Output 2750 ml  Net -2650 ml   Filed Weights   02/14/20 1340 02/14/20 2034 02/15/20 0447  Weight: 70.8 kg 74.9 kg 68.5 kg    Exam:  . General: 86 y.o. year-old female well developed well nourished in no acute distress.  Alert and oriented x3. . Cardiovascular: Regular rate and rhythm with no rubs or gallops.  No thyromegaly or JVD noted.   [name] Respiratory: Diffuse rales bilaterally.  No wheezing  noted.  Good inspiratory effort.   . Abdomen: Soft nontender nondistended with normal bowel sounds x4 quadrants. . Musculoskeletal: Trace lower extremity edema bilaterally.   . Skin: No ulcerative lesions noted or rashes . Psychiatry: Mood is appropriate for condition and setting   Data Reviewed: CBC: Recent Labs  Lab 02/14/20 0914 02/15/20 0543  WBC 8.4 6.4  NEUTROABS 7.0  --   HGB 10.7* 10.0*  HCT 34.3* 32.3*  MCV 99.7 98.8  PLT 250 109   Basic Metabolic Panel: Recent Labs  Lab 02/14/20 0914 02/15/20 0543  NA 141 139  K 4.1 3.5  CL 104 100  CO2 27 30  GLUCOSE 140* 87  BUN 34* 31*  CREATININE 1.26* 1.08*  CALCIUM 9.1 8.4*   GFR: Estimated Creatinine Clearance: 27.5 mL/min (A) (by C-G formula based on SCr of 1.08 mg/dL (H)). Liver Function Tests: Recent Labs  Lab 02/14/20 0914  AST 20  ALT 13  ALKPHOS 88  BILITOT 0.8  PROT 7.8  ALBUMIN  3.6   No results for input(s): LIPASE, AMYLASE in the last 168 hours. No results for input(s): AMMONIA in the last 168 hours. Coagulation Profile: No results for input(s): INR, PROTIME in the last 168 hours. Cardiac Enzymes: No results for input(s): CKTOTAL, CKMB, CKMBINDEX, TROPONINI in the last 168 hours. BNP (last 3 results) No results for input(s): PROBNP in the last 8760 hours. HbA1C: No results for input(s): HGBA1C in the last 72 hours. CBG: No results for input(s): GLUCAP in the last 168 hours. Lipid Profile: No results for input(s): CHOL, HDL, LDLCALC, TRIG, CHOLHDL, LDLDIRECT in the last 72 hours. Thyroid Function Tests: No results for input(s): TSH, T4TOTAL, FREET4, T3FREE, THYROIDAB in the last 72 hours. Anemia Panel: No results for input(s): VITAMINB12, FOLATE, FERRITIN, TIBC, IRON, RETICCTPCT in the last 72 hours. Urine analysis:    Component Value Date/Time   COLORURINE YELLOW 09/17/2016 1155   APPEARANCEUR Sl Cloud[place] (A) 09/17/2016 1155   LABSPEC 1.025 09/17/2016 1155   PHURINE 5.0 09/17/2016 1155    GLUCOSEU NEGATIVE 09/17/2016 1155   HGBUR NEGATIVE 09/17/2016 1155   BILIRUBINUR NEGATIVE 09/17/2016 1155   BILIRUBINUR n 06/18/2016 1016   KETONESUR 15 (A) 09/17/2016 1155   PROTEINUR n 06/18/2016 1016   PROTEINUR NEGATIVE 06/02/2016 1753   UROBILINOGEN 0.2 09/17/2016 1155   NITRITE NEGATIVE 09/17/2016 1155   LEUKOCYTESUR TRACE (A) 09/17/2016 1155   Sepsis Labs: @LABRCNTIP (procalcitonin:4,lacticidven:4)  ) Recent Results (from the past 240 hour(s))  Respiratory Panel by RT PCR (Flu A&B, Covid) - Nasopharyngeal Swab     Status: None   Collection Time: 02/14/20  9:20 AM   Specimen: Nasopharyngeal Swab  Result Value Ref Range Status   SARS Coronavirus 2 by RT PCR NEGATIVE NEGATIVE Final    Comment: (NOTE) SARS-CoV-2 target nucleic acids are NOT DETECTED.  The SARS-CoV-2 RNA is generally detectable in upper respiratoy specimens during the acute phase of infection. The lowest concentration of SARS-CoV-2 viral copies this assay can detect is 131 copies/mL. A negative result does not preclude SARS-Cov-2 infection and should not be used as the sole basis for treatment or other patient management decisions. A negative result may occur with  improper specimen collection/handling, submission of specimen other than nasopharyngeal swab, presence of viral mutation(s) within the areas targeted by this assay, and inadequate number of viral copies (<131 copies/mL). A negative result must be combined with clinical observations, patient history, and epidemiological information. The expected result is Negative.  Fact Sheet for Patients:  PinkCheek.be  Fact Sheet for Healthcare Providers:  GravelBags.it  This test is no t yet approved or cleared by the [place] FDA and  has been authorized for detection and/or diagnosis of SARS-CoV-2 by FDA under an Emergency Use Authorization (EUA). This EUA will remain  in effect (meaning this  test can be used) for the duration of the COVID-19 declaration under Section 564(b)(1) of the Act, 21 U.S.C. section 360bbb-3(b)(1), unless the authorization is terminated or revoked sooner.     Influenza A by PCR NEGATIVE NEGATIVE Final   Influenza B by PCR NEGATIVE NEGATIVE Final    Comment: (NOTE) The Xpert Xpress SARS-CoV-2/FLU/RSV assay is intended as an aid in  the diagnosis of influenza from Nasopharyngeal swab specimens and  should not be used as a sole basis for treatment. Nasal washings and  aspirates are unacceptable for Xpert Xpress SARS-CoV-2/FLU/RSV  testing.  Fact Sheet for Patients: PinkCheek.be  Fact Sheet for Healthcare Providers: GravelBags.it  This test is not yet approved or cleared  by the [place] and  has been authorized for detection and/or diagnosis of SARS-CoV-2 by  FDA under an Emergency Use Authorization (EUA). This EUA will remain  in effect (meaning this test can be used) for the duration of the  Covid-19 declaration under Section 564(b)(1) of the Act, 21  U.S.C. section 360bbb-3(b)(1), unless the authorization is  terminated or revoked. Performed at [facility], [place] [address], [place], [place] [address]       Studies: CT Angio Chest PE W/Cm &/Or Wo Cm  Result Date: 02/14/2020 CLINICAL DATA:  Shortness of breath and cough. EXAM: CT ANGIOGRAPHY CHEST WITH CONTRAST TECHNIQUE: Multidetector CT imaging of the chest was performed using the standard protocol during bolus administration of intravenous contrast. Multiplanar CT image reconstructions and MIPs were obtained to evaluate the vascular anatomy. CONTRAST:  78mL OMNIPAQUE IOHEXOL 350 MG/ML SOLN COMPARISON:  Chest radiograph February 14, 2020 FINDINGS: Cardiovascular: There is no demonstrable pulmonary embolus. There is no appreciable thoracic aortic aneurysm. No dissection evident. Note that the contrast bolus in  the aorta is not sufficient for confident dissection assessment. There are scattered foci of calcification in visualized great vessels. There are foci of aortic atherosclerosis. Patient is status post aortic valve replacement. There are multiple foci of coronary artery calcification. There is extensive calcification of the mitral annulus. There is no pericardial effusion or pericardial thickening. Mediastinum/Nodes: The visualized thyroid appears normal. No appreciable thoracic adenopathy. No esophageal lesions are appreciable. Lungs/Pleura: There are moderate free-flowing pleural effusions with compressive atelectasis and patchy consolidation in the lung bases. Elsewhere, there are patchy areas of airspace opacity in the mid lung regions as well areas of somewhat mosaic attenuation bilaterally. No appreciable interstitial edema evident. Upper Abdomen: Visualized upper abdominal structures appear unremarkable except for aortic atherosclerosis. Musculoskeletal: There are foci of degenerative change in the thoracic spine. No blastic or lytic bone lesions. No evident chest wall lesions. Review of the MIP images confirms the above findings. IMPRESSION: 1. No demonstrable pulmonary embolus. No thoracic aortic aneurysm. No dissection evident; note that the contrast bolus in the aorta is not sufficient for confident dissection assessment. There is aortic atherosclerosis as well as great vessel and coronary artery calcification. There is mitral annulus calcification. Patient is status post aortic valve replacement. 2. Pleural effusions bilaterally with combination of atelectasis and compressive atelectasis in both lung bases. Scattered areas of ill-defined airspace opacity bilaterally as well as areas of mosaic attenuation. It is difficult to ascertain whether these areas represent foci of pneumonia versus a degree of pulmonary edema. Both entities may be present concurrently. There is concern for potential degree of  congestive heart failure with superimposed pneumonia. The mosaic attenuation the lungs may indicate a degree of superimposed small airways obstructive disease. 3.  No adenopathy. Aortic Atherosclerosis (ICD10-I70.0). Electronically Signed   By: Lowella Grip III M.D.   On: 02/14/2020 14:38    Scheduled Meds: . amLODipine  7.5 mg Oral Daily  . aspirin  81 mg Oral Daily  . enoxaparin (LOVENOX) injection  30 mg Subcutaneous Q24H  . ezetimibe  10 mg Oral Daily  . furosemide  40 mg Intravenous BID  . latanoprost  1 drop Both Eyes QHS  . sodium chloride flush  3 mL Intravenous Q12H    Continuous Infusions: . ceFEPime (MAXIPIME) IV Stopped (02/14/20 1714)     LOS: 1 day     Kayleen Memos, MD [facility] Pager [phone]  If 7PM-7AM, please contact night-coverage [email or web address]  Password [email or web address] 02/15/2020, 1:07 PM

## 2020-02-15 NOTE — Evaluation (Signed)
Physical Therapy Evaluation Patient Details Name: Sara Garza MRN: [id number] DOB: [birth year] Today's Date: 02/15/2020   History of Present Illness  86 yo female admitted with CHF. Hx of CAD, HF, DM, CKD, aortic stenosis, [name], LBBB, L hip IM nail 12/2019, R hip hemi 2015  Clinical Impression  On eval, pt required Min assist for mobility. She was able to stand and pivot to recliner using a RW. O2 90% on RA at rest and 87% on RA while mobilizing. Discussed d/c plan-pt plans to return home. Her daughter has been staying with her for the last few weeks (however this is temporary). She is agreeable to resuming HHPT once medically cleared to d/c home. Will plan to follow and progress activity as tolerated.     Follow Up Recommendations Home health PT    Equipment Recommendations  None recommended by PT    Recommendations for Other Services       Precautions / Restrictions Precautions Precautions: Fall Precaution Comments: monitor O2 Restrictions Weight Bearing Restrictions: No      Mobility  Bed Mobility Overal bed mobility: Needs Assistance Bed Mobility: Supine to Sit     Supine to sit: Min guard;HOB elevated     General bed mobility comments: Increased time.    Transfers Overall transfer level: Needs assistance Equipment used: Rolling walker (2 wheeled) Transfers: Sit to/from [facility] Sit to Stand: Min assist Stand pivot transfers: Min assist       General transfer comment: Assist to rise, stabilize. Stand pivot, bed to recliner, using RW. O2 87% on RA.  Ambulation/Gait                Stairs            Wheelchair Mobility    Modified Rankin (Stroke Patients Only)       Balance Overall balance assessment: Needs assistance         Standing balance support: Bilateral upper extremity supported Standing balance-Leahy Scale: Poor                               Pertinent Vitals/Pain Pain Assessment:  Faces Faces Pain Scale: Hurts little more Pain Location: L hip with activity Pain Descriptors / Indicators: Discomfort;Sore;Aching Pain Intervention(s): Limited activity within patient's tolerance;Monitored during session;Repositioned    Home Living Family/patient expects to be discharged to:: Private residence Living Arrangements: Alone Available Help at Discharge: Family (daughter has been staying with pt for last 3 weeks (this is temporary)) Type of Home: Apartment Home Access: Stairs to enter   [employer] of Steps: 1 Home Layout: One level Home Equipment: Walker - 2 wheels;Walker - 4 wheels;Cane - single point      Prior Function Level of Independence: Needs assistance   Gait / Transfers Assistance Needed: uses RW  ADL's / Homemaking Assistance Needed: [employer] 2x/week. Also receiving PT/OT        Hand Dominance        Extremity/Trunk Assessment   Upper Extremity Assessment Upper Extremity Assessment: Generalized weakness    Lower Extremity Assessment Lower Extremity Assessment: Generalized weakness    Cervical / Trunk Assessment Cervical / Trunk Assessment: Kyphotic  Communication   Communication: HOH  Cognition Arousal/Alertness: Awake/alert Behavior During Therapy: WFL for tasks assessed/performed Overall Cognitive Status: Within Functional Limits for tasks assessed  General Comments      Exercises     Assessment/Plan    PT Assessment Patient needs continued PT services  PT Problem List Decreased strength;Decreased mobility;Decreased balance;Decreased activity tolerance;Decreased knowledge of use of DME       PT Treatment Interventions DME instruction;Gait training;Therapeutic activities;Therapeutic exercise;Patient/family education;Balance training;Functional mobility training    PT Goals (Current goals can be found in the Care Plan section)  Acute Rehab PT Goals Patient Stated  Goal: no oxygen. home. PT Goal Formulation: With patient Time For Goal Achievement: 02/29/20 Potential to Achieve Goals: Good    Frequency Min 3X/week   Barriers to discharge        Co-evaluation               AM-PAC PT "6 Clicks" Mobility  Outcome Measure Help needed turning from your back to your side while in a flat bed without using bedrails?: A Little Help needed moving from lying on your back to sitting on the side of a flat bed without using bedrails?: A Little Help needed moving to and from a bed to a chair (including a wheelchair)?: A Little Help needed standing up from a chair using your arms (e.g., wheelchair or bedside chair)?: A Little Help needed to walk in hospital room?: A Little Help needed climbing 3-5 steps with a railing? : A Little 6 Click Score: 18    End of Session Equipment Utilized During Treatment: Gait belt Activity Tolerance: Patient tolerated treatment well Patient left: in chair;with call bell/phone within reach;with chair alarm set   PT Visit Diagnosis: Muscle weakness (generalized) (M62.81);History of falling (Z91.81)    Time: 1497-0263 PT Time Calculation (min) (ACUTE ONLY): 20 min   Charges:   PT Evaluation $PT Eval Low Complexity: [place], PT Acute Rehabilitation  Office: [phone] Pager: [phone]

## 2020-02-16 DIAGNOSIS — J9601 Acute respiratory failure with hypoxia: Secondary | ICD-10-CM

## 2020-02-16 DIAGNOSIS — N1832 Chronic kidney disease, stage 3b: Secondary | ICD-10-CM

## 2020-02-16 DIAGNOSIS — I5033 Acute on chronic diastolic (congestive) heart failure: Secondary | ICD-10-CM

## 2020-02-16 DIAGNOSIS — I10 Essential (primary) hypertension: Secondary | ICD-10-CM

## 2020-02-16 LAB — CBC
HCT: 32.6 % — ABNORMAL LOW (ref 36.0–46.0)
Hemoglobin: 10.3 g/dL — ABNORMAL LOW (ref 12.0–15.0)
MCH: 31.1 pg (ref 26.0–34.0)
MCHC: 31.6 g/dL (ref 30.0–36.0)
MCV: 98.5 fL (ref 80.0–100.0)
Platelets: 239 10*3/uL (ref 150–400)
RBC: 3.31 MIL/uL — ABNORMAL LOW (ref 3.87–5.11)
RDW: 14 % (ref 11.5–15.5)
WBC: 5.9 10*3/uL (ref 4.0–10.5)
nRBC: 0 % (ref 0.0–0.2)

## 2020-02-16 LAB — BASIC METABOLIC PANEL WITH GFR
Anion gap: 8 (ref 5–15)
BUN: 40 mg/dL — ABNORMAL HIGH (ref 8–23)
CO2: 29 mmol/L (ref 22–32)
Calcium: 8.3 mg/dL — ABNORMAL LOW (ref 8.9–10.3)
Chloride: 100 mmol/L (ref 98–111)
Creatinine, Ser: 1.68 mg/dL — ABNORMAL HIGH (ref 0.44–1.00)
GFR, Estimated: 28 mL/min — ABNORMAL LOW
Glucose, Bld: 99 mg/dL (ref 70–99)
Potassium: 5.2 mmol/L — ABNORMAL HIGH (ref 3.5–5.1)
Sodium: 137 mmol/L (ref 135–145)

## 2020-02-16 LAB — PROCALCITONIN: Procalcitonin: 0.12 ng/mL

## 2020-02-16 LAB — MAGNESIUM: Magnesium: 2.3 mg/dL (ref 1.7–2.4)

## 2020-02-16 MED ORDER — LIP MEDEX EX OINT
TOPICAL_OINTMENT | CUTANEOUS | Status: AC
  Filled 2020-02-16: qty 7

## 2020-02-16 NOTE — Progress Notes (Signed)
Sara Garza  LHT:[id number] DOB: 1923/08/08 DOA: 02/14/2020 PCP: [name], NP    Brief Narrative:  86 year old female with past medical history of CAD, HFpEF, hypertension, hyperlipidemia, type 2 diabetes, ASs/p [name], LBBB, CKD, recent left hip fracture s/p repair in September who presented to the ED with shortness of breath x3 days. Has associated productive coughwith a single episode of minor hemoptysisand bilateral lower extremity pitting edema. After her recent surgery she spent 2 weeks at a rehab facility where she started having increased lower extremity edema and so the facility increased her Lasix from 20 to 80 mg for a few days then went down to 40 mg daily. She was discharged on 40 mg however about 10 days ago her PCP dropped it down to 20 mg daily. A few days ago she began having increased shortness of breath and orthopnearequiring increased amount of pillows to sleepwith cough that was mostly present at night.Upon arrival, SPO2 on room air was 68%  CXR with findings suggestive of CHF with basilar opacities on the left not excluding concomitant pneumonia. CTA chest without PE however with pleural effusions bilaterally and combination of atelectasis and compressive atelectasis in both lung bases and initial concern for superimposed pneumonia  Assessment & Plan:   Principal Problem:   Acute on chronic diastolic CHF (congestive heart failure) (HCC) Active Problems:   Dyslipidemia   CKD (chronic kidney disease), stage III (HCC)   Hip fracture (HCC)   Benign essential HTN   Acute hypoxic respiratory failure, likely multifactorial, secondary to HCAP, acute on chronic diastolic CHF, and atelectasis.  a. On presentation required high flow nasal cannula 20L to maintain O2 saturation greater than 92%.  b. Not on oxygen supplementation at baseline  c. Reportedly had her Lasix dose decreased which is likely the inciting event d. CTA chest negative for  PE and concerning for possible superimposed pneumonia, received 1 dose of IV cefepime in the ED. e. Antibiotics are held for now.   f. Will now order procalcitonin in the AM g. Continue IV lasix 40mg  bid h. Wean off oxygen as tolerated i. Start incentive spirometer and flutter valve for atelectasis.  Acute on chronic diastolic CHF Presented with elevated BNP, pulmonary edema and bilateral pleural effusions likely cardiac etiology Last 2D echo on 10/14/2019 showed normal LVEF 65 to 70% with grade 1 diastolic dysfunction Responding well to diuretic Resume home cardiac medications Cont to monitor strict I's and O's and daily weight  Bilateral pleural effusions and atelectasis Likely cardiac as an etiology Cont to encourage mobilization as tolerated Incentive spirometer and flutter valve  CKD 3b, at baseline Cr up to 1.68 which is near patient's prior baseline several months ago, reviewed Will repeat bmet in AM  Recent left hip fracture a. No PE on chest imaging b. PT with recs for home health PT  Hyperlipidemia a. Continue home meds as tolerated  Hypertension a. Losartan and Coreg have been on hold, continuing to hold for now due to soft BP. b. Continue home amlodipine as tolerated  c. Continue to closely monitor vital signs.  Episode of minor hemoptysis, likely from coughing spells a. Hb stable b. Continue to monitor  Elevated troponin a. Troponin flat at32->43 in the setting of CKD and heart failure b. Denies any anginal symptoms c. Continue to monitor on tele  Urinary incontinence Resume home regimen.  DVT prophylaxis: Lovenox subq Code Status: Full Family Communication: Pt in room, family not at bedside  Status  is: Inpatient  Remains inpatient appropriate because:Unsafe d/c plan   Dispo:  Patient From: Home  Planned Disposition: Home with Health Care Svc  Expected discharge date: 02/18/20  Medically stable for discharge: No          Consultants:     Procedures:     Antimicrobials: Anti-infectives (From admission, onward)   Start     Dose/Rate Route Frequency Ordered Stop   02/14/20 1530  vancomycin (VANCOREADY) IVPB 1250 mg/250 mL  Status:  Discontinued        1,250 mg 166.7 mL/hr over 90 Minutes Intravenous  Once 02/14/20 1517 02/14/20 1716   02/14/20 1500  ceFEPIme (MAXIPIME) 2 g in sodium chloride 0.9 % 100 mL IVPB  Status:  Discontinued        2 g 200 mL/hr over 30 Minutes Intravenous  Once 02/14/20 1451 02/15/20 1713       Subjective: Reports feeling better, still requiring O2  Objective: Vitals:   02/15/20 1535 02/15/20 2153 02/16/20 0628 02/16/20 1205  BP: (!) 122/45 125/71 (!) 125/54 (!) 110/57  Pulse: 75 78 78 86  Resp: 12 18 16 16   Temp: 98.4 F (36.9 C) 97.9 F (36.6 C) 97.7 F (36.5 C) 97.6 F (36.4 C)  TempSrc: Oral Oral Oral Oral  SpO2: 94% 99% 97% 95%  Weight:      Height:        Intake/Output Summary (Last 24 hours) at 02/16/2020 1604 Last data filed at 02/16/2020 1237 Gross per 24 hour  Intake 600 ml  Output 1400 ml  Net -800 ml   Filed Weights   02/14/20 1340 02/14/20 2034 02/15/20 0447  Weight: 70.8 kg 74.9 kg 68.5 kg    Examination:  General exam: Appears calm and comfortable  Respiratory system: Clear to auscultation. Respiratory effort normal. Cardiovascular system: S1 & S2 heard, Regular Gastrointestinal system: Abdomen is nondistended, soft and nontender. No organomegaly or masses felt. Normal bowel sounds heard. Central nervous system: Alert and oriented. No focal neurological deficits. Extremities: Symmetric 5 x 5 power. Skin: No rashes, lesions  Psychiatry: Judgement and insight appear normal. Mood & affect appropriate.   Data Reviewed: I have personally reviewed following labs and imaging studies  CBC: Recent Labs  Lab 02/14/20 0914 02/15/20 0543 02/16/20 0538  WBC 8.4 6.4 5.9  NEUTROABS 7.0  --   --   HGB 10.7* 10.0* 10.3*  HCT 34.3* 32.3*  32.6*  MCV 99.7 98.8 98.5  PLT 250 229 106   Basic Metabolic Panel: Recent Labs  Lab 02/14/20 0914 02/15/20 0543 02/16/20 0538  NA 141 139 137  K 4.1 3.5 5.2*  CL 104 100 100  CO2 27 30 29   GLUCOSE 140* 87 99  BUN 34* 31* 40*  CREATININE 1.26* 1.08* 1.68*  CALCIUM 9.1 8.4* 8.3*  MG  --   --  2.3   GFR: Estimated Creatinine Clearance: 17.7 mL/min (A) (by C-G formula based on SCr of 1.68 mg/dL (H)). Liver Function Tests: Recent Labs  Lab 02/14/20 0914  AST 20  ALT 13  ALKPHOS 88  BILITOT 0.8  PROT 7.8  ALBUMIN 3.6   No results for input(s): LIPASE, AMYLASE in the last 168 hours. No results for input(s): AMMONIA in the last 168 hours. Coagulation Profile: No results for input(s): INR, PROTIME in the last 168 hours. Cardiac Enzymes: No results for input(s): CKTOTAL, CKMB, CKMBINDEX, TROPONINI in the last 168 hours. BNP (last 3 results) No results for input(s): PROBNP in the last 8760  hours. HbA1C: No results for input(s): HGBA1C in the last 72 hours. CBG: No results for input(s): GLUCAP in the last 168 hours. Lipid Profile: No results for input(s): CHOL, HDL, LDLCALC, TRIG, CHOLHDL, LDLDIRECT in the last 72 hours. Thyroid Function Tests: No results for input(s): TSH, T4TOTAL, FREET4, T3FREE, THYROIDAB in the last 72 hours. Anemia Panel: No results for input(s): VITAMINB12, FOLATE, FERRITIN, TIBC, IRON, RETICCTPCT in the last 72 hours. Sepsis Labs: No results for input(s): PROCALCITON, LATICACIDVEN in the last 168 hours.  Recent Results (from the past 240 hour(s))  Respiratory Panel by RT PCR (Flu A&B, Covid) - Nasopharyngeal Swab     Status: None   Collection Time: 02/14/20  9:20 AM   Specimen: Nasopharyngeal Swab  Result Value Ref Range Status   SARS Coronavirus 2 by RT PCR NEGATIVE NEGATIVE Final    Comment: (NOTE) SARS-CoV-2 target nucleic acids are NOT DETECTED.  The SARS-CoV-2 RNA is generally detectable in upper respiratoy specimens during the acute  phase of infection. The lowest concentration of SARS-CoV-2 viral copies this assay can detect is 131 copies/mL. A negative result does not preclude SARS-Cov-2 infection and should not be used as the sole basis for treatment or other patient management decisions. A negative result may occur with  improper specimen collection/handling, submission of specimen other than nasopharyngeal swab, presence of viral mutation(s) within the areas targeted by this assay, and inadequate number of viral copies (<131 copies/mL). A negative result must be combined with clinical observations, patient history, and epidemiological information. The expected result is Negative.  Fact Sheet for Patients:  PinkCheek.be  Fact Sheet for Healthcare Providers:  GravelBags.it  This test is no t yet approved or cleared by the [place] FDA and  has been authorized for detection and/or diagnosis of SARS-CoV-2 by FDA under an Emergency Use Authorization (EUA). This EUA will remain  in effect (meaning this test can be used) for the duration of the COVID-19 declaration under Section 564(b)(1) of the Act, 21 U.S.C. section 360bbb-3(b)(1), unless the authorization is terminated or revoked sooner.     Influenza A by PCR NEGATIVE NEGATIVE Final   Influenza B by PCR NEGATIVE NEGATIVE Final    Comment: (NOTE) The Xpert Xpress SARS-CoV-2/FLU/RSV assay is intended as an aid in  the diagnosis of influenza from Nasopharyngeal swab specimens and  should not be used as a sole basis for treatment. Nasal washings and  aspirates are unacceptable for Xpert Xpress SARS-CoV-2/FLU/RSV  testing.  Fact Sheet for Patients: PinkCheek.be  Fact Sheet for Healthcare Providers: GravelBags.it  This test is not yet approved or cleared by the [place] FDA and  has been authorized for detection and/or diagnosis of  SARS-CoV-2 by  FDA under an Emergency Use Authorization (EUA). This EUA will remain  in effect (meaning this test can be used) for the duration of the  Covid-19 declaration under Section 564(b)(1) of the Act, 21  U.S.C. section 360bbb-3(b)(1), unless the authorization is  terminated or revoked. Performed at [facility], [place] [address], [place], [place] [address]   Culture, blood (routine x 2)     Status: None (Preliminary result)   Collection Time: 02/14/20  4:45 PM   Specimen: BLOOD  Result Value Ref Range Status   Specimen Description   Final    BLOOD LEFT ANTECUBITAL Performed at [facility] [address], [place], [place] [address]    Special Requests   Final    BOTTLES DRAWN AEROBIC AND ANAEROBIC Blood  Culture adequate volume Performed at [facility] [address] [place], [place] [address]    Culture   Final    NO GROWTH 2 DAYS Performed at [facility] [address] [place], [place] [address]    Report Status PENDING  Incomplete  Culture, blood (routine x 2)     Status: None (Preliminary result)   Collection Time: 02/14/20  4:45 PM   Specimen: BLOOD  Result Value Ref Range Status   Specimen Description   Final    BLOOD RIGHT ANTECUBITAL Performed at [facility] [address], [place], [place] [address]    Special Requests   Final    BOTTLES DRAWN AEROBIC AND ANAEROBIC Blood Culture adequate volume Performed at [facility] [address] [place], [place] [address]    Culture   Final    NO GROWTH 2 DAYS Performed at [facility] [address], [place], [place] [address]    Report Status PENDING  Incomplete     Radiology Studies: No results found.  Scheduled Meds: . amLODipine  7.5 mg Oral Daily  . aspirin  81 mg Oral Daily  . enoxaparin (LOVENOX) injection  30 mg Subcutaneous Q24H  . ezetimibe  10 mg Oral Daily  . furosemide  40 mg  Intravenous BID  . latanoprost  1 drop Both Eyes QHS  . sodium chloride flush  3 mL Intravenous Q12H   Continuous Infusions:   LOS: 2 days   Marylu Lund, MD [facility] Pager On Amion  If 7PM-7AM, please contact night-coverage 02/16/2020, 4:04 PM

## 2020-02-16 NOTE — Progress Notes (Signed)
Physical Therapy Treatment Patient Details Name: Sara Garza MRN: [id number] DOB: December 04, 1923 Today's Date: 02/16/2020    History of Present Illness 86 yo female admitted with CHF. Hx of CAD, HF, DM, CKD, aortic stenosis, [name], LBBB, L hip IM nail 12/2019, R hip hemi 2015    PT Comments    Progressing with mobility. O2 92% on RA but dyspnea 3/4 with activity. Will continue to follow and progress activity as tolerated.    Follow Up Recommendations  Home health PT;Supervision for mobility/OOB     Equipment Recommendations  None recommended by PT    Recommendations for Other Services       Precautions / Restrictions Precautions Precautions: Fall Precaution Comments: monitor O2 Restrictions Weight Bearing Restrictions: No    Mobility  Bed Mobility Overal bed mobility: Needs Assistance Bed Mobility: Supine to Sit     Supine to sit: Min guard;HOB elevated     General bed mobility comments: Increased time.  Transfers Overall transfer level: Needs assistance Equipment used: Rolling walker (2 wheeled) Transfers: Sit to/from Stand Sit to Stand: Min assist         General transfer comment: Assist to rise, stabilize. Cues for safety, hand placement. Increased time.  Ambulation/Gait Ambulation/Gait assistance: Min assist Gait Distance (Feet): 45 Feet Assistive device: Rolling walker (2 wheeled) Gait Pattern/deviations: Step-through pattern;Decreased stride length     General Gait Details: Assist to stabilize pt throughout distance. Pt is unsteady. O2 92% on RA but dyspena 3/4 with ambulation.   Stairs             Wheelchair Mobility    Modified Rankin (Stroke Patients Only)       Balance Overall balance assessment: Needs assistance;History of Falls         Standing balance support: Bilateral upper extremity supported Standing balance-Leahy Scale: Poor                              Cognition Arousal/Alertness: Awake/alert Behavior  During Therapy: WFL for tasks assessed/performed Overall Cognitive Status: Within Functional Limits for tasks assessed                                        Exercises      General Comments        Pertinent Vitals/Pain Pain Assessment: Faces Faces Pain Scale: Hurts little more Pain Location: L hip with activity Pain Descriptors / Indicators: Discomfort;Sore;Aching Pain Intervention(s): Limited activity within patient's tolerance;Monitored during session    Home Living                      Prior Function            PT Goals (current goals can now be found in the care plan section) Progress towards PT goals: Progressing toward goals    Frequency    Min 3X/week      PT Plan Current plan remains appropriate    Co-evaluation              AM-PAC PT "6 Clicks" Mobility   Outcome Measure  Help needed turning from your back to your side while in a flat bed without using bedrails?: A Little Help needed moving from lying on your back to sitting on the side of a flat bed without using bedrails?: A Little Help needed moving to  and from a bed to a chair (including a wheelchair)?: A Little Help needed standing up from a chair using your arms (e.g., wheelchair or bedside chair)?: A Little Help needed to walk in hospital room?: A Little Help needed climbing 3-5 steps with a railing? : A Lot 6 Click Score: 17    End of Session Equipment Utilized During Treatment: Gait belt Activity Tolerance: Patient limited by fatigue Patient left: in chair;with call bell/phone within reach;with chair alarm set   PT Visit Diagnosis: Muscle weakness (generalized) (M62.81);History of falling (Z91.81)     Time: 0950-1009 PT Time Calculation (min) (ACUTE ONLY): 19 min  Charges:  $Gait Training: 8-22 mins                         Doreatha Massed, PT Acute Rehabilitation  Office: [phone] Pager: [phone]

## 2020-02-17 LAB — CBC
HCT: 31.1 % — ABNORMAL LOW (ref 36.0–46.0)
Hemoglobin: 10.3 g/dL — ABNORMAL LOW (ref 12.0–15.0)
MCH: 31.2 pg (ref 26.0–34.0)
MCHC: 33.1 g/dL (ref 30.0–36.0)
MCV: 94.2 fL (ref 80.0–100.0)
Platelets: UNDETERMINED 10*3/uL (ref 150–400)
RBC: 3.3 MIL/uL — ABNORMAL LOW (ref 3.87–5.11)
RDW: 13.7 % (ref 11.5–15.5)
WBC: 4.6 10*3/uL (ref 4.0–10.5)
nRBC: 0 % (ref 0.0–0.2)

## 2020-02-17 LAB — BASIC METABOLIC PANEL
Anion gap: 14 (ref 5–15)
BUN: 47 mg/dL — ABNORMAL HIGH (ref 8–23)
CO2: 25 mmol/L (ref 22–32)
Calcium: 8.6 mg/dL — ABNORMAL LOW (ref 8.9–10.3)
Chloride: 99 mmol/L (ref 98–111)
Creatinine, Ser: 1.79 mg/dL — ABNORMAL HIGH (ref 0.44–1.00)
GFR, Estimated: 26 mL/min — ABNORMAL LOW (ref >60)
Glucose, Bld: 100 mg/dL — ABNORMAL HIGH (ref 70–99)
Potassium: 4.6 mmol/L (ref 3.5–5.1)
Sodium: 138 mmol/L (ref 135–145)

## 2020-02-17 MED ORDER — FUROSEMIDE 40 MG PO TABS: 40.0000 mg | ORAL_TABLET | Freq: Every day | ORAL | 0 refills | Status: DC

## 2020-02-17 NOTE — TOC Transition Note (Signed)
Transition of Care [facility]) - CM/SW Discharge Note   Patient Details  Name: Sara Garza MRN: [id number] Date of Birth: 22-Sep-1923  Transition of Care [facility]) CM/SW Contact:  Dessa Phi, RN Phone Number: 02/17/2020, 3:01 PM   Clinical Narrative:  D/c home w/HHC-Active w/[facility] HHPT/OT/aide. No further CM needs.     Final next level of care: [place] Barriers to Discharge: No Barriers Identified   Patient Goals and CMS Choice        Discharge Placement                       Discharge Plan and Services   Discharge Planning Services: CM Consult                        [facility] Agency: [place] Date [facility] Agency Contacted: 02/17/20 Time HH Agency Contacted: 1501 Representative spoke with at [facility]: [name]  Social Determinants of Health ([place]) Interventions     Readmission Risk Interventions Readmission Risk Prevention Plan 02/17/2020  Post Dischage Appt Complete  Medication Screening Complete  Transportation Screening Complete  Some recent data might be hidden

## 2020-02-17 NOTE — Care Management Important Message (Signed)
Important Message  Patient Details IM Letter given to the Patient. Name: Sara Garza MRN: [id number] Date of Birth: 1924/02/17   Medicare Important Message Given:  Yes     Kerin Salen 02/17/2020, 11:23 AM

## 2020-02-17 NOTE — Discharge Instructions (Signed)
Recommend starting oral Lasix on Saturday (11/20)

## 2020-02-17 NOTE — Plan of Care (Signed)

## 2020-02-17 NOTE — Discharge Summary (Signed)
Physician Discharge Summary  Sara Garza [facility] ZWC:[id number] DOB: 05-04-1923 DOA: 02/14/2020  PCP: [name], NP  Admit date: 02/14/2020 Discharge date: 02/17/2020  Admitted From: Home Disposition:  Home  Recommendations for Outpatient Follow-up:  1. Follow up with PCP in 2-3 weeks 2. Follow up with Cardiology as scheduled 3. Recommend repeat BMET in 1 week with focus on renal function  Discharge Condition:Improved CODE STATUS:Full Diet recommendation: Heart healthy   Brief/Interim Summary: 86 year old female with past medical history of CAD, HFpEF, hypertension, hyperlipidemia, type 2 diabetes, ASs/p [name], LBBB, CKD, recent left hip fracture s/p repair in September who presented to the ED with shortness of breath x3 days. Has associated productive coughwith a single episode of minor hemoptysisand bilateral lower extremity pitting edema. After her recent surgery she spent 2 weeks at a rehab facility where she started having increased lower extremity edema and so the facility increased her Lasix from 20 to 80 mg for a few days then went down to 40 mg daily. She was discharged on 40 mg however about 10 days ago her PCP dropped it down to 20 mg daily. A few days ago she began having increased shortness of breath and orthopnearequiring increased amount of pillows to sleepwith cough that was mostly present at night.Upon arrival, SPO2 on room air was 68%  CXR with findings suggestive of CHF with basilar opacities on the left not excluding concomitant pneumonia. CTA chest without PE however with pleural effusions bilaterally and combination of atelectasis and compressive atelectasis in both lung bases and initial concern for superimposed pneumonia  Discharge Diagnoses:  Principal Problem:   Acute on chronic diastolic CHF (congestive heart failure) (HCC) Active Problems:   Dyslipidemia   CKD (chronic kidney disease), stage III (HCC)   Hip fracture (HCC)   Benign essential  HTN  Acute hypoxic respiratory failure, likely multifactorial, secondary to HCAP, acute on chronic diastolic CHF, and atelectasis.  a. On presentation required high flow nasal cannula20Lto maintain O2 saturation greater than 92%.  b. Not on oxygen supplementation at baseline c. Reportedly had her Lasix dose decreased which is likely the inciting event d. CTA chest negative for PE and concerning for possible superimposed pneumonia,received 1 dose of IV cefepime in the ED. e. Procalcitonin found to be neg f. Patient improved after treatment with scheduled IV lasix g. O2 weaned to room air h. Recommend close follow up with primary Cardiologist  Acute on chronic diastolic CHF Presented with elevated BNP, pulmonary edema and bilateral pleural effusions likely cardiacetiology Last 2D echo on 10/14/2019 showed normal LVEF 65 to 70% with grade 1 diastolic dysfunction Responded well to diuretic Nitrate, ARB and coreg were held at time of discharge secondary to already good control of BP and risk of symptomatic hypotension Will discharge on increased dose of lasix at 40mg  daily  Bilateral pleural effusions and atelectasis Likely cardiac as an etiology Cont to encourage mobilization as tolerated Incentive spirometer and flutter valve  CKD 3b, at baseline Cr up to 1.79 which is near patient's prior baseline several months ago, reviewed Recommend holding lasix x 1-2 days Recommend repeat repeat BMET in 1 week  Recent left hip fracture a. No PE on chest imaging b. PT with recs for home health PT  Hyperlipidemia a. Continue home meds as tolerated  Hypertension a. Losartan, Imdur, and Coreg have been on hold, continuing to hold for now due to soft BP. b. Continuehomeamlodipine as tolerated  c. Continue to closely monitor vital signs.  Episode of minor hemoptysis,  likely from coughing spells a. Hb stable  Elevated troponin a. Troponin flat at32->43 in the setting of CKD  and heart failure b. Denies any anginal symptoms  Urinary incontinence Resume home regimen.  Discharge Instructions   Allergies as of 02/17/2020      Reactions   Statins Other (See Comments)   Muscle aches   Gabapentin Other (See Comments)   SOMNOLENCE      Medication List    STOP taking these medications   carvedilol 3.125 MG tablet Commonly known as: COREG   carvedilol 6.25 MG tablet Commonly known as: COREG   isosorbide mononitrate 60 MG 24 hr tablet Commonly known as: IMDUR   losartan 25 MG tablet Commonly known as: COZAAR   oxybutynin 5 MG 24 hr tablet Commonly known as: DITROPAN-XL     TAKE these medications   acetaminophen 500 MG tablet Commonly known as: TYLENOL Take 2 tablets (1,000 mg total) by mouth 4 (four) times daily.   amLODipine 5 MG tablet Commonly known as: NORVASC TAKE 1 AND 1/2 TABLETS(7.5 MG) BY MOUTH DAILY What changed: See the new instructions.   aspirin 81 MG chewable tablet Commonly known as: Aspirin Childrens Chew 1 tablet (81 mg total) by mouth 2 (two) times daily with a meal.   Calcium 500 +D 500-400 MG-UNIT Tabs Generic drug: Calcium Carb-Cholecalciferol Take 1 tablet by mouth daily.   ezetimibe 10 MG tablet Commonly known as: ZETIA TAKE 1 TABLET(10 MG) BY MOUTH DAILY What changed: See the new instructions.   furosemide 40 MG tablet Commonly known as: Lasix Take 1 tablet (40 mg total) by mouth daily. Start taking on: February 19, 2020 What changed:   medication strength  how much to take  These instructions start on February 19, 2020. If you are unsure what to do until then, ask your doctor or other care provider.   loperamide 2 MG capsule Commonly known as: IMODIUM Take 2 mg by mouth as needed for diarrhea or loose stools.   Lumigan 0.01 % Soln Generic drug: bimatoprost Place 1 drop into both eyes at bedtime.   multivitamin tablet Take 1 tablet by mouth daily.   PRESERVISION AREDS 2+MULTI VIT PO Take 2  capsules by mouth in the morning and at bedtime.       Follow-up Information    [facility], [facility] Follow up.   Specialty: [place] Why: [place] [facility]/occupational therapy/aide Contact information: [place] [address] [phone]        [name], NP. Schedule an appointment as soon as possible for a visit in 2 week(s).   Specialty: Family Medicine Contact information: [place] [address] [phone]        Sherren Mocha, MD Follow up.   Specialty: Cardiology Why: as scheduled Contact information: [address] [place] [address] [phone]              Allergies  Allergen Reactions  . Statins Other (See Comments)    Muscle aches  . Gabapentin Other (See Comments)    SOMNOLENCE   Procedures/Studies: CT Angio Chest PE W/Cm &/Or Wo Cm  Result Date: 02/14/2020 CLINICAL DATA:  Shortness of breath and cough. EXAM: CT ANGIOGRAPHY CHEST WITH CONTRAST TECHNIQUE: Multidetector CT imaging of the chest was performed using the standard protocol during bolus administration of intravenous contrast. Multiplanar CT image reconstructions and MIPs were obtained to evaluate the vascular anatomy. CONTRAST:  88mL OMNIPAQUE IOHEXOL 350  MG/ML SOLN COMPARISON:  Chest radiograph February 14, 2020 FINDINGS: Cardiovascular: There is no demonstrable pulmonary embolus. There is no appreciable thoracic aortic aneurysm. No dissection evident. Note that the contrast bolus in the aorta is not sufficient for confident dissection assessment. There are scattered foci of calcification in visualized great vessels. There are foci of aortic atherosclerosis. Patient is status post aortic valve replacement. There are multiple foci of coronary artery calcification. There is extensive calcification of the mitral annulus. There is no pericardial effusion or pericardial thickening. Mediastinum/Nodes: The  visualized thyroid appears normal. No appreciable thoracic adenopathy. No esophageal lesions are appreciable. Lungs/Pleura: There are moderate free-flowing pleural effusions with compressive atelectasis and patchy consolidation in the lung bases. Elsewhere, there are patchy areas of airspace opacity in the mid lung regions as well areas of somewhat mosaic attenuation bilaterally. No appreciable interstitial edema evident. Upper Abdomen: Visualized upper abdominal structures appear unremarkable except for aortic atherosclerosis. Musculoskeletal: There are foci of degenerative change in the thoracic spine. No blastic or lytic bone lesions. No evident chest wall lesions. Review of the MIP images confirms the above findings. IMPRESSION: 1. No demonstrable pulmonary embolus. No thoracic aortic aneurysm. No dissection evident; note that the contrast bolus in the aorta is not sufficient for confident dissection assessment. There is aortic atherosclerosis as well as great vessel and coronary artery calcification. There is mitral annulus calcification. Patient is status post aortic valve replacement. 2. Pleural effusions bilaterally with combination of atelectasis and compressive atelectasis in both lung bases. Scattered areas of ill-defined airspace opacity bilaterally as well as areas of mosaic attenuation. It is difficult to ascertain whether these areas represent foci of pneumonia versus a degree of pulmonary edema. Both entities may be present concurrently. There is concern for potential degree of congestive heart failure with superimposed pneumonia. The mosaic attenuation the lungs may indicate a degree of superimposed small airways obstructive disease. 3.  No adenopathy. Aortic Atherosclerosis (ICD10-I70.0). Electronically Signed   By: Lowella Grip III M.D.   On: 02/14/2020 14:38   DG Chest Portable 1 View  Result Date: 02/14/2020 CLINICAL DATA:  Shortness of breath, CHF. EXAM: PORTABLE CHEST 1 VIEW  COMPARISON:  12/30/2019 FINDINGS: Low lung volumes.  Cardiomegaly as on the prior study. Signs of trans arterial aortic valve replacement. Increased interstitial markings bilaterally. LEFT lower lobe airspace disease with partially obscured lateral LEFT hemidiaphragm. On limited assessment no acute musculoskeletal finding. IMPRESSION: Low volume chest with findings suggestive of CHF or volume overload. Basilar opacities on the LEFT do not allow for exclusion of concomitant pneumonia. Electronically Signed   By: Zetta Bills M.D.   On: 02/14/2020 09:07     Subjective: Eager to go home today  Discharge Exam: Vitals:   02/17/20 0917 02/17/20 1347  BP:  (!) 113/59  Pulse:  98  Resp: 15 18  Temp:  98 F (36.7 C)  SpO2:  95%   Vitals:   02/17/20 0452 02/17/20 0600 02/17/20 0917 02/17/20 1347  BP: (!) 128/48   (!) 113/59  Pulse: 85   98  Resp: 20  15 18   Temp: 98.1 F (36.7 C)   98 F (36.7 C)  TempSrc: Oral   Oral  SpO2: 90%   95%  Weight:  66.2 kg    Height:        General: Pt is alert, awake, not in acute distress Cardiovascular: RRR, S1/S2 +, no rubs, no gallops Respiratory: CTA bilaterally, no wheezing, no rhonchi Abdominal: Soft, NT, ND, bowel sounds +  Extremities: no edema, no cyanosis   The results of significant diagnostics from this hospitalization (including imaging, microbiology, ancillary and laboratory) are listed below for reference.     Microbiology: Recent Results (from the past 240 hour(s))  Respiratory Panel by RT PCR (Flu A&B, Covid) - Nasopharyngeal Swab     Status: None   Collection Time: 02/14/20  9:20 AM   Specimen: Nasopharyngeal Swab  Result Value Ref Range Status   SARS Coronavirus 2 by RT PCR NEGATIVE NEGATIVE Final    Comment: (NOTE) SARS-CoV-2 target nucleic acids are NOT DETECTED.  The SARS-CoV-2 RNA is generally detectable in upper respiratoy specimens during the acute phase of infection. The lowest concentration of SARS-CoV-2 viral  copies this assay can detect is 131 copies/mL. A negative result does not preclude SARS-Cov-2 infection and should not be used as the sole basis for treatment or other patient management decisions. A negative result may occur with  improper specimen collection/handling, submission of specimen other than nasopharyngeal swab, presence of viral mutation(s) within the areas targeted by this assay, and inadequate number of viral copies (<131 copies/mL). A negative result must be combined with clinical observations, patient history, and epidemiological information. The expected result is Negative.  Fact Sheet for Patients:  PinkCheek.be  Fact Sheet for Healthcare Providers:  GravelBags.it  This test is no t yet approved or cleared by the [place] FDA and  has been authorized for detection and/or diagnosis of SARS-CoV-2 by FDA under an Emergency Use Authorization (EUA). This EUA will remain  in effect (meaning this test can be used) for the duration of the COVID-19 declaration under Section 564(b)(1) of the Act, 21 U.S.C. section 360bbb-3(b)(1), unless the authorization is terminated or revoked sooner.     Influenza A by PCR NEGATIVE NEGATIVE Final   Influenza B by PCR NEGATIVE NEGATIVE Final    Comment: (NOTE) The Xpert Xpress SARS-CoV-2/FLU/RSV assay is intended as an aid in  the diagnosis of influenza from Nasopharyngeal swab specimens and  should not be used as a sole basis for treatment. Nasal washings and  aspirates are unacceptable for Xpert Xpress SARS-CoV-2/FLU/RSV  testing.  Fact Sheet for Patients: PinkCheek.be  Fact Sheet for Healthcare Providers: GravelBags.it  This test is not yet approved or cleared by the [place] FDA and  has been authorized for detection and/or diagnosis of SARS-CoV-2 by  FDA under an Emergency Use Authorization (EUA). This  EUA will remain  in effect (meaning this test can be used) for the duration of the  Covid-19 declaration under Section 564(b)(1) of the Act, 21  U.S.C. section 360bbb-3(b)(1), unless the authorization is  terminated or revoked. Performed at [facility], [place] [address], [place], [place] [address]   Culture, blood (routine x 2)     Status: None (Preliminary result)   Collection Time: 02/14/20  4:45 PM   Specimen: BLOOD  Result Value Ref Range Status   Specimen Description   Final    BLOOD LEFT ANTECUBITAL Performed at [place] [address] [place], [place] [address]    Special Requests   Final    BOTTLES DRAWN AEROBIC AND ANAEROBIC Blood Culture adequate volume Performed at [place] [address] [place], [place] [address]    Culture   Final    NO GROWTH 3 DAYS Performed at [facility], [place] [address] [place],  [address]    Report Status PENDING  Incomplete  Culture, blood (routine x 2)  Status: None (Preliminary result)   Collection Time: 02/14/20  4:45 PM   Specimen: BLOOD  Result Value Ref Range Status   Specimen Description   Final    BLOOD RIGHT ANTECUBITAL Performed at [place] [address] [place], [place] [address]    Special Requests   Final    BOTTLES DRAWN AEROBIC AND ANAEROBIC Blood Culture adequate volume Performed at [place] [address], [place], [place] [address]    Culture   Final    NO GROWTH 3 DAYS Performed at [facility], [place] [address], [place], [place] [address]    Report Status PENDING  Incomplete     Labs: BNP (last 3 results) Recent Labs    12/31/19 0554 02/03/20 1220 02/14/20 0914  BNP 368.2* 521* 660.6*   Basic Metabolic Panel: Recent Labs  Lab 02/14/20 0914 02/15/20 0543 02/16/20 0538 02/17/20 0519  NA 141 139 137 138  K 4.1 3.5 5.2* 4.6  CL 104 100 100 99  CO2 27 30 29 25    GLUCOSE 140* 87 99 100*  BUN 34* 31* 40* 47*  CREATININE 1.26* 1.08* 1.68* 1.79*  CALCIUM 9.1 8.4* 8.3* 8.6*  MG  --   --  2.3  --    Liver Function Tests: Recent Labs  Lab 02/14/20 0914  AST 20  ALT 13  ALKPHOS 88  BILITOT 0.8  PROT 7.8  ALBUMIN 3.6   No results for input(s): LIPASE, AMYLASE in the last 168 hours. No results for input(s): AMMONIA in the last 168 hours. CBC: Recent Labs  Lab 02/14/20 0914 02/15/20 0543 02/16/20 0538 02/17/20 0519  WBC 8.4 6.4 5.9 4.6  NEUTROABS 7.0  --   --   --   HGB 10.7* 10.0* 10.3* 10.3*  HCT 34.3* 32.3* 32.6* 31.1*  MCV 99.7 98.8 98.5 94.2  PLT 250 229 239 PLATELET CLUMPS NOTED ON SMEAR, UNABLE TO ESTIMATE   Cardiac Enzymes: No results for input(s): CKTOTAL, CKMB, CKMBINDEX, TROPONINI in the last 168 hours. BNP: Invalid input(s): POCBNP CBG: No results for input(s): GLUCAP in the last 168 hours. D-Dimer No results for input(s): DDIMER in the last 72 hours. Hgb A1c No results for input(s): HGBA1C in the last 72 hours. Lipid Profile No results for input(s): CHOL, HDL, LDLCALC, TRIG, CHOLHDL, LDLDIRECT in the last 72 hours. Thyroid function studies No results for input(s): TSH, T4TOTAL, T3FREE, THYROIDAB in the last 72 hours.  Invalid input(s): FREET3 Anemia work up No results for input(s): VITAMINB12, FOLATE, FERRITIN, TIBC, IRON, RETICCTPCT in the last 72 hours. Urinalysis    Component Value Date/Time   COLORURINE YELLOW 09/17/2016 1155   APPEARANCEUR [place] (A) 09/17/2016 1155   LABSPEC 1.025 09/17/2016 1155   PHURINE 5.0 09/17/2016 1155   GLUCOSEU NEGATIVE 09/17/2016 1155   HGBUR NEGATIVE 09/17/2016 1155   BILIRUBINUR NEGATIVE 09/17/2016 1155   BILIRUBINUR n 06/18/2016 1016   KETONESUR 15 (A) 09/17/2016 1155   PROTEINUR n 06/18/2016 1016   PROTEINUR NEGATIVE 06/02/2016 1753   UROBILINOGEN 0.2 09/17/2016 1155   NITRITE NEGATIVE 09/17/2016 1155   LEUKOCYTESUR TRACE (A) 09/17/2016 1155   Sepsis  Labs Invalid input(s): PROCALCITONIN,  WBC,  LACTICIDVEN Microbiology Recent Results (from the past 240 hour(s))  Respiratory Panel by RT PCR (Flu A&B, Covid) - Nasopharyngeal Swab     Status: None   Collection Time: 02/14/20  9:20 AM   Specimen: Nasopharyngeal Swab  Result Value Ref Range Status   SARS Coronavirus 2  by RT PCR NEGATIVE NEGATIVE Final    Comment: (NOTE) SARS-CoV-2 target nucleic acids are NOT DETECTED.  The SARS-CoV-2 RNA is generally detectable in upper respiratoy specimens during the acute phase of infection. The lowest concentration of SARS-CoV-2 viral copies this assay can detect is 131 copies/mL. A negative result does not preclude SARS-Cov-2 infection and should not be used as the sole basis for treatment or other patient management decisions. A negative result may occur with  improper specimen collection/handling, submission of specimen other than nasopharyngeal swab, presence of viral mutation(s) within the areas targeted by this assay, and inadequate number of viral copies (<131 copies/mL). A negative result must be combined with clinical observations, patient history, and epidemiological information. The expected result is Negative.  Fact Sheet for Patients:  PinkCheek.be  Fact Sheet for Healthcare Providers:  GravelBags.it  This test is no t yet approved or cleared by the [place] FDA and  has been authorized for detection and/or diagnosis of SARS-CoV-2 by FDA under an Emergency Use Authorization (EUA). This EUA will remain  in effect (meaning this test can be used) for the duration of the COVID-19 declaration under Section 564(b)(1) of the Act, 21 U.S.C. section 360bbb-3(b)(1), unless the authorization is terminated or revoked sooner.     Influenza A by PCR NEGATIVE NEGATIVE Final   Influenza B by PCR NEGATIVE NEGATIVE Final    Comment: (NOTE) The Xpert Xpress SARS-CoV-2/FLU/RSV assay  is intended as an aid in  the diagnosis of influenza from Nasopharyngeal swab specimens and  should not be used as a sole basis for treatment. Nasal washings and  aspirates are unacceptable for Xpert Xpress SARS-CoV-2/FLU/RSV  testing.  Fact Sheet for Patients: PinkCheek.be  Fact Sheet for Healthcare Providers: GravelBags.it  This test is not yet approved or cleared by the [place] FDA and  has been authorized for detection and/or diagnosis of SARS-CoV-2 by  FDA under an Emergency Use Authorization (EUA). This EUA will remain  in effect (meaning this test can be used) for the duration of the  Covid-19 declaration under Section 564(b)(1) of the Act, 21  U.S.C. section 360bbb-3(b)(1), unless the authorization is  terminated or revoked. Performed at [facility], [place] [address] [place], [place] [address]   Culture, blood (routine x 2)     Status: None (Preliminary result)   Collection Time: 02/14/20  4:45 PM   Specimen: BLOOD  Result Value Ref Range Status   Specimen Description   Final    BLOOD LEFT ANTECUBITAL Performed at [facility] [address], [place], [place] [address]    Special Requests   Final    BOTTLES DRAWN AEROBIC AND ANAEROBIC Blood Culture adequate volume Performed at [facility] [address], [place], [place] [address]    Culture   Final    NO GROWTH 3 DAYS Performed at [facility], [place] [address], [place], [place] [address]    Report Status PENDING  Incomplete  Culture, blood (routine x 2)     Status: None (Preliminary result)   Collection Time: 02/14/20  4:45 PM   Specimen: BLOOD  Result Value Ref Range Status   Specimen Description   Final    BLOOD RIGHT ANTECUBITAL Performed at [place] [address], [place], [place] [address]    Special Requests   Final    BOTTLES DRAWN AEROBIC AND ANAEROBIC  Blood Culture adequate volume Performed at [place]  [address], [place], [place] [address]    Culture   Final    NO GROWTH 3 DAYS Performed at [facility], [place] [address] [place], [place] [address]    Report Status PENDING  Incomplete   Time spent: 30 min  SIGNED:   Marylu Lund, MD  [facility] 02/17/2020, 2:32 PM  If 7PM-7AM, please contact night-coverage

## 2020-02-17 NOTE — Progress Notes (Signed)
Patient discharge home with daughter in attendance via wheelchair. Discharge instructions reviewed verbal and written copy given to daughter.

## 2020-02-17 NOTE — Evaluation (Signed)
Occupational Therapy Evaluation Patient Details Name: Sara Garza MRN: [id number] DOB: 1924/03/31 Today's Date: 02/17/2020    History of Present Illness 86 yo female admitted with CHF. Hx of CAD, HF, DM, CKD, aortic stenosis, [name], LBBB, L hip IM nail 12/2019, R hip hemi 2015   Clinical Impression   Sara Garza is a 86 year old woman admitted to hospital with acute CHF with a recent left hip fracture. On evaluation she demonstrates decreased activity tolerance, decreased ROM and strength of LLE, and impaired balance resulting in decreased independence with ambulation and ADLs. Patient will benefit from skilled OT services while in hospital to improve deficits and learn compensatory strategies as needed in order to return home at [facility].       Follow Up Recommendations  Home health OT    Equipment Recommendations  None recommended by OT    Recommendations for Other Services       Precautions / Restrictions Precautions Precautions: Fall Precaution Comments: monitor O2 Restrictions Weight Bearing Restrictions: No      Mobility Bed Mobility Overal bed mobility: Needs Assistance Bed Mobility: Supine to Sit     Supine to sit: Min assist     General bed mobility comments: Min assist LLE due to reports of hip pain and weakness.    Transfers Overall transfer level: Needs assistance Equipment used: Rolling walker (2 wheeled) Transfers: Sit to/from Stand Sit to Stand: Min guard Stand pivot transfers: Min guard       General transfer comment: Min guard to stand and ambulate to sink with RW and then manuever to recliner.    Balance Overall balance assessment: Needs assistance;History of Falls Sitting-balance support: No upper extremity supported;Feet supported Sitting balance-Leahy Scale: Good     Standing balance support: Bilateral upper extremity supported Standing balance-Leahy Scale: Poor Standing balance comment: reliance on upper extremities                            ADL either performed or assessed with clinical judgement   ADL Overall ADL's : Needs assistance/impaired Eating/Feeding: Independent   Grooming: Standing;Min guard;Oral care;Wash/dry face;Wash/dry Nurse, mental health Details (indicate cue type and reason): stood at sink to perform grooming task with min guard. Rested elbows on sink to steady herself. Upper Body Bathing: Set up;Sitting   Lower Body Bathing: Minimal assistance;Sit to/from stand   Upper Body Dressing : Set up;Sitting   Lower Body Dressing: Moderate assistance;Set up;Sit to/from stand Lower Body Dressing Details (indicate cue type and reason): Mod assist to don socks and shoes without adaptive equipment. Able to don slip on shoes without need of equipment. Toilet Transfer: Min Retail banker and Hygiene: Min guard;Sit to/from stand       Functional mobility during ADLs: Min guard;Rolling walker       Vision Patient Visual Report: No change from baseline       Perception     Praxis      Pertinent Vitals/Pain Pain Assessment: Faces Faces Pain Scale: Hurts a little bit Pain Location: L hip with activity Pain Descriptors / Indicators: Sore;Grimacing Pain Intervention(s): Monitored during session     Hand Dominance Right   Extremity/Trunk Assessment Upper Extremity Assessment Upper Extremity Assessment: Overall WFL for tasks assessed   Lower Extremity Assessment Lower Extremity Assessment: Defer to PT evaluation   Cervical / Trunk Assessment Cervical / Trunk Assessment: Kyphotic   Communication Communication Communication: HOH   Cognition Arousal/Alertness: Awake/alert  Behavior During Therapy: WFL for tasks assessed/performed Overall Cognitive Status: Within Functional Limits for tasks assessed                                     General Comments       Exercises     Shoulder Instructions      Home Living Family/patient  expects to be discharged to:: Private residence Living Arrangements: Alone Available Help at Discharge: Family (daughter has been staying with pt for last 3 weeks (this is temporary)) Type of Home: Apartment Home Access: Stairs to enter [employer] of Steps: 1   Home Layout: One level     Bathroom Shower/Tub: Tub/shower unit         Home Equipment: Environmental consultant - 2 wheels;Walker - 4 wheels;Cane - single point;Adaptive equipment Adaptive Equipment: Sock aid;Reacher Additional Comments: reports not getting into show yet - waiting for grab bars to be installed.      Prior Functioning/Environment Level of Independence: Needs assistance  Gait / Transfers Assistance Needed: uses RW ADL's / Homemaking Assistance Needed: [employer] 2x/week. Also receiving PT/OT. reports being able to perform ADLs with AE            OT Problem List: Decreased activity tolerance;Decreased strength;Impaired balance (sitting and/or standing);Decreased knowledge of use of DME or AE;Pain      OT Treatment/Interventions: Self-care/ADL training;Therapeutic exercise;DME and/or AE instruction;Therapeutic activities;Patient/family education;Balance training    OT Goals(Current goals can be found in the care plan section) Acute Rehab OT Goals Patient Stated Goal: To live alone again OT Goal Formulation: With patient Time For Goal Achievement: 03/02/20 Potential to Achieve Goals: Good  OT Frequency: Min 2X/week   Barriers to D/C:            Co-evaluation              AM-PAC OT "6 Clicks" Daily Activity     Outcome Measure Help from another person eating meals?: None Help from another person taking care of personal grooming?: A Little Help from another person toileting, which includes using toliet, bedpan, or urinal?: A Little Help from another person bathing (including washing, rinsing, drying)?: A Little Help from another person to put on and taking off regular upper body clothing?: A  Little Help from another person to put on and taking off regular lower body clothing?: A Lot 6 Click Score: 18   End of Session Equipment Utilized During Treatment: Rolling walker;Gait belt Nurse Communication: Mobility status  Activity Tolerance: Patient tolerated treatment well Patient left: in chair;with call bell/phone within reach;with chair alarm set  OT Visit Diagnosis: Other abnormalities of gait and mobility (R26.89);Muscle weakness (generalized) (M62.81);Pain Pain - Right/Left: Left Pain - part of body: Hip                Time: 6195-0932 OT Time Calculation (min): 17 min Charges:  OT General Charges $OT Visit: 1 Visit OT Evaluation $OT Eval Moderate Complexity: 1 Mod  Jacqualyn Sedgwick, OTR/L [place]  Office [phone] Pager: [place] 02/17/2020, 9:52 AM

## 2020-02-18 ENCOUNTER — Telehealth: Payer: Self-pay

## 2020-02-18 NOTE — Telephone Encounter (Signed)
Per Dr. Burt Knack and Dr. Wyline Copas, scheduled the patient for visit 12/1. She was grateful for call and agrees with plan.

## 2020-02-18 NOTE — Telephone Encounter (Signed)
Transition Care Management Follow-up Telephone Call  Date of discharge and from where: 02/17/2020, [facility]   How have you been since you were released from the hospital? Patient states that she is feeling better. No complaints at this time.   Any questions or concerns? No  Items Reviewed:  Did the pt receive and understand the discharge instructions provided? Yes   Medications obtained and verified? Yes   Other? No   Any new allergies since your discharge? No   Dietary orders reviewed? Yes  Do you have support at home? Yes   Home Care and Equipment/Supplies: Were home health services ordered? yes If so, what is the name of the agency? [facility]  Has the agency set up a time to come to the patient's home? yes Were any new equipment or medical supplies ordered?  No What is the name of the medical supply agency? N/A Were you able to get the supplies/equipment? not applicable Do you have any questions related to the use of the equipment or supplies? No  Functional Questionnaire: (I = Independent and D = Dependent) ADLs: I  Bathing/Dressing- I  Meal Prep- I  Eating- I  Maintaining continence- I  Transferring/Ambulation- I  Managing Meds- I  Follow up appointments reviewed:   PCP Hospital f/u appt confirmed? Yes  Scheduled to see Beaulah Dinning, NP on 03/07/2020 @ 10:30 am.  [facility] f/u appt confirmed? Yes  Scheduled to see Dr. Burt Knack on 03/01/2020.  Are transportation arrangements needed? No   If their condition worsens, is the pt aware to call PCP or go to the Emergency Dept.? Yes  Was the patient provided with contact information for the PCP's office or ED? Yes  Was to pt encouraged to call back with questions or concerns? Yes

## 2020-02-19 DIAGNOSIS — D62 Acute posthemorrhagic anemia: Secondary | ICD-10-CM | POA: Diagnosis not present

## 2020-02-19 DIAGNOSIS — I130 Hypertensive heart and chronic kidney disease with heart failure and stage 1 through stage 4 chronic kidney disease, or unspecified chronic kidney disease: Secondary | ICD-10-CM | POA: Diagnosis not present

## 2020-02-19 DIAGNOSIS — I5033 Acute on chronic diastolic (congestive) heart failure: Secondary | ICD-10-CM | POA: Diagnosis not present

## 2020-02-19 DIAGNOSIS — E1122 Type 2 diabetes mellitus with diabetic chronic kidney disease: Secondary | ICD-10-CM | POA: Diagnosis not present

## 2020-02-19 DIAGNOSIS — S72142D Displaced intertrochanteric fracture of left femur, subsequent encounter for closed fracture with routine healing: Secondary | ICD-10-CM | POA: Diagnosis not present

## 2020-02-19 DIAGNOSIS — N183 Chronic kidney disease, stage 3 unspecified: Secondary | ICD-10-CM | POA: Diagnosis not present

## 2020-02-19 LAB — CULTURE, BLOOD (ROUTINE X 2)
Culture: NO GROWTH
Culture: NO GROWTH
Special Requests: ADEQUATE
Special Requests: ADEQUATE

## 2020-02-21 ENCOUNTER — Telehealth: Payer: Self-pay | Admitting: Adult Health

## 2020-02-21 DIAGNOSIS — S72145D Nondisplaced intertrochanteric fracture of left femur, subsequent encounter for closed fracture with routine healing: Secondary | ICD-10-CM | POA: Diagnosis not present

## 2020-02-21 NOTE — Telephone Encounter (Signed)
Sara Garza, PT is calling in 02/19/2020 need verbal 1 week 2 2 week 2 1 week 2 strengtening, gait, mobility, balancing and education for heart failure therapy and home health aide to assist with home health care.  Per Sara Garza can leave a verbal msg on his secured line.

## 2020-02-22 DIAGNOSIS — I5033 Acute on chronic diastolic (congestive) heart failure: Secondary | ICD-10-CM | POA: Diagnosis not present

## 2020-02-22 DIAGNOSIS — I130 Hypertensive heart and chronic kidney disease with heart failure and stage 1 through stage 4 chronic kidney disease, or unspecified chronic kidney disease: Secondary | ICD-10-CM | POA: Diagnosis not present

## 2020-02-22 DIAGNOSIS — E1122 Type 2 diabetes mellitus with diabetic chronic kidney disease: Secondary | ICD-10-CM | POA: Diagnosis not present

## 2020-02-22 DIAGNOSIS — N183 Chronic kidney disease, stage 3 unspecified: Secondary | ICD-10-CM | POA: Diagnosis not present

## 2020-02-22 DIAGNOSIS — D62 Acute posthemorrhagic anemia: Secondary | ICD-10-CM | POA: Diagnosis not present

## 2020-02-22 DIAGNOSIS — S72142D Displaced intertrochanteric fracture of left femur, subsequent encounter for closed fracture with routine healing: Secondary | ICD-10-CM | POA: Diagnosis not present

## 2020-02-22 NOTE — Telephone Encounter (Signed)
Spoke with Roselie Awkward and informed him of the verbal orders as below.

## 2020-02-22 NOTE — Telephone Encounter (Signed)
Ok for verbal orders ?

## 2020-02-23 ENCOUNTER — Telehealth: Payer: Self-pay | Admitting: Adult Health

## 2020-02-23 DIAGNOSIS — I5033 Acute on chronic diastolic (congestive) heart failure: Secondary | ICD-10-CM | POA: Diagnosis not present

## 2020-02-23 DIAGNOSIS — I130 Hypertensive heart and chronic kidney disease with heart failure and stage 1 through stage 4 chronic kidney disease, or unspecified chronic kidney disease: Secondary | ICD-10-CM | POA: Diagnosis not present

## 2020-02-23 DIAGNOSIS — N183 Chronic kidney disease, stage 3 unspecified: Secondary | ICD-10-CM | POA: Diagnosis not present

## 2020-02-23 DIAGNOSIS — D62 Acute posthemorrhagic anemia: Secondary | ICD-10-CM | POA: Diagnosis not present

## 2020-02-23 DIAGNOSIS — E1122 Type 2 diabetes mellitus with diabetic chronic kidney disease: Secondary | ICD-10-CM | POA: Diagnosis not present

## 2020-02-23 DIAGNOSIS — S72142D Displaced intertrochanteric fracture of left femur, subsequent encounter for closed fracture with routine healing: Secondary | ICD-10-CM | POA: Diagnosis not present

## 2020-02-23 NOTE — Telephone Encounter (Signed)
Sara Garza completed the home OT evaluation today and need verbal orders for Home Health OT 1 week 1 2 week 3 1 week 1.  May leave detail msg on secured voice mail.

## 2020-02-24 DIAGNOSIS — M47814 Spondylosis without myelopathy or radiculopathy, thoracic region: Secondary | ICD-10-CM | POA: Diagnosis not present

## 2020-02-24 DIAGNOSIS — Z955 Presence of coronary angioplasty implant and graft: Secondary | ICD-10-CM | POA: Diagnosis not present

## 2020-02-24 DIAGNOSIS — I130 Hypertensive heart and chronic kidney disease with heart failure and stage 1 through stage 4 chronic kidney disease, or unspecified chronic kidney disease: Secondary | ICD-10-CM | POA: Diagnosis not present

## 2020-02-24 DIAGNOSIS — J9601 Acute respiratory failure with hypoxia: Secondary | ICD-10-CM | POA: Diagnosis not present

## 2020-02-24 DIAGNOSIS — I0981 Rheumatic heart failure: Secondary | ICD-10-CM | POA: Diagnosis not present

## 2020-02-24 DIAGNOSIS — I6522 Occlusion and stenosis of left carotid artery: Secondary | ICD-10-CM | POA: Diagnosis not present

## 2020-02-24 DIAGNOSIS — I5033 Acute on chronic diastolic (congestive) heart failure: Secondary | ICD-10-CM | POA: Diagnosis not present

## 2020-02-24 DIAGNOSIS — R32 Unspecified urinary incontinence: Secondary | ICD-10-CM | POA: Diagnosis not present

## 2020-02-24 DIAGNOSIS — S72142D Displaced intertrochanteric fracture of left femur, subsequent encounter for closed fracture with routine healing: Secondary | ICD-10-CM | POA: Diagnosis not present

## 2020-02-24 DIAGNOSIS — I052 Rheumatic mitral stenosis with insufficiency: Secondary | ICD-10-CM | POA: Diagnosis not present

## 2020-02-24 DIAGNOSIS — I447 Left bundle-branch block, unspecified: Secondary | ICD-10-CM | POA: Diagnosis not present

## 2020-02-24 DIAGNOSIS — D631 Anemia in chronic kidney disease: Secondary | ICD-10-CM | POA: Diagnosis not present

## 2020-02-24 DIAGNOSIS — I700 Atherosclerosis of aorta: Secondary | ICD-10-CM | POA: Diagnosis not present

## 2020-02-24 DIAGNOSIS — Z952 Presence of prosthetic heart valve: Secondary | ICD-10-CM | POA: Diagnosis not present

## 2020-02-24 DIAGNOSIS — I2510 Atherosclerotic heart disease of native coronary artery without angina pectoris: Secondary | ICD-10-CM | POA: Diagnosis not present

## 2020-02-24 DIAGNOSIS — N1832 Chronic kidney disease, stage 3b: Secondary | ICD-10-CM | POA: Diagnosis not present

## 2020-02-24 DIAGNOSIS — Z7982 Long term (current) use of aspirin: Secondary | ICD-10-CM | POA: Diagnosis not present

## 2020-02-24 DIAGNOSIS — H409 Unspecified glaucoma: Secondary | ICD-10-CM | POA: Diagnosis not present

## 2020-02-24 DIAGNOSIS — J9811 Atelectasis: Secondary | ICD-10-CM | POA: Diagnosis not present

## 2020-02-24 DIAGNOSIS — M810 Age-related osteoporosis without current pathological fracture: Secondary | ICD-10-CM | POA: Diagnosis not present

## 2020-02-24 DIAGNOSIS — D62 Acute posthemorrhagic anemia: Secondary | ICD-10-CM | POA: Diagnosis not present

## 2020-02-24 DIAGNOSIS — M1712 Unilateral primary osteoarthritis, left knee: Secondary | ICD-10-CM | POA: Diagnosis not present

## 2020-02-24 DIAGNOSIS — E1122 Type 2 diabetes mellitus with diabetic chronic kidney disease: Secondary | ICD-10-CM | POA: Diagnosis not present

## 2020-02-24 DIAGNOSIS — E1142 Type 2 diabetes mellitus with diabetic polyneuropathy: Secondary | ICD-10-CM | POA: Diagnosis not present

## 2020-02-24 DIAGNOSIS — E785 Hyperlipidemia, unspecified: Secondary | ICD-10-CM | POA: Diagnosis not present

## 2020-02-28 DIAGNOSIS — I5033 Acute on chronic diastolic (congestive) heart failure: Secondary | ICD-10-CM | POA: Diagnosis not present

## 2020-02-28 DIAGNOSIS — I130 Hypertensive heart and chronic kidney disease with heart failure and stage 1 through stage 4 chronic kidney disease, or unspecified chronic kidney disease: Secondary | ICD-10-CM | POA: Diagnosis not present

## 2020-02-28 DIAGNOSIS — D631 Anemia in chronic kidney disease: Secondary | ICD-10-CM | POA: Diagnosis not present

## 2020-02-28 DIAGNOSIS — I0981 Rheumatic heart failure: Secondary | ICD-10-CM | POA: Diagnosis not present

## 2020-02-28 DIAGNOSIS — N1832 Chronic kidney disease, stage 3b: Secondary | ICD-10-CM | POA: Diagnosis not present

## 2020-02-28 DIAGNOSIS — E1122 Type 2 diabetes mellitus with diabetic chronic kidney disease: Secondary | ICD-10-CM | POA: Diagnosis not present

## 2020-02-28 NOTE — Telephone Encounter (Signed)
VO given.

## 2020-02-28 NOTE — Telephone Encounter (Signed)
Can you advise on VO? Sara Garza is out until 12/7.

## 2020-02-28 NOTE — Telephone Encounter (Signed)
OK 

## 2020-02-29 DIAGNOSIS — D631 Anemia in chronic kidney disease: Secondary | ICD-10-CM | POA: Diagnosis not present

## 2020-02-29 DIAGNOSIS — E1122 Type 2 diabetes mellitus with diabetic chronic kidney disease: Secondary | ICD-10-CM | POA: Diagnosis not present

## 2020-02-29 DIAGNOSIS — I130 Hypertensive heart and chronic kidney disease with heart failure and stage 1 through stage 4 chronic kidney disease, or unspecified chronic kidney disease: Secondary | ICD-10-CM | POA: Diagnosis not present

## 2020-02-29 DIAGNOSIS — N1832 Chronic kidney disease, stage 3b: Secondary | ICD-10-CM | POA: Diagnosis not present

## 2020-02-29 DIAGNOSIS — I0981 Rheumatic heart failure: Secondary | ICD-10-CM | POA: Diagnosis not present

## 2020-02-29 DIAGNOSIS — I5033 Acute on chronic diastolic (congestive) heart failure: Secondary | ICD-10-CM | POA: Diagnosis not present

## 2020-03-01 ENCOUNTER — Other Ambulatory Visit: Payer: Self-pay | Admitting: Adult Health

## 2020-03-01 ENCOUNTER — Encounter: Payer: Self-pay | Admitting: Cardiovascular Disease

## 2020-03-01 ENCOUNTER — Other Ambulatory Visit: Payer: Self-pay

## 2020-03-01 ENCOUNTER — Ambulatory Visit (INDEPENDENT_AMBULATORY_CARE_PROVIDER_SITE_OTHER): Payer: Medicare Other | Admitting: Cardiovascular Disease

## 2020-03-01 VITALS — BP 138/74 | HR 100 | Ht 59.0 in | Wt 150.0 lb

## 2020-03-01 DIAGNOSIS — I25119 Atherosclerotic heart disease of native coronary artery with unspecified angina pectoris: Secondary | ICD-10-CM | POA: Diagnosis not present

## 2020-03-01 DIAGNOSIS — I359 Nonrheumatic aortic valve disorder, unspecified: Secondary | ICD-10-CM | POA: Diagnosis not present

## 2020-03-01 DIAGNOSIS — I5032 Chronic diastolic (congestive) heart failure: Secondary | ICD-10-CM | POA: Diagnosis not present

## 2020-03-01 MED ORDER — FUROSEMIDE 40 MG PO TABS: 40.0000 mg | ORAL_TABLET | Freq: Every day | ORAL | 3 refills | Status: DC

## 2020-03-01 MED ORDER — AMLODIPINE BESYLATE 5 MG PO TABS: 5.0000 mg | ORAL_TABLET | Freq: Every day | ORAL | 3 refills | Status: DC

## 2020-03-01 MED ORDER — CARVEDILOL 3.125 MG PO TABS: 3.1250 mg | ORAL_TABLET | Freq: Two times a day (BID) | ORAL | 3 refills | Status: DC

## 2020-03-01 NOTE — Patient Instructions (Addendum)
Medication Instructions:  1) DECREASE AMLODIPINE to 5 mg daily 2) START COREG (carvedilol) 3.125 mg twice daily *If you need a refill on your cardiac medications before your next appointment, please call your pharmacy*   Follow-Up: You are scheduled with Dr. Burt Knack on April 21, 2020 at 4:00PM.  Please call us if you gain 3 pounds from your base weight (142lbs)!

## 2020-03-01 NOTE — Progress Notes (Signed)
Cardiology Office Note:    Date:  03/01/2020   ID:  Sara Garza, DOB 1923/11/15, MRN [id number]  PCP:  [name], NP  [facility] Cardiologist:  Sherren Mocha, MD  [place] Electrophysiologist:  None   Referring MD: [name], NP   Chief Complaint  Patient presents with  . Shortness of Breath    History of Present Illness:    Sara Garza is a 86 y.o. female with a hx of coronary artery disease, aortic stenosis, and chronic diastolic heart failure, presenting for hospital follow-up evaluation.  She underwent for [name] for treatment of severe symptomatic aortic stenosis in 2014. She was treated with a 23 mm Medtronic Corevalve through the moderate risk SurTAVI aortic stenosis trial. She also is followed for moderate coronary artery disease which was evaluated with pressure wire analysis of the RCA and LCx demonstrating no hemodynamic evidence of flow-obstruction. 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 Since the patient was last seen here in July 2021, she was hospitalized with a hip fracture and required surgery.  She was then discharged to [facility] home for rehab.  She developed increasing lower extremity edema that was treated with a higher dose of furosemide.  Following discharge back to her home, she began to experience increased shortness of breath, orthopnea, and hemoptysis.  She was hospitalized again on November 15 through February 17, 2020 with decompensated heart failure.  She was treated with IV diuretics and diuresed well with the 4 L negative fluid loss and fairly rapid resolution of her symptoms.  The patient is here with her daughter today.  She is doing well.  She has been weighing in every day at her dry weight at home has been 142 pounds on a consistent basis.  She continues to take furosemide 40 mg daily.  She admits that prior to this hospital  admission she was taking furosemide intermittently and would not take it on days that she had to leave the [facility].  She has been much more regular with her furosemide dosing since her discharge from the hospital.  She denies recurrence of orthopnea or PND.  Her leg swelling is about the same, good when she first wakes up in the mornings and increases throughout the day.  She has had no chest pain or pressure.  She remains short of breath with activity.  Past Medical History:  Diagnosis Date  . Arthritis   . CAD (coronary artery disease)   . Carotid stenosis, left    50-60% stable  . Diabetes mellitus   . Glaucoma   . Hyperlipidemia   . Hypertension   . Leg pain    ABIs 2/18: normal bilaterally.  . Osteoporosis   . Valvular heart disease    Aortic Stenosis s/p [name] 2014 // Mitral stenosis // Echo 09/2018: EF > 65, mod LVH, severe focal basal hypertrophy, RVSP 39.8, mild to mod MR, mod to severe MS (mean 9), AVR with mild AI, severe LAE (similar to prior echo)    Past Surgical History:  Procedure Laterality Date  . ANOMALOUS PULMONARY VENOUS RETURN REPAIR, TOTAL    . APPENDECTOMY    . CARDIAC VALVE REPLACEMENT  04/29/2012  . Cataracts Bilateral   . CESAREAN SECTION    . FOOT SURGERY     [name]  . HIP ARTHROPLASTY Right 07/31/2013   Procedure: ARTHROPLASTY BIPOLAR HIP;  Surgeon: Rodman Key  Marian Sorrow, MD;  Location: [facility] ORS;  Service: Orthopedics;  Laterality: Right;  . INTRAMEDULLARY (IM) NAIL INTERTROCHANTERIC Left 01/01/2020   Procedure: INTRAMEDULLARY (IM) NAIL INTERTROCHANTRIC;  Surgeon: Rod Can, MD;  Location: [facility] ORS;  Service: Orthopedics;  Laterality: Left;  . PERCUTANEOUS CORONARY STENT INTERVENTION (PCI-S) N/A 01/02/2012   Procedure: PERCUTANEOUS CORONARY STENT INTERVENTION (PCI-S);  Surgeon: Sherren Mocha, MD;  Location: [facility] CATH LAB;  Service: Cardiovascular;  Laterality: N/A;    Current Medications: Current Meds  Medication Sig  . acetaminophen (TYLENOL) 500 MG tablet  Take 2 tablets (1,000 mg total) by mouth 4 (four) times daily.  [name] amLODipine (NORVASC) 5 MG tablet Take 1 tablet (5 mg total) by mouth daily.  [name] aspirin EC 81 MG tablet Take 81 mg by mouth 2 (two) times daily. Swallow whole.  . bimatoprost (LUMIGAN) 0.01 % SOLN Place 1 drop into both eyes at bedtime.   . Calcium Carb-Cholecalciferol (CALCIUM 500 +D) 500-400 MG-UNIT TABS Take 1 tablet by mouth daily.   [name] ezetimibe (ZETIA) 10 MG tablet TAKE 1 TABLET(10 MG) BY MOUTH DAILY (Patient taking differently: Take 10 mg by mouth daily. )  . furosemide (LASIX) 40 MG tablet Take 1 tablet (40 mg total) by mouth daily.  [name] loperamide (IMODIUM) 2 MG capsule Take 2 mg by mouth as needed for diarrhea or loose stools.  . Multiple Vitamin (MULTIVITAMIN) tablet Take 1 tablet by mouth daily.    . Multiple Vitamins-Minerals (PRESERVISION AREDS 2+MULTI VIT PO) Take 2 capsules by mouth in the morning and at bedtime.  . [DISCONTINUED] amLODipine (NORVASC) 5 MG tablet TAKE 1 AND 1/2 TABLETS(7.5 MG) BY MOUTH DAILY (Patient taking differently: Take 7.5 mg by mouth daily. )  . [DISCONTINUED] furosemide (LASIX) 40 MG tablet Take 1 tablet (40 mg total) by mouth daily.     Allergies:   Statins and Gabapentin   Social History   Socioeconomic History  . Marital status: Widowed    Spouse name: Not on file  . Number of children: Not on file  . Years of education: Not on file  . Highest education level: Not on file  Occupational History  . Not on file  Tobacco Use  . Smoking status: Passive Smoke Exposure - Never Smoker  . Smokeless tobacco: Never Used  Substance and Sexual Activity  . Alcohol use: Yes    Alcohol/week: 0.0 standard drinks    Comment: very seldom  . Drug use: No  . Sexual activity: Not Currently  Other Topics Concern  . Not on file  Social History Narrative   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      She likes to read and she takes classes at [employer] Strain: High Risk  . Difficulty of Paying Living Expenses: Hard  Food Insecurity:   . Worried About Charity fundraiser in the Last Year: Not on file  . Ran Out of Food in the Last Year: Not on file  Transportation Needs: No Transportation Needs  . Lack of Transportation (Medical): No  . Lack of Transportation (Non-Medical): No  Physical Activity:   . Days of Exercise per Week: Not on file  . Minutes of Exercise per Session: Not on file  Stress:   . Feeling of Stress : Not on file  Social Connections:   . Frequency of Communication with Friends and Family: Not on  file  . Frequency of Social Gatherings with Friends and Family: Not on file  . Attends Religious Services: Not on file  . Active Member of Clubs or Organizations: Not on file  . Attends Archivist Meetings: Not on file  . Marital Status: Not on file     Family History: The patient's family history includes COPD in her father; Cancer in her father; Lung cancer in an other family member.  ROS:   Please see the history of present illness.    All other systems reviewed and are negative.  EKGs/Labs/Other Studies Reviewed:    The following studies were reviewed today: Echo 10/14/2019: IMPRESSIONS    1. Aneursym of the mid-intraventricular septum, neck measures 1.5 cm,  contained. Left ventricular ejection fraction, by estimation, is 65 to  70%. The left ventricle has normal function. The left ventricle has no  regional wall motion abnormalities. There  is severe asymmetric left ventricular hypertrophy of the basal-septal  segment. Left ventricular diastolic parameters are consistent with Grade I  diastolic dysfunction (impaired relaxation). Elevated left ventricular  end-diastolic pressure.  2. Right ventricular systolic function is hyperdynamic. The right  ventricular size is normal. There is normal pulmonary artery systolic  pressure.  3. Left  atrial size was severely dilated.  4. The mitral valve is abnormal. Mild mitral valve regurgitation. Mild to  moderate mitral stenosis. The mean mitral valve gradient is 7.5 mmHg with  average heart rate of 70 bpm.  5. The aortic valve has been repaired/replaced. Aortic valve  regurgitation is not visualized. There is a 23 mm Medtronic  CoreValve-Evolut Pro prosthetic ([name]) valve present in the aortic  position. Aortic valve area, by VTI measures 1.21 cm. Aortic  valve mean gradient measures 13.7 mmHg. Aortic valve Vmax measures 2.59  m/s. Peak gradient 27 mmHg.  6. The inferior vena cava is normal in size with greater than 50%  respiratory variability, suggesting right atrial pressure of 3 mmHg.  7. Evidence of atrial level shunting detected by color flow Doppler.  There is a small patent foramen ovale with predominantly left to right  shunting across the atrial septum.   Comparison(s): No significant change from prior study. Changes from prior  study are noted. 10/05/2018: LVEF >65%, severe focal basal septal  hypertrophy, moderaet to severe MS, 23 mm medtronic corevalve, mean  gradient 10 mmHg, peak gradient 24 mmHg.   Recent Labs: 12/01/2019: TSH 1.60 02/14/2020: ALT 13; B Natriuretic Peptide 592.1 02/16/2020: Magnesium 2.3 02/17/2020: BUN 47; Creatinine, Ser 1.79; Hemoglobin 10.3; Platelets PLATELET CLUMPS NOTED ON SMEAR, UNABLE TO ESTIMATE; Potassium 4.6; Sodium 138  Recent Lipid Panel    Component Value Date/Time   CHOL 203 (H) 12/01/2019 1014   TRIG 149 12/01/2019 1014   HDL 42 (L) 12/01/2019 1014   CHOLHDL 4.8 12/01/2019 1014   VLDL 41.6 (H) 11/26/2018 1201   LDLCALC 133 (H) 12/01/2019 1014   LDLDIRECT 143.0 11/26/2018 1201     Risk Assessment/Calculations:       Physical Exam:    VS:  BP 138/74   Pulse 100   Ht 4\' 11"  (1.499 m)   Wt 150 lb (68 kg)   BMI 30.30 kg/m     Wt Readings from Last 3 Encounters:  03/01/20 150 lb (68 kg)  02/17/20 145 lb 14.4  oz (66.2 kg)  02/03/20 157 lb (71.2 kg)     GEN: Pleasant elderly woman in no acute distress HEENT: Normal NECK: No JVD; No carotid bruits LYMPHATICS:  No lymphadenopathy CARDIAC: RRR, 2/6 systolic ejection murmur at the right upper sternal border RESPIRATORY:  Clear to auscultation without rales, wheezing or rhonchi  ABDOMEN: Soft, non-tender, non-distended MUSCULOSKELETAL: Trace edema on the right, 1+ edema on the left lower leg; No deformity  SKIN: Warm and dry NEUROLOGIC:  Alert and oriented x 3 PSYCHIATRIC:  Normal affect   ASSESSMENT:    1. Chronic diastolic CHF (congestive heart failure) ([place])   2. Coronary artery disease involving native coronary artery of native heart with angina pectoris ([place])   3. Aortic valve disease    PLAN:    In order of problems listed above:  1. The patient seems to be doing well after her recent hospitalization with acute on chronic diastolic heart failure.  I talked to her at length today about staying consistent with daily weights, avoiding sodium, and staying on furosemide 40 mg daily.  She understands to call if she gains 3 pounds from her baseline weight and we will temporarily increase her furosemide to 40 mg twice daily as needed.  I did make some adjustments in her medications today with reducing amlodipine to 5 mg daily and starting her back on carvedilol 3.125 mg twice daily. 2. Stable without symptoms of angina on current medical therapy. 3. Stable function of her [name] bioprosthesis on her most recent echocardiogram from July 2021.  Continue routine clinical follow-up.  Shared Decision Making/Informed Consent      Medication Adjustments/Labs and Tests Ordered: Current medicines are reviewed at length with the patient today.  Concerns regarding medicines are outlined above.  No orders of the defined types were placed in this encounter.  Meds ordered this encounter  Medications  . amLODipine (NORVASC) 5 MG tablet    Sig: Take 1 tablet  (5 mg total) by mouth daily.    Dispense:  90 tablet    Refill:  3  . furosemide (LASIX) 40 MG tablet    Sig: Take 1 tablet (40 mg total) by mouth daily.    Dispense:  90 tablet    Refill:  3  . carvedilol (COREG) 3.125 MG tablet    Sig: Take 1 tablet (3.125 mg total) by mouth 2 (two) times daily.    Dispense:  180 tablet    Refill:  3    Patient Instructions  Medication Instructions:  1) DECREASE AMLODIPINE to 5 mg daily 2) START COREG (carvedilol) 3.125 mg twice daily *If you need a refill on your cardiac medications before your next appointment, please call your pharmacy*   Follow-Up: You are scheduled with Dr. Burt Knack on April 21, 2020 at 4:00PM.  Please call us if you gain 3 pounds from your base weight (142lbs)!    Signed, Sherren Mocha, MD  03/01/2020 5:22 PM    [facility]

## 2020-03-02 DIAGNOSIS — D631 Anemia in chronic kidney disease: Secondary | ICD-10-CM | POA: Diagnosis not present

## 2020-03-02 DIAGNOSIS — E1122 Type 2 diabetes mellitus with diabetic chronic kidney disease: Secondary | ICD-10-CM | POA: Diagnosis not present

## 2020-03-02 DIAGNOSIS — I5033 Acute on chronic diastolic (congestive) heart failure: Secondary | ICD-10-CM | POA: Diagnosis not present

## 2020-03-02 DIAGNOSIS — I0981 Rheumatic heart failure: Secondary | ICD-10-CM | POA: Diagnosis not present

## 2020-03-02 DIAGNOSIS — I130 Hypertensive heart and chronic kidney disease with heart failure and stage 1 through stage 4 chronic kidney disease, or unspecified chronic kidney disease: Secondary | ICD-10-CM | POA: Diagnosis not present

## 2020-03-02 DIAGNOSIS — N1832 Chronic kidney disease, stage 3b: Secondary | ICD-10-CM | POA: Diagnosis not present

## 2020-03-03 DIAGNOSIS — D631 Anemia in chronic kidney disease: Secondary | ICD-10-CM | POA: Diagnosis not present

## 2020-03-03 DIAGNOSIS — I130 Hypertensive heart and chronic kidney disease with heart failure and stage 1 through stage 4 chronic kidney disease, or unspecified chronic kidney disease: Secondary | ICD-10-CM | POA: Diagnosis not present

## 2020-03-03 DIAGNOSIS — N1832 Chronic kidney disease, stage 3b: Secondary | ICD-10-CM | POA: Diagnosis not present

## 2020-03-03 DIAGNOSIS — I5033 Acute on chronic diastolic (congestive) heart failure: Secondary | ICD-10-CM | POA: Diagnosis not present

## 2020-03-03 DIAGNOSIS — I0981 Rheumatic heart failure: Secondary | ICD-10-CM | POA: Diagnosis not present

## 2020-03-03 DIAGNOSIS — E1122 Type 2 diabetes mellitus with diabetic chronic kidney disease: Secondary | ICD-10-CM | POA: Diagnosis not present

## 2020-03-06 DIAGNOSIS — I130 Hypertensive heart and chronic kidney disease with heart failure and stage 1 through stage 4 chronic kidney disease, or unspecified chronic kidney disease: Secondary | ICD-10-CM | POA: Diagnosis not present

## 2020-03-06 DIAGNOSIS — E1122 Type 2 diabetes mellitus with diabetic chronic kidney disease: Secondary | ICD-10-CM | POA: Diagnosis not present

## 2020-03-06 DIAGNOSIS — D631 Anemia in chronic kidney disease: Secondary | ICD-10-CM | POA: Diagnosis not present

## 2020-03-06 DIAGNOSIS — N1832 Chronic kidney disease, stage 3b: Secondary | ICD-10-CM | POA: Diagnosis not present

## 2020-03-06 DIAGNOSIS — I0981 Rheumatic heart failure: Secondary | ICD-10-CM | POA: Diagnosis not present

## 2020-03-06 DIAGNOSIS — I5033 Acute on chronic diastolic (congestive) heart failure: Secondary | ICD-10-CM | POA: Diagnosis not present

## 2020-03-07 ENCOUNTER — Other Ambulatory Visit: Payer: Self-pay

## 2020-03-07 ENCOUNTER — Encounter: Payer: Self-pay | Admitting: Adult Health

## 2020-03-07 ENCOUNTER — Ambulatory Visit (INDEPENDENT_AMBULATORY_CARE_PROVIDER_SITE_OTHER): Payer: Medicare Other | Admitting: Adult Health

## 2020-03-07 VITALS — BP 130/60 | HR 78 | Temp 98.1°F | Ht 59.0 in | Wt 150.0 lb

## 2020-03-07 DIAGNOSIS — N1831 Chronic kidney disease, stage 3a: Secondary | ICD-10-CM | POA: Diagnosis not present

## 2020-03-07 DIAGNOSIS — I25119 Atherosclerotic heart disease of native coronary artery with unspecified angina pectoris: Secondary | ICD-10-CM | POA: Diagnosis not present

## 2020-03-07 DIAGNOSIS — I10 Essential (primary) hypertension: Secondary | ICD-10-CM | POA: Diagnosis not present

## 2020-03-07 DIAGNOSIS — I5032 Chronic diastolic (congestive) heart failure: Secondary | ICD-10-CM

## 2020-03-07 DIAGNOSIS — I0981 Rheumatic heart failure: Secondary | ICD-10-CM | POA: Diagnosis not present

## 2020-03-07 DIAGNOSIS — I130 Hypertensive heart and chronic kidney disease with heart failure and stage 1 through stage 4 chronic kidney disease, or unspecified chronic kidney disease: Secondary | ICD-10-CM | POA: Diagnosis not present

## 2020-03-07 DIAGNOSIS — E1122 Type 2 diabetes mellitus with diabetic chronic kidney disease: Secondary | ICD-10-CM | POA: Diagnosis not present

## 2020-03-07 DIAGNOSIS — D631 Anemia in chronic kidney disease: Secondary | ICD-10-CM | POA: Diagnosis not present

## 2020-03-07 DIAGNOSIS — I5033 Acute on chronic diastolic (congestive) heart failure: Secondary | ICD-10-CM | POA: Diagnosis not present

## 2020-03-07 DIAGNOSIS — N1832 Chronic kidney disease, stage 3b: Secondary | ICD-10-CM | POA: Diagnosis not present

## 2020-03-07 NOTE — Patient Instructions (Signed)
It was great seeing you today and I am so happy you are feeling better and out of the hospital   Take your lasix every day!   We will follow up with you regarding the blood work for your kidney function   Let me know if you need anything   Have a very merry christmas

## 2020-03-07 NOTE — Progress Notes (Signed)
Subjective:    Patient ID: Sara Garza, female    DOB: [birth year], 86 y.o.   MRN: [id number]  HPI 86 year old female who  has a past medical history of Arthritis, CAD (coronary artery disease), Carotid stenosis, left, Diabetes mellitus, Glaucoma, Hyperlipidemia, Hypertension, Leg pain, Osteoporosis, and Valvular heart disease.   She Libby Maw with her daughter for hospital follow-up.  She was seen by cardiology on 03/01/2020 status post hospital admission due to chronic diastolic CHF.  She was hospitalized previously with a hip fracture that required surgery and then was discharged to collapse nursing home for rehab.  While at [facility] she developed increasing lower extremity edema that was treated with a higher dose of furosemide.  She was then discharged back home and began to experience increased shortness of breath, Orthopena, and hemoptysis.  She was again hospitalized on November 15 through February 17, 2020 with decompensated heart failure.  During her hospitalization she was treated with IV diuretics and was diuresed about 4 L which resolved her symptoms fairly rapidly.  Since being discharged she reports that she is doing well overall, she still continues to have shortness of breath with exertion but is back to her chronic baseline.  She has been weighing herself and has been consistently 142 LBS.  She is taking her furosemide daily, which is not the case prior to this most recent hospital admission.  Since being discharged she denies recurrence of shortness of breath, chest pain, or difficulty breathing.  Swelling in her legs is stayed relatively the same, smaller when she wakes up in the morning but then increases throughout the day.  While in the hospital her kidney function had decreased to creatinine of 1.7 with a GFR in the 25's, her baseline is creatinine of 1.5, GFR in the 30s to 40s.  This decrease was thought to be due to IV diuretics.  We will recheck today  When she was seen by  cardiology last week Norvasc was decreased to 5 mg and Coreg 3.125 mg was added back to her regimen.    Review of Systems  Constitutional: Negative.   Respiratory: Positive for shortness of breath (with exertion ).   Cardiovascular: Positive for leg swelling.  Genitourinary: Negative.   Musculoskeletal: Positive for gait problem.  Hematological: Negative.   Psychiatric/Behavioral: Negative.   All other systems reviewed and are negative.  Past Medical History:  Diagnosis Date  . Arthritis   . CAD (coronary artery disease)   . Carotid stenosis, left    50-60% stable  . Diabetes mellitus   . Glaucoma   . Hyperlipidemia   . Hypertension   . Leg pain    ABIs 2/18: normal bilaterally.  . Osteoporosis   . Valvular heart disease    Aortic Stenosis s/p [name] 2014 // Mitral stenosis // Echo 09/2018: EF > 65, mod LVH, severe focal basal hypertrophy, RVSP 39.8, mild to mod MR, mod to severe MS (mean 9), AVR with mild AI, severe LAE (similar to prior echo)    Social History   Socioeconomic History  . Marital status: Widowed    Spouse name: Not on file  . Number of children: Not on file  . Years of education: Not on file  . Highest education level: Not on file  Occupational History  . Not on file  Tobacco Use  . Smoking status: Passive Smoke Exposure - Never Smoker  . Smokeless tobacco: Never Used  Substance and Sexual Activity  . Alcohol use: Yes  Alcohol/week: 0.0 standard drinks    Comment: very seldom  . Drug use: No  . Sexual activity: Not Currently  Other Topics Concern  . Not on file  Social History Narrative   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      She likes to read and she takes classes at [employer] Strain: High Risk  . Difficulty of Paying Living Expenses: Hard  Food Insecurity:   . Worried About Charity fundraiser in the Last Year: Not on file  . Ran Out of Food  in the Last Year: Not on file  Transportation Needs: No Transportation Needs  . Lack of Transportation (Medical): No  . Lack of Transportation (Non-Medical): No  Physical Activity:   . Days of Exercise per Week: Not on file  . Minutes of Exercise per Session: Not on file  Stress:   . Feeling of Stress : Not on file  Social Connections:   . Frequency of Communication with Friends and Family: Not on file  . Frequency of Social Gatherings with Friends and Family: Not on file  . Attends Religious Services: Not on file  . Active Member of Clubs or Organizations: Not on file  . Attends Archivist Meetings: Not on file  . Marital Status: Not on file  Intimate Partner Violence:   . Fear of Current or Ex-Partner: Not on file  . Emotionally Abused: Not on file  . Physically Abused: Not on file  . Sexually Abused: Not on file    Past Surgical History:  Procedure Laterality Date  . ANOMALOUS PULMONARY VENOUS RETURN REPAIR, TOTAL    . APPENDECTOMY    . CARDIAC VALVE REPLACEMENT  04/29/2012  . Cataracts Bilateral   . CESAREAN SECTION    . FOOT SURGERY     [name]  . HIP ARTHROPLASTY Right 07/31/2013   Procedure: ARTHROPLASTY BIPOLAR HIP;  Surgeon: [name], MD;  Location: [facility];  Service: Orthopedics;  Laterality: Right;  . INTRAMEDULLARY (IM) NAIL INTERTROCHANTERIC Left 01/01/2020   Procedure: INTRAMEDULLARY (IM) NAIL INTERTROCHANTRIC;  Surgeon: Rod Can, MD;  Location: [facility];  Service: Orthopedics;  Laterality: Left;  . PERCUTANEOUS CORONARY STENT INTERVENTION (PCI-S) N/A 01/02/2012   Procedure: PERCUTANEOUS CORONARY STENT INTERVENTION (PCI-S);  Surgeon: Sherren Mocha, MD;  Location: [facility] CATH LAB;  Service: Cardiovascular;  Laterality: N/A;    Family History  Problem Relation Age of Onset  . COPD Father   . Cancer Father        lung cancer  . Lung cancer Other     Allergies  Allergen Reactions  . Statins Other (See Comments)    Muscle aches  . Gabapentin  Other (See Comments)    SOMNOLENCE    Current Outpatient Medications on File Prior to Visit  Medication Sig Dispense Refill  . acetaminophen (TYLENOL) 500 MG tablet Take 2 tablets (1,000 mg total) by mouth 4 (four) times daily. (Patient taking differently: Take 1,000 mg by mouth in the morning, at noon, and at bedtime. ) 30 tablet 0  . amLODipine (NORVASC) 5 MG tablet Take 1 tablet (5 mg total) by mouth daily. 90 tablet 3  . aspirin EC 81 MG tablet Take 81 mg by mouth daily. Swallow whole.     . bimatoprost (LUMIGAN) 0.01 % SOLN Place 1 drop into both eyes at bedtime.     [name]  Calcium Carb-Cholecalciferol (CALCIUM 500 +D) 500-400 MG-UNIT TABS Take 1 tablet by mouth daily.     . carvedilol (COREG) 3.125 MG tablet Take 1 tablet (3.125 mg total) by mouth 2 (two) times daily. 180 tablet 3  . ezetimibe (ZETIA) 10 MG tablet TAKE 1 TABLET(10 MG) BY MOUTH DAILY (Patient taking differently: Take 10 mg by mouth daily. ) 30 tablet 11  . furosemide (LASIX) 40 MG tablet Take 1 tablet (40 mg total) by mouth daily. 90 tablet 3  . loperamide (IMODIUM) 2 MG capsule Take 2 mg by mouth as needed for diarrhea or loose stools.    . Multiple Vitamin (MULTIVITAMIN) tablet Take 1 tablet by mouth daily.      . Multiple Vitamins-Minerals (PRESERVISION AREDS 2+MULTI VIT PO) Take 2 capsules by mouth in the morning and at bedtime.     No current facility-administered medications on file prior to visit.    BP 130/60 (BP Location: Left Arm, Patient Position: Sitting, Cuff Size: Large)   Pulse 78   Temp 98.1 F (36.7 C) (Oral)   Ht 4\' 11"  (1.499 m)   Wt 150 lb (68 kg)   SpO2 98%   BMI 30.30 kg/m       Objective:   Physical Exam Vitals and nursing note reviewed.  Constitutional:      Appearance: Normal appearance.  Cardiovascular:     Rate and Rhythm: Normal rate and regular rhythm.     Pulses: Normal pulses.     Heart sounds: Murmur heard.   Pulmonary:     Effort: Pulmonary effort is normal.  Abdominal:      General: Abdomen is flat.     Palpations: Abdomen is soft.  Musculoskeletal:        General: Swelling present.     Right lower leg: Edema present.     Left lower leg: Edema present.  Skin:    General: Skin is warm and dry.     Capillary Refill: Capillary refill takes less than 2 seconds.  Neurological:     General: No focal deficit present.     Mental Status: She is alert and oriented to person, place, and time.     Gait: Gait abnormal (walks with walker ).  Psychiatric:        Mood and Affect: Mood normal.        Behavior: Behavior normal.        Thought Content: Thought content normal.        Judgment: Judgment normal.       Assessment & Plan:  1. Chronic diastolic CHF (congestive heart failure) ([place]) -Reviewed hospital notes, discharge instructions, and imaging.  All questions answered to the best of my ability.  She seems to be back at baseline.  Advised to continue to weigh her self and notify cardiology if weight increases by 3 pounds on a regular basis.  Take Lasix every day. - BASIC METABOLIC PANEL WITH GFR; Future - BASIC METABOLIC PANEL WITH GFR  2. Stage 3a chronic kidney disease (HCC)  - BASIC METABOLIC PANEL WITH GFR; Future - BASIC METABOLIC PANEL WITH GFR  3. Essential hypertension -At goal.  No change in medication at this time  [name], NP

## 2020-03-08 ENCOUNTER — Telehealth: Payer: Self-pay | Admitting: Adult Health

## 2020-03-08 DIAGNOSIS — N1831 Chronic kidney disease, stage 3a: Secondary | ICD-10-CM

## 2020-03-08 LAB — BASIC METABOLIC PANEL WITH GFR
BUN/Creatinine Ratio: 26 (calc) — ABNORMAL HIGH (ref 6–22)
BUN: 48 mg/dL — ABNORMAL HIGH (ref 7–25)
CO2: 32 mmol/L (ref 20–32)
Calcium: 9.9 mg/dL (ref 8.6–10.4)
Chloride: 104 mmol/L (ref 98–110)
Creat: 1.83 mg/dL — ABNORMAL HIGH (ref 0.60–0.88)
GFR, Est African American: 27 mL/min/{1.73_m2} — ABNORMAL LOW (ref >=60)
GFR, Est Non African American: 23 mL/min/{1.73_m2} — ABNORMAL LOW (ref >=60)
Glucose, Bld: 100 mg/dL — ABNORMAL HIGH (ref 65–99)
Potassium: 5.4 mmol/L — ABNORMAL HIGH (ref 3.5–5.3)
Sodium: 144 mmol/L (ref 135–146)

## 2020-03-08 NOTE — Telephone Encounter (Signed)
Dated patient on her labs, kidney function has decreased slightly over the last 2 weeks.  Creatinine 1.83 GFR 23.  This is likely due to Lasix therapy.  She did receive a lot of IV Lasix at her recent hospital admission.  We will have her come back in 1 week and retest.

## 2020-03-09 DIAGNOSIS — I130 Hypertensive heart and chronic kidney disease with heart failure and stage 1 through stage 4 chronic kidney disease, or unspecified chronic kidney disease: Secondary | ICD-10-CM | POA: Diagnosis not present

## 2020-03-09 DIAGNOSIS — D631 Anemia in chronic kidney disease: Secondary | ICD-10-CM | POA: Diagnosis not present

## 2020-03-09 DIAGNOSIS — E1122 Type 2 diabetes mellitus with diabetic chronic kidney disease: Secondary | ICD-10-CM | POA: Diagnosis not present

## 2020-03-09 DIAGNOSIS — N1832 Chronic kidney disease, stage 3b: Secondary | ICD-10-CM | POA: Diagnosis not present

## 2020-03-09 DIAGNOSIS — I0981 Rheumatic heart failure: Secondary | ICD-10-CM | POA: Diagnosis not present

## 2020-03-09 DIAGNOSIS — I5033 Acute on chronic diastolic (congestive) heart failure: Secondary | ICD-10-CM | POA: Diagnosis not present

## 2020-03-10 DIAGNOSIS — I130 Hypertensive heart and chronic kidney disease with heart failure and stage 1 through stage 4 chronic kidney disease, or unspecified chronic kidney disease: Secondary | ICD-10-CM | POA: Diagnosis not present

## 2020-03-10 DIAGNOSIS — D631 Anemia in chronic kidney disease: Secondary | ICD-10-CM | POA: Diagnosis not present

## 2020-03-10 DIAGNOSIS — I0981 Rheumatic heart failure: Secondary | ICD-10-CM | POA: Diagnosis not present

## 2020-03-10 DIAGNOSIS — I5033 Acute on chronic diastolic (congestive) heart failure: Secondary | ICD-10-CM | POA: Diagnosis not present

## 2020-03-10 DIAGNOSIS — N1832 Chronic kidney disease, stage 3b: Secondary | ICD-10-CM | POA: Diagnosis not present

## 2020-03-10 DIAGNOSIS — E1122 Type 2 diabetes mellitus with diabetic chronic kidney disease: Secondary | ICD-10-CM | POA: Diagnosis not present

## 2020-03-13 DIAGNOSIS — I130 Hypertensive heart and chronic kidney disease with heart failure and stage 1 through stage 4 chronic kidney disease, or unspecified chronic kidney disease: Secondary | ICD-10-CM | POA: Diagnosis not present

## 2020-03-13 DIAGNOSIS — E1122 Type 2 diabetes mellitus with diabetic chronic kidney disease: Secondary | ICD-10-CM | POA: Diagnosis not present

## 2020-03-13 DIAGNOSIS — I5033 Acute on chronic diastolic (congestive) heart failure: Secondary | ICD-10-CM | POA: Diagnosis not present

## 2020-03-13 DIAGNOSIS — N1832 Chronic kidney disease, stage 3b: Secondary | ICD-10-CM | POA: Diagnosis not present

## 2020-03-13 DIAGNOSIS — I0981 Rheumatic heart failure: Secondary | ICD-10-CM | POA: Diagnosis not present

## 2020-03-13 DIAGNOSIS — D631 Anemia in chronic kidney disease: Secondary | ICD-10-CM | POA: Diagnosis not present

## 2020-03-14 ENCOUNTER — Other Ambulatory Visit (INDEPENDENT_AMBULATORY_CARE_PROVIDER_SITE_OTHER): Payer: Medicare Other

## 2020-03-14 ENCOUNTER — Other Ambulatory Visit: Payer: Self-pay

## 2020-03-14 DIAGNOSIS — I0981 Rheumatic heart failure: Secondary | ICD-10-CM | POA: Diagnosis not present

## 2020-03-14 DIAGNOSIS — D631 Anemia in chronic kidney disease: Secondary | ICD-10-CM | POA: Diagnosis not present

## 2020-03-14 DIAGNOSIS — N1832 Chronic kidney disease, stage 3b: Secondary | ICD-10-CM | POA: Diagnosis not present

## 2020-03-14 DIAGNOSIS — N1831 Chronic kidney disease, stage 3a: Secondary | ICD-10-CM | POA: Diagnosis not present

## 2020-03-14 DIAGNOSIS — I130 Hypertensive heart and chronic kidney disease with heart failure and stage 1 through stage 4 chronic kidney disease, or unspecified chronic kidney disease: Secondary | ICD-10-CM | POA: Diagnosis not present

## 2020-03-14 DIAGNOSIS — I5033 Acute on chronic diastolic (congestive) heart failure: Secondary | ICD-10-CM | POA: Diagnosis not present

## 2020-03-14 DIAGNOSIS — E1122 Type 2 diabetes mellitus with diabetic chronic kidney disease: Secondary | ICD-10-CM | POA: Diagnosis not present

## 2020-03-14 NOTE — Addendum Note (Signed)
Addended by: Marrion Coy on: 03/14/2020 10:31 AM   Modules accepted: Orders

## 2020-03-15 ENCOUNTER — Telehealth: Payer: Self-pay | Admitting: Adult Health

## 2020-03-15 DIAGNOSIS — E1122 Type 2 diabetes mellitus with diabetic chronic kidney disease: Secondary | ICD-10-CM | POA: Diagnosis not present

## 2020-03-15 DIAGNOSIS — I0981 Rheumatic heart failure: Secondary | ICD-10-CM | POA: Diagnosis not present

## 2020-03-15 DIAGNOSIS — I130 Hypertensive heart and chronic kidney disease with heart failure and stage 1 through stage 4 chronic kidney disease, or unspecified chronic kidney disease: Secondary | ICD-10-CM | POA: Diagnosis not present

## 2020-03-15 DIAGNOSIS — N184 Chronic kidney disease, stage 4 (severe): Secondary | ICD-10-CM

## 2020-03-15 DIAGNOSIS — I5033 Acute on chronic diastolic (congestive) heart failure: Secondary | ICD-10-CM | POA: Diagnosis not present

## 2020-03-15 DIAGNOSIS — D631 Anemia in chronic kidney disease: Secondary | ICD-10-CM | POA: Diagnosis not present

## 2020-03-15 DIAGNOSIS — N1832 Chronic kidney disease, stage 3b: Secondary | ICD-10-CM | POA: Diagnosis not present

## 2020-03-15 LAB — BASIC METABOLIC PANEL
BUN/Creatinine Ratio: 27 (calc) — ABNORMAL HIGH (ref 6–22)
BUN: 50 mg/dL — ABNORMAL HIGH (ref 7–25)
CO2: 27 mmol/L (ref 20–32)
Calcium: 9.3 mg/dL (ref 8.6–10.4)
Chloride: 103 mmol/L (ref 98–110)
Creat: 1.86 mg/dL — ABNORMAL HIGH (ref 0.60–0.88)
Glucose, Bld: 146 mg/dL — ABNORMAL HIGH (ref 65–99)
Potassium: 5.9 mmol/L — ABNORMAL HIGH (ref 3.5–5.3)
Sodium: 143 mmol/L (ref 135–146)

## 2020-03-15 NOTE — Telephone Encounter (Signed)
Spoke to patient and informed her of her recent labs.  Kidney function has stayed about the same although decreased past her baseline.  I reached out to her cardiologist Dr. Burt Knack we both agreed that we should trial the patient taking her Lasix every other day as long as she is weighing herself and her weight is staying stable we can continue that trend.  She is okay with doing this and we will have her follow-up in 2 weeks for repeat kidney function tests

## 2020-03-16 DIAGNOSIS — D631 Anemia in chronic kidney disease: Secondary | ICD-10-CM | POA: Diagnosis not present

## 2020-03-16 DIAGNOSIS — N1832 Chronic kidney disease, stage 3b: Secondary | ICD-10-CM | POA: Diagnosis not present

## 2020-03-16 DIAGNOSIS — E1122 Type 2 diabetes mellitus with diabetic chronic kidney disease: Secondary | ICD-10-CM | POA: Diagnosis not present

## 2020-03-16 DIAGNOSIS — I0981 Rheumatic heart failure: Secondary | ICD-10-CM | POA: Diagnosis not present

## 2020-03-16 DIAGNOSIS — I130 Hypertensive heart and chronic kidney disease with heart failure and stage 1 through stage 4 chronic kidney disease, or unspecified chronic kidney disease: Secondary | ICD-10-CM | POA: Diagnosis not present

## 2020-03-16 DIAGNOSIS — I5033 Acute on chronic diastolic (congestive) heart failure: Secondary | ICD-10-CM | POA: Diagnosis not present

## 2020-03-20 DIAGNOSIS — I5033 Acute on chronic diastolic (congestive) heart failure: Secondary | ICD-10-CM | POA: Diagnosis not present

## 2020-03-20 DIAGNOSIS — I0981 Rheumatic heart failure: Secondary | ICD-10-CM | POA: Diagnosis not present

## 2020-03-20 DIAGNOSIS — E1122 Type 2 diabetes mellitus with diabetic chronic kidney disease: Secondary | ICD-10-CM | POA: Diagnosis not present

## 2020-03-20 DIAGNOSIS — D631 Anemia in chronic kidney disease: Secondary | ICD-10-CM | POA: Diagnosis not present

## 2020-03-20 DIAGNOSIS — I130 Hypertensive heart and chronic kidney disease with heart failure and stage 1 through stage 4 chronic kidney disease, or unspecified chronic kidney disease: Secondary | ICD-10-CM | POA: Diagnosis not present

## 2020-03-20 DIAGNOSIS — N1832 Chronic kidney disease, stage 3b: Secondary | ICD-10-CM | POA: Diagnosis not present

## 2020-03-22 ENCOUNTER — Telehealth: Payer: Self-pay | Admitting: Adult Health

## 2020-03-22 DIAGNOSIS — I130 Hypertensive heart and chronic kidney disease with heart failure and stage 1 through stage 4 chronic kidney disease, or unspecified chronic kidney disease: Secondary | ICD-10-CM | POA: Diagnosis not present

## 2020-03-22 DIAGNOSIS — E1122 Type 2 diabetes mellitus with diabetic chronic kidney disease: Secondary | ICD-10-CM | POA: Diagnosis not present

## 2020-03-22 DIAGNOSIS — N1832 Chronic kidney disease, stage 3b: Secondary | ICD-10-CM | POA: Diagnosis not present

## 2020-03-22 DIAGNOSIS — I5033 Acute on chronic diastolic (congestive) heart failure: Secondary | ICD-10-CM | POA: Diagnosis not present

## 2020-03-22 DIAGNOSIS — D631 Anemia in chronic kidney disease: Secondary | ICD-10-CM | POA: Diagnosis not present

## 2020-03-22 DIAGNOSIS — I0981 Rheumatic heart failure: Secondary | ICD-10-CM | POA: Diagnosis not present

## 2020-03-22 NOTE — Telephone Encounter (Signed)
Sara Garza is calling to get verbal orders to recertify patient for physical therapy. Orders are 1 week 1, 2 week 3 and 1 week 3. Also he wanted to confirm dosage for lasix. Patient confirms that when she takes medication she still urinates at least 5 times throughout the night, please advise. CB is [phone]

## 2020-03-22 NOTE — Telephone Encounter (Signed)
OK 

## 2020-03-23 NOTE — Telephone Encounter (Signed)
Spoke with Clair Gulling to give the verbal orders as below.

## 2020-03-25 DIAGNOSIS — M1712 Unilateral primary osteoarthritis, left knee: Secondary | ICD-10-CM | POA: Diagnosis not present

## 2020-03-25 DIAGNOSIS — M810 Age-related osteoporosis without current pathological fracture: Secondary | ICD-10-CM | POA: Diagnosis not present

## 2020-03-25 DIAGNOSIS — I2510 Atherosclerotic heart disease of native coronary artery without angina pectoris: Secondary | ICD-10-CM | POA: Diagnosis not present

## 2020-03-25 DIAGNOSIS — I052 Rheumatic mitral stenosis with insufficiency: Secondary | ICD-10-CM | POA: Diagnosis not present

## 2020-03-25 DIAGNOSIS — I700 Atherosclerosis of aorta: Secondary | ICD-10-CM | POA: Diagnosis not present

## 2020-03-25 DIAGNOSIS — Z955 Presence of coronary angioplasty implant and graft: Secondary | ICD-10-CM | POA: Diagnosis not present

## 2020-03-25 DIAGNOSIS — I5033 Acute on chronic diastolic (congestive) heart failure: Secondary | ICD-10-CM | POA: Diagnosis not present

## 2020-03-25 DIAGNOSIS — E1122 Type 2 diabetes mellitus with diabetic chronic kidney disease: Secondary | ICD-10-CM | POA: Diagnosis not present

## 2020-03-25 DIAGNOSIS — I447 Left bundle-branch block, unspecified: Secondary | ICD-10-CM | POA: Diagnosis not present

## 2020-03-25 DIAGNOSIS — I6522 Occlusion and stenosis of left carotid artery: Secondary | ICD-10-CM | POA: Diagnosis not present

## 2020-03-25 DIAGNOSIS — E1142 Type 2 diabetes mellitus with diabetic polyneuropathy: Secondary | ICD-10-CM | POA: Diagnosis not present

## 2020-03-25 DIAGNOSIS — S72142D Displaced intertrochanteric fracture of left femur, subsequent encounter for closed fracture with routine healing: Secondary | ICD-10-CM | POA: Diagnosis not present

## 2020-03-25 DIAGNOSIS — E785 Hyperlipidemia, unspecified: Secondary | ICD-10-CM | POA: Diagnosis not present

## 2020-03-25 DIAGNOSIS — Z952 Presence of prosthetic heart valve: Secondary | ICD-10-CM | POA: Diagnosis not present

## 2020-03-25 DIAGNOSIS — D631 Anemia in chronic kidney disease: Secondary | ICD-10-CM | POA: Diagnosis not present

## 2020-03-25 DIAGNOSIS — M47814 Spondylosis without myelopathy or radiculopathy, thoracic region: Secondary | ICD-10-CM | POA: Diagnosis not present

## 2020-03-25 DIAGNOSIS — Z96641 Presence of right artificial hip joint: Secondary | ICD-10-CM | POA: Diagnosis not present

## 2020-03-25 DIAGNOSIS — D62 Acute posthemorrhagic anemia: Secondary | ICD-10-CM | POA: Diagnosis not present

## 2020-03-25 DIAGNOSIS — Z7982 Long term (current) use of aspirin: Secondary | ICD-10-CM | POA: Diagnosis not present

## 2020-03-25 DIAGNOSIS — J9601 Acute respiratory failure with hypoxia: Secondary | ICD-10-CM | POA: Diagnosis not present

## 2020-03-25 DIAGNOSIS — N1832 Chronic kidney disease, stage 3b: Secondary | ICD-10-CM | POA: Diagnosis not present

## 2020-03-25 DIAGNOSIS — J9811 Atelectasis: Secondary | ICD-10-CM | POA: Diagnosis not present

## 2020-03-25 DIAGNOSIS — I130 Hypertensive heart and chronic kidney disease with heart failure and stage 1 through stage 4 chronic kidney disease, or unspecified chronic kidney disease: Secondary | ICD-10-CM | POA: Diagnosis not present

## 2020-03-25 DIAGNOSIS — H409 Unspecified glaucoma: Secondary | ICD-10-CM | POA: Diagnosis not present

## 2020-03-25 DIAGNOSIS — I0981 Rheumatic heart failure: Secondary | ICD-10-CM | POA: Diagnosis not present

## 2020-03-27 DIAGNOSIS — I5033 Acute on chronic diastolic (congestive) heart failure: Secondary | ICD-10-CM | POA: Diagnosis not present

## 2020-03-27 DIAGNOSIS — I0981 Rheumatic heart failure: Secondary | ICD-10-CM | POA: Diagnosis not present

## 2020-03-27 DIAGNOSIS — S72142D Displaced intertrochanteric fracture of left femur, subsequent encounter for closed fracture with routine healing: Secondary | ICD-10-CM | POA: Diagnosis not present

## 2020-03-27 DIAGNOSIS — N1832 Chronic kidney disease, stage 3b: Secondary | ICD-10-CM | POA: Diagnosis not present

## 2020-03-27 DIAGNOSIS — I130 Hypertensive heart and chronic kidney disease with heart failure and stage 1 through stage 4 chronic kidney disease, or unspecified chronic kidney disease: Secondary | ICD-10-CM | POA: Diagnosis not present

## 2020-03-27 DIAGNOSIS — E1122 Type 2 diabetes mellitus with diabetic chronic kidney disease: Secondary | ICD-10-CM | POA: Diagnosis not present

## 2020-03-28 DIAGNOSIS — E1122 Type 2 diabetes mellitus with diabetic chronic kidney disease: Secondary | ICD-10-CM | POA: Diagnosis not present

## 2020-03-28 DIAGNOSIS — I0981 Rheumatic heart failure: Secondary | ICD-10-CM | POA: Diagnosis not present

## 2020-03-28 DIAGNOSIS — I130 Hypertensive heart and chronic kidney disease with heart failure and stage 1 through stage 4 chronic kidney disease, or unspecified chronic kidney disease: Secondary | ICD-10-CM | POA: Diagnosis not present

## 2020-03-28 DIAGNOSIS — I5033 Acute on chronic diastolic (congestive) heart failure: Secondary | ICD-10-CM | POA: Diagnosis not present

## 2020-03-28 DIAGNOSIS — S72142D Displaced intertrochanteric fracture of left femur, subsequent encounter for closed fracture with routine healing: Secondary | ICD-10-CM | POA: Diagnosis not present

## 2020-03-28 DIAGNOSIS — N1832 Chronic kidney disease, stage 3b: Secondary | ICD-10-CM | POA: Diagnosis not present

## 2020-04-02 ENCOUNTER — Other Ambulatory Visit: Payer: Self-pay | Admitting: Adult Health

## 2020-04-02 DIAGNOSIS — N3281 Overactive bladder: Secondary | ICD-10-CM

## 2020-04-04 ENCOUNTER — Telehealth: Payer: Self-pay | Admitting: Adult Health

## 2020-04-04 DIAGNOSIS — I130 Hypertensive heart and chronic kidney disease with heart failure and stage 1 through stage 4 chronic kidney disease, or unspecified chronic kidney disease: Secondary | ICD-10-CM | POA: Diagnosis not present

## 2020-04-04 DIAGNOSIS — I0981 Rheumatic heart failure: Secondary | ICD-10-CM | POA: Diagnosis not present

## 2020-04-04 DIAGNOSIS — N1832 Chronic kidney disease, stage 3b: Secondary | ICD-10-CM | POA: Diagnosis not present

## 2020-04-04 DIAGNOSIS — E1122 Type 2 diabetes mellitus with diabetic chronic kidney disease: Secondary | ICD-10-CM | POA: Diagnosis not present

## 2020-04-04 DIAGNOSIS — S72142D Displaced intertrochanteric fracture of left femur, subsequent encounter for closed fracture with routine healing: Secondary | ICD-10-CM | POA: Diagnosis not present

## 2020-04-04 DIAGNOSIS — I5033 Acute on chronic diastolic (congestive) heart failure: Secondary | ICD-10-CM | POA: Diagnosis not present

## 2020-04-04 NOTE — Addendum Note (Signed)
Addended by: Marrion Coy on: 04/04/2020 02:07 PM   Modules accepted: Orders

## 2020-04-04 NOTE — Telephone Encounter (Signed)
Per [name] home nurse-patient refused the social work visit stated she did not need it.  [facility] nurse  Janice Norrie wanted to inform Sara Garza any questions please call [phone]

## 2020-04-05 ENCOUNTER — Other Ambulatory Visit (INDEPENDENT_AMBULATORY_CARE_PROVIDER_SITE_OTHER): Payer: Medicare Other

## 2020-04-05 ENCOUNTER — Other Ambulatory Visit: Payer: Self-pay

## 2020-04-05 DIAGNOSIS — N184 Chronic kidney disease, stage 4 (severe): Secondary | ICD-10-CM | POA: Diagnosis not present

## 2020-04-05 LAB — COMPREHENSIVE METABOLIC PANEL
ALT: 12 U/L (ref 0–35)
AST: 18 U/L (ref 0–37)
Albumin: 3.9 g/dL (ref 3.5–5.2)
Alkaline Phosphatase: 105 U/L (ref 39–117)
BUN: 47 mg/dL — ABNORMAL HIGH (ref 6–23)
CO2: 31 mEq/L (ref 19–32)
Calcium: 9.4 mg/dL (ref 8.4–10.5)
Chloride: 104 mEq/L (ref 96–112)
Creatinine, Ser: 1.81 mg/dL — ABNORMAL HIGH (ref 0.40–1.20)
GFR: 23.61 mL/min — ABNORMAL LOW (ref >60.00)
Glucose, Bld: 115 mg/dL — ABNORMAL HIGH (ref 70–99)
Potassium: 5.1 mEq/L (ref 3.5–5.1)
Sodium: 141 mEq/L (ref 135–145)
Total Bilirubin: 0.4 mg/dL (ref 0.2–1.2)
Total Protein: 7 g/dL (ref 6.0–8.3)

## 2020-04-07 ENCOUNTER — Telehealth: Payer: Self-pay | Admitting: Adult Health

## 2020-04-07 DIAGNOSIS — S72142D Displaced intertrochanteric fracture of left femur, subsequent encounter for closed fracture with routine healing: Secondary | ICD-10-CM | POA: Diagnosis not present

## 2020-04-07 DIAGNOSIS — I5033 Acute on chronic diastolic (congestive) heart failure: Secondary | ICD-10-CM | POA: Diagnosis not present

## 2020-04-07 DIAGNOSIS — I130 Hypertensive heart and chronic kidney disease with heart failure and stage 1 through stage 4 chronic kidney disease, or unspecified chronic kidney disease: Secondary | ICD-10-CM | POA: Diagnosis not present

## 2020-04-07 DIAGNOSIS — E1122 Type 2 diabetes mellitus with diabetic chronic kidney disease: Secondary | ICD-10-CM | POA: Diagnosis not present

## 2020-04-07 DIAGNOSIS — N1832 Chronic kidney disease, stage 3b: Secondary | ICD-10-CM | POA: Diagnosis not present

## 2020-04-07 DIAGNOSIS — I0981 Rheumatic heart failure: Secondary | ICD-10-CM | POA: Diagnosis not present

## 2020-04-07 NOTE — Telephone Encounter (Signed)
Updated patient on her labs.  Kidney function has stayed stable with the change in Lasix to every other day dosing.  She does report that her edema has stayed stable as well as her weights.  We will keep well enough alone and continue her on this dosing.  She does have a follow-up appointment with cardiology in 2 weeks.

## 2020-04-10 NOTE — Telephone Encounter (Signed)
Pt is calling to get her lab results

## 2020-04-13 NOTE — Telephone Encounter (Signed)
LMOM for a return call.  

## 2020-04-13 NOTE — Telephone Encounter (Signed)
Pt is calling in to see if she can get the last two times that she was in here for her lab results.  Pt would like to please have a call back with this information.

## 2020-04-14 ENCOUNTER — Telehealth: Payer: Self-pay | Admitting: Adult Health

## 2020-04-14 DIAGNOSIS — S72145D Nondisplaced intertrochanteric fracture of left femur, subsequent encounter for closed fracture with routine healing: Secondary | ICD-10-CM | POA: Diagnosis not present

## 2020-04-14 NOTE — Telephone Encounter (Signed)
Spoke to the pt and she was needing her last set of labs and the previous.  I have mailed them to her as requested.  Nothing further needed.

## 2020-04-14 NOTE — Telephone Encounter (Signed)
Pt is returning the call to the office 

## 2020-04-20 ENCOUNTER — Telehealth: Payer: Self-pay | Admitting: Adult Health

## 2020-04-20 DIAGNOSIS — N1832 Chronic kidney disease, stage 3b: Secondary | ICD-10-CM | POA: Diagnosis not present

## 2020-04-20 DIAGNOSIS — S72142D Displaced intertrochanteric fracture of left femur, subsequent encounter for closed fracture with routine healing: Secondary | ICD-10-CM | POA: Diagnosis not present

## 2020-04-20 DIAGNOSIS — I0981 Rheumatic heart failure: Secondary | ICD-10-CM | POA: Diagnosis not present

## 2020-04-20 DIAGNOSIS — E1122 Type 2 diabetes mellitus with diabetic chronic kidney disease: Secondary | ICD-10-CM | POA: Diagnosis not present

## 2020-04-20 DIAGNOSIS — I130 Hypertensive heart and chronic kidney disease with heart failure and stage 1 through stage 4 chronic kidney disease, or unspecified chronic kidney disease: Secondary | ICD-10-CM | POA: Diagnosis not present

## 2020-04-20 DIAGNOSIS — I5033 Acute on chronic diastolic (congestive) heart failure: Secondary | ICD-10-CM | POA: Diagnosis not present

## 2020-04-20 NOTE — Telephone Encounter (Signed)
Herbert Deaner is calling and wanted to let provider know that patient reports to have left lower extremity pain 7 out of 10. Pt saw surgeon last week and stated that is was arthritis and told her to contact PCP for pain control. Currently patient is taking tynelol only. Physical therapist wanted to see if there was oral or topical she can take to help with pain, please advise. CB is [phone]

## 2020-04-21 ENCOUNTER — Ambulatory Visit (INDEPENDENT_AMBULATORY_CARE_PROVIDER_SITE_OTHER): Payer: Medicare Other | Admitting: Cardiovascular Disease

## 2020-04-21 ENCOUNTER — Other Ambulatory Visit: Payer: Self-pay

## 2020-04-21 ENCOUNTER — Encounter: Payer: Self-pay | Admitting: Cardiovascular Disease

## 2020-04-21 VITALS — BP 128/70 | HR 79 | Ht 59.0 in | Wt 147.0 lb

## 2020-04-21 DIAGNOSIS — N184 Chronic kidney disease, stage 4 (severe): Secondary | ICD-10-CM

## 2020-04-21 DIAGNOSIS — I25119 Atherosclerotic heart disease of native coronary artery with unspecified angina pectoris: Secondary | ICD-10-CM | POA: Diagnosis not present

## 2020-04-21 DIAGNOSIS — I5032 Chronic diastolic (congestive) heart failure: Secondary | ICD-10-CM | POA: Diagnosis not present

## 2020-04-21 DIAGNOSIS — I359 Nonrheumatic aortic valve disorder, unspecified: Secondary | ICD-10-CM

## 2020-04-21 MED ORDER — DICLOFENAC SODIUM 1 % EX GEL: 4.0000 g | Freq: Four times a day (QID) | CUTANEOUS | 2 refills | Status: DC

## 2020-04-21 NOTE — Patient Instructions (Signed)
Medication Instructions:  Your provider recommends that you continue on your current medications as directed. Please refer to the Current Medication list given to you today.   *If you need a refill on your cardiac medications before your next appointment, please call your pharmacy*   Follow-Up: Your provider recommends that you schedule a follow-up appointment in 3 months.

## 2020-04-21 NOTE — Telephone Encounter (Signed)
It can work for post surgical pain, only way to find out is to try it for a short course

## 2020-04-21 NOTE — Telephone Encounter (Signed)
Tried reaching [employer]  Left a message for a return call.  Called the pt and spoke to her.  She stated her left hip is hurting right where she had hip surgery.  The pain travels to her groin and spreads a little down her thigh on the outside.  Will check with [name] to make sure Voltaren Gel is appropriate for this kind of pain.

## 2020-04-21 NOTE — Telephone Encounter (Signed)
We can try topical Votaren Gel. This is an NSAID but only a very small amount gets in the blood stream so this should be ok with her kidney function

## 2020-04-21 NOTE — Telephone Encounter (Signed)
Pt notified that rx has been sent to the pharmacy.  Nothing further needed. 

## 2020-04-21 NOTE — Progress Notes (Signed)
Cardiology Office Note:    Date:  04/21/2020   ID:  Sara Garza, DOB 1924-01-23, MRN [id number]  PCP:  [name], NP  [facility] Cardiologist:  Sherren Mocha, MD  [place] Electrophysiologist:  None   Referring MD: [name], NP   Chief Complaint  Patient presents with  . Shortness of Breath    History of Present Illness:    Sara Garza is a 87 y.o. female with a hx of coronary artery disease, aortic stenosis, and chronic diastolic heart failure, presenting for hospital follow-up evaluation.  She underwent for [name] for treatment of severe symptomatic aortic stenosis in 2014. She was treated with a 23 mm Medtronic Corevalve through the moderate risk SurTAVI aortic stenosis trial. She also is followed for moderate coronary artery disease which was evaluated with pressure wire analysis of the RCA and LCx demonstrating no hemodynamic evidence of flow-obstruction. 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  The patient has struggled with intermittent heart failure over the past few years.  She was hospitalized last year with a hip fracture that required surgery.  She developed congestive heart failure and volume overload postoperatively and was hospitalized in November 2021 with decompensated heart failure requiring treatment with IV diuretics.  Her creatinine increased over time and we have had to reduce her furosemide to every other day dosing.  The patient is here with her daughter today for follow-up evaluation.  I saw her last March 01, 2020.  She reports no significant change in symptoms.  Her shortness of breath with activity is stable.  She has not had recent problems with orthopnea or PND.  She reports no change in her chronic leg swelling.  She has had no recent chest pain or pressure.  Past Medical History:  Diagnosis Date  . Arthritis   . CAD (coronary  artery disease)   . Carotid stenosis, left    50-60% stable  . Diabetes mellitus   . Glaucoma   . Hyperlipidemia   . Hypertension   . Leg pain    ABIs 2/18: normal bilaterally.  . Osteoporosis   . Valvular heart disease    Aortic Stenosis s/p [name] 2014 // Mitral stenosis // Echo 09/2018: EF > 65, mod LVH, severe focal basal hypertrophy, RVSP 39.8, mild to mod MR, mod to severe MS (mean 9), AVR with mild AI, severe LAE (similar to prior echo)    Past Surgical History:  Procedure Laterality Date  . ANOMALOUS PULMONARY VENOUS RETURN REPAIR, TOTAL    . APPENDECTOMY    . CARDIAC VALVE REPLACEMENT  04/29/2012  . Cataracts Bilateral   . CESAREAN SECTION    . FOOT SURGERY     [name]  . HIP ARTHROPLASTY Right 07/31/2013   Procedure: ARTHROPLASTY BIPOLAR HIP;  Surgeon: [name], MD;  Location: [facility];  Service: Orthopedics;  Laterality: Right;  . INTRAMEDULLARY (IM) NAIL INTERTROCHANTERIC Left 01/01/2020   Procedure: INTRAMEDULLARY (IM) NAIL INTERTROCHANTRIC;  Surgeon: Rod Can, MD;  Location: [facility];  Service: Orthopedics;  Laterality: Left;  . PERCUTANEOUS CORONARY STENT INTERVENTION (PCI-S) N/A 01/02/2012   Procedure: PERCUTANEOUS CORONARY STENT INTERVENTION (PCI-S);  Surgeon: Sherren Mocha, MD;  Location: [facility] CATH LAB;  Service: Cardiovascular;  Laterality: N/A;    Current Medications: Current Meds  Medication Sig  . acetaminophen (TYLENOL) 500 MG tablet Take 2 tablets (1,000 mg total) by mouth 4 (four)  times daily.  [name] amLODipine (NORVASC) 5 MG tablet Take 1 tablet (5 mg total) by mouth daily.  [name] aspirin EC 81 MG tablet Take 81 mg by mouth daily. Swallow whole.  . bimatoprost (LUMIGAN) 0.01 % SOLN Place 1 drop into both eyes at bedtime.   . Calcium Carb-Cholecalciferol 500-400 MG-UNIT TABS Take 1 tablet by mouth daily.  . carvedilol (COREG) 3.125 MG tablet Take 1 tablet (3.125 mg total) by mouth 2 (two) times daily.  . diclofenac Sodium (VOLTAREN) 1 % GEL Apply 4 g  topically 4 (four) times daily.  [name] ezetimibe (ZETIA) 10 MG tablet TAKE 1 TABLET(10 MG) BY MOUTH DAILY  . furosemide (LASIX) 40 MG tablet Take 40 mg by mouth every other day.  . Multiple Vitamin (MULTIVITAMIN) tablet Take 1 tablet by mouth daily.  . Multiple Vitamins-Minerals (PRESERVISION AREDS 2+MULTI VIT PO) Take 2 capsules by mouth in the morning and at bedtime.     Allergies:   Statins and Gabapentin   Social History   Socioeconomic History  . Marital status: Widowed    Spouse name: Not on file  . Number of children: Not on file  . Years of education: Not on file  . Highest education level: Not on file  Occupational History  . Not on file  Tobacco Use  . Smoking status: Passive Smoke Exposure - Never Smoker  . Smokeless tobacco: Never Used  Substance and Sexual Activity  . Alcohol use: Yes    Alcohol/week: 0.0 standard drinks    Comment: very seldom  . Drug use: No  . Sexual activity: Not Currently  Other Topics Concern  . Not on file  Social History Narrative   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      She likes to read and she takes classes at [employer] Strain: High Risk  . Difficulty of Paying Living Expenses: Hard  Food Insecurity: Not on file  Transportation Needs: No Transportation Needs  . Lack of Transportation (Medical): No  . Lack of Transportation (Non-Medical): No  Physical Activity: Not on file  Stress: Not on file  Social Connections: Not on file     Family History: The patient's family history includes COPD in her father; Cancer in her father; Lung cancer in an other family member.  ROS:   Please see the history of present illness.    All other systems reviewed and are negative.  EKGs/Labs/Other Studies Reviewed:    The following studies were reviewed today: 2D echocardiogram 10/14/2019: IMPRESSIONS    1. Aneursym of the mid-intraventricular  septum, neck measures 1.5 cm,  contained. Left ventricular ejection fraction, by estimation, is 65 to  70%. The left ventricle has normal function. The left ventricle has no  regional wall motion abnormalities. There  is severe asymmetric left ventricular hypertrophy of the basal-septal  segment. Left ventricular diastolic parameters are consistent with Grade I  diastolic dysfunction (impaired relaxation). Elevated left ventricular  end-diastolic pressure.  2. Right ventricular systolic function is hyperdynamic. The right  ventricular size is normal. There is normal pulmonary artery systolic  pressure.  3. Left atrial size was severely dilated.  4. The mitral valve is abnormal. Mild mitral valve regurgitation. Mild to  moderate mitral stenosis. The mean mitral valve gradient is 7.5 mmHg with  average heart rate of 70 bpm.  5. The aortic valve has been repaired/replaced.  Aortic valve  regurgitation is not visualized. There is a 23 mm Medtronic  CoreValve-Evolut Pro prosthetic ([name]) valve present in the aortic  position. Aortic valve area, by VTI measures 1.21 cm. Aortic  valve mean gradient measures 13.7 mmHg. Aortic valve Vmax measures 2.59  m/s. Peak gradient 27 mmHg.  6. The inferior vena cava is normal in size with greater than 50%  respiratory variability, suggesting right atrial pressure of 3 mmHg.  7. Evidence of atrial level shunting detected by color flow Doppler.  There is a small patent foramen ovale with predominantly left to right  shunting across the atrial septum.   Comparison(s): No significant change from prior study. Changes from prior  study are noted. 10/05/2018: LVEF >65%, severe focal basal septal  hypertrophy, moderaet to severe MS, 23 mm medtronic corevalve, mean  gradient 10 mmHg, peak gradient 24 mmHg.   EKG:  EKG is ordered today.  The ekg ordered today demonstrates normal sinus rhythm with left bundle branch block  Recent Labs: 12/01/2019: TSH  1.60 02/14/2020: B Natriuretic Peptide 592.1 02/16/2020: Magnesium 2.3 02/17/2020: Hemoglobin 10.3; Platelets PLATELET CLUMPS NOTED ON SMEAR, UNABLE TO ESTIMATE 04/05/2020: ALT 12; BUN 47; Creatinine, Ser 1.81; Potassium 5.1; Sodium 141  Recent Lipid Panel    Component Value Date/Time   CHOL 203 (H) 12/01/2019 1014   TRIG 149 12/01/2019 1014   HDL 42 (L) 12/01/2019 1014   CHOLHDL 4.8 12/01/2019 1014   VLDL 41.6 (H) 11/26/2018 1201   LDLCALC 133 (H) 12/01/2019 1014   LDLDIRECT 143.0 11/26/2018 1201     Risk Assessment/Calculations:       Physical Exam:    VS:  BP 128/70   Pulse 79   Ht 4\' 11"  (1.499 m)   Wt 147 lb (66.7 kg)   SpO2 97%   BMI 29.69 kg/m     Wt Readings from Last 3 Encounters:  04/21/20 147 lb (66.7 kg)  03/07/20 150 lb (68 kg)  03/01/20 150 lb (68 kg)     GEN: Well nourished, well developed pleasant elderly woman in no acute distress HEENT: Normal NECK: No JVD; No carotid bruits LYMPHATICS: No lymphadenopathy CARDIAC: RRR, 2/6 ejection murmur at the right upper sternal border RESPIRATORY: Diminished breath sounds in the bases without rales, wheezing or rhonchi  ABDOMEN: Soft, non-tender, non-distended MUSCULOSKELETAL: 1+ bilateral pretibial edema; No deformity  SKIN: Warm and dry NEUROLOGIC:  Alert and oriented x 3 PSYCHIATRIC:  Normal affect   ASSESSMENT:    1. Chronic diastolic CHF (congestive heart failure) ([place])   2. Coronary artery disease involving native coronary artery of native heart with angina pectoris ([place])   3. Aortic valve disease   4. Chronic kidney disease, stage IV (severe) ([facility])    PLAN:    In order of problems listed above:  1. Overall appears stable.  We had a discussion today about the balance of renal function and volume in the setting of diastolic heart failure.  Her creatinine has settled around 1.8 and her volume status also appears stable.  I think we should continue her current diuretic dose.  We discussed the  importance of avoiding nephrotoxic agents such as nonsteroidal anti-inflammatory drugs.  Her ARB has been discontinued.  No changes are made today.  I will see her back in 3 months for follow-up with lab work prior to her visit. 2. Stable symptoms, overall doing okay at this point. 3. Normal function of her aortic bioprosthesis has been noted on most recent echo study from  2021. 4. GFR now in the range of 15-30, consistent with stage IV chronic kidney disease.  We will have to keep a close eye on this but she appears to have stabilized over the past few months.        Medication Adjustments/Labs and Tests Ordered: Current medicines are reviewed at length with the patient today.  Concerns regarding medicines are outlined above.  Orders Placed This Encounter  Procedures  . EKG 12-Lead   No orders of the defined types were placed in this encounter.   Patient Instructions  Medication Instructions:  Your provider recommends that you continue on your current medications as directed. Please refer to the Current Medication list given to you today.   *If you need a refill on your cardiac medications before your next appointment, please call your pharmacy*   Follow-Up: Your provider recommends that you schedule a follow-up appointment in 3 months.    Signed, Sherren Mocha, MD  04/21/2020 5:42 PM    [facility]

## 2020-04-24 DIAGNOSIS — M1712 Unilateral primary osteoarthritis, left knee: Secondary | ICD-10-CM | POA: Diagnosis not present

## 2020-04-24 DIAGNOSIS — I700 Atherosclerosis of aorta: Secondary | ICD-10-CM | POA: Diagnosis not present

## 2020-04-24 DIAGNOSIS — Z7982 Long term (current) use of aspirin: Secondary | ICD-10-CM | POA: Diagnosis not present

## 2020-04-24 DIAGNOSIS — I130 Hypertensive heart and chronic kidney disease with heart failure and stage 1 through stage 4 chronic kidney disease, or unspecified chronic kidney disease: Secondary | ICD-10-CM | POA: Diagnosis not present

## 2020-04-24 DIAGNOSIS — M47814 Spondylosis without myelopathy or radiculopathy, thoracic region: Secondary | ICD-10-CM | POA: Diagnosis not present

## 2020-04-24 DIAGNOSIS — I052 Rheumatic mitral stenosis with insufficiency: Secondary | ICD-10-CM | POA: Diagnosis not present

## 2020-04-24 DIAGNOSIS — Z952 Presence of prosthetic heart valve: Secondary | ICD-10-CM | POA: Diagnosis not present

## 2020-04-24 DIAGNOSIS — M810 Age-related osteoporosis without current pathological fracture: Secondary | ICD-10-CM | POA: Diagnosis not present

## 2020-04-24 DIAGNOSIS — N1832 Chronic kidney disease, stage 3b: Secondary | ICD-10-CM | POA: Diagnosis not present

## 2020-04-24 DIAGNOSIS — I5033 Acute on chronic diastolic (congestive) heart failure: Secondary | ICD-10-CM | POA: Diagnosis not present

## 2020-04-24 DIAGNOSIS — I447 Left bundle-branch block, unspecified: Secondary | ICD-10-CM | POA: Diagnosis not present

## 2020-04-24 DIAGNOSIS — I6522 Occlusion and stenosis of left carotid artery: Secondary | ICD-10-CM | POA: Diagnosis not present

## 2020-04-24 DIAGNOSIS — D62 Acute posthemorrhagic anemia: Secondary | ICD-10-CM | POA: Diagnosis not present

## 2020-04-24 DIAGNOSIS — I0981 Rheumatic heart failure: Secondary | ICD-10-CM | POA: Diagnosis not present

## 2020-04-24 DIAGNOSIS — Z955 Presence of coronary angioplasty implant and graft: Secondary | ICD-10-CM | POA: Diagnosis not present

## 2020-04-24 DIAGNOSIS — J9811 Atelectasis: Secondary | ICD-10-CM | POA: Diagnosis not present

## 2020-04-24 DIAGNOSIS — J9601 Acute respiratory failure with hypoxia: Secondary | ICD-10-CM | POA: Diagnosis not present

## 2020-04-24 DIAGNOSIS — E785 Hyperlipidemia, unspecified: Secondary | ICD-10-CM | POA: Diagnosis not present

## 2020-04-24 DIAGNOSIS — S72142D Displaced intertrochanteric fracture of left femur, subsequent encounter for closed fracture with routine healing: Secondary | ICD-10-CM | POA: Diagnosis not present

## 2020-04-24 DIAGNOSIS — E1142 Type 2 diabetes mellitus with diabetic polyneuropathy: Secondary | ICD-10-CM | POA: Diagnosis not present

## 2020-04-24 DIAGNOSIS — Z96641 Presence of right artificial hip joint: Secondary | ICD-10-CM | POA: Diagnosis not present

## 2020-04-24 DIAGNOSIS — H409 Unspecified glaucoma: Secondary | ICD-10-CM | POA: Diagnosis not present

## 2020-04-24 DIAGNOSIS — D631 Anemia in chronic kidney disease: Secondary | ICD-10-CM | POA: Diagnosis not present

## 2020-04-24 DIAGNOSIS — I2510 Atherosclerotic heart disease of native coronary artery without angina pectoris: Secondary | ICD-10-CM | POA: Diagnosis not present

## 2020-04-24 DIAGNOSIS — E1122 Type 2 diabetes mellitus with diabetic chronic kidney disease: Secondary | ICD-10-CM | POA: Diagnosis not present

## 2020-04-25 ENCOUNTER — Other Ambulatory Visit: Payer: Self-pay | Admitting: Physician Assistant

## 2020-04-25 DIAGNOSIS — E1122 Type 2 diabetes mellitus with diabetic chronic kidney disease: Secondary | ICD-10-CM | POA: Diagnosis not present

## 2020-04-25 DIAGNOSIS — I130 Hypertensive heart and chronic kidney disease with heart failure and stage 1 through stage 4 chronic kidney disease, or unspecified chronic kidney disease: Secondary | ICD-10-CM | POA: Diagnosis not present

## 2020-04-25 DIAGNOSIS — I5033 Acute on chronic diastolic (congestive) heart failure: Secondary | ICD-10-CM | POA: Diagnosis not present

## 2020-04-25 DIAGNOSIS — N1832 Chronic kidney disease, stage 3b: Secondary | ICD-10-CM | POA: Diagnosis not present

## 2020-04-25 DIAGNOSIS — I25119 Atherosclerotic heart disease of native coronary artery with unspecified angina pectoris: Secondary | ICD-10-CM

## 2020-04-25 DIAGNOSIS — I10 Essential (primary) hypertension: Secondary | ICD-10-CM

## 2020-04-25 DIAGNOSIS — I0981 Rheumatic heart failure: Secondary | ICD-10-CM | POA: Diagnosis not present

## 2020-04-25 DIAGNOSIS — S72142D Displaced intertrochanteric fracture of left femur, subsequent encounter for closed fracture with routine healing: Secondary | ICD-10-CM | POA: Diagnosis not present

## 2020-04-28 ENCOUNTER — Telehealth: Payer: Self-pay | Admitting: Adult Health

## 2020-04-28 NOTE — Telephone Encounter (Signed)
Spoke with patient she will call back to  schedule Medicare Annual Wellness Visit (AWV) either virtually or in office.   Last AWV 12/08/17  please schedule at anytime with [facility] Nurse Health Advisor 1 or 2   This should be a 45 minute visit.

## 2020-05-02 ENCOUNTER — Telehealth: Payer: Self-pay | Admitting: Adult Health

## 2020-05-02 ENCOUNTER — Telehealth: Payer: Self-pay | Admitting: Cardiovascular Disease

## 2020-05-02 DIAGNOSIS — E1122 Type 2 diabetes mellitus with diabetic chronic kidney disease: Secondary | ICD-10-CM | POA: Diagnosis not present

## 2020-05-02 DIAGNOSIS — N1832 Chronic kidney disease, stage 3b: Secondary | ICD-10-CM | POA: Diagnosis not present

## 2020-05-02 DIAGNOSIS — I0981 Rheumatic heart failure: Secondary | ICD-10-CM | POA: Diagnosis not present

## 2020-05-02 DIAGNOSIS — I130 Hypertensive heart and chronic kidney disease with heart failure and stage 1 through stage 4 chronic kidney disease, or unspecified chronic kidney disease: Secondary | ICD-10-CM | POA: Diagnosis not present

## 2020-05-02 DIAGNOSIS — S72142D Displaced intertrochanteric fracture of left femur, subsequent encounter for closed fracture with routine healing: Secondary | ICD-10-CM | POA: Diagnosis not present

## 2020-05-02 DIAGNOSIS — I5033 Acute on chronic diastolic (congestive) heart failure: Secondary | ICD-10-CM | POA: Diagnosis not present

## 2020-05-02 NOTE — Telephone Encounter (Signed)
Pt c/o medication issue:  1. Name of Medication: furosemide (LASIX) 40 MG tablet  2. How are you currently taking this medication (dosage and times per day)? Patient is taking 40 MG by mouth every other day 3. Are you having a reaction (difficulty breathing--STAT)? N/a  4. What is your medication issue? Jeneen Rinks, physical therapist with [facility], is requesting to clarify patient's dosage instructions for her Lasix. He states he has instructions stating that the patient should be taking 40 MG daily, but the patient made him aware that she was advised to take 40 MG every other day. If this is correct, Jeneen Rinks would like to know when this change went into effect.   Please return call to Jeneen Rinks at [phone] to discuss and leave a detailed message if he is unavailable.

## 2020-05-02 NOTE — Telephone Encounter (Signed)
Herbert Deaner - PT [name] [phone]  Clair Gulling needs clarification on the directions for  furosemide (LASIX) 40 MG tablet  The directions says once daily and the patient told him that it's supposed to be once every other day.  Please advise

## 2020-05-02 NOTE — Telephone Encounter (Signed)
Left a message on Sara Garza's confidential voicemail.  Informed him the furosemide is maintained by cardiology and he should contact Dr. Antionette Char office.  Advised a call back if needed.  Nothing further  Needed at this time.

## 2020-05-03 NOTE — Telephone Encounter (Signed)
Left message on Jim's confidential VM that the patient reported she was taking lasix qod at last visit and Dr. Burt Knack agreed she could stay on that dose since she was euvolemic. Instructed him to call with questions or concerns.

## 2020-05-09 DIAGNOSIS — N1832 Chronic kidney disease, stage 3b: Secondary | ICD-10-CM | POA: Diagnosis not present

## 2020-05-09 DIAGNOSIS — I5033 Acute on chronic diastolic (congestive) heart failure: Secondary | ICD-10-CM | POA: Diagnosis not present

## 2020-05-09 DIAGNOSIS — S72142D Displaced intertrochanteric fracture of left femur, subsequent encounter for closed fracture with routine healing: Secondary | ICD-10-CM | POA: Diagnosis not present

## 2020-05-09 DIAGNOSIS — I0981 Rheumatic heart failure: Secondary | ICD-10-CM | POA: Diagnosis not present

## 2020-05-09 DIAGNOSIS — E1122 Type 2 diabetes mellitus with diabetic chronic kidney disease: Secondary | ICD-10-CM | POA: Diagnosis not present

## 2020-05-09 DIAGNOSIS — I130 Hypertensive heart and chronic kidney disease with heart failure and stage 1 through stage 4 chronic kidney disease, or unspecified chronic kidney disease: Secondary | ICD-10-CM | POA: Diagnosis not present

## 2020-05-10 ENCOUNTER — Ambulatory Visit: Payer: Medicare Other

## 2020-05-10 NOTE — Progress Notes (Deleted)
Chronic Care Management Pharmacy Note  05/10/2020 Name:  Sara Garza MRN:  [id number] DOB:  1923/08/07  Subjective: Sara Garza is an 87 y.o. year old female who is a primary patient of [name], NP.  The CCM team was consulted for assistance with disease management and care coordination needs.    Engaged with patient face to face for follow up visit in response to provider referral for pharmacy case management and/or care coordination services.   Consent to Services:  The patient was given information about Chronic Care Management services, agreed to services, and gave verbal consent prior to initiation of services.  Please see initial visit note for detailed documentation.   Patient Care Team: [name], NP as PCP - General (Family Medicine) Sherren Mocha, MD as PCP - Cardiology (Cardiology) Sherren Mocha, MD (Cardiology) Suella Broad, MD as Consulting Physician (Physical Medicine and Rehabilitation) Irine Seal, MD as Attending Physician (Urology) Germaine Pomfret, [facility] as Pharmacist (Pharmacist)  Recent office visits: 03/07/20 [name], NP: Patient presented for hospitalization follow up.   02/03/20 [name], NP: Patient presented for left hip fracture follow up.  12/01/19 [name], NP: Patient presented for chronic conditions follow up. Prescribed ditropan XL 5 mg daily.  Recent consult visits: 04/21/20 Sherren Mocha, MD (cardiology): Patient presented for CHF follow up. Continued furosemide every other day.  04/14/20 Cherlynn June ([facility]): Unable to access notes.  03/01/20 Sherren Mocha, MD (cardiology): Patient presented for CHF follow up. Prescribed carvedilol 3.125 mg BID and decreased amlodipine to 5 mg daily.  02/21/20 Rod Can (ortho): Unable to access notes.   Hospital visits: 02/14/20 - 02/17/20 Patient admitted for CHF exacerbation.  12/30/19-01/04/20 Patient admitted for femur fracture.  Objective:  Lab  Results  Component Value Date   CREATININE 1.81 (H) 04/05/2020   BUN 47 (H) 04/05/2020   GFR 23.61 (L) 04/05/2020   GFRNONAA 23 (L) 03/07/2020   GFRAA 27 (L) 03/07/2020   NA 141 04/05/2020   K 5.1 04/05/2020   CALCIUM 9.4 04/05/2020   CO2 31 04/05/2020    Lab Results  Component Value Date/Time   HGBA1C 5.7 (H) 12/01/2019 10:14 AM   HGBA1C 6.2 11/26/2018 12:01 PM   GFR 23.61 (L) 04/05/2020 01:10 PM   GFR 36.25 (L) 11/26/2018 12:01 PM   MICROALBUR 3.1 (H) 06/18/2016 09:45 AM    Last diabetic Eye exam:  Lab Results  Component Value Date/Time   HMDIABEYEEXA No Retinopathy 05/13/2019 12:00 AM    Last diabetic Foot exam:  Lab Results  Component Value Date/Time   HMDIABFOOTEX done 04/16/2010 12:00 AM     Lab Results  Component Value Date   CHOL 203 (H) 12/01/2019   HDL 42 (L) 12/01/2019   LDLCALC 133 (H) 12/01/2019   LDLDIRECT 143.0 11/26/2018   TRIG 149 12/01/2019   CHOLHDL 4.8 12/01/2019    Hepatic Function Latest Ref Rng & Units 04/05/2020 02/14/2020 12/01/2019  Total Protein 6.0 - 8.3 g/dL 7.0 7.8 6.8  Albumin 3.5 - 5.2 g/dL 3.9 3.6 -  AST 0 - 37 U/L _0 ALT 0 - 35 U/L _1 Alk Phosphatase 39 - 117 U/L 105 88 -  Total Bilirubin 0.2 - 1.2 mg/dL 0.4 0.8 0.6  Bilirubin, Direct 0.0 - 0.3 mg/dL - - -    Lab Results  Component Value Date/Time   TSH 1.60 12/01/2019 10:14 AM   TSH 1.63 11/26/2018 12:01 PM    CBC Latest Ref Rng &  Units 02/17/2020 02/16/2020 02/15/2020  WBC 4.0 - 10.5 K/uL 4.6 5.9 6.4  Hemoglobin 12.0 - 15.0 g/dL 10.3(L) 10.3(L) 10.0(L)  Hematocrit 36.0 - 46.0 % 31.1(L) 32.6(L) 32.3(L)  Platelets 150 - 400 K/uL PLATELET CLUMPS NOTED ON SMEAR, UNABLE TO ESTIMATE 239 229    Lab Results  Component Value Date/Time   VD25OH 37.79 04/29/2017 05:23 PM   VD25OH 38 07/31/2013 10:10 AM   VD25OH 41 02/22/2013 03:00 PM    Clinical ASCVD: {YES/NO:21197} The ASCVD Risk score Mikey Bussing DC Jr., et al., 2013) failed to calculate for the following  reasons:   The 2013 ASCVD risk score is only valid for ages 79 to 1    Depression screen PHQ 2/9 12/01/2019 12/08/2017 08/27/2017  Decreased Interest 0 0 0  Down, Depressed, Hopeless 0 0 0  PHQ - 2 Score 0 0 0  Some recent data might be hidden     ***Other: (CHADS2VASc if Afib, MMRC or CAT for COPD, ACT, DEXA)  Social History   Tobacco Use  Smoking Status Passive Smoke Exposure - Never Smoker  Smokeless Tobacco Never Used   BP Readings from Last 3 Encounters:  04/21/20 128/70  03/07/20 130/60  03/01/20 138/74   Pulse Readings from Last 3 Encounters:  04/21/20 79  03/07/20 78  03/01/20 100   Wt Readings from Last 3 Encounters:  04/21/20 147 lb (66.7 kg)  03/07/20 150 lb (68 kg)  03/01/20 150 lb (68 kg)    Assessment/Interventions: Review of patient past medical history, allergies, medications, health status, including review of consultants reports, laboratory and other test data, was performed as part of comprehensive evaluation and provision of chronic care management services.   SDOH:  (Social Determinants of Health) assessments and interventions performed: {yes/no:20286}   CCM Care Plan  Allergies  Allergen Reactions  . Statins Other (See Comments)    Muscle aches  . Gabapentin Other (See Comments)    SOMNOLENCE    Medications Reviewed Today    Reviewed by Sherren Mocha, MD (Physician) on 04/21/20 at 1738  Med List Status: <None>  Medication Order Taking? Sig Documenting Provider Last Dose Status Informant  acetaminophen (TYLENOL) 500 MG tablet [id number] Yes Take 2 tablets (1,000 mg total) by mouth 4 (four) times daily. Dwyane Dee, MD Taking Active Child  amLODipine (NORVASC) 5 MG tablet [id number] Yes Take 1 tablet (5 mg total) by mouth daily. Sherren Mocha, MD Taking Active   aspirin EC 81 MG tablet [id number] Yes Take 81 mg by mouth daily. Swallow whole. [provider] Taking Active   bimatoprost (LUMIGAN) 0.01 % SOLN [id number] Yes Place 1 drop  into both eyes at bedtime.  [provider] Taking Active Child  Calcium Carb-Cholecalciferol 500-400 MG-UNIT TABS [id number] Yes Take 1 tablet by mouth daily. [provider] Taking Active Child  carvedilol (COREG) 3.125 MG tablet [id number] Yes Take 1 tablet (3.125 mg total) by mouth 2 (two) times daily. Sherren Mocha, MD Taking Active   diclofenac Sodium (VOLTAREN) 1 % GEL [id number] Yes Apply 4 g topically 4 (four) times daily. Nafziger, [name], NP Taking Active   ezetimibe (ZETIA) 10 MG tablet [id number] Yes TAKE 1 TABLET(10 MG) BY MOUTH DAILY Sherren Mocha, MD Taking Active Child  furosemide (LASIX) 40 MG tablet [id number] Yes Take 40 mg by mouth every other day. [provider] Taking Active   Multiple Vitamin (MULTIVITAMIN) tablet [id number] Yes Take 1 tablet by mouth daily. [provider] Taking Active Child  Multiple Vitamins-Minerals (PRESERVISION AREDS 2+MULTI VIT  PO) [id number] Yes Take 2 capsules by mouth in the morning and at bedtime. [provider] Taking Active Child          Patient Active Problem List   Diagnosis Date Noted  . Acute CHF (congestive heart failure) ([place]) 02/14/2020  . Acute blood loss anemia 01/03/2020  . Eye irritation 01/01/2020  . Mitral valve stenosis   . Benign essential HTN   . Chronic diastolic CHF (congestive heart failure) ([place])   . Hip fracture ([place]) 12/30/2019  . Closed fracture of femur, intertrochanteric, left, initial encounter ([place])   . Acute on chronic diastolic CHF (congestive heart failure) ([place]) 02/25/2018  . S/P [name] (transcatheter aortic valve replacement) 02/25/2018  . Leg pain   . Hyperlipidemia   . Glaucoma   . Carotid stenosis, left   . Arthritis   . Urinary incontinence 02/12/2017  . Chronic bilateral low back pain 08/12/2016  . Spondylosis of lumbar joint 08/12/2016  . Bilateral leg pain 08/12/2016  . Neck mass 04/15/2015  . Status post hip hemiarthroplasty 07/31/2013  . CAD  (coronary artery disease) 07/31/2013  . CKD (chronic kidney disease), stage III ([place]) 07/31/2013  . Left bundle branch block 07/31/2013  . Severe calcific aortic stenosis 06/18/2010  . Diabetic polyneuropathy ([place]) 04/16/2010  . GLAUCOMA 01/06/2009  . CHEST PAIN, ATYPICAL 12/16/2008  . Bilateral shoulder pain 11/16/2007  . OSTEOARTHRITIS 02/02/2007  . Type 2 diabetes mellitus with renal manifestations, controlled ([place]) 10/24/2006  . Dyslipidemia 10/02/2006  . Essential hypertension 10/02/2006    Immunization History  Administered Date(s) Administered  . Hepatitis A 10/09/2005  . Influenza Split 01/02/2011, 12/24/2011  . Influenza Whole 04/01/2005, 01/01/2007, 01/02/2008, 01/06/2009, 01/17/2010  . Influenza, High Dose Seasonal PF 01/04/2014, 01/06/2015, 01/09/2016, 01/07/2017, 12/25/2017  . Influenza,inj,Quad PF,6+ Mos 12/25/2012  . Influenza-Unspecified 01/04/2020  . PFIZER(Purple Top)SARS-COV-2 Vaccination 04/02/2019, 04/29/2019, 12/30/2019  . Pneumococcal Conjugate-13 02/22/2013  . Pneumococcal Polysaccharide-23 04/01/2005  . Td 04/01/2000  . Tetanus 07/16/2013  . Zoster 10/09/2005    Conditions to be addressed/monitored:  Hypertension, Hyperlipidemia, Diabetes, Heart Failure, Coronary Artery Disease, Chronic Kidney Disease and Osteoarthritis  There are no care plans that you recently modified to display for this patient.   .Current Barriers:  . {pharmacybarriers:24917} . ***  Pharmacist Clinical Goal(s):  [name] Over the next *** days, patient will {PHARMACYGOALCHOICES:24921} through collaboration with PharmD and provider.  . ***  Interventions: . 1:1 collaboration with [name], NP regarding development and update of comprehensive plan of care as evidenced by provider attestation and co-signature . Inter-disciplinary care team collaboration (see longitudinal plan of care) . Comprehensive medication review performed; medication list updated in electronic medical  record  Hypertension (BP goal <140/90) -{CHL Controlled/Uncontrolled:[phone]} -Current treatment:  Amlodipine 5 mg 1 tablet daily   Carvedilol 3.125 mg twice daily - missing 2nd dose?  Furosemide 40 mg every other day -Medications previously tried: valsartan, HCTZ, losartan, isosorbide -Current home readings: *** -Current dietary habits: *** -Current exercise habits: *** -{ACTIONS;DENIES/REPORTS:21021675::"Denies"} hypotensive/hypertensive symptoms -Educated on {CCM BP Counseling:25124} -Counseled to monitor BP at home ***, document, and provide log at future appointments -{CCMPHARMDINTERVENTION:25122}  Hyperlipidemia: (LDL goal < 100) -{CHL Controlled/Uncontrolled:727 857 6583} -Current treatment: . Ezetimibe 10 mg 1 tablet daily . Aspirin 81 mg 1 tablet daily -Medications previously tried: statins -Current dietary patterns: *** -Current exercise habits: *** -Educated on {CCM HLD Counseling:25126} -{CCMPHARMDINTERVENTION:25122}  Diabetes (A1c goal <7%) -controlled -Current medications: . No medications -Medications previously tried: none  -Current home glucose readings . fasting glucose: *** .  post prandial glucose: *** -{ACTIONS;DENIES/REPORTS:21021675::"Denies"} hypoglycemic/hyperglycemic symptoms -Current meal patterns:  . breakfast: ***  . lunch: ***  . dinner: *** . snacks: *** . drinks: *** -Current exercise: *** -Educated on{CCM DM COUNSELING:25123} -Counseled to check feet daily and get yearly eye exams -{CCMPHARMDINTERVENTION:25122}  Heart Failure (Goal: control symptoms and prevent exacerbations) {CHL SNKNLZJQBH/ALPFXTKWIOXB:[id number]} Type: Diastolic -NYHA Class: II (slight limitation of activity) -Ejection fraction: 65-70% (Date:10/14/19) -Current treatment:  Carvedilol 3.125 mg twice daily   Furosemide 20 mg daily  -Medications previously tried: losartan (AKI) -Current home BP/HR readings: *** -Current dietary habits: *** -Current  exercise routine: *** -Educated on {CCM HF Counseling:25125} -{CCMPHARMDINTERVENTION:25122}  *** (Goal: ***) -{CHL Controlled/Uncontrolled:[phone]} -Current treatment  . *** -Medications previously tried: ***  -{CCMPHARMDINTERVENTION:25122}  Health Maintenance -Vaccine gaps: *** -Current therapy:  . *** -Educated on {ccm supplement counseling:25128} -{CCM Patient satisfied:25129} -{CCMPHARMDINTERVENTION:25122}   Patient Goals/Self-Care Activities . Over the next *** days, patient will:  - {pharmacypatientgoals:24919}  Follow Up Plan: {CM FOLLOW UP STMH:96222}   Medication Assistance: {MEDASSISTANCEINFO:25044}  Patient's preferred pharmacy is:  [facility] [place], [place] - [address] AT [address] [place] [address] Phone: [phone] Fax: [phone]  [facility] [place], [address] AT [facility] [address] [place] [address] Phone: [phone] Fax: [phone]  Uses pill box? {Yes or If no, why not?:20788} Pt endorses ***% compliance  We discussed: {Pharmacy options:24294} Patient decided to: {US Pharmacy JGGE:36629}  Follow Up:  {FOLLOWUP:24991}  Plan: {CM FOLLOW UP PLAN:25073}  ***

## 2020-05-11 ENCOUNTER — Other Ambulatory Visit: Payer: Self-pay

## 2020-05-12 ENCOUNTER — Ambulatory Visit (INDEPENDENT_AMBULATORY_CARE_PROVIDER_SITE_OTHER): Payer: Medicare Other | Admitting: Adult Health

## 2020-05-12 ENCOUNTER — Encounter: Payer: Self-pay | Admitting: Adult Health

## 2020-05-12 VITALS — BP 136/70 | Temp 97.7°F | Wt 147.0 lb

## 2020-05-12 DIAGNOSIS — I25119 Atherosclerotic heart disease of native coronary artery with unspecified angina pectoris: Secondary | ICD-10-CM | POA: Diagnosis not present

## 2020-05-12 DIAGNOSIS — R600 Localized edema: Secondary | ICD-10-CM | POA: Diagnosis not present

## 2020-05-12 DIAGNOSIS — I5032 Chronic diastolic (congestive) heart failure: Secondary | ICD-10-CM | POA: Diagnosis not present

## 2020-05-12 NOTE — Patient Instructions (Signed)
It was so good seeing you today!  I would like to continue with every other day dosing of the lasix. If you find that your need an extra dose on your days off, then go head and take 1/2 tab  Do your best to limit salt  Let me know if you need anything!

## 2020-05-12 NOTE — Progress Notes (Signed)
Subjective:    Patient ID: Sara Garza, female    DOB: 1923/07/03, 87 y.o.   MRN: [id number]  HPI  87 year old female who  has a past medical history of Arthritis, CAD (coronary artery disease), Carotid stenosis, left, Diabetes mellitus, Glaucoma, Hyperlipidemia, Hypertension, Leg pain, Osteoporosis, and Valvular heart disease.  She presents to the office today for concern of lower extremity edema.  She does have a history of chronic diastolic CHF and is currently on Lasix 40 mg every other day.  Earlier this week she had a day where her diet was higher in sodium than it normally is, when she woke up the next day her legs were more swollen past her baseline.  She did take an extra dose of Lasix and was able to "pee off 2 pounds".  Currently she is back to her baseline.  Denied worsening shortness of breath at this time.  Wt Readings from Last 3 Encounters:  05/12/20 147 lb (66.7 kg)  04/21/20 147 lb (66.7 kg)  03/07/20 150 lb (68 kg)    Review of Systems See HPI   Past Medical History:  Diagnosis Date  . Arthritis   . CAD (coronary artery disease)   . Carotid stenosis, left    50-60% stable  . Diabetes mellitus   . Glaucoma   . Hyperlipidemia   . Hypertension   . Leg pain    ABIs 2/18: normal bilaterally.  . Osteoporosis   . Valvular heart disease    Aortic Stenosis s/p [name] 2014 // Mitral stenosis // Echo 09/2018: EF > 65, mod LVH, severe focal basal hypertrophy, RVSP 39.8, mild to mod MR, mod to severe MS (mean 9), AVR with mild AI, severe LAE (similar to prior echo)    Social History   Socioeconomic History  . Marital status: Widowed    Spouse name: Not on file  . Number of children: Not on file  . Years of education: Not on file  . Highest education level: Not on file  Occupational History  . Not on file  Tobacco Use  . Smoking status: Passive Smoke Exposure - Never Smoker  . Smokeless tobacco: Never Used  Substance and Sexual Activity  . Alcohol use: Yes     Alcohol/week: 0.0 standard drinks    Comment: very seldom  . Drug use: No  . Sexual activity: Not Currently  Other Topics Concern  . Not on file  Social History Narrative   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      She likes to read and she takes classes at [employer] Strain: High Risk  . Difficulty of Paying Living Expenses: Hard  Food Insecurity: Not on file  Transportation Needs: No Transportation Needs  . Lack of Transportation (Medical): No  . Lack of Transportation (Non-Medical): No  Physical Activity: Not on file  Stress: Not on file  Social Connections: Not on file  Intimate Partner Violence: Not on file    Past Surgical History:  Procedure Laterality Date  . ANOMALOUS PULMONARY VENOUS RETURN REPAIR, TOTAL    . APPENDECTOMY    . CARDIAC VALVE REPLACEMENT  04/29/2012  . Cataracts Bilateral   . CESAREAN SECTION    . FOOT SURGERY     [name]  . HIP ARTHROPLASTY Right 07/31/2013   Procedure: ARTHROPLASTY BIPOLAR HIP;  Surgeon: [name], MD;  Location: [facility];  Service: Orthopedics;  Laterality: Right;  . INTRAMEDULLARY (IM) NAIL INTERTROCHANTERIC Left 01/01/2020   Procedure: INTRAMEDULLARY (IM) NAIL INTERTROCHANTRIC;  Surgeon: Rod Can, MD;  Location: [facility];  Service: Orthopedics;  Laterality: Left;  . PERCUTANEOUS CORONARY STENT INTERVENTION (PCI-S) N/A 01/02/2012   Procedure: PERCUTANEOUS CORONARY STENT INTERVENTION (PCI-S);  Surgeon: Sherren Mocha, MD;  Location: [facility] CATH LAB;  Service: Cardiovascular;  Laterality: N/A;    Family History  Problem Relation Age of Onset  . COPD Father   . Cancer Father        lung cancer  . Lung cancer Other     Allergies  Allergen Reactions  . Statins Other (See Comments)    Muscle aches  . Gabapentin Other (See Comments)    SOMNOLENCE    Current Outpatient Medications on File Prior to Visit  Medication Sig Dispense  Refill  . acetaminophen (TYLENOL) 500 MG tablet Take 2 tablets (1,000 mg total) by mouth 4 (four) times daily. 30 tablet 0  . amLODipine (NORVASC) 5 MG tablet Take 1 tablet (5 mg total) by mouth daily. 90 tablet 3  . aspirin EC 81 MG tablet Take 81 mg by mouth daily. Swallow whole.    . bimatoprost (LUMIGAN) 0.01 % SOLN Place 1 drop into both eyes at bedtime.     . Calcium Carb-Cholecalciferol 500-400 MG-UNIT TABS Take 1 tablet by mouth daily.    . carvedilol (COREG) 3.125 MG tablet Take 1 tablet (3.125 mg total) by mouth 2 (two) times daily. 180 tablet 3  . diclofenac Sodium (VOLTAREN) 1 % GEL Apply 4 g topically 4 (four) times daily. 100 g 2  . ezetimibe (ZETIA) 10 MG tablet TAKE 1 TABLET(10 MG) BY MOUTH DAILY 30 tablet 11  . furosemide (LASIX) 40 MG tablet Take 40 mg by mouth every other day.    . Multiple Vitamin (MULTIVITAMIN) tablet Take 1 tablet by mouth daily.    . Multiple Vitamins-Minerals (PRESERVISION AREDS 2+MULTI VIT PO) Take 2 capsules by mouth in the morning and at bedtime.     No current facility-administered medications on file prior to visit.    BP 136/70   Temp 97.7 F (36.5 C)   Wt 147 lb (66.7 kg)   BMI 29.69 kg/m       Objective:   Physical Exam Vitals and nursing note reviewed.  Constitutional:      Appearance: Normal appearance.  Cardiovascular:     Rate and Rhythm: Normal rate and regular rhythm.     Pulses: Normal pulses.     Heart sounds: Murmur (heard best at right sternal boarder) heard.    Pulmonary:     Effort: Pulmonary effort is normal.     Breath sounds: Normal breath sounds.  Musculoskeletal:     Right lower leg: Edema present.     Left lower leg: Edema present.  Skin:    General: Skin is warm and dry.     Capillary Refill: Capillary refill takes less than 2 seconds.  Neurological:     General: No focal deficit present.     Mental Status: She is alert and oriented to person, place, and time.  Psychiatric:        Mood and Affect:  Mood normal.        Behavior: Behavior normal.        Thought Content: Thought content normal.        Judgment: Judgment normal.       Assessment & Plan:  1. Chronic diastolic CHF (congestive heart failure) (HCC) -Continue with Lasix 40 mg every other day.  She was advised that if needed on the rare occasion that her legs swell past slightly and then she can take an extra half a dose of Lasix on her off day.  She was advised to follow-up if weight increases 5 pounds within 3 days. -Encouraged low-sodium diet  2. Lower extremity edema - See above   [name], NP

## 2020-05-16 ENCOUNTER — Telehealth: Payer: Self-pay | Admitting: Adult Health

## 2020-05-16 DIAGNOSIS — E1122 Type 2 diabetes mellitus with diabetic chronic kidney disease: Secondary | ICD-10-CM | POA: Diagnosis not present

## 2020-05-16 DIAGNOSIS — I130 Hypertensive heart and chronic kidney disease with heart failure and stage 1 through stage 4 chronic kidney disease, or unspecified chronic kidney disease: Secondary | ICD-10-CM | POA: Diagnosis not present

## 2020-05-16 DIAGNOSIS — N1832 Chronic kidney disease, stage 3b: Secondary | ICD-10-CM | POA: Diagnosis not present

## 2020-05-16 DIAGNOSIS — I5033 Acute on chronic diastolic (congestive) heart failure: Secondary | ICD-10-CM | POA: Diagnosis not present

## 2020-05-16 DIAGNOSIS — I0981 Rheumatic heart failure: Secondary | ICD-10-CM | POA: Diagnosis not present

## 2020-05-16 DIAGNOSIS — S72142D Displaced intertrochanteric fracture of left femur, subsequent encounter for closed fracture with routine healing: Secondary | ICD-10-CM | POA: Diagnosis not present

## 2020-05-16 NOTE — Telephone Encounter (Signed)
Patient is calling to let [name] know the physical therapist is going to be calling to request more pt.  Just an FYI.

## 2020-05-16 NOTE — Telephone Encounter (Signed)
Noted  

## 2020-05-17 ENCOUNTER — Telehealth: Payer: Self-pay | Admitting: Adult Health

## 2020-05-17 NOTE — Telephone Encounter (Signed)
Clair Gulling w/[facility] is calling in for verbal orders for re-certification for PT to continue to work with the pt 1 week 9.

## 2020-05-17 NOTE — Telephone Encounter (Signed)
Spoke with Sara Garza and advised him to proceed with orders for PT.  Nothing further needed.

## 2020-05-23 DIAGNOSIS — I130 Hypertensive heart and chronic kidney disease with heart failure and stage 1 through stage 4 chronic kidney disease, or unspecified chronic kidney disease: Secondary | ICD-10-CM | POA: Diagnosis not present

## 2020-05-23 DIAGNOSIS — N1832 Chronic kidney disease, stage 3b: Secondary | ICD-10-CM | POA: Diagnosis not present

## 2020-05-23 DIAGNOSIS — E1122 Type 2 diabetes mellitus with diabetic chronic kidney disease: Secondary | ICD-10-CM | POA: Diagnosis not present

## 2020-05-23 DIAGNOSIS — I5033 Acute on chronic diastolic (congestive) heart failure: Secondary | ICD-10-CM | POA: Diagnosis not present

## 2020-05-23 DIAGNOSIS — I0981 Rheumatic heart failure: Secondary | ICD-10-CM | POA: Diagnosis not present

## 2020-05-23 DIAGNOSIS — S72142D Displaced intertrochanteric fracture of left femur, subsequent encounter for closed fracture with routine healing: Secondary | ICD-10-CM | POA: Diagnosis not present

## 2020-05-24 DIAGNOSIS — J9811 Atelectasis: Secondary | ICD-10-CM | POA: Diagnosis not present

## 2020-05-24 DIAGNOSIS — Z955 Presence of coronary angioplasty implant and graft: Secondary | ICD-10-CM | POA: Diagnosis not present

## 2020-05-24 DIAGNOSIS — I5033 Acute on chronic diastolic (congestive) heart failure: Secondary | ICD-10-CM | POA: Diagnosis not present

## 2020-05-24 DIAGNOSIS — S72142D Displaced intertrochanteric fracture of left femur, subsequent encounter for closed fracture with routine healing: Secondary | ICD-10-CM | POA: Diagnosis not present

## 2020-05-24 DIAGNOSIS — E785 Hyperlipidemia, unspecified: Secondary | ICD-10-CM | POA: Diagnosis not present

## 2020-05-24 DIAGNOSIS — D62 Acute posthemorrhagic anemia: Secondary | ICD-10-CM | POA: Diagnosis not present

## 2020-05-24 DIAGNOSIS — M810 Age-related osteoporosis without current pathological fracture: Secondary | ICD-10-CM | POA: Diagnosis not present

## 2020-05-24 DIAGNOSIS — Z7982 Long term (current) use of aspirin: Secondary | ICD-10-CM | POA: Diagnosis not present

## 2020-05-24 DIAGNOSIS — I052 Rheumatic mitral stenosis with insufficiency: Secondary | ICD-10-CM | POA: Diagnosis not present

## 2020-05-24 DIAGNOSIS — E1142 Type 2 diabetes mellitus with diabetic polyneuropathy: Secondary | ICD-10-CM | POA: Diagnosis not present

## 2020-05-24 DIAGNOSIS — I2510 Atherosclerotic heart disease of native coronary artery without angina pectoris: Secondary | ICD-10-CM | POA: Diagnosis not present

## 2020-05-24 DIAGNOSIS — J9601 Acute respiratory failure with hypoxia: Secondary | ICD-10-CM | POA: Diagnosis not present

## 2020-05-24 DIAGNOSIS — D631 Anemia in chronic kidney disease: Secondary | ICD-10-CM | POA: Diagnosis not present

## 2020-05-24 DIAGNOSIS — M1712 Unilateral primary osteoarthritis, left knee: Secondary | ICD-10-CM | POA: Diagnosis not present

## 2020-05-24 DIAGNOSIS — I130 Hypertensive heart and chronic kidney disease with heart failure and stage 1 through stage 4 chronic kidney disease, or unspecified chronic kidney disease: Secondary | ICD-10-CM | POA: Diagnosis not present

## 2020-05-24 DIAGNOSIS — E1122 Type 2 diabetes mellitus with diabetic chronic kidney disease: Secondary | ICD-10-CM | POA: Diagnosis not present

## 2020-05-24 DIAGNOSIS — N1832 Chronic kidney disease, stage 3b: Secondary | ICD-10-CM | POA: Diagnosis not present

## 2020-05-24 DIAGNOSIS — I700 Atherosclerosis of aorta: Secondary | ICD-10-CM | POA: Diagnosis not present

## 2020-05-24 DIAGNOSIS — I447 Left bundle-branch block, unspecified: Secondary | ICD-10-CM | POA: Diagnosis not present

## 2020-05-24 DIAGNOSIS — M47814 Spondylosis without myelopathy or radiculopathy, thoracic region: Secondary | ICD-10-CM | POA: Diagnosis not present

## 2020-05-24 DIAGNOSIS — H409 Unspecified glaucoma: Secondary | ICD-10-CM | POA: Diagnosis not present

## 2020-05-24 DIAGNOSIS — Z96641 Presence of right artificial hip joint: Secondary | ICD-10-CM | POA: Diagnosis not present

## 2020-05-24 DIAGNOSIS — I6522 Occlusion and stenosis of left carotid artery: Secondary | ICD-10-CM | POA: Diagnosis not present

## 2020-05-24 DIAGNOSIS — Z952 Presence of prosthetic heart valve: Secondary | ICD-10-CM | POA: Diagnosis not present

## 2020-05-24 DIAGNOSIS — I0981 Rheumatic heart failure: Secondary | ICD-10-CM | POA: Diagnosis not present

## 2020-05-30 DIAGNOSIS — S72142D Displaced intertrochanteric fracture of left femur, subsequent encounter for closed fracture with routine healing: Secondary | ICD-10-CM | POA: Diagnosis not present

## 2020-05-30 DIAGNOSIS — I130 Hypertensive heart and chronic kidney disease with heart failure and stage 1 through stage 4 chronic kidney disease, or unspecified chronic kidney disease: Secondary | ICD-10-CM | POA: Diagnosis not present

## 2020-05-30 DIAGNOSIS — N1832 Chronic kidney disease, stage 3b: Secondary | ICD-10-CM | POA: Diagnosis not present

## 2020-05-30 DIAGNOSIS — I5033 Acute on chronic diastolic (congestive) heart failure: Secondary | ICD-10-CM | POA: Diagnosis not present

## 2020-05-30 DIAGNOSIS — E1122 Type 2 diabetes mellitus with diabetic chronic kidney disease: Secondary | ICD-10-CM | POA: Diagnosis not present

## 2020-05-30 DIAGNOSIS — I0981 Rheumatic heart failure: Secondary | ICD-10-CM | POA: Diagnosis not present

## 2020-05-31 ENCOUNTER — Ambulatory Visit (INDEPENDENT_AMBULATORY_CARE_PROVIDER_SITE_OTHER): Payer: Medicare Other | Admitting: Pharmacist

## 2020-05-31 DIAGNOSIS — I10 Essential (primary) hypertension: Secondary | ICD-10-CM

## 2020-05-31 DIAGNOSIS — I5032 Chronic diastolic (congestive) heart failure: Secondary | ICD-10-CM | POA: Diagnosis not present

## 2020-05-31 DIAGNOSIS — N184 Chronic kidney disease, stage 4 (severe): Secondary | ICD-10-CM | POA: Diagnosis not present

## 2020-05-31 NOTE — Progress Notes (Signed)
Chronic Care Management Pharmacy Note  06/05/2020 Name:  Sara Garza MRN:  [id number] DOB:  1924/02/16  Subjective: Sara Garza is an 86 y.o. year old female who is a primary patient of [name], NP.  The CCM team was consulted for assistance with disease management and care coordination needs.    Engaged with patient face to face for follow up visit in response to provider referral for pharmacy case management and/or care coordination services.   Consent to Services:  The patient was given information about Chronic Care Management services, agreed to services, and gave verbal consent prior to initiation of services.  Please see initial visit note for detailed documentation.   Patient Care Team: [name], NP as PCP - General (Family Medicine) Sherren Mocha, MD as PCP - Cardiology (Cardiology) Sherren Mocha, MD (Cardiology) Suella Broad, MD as Consulting Physician (Physical Medicine and Rehabilitation) Irine Seal, MD as Attending Physician (Urology) Viona Gilmore, [facility] as Pharmacist (Pharmacist)  Recent office visits: 05/12/20  [name], NP: Patient presented for CHF follow up. Continued Lasix 40 mg every other day.  03/07/20 [name], NP: Patient presented for hospitalization follow up.   02/03/20 [name], NP: Patient presented for left hip fracture follow up.  12/01/19 [name], NP: Patient presented for chronic conditions follow up. Prescribed ditropan XL 5 mg daily.  Recent consult visits: 04/21/20 Sherren Mocha, MD (cardiology): Patient presented for CHF follow up. Continued furosemide every other day.  04/14/20 Cherlynn June ([facility]): Unable to access notes.  03/01/20 Sherren Mocha, MD (cardiology): Patient presented for CHF follow up. Prescribed carvedilol 3.125 mg BID and decreased amlodipine to 5 mg daily.  02/21/20 Rod Can (ortho): Unable to access notes.   Hospital visits: 02/14/20 - 02/17/20 Patient admitted  for CHF exacerbation.  12/30/19-01/04/20 Patient admitted for femur fracture.  Objective:  Lab Results  Component Value Date   CREATININE 1.81 (H) 04/05/2020   BUN 47 (H) 04/05/2020   GFR 23.61 (L) 04/05/2020   GFRNONAA 23 (L) 03/07/2020   GFRAA 27 (L) 03/07/2020   NA 141 04/05/2020   K 5.1 04/05/2020   CALCIUM 9.4 04/05/2020   CO2 31 04/05/2020    Lab Results  Component Value Date/Time   HGBA1C 5.7 (H) 12/01/2019 10:14 AM   HGBA1C 6.2 11/26/2018 12:01 PM   GFR 23.61 (L) 04/05/2020 01:10 PM   GFR 36.25 (L) 11/26/2018 12:01 PM   MICROALBUR 3.1 (H) 06/18/2016 09:45 AM    Last diabetic Eye exam:  Lab Results  Component Value Date/Time   HMDIABEYEEXA No Retinopathy 05/13/2019 12:00 AM    Last diabetic Foot exam:  Lab Results  Component Value Date/Time   HMDIABFOOTEX done 04/16/2010 12:00 AM     Lab Results  Component Value Date   CHOL 203 (H) 12/01/2019   HDL 42 (L) 12/01/2019   LDLCALC 133 (H) 12/01/2019   LDLDIRECT 143.0 11/26/2018   TRIG 149 12/01/2019   CHOLHDL 4.8 12/01/2019    Hepatic Function Latest Ref Rng & Units 04/05/2020 02/14/2020 12/01/2019  Total Protein 6.0 - 8.3 g/dL 7.0 7.8 6.8  Albumin 3.5 - 5.2 g/dL 3.9 3.6 -  AST 0 - 37 U/L _0 ALT 0 - 35 U/L _1 Alk Phosphatase 39 - 117 U/L 105 88 -  Total Bilirubin 0.2 - 1.2 mg/dL 0.4 0.8 0.6  Bilirubin, Direct 0.0 - 0.3 mg/dL - - -    Lab Results  Component Value Date/Time   TSH  1.60 12/01/2019 10:14 AM   TSH 1.63 11/26/2018 12:01 PM    CBC Latest Ref Rng & Units 02/17/2020 02/16/2020 02/15/2020  WBC 4.0 - 10.5 K/uL 4.6 5.9 6.4  Hemoglobin 12.0 - 15.0 g/dL 10.3(L) 10.3(L) 10.0(L)  Hematocrit 36.0 - 46.0 % 31.1(L) 32.6(L) 32.3(L)  Platelets 150 - 400 K/uL PLATELET CLUMPS NOTED ON SMEAR, UNABLE TO ESTIMATE 239 229    Lab Results  Component Value Date/Time   VD25OH 37.79 04/29/2017 05:23 PM   VD25OH 38 07/31/2013 10:10 AM   VD25OH 41 02/22/2013 03:00 PM    Clinical ASCVD: Yes  The  ASCVD Risk score Mikey Bussing DC Jr., et al., 2013) failed to calculate for the following reasons:   The 2013 ASCVD risk score is only valid for ages 51 to 21    Depression screen PHQ 2/9 12/01/2019 12/08/2017 08/27/2017  Decreased Interest 0 0 0  Down, Depressed, Hopeless 0 0 0  PHQ - 2 Score 0 0 0  Some recent data might be hidden      Social History   Tobacco Use  Smoking Status Passive Smoke Exposure - Never Smoker  Smokeless Tobacco Never Used   BP Readings from Last 3 Encounters:  05/12/20 136/70  04/21/20 128/70  03/07/20 130/60   Pulse Readings from Last 3 Encounters:  04/21/20 79  03/07/20 78  03/01/20 100   Wt Readings from Last 3 Encounters:  05/12/20 147 lb (66.7 kg)  04/21/20 147 lb (66.7 kg)  03/07/20 150 lb (68 kg)    Assessment/Interventions: Review of patient past medical history, allergies, medications, health status, including review of consultants reports, laboratory and other test data, was performed as part of comprehensive evaluation and provision of chronic care management services.   SDOH:  (Social Determinants of Health) assessments and interventions performed: No   CCM Care Plan  Allergies  Allergen Reactions  . Statins Other (See Comments)    Muscle aches  . Gabapentin Other (See Comments)    SOMNOLENCE    Medications Reviewed Today    Reviewed by Hulda Humphrey, CMA (Certified Medical Assistant) on 05/12/20 at 1411  Med List Status: <None>  Medication Order Taking? Sig Documenting Provider Last Dose Status Informant  acetaminophen (TYLENOL) 500 MG tablet [id number] Yes Take 2 tablets (1,000 mg total) by mouth 4 (four) times daily. Dwyane Dee, MD Taking Active Child  amLODipine (NORVASC) 5 MG tablet [id number] Yes Take 1 tablet (5 mg total) by mouth daily. Sherren Mocha, MD Taking Active   aspirin EC 81 MG tablet [id number] Yes Take 81 mg by mouth daily. Swallow whole. [provider] Taking Active   bimatoprost (LUMIGAN) 0.01 % SOLN  [id number] Yes Place 1 drop into both eyes at bedtime.  [provider] Taking Active Child  Calcium Carb-Cholecalciferol 500-400 MG-UNIT TABS [id number] Yes Take 1 tablet by mouth daily. [provider] Taking Active Child  carvedilol (COREG) 3.125 MG tablet [id number] Yes Take 1 tablet (3.125 mg total) by mouth 2 (two) times daily. Sherren Mocha, MD Taking Active   diclofenac Sodium (VOLTAREN) 1 % GEL [id number] Yes Apply 4 g topically 4 (four) times daily. Nafziger, [name], NP Taking Active   ezetimibe (ZETIA) 10 MG tablet [id number] Yes TAKE 1 TABLET(10 MG) BY MOUTH DAILY Sherren Mocha, MD Taking Active Child  furosemide (LASIX) 40 MG tablet [id number] Yes Take 40 mg by mouth every other day. [provider] Taking Active   Multiple Vitamin (MULTIVITAMIN) tablet [id number] Yes Take 1 tablet by mouth daily. [provider] Taking Active Child  Multiple Vitamins-Minerals (PRESERVISION AREDS 2+MULTI VIT PO) [id number] Yes Take 2 capsules by mouth in the morning and at bedtime. [provider] Taking Active Child          Patient Active Problem List   Diagnosis Date Noted  . Acute CHF (congestive heart failure) ([place]) 02/14/2020  . Acute blood loss anemia 01/03/2020  . Eye irritation 01/01/2020  . Mitral valve stenosis   . Benign essential HTN   . Chronic diastolic CHF (congestive heart failure) ([place])   . Hip fracture ([place]) 12/30/2019  . Closed fracture of femur, intertrochanteric, left, initial encounter ([place])   . Acute on chronic diastolic CHF (congestive heart failure) ([place]) 02/25/2018  . S/P [name] (transcatheter aortic valve replacement) 02/25/2018  . Leg pain   . Hyperlipidemia   . Glaucoma   . Carotid stenosis, left   . Arthritis   . Urinary incontinence 02/12/2017  . Chronic bilateral low back pain 08/12/2016  . Spondylosis of lumbar joint 08/12/2016  . Bilateral leg pain 08/12/2016  . Neck mass 04/15/2015  . Status post hip hemiarthroplasty  07/31/2013  . CAD (coronary artery disease) 07/31/2013  . CKD (chronic kidney disease), stage III ([place]) 07/31/2013  . Left bundle branch block 07/31/2013  . Severe calcific aortic stenosis 06/18/2010  . Diabetic polyneuropathy ([place]) 04/16/2010  . GLAUCOMA 01/06/2009  . CHEST PAIN, ATYPICAL 12/16/2008  . Bilateral shoulder pain 11/16/2007  . OSTEOARTHRITIS 02/02/2007  . Type 2 diabetes mellitus with renal manifestations, controlled ([place]) 10/24/2006  . Dyslipidemia 10/02/2006  . Essential hypertension 10/02/2006    Immunization History  Administered Date(s) Administered  . Hepatitis A 10/09/2005  . Influenza Split 01/02/2011, 12/24/2011  . Influenza Whole 04/01/2005, 01/01/2007, 01/02/2008, 01/06/2009, 01/17/2010  . Influenza, High Dose Seasonal PF 01/04/2014, 01/06/2015, 01/09/2016, 01/07/2017, 12/25/2017  . Influenza,inj,Quad PF,6+ Mos 12/25/2012  . Influenza-Unspecified 01/04/2020  . PFIZER(Purple Top)SARS-COV-2 Vaccination 04/02/2019, 04/29/2019, 12/30/2019  . Pneumococcal Conjugate-13 02/22/2013  . Pneumococcal Polysaccharide-23 04/01/2005  . Td 04/01/2000  . Tetanus 07/16/2013  . Zoster 10/09/2005    Conditions to be addressed/monitored:  Hypertension, Hyperlipidemia, Diabetes, Heart Failure, Coronary Artery Disease, Chronic Kidney Disease and Osteoarthritis  Care Plan : CCM Pharmacy Care Plan  Updates made by Viona Gilmore, [place] since 06/05/2020 12:00 AM    Problem: Problem: Hypertension, Hyperlipidemia, Diabetes, Heart Failure, Coronary Artery Disease, Chronic Kidney Disease and Osteoarthritis     Long-Range Goal: Patient-Specific Goal   Start Date: 06/01/2020  Expected End Date: 06/01/2021  This Visit's Progress: On track  Priority: High  Note:   Current Barriers:  . Unable to maintain control of blood pressure  Pharmacist Clinical Goal(s):  [name] Over the next 90 days, patient will maintain control of blood pressure as evidenced by home blood pressure monitoring   through collaboration with PharmD and provider.   Interventions: . 1:1 collaboration with [name], NP regarding development and update of comprehensive plan of care as evidenced by provider attestation and co-signature . Inter-disciplinary care team collaboration (see longitudinal plan of care) . Comprehensive medication review performed; medication list updated in electronic medical record  Hypertension (BP goal <140/90) -Controlled -Current treatment:  Amlodipine 5 mg 1 tablet daily - in AM  Carvedilol 3.125 mg twice daily   Furosemide 40 mg every other day -Medications previously tried: valsartan, HCTZ, losartan, isosorbide -Current home readings: 127/65, 145/69 - 130-140s -Current dietary habits: did not discuss -Current exercise habits: did not discuss -Reports hypotensive/hypertensive symptoms -Educated on Importance of  home blood pressure monitoring; Proper BP monitoring technique; -Counseled to monitor BP at home a few times weekly, document, and provide log at future appointments -Recommended to continue current medication  Hyperlipidemia: (LDL goal < 100) -Uncontrolled -Current treatment: . Ezetimibe 10 mg 1 tablet daily . Aspirin 81 mg 1 tablet daily -Medications previously tried: statins -Current dietary patterns: did not discuss -Current exercise habits: did not discuss -Educated on Cholesterol goals;  Importance of limiting foods high in cholesterol; -Recommended to continue current medication Counseled on timing of Zetia can be taken in the morning if this improves adherence  Diabetes (A1c goal <7%) -Controlled -Current medications: . No medications -Medications previously tried: metformin  -Current home glucose readings: does not check -Denies hypoglycemic/hyperglycemic symptoms -Current meal patterns:  . breakfast: n/a  . lunch: n/a  . dinner: n/a . snacks: n/a . drinks: n/a -Current exercise: did not discuss -Educated onCarbohydrate  counting and/or plate method -Counseled to check feet daily and get yearly eye exams -Counseled on diet and exercise extensively   Heart Failure (Goal: manage symptoms and prevent exacerbations) -Controlled -Last ejection fraction: 65-70% (Date: 10/14/19) -HF type: Diastolic -NYHA Class: II (slight limitation of activity) -Current treatment:  Carvedilol 3.125 mg twice daily   Furosemide 40 mg every other daily  -Medications previously tried: losartan (AKI) -Current dietary habits: doesn't use salt and recommended lower sodium salt; patient doesn't use prepackaged food and rarely has canned foods -Current exercise habits: did not discuss -Educated on Importance of weighing daily; if you gain more than 3 pounds in one day or 5 pounds in one week, contact cardiologist -Recommended to continue current medication   Overactive bladder (Goal: minimize symptoms) -Controlled -Current treatment  . No medications -Medications previously tried: Myrbetriq  -Recommended to continue without medications  Health Maintenance -Vaccine gaps: none -Current therapy:  . None -Educated on Cost vs benefit of each product must be carefully weighed by individual consumer -Patient is satisfied with current therapy and denies issues -Recommended to continue without medications  Patient Goals/Self-Care Activities . Over the next 90 days, patient will:  - take medications as prescribed check blood pressure twice weekly, document, and provide at future appointments  Follow Up Plan: Telephone follow up appointment with care management team member scheduled for:       Medication Assistance: None required.  Patient affirms current coverage meets needs.  Patient's preferred pharmacy is:  [facility] [place], [place] - [address] [place] [address] Phone: [phone] Fax: [phone]  [facility],  [place] [address] AT [facility] [address] [place] [address] Phone: [phone] Fax: [phone]  Uses pill box? Yes Pt endorses 100% compliance  We discussed: Current pharmacy is preferred with insurance plan and patient is satisfied with pharmacy services Patient decided to: Continue current medication management strategy  Follow Up:  Patient agrees to Care Plan and Follow-up.  Plan: The care management team will reach out to the patient again over the next 14-21 days.  [name], PharmD [facility] Clinical Pharmacist [place] [facility]

## 2020-06-05 NOTE — Patient Instructions (Signed)
Visit Information  Goals Addressed   None    Patient Care Plan: CCM Pharmacy Care Plan    Problem Identified: Problem: Hypertension, Hyperlipidemia, Diabetes, Heart Failure, Coronary Artery Disease, Chronic Kidney Disease and Osteoarthritis     Long-Range Goal: Patient-Specific Goal   Start Date: 06/01/2020  Expected End Date: 06/01/2021  This Visit's Progress: On track  Priority: High  Note:   Current Barriers:  . Unable to maintain control of blood pressure  Pharmacist Clinical Goal(s):  [name] Over the next 90 days, patient will maintain control of blood pressure as evidenced by home blood pressure monitoring  through collaboration with PharmD and provider.   Interventions: . 1:1 collaboration with [name], NP regarding development and update of comprehensive plan of care as evidenced by provider attestation and co-signature . Inter-disciplinary care team collaboration (see longitudinal plan of care) . Comprehensive medication review performed; medication list updated in electronic medical record  Hypertension (BP goal <140/90) -Controlled -Current treatment:  Amlodipine 5 mg 1 tablet daily - in AM  Carvedilol 3.125 mg twice daily   Furosemide 40 mg every other day -Medications previously tried: valsartan, HCTZ, losartan, isosorbide -Current home readings: 127/65, 145/69 - 130-140s -Current dietary habits: did not discuss -Current exercise habits: did not discuss -Reports hypotensive/hypertensive symptoms -Educated on Importance of home blood pressure monitoring; Proper BP monitoring technique; -Counseled to monitor BP at home a few times weekly, document, and provide log at future appointments -Recommended to continue current medication  Hyperlipidemia: (LDL goal < 100) -Uncontrolled -Current treatment: . Ezetimibe 10 mg 1 tablet daily . Aspirin 81 mg 1 tablet daily -Medications previously tried: statins -Current dietary patterns: did not discuss -Current  exercise habits: did not discuss -Educated on Cholesterol goals;  Importance of limiting foods high in cholesterol; -Recommended to continue current medication Counseled on timing of Zetia can be taken in the morning if this improves adherence  Diabetes (A1c goal <7%) -Controlled -Current medications: . No medications -Medications previously tried: metformin  -Current home glucose readings: does not check -Denies hypoglycemic/hyperglycemic symptoms -Current meal patterns:  . breakfast: n/a  . lunch: n/a  . dinner: n/a . snacks: n/a . drinks: n/a -Current exercise: did not discuss -Educated onCarbohydrate counting and/or plate method -Counseled to check feet daily and get yearly eye exams -Counseled on diet and exercise extensively   Heart Failure (Goal: manage symptoms and prevent exacerbations) -Controlled -Last ejection fraction: 65-70% (Date: 10/14/19) -HF type: Diastolic -NYHA Class: II (slight limitation of activity) -Current treatment:  Carvedilol 3.125 mg twice daily   Furosemide 40 mg every other daily  -Medications previously tried: losartan (AKI) -Current dietary habits: doesn't use salt and recommended lower sodium salt; patient doesn't use prepackaged food and rarely has canned foods -Current exercise habits: did not discuss -Educated on Importance of weighing daily; if you gain more than 3 pounds in one day or 5 pounds in one week, contact cardiologist -Recommended to continue current medication   Overactive bladder (Goal: minimize symptoms) -Controlled -Current treatment  . No medications -Medications previously tried: Myrbetriq  -Recommended to continue without medications  Health Maintenance -Vaccine gaps: none -Current therapy:  . None -Educated on Cost vs benefit of each product must be carefully weighed by individual consumer -Patient is satisfied with current therapy and denies issues -Recommended to continue without medications  Patient  Goals/Self-Care Activities . Over the next 90 days, patient will:  - take medications as prescribed check blood pressure twice weekly, document, and provide at future appointments  Follow Up Plan: Telephone follow up appointment with care management team member scheduled for:       The patient verbalized understanding of instructions, educational materials, and care plan provided today and declined offer to receive copy of patient instructions, educational materials, and care plan.  The pharmacy team will reach out to the patient again over the next 14-21 days.   Sara Garza, [facility]

## 2020-06-06 DIAGNOSIS — E1122 Type 2 diabetes mellitus with diabetic chronic kidney disease: Secondary | ICD-10-CM | POA: Diagnosis not present

## 2020-06-06 DIAGNOSIS — S72142D Displaced intertrochanteric fracture of left femur, subsequent encounter for closed fracture with routine healing: Secondary | ICD-10-CM | POA: Diagnosis not present

## 2020-06-06 DIAGNOSIS — N1832 Chronic kidney disease, stage 3b: Secondary | ICD-10-CM | POA: Diagnosis not present

## 2020-06-06 DIAGNOSIS — I130 Hypertensive heart and chronic kidney disease with heart failure and stage 1 through stage 4 chronic kidney disease, or unspecified chronic kidney disease: Secondary | ICD-10-CM | POA: Diagnosis not present

## 2020-06-06 DIAGNOSIS — I5033 Acute on chronic diastolic (congestive) heart failure: Secondary | ICD-10-CM | POA: Diagnosis not present

## 2020-06-06 DIAGNOSIS — I0981 Rheumatic heart failure: Secondary | ICD-10-CM | POA: Diagnosis not present

## 2020-06-13 DIAGNOSIS — I130 Hypertensive heart and chronic kidney disease with heart failure and stage 1 through stage 4 chronic kidney disease, or unspecified chronic kidney disease: Secondary | ICD-10-CM | POA: Diagnosis not present

## 2020-06-13 DIAGNOSIS — E1122 Type 2 diabetes mellitus with diabetic chronic kidney disease: Secondary | ICD-10-CM | POA: Diagnosis not present

## 2020-06-13 DIAGNOSIS — I0981 Rheumatic heart failure: Secondary | ICD-10-CM | POA: Diagnosis not present

## 2020-06-13 DIAGNOSIS — S72142D Displaced intertrochanteric fracture of left femur, subsequent encounter for closed fracture with routine healing: Secondary | ICD-10-CM | POA: Diagnosis not present

## 2020-06-13 DIAGNOSIS — I5033 Acute on chronic diastolic (congestive) heart failure: Secondary | ICD-10-CM | POA: Diagnosis not present

## 2020-06-13 DIAGNOSIS — N1832 Chronic kidney disease, stage 3b: Secondary | ICD-10-CM | POA: Diagnosis not present

## 2020-06-20 ENCOUNTER — Telehealth: Payer: Self-pay | Admitting: Pharmacist

## 2020-06-20 NOTE — Chronic Care Management (AMB) (Signed)
Chronic Care Management Pharmacy Assistant   Name: Sara Garza  MRN: [id number] DOB: 1923/05/12  Reason for Encounter: Disease State   Conditions to be addressed/monitored: HTN  Recent office visits:  None  Recent consult visits:  None  Hospital visits:  None in previous 6 months  Medications: Outpatient Encounter Medications as of 06/20/2020  Medication Sig  . acetaminophen (TYLENOL) 500 MG tablet Take 2 tablets (1,000 mg total) by mouth 4 (four) times daily.  [name] amLODipine (NORVASC) 5 MG tablet Take 1 tablet (5 mg total) by mouth daily.  [name] aspirin EC 81 MG tablet Take 81 mg by mouth daily. Swallow whole.  . bimatoprost (LUMIGAN) 0.01 % SOLN Place 1 drop into both eyes at bedtime.   . Calcium Carb-Cholecalciferol 500-400 MG-UNIT TABS Take 1 tablet by mouth daily.  . carvedilol (COREG) 3.125 MG tablet Take 1 tablet (3.125 mg total) by mouth 2 (two) times daily.  . diclofenac Sodium (VOLTAREN) 1 % GEL Apply 4 g topically 4 (four) times daily.  [name] ezetimibe (ZETIA) 10 MG tablet TAKE 1 TABLET(10 MG) BY MOUTH DAILY  . furosemide (LASIX) 40 MG tablet Take 40 mg by mouth every other day.  . Multiple Vitamin (MULTIVITAMIN) tablet Take 1 tablet by mouth daily.  . Multiple Vitamins-Minerals (PRESERVISION AREDS 2+MULTI VIT PO) Take 2 capsules by mouth in the morning and at bedtime.   No facility-administered encounter medications on file as of 06/20/2020.   Reviewed chart prior to disease state call. Spoke with patient regarding BP  Recent Office Vitals: BP Readings from Last 3 Encounters:  05/12/20 136/70  04/21/20 128/70  03/07/20 130/60   Pulse Readings from Last 3 Encounters:  04/21/20 79  03/07/20 78  03/01/20 100    Wt Readings from Last 3 Encounters:  05/12/20 147 lb (66.7 kg)  04/21/20 147 lb (66.7 kg)  03/07/20 150 lb (68 kg)     Kidney Function Lab Results  Component Value Date/Time   CREATININE 1.81 (H) 04/05/2020 01:10 PM   CREATININE 1.86 (H) 03/14/2020  10:31 AM   CREATININE 1.83 (H) 03/07/2020 11:18 AM   GFR 23.61 (L) 04/05/2020 01:10 PM   GFRNONAA 23 (L) 03/07/2020 11:18 AM   GFRAA 27 (L) 03/07/2020 11:18 AM    BMP Latest Ref Rng & Units 04/05/2020 03/14/2020 03/07/2020  Glucose 70 - 99 mg/dL 115(H) 146(H) 100(H)  BUN 6 - 23 mg/dL 47(H) 50(H) 48(H)  Creatinine 0.40 - 1.20 mg/dL 1.81(H) 1.86(H) 1.83(H)  BUN/Creat Ratio 6 - 22 (calc) - 27(H) 26(H)  Sodium 135 - 145 mEq/L 141 143 144  Potassium 3.5 - 5.1 mEq/L 5.1 5.9(H) 5.4(H)  Chloride 96 - 112 mEq/L 104 103 104  CO2 19 - 32 mEq/L 31 27 32  Calcium 8.4 - 10.5 mg/dL 9.4 9.3 9.9   . Current antihypertensive regimen:   Amlodipine 5 mg 1 tablet daily - in AM  Carvedilol 3.125 mg twice daily   Furosemide 40 mg every other day . How often are you checking your Blood Pressure? 1-2x per week . Current home BP readings:  o 03.15 136/68 o 03.17 142/66 o 03.20 132/68 o 03.21 129/70 . What recent interventions/DTPs have been made by any provider to improve Blood Pressure control since last CPP Visit: none . Any recent hospitalizations or ED visits since last visit with CPP? No . What diet changes have been made to improve Blood Pressure Control?  o None  . What exercise is being done to improve your  Blood Pressure Control?  o None  Adherence Review: Is the patient currently on ACE/ARB medication? Yes Does the patient have >5 day gap between last estimated fill dates? No  I spoke with the patient and discussed medication adherence. She does not have an issue currently with her current medication. The patient states that she has been doing well.  She states he tries to eat more and healthy. She has increased her water intake. She denies ED visits since her last CPP follow-up. She states that she has not seen any changes in her blood pressure. The patient denies any side effects with his medication. Also, denies any problems with his current pharmacy.   Star Rating Drugs:  Dispensed  Quantity Pharmacy  Amlodipine 5 mg 01.20.2022 90 [facility]     Amilia (Aneta) Mare Ferrari, [place] Assistant [phone]

## 2020-06-21 DIAGNOSIS — I0981 Rheumatic heart failure: Secondary | ICD-10-CM | POA: Diagnosis not present

## 2020-06-21 DIAGNOSIS — E1122 Type 2 diabetes mellitus with diabetic chronic kidney disease: Secondary | ICD-10-CM | POA: Diagnosis not present

## 2020-06-21 DIAGNOSIS — I130 Hypertensive heart and chronic kidney disease with heart failure and stage 1 through stage 4 chronic kidney disease, or unspecified chronic kidney disease: Secondary | ICD-10-CM | POA: Diagnosis not present

## 2020-06-21 DIAGNOSIS — I5033 Acute on chronic diastolic (congestive) heart failure: Secondary | ICD-10-CM | POA: Diagnosis not present

## 2020-06-21 DIAGNOSIS — N1832 Chronic kidney disease, stage 3b: Secondary | ICD-10-CM | POA: Diagnosis not present

## 2020-06-21 DIAGNOSIS — S72142D Displaced intertrochanteric fracture of left femur, subsequent encounter for closed fracture with routine healing: Secondary | ICD-10-CM | POA: Diagnosis not present

## 2020-06-23 DIAGNOSIS — I6522 Occlusion and stenosis of left carotid artery: Secondary | ICD-10-CM | POA: Diagnosis not present

## 2020-06-23 DIAGNOSIS — I700 Atherosclerosis of aorta: Secondary | ICD-10-CM | POA: Diagnosis not present

## 2020-06-23 DIAGNOSIS — Z7982 Long term (current) use of aspirin: Secondary | ICD-10-CM | POA: Diagnosis not present

## 2020-06-23 DIAGNOSIS — I130 Hypertensive heart and chronic kidney disease with heart failure and stage 1 through stage 4 chronic kidney disease, or unspecified chronic kidney disease: Secondary | ICD-10-CM | POA: Diagnosis not present

## 2020-06-23 DIAGNOSIS — M47814 Spondylosis without myelopathy or radiculopathy, thoracic region: Secondary | ICD-10-CM | POA: Diagnosis not present

## 2020-06-23 DIAGNOSIS — I5033 Acute on chronic diastolic (congestive) heart failure: Secondary | ICD-10-CM | POA: Diagnosis not present

## 2020-06-23 DIAGNOSIS — M810 Age-related osteoporosis without current pathological fracture: Secondary | ICD-10-CM | POA: Diagnosis not present

## 2020-06-23 DIAGNOSIS — N1832 Chronic kidney disease, stage 3b: Secondary | ICD-10-CM | POA: Diagnosis not present

## 2020-06-23 DIAGNOSIS — I0981 Rheumatic heart failure: Secondary | ICD-10-CM | POA: Diagnosis not present

## 2020-06-23 DIAGNOSIS — D62 Acute posthemorrhagic anemia: Secondary | ICD-10-CM | POA: Diagnosis not present

## 2020-06-23 DIAGNOSIS — J9601 Acute respiratory failure with hypoxia: Secondary | ICD-10-CM | POA: Diagnosis not present

## 2020-06-23 DIAGNOSIS — D631 Anemia in chronic kidney disease: Secondary | ICD-10-CM | POA: Diagnosis not present

## 2020-06-23 DIAGNOSIS — I052 Rheumatic mitral stenosis with insufficiency: Secondary | ICD-10-CM | POA: Diagnosis not present

## 2020-06-23 DIAGNOSIS — Z955 Presence of coronary angioplasty implant and graft: Secondary | ICD-10-CM | POA: Diagnosis not present

## 2020-06-23 DIAGNOSIS — I447 Left bundle-branch block, unspecified: Secondary | ICD-10-CM | POA: Diagnosis not present

## 2020-06-23 DIAGNOSIS — E1122 Type 2 diabetes mellitus with diabetic chronic kidney disease: Secondary | ICD-10-CM | POA: Diagnosis not present

## 2020-06-23 DIAGNOSIS — Z952 Presence of prosthetic heart valve: Secondary | ICD-10-CM | POA: Diagnosis not present

## 2020-06-23 DIAGNOSIS — S72142D Displaced intertrochanteric fracture of left femur, subsequent encounter for closed fracture with routine healing: Secondary | ICD-10-CM | POA: Diagnosis not present

## 2020-06-23 DIAGNOSIS — I2510 Atherosclerotic heart disease of native coronary artery without angina pectoris: Secondary | ICD-10-CM | POA: Diagnosis not present

## 2020-06-23 DIAGNOSIS — E1142 Type 2 diabetes mellitus with diabetic polyneuropathy: Secondary | ICD-10-CM | POA: Diagnosis not present

## 2020-06-23 DIAGNOSIS — H409 Unspecified glaucoma: Secondary | ICD-10-CM | POA: Diagnosis not present

## 2020-06-23 DIAGNOSIS — Z96641 Presence of right artificial hip joint: Secondary | ICD-10-CM | POA: Diagnosis not present

## 2020-06-23 DIAGNOSIS — E785 Hyperlipidemia, unspecified: Secondary | ICD-10-CM | POA: Diagnosis not present

## 2020-06-23 DIAGNOSIS — M1712 Unilateral primary osteoarthritis, left knee: Secondary | ICD-10-CM | POA: Diagnosis not present

## 2020-06-23 DIAGNOSIS — J9811 Atelectasis: Secondary | ICD-10-CM | POA: Diagnosis not present

## 2020-06-26 DIAGNOSIS — H43813 Vitreous degeneration, bilateral: Secondary | ICD-10-CM | POA: Diagnosis not present

## 2020-06-26 DIAGNOSIS — H353132 Nonexudative age-related macular degeneration, bilateral, intermediate dry stage: Secondary | ICD-10-CM | POA: Diagnosis not present

## 2020-06-26 DIAGNOSIS — H401132 Primary open-angle glaucoma, bilateral, moderate stage: Secondary | ICD-10-CM | POA: Diagnosis not present

## 2020-06-27 ENCOUNTER — Telehealth: Payer: Self-pay

## 2020-06-27 DIAGNOSIS — S72142D Displaced intertrochanteric fracture of left femur, subsequent encounter for closed fracture with routine healing: Secondary | ICD-10-CM | POA: Diagnosis not present

## 2020-06-27 DIAGNOSIS — I0981 Rheumatic heart failure: Secondary | ICD-10-CM | POA: Diagnosis not present

## 2020-06-27 DIAGNOSIS — I130 Hypertensive heart and chronic kidney disease with heart failure and stage 1 through stage 4 chronic kidney disease, or unspecified chronic kidney disease: Secondary | ICD-10-CM | POA: Diagnosis not present

## 2020-06-27 DIAGNOSIS — E1122 Type 2 diabetes mellitus with diabetic chronic kidney disease: Secondary | ICD-10-CM | POA: Diagnosis not present

## 2020-06-27 DIAGNOSIS — I5033 Acute on chronic diastolic (congestive) heart failure: Secondary | ICD-10-CM | POA: Diagnosis not present

## 2020-06-27 DIAGNOSIS — N1832 Chronic kidney disease, stage 3b: Secondary | ICD-10-CM | POA: Diagnosis not present

## 2020-06-27 NOTE — Telephone Encounter (Signed)
Noted. Do to age I am fine with her BP

## 2020-07-06 DIAGNOSIS — E1122 Type 2 diabetes mellitus with diabetic chronic kidney disease: Secondary | ICD-10-CM | POA: Diagnosis not present

## 2020-07-06 DIAGNOSIS — I0981 Rheumatic heart failure: Secondary | ICD-10-CM | POA: Diagnosis not present

## 2020-07-06 DIAGNOSIS — I5033 Acute on chronic diastolic (congestive) heart failure: Secondary | ICD-10-CM | POA: Diagnosis not present

## 2020-07-06 DIAGNOSIS — N1832 Chronic kidney disease, stage 3b: Secondary | ICD-10-CM | POA: Diagnosis not present

## 2020-07-06 DIAGNOSIS — I130 Hypertensive heart and chronic kidney disease with heart failure and stage 1 through stage 4 chronic kidney disease, or unspecified chronic kidney disease: Secondary | ICD-10-CM | POA: Diagnosis not present

## 2020-07-06 DIAGNOSIS — S72142D Displaced intertrochanteric fracture of left femur, subsequent encounter for closed fracture with routine healing: Secondary | ICD-10-CM | POA: Diagnosis not present

## 2020-07-11 DIAGNOSIS — I0981 Rheumatic heart failure: Secondary | ICD-10-CM | POA: Diagnosis not present

## 2020-07-11 DIAGNOSIS — E1122 Type 2 diabetes mellitus with diabetic chronic kidney disease: Secondary | ICD-10-CM | POA: Diagnosis not present

## 2020-07-11 DIAGNOSIS — N1832 Chronic kidney disease, stage 3b: Secondary | ICD-10-CM | POA: Diagnosis not present

## 2020-07-11 DIAGNOSIS — I5033 Acute on chronic diastolic (congestive) heart failure: Secondary | ICD-10-CM | POA: Diagnosis not present

## 2020-07-11 DIAGNOSIS — I130 Hypertensive heart and chronic kidney disease with heart failure and stage 1 through stage 4 chronic kidney disease, or unspecified chronic kidney disease: Secondary | ICD-10-CM | POA: Diagnosis not present

## 2020-07-11 DIAGNOSIS — S72142D Displaced intertrochanteric fracture of left femur, subsequent encounter for closed fracture with routine healing: Secondary | ICD-10-CM | POA: Diagnosis not present

## 2020-07-18 ENCOUNTER — Telehealth: Payer: Self-pay | Admitting: Adult Health

## 2020-07-18 DIAGNOSIS — I130 Hypertensive heart and chronic kidney disease with heart failure and stage 1 through stage 4 chronic kidney disease, or unspecified chronic kidney disease: Secondary | ICD-10-CM | POA: Diagnosis not present

## 2020-07-18 DIAGNOSIS — N1832 Chronic kidney disease, stage 3b: Secondary | ICD-10-CM | POA: Diagnosis not present

## 2020-07-18 DIAGNOSIS — I0981 Rheumatic heart failure: Secondary | ICD-10-CM | POA: Diagnosis not present

## 2020-07-18 DIAGNOSIS — S72142D Displaced intertrochanteric fracture of left femur, subsequent encounter for closed fracture with routine healing: Secondary | ICD-10-CM | POA: Diagnosis not present

## 2020-07-18 DIAGNOSIS — I5033 Acute on chronic diastolic (congestive) heart failure: Secondary | ICD-10-CM | POA: Diagnosis not present

## 2020-07-18 DIAGNOSIS — E1122 Type 2 diabetes mellitus with diabetic chronic kidney disease: Secondary | ICD-10-CM | POA: Diagnosis not present

## 2020-07-18 NOTE — Telephone Encounter (Signed)
Sara Garza is calling and wanted to let provider know that they are discharging patient from home health and patient has reached maximum potential. Pt does still have chronic pain. CB is [phone]

## 2020-07-19 DIAGNOSIS — H401132 Primary open-angle glaucoma, bilateral, moderate stage: Secondary | ICD-10-CM | POA: Diagnosis not present

## 2020-07-19 DIAGNOSIS — H04123 Dry eye syndrome of bilateral lacrimal glands: Secondary | ICD-10-CM | POA: Diagnosis not present

## 2020-07-19 DIAGNOSIS — H353132 Nonexudative age-related macular degeneration, bilateral, intermediate dry stage: Secondary | ICD-10-CM | POA: Diagnosis not present

## 2020-07-19 DIAGNOSIS — H5201 Hypermetropia, right eye: Secondary | ICD-10-CM | POA: Diagnosis not present

## 2020-07-19 LAB — HM DIABETES EYE EXAM

## 2020-07-21 ENCOUNTER — Encounter: Payer: Self-pay | Admitting: Adult Health

## 2020-07-31 ENCOUNTER — Ambulatory Visit (INDEPENDENT_AMBULATORY_CARE_PROVIDER_SITE_OTHER): Payer: Medicare Other | Admitting: Cardiovascular Disease

## 2020-07-31 ENCOUNTER — Other Ambulatory Visit: Payer: Self-pay

## 2020-07-31 ENCOUNTER — Encounter: Payer: Self-pay | Admitting: Cardiovascular Disease

## 2020-07-31 VITALS — BP 132/57 | HR 79 | Ht 60.0 in | Wt 145.8 lb

## 2020-07-31 DIAGNOSIS — I359 Nonrheumatic aortic valve disorder, unspecified: Secondary | ICD-10-CM

## 2020-07-31 DIAGNOSIS — N184 Chronic kidney disease, stage 4 (severe): Secondary | ICD-10-CM | POA: Diagnosis not present

## 2020-07-31 DIAGNOSIS — I5032 Chronic diastolic (congestive) heart failure: Secondary | ICD-10-CM

## 2020-07-31 DIAGNOSIS — I25119 Atherosclerotic heart disease of native coronary artery with unspecified angina pectoris: Secondary | ICD-10-CM | POA: Diagnosis not present

## 2020-07-31 NOTE — Progress Notes (Signed)
Cardiology Office Note:    Date:  07/31/2020   ID:  [name], DOB May 20, 1923, MRN [id number]  PCP:  [name], NP   [facility]  Cardiologist:  Sherren Mocha, MD  Advanced Practice Provider:  No care team member to display Electrophysiologist:  None       Referring MD: [name], NP   Chief Complaint  Patient presents with  . Shortness of Breath    History of Present Illness:    CAPTOLA TESCHNER is a 87 y.o. female with a hx of coronary artery disease, aortic stenosis, and chronic diastolic heart failure, presenting for hospital follow-up evaluation.She underwent [name] for treatment of severe symptomatic aortic stenosis in 2014. She was treated with a 23 mm Medtronic Corevalve through the moderate risk SurTAVI aortic stenosis trial. She also is followed for moderate coronary artery disease which was evaluated with pressure wire analysis of the RCA and LCx demonstrating no hemodynamic evidence of flow-obstruction. 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   The patient is here alone today. She is doing very well. Today, she denies symptoms of palpitations, chest pain, orthopnea, PND, dizziness, or syncope. She's limited by shortness of breath but reports that her breathing is quite stable. Leg swelling is improved from baseline. She's taking lasix every other day.    Past Medical History:  Diagnosis Date  . Arthritis   . CAD (coronary artery disease)   . Carotid stenosis, left    50-60% stable  . Diabetes mellitus   . Glaucoma   . Hyperlipidemia   . Hypertension   . Leg pain    ABIs 2/18: normal bilaterally.  . Osteoporosis   . Valvular heart disease    Aortic Stenosis s/p [name] 2014 // Mitral stenosis //  Echo 09/2018: EF > 65, mod LVH, severe focal basal hypertrophy, RVSP 39.8, mild to mod MR, mod to severe MS (mean 9), AVR with mild AI, severe LAE (similar to prior echo)    Past Surgical History:  Procedure Laterality Date  . ANOMALOUS PULMONARY VENOUS RETURN REPAIR, TOTAL    . APPENDECTOMY    . CARDIAC VALVE REPLACEMENT  04/29/2012  . Cataracts Bilateral   . CESAREAN SECTION    . FOOT SURGERY     [name]  . HIP ARTHROPLASTY Right 07/31/2013   Procedure: ARTHROPLASTY BIPOLAR HIP;  Surgeon: [name], MD;  Location: [facility];  Service: Orthopedics;  Laterality: Right;  . INTRAMEDULLARY (IM) NAIL INTERTROCHANTERIC Left 01/01/2020   Procedure: INTRAMEDULLARY (IM) NAIL INTERTROCHANTRIC;  Surgeon: Rod Can, MD;  Location: [facility];  Service: Orthopedics;  Laterality: Left;  . PERCUTANEOUS CORONARY STENT INTERVENTION (PCI-S) N/A 01/02/2012   Procedure: PERCUTANEOUS CORONARY STENT INTERVENTION (PCI-S);  Surgeon: Sherren Mocha, MD;  Location: [facility] CATH LAB;  Service: Cardiovascular;  Laterality: N/A;    Current Medications: Current Meds  Medication Sig  . acetaminophen (TYLENOL) 500 MG tablet Take 2 tablets (1,000 mg total) by mouth 4 (four) times daily.  [name] amLODipine (NORVASC) 5 MG tablet Take 1 tablet (5 mg total) by mouth daily.  [name] aspirin EC 81 MG tablet Take 81 mg by mouth daily. Swallow whole.  . bimatoprost (LUMIGAN) 0.01 % SOLN Place 1 drop into both eyes at bedtime.   [name]  Calcium Carb-Cholecalciferol 500-400 MG-UNIT TABS Take 1 tablet by mouth daily.  . carvedilol (COREG) 3.125 MG tablet Take 1 tablet (3.125 mg total) by mouth 2 (two) times daily.  [name] ezetimibe (ZETIA) 10 MG tablet TAKE 1 TABLET(10 MG) BY MOUTH DAILY  . furosemide (LASIX) 40 MG tablet Take 40 mg by mouth every other day.  . Multiple Vitamin (MULTIVITAMIN) tablet Take 1 tablet by mouth daily.  . Multiple Vitamins-Minerals (PRESERVISION AREDS 2+MULTI VIT PO) Take 2 capsules by mouth in the morning and at bedtime.      Allergies:   Statins and Gabapentin   Social History   Socioeconomic History  . Marital status: Widowed    Spouse name: Not on file  . Number of children: Not on file  . Years of education: Not on file  . Highest education level: Not on file  Occupational History  . Not on file  Tobacco Use  . Smoking status: Passive Smoke Exposure - Never Smoker  . Smokeless tobacco: Never Used  Substance and Sexual Activity  . Alcohol use: Yes    Alcohol/week: 0.0 standard drinks    Comment: very seldom  . Drug use: No  . Sexual activity: Not Currently  Other Topics Concern  . Not on file  Social History Narrative   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      She likes to read and she takes classes at [employer] Strain: High Risk  . Difficulty of Paying Living Expenses: Hard  Food Insecurity: Not on file  Transportation Needs: No Transportation Needs  . Lack of Transportation (Medical): No  . Lack of Transportation (Non-Medical): No  Physical Activity: Not on file  Stress: Not on file  Social Connections: Not on file     Family History: The patient's family history includes COPD in her father; Cancer in her father; Lung cancer in an other family member.  ROS:   Please see the history of present illness.    All other systems reviewed and are negative.  EKGs/Labs/Other Studies Reviewed:    The following studies were reviewed today: Echo 10/14/2019: IMPRESSIONS    1. Aneursym of the mid-intraventricular septum, neck measures 1.5 cm,  contained. Left ventricular ejection fraction, by estimation, is 65 to  70%. The left ventricle has normal function. The left ventricle has no  regional wall motion abnormalities. There  is severe asymmetric left ventricular hypertrophy of the basal-septal  segment. Left ventricular diastolic parameters are consistent with Grade I  diastolic  dysfunction (impaired relaxation). Elevated left ventricular  end-diastolic pressure.  2. Right ventricular systolic function is hyperdynamic. The right  ventricular size is normal. There is normal pulmonary artery systolic  pressure.  3. Left atrial size was severely dilated.  4. The mitral valve is abnormal. Mild mitral valve regurgitation. Mild to  moderate mitral stenosis. The mean mitral valve gradient is 7.5 mmHg with  average heart rate of 70 bpm.  5. The aortic valve has been repaired/replaced. Aortic valve  regurgitation is not visualized. There is a 23 mm Medtronic  CoreValve-Evolut Pro prosthetic ([name]) valve present in the aortic  position. Aortic valve area, by VTI measures 1.21 cm. Aortic  valve mean gradient measures 13.7 mmHg. Aortic valve Vmax measures 2.59  m/s. Peak gradient 27 mmHg.  6. The inferior vena cava is normal in size with greater than 50%  respiratory  variability, suggesting right atrial pressure of 3 mmHg.  7. Evidence of atrial level shunting detected by color flow Doppler.  There is a small patent foramen ovale with predominantly left to right  shunting across the atrial septum.   Comparison(s): No significant change from prior study. Changes from prior  study are noted. 10/05/2018: LVEF >65%, severe focal basal septal  hypertrophy, moderaet to severe MS, 23 mm medtronic corevalve, mean  gradient 10 mmHg, peak gradient 24 mmHg.   EKG:  EKG is not ordered today.    Recent Labs: 12/01/2019: TSH 1.60 02/14/2020: B Natriuretic Peptide 592.1 02/16/2020: Magnesium 2.3 02/17/2020: Hemoglobin 10.3; Platelets PLATELET CLUMPS NOTED ON SMEAR, UNABLE TO ESTIMATE 04/05/2020: ALT 12; BUN 47; Creatinine, Ser 1.81; Potassium 5.1; Sodium 141  Recent Lipid Panel    Component Value Date/Time   CHOL 203 (H) 12/01/2019 1014   TRIG 149 12/01/2019 1014   HDL 42 (L) 12/01/2019 1014   CHOLHDL 4.8 12/01/2019 1014   VLDL 41.6 (H) 11/26/2018 1201   LDLCALC 133 (H)  12/01/2019 1014   LDLDIRECT 143.0 11/26/2018 1201     Risk Assessment/Calculations:       Physical Exam:    VS:  BP (!) 132/57   Pulse 79   Ht 5' (1.524 m)   Wt 145 lb 12.8 oz (66.1 kg)   SpO2 95%   BMI 28.47 kg/m     Wt Readings from Last 3 Encounters:  07/31/20 145 lb 12.8 oz (66.1 kg)  05/12/20 147 lb (66.7 kg)  04/21/20 147 lb (66.7 kg)     GEN: Well nourished, well developed elderly woman in no acute distress HEENT: Normal NECK: No JVD; No carotid bruits LYMPHATICS: No lymphadenopathy CARDIAC: RRR, 2/6 systolic murmur at the RUSB RESPIRATORY:  Clear to auscultation without rales, wheezing or rhonchi  ABDOMEN: Soft, non-tender, non-distended MUSCULOSKELETAL:  1+ ankle edema bilaterally improved from baseline; No deformity  SKIN: Warm and dry NEUROLOGIC:  Alert and oriented x 3 PSYCHIATRIC:  Normal affect   ASSESSMENT:    1. Chronic diastolic CHF (congestive heart failure) ([place])   2. Coronary artery disease involving native coronary artery of native heart with angina pectoris ([place])   3. Aortic valve disease   4. Chronic kidney disease, stage IV (severe) (HCC)    PLAN:    In order of problems listed above:  1. This is as good as I've seen Ajanee in some time. She is taking lasix every other day, volume looks pretty good, and her primary limitation is DOE without orthopnea or PND. Current medicines will be continued.  2. No angina at present. Continue current Rx.  3. Stable function of her [name] bioprosthesis on most recent echo.  4. Will update her labs. Last creatinine about 1.8 mg/dL. Overall appears stable.   Overall I think Nataliya is doing very well. She remains functionally independent at 87 years old. Her weight has trended down and she doesn't appear to be fluid overloaded today. I'll plan to see her back in 6 months.     Medication Adjustments/Labs and Tests Ordered: Current medicines are reviewed at length with the patient today.  Concerns regarding  medicines are outlined above.  Orders Placed This Encounter  Procedures  . Basic metabolic panel   No orders of the defined types were placed in this encounter.   Patient Instructions  Medication Instructions:  Your provider recommends that you continue on your current medications as directed. Please refer to the Current Medication list given to you  today.   *If you need a refill on your cardiac medications before your next appointment, please call your pharmacy*  Lab Work: TODAY! BMET If you have labs (blood work) drawn today and your tests are completely normal, you will receive your results only by: [name] MyChart Message (if you have MyChart) OR . A paper copy in the mail If you have any lab test that is abnormal or we need to change your treatment, we will call you to review the results.  Follow-Up: At [facility], you and your health needs are our priority.  As part of our continuing mission to provide you with exceptional heart care, we have created designated Provider Care Teams.  These Care Teams include your primary Cardiologist (physician) and Advanced Practice Providers (APPs -  Physician Assistants and Nurse Practitioners) who all work together to provide you with the care you need, when you need it. Your next appointment:   6 month(s) The format for your next appointment:   In Person Provider:   You may see Sherren Mocha, MD or one of the following Advanced Practice Providers on your designated Care Team:    Richardson Dopp, PA-C  Robbie Lis, [place]      Signed, Sherren Mocha, MD  07/31/2020 5:21 PM    [place]

## 2020-07-31 NOTE — Patient Instructions (Signed)
Medication Instructions:  Your provider recommends that you continue on your current medications as directed. Please refer to the Current Medication list given to you today.   *If you need a refill on your cardiac medications before your next appointment, please call your pharmacy*  Lab Work: TODAY! BMET If you have labs (blood work) drawn today and your tests are completely normal, you will receive your results only by: [name] MyChart Message (if you have MyChart) OR . A paper copy in the mail If you have any lab test that is abnormal or we need to change your treatment, we will call you to review the results.  Follow-Up: At [facility], you and your health needs are our priority.  As part of our continuing mission to provide you with exceptional heart care, we have created designated Provider Care Teams.  These Care Teams include your primary Cardiologist (physician) and Advanced Practice Providers (APPs -  Physician Assistants and Nurse Practitioners) who all work together to provide you with the care you need, when you need it. Your next appointment:   6 month(s) The format for your next appointment:   In Person Provider:   You may see Sherren Mocha, MD or one of the following Advanced Practice Providers on your designated Care Team:    Richardson Dopp, PA-C  [place], [place]

## 2020-08-01 LAB — BASIC METABOLIC PANEL
BUN/Creatinine Ratio: 30 — ABNORMAL HIGH (ref 12–28)
BUN: 41 mg/dL — ABNORMAL HIGH (ref 10–36)
CO2: 25 mmol/L (ref 20–29)
Calcium: 9.5 mg/dL (ref 8.7–10.3)
Chloride: 106 mmol/L (ref 96–106)
Creatinine, Ser: 1.38 mg/dL — ABNORMAL HIGH (ref 0.57–1.00)
Glucose: 70 mg/dL (ref 65–99)
Potassium: 5 mmol/L (ref 3.5–5.2)
Sodium: 145 mmol/L — ABNORMAL HIGH (ref 134–144)
eGFR: 35 mL/min/{1.73_m2} — ABNORMAL LOW (ref >59)

## 2020-08-02 ENCOUNTER — Telehealth: Payer: Self-pay | Admitting: Cardiovascular Disease

## 2020-08-02 NOTE — Telephone Encounter (Signed)
PT is requesting a callback to go over her lab results.Please advise

## 2020-08-02 NOTE — Telephone Encounter (Signed)
-----   Message from Sherren Mocha, MD sent at 08/01/2020  6:16 PM EDT ----- Labs excellent

## 2020-08-02 NOTE — Telephone Encounter (Signed)
Reviewed results with patient who verbalized understanding. 

## 2020-08-09 DIAGNOSIS — M25511 Pain in right shoulder: Secondary | ICD-10-CM | POA: Diagnosis not present

## 2020-08-15 ENCOUNTER — Telehealth: Payer: Self-pay | Admitting: Adult Health

## 2020-08-15 NOTE — Telephone Encounter (Signed)
Left message for patient to call back and schedule Medicare Annual Wellness Visit (AWV) either virtually or in office.   Last AWV 12/08/17  please schedule at anytime with [facility] Nurse Health Advisor 1 or 2   This should be a 45 minute visit.

## 2020-08-23 ENCOUNTER — Ambulatory Visit: Payer: Medicare Other | Admitting: Adult Health

## 2020-08-30 ENCOUNTER — Telehealth: Payer: Self-pay | Admitting: Pharmacist

## 2020-08-30 NOTE — Chronic Care Management (AMB) (Addendum)
    Chronic Care Management Pharmacy Assistant   Name: Sara Garza  MRN: [id number] DOB: 1924-02-12  08/30/20- Called patient to remind of appointment with [name]) on (08/31/20 at 11am by phone)   No answer, left message of appointment date, time and type of appointment (either telephone or in person). Left message to have all medications, supplements, blood pressure and/or blood sugar logs available during appointment and to return call if need to cancel or reschedule.  Patient called back and stated she needs to cancel above appointment and reschedule to 09/06/20 at 12pm by phone. Will send message to tatjana dellinger.   Star Rating Drug:  None.   [place]  Clinical Pharmacist Assistant [phone]

## 2020-08-31 ENCOUNTER — Telehealth: Payer: Medicare Other

## 2020-09-05 ENCOUNTER — Telehealth: Payer: Self-pay | Admitting: Pharmacist

## 2020-09-05 ENCOUNTER — Telehealth: Payer: Self-pay | Admitting: *Deleted

## 2020-09-05 NOTE — Telephone Encounter (Signed)
Error message

## 2020-09-05 NOTE — Chronic Care Management (AMB) (Signed)
    Chronic Care Management Pharmacy Assistant   Name: MAYLEA SORIA  MRN: [id number] DOB: 12-Oct-1923  09/05/20- Called patient to remind of appointment with [name]) on (09/06/20 by phone at 12pm)   No answer, left message of appointment date, time and type of appointment (either telephone or in person). Left message to have all medications, supplements, blood pressure and/or blood sugar logs available during appointment and to return call if need to reschedule.  Star Rating Drug:  None.   [place]  Clinical Pharmacist Assistant [phone]

## 2020-09-06 ENCOUNTER — Ambulatory Visit (INDEPENDENT_AMBULATORY_CARE_PROVIDER_SITE_OTHER): Payer: Medicare Other | Admitting: Pharmacist

## 2020-09-06 DIAGNOSIS — I10 Essential (primary) hypertension: Secondary | ICD-10-CM | POA: Diagnosis not present

## 2020-09-06 DIAGNOSIS — I5032 Chronic diastolic (congestive) heart failure: Secondary | ICD-10-CM

## 2020-09-06 NOTE — Progress Notes (Signed)
Chronic Care Management Pharmacy Note  09/06/2020 Name:  Sara Garza MRN:  [id number] DOB:  1923-08-15  Summary: BP is at goal < 140/90 per home BP readings  Recommendations/Changes made from today's visit: -Recommended separating administration of calcium tablets to twice daily  -Recommended continued monitoring of blood pressure and daily weights at home  Plan: -Apply for Lumigan patient assistance -Follow up BP assessment in 3 months  Subjective: Sara Garza is an 87 y.o. year old female who is a primary patient of [name], NP.  The CCM team was consulted for assistance with disease management and care coordination needs.    Engaged with patient face to face for follow up visit in response to provider referral for pharmacy case management and/or care coordination services.   Consent to Services:  The patient was given information about Chronic Care Management services, agreed to services, and gave verbal consent prior to initiation of services.  Please see initial visit note for detailed documentation.   Patient Care Team: [name], NP as PCP - General (Family Medicine) Sherren Mocha, MD as PCP - Cardiology (Cardiology) Sherren Mocha, MD (Cardiology) Suella Broad, MD as Consulting Physician (Physical Medicine and Rehabilitation) Irine Seal, MD as Attending Physician (Urology) Viona Gilmore, [facility] as Pharmacist (Pharmacist)  Recent office visits: 05/12/20  [name], NP: Patient presented for CHF follow up. Continued Lasix 40 mg every other day.  03/07/20 [name], NP: Patient presented for hospitalization follow up.   02/03/20 [name], NP: Patient presented for left hip fracture follow up.  12/01/19 [name], NP: Patient presented for chronic conditions follow up. Prescribed ditropan XL 5 mg daily.  Recent consult visits: 07/31/20 Sherren Mocha, MD (cardiology): Patient presented for CHF follow up. Continued furosemide every other  day. Repeat BMET completed.  04/21/20 Sherren Mocha, MD (cardiology): Patient presented for CHF follow up. Continued furosemide every other day.  04/14/20 Cherlynn June ([facility]): Unable to access notes.  03/01/20 Sherren Mocha, MD (cardiology): Patient presented for CHF follow up. Prescribed carvedilol 3.125 mg BID and decreased amlodipine to 5 mg daily.  02/21/20 Rod Can (ortho): Unable to access notes.   Hospital visits: 02/14/20 - 02/17/20 Patient admitted for CHF exacerbation.  12/30/19-01/04/20 Patient admitted for femur fracture.  Objective:  Lab Results  Component Value Date   CREATININE 1.38 (H) 07/31/2020   BUN 41 (H) 07/31/2020   GFR 23.61 (L) 04/05/2020   GFRNONAA 23 (L) 03/07/2020   GFRAA 27 (L) 03/07/2020   NA 145 (H) 07/31/2020   K 5.0 07/31/2020   CALCIUM 9.5 07/31/2020   CO2 25 07/31/2020    Lab Results  Component Value Date/Time   HGBA1C 5.7 (H) 12/01/2019 10:14 AM   HGBA1C 6.2 11/26/2018 12:01 PM   GFR 23.61 (L) 04/05/2020 01:10 PM   GFR 36.25 (L) 11/26/2018 12:01 PM   MICROALBUR 3.1 (H) 06/18/2016 09:45 AM    Last diabetic Eye exam:  Lab Results  Component Value Date/Time   HMDIABEYEEXA No Retinopathy 07/19/2020 12:00 AM    Last diabetic Foot exam:  Lab Results  Component Value Date/Time   HMDIABFOOTEX done 04/16/2010 12:00 AM     Lab Results  Component Value Date   CHOL 203 (H) 12/01/2019   HDL 42 (L) 12/01/2019   LDLCALC 133 (H) 12/01/2019   LDLDIRECT 143.0 11/26/2018   TRIG 149 12/01/2019   CHOLHDL 4.8 12/01/2019    Hepatic Function Latest Ref Rng & Units 04/05/2020 02/14/2020 12/01/2019  Total Protein 6.0 -  8.3 g/dL 7.0 7.8 6.8  Albumin 3.5 - 5.2 g/dL 3.9 3.6 -  AST 0 - 37 U/L '18 20 13  ' ALT 0 - 35 U/L '12 13 9  ' Alk Phosphatase 39 - 117 U/L 105 88 -  Total Bilirubin 0.2 - 1.2 mg/dL 0.4 0.8 0.6  Bilirubin, Direct 0.0 - 0.3 mg/dL - - -    Lab Results  Component Value Date/Time   TSH 1.60 12/01/2019 10:14 AM   TSH 1.63  11/26/2018 12:01 PM    CBC Latest Ref Rng & Units 02/17/2020 02/16/2020 02/15/2020  WBC 4.0 - 10.5 K/uL 4.6 5.9 6.4  Hemoglobin 12.0 - 15.0 g/dL 10.3(L) 10.3(L) 10.0(L)  Hematocrit 36.0 - 46.0 % 31.1(L) 32.6(L) 32.3(L)  Platelets 150 - 400 K/uL PLATELET CLUMPS NOTED ON SMEAR, UNABLE TO ESTIMATE 239 229    Lab Results  Component Value Date/Time   VD25OH 37.79 04/29/2017 05:23 PM   VD25OH 38 07/31/2013 10:10 AM   VD25OH 41 02/22/2013 03:00 PM    Clinical ASCVD: Yes  The ASCVD Risk score Mikey Bussing DC Jr., et al., 2013) failed to calculate for the following reasons:   The 2013 ASCVD risk score is only valid for ages 43 to 51    Depression screen PHQ 2/9 12/01/2019 12/08/2017 08/27/2017  Decreased Interest 0 0 0  Down, Depressed, Hopeless 0 0 0  PHQ - 2 Score 0 0 0  Some recent data might be hidden      Social History   Tobacco Use  Smoking Status Passive Smoke Exposure - Never Smoker  Smokeless Tobacco Never Used   BP Readings from Last 3 Encounters:  07/31/20 (!) 132/57  05/12/20 136/70  04/21/20 128/70   Pulse Readings from Last 3 Encounters:  07/31/20 79  04/21/20 79  03/07/20 78   Wt Readings from Last 3 Encounters:  07/31/20 145 lb 12.8 oz (66.1 kg)  05/12/20 147 lb (66.7 kg)  04/21/20 147 lb (66.7 kg)    Assessment/Interventions: Review of patient past medical history, allergies, medications, health status, including review of consultants reports, laboratory and other test data, was performed as part of comprehensive evaluation and provision of chronic care management services.   SDOH:  (Social Determinants of Health) assessments and interventions performed: No   CCM Care Plan  Allergies  Allergen Reactions  . Statins Other (See Comments)    Muscle aches  . Gabapentin Other (See Comments)    SOMNOLENCE    Medications Reviewed Today    Reviewed by Sherren Mocha, MD (Physician) on 07/31/20 at [place] List Status: <None>  Medication Order Taking? Sig  Documenting Provider Last Dose Status Informant  acetaminophen (TYLENOL) 500 MG tablet [id number] Yes Take 2 tablets (1,000 mg total) by mouth 4 (four) times daily. Dwyane Dee, MD Taking Active Child  amLODipine (NORVASC) 5 MG tablet [id number] Yes Take 1 tablet (5 mg total) by mouth daily. Sherren Mocha, MD Taking Active   aspirin EC 81 MG tablet [id number] Yes Take 81 mg by mouth daily. Swallow whole. [provider] Taking Active   bimatoprost (LUMIGAN) 0.01 % SOLN [id number] Yes Place 1 drop into both eyes at bedtime.  [provider] Taking Active Child  Calcium Carb-Cholecalciferol 500-400 MG-UNIT TABS [id number] Yes Take 1 tablet by mouth daily. [provider] Taking Active Child  carvedilol (COREG) 3.125 MG tablet [id number] Yes Take 1 tablet (3.125 mg total) by mouth 2 (two) times daily. Sherren Mocha, MD Taking Active   ezetimibe (ZETIA) 10 MG tablet  [id number] Yes TAKE 1 TABLET(10 MG) BY MOUTH DAILY Sherren Mocha, MD Taking Active Child  furosemide (LASIX) 40 MG tablet [id number] Yes Take 40 mg by mouth every other day. [provider] Taking Active   Multiple Vitamin (MULTIVITAMIN) tablet [id number] Yes Take 1 tablet by mouth daily. [provider] Taking Active Child  Multiple Vitamins-Minerals (PRESERVISION AREDS 2+MULTI VIT PO) [id number] Yes Take 2 capsules by mouth in the morning and at bedtime. [provider] Taking Active Child          Patient Active Problem List   Diagnosis Date Noted  . Acute CHF (congestive heart failure) ([place]) 02/14/2020  . Acute blood loss anemia 01/03/2020  . Eye irritation 01/01/2020  . Mitral valve stenosis   . Benign essential HTN   . Chronic diastolic CHF (congestive heart failure) ([place])   . Hip fracture ([place]) 12/30/2019  . Closed fracture of femur, intertrochanteric, left, initial encounter ([place])   . Acute on chronic diastolic CHF (congestive heart failure) ([place]) 02/25/2018  . S/P [name]  (transcatheter aortic valve replacement) 02/25/2018  . Leg pain   . Hyperlipidemia   . Glaucoma   . Carotid stenosis, left   . Arthritis   . Urinary incontinence 02/12/2017  . Chronic bilateral low back pain 08/12/2016  . Spondylosis of lumbar joint 08/12/2016  . Bilateral leg pain 08/12/2016  . Neck mass 04/15/2015  . Status post hip hemiarthroplasty 07/31/2013  . CAD (coronary artery disease) 07/31/2013  . CKD (chronic kidney disease), stage III ([place]) 07/31/2013  . Left bundle branch block 07/31/2013  . Severe calcific aortic stenosis 06/18/2010  . Diabetic polyneuropathy ([place]) 04/16/2010  . GLAUCOMA 01/06/2009  . CHEST PAIN, ATYPICAL 12/16/2008  . Bilateral shoulder pain 11/16/2007  . OSTEOARTHRITIS 02/02/2007  . Type 2 diabetes mellitus with renal manifestations, controlled ([place]) 10/24/2006  . Dyslipidemia 10/02/2006  . Essential hypertension 10/02/2006    Immunization History  Administered Date(s) Administered  . Hepatitis A 10/09/2005  . Influenza Split 01/02/2011, 12/24/2011  . Influenza Whole 04/01/2005, 01/01/2007, 01/02/2008, 01/06/2009, 01/17/2010  . Influenza, High Dose Seasonal PF 01/04/2014, 01/06/2015, 01/09/2016, 01/07/2017, 12/25/2017  . Influenza,inj,Quad PF,6+ Mos 12/25/2012  . Influenza-Unspecified 01/04/2020  . PFIZER(Purple Top)SARS-COV-2 Vaccination 04/02/2019, 04/29/2019, 12/30/2019  . Pneumococcal Conjugate-13 02/22/2013  . Pneumococcal Polysaccharide-23 04/01/2005  . Td 04/01/2000  . Tetanus 07/16/2013  . Zoster, Live 10/09/2005    Conditions to be addressed/monitored:  Hypertension, Hyperlipidemia, Diabetes, Heart Failure, Coronary Artery Disease, Chronic Kidney Disease and Osteoarthritis  Care Plan : CCM Pharmacy Care Plan  Updates made by Viona Gilmore, [place] since 09/06/2020 12:00 AM    Problem: Problem: Hypertension, Hyperlipidemia, Diabetes, Heart Failure, Coronary Artery Disease, Chronic Kidney Disease and Osteoarthritis      Long-Range Goal: Patient-Specific Goal   Start Date: 06/01/2020  Expected End Date: 06/01/2021  Recent Progress: On track  Priority: High  Note:   Current Barriers:  . Unable to maintain control of blood pressure  Pharmacist Clinical Goal(s):  [name] Over the next 180 days, patient will maintain control of blood pressure as evidenced by home blood pressure monitoring  through collaboration with PharmD and provider.   Interventions: . 1:1 collaboration with [name], NP regarding development and update of comprehensive plan of care as evidenced by provider attestation and co-signature . Inter-disciplinary care team collaboration (see longitudinal plan of care) . Comprehensive medication review performed; medication list updated in electronic medical record  Hypertension (BP goal <140/90) -Controlled -Current treatment:  Amlodipine 5 mg 1 tablet  daily - in AM  Carvedilol 3.125 mg twice daily   Furosemide 40 mg every other day -Medications previously tried: valsartan, HCTZ, losartan, isosorbide -Current home readings: 146/72, 122-146/60-70s -Current dietary habits: did not discuss -Current exercise habits: did not discuss -Denies hypotensive/hypertensive symptoms -Educated on Importance of home blood pressure monitoring; Proper BP monitoring technique; -Counseled to monitor BP at home a few times weekly, document, and provide log at future appointments -Recommended to continue current medication  Hyperlipidemia: (LDL goal < 100) -Uncontrolled -Current treatment: . Ezetimibe 10 mg 1 tablet daily . Aspirin 81 mg 1 tablet daily -Medications previously tried: statins -Current dietary patterns: did not discuss -Current exercise habits: did not discuss -Educated on Cholesterol goals;  Importance of limiting foods high in cholesterol; -Recommended to continue current medication Counseled on timing of Zetia can be taken in the morning if this improves adherence  Diabetes (A1c  goal <7%) -Controlled -Current medications: . No medications -Medications previously tried: metformin  -Current home glucose readings: does not check -Denies hypoglycemic/hyperglycemic symptoms -Current meal patterns:  . breakfast: n/a  . lunch: n/a  . dinner: n/a . snacks: n/a . drinks: n/a -Current exercise: did not discuss -Educated on Carbohydrate counting and/or plate method -Counseled to check feet daily and get yearly eye exams -Counseled on diet and exercise extensively   Heart Failure (Goal: manage symptoms and prevent exacerbations) -Controlled -Last ejection fraction: 65-70% (Date: 10/14/19) -HF type: Diastolic -NYHA Class: II (slight limitation of activity) -Current treatment:  Carvedilol 3.125 mg twice daily   Furosemide 40 mg every other daily  -Medications previously tried: losartan (AKI) -Current dietary habits: doesn't use salt and recommended lower sodium salt; patient doesn't use prepackaged food and rarely has canned foods -Current exercise habits: did not discuss -Educated on Importance of weighing daily; if you gain more than 3 pounds in one day or 5 pounds in one week, contact cardiologist -Recommended to continue current medication   Overactive bladder (Goal: minimize symptoms) -Controlled -Current treatment  . No medications -Medications previously tried: Myrbetriq  -Recommended to continue without medications  Health Maintenance -Vaccine gaps: none -Current therapy:   Amoxicillin 500 mg 4 caps PRN dental procedures   Calcium Carbonate + Vit D 600 mg - 400 units 2 tablets daily  Multivitamin daily   Lumigan ($100+ in donut hole) - Patient stated challenges affording Lumigan. She was given new eye drops by her ophthalmologist but cannot remember which one.    Preservision AREDS 2 two capsules twice daily -Educated on Cost vs benefit of each product must be carefully weighed by individual consumer -Patient is satisfied with current therapy  and denies issues -Educated on body can only absorb 600 mg of calcium at one time and recommended separating to twice daily administration Assessed patient finances. Plan to apply for patient assistance for Lumigan.  Patient Goals/Self-Care Activities . Over the next 180 days, patient will:  - take medications as prescribed check blood pressure twice weekly, document, and provide at future appointments  Follow Up Plan: Telephone follow up appointment with care management team member scheduled for: 6 months       Medication Assistance: None required.  Patient affirms current coverage meets needs.   Compliance/Adherence/Medication fill history: Care Gaps: Shingrix, urine microalbumin, foot exam, A1c  Star-Rating Drugs: None   Patient's preferred pharmacy is:  [facility] [place], [place] - [address] AT [address] [place] [address] Phone: [phone] Fax: [phone]  [id number] [address] [place], [address] AT [address] [place] [address] Phone: [phone] Fax: [facility] [place], [place] - [address] AT [address] [place] [address] Phone: [phone] Fax: [phone]  Uses pill box? Yes Pt endorses 100% compliance  We discussed: Current pharmacy is preferred with insurance plan and patient is satisfied with pharmacy services Patient decided to: Continue current medication management strategy  Follow Up:  Patient agrees to Care Plan and Follow-up.  Plan: Telephone follow up appointment with care management team member scheduled for:  6 months  [name], PharmD [place] Pharmacist [place] at [place] [phone]

## 2020-09-07 DIAGNOSIS — Z23 Encounter for immunization: Secondary | ICD-10-CM | POA: Diagnosis not present

## 2020-09-08 ENCOUNTER — Ambulatory Visit (INDEPENDENT_AMBULATORY_CARE_PROVIDER_SITE_OTHER): Payer: Medicare Other | Admitting: Podiatry

## 2020-09-08 ENCOUNTER — Other Ambulatory Visit: Payer: Self-pay

## 2020-09-08 DIAGNOSIS — B351 Tinea unguium: Secondary | ICD-10-CM | POA: Diagnosis not present

## 2020-09-08 DIAGNOSIS — M79675 Pain in left toe(s): Secondary | ICD-10-CM | POA: Diagnosis not present

## 2020-09-08 DIAGNOSIS — M79674 Pain in right toe(s): Secondary | ICD-10-CM

## 2020-09-12 DIAGNOSIS — M25511 Pain in right shoulder: Secondary | ICD-10-CM | POA: Diagnosis not present

## 2020-09-14 ENCOUNTER — Encounter: Payer: Self-pay | Admitting: Podiatry

## 2020-09-14 NOTE — Progress Notes (Signed)
  Subjective:  Patient ID: Sara Garza, female    DOB: Jan 08, 1924,  MRN: [id number]  Chief Complaint  Patient presents with   Nail Problem    Nail trim    87 y.o. female returns for the above complaint.  Patient presents with thickened elongated dystrophic toenails x10.  Patient states is painful to touch she is not able to do it herself.  She would like for me to debride them down.  She denies any other acute complaints.  Objective:  There were no vitals filed for this visit. Podiatric Exam: Vascular: dorsalis pedis and posterior tibial pulses are palpable bilateral. Capillary return is immediate. Temperature gradient is WNL. Skin turgor WNL  Sensorium: Normal Semmes Weinstein monofilament test. Normal tactile sensation bilaterally. Nail Exam: Pt has thick disfigured discolored nails with subungual debris noted bilateral entire nail hallux through fifth toenails.  Pain on palpation to the nails. Ulcer Exam: There is no evidence of ulcer or pre-ulcerative changes or infection. Orthopedic Exam: Muscle tone and strength are WNL. No limitations in general ROM. No crepitus or effusions noted. HAV  B/L.  Hammer toes 2-5  B/L. Skin: No Porokeratosis. No infection or ulcers    Assessment & Plan:   1. Pain due to onychomycosis of toenails of both feet     Patient was evaluated and treated and all questions answered.  Onychomycosis with pain  -Nails palliatively debrided as below. -Educated on self-care  Procedure: Nail Debridement Rationale: pain  Type of Debridement: manual, sharp debridement. Instrumentation: Nail nipper, rotary burr. Number of Nails: 10  Procedures and Treatment: Consent by patient was obtained for treatment procedures. The patient understood the discussion of treatment and procedures well. All questions were answered thoroughly reviewed. Debridement of mycotic and hypertrophic toenails, 1 through 5 bilateral and clearing of subungual debris. No ulceration, no  infection noted.  Return Visit-Office Procedure: Patient instructed to return to the office for a follow up visit 3 months for continued evaluation and treatment.  Boneta Lucks, DPM    Return in about 3 months (around 12/09/2020) for rfc with masyer or [place].

## 2020-09-15 ENCOUNTER — Telehealth: Payer: Self-pay | Admitting: Pharmacist

## 2020-09-15 NOTE — Chronic Care Management (AMB) (Signed)
    Chronic Care Management Pharmacy Assistant   Name: Sara Garza  MRN: [id number] DOB: 1923/09/29  Per [name] clinical pharmacist request. I completed Patient Assistance application for Lumigan eye drops. I sent application [name] with Watt Climes instructions to print and mail to patient. Per [name] I do not need to call and inform patient. Patient is aware.   [place]  Clinical Pharmacist Assistant [phone]

## 2020-10-04 ENCOUNTER — Telehealth: Payer: Self-pay | Admitting: Adult Health

## 2020-10-04 NOTE — Telephone Encounter (Signed)
Tried calling patient to schedule Medicare Annual Wellness Visit (AWV) either virtually or in office.  No answer   Last AWV 12/08/17  please schedule at anytime with [facility] Nurse Health Advisor 1 or 2   This should be a 45 minute visit.

## 2020-10-04 NOTE — Telephone Encounter (Signed)
Pt called the office back stated that she is not interested in this kind of appointment

## 2020-10-04 NOTE — Telephone Encounter (Signed)
Please advise 

## 2020-10-04 NOTE — Telephone Encounter (Signed)
Patient notified of update  and verbalized understanding. 

## 2020-10-04 NOTE — Telephone Encounter (Signed)
Pt call and stated she want to know is it alright to take Qc Kinetix for her shoulder pain and want a call back.

## 2020-10-17 DIAGNOSIS — M25511 Pain in right shoulder: Secondary | ICD-10-CM | POA: Diagnosis not present

## 2020-10-30 ENCOUNTER — Telehealth: Payer: Self-pay | Admitting: Adult Health

## 2020-10-30 NOTE — Telephone Encounter (Signed)
Left message for patient to call back and schedule Medicare Annual Wellness Visit (AWV) either virtually or in office.   Last AWV ;12/08/17  please schedule at anytime with [facility] Nurse Health Advisor 1 or 2   This should be a 45 minute visit.

## 2020-11-03 DIAGNOSIS — M25411 Effusion, right shoulder: Secondary | ICD-10-CM | POA: Diagnosis not present

## 2020-11-03 DIAGNOSIS — M25511 Pain in right shoulder: Secondary | ICD-10-CM | POA: Diagnosis not present

## 2020-11-07 DIAGNOSIS — H353132 Nonexudative age-related macular degeneration, bilateral, intermediate dry stage: Secondary | ICD-10-CM | POA: Diagnosis not present

## 2020-11-07 DIAGNOSIS — H43813 Vitreous degeneration, bilateral: Secondary | ICD-10-CM | POA: Diagnosis not present

## 2020-11-07 DIAGNOSIS — M25511 Pain in right shoulder: Secondary | ICD-10-CM | POA: Diagnosis not present

## 2020-11-15 ENCOUNTER — Other Ambulatory Visit: Payer: Self-pay | Admitting: Cardiovascular Disease

## 2020-11-15 DIAGNOSIS — I25119 Atherosclerotic heart disease of native coronary artery with unspecified angina pectoris: Secondary | ICD-10-CM

## 2020-11-15 DIAGNOSIS — I10 Essential (primary) hypertension: Secondary | ICD-10-CM

## 2020-11-15 DIAGNOSIS — I359 Nonrheumatic aortic valve disorder, unspecified: Secondary | ICD-10-CM

## 2020-11-30 DIAGNOSIS — M25511 Pain in right shoulder: Secondary | ICD-10-CM | POA: Diagnosis not present

## 2020-12-01 ENCOUNTER — Telehealth: Payer: Self-pay | Admitting: Cardiovascular Disease

## 2020-12-01 DIAGNOSIS — I5032 Chronic diastolic (congestive) heart failure: Secondary | ICD-10-CM

## 2020-12-01 DIAGNOSIS — I359 Nonrheumatic aortic valve disorder, unspecified: Secondary | ICD-10-CM

## 2020-12-01 NOTE — Telephone Encounter (Signed)
pt went to see a Dr. yesterday and was told something about possible surgery on her shoulder.. pt would like Dr. York Cerise opinion on this.. pt would also like to know if she is needing an echo prior to seeing Dr. Burt Knack... please advise

## 2020-12-01 NOTE — Telephone Encounter (Signed)
Per Dr. Leretha Dykes echo to risk stratify for potential shoulder surgery.  Pt aware and in agreement.

## 2020-12-18 ENCOUNTER — Ambulatory Visit: Payer: Medicare Other | Admitting: Podiatry

## 2020-12-19 ENCOUNTER — Telehealth: Payer: Self-pay | Admitting: Pharmacist

## 2020-12-19 NOTE — Chronic Care Management (AMB) (Signed)
Chronic Care Management Pharmacy Assistant   Name: Sara Garza  MRN: [id number] DOB: July 25, 1923    Reason for Encounter: Disease State / Hypertension Assessment Call    Conditions to be addressed/monitored: HTN    Recent office visits:  None  Recent consult visits:  09-12-2020 Latanya Maudlin A (Orthopedic Surgery) - Patient presented for right shoulder pain. No medication changes.   09-08-2020 Felipa Furnace, DPM (Podiatry) - Patient presented for Pain due to onychomycosis of toenails of both feet. No medication changes.  Hospital visits:  None in previous 6 months  Medications: Outpatient Encounter Medications as of 12/19/2020  Medication Sig   acetaminophen (TYLENOL) 500 MG tablet Take 2 tablets (1,000 mg total) by mouth 4 (four) times daily.   amLODipine (NORVASC) 5 MG tablet Take 1 tablet (5 mg total) by mouth daily.   amoxicillin (AMOXIL) 500 MG capsule TAKE 4 CAPSULES BY MOUTH ONE HOUR PRIOR TO DENTAL PROCEDURES   aspirin EC 81 MG tablet Take 81 mg by mouth daily. Swallow whole.   bimatoprost (LUMIGAN) 0.01 % SOLN Place 1 drop into both eyes at bedtime.    Calcium Carb-Cholecalciferol 500-400 MG-UNIT TABS Take 1 tablet by mouth daily.   carvedilol (COREG) 3.125 MG tablet Take 1 tablet (3.125 mg total) by mouth 2 (two) times daily.   ezetimibe (ZETIA) 10 MG tablet TAKE 1 TABLET(10 MG) BY MOUTH DAILY   furosemide (LASIX) 40 MG tablet Take 40 mg by mouth every other day.   Multiple Vitamin (MULTIVITAMIN) tablet Take 1 tablet by mouth daily.   Multiple Vitamins-Minerals (PRESERVISION AREDS 2+MULTI VIT PO) Take 2 capsules by mouth in the morning and at bedtime.   No facility-administered encounter medications on file as of 12/19/2020.  Reviewed chart prior to disease state call. Spoke with patient regarding BP  Recent Office Vitals: BP Readings from Last 3 Encounters:  07/31/20 (!) 132/57  05/12/20 136/70  04/21/20 128/70   Pulse Readings from Last 3 Encounters:   07/31/20 79  04/21/20 79  03/07/20 78    Wt Readings from Last 3 Encounters:  07/31/20 145 lb 12.8 oz (66.1 kg)  05/12/20 147 lb (66.7 kg)  04/21/20 147 lb (66.7 kg)     Kidney Function Lab Results  Component Value Date/Time   CREATININE 1.38 (H) 07/31/2020 04:10 PM   CREATININE 1.81 (H) 04/05/2020 01:10 PM   CREATININE 1.86 (H) 03/14/2020 10:31 AM   CREATININE 1.83 (H) 03/07/2020 11:18 AM   GFR 23.61 (L) 04/05/2020 01:10 PM   GFRNONAA 23 (L) 03/07/2020 11:18 AM   GFRAA 27 (L) 03/07/2020 11:18 AM    BMP Latest Ref Rng & Units 07/31/2020 04/05/2020 03/14/2020  Glucose 65 - 99 mg/dL 70 115(H) 146(H)  BUN 10 - 36 mg/dL 41(H) 47(H) 50(H)  Creatinine 0.57 - 1.00 mg/dL 1.38(H) 1.81(H) 1.86(H)  BUN/Creat Ratio 12 - 28 30(H) - 27(H)  Sodium 134 - 144 mmol/L 145(H) 141 143  Potassium 3.5 - 5.2 mmol/L 5.0 5.1 5.9(H)  Chloride 96 - 106 mmol/L 106 104 103  CO2 20 - 29 mmol/L 25 31 27   Calcium 8.7 - 10.3 mg/dL 9.5 9.4 9.3    Current antihypertensive regimen:  Amlodipine 5 mg 1 tablet daily - in AM Carvedilol 3.125 mg twice daily  Furosemide 40 mg every other day How often are you checking your Blood Pressure? Patient reports she is checking her pressures a few times a week.  Current home BP readings: She reported she does not keep a  log and did not have her machine with her would call me with a reading on tomorrow and check this evening. Office reading on 07-31-20 was 135/57 What recent interventions/DTPs have been made by any provider to improve Blood Pressure control since last CPP Visit: Patient reports none Any recent hospitalizations or ED visits since last visit with CPP? None  Adherence Review: Is the patient currently on ACE/ARB medication? No Does the patient have >5 day gap between last estimated fill dates? No Notes: Inquired with patient if she had received her paperwork for her [place] and returned it to Dr Huston Foley office she reports she did receive the paperwork but  did not bother filling it out as they ended up taking her off the drops, updated spreadsheet.   Care Gaps: Zoster Vaccine - Overdue Urine Micro - Overdue Foot Exam - Done 09-12-2020 COVID Booster #4 (Pfizer) -Overdue HGB A1C - Overdue Flu Vaccine - Overdue  CCM - 03-07-21 AWV - 2019- MSG sent to Ramond Craver CMA to schedule.  Star Rating Drugs: None  Ned Clines [place] Clinical Pharmacist Assistant [phone]

## 2020-12-25 ENCOUNTER — Ambulatory Visit (HOSPITAL_COMMUNITY): Payer: Medicare Other | Attending: Cardiology

## 2020-12-25 ENCOUNTER — Other Ambulatory Visit: Payer: Self-pay

## 2020-12-25 DIAGNOSIS — I5032 Chronic diastolic (congestive) heart failure: Secondary | ICD-10-CM | POA: Diagnosis not present

## 2020-12-25 DIAGNOSIS — I359 Nonrheumatic aortic valve disorder, unspecified: Secondary | ICD-10-CM | POA: Diagnosis not present

## 2020-12-25 LAB — ECHOCARDIOGRAM COMPLETE
AR max vel: 2 cm2
AV Area VTI: 1.94 cm2
AV Area mean vel: 1.97 cm2
AV Mean grad: 12 mmHg
AV Peak grad: 21.3 mmHg
Ao pk vel: 2.31 m/s
Area-P 1/2: 2.16 cm2
P 1/2 time: 423 msec
S' Lateral: 2.6 cm

## 2020-12-27 ENCOUNTER — Telehealth: Payer: Self-pay | Admitting: Cardiovascular Disease

## 2020-12-27 NOTE — Telephone Encounter (Signed)
Patient was returning call to receive her echo results. Please return call.

## 2020-12-27 NOTE — Telephone Encounter (Signed)
-----   Message from Sherren Mocha, MD sent at 12/26/2020  5:13 PM EDT ----- Stable echo findings noted.  Moderate paravalvular aortic insufficiency noted.  This is unchanged from previous.  The aortic valve gradient is unchanged.  There is moderate mitral regurgitation.  LV function is normal.  All this appears stable.  Continue current management with clinical follow-up as planned.

## 2020-12-27 NOTE — Telephone Encounter (Signed)
The patient has been notified of the result and verbalized understanding.  All questions (if any) were answered. Nuala Alpha, LPN 4/40/1027 2:53 AM

## 2021-01-10 DIAGNOSIS — M65332 Trigger finger, left middle finger: Secondary | ICD-10-CM | POA: Diagnosis not present

## 2021-01-10 DIAGNOSIS — M65331 Trigger finger, right middle finger: Secondary | ICD-10-CM | POA: Diagnosis not present

## 2021-01-11 DIAGNOSIS — Z23 Encounter for immunization: Secondary | ICD-10-CM | POA: Diagnosis not present

## 2021-01-26 ENCOUNTER — Other Ambulatory Visit: Payer: Self-pay

## 2021-01-26 ENCOUNTER — Other Ambulatory Visit (HOSPITAL_BASED_OUTPATIENT_CLINIC_OR_DEPARTMENT_OTHER): Payer: Self-pay

## 2021-01-26 ENCOUNTER — Ambulatory Visit: Payer: Medicare Other | Attending: Internal Medicine

## 2021-01-26 DIAGNOSIS — Z23 Encounter for immunization: Secondary | ICD-10-CM

## 2021-01-26 MED ORDER — PFIZER COVID-19 VAC BIVALENT 30 MCG/0.3ML IM SUSP
INTRAMUSCULAR | 0 refills | Status: DC
  Filled 2021-01-26: qty 0.3, 1d supply, fill #0

## 2021-01-26 NOTE — Progress Notes (Signed)
   Covid-19 Vaccination Clinic  Name:  Sara Garza    MRN: [id number] DOB: 1923/07/06  01/26/2021  [name] was observed post Covid-19 immunization for 15 minutes without incident. She was provided with Vaccine Information Sheet and instruction to access the V-Safe system.   [name] was instructed to call 911 with any severe reactions post vaccine: Difficulty breathing  Swelling of face and throat  A fast heartbeat  A bad rash all over body  Dizziness and weakness   Immunizations Administered     Name Date Dose VIS Date Route   Pfizer Covid-19 Vaccine Bivalent Booster 01/26/2021  3:16 PM 0.3 mL 11/29/2020 Intramuscular   Manufacturer: [place]   Lot: [id number]   [place]: [phone]

## 2021-02-02 DIAGNOSIS — H401132 Primary open-angle glaucoma, bilateral, moderate stage: Secondary | ICD-10-CM | POA: Diagnosis not present

## 2021-02-02 DIAGNOSIS — H04123 Dry eye syndrome of bilateral lacrimal glands: Secondary | ICD-10-CM | POA: Diagnosis not present

## 2021-02-02 DIAGNOSIS — H353132 Nonexudative age-related macular degeneration, bilateral, intermediate dry stage: Secondary | ICD-10-CM | POA: Diagnosis not present

## 2021-02-02 DIAGNOSIS — H5201 Hypermetropia, right eye: Secondary | ICD-10-CM | POA: Diagnosis not present

## 2021-02-08 ENCOUNTER — Other Ambulatory Visit: Payer: Self-pay | Admitting: Cardiovascular Disease

## 2021-02-09 ENCOUNTER — Ambulatory Visit: Payer: Medicare Other | Admitting: Adult Health

## 2021-02-16 ENCOUNTER — Ambulatory Visit (INDEPENDENT_AMBULATORY_CARE_PROVIDER_SITE_OTHER): Payer: Medicare Other | Admitting: Adult Health

## 2021-02-16 ENCOUNTER — Encounter: Payer: Self-pay | Admitting: Adult Health

## 2021-02-16 VITALS — BP 138/58 | HR 82 | Temp 97.7°F | Ht 60.0 in | Wt 142.0 lb

## 2021-02-16 DIAGNOSIS — E538 Deficiency of other specified B group vitamins: Secondary | ICD-10-CM | POA: Diagnosis not present

## 2021-02-16 DIAGNOSIS — I10 Essential (primary) hypertension: Secondary | ICD-10-CM

## 2021-02-16 DIAGNOSIS — G8929 Other chronic pain: Secondary | ICD-10-CM | POA: Diagnosis not present

## 2021-02-16 DIAGNOSIS — I5032 Chronic diastolic (congestive) heart failure: Secondary | ICD-10-CM | POA: Diagnosis not present

## 2021-02-16 DIAGNOSIS — M25511 Pain in right shoulder: Secondary | ICD-10-CM

## 2021-02-16 DIAGNOSIS — R5383 Other fatigue: Secondary | ICD-10-CM | POA: Diagnosis not present

## 2021-02-16 DIAGNOSIS — E1121 Type 2 diabetes mellitus with diabetic nephropathy: Secondary | ICD-10-CM | POA: Diagnosis not present

## 2021-02-16 DIAGNOSIS — I25119 Atherosclerotic heart disease of native coronary artery with unspecified angina pectoris: Secondary | ICD-10-CM | POA: Diagnosis not present

## 2021-02-16 DIAGNOSIS — E559 Vitamin D deficiency, unspecified: Secondary | ICD-10-CM

## 2021-02-16 NOTE — Progress Notes (Addendum)
Subjective:    Patient ID: Sara Garza, female    DOB: 1923/07/11, 87 y.o.   MRN: [id number]  HPI  87 year old female who  has a past medical history of Arthritis, CAD (coronary artery disease), Carotid stenosis, left, Diabetes mellitus, Glaucoma, Hyperlipidemia, Hypertension, Leg pain, Osteoporosis, and Valvular heart disease.  She presents to the office today with her daughter.  She was last seen in this office in February 2022.  Overall she has been doing well has not had any hospital admissions.    He has been seen by orthopedics for chronic right shoulder pain, has had 2 steroid injections as well as an injection with good cushioning agent.  These were not helpful, her orthopedic recommended shoulder replacement surgery.  Patient is not keen on this idea due to her age and chronic health issues.  In the past she has also tried tramadol but did not like the way it made her feel.  Currently she is using Tylenol without much relief in her pain.  He is wondering if there is anything else that she can do to help get some relief.  Additionally, she has chronic fatigue that is likely from history of CHF and chronic kidney disease.  She does have a history of diabetes that has been well controlled with diet, her last A1c was done in September 2021 and was 5.7.  She would like to have some labs done today to make sure nothing else is causing her fatigue    Review of Systems See HPI   Past Medical History:  Diagnosis Date   Arthritis    CAD (coronary artery disease)    Carotid stenosis, left    50-60% stable   Diabetes mellitus    Glaucoma    Hyperlipidemia    Hypertension    Leg pain    ABIs 2/18: normal bilaterally.   Osteoporosis    Valvular heart disease    Aortic Stenosis s/p [name] 2014 // Mitral stenosis // Echo 09/2018: EF > 65, mod LVH, severe focal basal hypertrophy, RVSP 39.8, mild to mod MR, mod to severe MS (mean 9), AVR with mild AI, severe LAE (similar to prior echo)     Social History   Socioeconomic History   Marital status: Widowed    Spouse name: Not on file   Number of children: Not on file   Years of education: Not on file   Highest education level: Not on file  Occupational History   Not on file  Tobacco Use   Smoking status: Never    Passive exposure: Yes   Smokeless tobacco: Never  Substance and Sexual Activity   Alcohol use: Yes    Alcohol/week: 0.0 standard drinks    Comment: very seldom   Drug use: No   Sexual activity: Not Currently  Other Topics Concern   Not on file  Social History Narrative   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      She likes to read and she takes classes at [employer] Strain: Not on [employer] Insecurity: Not on file  Transportation Needs: Not on file  Physical Activity: Not on file  Stress: Not on file  Social Connections: Not on file  Intimate Partner Violence: Not on file    Past Surgical History:  Procedure Laterality Date   ANOMALOUS PULMONARY VENOUS RETURN REPAIR,  TOTAL     APPENDECTOMY     CARDIAC VALVE REPLACEMENT  04/29/2012   Cataracts Bilateral    CESAREAN SECTION     FOOT SURGERY     [name]   HIP ARTHROPLASTY Right 07/31/2013   Procedure: ARTHROPLASTY BIPOLAR HIP;  Surgeon: [name], MD;  Location: [facility];  Service: Orthopedics;  Laterality: Right;   INTRAMEDULLARY (IM) NAIL INTERTROCHANTERIC Left 01/01/2020   Procedure: INTRAMEDULLARY (IM) NAIL INTERTROCHANTRIC;  Surgeon: Rod Can, MD;  Location: [facility];  Service: Orthopedics;  Laterality: Left;   PERCUTANEOUS CORONARY STENT INTERVENTION (PCI-S) N/A 01/02/2012   Procedure: PERCUTANEOUS CORONARY STENT INTERVENTION (PCI-S);  Surgeon: Sherren Mocha, MD;  Location: [facility] CATH LAB;  Service: Cardiovascular;  Laterality: N/A;    Family History  Problem Relation Age of Onset   COPD Father    Cancer Father        lung cancer    Lung cancer Other     Allergies  Allergen Reactions   Statins Other (See Comments)    Muscle aches   Gabapentin Other (See Comments)    SOMNOLENCE    Current Outpatient Medications on File Prior to Visit  Medication Sig Dispense Refill   acetaminophen (TYLENOL) 500 MG tablet Take 2 tablets (1,000 mg total) by mouth 4 (four) times daily. 30 tablet 0   amLODipine (NORVASC) 5 MG tablet Take 1 tablet (5 mg total) by mouth daily. 90 tablet 3   amoxicillin (AMOXIL) 500 MG capsule TAKE 4 CAPSULES BY MOUTH ONE HOUR PRIOR TO DENTAL PROCEDURES 12 capsule 2   aspirin EC 81 MG tablet Take 81 mg by mouth daily. Swallow whole.     bimatoprost (LUMIGAN) 0.01 % SOLN Place 1 drop into both eyes at bedtime.      Calcium Carb-Cholecalciferol 500-400 MG-UNIT TABS Take 1 tablet by mouth daily.     carvedilol (COREG) 3.125 MG tablet Take 1 tablet (3.125 mg total) by mouth 2 (two) times daily. 180 tablet 3   COVID-19 mRNA bivalent vaccine, Pfizer, (PFIZER COVID-19 VAC BIVALENT) injection Inject into the muscle. 0.3 mL 0   ezetimibe (ZETIA) 10 MG tablet TAKE 1 TABLET(10 MG) BY MOUTH DAILY 30 tablet 3   furosemide (LASIX) 40 MG tablet Take 40 mg by mouth every other day.     Multiple Vitamin (MULTIVITAMIN) tablet Take 1 tablet by mouth daily.     Multiple Vitamins-Minerals (PRESERVISION AREDS 2+MULTI VIT PO) Take 2 capsules by mouth in the morning and at bedtime.     No current facility-administered medications on file prior to visit.    BP (!) 138/58   Pulse 82   Temp 97.7 F (36.5 C) (Oral)   Ht 5' (1.524 m)   Wt 142 lb (64.4 kg)   SpO2 96%   BMI 27.73 kg/m       Objective:   Physical Exam Vitals and nursing note reviewed.  Constitutional:      Appearance: Normal appearance.  Cardiovascular:     Rate and Rhythm: Normal rate and regular rhythm.     Pulses: Normal pulses.     Heart sounds: Normal heart sounds.  Pulmonary:     Effort: Pulmonary effort is normal.     Breath sounds: Normal  breath sounds.  Musculoskeletal:        General: Tenderness present.     Right shoulder: Tenderness and bony tenderness present. Decreased range of motion. Decreased strength.     Right lower leg: No edema.     Left  lower leg: No edema.  Skin:    General: Skin is warm and dry.  Neurological:     General: No focal deficit present.     Mental Status: She is alert and oriented to person, place, and time.     Gait: Gait abnormal (walks with a rolling walker).  Psychiatric:        Mood and Affect: Mood normal.        Behavior: Behavior normal.        Thought Content: Thought content normal.        Judgment: Judgment normal.      Assessment & Plan:  1. Chronic right shoulder pain chronic right shoulder pain secondary to osteoarthritis of right shoulder - Ambulatory referral to Home Health  2. Essential hypertension  - Iron and TIBC; Future - TSH; Future - Vitamin B12; Future - Vitamin D, 25-hydroxy; Future - CBC with Differential/Platelet; Future - Hemoglobin A1c; Future - Basic Metabolic Panel; Future - Iron and TIBC - Basic Metabolic Panel - Hemoglobin A1c - CBC with Differential/Platelet - Vitamin D, 25-hydroxy - Vitamin B12 - TSH  3. Chronic diastolic CHF (congestive heart failure) (HCC)  - Iron and TIBC; Future - TSH; Future - Vitamin B12; Future - Vitamin D, 25-hydroxy; Future - CBC with Differential/Platelet; Future - Hemoglobin A1c; Future - Basic Metabolic Panel; Future - Iron and TIBC - Basic Metabolic Panel - Hemoglobin A1c - CBC with Differential/Platelet - Vitamin D, 25-hydroxy - Vitamin B12 - TSH  4. Controlled type 2 diabetes mellitus with diabetic nephropathy, without long-term current use of insulin (HCC) - Consider Metformin  - Iron and TIBC; Future - TSH; Future - Vitamin B12; Future - Vitamin D, 25-hydroxy; Future - CBC with Differential/Platelet; Future - Hemoglobin A1c; Future - Basic Metabolic Panel; Future - Iron and TIBC - Basic  Metabolic Panel - Hemoglobin A1c - CBC with Differential/Platelet - Vitamin D, 25-hydroxy - Vitamin B12 - TSH  5. Vitamin D deficiency  - Vitamin D, 25-hydroxy; Future - Vitamin D, 25-hydroxy  6. Other fatigue - likely due to age, cardiac and renal decline  - Iron and TIBC; Future - TSH; Future - Vitamin B12; Future - Vitamin D, 25-hydroxy; Future - CBC with Differential/Platelet; Future - Hemoglobin A1c; Future - Basic Metabolic Panel; Future - Iron and TIBC - Basic Metabolic Panel - Hemoglobin A1c - CBC with Differential/Platelet - Vitamin D, 25-hydroxy - Vitamin B12 - TSH  7. Vitamin B12 deficiency  - Vitamin B12; Future - Vitamin B12  [name], NP

## 2021-02-17 LAB — CBC WITH DIFFERENTIAL/PLATELET
Absolute Monocytes: 770 cells/uL (ref 200–950)
Basophils Absolute: 39 cells/uL (ref 0–200)
Basophils Relative: 0.5 %
Eosinophils Absolute: 239 cells/uL (ref 15–500)
Eosinophils Relative: 3.1 %
HCT: 35.2 % (ref 35.0–45.0)
Hemoglobin: 12 g/dL (ref 11.7–15.5)
Lymphs Abs: 1301 cells/uL (ref 850–3900)
MCH: 31.7 pg (ref 27.0–33.0)
MCHC: 34.1 g/dL (ref 32.0–36.0)
MCV: 93.1 fL (ref 80.0–100.0)
MPV: 11.8 fL (ref 7.5–12.5)
Monocytes Relative: 10 %
Neutro Abs: 5352 cells/uL (ref 1500–7800)
Neutrophils Relative %: 69.5 %
Platelets: 234 10*3/uL (ref 140–400)
RBC: 3.78 10*6/uL — ABNORMAL LOW (ref 3.80–5.10)
RDW: 12.6 % (ref 11.0–15.0)
Total Lymphocyte: 16.9 %
WBC: 7.7 10*3/uL (ref 3.8–10.8)

## 2021-02-17 LAB — HEMOGLOBIN A1C
Hgb A1c MFr Bld: 5.9 % of total Hgb — ABNORMAL HIGH (ref <5.7)
Mean Plasma Glucose: 123 mg/dL
eAG (mmol/L): 6.8 mmol/L

## 2021-02-17 LAB — BASIC METABOLIC PANEL
BUN/Creatinine Ratio: 26 (calc) — ABNORMAL HIGH (ref 6–22)
BUN: 43 mg/dL — ABNORMAL HIGH (ref 7–25)
CO2: 27 mmol/L (ref 20–32)
Calcium: 9.7 mg/dL (ref 8.6–10.4)
Chloride: 106 mmol/L (ref 98–110)
Creat: 1.63 mg/dL — ABNORMAL HIGH (ref 0.60–0.95)
Glucose, Bld: 85 mg/dL (ref 65–99)
Potassium: 4.6 mmol/L (ref 3.5–5.3)
Sodium: 143 mmol/L (ref 135–146)

## 2021-02-17 LAB — IRON AND TIBC
Iron Saturation: 19 % (ref 15–55)
Iron: 46 ug/dL (ref 27–139)
Total Iron Binding Capacity: 244 ug/dL — ABNORMAL LOW (ref 250–450)
UIBC: 198 ug/dL (ref 118–369)

## 2021-02-17 LAB — VITAMIN B12: Vitamin B-12: 608 pg/mL (ref 200–1100)

## 2021-02-17 LAB — VITAMIN D 25 HYDROXY (VIT D DEFICIENCY, FRACTURES): Vit D, 25-Hydroxy: 37 ng/mL (ref 30–100)

## 2021-02-17 LAB — TSH: TSH: 1.42 mIU/L (ref 0.40–4.50)

## 2021-02-20 ENCOUNTER — Telehealth: Payer: Self-pay | Admitting: Adult Health

## 2021-02-20 NOTE — Telephone Encounter (Signed)
Pt is returning a call and want a call back about her lab result.

## 2021-02-25 DIAGNOSIS — M5136 Other intervertebral disc degeneration, lumbar region: Secondary | ICD-10-CM | POA: Diagnosis not present

## 2021-02-25 DIAGNOSIS — E559 Vitamin D deficiency, unspecified: Secondary | ICD-10-CM | POA: Diagnosis not present

## 2021-02-25 DIAGNOSIS — N183 Chronic kidney disease, stage 3 unspecified: Secondary | ICD-10-CM | POA: Diagnosis not present

## 2021-02-25 DIAGNOSIS — M19011 Primary osteoarthritis, right shoulder: Secondary | ICD-10-CM | POA: Diagnosis not present

## 2021-02-25 DIAGNOSIS — D631 Anemia in chronic kidney disease: Secondary | ICD-10-CM | POA: Diagnosis not present

## 2021-02-25 DIAGNOSIS — Z7982 Long term (current) use of aspirin: Secondary | ICD-10-CM | POA: Diagnosis not present

## 2021-02-25 DIAGNOSIS — M4726 Other spondylosis with radiculopathy, lumbar region: Secondary | ICD-10-CM | POA: Diagnosis not present

## 2021-02-25 DIAGNOSIS — R32 Unspecified urinary incontinence: Secondary | ICD-10-CM | POA: Diagnosis not present

## 2021-02-25 DIAGNOSIS — I5032 Chronic diastolic (congestive) heart failure: Secondary | ICD-10-CM | POA: Diagnosis not present

## 2021-02-25 DIAGNOSIS — M4316 Spondylolisthesis, lumbar region: Secondary | ICD-10-CM | POA: Diagnosis not present

## 2021-02-25 DIAGNOSIS — M48061 Spinal stenosis, lumbar region without neurogenic claudication: Secondary | ICD-10-CM | POA: Diagnosis not present

## 2021-02-25 DIAGNOSIS — E7800 Pure hypercholesterolemia, unspecified: Secondary | ICD-10-CM | POA: Diagnosis not present

## 2021-02-25 DIAGNOSIS — M810 Age-related osteoporosis without current pathological fracture: Secondary | ICD-10-CM | POA: Diagnosis not present

## 2021-02-25 DIAGNOSIS — I6522 Occlusion and stenosis of left carotid artery: Secondary | ICD-10-CM | POA: Diagnosis not present

## 2021-02-25 DIAGNOSIS — I130 Hypertensive heart and chronic kidney disease with heart failure and stage 1 through stage 4 chronic kidney disease, or unspecified chronic kidney disease: Secondary | ICD-10-CM | POA: Diagnosis not present

## 2021-02-25 DIAGNOSIS — E538 Deficiency of other specified B group vitamins: Secondary | ICD-10-CM | POA: Diagnosis not present

## 2021-02-25 DIAGNOSIS — H409 Unspecified glaucoma: Secondary | ICD-10-CM | POA: Diagnosis not present

## 2021-02-25 DIAGNOSIS — E1122 Type 2 diabetes mellitus with diabetic chronic kidney disease: Secondary | ICD-10-CM | POA: Diagnosis not present

## 2021-02-25 DIAGNOSIS — E1142 Type 2 diabetes mellitus with diabetic polyneuropathy: Secondary | ICD-10-CM | POA: Diagnosis not present

## 2021-02-25 DIAGNOSIS — I2510 Atherosclerotic heart disease of native coronary artery without angina pectoris: Secondary | ICD-10-CM | POA: Diagnosis not present

## 2021-02-25 DIAGNOSIS — I38 Endocarditis, valve unspecified: Secondary | ICD-10-CM | POA: Diagnosis not present

## 2021-02-25 DIAGNOSIS — I447 Left bundle-branch block, unspecified: Secondary | ICD-10-CM | POA: Diagnosis not present

## 2021-03-01 ENCOUNTER — Telehealth: Payer: Self-pay | Admitting: Adult Health

## 2021-03-01 DIAGNOSIS — I130 Hypertensive heart and chronic kidney disease with heart failure and stage 1 through stage 4 chronic kidney disease, or unspecified chronic kidney disease: Secondary | ICD-10-CM | POA: Diagnosis not present

## 2021-03-01 DIAGNOSIS — M19011 Primary osteoarthritis, right shoulder: Secondary | ICD-10-CM | POA: Diagnosis not present

## 2021-03-01 DIAGNOSIS — N183 Chronic kidney disease, stage 3 unspecified: Secondary | ICD-10-CM | POA: Diagnosis not present

## 2021-03-01 DIAGNOSIS — D631 Anemia in chronic kidney disease: Secondary | ICD-10-CM | POA: Diagnosis not present

## 2021-03-01 DIAGNOSIS — I5032 Chronic diastolic (congestive) heart failure: Secondary | ICD-10-CM | POA: Diagnosis not present

## 2021-03-01 DIAGNOSIS — E1122 Type 2 diabetes mellitus with diabetic chronic kidney disease: Secondary | ICD-10-CM | POA: Diagnosis not present

## 2021-03-01 NOTE — Telephone Encounter (Signed)
Pt call and stated she want [name] to sent her a copy of her labs report to her.

## 2021-03-01 NOTE — Telephone Encounter (Signed)
Sara Garza Trinna Post with [facility] is calling and the office visit from 02-16-2021 the chronic right shoulder pain needs to be link to etiology of  osteoarthritis and refax to attn Sara Garza [phone]

## 2021-03-05 DIAGNOSIS — I5032 Chronic diastolic (congestive) heart failure: Secondary | ICD-10-CM | POA: Diagnosis not present

## 2021-03-05 DIAGNOSIS — E1122 Type 2 diabetes mellitus with diabetic chronic kidney disease: Secondary | ICD-10-CM | POA: Diagnosis not present

## 2021-03-05 DIAGNOSIS — M19011 Primary osteoarthritis, right shoulder: Secondary | ICD-10-CM | POA: Diagnosis not present

## 2021-03-05 DIAGNOSIS — N183 Chronic kidney disease, stage 3 unspecified: Secondary | ICD-10-CM | POA: Diagnosis not present

## 2021-03-05 DIAGNOSIS — D631 Anemia in chronic kidney disease: Secondary | ICD-10-CM | POA: Diagnosis not present

## 2021-03-05 DIAGNOSIS — I130 Hypertensive heart and chronic kidney disease with heart failure and stage 1 through stage 4 chronic kidney disease, or unspecified chronic kidney disease: Secondary | ICD-10-CM | POA: Diagnosis not present

## 2021-03-06 ENCOUNTER — Telehealth: Payer: Self-pay | Admitting: Pharmacist

## 2021-03-06 NOTE — Chronic Care Management (AMB) (Signed)
    Chronic Care Management Pharmacy Assistant   Name: Sara Garza  MRN: [id number] DOB: 09/05/1923  03/06/21 APPOINTMENT REMINDER  Patient  was reminded to have all medications, supplements and any blood glucose and blood pressure readings available for review with [name], Pharm. D, for telephone visit on 03/07/21 at 12.    Care Gaps: Zoster Vaccine - Overdue Urine Micro - Overdue Foot Exam - Done 09-12-2020 COVID Booster #4 (Pfizer) -Overdue HGB A1C - Overdue Flu Vaccine - Overdue  CCM - 03-07-21 AWV - 2019- MSG sent to Ramond Craver CMA to schedule.  Star Rating Drug: None  Any gaps in medications fill history? None    Medications: Outpatient Encounter Medications as of 03/06/2021  Medication Sig   acetaminophen (TYLENOL) 500 MG tablet Take 2 tablets (1,000 mg total) by mouth 4 (four) times daily.   amLODipine (NORVASC) 5 MG tablet Take 1 tablet (5 mg total) by mouth daily.   amoxicillin (AMOXIL) 500 MG capsule TAKE 4 CAPSULES BY MOUTH ONE HOUR PRIOR TO DENTAL PROCEDURES   aspirin EC 81 MG tablet Take 81 mg by mouth daily. Swallow whole.   bimatoprost (LUMIGAN) 0.01 % SOLN Place 1 drop into both eyes at bedtime.    Calcium Carb-Cholecalciferol 500-400 MG-UNIT TABS Take 1 tablet by mouth daily.   carvedilol (COREG) 3.125 MG tablet Take 1 tablet (3.125 mg total) by mouth 2 (two) times daily.   COVID-19 mRNA bivalent vaccine, Pfizer, (PFIZER COVID-19 VAC BIVALENT) injection Inject into the muscle.   ezetimibe (ZETIA) 10 MG tablet TAKE 1 TABLET(10 MG) BY MOUTH DAILY   furosemide (LASIX) 40 MG tablet Take 40 mg by mouth every other day.   Multiple Vitamin (MULTIVITAMIN) tablet Take 1 tablet by mouth daily.   Multiple Vitamins-Minerals (PRESERVISION AREDS 2+MULTI VIT PO) Take 2 capsules by mouth in the morning and at bedtime.   No facility-administered encounter medications on file as of 03/06/2021.    Patient Assistance: Lumbigain -2022  Patient received did not wish  to proceed   [place] Clinical Pharmacist Assistant [phone]

## 2021-03-07 ENCOUNTER — Ambulatory Visit: Payer: Medicare Other | Admitting: Pharmacist

## 2021-03-07 DIAGNOSIS — I10 Essential (primary) hypertension: Secondary | ICD-10-CM

## 2021-03-07 DIAGNOSIS — I5032 Chronic diastolic (congestive) heart failure: Secondary | ICD-10-CM

## 2021-03-07 NOTE — Progress Notes (Signed)
Chronic Care Management Pharmacy Note  03/07/2021 Name:  Sara Garza MRN:  [id number] DOB:  January 27, 1924  Summary: BP is mostly at goal < 140/90 per home BP readings  Recommendations/Changes made from today's visit: -Recommended increasing dietary calcium to include 1 source of dairy daily -Recommended continued monitoring of blood pressure and daily weights at home -Recommend repeat lipid panel -Trial of Turmeric for shoulder pain   Plan: -Follow up BP assessment in 4 months   Subjective: Sara Garza is an 87 y.o. year old female who is a primary patient of [name], NP.  The CCM team was consulted for assistance with disease management and care coordination needs.    Engaged with patient face to face for follow up visit in response to provider referral for pharmacy case management and/or care coordination services.   Consent to Services:  The patient was given information about Chronic Care Management services, agreed to services, and gave verbal consent prior to initiation of services.  Please see initial visit note for detailed documentation.   Patient Care Team: [name], NP as PCP - General (Family Medicine) Sherren Mocha, MD as PCP - Cardiology (Cardiology) Sherren Mocha, MD (Cardiology) Suella Broad, MD as Consulting Physician (Physical Medicine and Rehabilitation) Irine Seal, MD as Attending Physician (Urology) Viona Gilmore, [facility] as Pharmacist (Pharmacist)  Recent office visits: 02/16/21  [name], NP: Patient presented for shoulder pain. Referred to home health. A1c increased slightly to 5.9%. Iron slightly low.  Recent consult visits: 01/10/21 Orene Desanctis (ortho): Patient presented for trigger finger follow up. Unable to access notes.  09-12-2020 Tobi Bastos (Orthopedic Surgery) - Patient presented for right shoulder pain. No medication changes.    09-08-2020 Felipa Furnace, DPM (Podiatry) - Patient presented for Pain due to  onychomycosis of toenails of both feet. No medication changes.  07/31/20 Sherren Mocha, MD (cardiology): Patient presented for CHF follow up. Continued furosemide every other day. Repeat BMET completed.   Hospital visits: 02/14/20 - 02/17/20 Patient admitted for CHF exacerbation.  12/30/19-01/04/20 Patient admitted for femur fracture.  Objective:  Lab Results  Component Value Date   CREATININE 1.63 (H) 02/16/2021   BUN 43 (H) 02/16/2021   GFR 23.61 (L) 04/05/2020   GFRNONAA 23 (L) 03/07/2020   GFRAA 27 (L) 03/07/2020   NA 143 02/16/2021   K 4.6 02/16/2021   CALCIUM 9.7 02/16/2021   CO2 27 02/16/2021    Lab Results  Component Value Date/Time   HGBA1C 5.9 (H) 02/16/2021 03:52 PM   HGBA1C 5.7 (H) 12/01/2019 10:14 AM   GFR 23.61 (L) 04/05/2020 01:10 PM   GFR 36.25 (L) 11/26/2018 12:01 PM   MICROALBUR 3.1 (H) 06/18/2016 09:45 AM    Last diabetic Eye exam:  Lab Results  Component Value Date/Time   HMDIABEYEEXA No Retinopathy 07/19/2020 12:00 AM    Last diabetic Foot exam:  Lab Results  Component Value Date/Time   HMDIABFOOTEX done 04/16/2010 12:00 AM     Lab Results  Component Value Date   CHOL 203 (H) 12/01/2019   HDL 42 (L) 12/01/2019   LDLCALC 133 (H) 12/01/2019   LDLDIRECT 143.0 11/26/2018   TRIG 149 12/01/2019   CHOLHDL 4.8 12/01/2019    Hepatic Function Latest Ref Rng & Units 04/05/2020 02/14/2020 12/01/2019  Total Protein 6.0 - 8.3 g/dL 7.0 7.8 6.8  Albumin 3.5 - 5.2 g/dL 3.9 3.6 -  AST 0 - 37 U/L _0 ALT 0 - 35 U/L 12 13  9  Alk Phosphatase 39 - 117 U/L 105 88 -  Total Bilirubin 0.2 - 1.2 mg/dL 0.4 0.8 0.6  Bilirubin, Direct 0.0 - 0.3 mg/dL - - -    Lab Results  Component Value Date/Time   TSH 1.42 02/16/2021 03:52 PM   TSH 1.60 12/01/2019 10:14 AM    CBC Latest Ref Rng & Units 02/16/2021 02/17/2020 02/16/2020  WBC 3.8 - 10.8 Thousand/uL 7.7 4.6 5.9  Hemoglobin 11.7 - 15.5 g/dL 12.0 10.3(L) 10.3(L)  Hematocrit 35.0 - 45.0 % 35.2 31.1(L) 32.6(L)   Platelets 140 - 400 Thousand/uL 234 PLATELET CLUMPS NOTED ON SMEAR, UNABLE TO ESTIMATE 239    Lab Results  Component Value Date/Time   VD25OH 37 02/16/2021 03:52 PM   VD25OH 37.79 04/29/2017 05:23 PM   VD25OH 38 07/31/2013 10:10 AM    Clinical ASCVD: Yes  The ASCVD Risk score (Arnett DK, et al., 2019) failed to calculate for the following reasons:   The 2019 ASCVD risk score is only valid for ages 55 to 1    Depression screen PHQ 2/9 02/16/2021 12/01/2019 12/08/2017  Decreased Interest 0 0 0  Down, Depressed, Hopeless 0 0 0  PHQ - 2 Score 0 0 0  Some recent data might be hidden      Social History   Tobacco Use  Smoking Status Never   Passive exposure: Yes  Smokeless Tobacco Never   BP Readings from Last 3 Encounters:  02/16/21 (!) 138/58  07/31/20 (!) 132/57  05/12/20 136/70   Pulse Readings from Last 3 Encounters:  02/16/21 82  07/31/20 79  04/21/20 79   Wt Readings from Last 3 Encounters:  02/16/21 142 lb (64.4 kg)  07/31/20 145 lb 12.8 oz (66.1 kg)  05/12/20 147 lb (66.7 kg)    Assessment/Interventions: Review of patient past medical history, allergies, medications, health status, including review of consultants reports, laboratory and other test data, was performed as part of comprehensive evaluation and provision of chronic care management services.   SDOH:  (Social Determinants of Health) assessments and interventions performed: No   CCM Care Plan  Allergies  Allergen Reactions   Statins Other (See Comments)    Muscle aches   Gabapentin Other (See Comments)    SOMNOLENCE    Medications Reviewed Today     Reviewed by [name], NP (Nurse Practitioner) on 02/16/21 at 1634  Med List Status: <None>   Medication Order Taking? Sig Documenting Provider Last Dose Status Informant  acetaminophen (TYLENOL) 500 MG tablet [id number] Yes Take 2 tablets (1,000 mg total) by mouth 4 (four) times daily. Dwyane Dee, MD Taking Active Child  amLODipine  (NORVASC) 5 MG tablet [id number] Yes Take 1 tablet (5 mg total) by mouth daily. Sherren Mocha, MD Taking Active   amoxicillin (AMOXIL) 500 MG capsule [id number] Yes TAKE 4 CAPSULES BY MOUTH ONE HOUR PRIOR TO DENTAL PROCEDURES Sherren Mocha, MD Taking Active   aspirin EC 81 MG tablet [id number] Yes Take 81 mg by mouth daily. Swallow whole. [provider] Taking Active   bimatoprost (LUMIGAN) 0.01 % SOLN [id number] Yes Place 1 drop into both eyes at bedtime.  [provider] Taking Active Child  Calcium Carb-Cholecalciferol 500-400 MG-UNIT TABS [id number] Yes Take 1 tablet by mouth daily. [provider] Taking Active Child  carvedilol (COREG) 3.125 MG tablet [id number] Yes Take 1 tablet (3.125 mg total) by mouth 2 (two) times daily. Sherren Mocha, MD Taking Active   COVID-19 mRNA bivalent vaccine, Pfizer, (PFIZER COVID-19 VAC BIVALENT)  injection [id number] Yes Inject into the muscle. Carlyle Basques, MD Taking Active   ezetimibe (ZETIA) 10 MG tablet [id number] Yes TAKE 1 TABLET(10 MG) BY MOUTH DAILY Sherren Mocha, MD Taking Active   furosemide (LASIX) 40 MG tablet [id number] Yes Take 40 mg by mouth every other day. [provider] Taking Active   Multiple Vitamin (MULTIVITAMIN) tablet [id number] Yes Take 1 tablet by mouth daily. [provider] Taking Active Child  Multiple Vitamins-Minerals (PRESERVISION AREDS 2+MULTI VIT PO) [id number] Yes Take 2 capsules by mouth in the morning and at bedtime. [provider] Taking Active Child            Patient Active Problem List   Diagnosis Date Noted   Acute CHF (congestive heart failure) ([place]) 02/14/2020   Acute blood loss anemia 01/03/2020   Eye irritation 01/01/2020   Mitral valve stenosis    Benign essential HTN    Chronic diastolic CHF (congestive heart failure) ([place])    Hip fracture ([place]) 12/30/2019   Closed fracture of femur, intertrochanteric, left, initial encounter ([place])    Acute on  chronic diastolic CHF (congestive heart failure) ([place]) 02/25/2018   S/P [name] (transcatheter aortic valve replacement) 02/25/2018   Leg pain    Hyperlipidemia    Glaucoma    Carotid stenosis, left    Arthritis    Urinary incontinence 02/12/2017   Chronic bilateral low back pain 08/12/2016   Spondylosis of lumbar joint 08/12/2016   Bilateral leg pain 08/12/2016   Neck mass 04/15/2015   Status post hip hemiarthroplasty 07/31/2013   CAD (coronary artery disease) 07/31/2013   CKD (chronic kidney disease), stage III ([place]) 07/31/2013   Left bundle branch block 07/31/2013   Severe calcific aortic stenosis 06/18/2010   Diabetic polyneuropathy ([place]) 04/16/2010   GLAUCOMA 01/06/2009   CHEST PAIN, ATYPICAL 12/16/2008   Bilateral shoulder pain 11/16/2007   OSTEOARTHRITIS 02/02/2007   Type 2 diabetes mellitus with renal manifestations, controlled ([place]) 10/24/2006   Dyslipidemia 10/02/2006   Essential hypertension 10/02/2006    Immunization History  Administered Date(s) Administered   Hepatitis A 10/09/2005   Influenza Split 01/02/2011, 12/24/2011   Influenza Whole 04/01/2005, 01/01/2007, 01/02/2008, 01/06/2009, 01/17/2010   Influenza, High Dose Seasonal PF 01/04/2014, 01/06/2015, 01/09/2016, 01/07/2017, 12/25/2017   Influenza,inj,Quad PF,6+ Mos 12/25/2012   Influenza-Unspecified 01/04/2020   PFIZER(Purple Top)SARS-COV-2 Vaccination 04/02/2019, 04/29/2019, 12/30/2019   Pfizer Covid-19 Vaccine Bivalent Booster 62yr & up 01/26/2021   Pneumococcal Conjugate-13 02/22/2013   Pneumococcal Polysaccharide-23 04/01/2005   Td 04/01/2000   Tetanus 07/16/2013   Zoster, Live 10/09/2005   Patient reports she is always tired but attributes that to age. She does sleep well at night and is unsure if she snores but it doesn't wake her up. She cannot use compression stockings as she cannot put them on and doesn't have help to do it but uses tighter socks.  Patient does report shoulder pain that bothers  her. She tries to use Salon pas when she can but has trouble putting on the patches. She alternates this with Voltaren gel.  Patient inquired about taking Turmeric and any benefit. Discussed pros and cons and patient is going to trial Turmeric to see if this helps for a couple of months.  Conditions to be addressed/monitored:  Hypertension, Hyperlipidemia, Diabetes, Heart Failure, Coronary Artery Disease, Chronic Kidney Disease and Osteoarthritis  Conditions addressed this visit: Hypertension, HF  Care Plan : [place] Updates made by PViona Gilmore [place] 03/07/2021 12:00 AM  Problem: Problem: Hypertension, Hyperlipidemia, Diabetes, Heart Failure, Coronary Artery Disease, Chronic Kidney Disease and Osteoarthritis      Long-Range Goal: Patient-Specific Goal   Start Date: 06/01/2020  Expected End Date: 06/01/2021  Recent Progress: On track  Priority: High  Note:   Current Barriers:  Unable to independently monitor therapeutic efficacy  Pharmacist Clinical Goal(s):  Patient will maintain control of blood pressure as evidenced by home blood pressure monitoring  through collaboration with PharmD and provider.   Interventions: 1:1 collaboration with [name], NP regarding development and update of comprehensive plan of care as evidenced by provider attestation and co-signature Inter-disciplinary care team collaboration (see longitudinal plan of care) Comprehensive medication review performed; medication list updated in electronic medical record  Hypertension (BP goal <140/90) -Controlled -Current treatment: Amlodipine 5 mg 1 tablet daily - in AM Carvedilol 3.125 mg twice daily  Furosemide 40 mg every other day -Medications previously tried: valsartan, HCTZ, losartan, isosorbide -Current home readings: 140/60-70s  (checking periodically) -Current dietary habits: did not discuss -Current exercise habits: did not discuss -Denies hypotensive/hypertensive  symptoms -Educated on Importance of home blood pressure monitoring; Proper BP monitoring technique; -Counseled to monitor BP at home a few times weekly, document, and provide log at future appointments -Recommended to continue current medication  Hyperlipidemia: (LDL goal < 100) -Uncontrolled -Current treatment: Ezetimibe 10 mg 1 tablet daily Aspirin 81 mg 1 tablet daily -Medications previously tried: statins -Current dietary patterns: did not discuss -Current exercise habits: did not discuss -Educated on Cholesterol goals;  Importance of limiting foods high in cholesterol; -Recommended to continue current medication Recommended repeat lipid panel  Diabetes (A1c goal <7%) -Controlled -Current medications: No medications -Medications previously tried: metformin  -Current home glucose readings: does not check -Denies hypoglycemic/hyperglycemic symptoms -Current meal patterns:  breakfast: n/a  lunch: n/a  dinner: n/a snacks: n/a drinks: n/a -Current exercise: did not discuss -Educated on Carbohydrate counting and/or plate method -Counseled to check feet daily and get yearly eye exams -Counseled on diet and exercise extensively  Heart Failure (Goal: manage symptoms and prevent exacerbations) -Controlled -Last ejection fraction: 65-70% (Date: 10/14/19) -HF type: Diastolic -NYHA Class: II (slight limitation of activity) -Current treatment: Carvedilol 3.125 mg twice daily  Furosemide 40 mg every other daily  -Medications previously tried: losartan (AKI) -Current dietary habits: doesn't use salt and recommended lower sodium salt; patient doesn't use prepackaged food and rarely has canned foods -Current exercise habits: did not discuss -Educated on Importance of weighing daily; if you gain more than 3 pounds in one day or 5 pounds in one week, contact cardiologist -Recommended to continue current medication   Overactive bladder (Goal: minimize symptoms) -Controlled -Current  treatment  No medications -Medications previously tried: Myrbetriq  -Recommended to continue without medications  Health Maintenance -Vaccine gaps: none -Current therapy:  Amoxicillin 500 mg 4 caps PRN dental procedures  Calcium Carbonate + Vit D 600 mg - 400 units 1 tablet daily  Multivitamin daily  Lumigan ($100+ in donut hole) - patient stated challenges affording Lumigan. She was given new eye drops by her ophthalmologist but cannot remember which one - temporarily off of it Preservision AREDS 2 two capsules twice daily -Educated on Cost vs benefit of each product must be carefully weighed by individual consumer -Patient is satisfied with current therapy and denies issues -Educated on body can only absorb 600 mg of calcium at one time and recommended separating to twice daily administration Assessed patient finances. Plan to apply for patient assistance for Lumigan.  Patient Goals/Self-Care Activities Patient will:  - take medications as prescribed check blood pressure twice weekly, document, and provide at future appointments  Follow Up Plan: Telephone follow up appointment with care management team member scheduled for: 1 year        Medication Assistance: None required.  Patient affirms current coverage meets needs.   Compliance/Adherence/Medication fill history: Care Gaps: Shingrix, urine microalbumin, foot exam, A1c, COVID booster Last BP: 138/58  Star-Rating Drugs: None  Patient's preferred pharmacy is:  [facility] [place], [place] - [address] AT [address] [place] [address] Phone: [phone] Fax: [phone]  [facility], [place] [address] AT [facility] OF [address] [place] [address] Phone: [phone] Fax: [place], [place] - [address] [place] [address] Phone: [phone] Fax: [phone]  Uses pill box? Yes Pt endorses 100% compliance  We discussed: Current pharmacy is preferred with insurance plan and patient is satisfied with pharmacy services Patient decided to: Continue current medication management strategy  Follow Up:  Patient agrees to Care Plan and Follow-up.  Plan: Telephone follow up appointment with care management team member scheduled for:  1 year  [name], PharmD [place] Pharmacist [employer] at [place] [phone]

## 2021-03-09 DIAGNOSIS — I5032 Chronic diastolic (congestive) heart failure: Secondary | ICD-10-CM | POA: Diagnosis not present

## 2021-03-09 DIAGNOSIS — N183 Chronic kidney disease, stage 3 unspecified: Secondary | ICD-10-CM | POA: Diagnosis not present

## 2021-03-09 DIAGNOSIS — E1122 Type 2 diabetes mellitus with diabetic chronic kidney disease: Secondary | ICD-10-CM | POA: Diagnosis not present

## 2021-03-09 DIAGNOSIS — M19011 Primary osteoarthritis, right shoulder: Secondary | ICD-10-CM | POA: Diagnosis not present

## 2021-03-09 DIAGNOSIS — D631 Anemia in chronic kidney disease: Secondary | ICD-10-CM | POA: Diagnosis not present

## 2021-03-09 DIAGNOSIS — I130 Hypertensive heart and chronic kidney disease with heart failure and stage 1 through stage 4 chronic kidney disease, or unspecified chronic kidney disease: Secondary | ICD-10-CM | POA: Diagnosis not present

## 2021-03-12 ENCOUNTER — Other Ambulatory Visit: Payer: Self-pay | Admitting: Cardiovascular Disease

## 2021-03-12 DIAGNOSIS — N183 Chronic kidney disease, stage 3 unspecified: Secondary | ICD-10-CM | POA: Diagnosis not present

## 2021-03-12 DIAGNOSIS — I130 Hypertensive heart and chronic kidney disease with heart failure and stage 1 through stage 4 chronic kidney disease, or unspecified chronic kidney disease: Secondary | ICD-10-CM | POA: Diagnosis not present

## 2021-03-12 DIAGNOSIS — E1122 Type 2 diabetes mellitus with diabetic chronic kidney disease: Secondary | ICD-10-CM | POA: Diagnosis not present

## 2021-03-12 DIAGNOSIS — I10 Essential (primary) hypertension: Secondary | ICD-10-CM

## 2021-03-12 DIAGNOSIS — I5032 Chronic diastolic (congestive) heart failure: Secondary | ICD-10-CM | POA: Diagnosis not present

## 2021-03-12 DIAGNOSIS — M19011 Primary osteoarthritis, right shoulder: Secondary | ICD-10-CM | POA: Diagnosis not present

## 2021-03-12 DIAGNOSIS — I359 Nonrheumatic aortic valve disorder, unspecified: Secondary | ICD-10-CM

## 2021-03-12 DIAGNOSIS — I25119 Atherosclerotic heart disease of native coronary artery with unspecified angina pectoris: Secondary | ICD-10-CM

## 2021-03-12 DIAGNOSIS — D631 Anemia in chronic kidney disease: Secondary | ICD-10-CM | POA: Diagnosis not present

## 2021-03-14 DIAGNOSIS — N183 Chronic kidney disease, stage 3 unspecified: Secondary | ICD-10-CM | POA: Diagnosis not present

## 2021-03-14 DIAGNOSIS — I130 Hypertensive heart and chronic kidney disease with heart failure and stage 1 through stage 4 chronic kidney disease, or unspecified chronic kidney disease: Secondary | ICD-10-CM | POA: Diagnosis not present

## 2021-03-14 DIAGNOSIS — D631 Anemia in chronic kidney disease: Secondary | ICD-10-CM | POA: Diagnosis not present

## 2021-03-14 DIAGNOSIS — I5032 Chronic diastolic (congestive) heart failure: Secondary | ICD-10-CM | POA: Diagnosis not present

## 2021-03-14 DIAGNOSIS — M19011 Primary osteoarthritis, right shoulder: Secondary | ICD-10-CM | POA: Diagnosis not present

## 2021-03-14 DIAGNOSIS — E1122 Type 2 diabetes mellitus with diabetic chronic kidney disease: Secondary | ICD-10-CM | POA: Diagnosis not present

## 2021-03-19 ENCOUNTER — Other Ambulatory Visit: Payer: Self-pay | Admitting: Cardiovascular Disease

## 2021-03-19 DIAGNOSIS — I130 Hypertensive heart and chronic kidney disease with heart failure and stage 1 through stage 4 chronic kidney disease, or unspecified chronic kidney disease: Secondary | ICD-10-CM | POA: Diagnosis not present

## 2021-03-19 DIAGNOSIS — E1122 Type 2 diabetes mellitus with diabetic chronic kidney disease: Secondary | ICD-10-CM | POA: Diagnosis not present

## 2021-03-19 DIAGNOSIS — N183 Chronic kidney disease, stage 3 unspecified: Secondary | ICD-10-CM | POA: Diagnosis not present

## 2021-03-19 DIAGNOSIS — M19011 Primary osteoarthritis, right shoulder: Secondary | ICD-10-CM | POA: Diagnosis not present

## 2021-03-19 DIAGNOSIS — I5032 Chronic diastolic (congestive) heart failure: Secondary | ICD-10-CM | POA: Diagnosis not present

## 2021-03-19 DIAGNOSIS — D631 Anemia in chronic kidney disease: Secondary | ICD-10-CM | POA: Diagnosis not present

## 2021-03-20 DIAGNOSIS — N183 Chronic kidney disease, stage 3 unspecified: Secondary | ICD-10-CM | POA: Diagnosis not present

## 2021-03-20 DIAGNOSIS — E1122 Type 2 diabetes mellitus with diabetic chronic kidney disease: Secondary | ICD-10-CM | POA: Diagnosis not present

## 2021-03-20 DIAGNOSIS — D631 Anemia in chronic kidney disease: Secondary | ICD-10-CM | POA: Diagnosis not present

## 2021-03-20 DIAGNOSIS — I5032 Chronic diastolic (congestive) heart failure: Secondary | ICD-10-CM | POA: Diagnosis not present

## 2021-03-20 DIAGNOSIS — I130 Hypertensive heart and chronic kidney disease with heart failure and stage 1 through stage 4 chronic kidney disease, or unspecified chronic kidney disease: Secondary | ICD-10-CM | POA: Diagnosis not present

## 2021-03-20 DIAGNOSIS — M19011 Primary osteoarthritis, right shoulder: Secondary | ICD-10-CM | POA: Diagnosis not present

## 2021-03-27 DIAGNOSIS — E538 Deficiency of other specified B group vitamins: Secondary | ICD-10-CM | POA: Diagnosis not present

## 2021-03-27 DIAGNOSIS — R32 Unspecified urinary incontinence: Secondary | ICD-10-CM | POA: Diagnosis not present

## 2021-03-27 DIAGNOSIS — M48061 Spinal stenosis, lumbar region without neurogenic claudication: Secondary | ICD-10-CM | POA: Diagnosis not present

## 2021-03-27 DIAGNOSIS — M4316 Spondylolisthesis, lumbar region: Secondary | ICD-10-CM | POA: Diagnosis not present

## 2021-03-27 DIAGNOSIS — I6522 Occlusion and stenosis of left carotid artery: Secondary | ICD-10-CM | POA: Diagnosis not present

## 2021-03-27 DIAGNOSIS — M4726 Other spondylosis with radiculopathy, lumbar region: Secondary | ICD-10-CM | POA: Diagnosis not present

## 2021-03-27 DIAGNOSIS — D631 Anemia in chronic kidney disease: Secondary | ICD-10-CM | POA: Diagnosis not present

## 2021-03-27 DIAGNOSIS — M5136 Other intervertebral disc degeneration, lumbar region: Secondary | ICD-10-CM | POA: Diagnosis not present

## 2021-03-27 DIAGNOSIS — I5032 Chronic diastolic (congestive) heart failure: Secondary | ICD-10-CM | POA: Diagnosis not present

## 2021-03-27 DIAGNOSIS — Z7982 Long term (current) use of aspirin: Secondary | ICD-10-CM | POA: Diagnosis not present

## 2021-03-27 DIAGNOSIS — I2510 Atherosclerotic heart disease of native coronary artery without angina pectoris: Secondary | ICD-10-CM | POA: Diagnosis not present

## 2021-03-27 DIAGNOSIS — M810 Age-related osteoporosis without current pathological fracture: Secondary | ICD-10-CM | POA: Diagnosis not present

## 2021-03-27 DIAGNOSIS — M19011 Primary osteoarthritis, right shoulder: Secondary | ICD-10-CM | POA: Diagnosis not present

## 2021-03-27 DIAGNOSIS — E1142 Type 2 diabetes mellitus with diabetic polyneuropathy: Secondary | ICD-10-CM | POA: Diagnosis not present

## 2021-03-27 DIAGNOSIS — I38 Endocarditis, valve unspecified: Secondary | ICD-10-CM | POA: Diagnosis not present

## 2021-03-27 DIAGNOSIS — H409 Unspecified glaucoma: Secondary | ICD-10-CM | POA: Diagnosis not present

## 2021-03-27 DIAGNOSIS — N183 Chronic kidney disease, stage 3 unspecified: Secondary | ICD-10-CM | POA: Diagnosis not present

## 2021-03-27 DIAGNOSIS — I447 Left bundle-branch block, unspecified: Secondary | ICD-10-CM | POA: Diagnosis not present

## 2021-03-27 DIAGNOSIS — E559 Vitamin D deficiency, unspecified: Secondary | ICD-10-CM | POA: Diagnosis not present

## 2021-03-27 DIAGNOSIS — E7800 Pure hypercholesterolemia, unspecified: Secondary | ICD-10-CM | POA: Diagnosis not present

## 2021-03-27 DIAGNOSIS — I130 Hypertensive heart and chronic kidney disease with heart failure and stage 1 through stage 4 chronic kidney disease, or unspecified chronic kidney disease: Secondary | ICD-10-CM | POA: Diagnosis not present

## 2021-03-27 DIAGNOSIS — E1122 Type 2 diabetes mellitus with diabetic chronic kidney disease: Secondary | ICD-10-CM | POA: Diagnosis not present

## 2021-04-04 ENCOUNTER — Encounter: Payer: Self-pay | Admitting: Cardiovascular Disease

## 2021-04-04 ENCOUNTER — Other Ambulatory Visit: Payer: Self-pay

## 2021-04-04 ENCOUNTER — Ambulatory Visit (INDEPENDENT_AMBULATORY_CARE_PROVIDER_SITE_OTHER): Payer: Medicare Other | Admitting: Cardiovascular Disease

## 2021-04-04 VITALS — BP 120/58 | HR 72 | Ht 59.0 in | Wt 132.2 lb

## 2021-04-04 DIAGNOSIS — I25119 Atherosclerotic heart disease of native coronary artery with unspecified angina pectoris: Secondary | ICD-10-CM

## 2021-04-04 DIAGNOSIS — N184 Chronic kidney disease, stage 4 (severe): Secondary | ICD-10-CM | POA: Diagnosis not present

## 2021-04-04 DIAGNOSIS — I359 Nonrheumatic aortic valve disorder, unspecified: Secondary | ICD-10-CM

## 2021-04-04 DIAGNOSIS — I5032 Chronic diastolic (congestive) heart failure: Secondary | ICD-10-CM | POA: Diagnosis not present

## 2021-04-04 MED ORDER — FUROSEMIDE 20 MG PO TABS: 20.0000 mg | ORAL_TABLET | Freq: Every day | ORAL | 1 refills | Status: DC

## 2021-04-04 NOTE — Progress Notes (Signed)
Cardiology Office Note:    Date:  04/04/2021   ID:  Sara Garza, DOB Nov 04, 1923, MRN [id number]  PCP:  [name], NP   [facility] Providers Cardiologist:  Sherren Mocha, MD     Referring MD: [name], NP   Chief Complaint  Patient presents with   Shortness of Breath    History of Present Illness:    Sara Garza is a 88 y.o. female with a hx of coronary artery disease, aortic stenosis, and chronic diastolic heart failure, presenting for follow-up evaluation.  She underwent [name] for treatment of severe symptomatic aortic stenosis in 2014. She was treated with a 23 mm Medtronic Corevalve through the moderate risk SurTAVI aortic stenosis trial. She also is followed for moderate coronary artery disease which was evaluated with pressure wire analysis of the RCA and LCx demonstrating no hemodynamic evidence of flow-obstruction. 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    Sara Garza is here with a friend today.  She is actually getting along fairly well now approaching her 18 birthday.  She would like to change the way that she is taking Lasix.  She currently is taking 40 mg every other day, but on the days that she takes that she is having a lot of problems with urinary frequency and incontinence.  She request to try it at a dose of 20 mg daily.  She continues to have exertional dyspnea but no recent change in her symptoms.  She has no orthopnea, PND, or chest pain.  Past Medical History:  Diagnosis Date   Arthritis    CAD (coronary artery disease)    Carotid stenosis, left    50-60% stable   Diabetes mellitus    Glaucoma    Hyperlipidemia    Hypertension    Leg pain    ABIs 2/18: normal bilaterally.   Osteoporosis    Valvular heart disease    Aortic Stenosis s/p [name] 2014 // Mitral stenosis // Echo 09/2018: EF > 65, mod LVH, severe focal basal hypertrophy, RVSP 39.8,  mild to mod MR, mod to severe MS (mean 9), AVR with mild AI, severe LAE (similar to prior echo)    Past Surgical History:  Procedure Laterality Date   ANOMALOUS PULMONARY VENOUS RETURN REPAIR, TOTAL     APPENDECTOMY     CARDIAC VALVE REPLACEMENT  04/29/2012   Cataracts Bilateral    CESAREAN SECTION     FOOT SURGERY     [name]   HIP ARTHROPLASTY Right 07/31/2013   Procedure: ARTHROPLASTY BIPOLAR HIP;  Surgeon: [name], MD;  Location: [facility];  Service: Orthopedics;  Laterality: Right;   INTRAMEDULLARY (IM) NAIL INTERTROCHANTERIC Left 01/01/2020   Procedure: INTRAMEDULLARY (IM) NAIL INTERTROCHANTRIC;  Surgeon: Rod Can, MD;  Location: [facility];  Service: Orthopedics;  Laterality: Left;   PERCUTANEOUS CORONARY STENT INTERVENTION (PCI-S) N/A 01/02/2012   Procedure: PERCUTANEOUS CORONARY STENT INTERVENTION (PCI-S);  Surgeon: Sherren Mocha, MD;  Location: [facility] CATH LAB;  Service: Cardiovascular;  Laterality: N/A;    Current Medications: Current Meds  Medication Sig   acetaminophen (TYLENOL) 500 MG tablet Take 2 tablets (1,000 mg total) by mouth 4 (four) times daily.   amLODipine (NORVASC) 5 MG tablet TAKE 1 TABLET(5 MG) BY MOUTH DAILY   amoxicillin (AMOXIL) 500 MG capsule TAKE 4 CAPSULES BY MOUTH ONE HOUR PRIOR TO DENTAL PROCEDURES   aspirin EC 81 MG tablet  Take 81 mg by mouth daily. Swallow whole.   bimatoprost (LUMIGAN) 0.01 % SOLN Place 1 drop into both eyes at bedtime.    Calcium Carb-Cholecalciferol 500-400 MG-UNIT TABS Take 1 tablet by mouth daily.   carvedilol (COREG) 3.125 MG tablet TAKE 1 TABLET(3.125 MG) BY MOUTH TWICE DAILY   COVID-19 mRNA bivalent vaccine, Pfizer, (PFIZER COVID-19 VAC BIVALENT) injection Inject into the muscle.   diclofenac Sodium (VOLTAREN) 1 % GEL Apply 2 g topically 4 (four) times daily.   ezetimibe (ZETIA) 10 MG tablet TAKE 1 TABLET(10 MG) BY MOUTH DAILY   furosemide (LASIX) 20 MG tablet Take 1 tablet (20 mg total) by mouth daily.   Multiple  Vitamin (MULTIVITAMIN) tablet Take 1 tablet by mouth daily.   Multiple Vitamins-Minerals (PRESERVISION AREDS 2+MULTI VIT PO) Take 2 capsules by mouth in the morning and at bedtime.   [DISCONTINUED] furosemide (LASIX) 40 MG tablet Take 40 mg by mouth every other day.     Allergies:   Statins and Gabapentin   Social History   Socioeconomic History   Marital status: Widowed    Spouse name: Not on file   Number of children: Not on file   Years of education: Not on file   Highest education level: Not on file  Occupational History   Not on file  Tobacco Use   Smoking status: Never    Passive exposure: Yes   Smokeless tobacco: Never  Substance and Sexual Activity   Alcohol use: Yes    Alcohol/week: 0.0 standard drinks    Comment: very seldom   Drug use: No   Sexual activity: Not Currently  Other Topics Concern   Not on file  Social History Narrative   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      She likes to read and she takes classes at [employer] Strain: Not on [employer] Insecurity: Not on file  Transportation Needs: Not on file  Physical Activity: Not on file  Stress: Not on file  Social Connections: Not on file     Family History: The patient's family history includes COPD in her father; Cancer in her father; Lung cancer in an other family member.  ROS:   Please see the history of present illness.    All other systems reviewed and are negative.  EKGs/Labs/Other Studies Reviewed:    The following studies were reviewed today: Echo 12/25/20: 1. There is a contained interventricular septal aneurysm measuring 1.5cm  in depth and 0.9cm in width. Left ventricular ejection fraction, by  estimation, is 60 to 65%. The left ventricle has normal function. The left  ventricle has no regional wall motion   abnormalities. There is severe concentric left ventricular hypertrophy.  Left  ventricular diastolic parameters are consistent with Grade I  diastolic dysfunction (impaired relaxation). Elevated left ventricular  end-diastolic pressure.   2. Right ventricular systolic function is normal. The right ventricular  size is normal. There is normal pulmonary artery systolic pressure. The  estimated right ventricular systolic pressure is 32.5 mmHg.   3. Left atrial size was severely dilated.   4. The mitral valve is degenerative. Moderate mitral valve regurgitation.  Severe mitral annular calcification.   5. The aortic valve has been repaired/replaced. There is a 23 mm  CoreValve-Evolut Pro prosthetic, stented ([name]) valve present in the  aortic position.      Perivalvular  Aortic regurgitation is moderate. No aortic stenosis is  present. Aortic regurgitation PHT measures 423 msec. Aortic valve area, by  VTI measures 1.94 cm. Aortic valve mean gradient measures 12.0 mmHg.  Aortic valve Vmax measures 2.31 m/s.   6. The inferior vena cava is normal in size with <50% respiratory  variability, suggesting right atrial pressure of 8 mmHg.   7. Compared to echo study date 10/13/2020, LVF remains preserved with  unchanged size of interventricular septal aneurysm which is contained.  [name] is stable with mean AVG decreased from 13.7 to 1mHg, VMax decreased  from 2.59 to 2cm/s, AVA increased from   1.21 to 1.94cm2 and DI increased from 0.32 to 0.56. PHT remains c/w  moderate perivalvular AI which is visible by colorflow doppler on this  study. The MV is degenerative with severe MAC and moderately thickened MV  leaflets but doppler not performed to  obtain mean MVG or MVA.   EKG:  EKG is ordered today.  The ekg ordered today demonstrates normal sinus rhythm, left bundle branch block, no change from previous tracings.  Recent Labs: 04/05/2020: ALT 12 02/16/2021: BUN 43; Creat 1.63; Hemoglobin 12.0; Platelets 234; Potassium 4.6; Sodium 143; TSH 1.42  Recent Lipid Panel    Component  Value Date/Time   CHOL 203 (H) 12/01/2019 1014   TRIG 149 12/01/2019 1014   HDL 42 (L) 12/01/2019 1014   CHOLHDL 4.8 12/01/2019 1014   VLDL 41.6 (H) 11/26/2018 1201   LDLCALC 133 (H) 12/01/2019 1014   LDLDIRECT 143.0 11/26/2018 1201     Risk Assessment/Calculations:           Physical Exam:    VS:  BP (!) 120/58    Pulse 72    Ht _0  (1.499 m)    Wt 132 lb 3.2 oz (60 kg)    SpO2 95%    BMI 26.70 kg/m     Wt Readings from Last 3 Encounters:  04/04/21 132 lb 3.2 oz (60 kg)  02/16/21 142 lb (64.4 kg)  07/31/20 145 lb 12.8 oz (66.1 kg)     GEN:  Well nourished, well developed pleasant elderly woman in no acute distress HEENT: Normal NECK: No JVD; No carotid bruits LYMPHATICS: No lymphadenopathy CARDIAC: RRR, 2 of 6 early peaking systolic murmur at the left upper sternal border, no diastolic murmur appreciated RESPIRATORY:  Clear to auscultation without rales, wheezing or rhonchi  ABDOMEN: Soft, non-tender, non-distended MUSCULOSKELETAL: Trace bilateral pretibial edema; No deformity  SKIN: Warm and dry NEUROLOGIC:  Alert and oriented x 3 PSYCHIATRIC:  Normal affect   ASSESSMENT:    1. Aortic valve disease   2. Chronic diastolic CHF (congestive heart failure) ([place]   3. Coronary artery disease involving native coronary artery of native heart with angina pectoris ([facility]   4. Chronic kidney disease, stage IV (severe) (HCC)    PLAN:    In order of problems listed above:  Patient is status post [name].  She is clinically stable with moderate paravalvular regurgitation and normal transvalvular gradients.  There have been no changes seen on her recent echo studies.  I will plan to see her back in 6 months for follow-up evaluation.  She remains on aspirin for antiplatelet therapy. Clinically stable with New York Heart Association functional class II symptoms.  We will adjust her furosemide to 20 mg daily at her request.  We will check a metabolic panel in 2 weeks.  I will see  her back in 6 months for follow-up.  Continue aspirin.  No anginal symptoms reported.  Antianginal program includes amlodipine and carvedilol. Repeat metabolic panel in 2 weeks, delicate balance with her volume management and chronic kidney disease.           Medication Adjustments/Labs and Tests Ordered: Current medicines are reviewed at length with the patient today.  Concerns regarding medicines are outlined above.  Orders Placed This Encounter  Procedures   Basic Metabolic Panel (BMET)   EKG 12-Lead   Meds ordered this encounter  Medications   furosemide (LASIX) 20 MG tablet    Sig: Take 1 tablet (20 mg total) by mouth daily.    Dispense:  90 tablet    Refill:  1    Dose change    Patient Instructions  Medication Instructions:  Your physician has recommended you make the following change in your medication:  DECREASE Lasix (FUROSEMIDE) to 75m once daily   *If you need a refill on your cardiac medications before your next appointment, please call your pharmacy*   Lab Work: BMET in 2 weeks If you have labs (blood work) drawn today and your tests are completely normal, you will receive your results only by: [place](if you have MyChart) OR A paper copy in the mail If you have any lab test that is abnormal or we need to change your treatment, we will call you to review the results.   Follow-Up: [facility] and your health needs are our priority.  As part of our continuing mission to provide you with exceptional heart care, we have created designated Provider Care Teams.  These Care Teams include your primary Cardiologist (physician) and Advanced Practice Providers (APPs -  Physician Assistants and Nurse Practitioners) who all work together to provide you with the care you need, when you need it.   Your next appointment:   6 month(s)  The format for your next appointment:   In Person  Provider:   MSherren Mocha MD         Signed, MSherren Mocha MD  04/04/2021 1:43 PM    [place]

## 2021-04-04 NOTE — Patient Instructions (Signed)
Medication Instructions:  Your physician has recommended you make the following change in your medication:  DECREASE Lasix (FUROSEMIDE) to 20mg  once daily   *If you need a refill on your cardiac medications before your next appointment, please call your pharmacy*   Lab Work: BMET in 2 weeks If you have labs (blood work) drawn today and your tests are completely normal, you will receive your results only by: [place] (if you have MyChart) OR A paper copy in the mail If you have any lab test that is abnormal or we need to change your treatment, we will call you to review the results.   Follow-Up: At [facility], you and your health needs are our priority.  As part of our continuing mission to provide you with exceptional heart care, we have created designated Provider Care Teams.  These Care Teams include your primary Cardiologist (physician) and Advanced Practice Providers (APPs -  Physician Assistants and Nurse Practitioners) who all work together to provide you with the care you need, when you need it.   Your next appointment:   6 month(s)  The format for your next appointment:   In Person  Provider:   Sherren Mocha, MD

## 2021-04-05 DIAGNOSIS — I130 Hypertensive heart and chronic kidney disease with heart failure and stage 1 through stage 4 chronic kidney disease, or unspecified chronic kidney disease: Secondary | ICD-10-CM | POA: Diagnosis not present

## 2021-04-05 DIAGNOSIS — M19011 Primary osteoarthritis, right shoulder: Secondary | ICD-10-CM | POA: Diagnosis not present

## 2021-04-05 DIAGNOSIS — D631 Anemia in chronic kidney disease: Secondary | ICD-10-CM | POA: Diagnosis not present

## 2021-04-05 DIAGNOSIS — N183 Chronic kidney disease, stage 3 unspecified: Secondary | ICD-10-CM | POA: Diagnosis not present

## 2021-04-05 DIAGNOSIS — E1122 Type 2 diabetes mellitus with diabetic chronic kidney disease: Secondary | ICD-10-CM | POA: Diagnosis not present

## 2021-04-05 DIAGNOSIS — I5032 Chronic diastolic (congestive) heart failure: Secondary | ICD-10-CM | POA: Diagnosis not present

## 2021-04-10 DIAGNOSIS — I130 Hypertensive heart and chronic kidney disease with heart failure and stage 1 through stage 4 chronic kidney disease, or unspecified chronic kidney disease: Secondary | ICD-10-CM | POA: Diagnosis not present

## 2021-04-10 DIAGNOSIS — N183 Chronic kidney disease, stage 3 unspecified: Secondary | ICD-10-CM | POA: Diagnosis not present

## 2021-04-10 DIAGNOSIS — D631 Anemia in chronic kidney disease: Secondary | ICD-10-CM | POA: Diagnosis not present

## 2021-04-10 DIAGNOSIS — M19011 Primary osteoarthritis, right shoulder: Secondary | ICD-10-CM | POA: Diagnosis not present

## 2021-04-10 DIAGNOSIS — E1122 Type 2 diabetes mellitus with diabetic chronic kidney disease: Secondary | ICD-10-CM | POA: Diagnosis not present

## 2021-04-10 DIAGNOSIS — I5032 Chronic diastolic (congestive) heart failure: Secondary | ICD-10-CM | POA: Diagnosis not present

## 2021-04-11 NOTE — Telephone Encounter (Signed)
Pt has already had home heath & has been d/c on 1/10.  BJ called to clarify needs. Since pt was seen on 02/16/21 for chronic shoulder pain, etiology of pain is required for insurance coverage. According to note by orthopaedic surgery on shoulder pain due to osteoarthritis. BJ request is to addend the note for OV 02/16/21 to include etiology of shoulder pain. Should read: chronic right shoulder pain secondary to osteoarthritis of right shoulder. Her request is that this be added today & faxed to the number provided in previous note.

## 2021-04-12 ENCOUNTER — Telehealth: Payer: Self-pay | Admitting: Adult Health

## 2021-04-12 NOTE — Telephone Encounter (Signed)
Left message for patient to call back and schedule Medicare Annual Wellness Visit (AWV) either virtually or in office. Left  my Herbie Drape number [phone]   Last AWV 12/08/17 please schedule at anytime with [facility] [place] [facility] or 2   This should be a 45 minute visit.

## 2021-04-17 ENCOUNTER — Telehealth: Payer: Self-pay | Admitting: Adult Health

## 2021-04-17 NOTE — Telephone Encounter (Signed)
Returned patents call  04/17/21  Patient left message 04/16/21 wanting to r/s awv appt

## 2021-04-17 NOTE — Telephone Encounter (Signed)
BJ stated that she received everything. Nothing further needed.

## 2021-04-18 ENCOUNTER — Other Ambulatory Visit: Payer: Self-pay

## 2021-04-18 ENCOUNTER — Other Ambulatory Visit: Payer: Medicare Other | Admitting: *Deleted

## 2021-04-18 DIAGNOSIS — N184 Chronic kidney disease, stage 4 (severe): Secondary | ICD-10-CM | POA: Diagnosis not present

## 2021-04-18 DIAGNOSIS — I25119 Atherosclerotic heart disease of native coronary artery with unspecified angina pectoris: Secondary | ICD-10-CM | POA: Diagnosis not present

## 2021-04-18 DIAGNOSIS — I359 Nonrheumatic aortic valve disorder, unspecified: Secondary | ICD-10-CM

## 2021-04-18 DIAGNOSIS — I5032 Chronic diastolic (congestive) heart failure: Secondary | ICD-10-CM | POA: Diagnosis not present

## 2021-04-18 LAB — BASIC METABOLIC PANEL
BUN/Creatinine Ratio: 28 (ref 12–28)
BUN: 45 mg/dL — ABNORMAL HIGH (ref 10–36)
CO2: 23 mmol/L (ref 20–29)
Calcium: 9.3 mg/dL (ref 8.7–10.3)
Chloride: 106 mmol/L (ref 96–106)
Creatinine, Ser: 1.59 mg/dL — ABNORMAL HIGH (ref 0.57–1.00)
Glucose: 110 mg/dL — ABNORMAL HIGH (ref 70–99)
Potassium: 4.6 mmol/L (ref 3.5–5.2)
Sodium: 142 mmol/L (ref 134–144)
eGFR: 30 mL/min/{1.73_m2} — ABNORMAL LOW (ref >59)

## 2021-04-20 ENCOUNTER — Ambulatory Visit: Payer: Medicare Other | Admitting: Cardiovascular Disease

## 2021-04-20 ENCOUNTER — Encounter: Payer: Self-pay | Admitting: *Deleted

## 2021-04-20 ENCOUNTER — Telehealth: Payer: Self-pay | Admitting: Cardiovascular Disease

## 2021-04-20 NOTE — Telephone Encounter (Signed)
The patient has been notified of the result and verbalized understanding.  All questions (if any) were answered. Nuala Alpha, LPN 1/96/9409 8:28 AM   Pt request a copy of lab results to be mailed to her confirmed address on file. Will mail this out today.  Pt was gracious for all the assistance provided.

## 2021-04-20 NOTE — Telephone Encounter (Signed)
-----   Message from Sherren Mocha, MD sent at 04/19/2021 10:08 PM EST ----- Labs stable and look good. Continue same Rx.

## 2021-04-20 NOTE — Telephone Encounter (Signed)
Follow Up:     Patient is returning a call from today, concerning her lab results.

## 2021-04-23 ENCOUNTER — Ambulatory Visit (INDEPENDENT_AMBULATORY_CARE_PROVIDER_SITE_OTHER): Payer: Medicare Other

## 2021-04-23 VITALS — Ht 59.0 in | Wt 132.0 lb

## 2021-04-23 DIAGNOSIS — Z0000 Encounter for general adult medical examination without abnormal findings: Secondary | ICD-10-CM

## 2021-04-23 NOTE — Progress Notes (Signed)
Subjective:   Sara Garza is a 88 y.o. female who presents for Medicare Annual (Subsequent) preventive examination.  Review of Systems    No ROS       Objective:    Today's Vitals   04/23/21 1124  Weight: 132 lb (59.9 kg)  Height: 4\' 11"  (1.499 m)   Body mass index is 26.66 kg/m.  Advanced Directives 04/23/2021 02/14/2020 12/30/2019 12/30/2019 12/08/2017 12/04/2016 08/22/2016  Does Patient Have a Medical Advance Directive? Yes Yes Yes Yes Yes Yes Yes  Type of Paramedic of [place];Living will [place];Living will [place];Living will [place];Living will;Out of facility DNR (pink MOST or yellow form)  Does patient want to make changes to medical advance directive? No - Patient declined No - Patient declined No - Patient declined No - Patient declined - - -  Copy of [place] in Chart? No - copy requested No - copy requested No - copy requested No - copy requested - - -  Pre-existing out of facility DNR order (yellow form or pink MOST form) - - - - - - -    Current Medications (verified) Outpatient Encounter Medications as of 04/23/2021  Medication Sig   acetaminophen (TYLENOL) 500 MG tablet Take 2 tablets (1,000 mg total) by mouth 4 (four) times daily.   amLODipine (NORVASC) 5 MG tablet TAKE 1 TABLET(5 MG) BY MOUTH DAILY   amoxicillin (AMOXIL) 500 MG capsule TAKE 4 CAPSULES BY MOUTH ONE HOUR PRIOR TO DENTAL PROCEDURES   aspirin EC 81 MG tablet Take 81 mg by mouth daily. Swallow whole.   bimatoprost (LUMIGAN) 0.01 % SOLN Place 1 drop into both eyes at bedtime.    Calcium Carb-Cholecalciferol 500-400 MG-UNIT TABS Take 1 tablet by mouth daily.   carvedilol (COREG) 3.125 MG tablet TAKE 1 TABLET(3.125 MG) BY MOUTH TWICE DAILY   COVID-19 mRNA bivalent vaccine, Pfizer, (PFIZER COVID-19 VAC BIVALENT) injection Inject into the muscle.   diclofenac Sodium  (VOLTAREN) 1 % GEL Apply 2 g topically 4 (four) times daily.   ezetimibe (ZETIA) 10 MG tablet TAKE 1 TABLET(10 MG) BY MOUTH DAILY   furosemide (LASIX) 20 MG tablet Take 1 tablet (20 mg total) by mouth daily.   Multiple Vitamin (MULTIVITAMIN) tablet Take 1 tablet by mouth daily.   Multiple Vitamins-Minerals (PRESERVISION AREDS 2+MULTI VIT PO) Take 2 capsules by mouth in the morning and at bedtime.   No facility-administered encounter medications on file as of 04/23/2021.    Allergies (verified) Statins and Gabapentin   History: Past Medical History:  Diagnosis Date   Arthritis    CAD (coronary artery disease)    Carotid stenosis, left    50-60% stable   Diabetes mellitus    Glaucoma    Hyperlipidemia    Hypertension    Leg pain    ABIs 2/18: normal bilaterally.   Osteoporosis    Valvular heart disease    Aortic Stenosis s/p [name] 2014 // Mitral stenosis // Echo 09/2018: EF > 65, mod LVH, severe focal basal hypertrophy, RVSP 39.8, mild to mod MR, mod to severe MS (mean 9), AVR with mild AI, severe LAE (similar to prior echo)   Past Surgical History:  Procedure Laterality Date   ANOMALOUS PULMONARY VENOUS RETURN REPAIR, TOTAL     APPENDECTOMY     CARDIAC VALVE REPLACEMENT  04/29/2012   Cataracts Bilateral    CESAREAN SECTION  FOOT SURGERY     Sara Garza   HIP ARTHROPLASTY Right 07/31/2013   Procedure: ARTHROPLASTY BIPOLAR HIP;  Surgeon: [name], MD;  Location: [facility];  Service: Orthopedics;  Laterality: Right;   INTRAMEDULLARY (IM) NAIL INTERTROCHANTERIC Left 01/01/2020   Procedure: INTRAMEDULLARY (IM) NAIL INTERTROCHANTRIC;  Surgeon: Rod Can, MD;  Location: [facility];  Service: Orthopedics;  Laterality: Left;   PERCUTANEOUS CORONARY STENT INTERVENTION (PCI-S) N/A 01/02/2012   Procedure: PERCUTANEOUS CORONARY STENT INTERVENTION (PCI-S);  Surgeon: Sherren Mocha, MD;  Location: [facility] CATH LAB;  Service: Cardiovascular;  Laterality: N/A;   Family History  Problem Relation  Age of Onset   COPD Father    Cancer Father        lung cancer   Lung cancer Other    Social History   Socioeconomic History   Marital status: Widowed    Spouse name: Not on file   Number of children: Not on file   Years of education: Not on file   Highest education level: Not on file  Occupational History   Not on file  Tobacco Use   Smoking status: Never    Passive exposure: Yes   Smokeless tobacco: Never  Substance and Sexual Activity   Alcohol use: Yes    Alcohol/week: 0.0 standard drinks    Comment: very seldom   Drug use: No   Sexual activity: Not Currently  Other Topics Concern   Not on file  Social History Narrative   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      She likes to read and she takes classes at [employer] Strain: Low Risk    Difficulty of Paying Living Expenses: Not hard at all  Food Insecurity: No Food Insecurity   Worried About Charity fundraiser in the Last Year: Never true   Arboriculturist in the Last Year: Never true  Transportation Needs: No Transportation Needs   Lack of Transportation (Medical): No   Lack of Transportation (Non-Medical): No  Physical Activity: Inactive   Days of Exercise per Week: 0 days   Minutes of Exercise per Session: 0 min  Stress: No Stress Concern Present   Feeling of Stress : Not at all  Social Connections: Moderately Integrated   Frequency of Communication with Friends and Family: More than three times a week   Frequency of Social Gatherings with Friends and Family: More than three times a week   Attends Religious Services: More than 4 times per year   Active Member of [employer] or Organizations: Yes   Attends Archivist Meetings: More than 4 times per year   Marital Status: Widowed    Clinical Intake:  Pre-visit preparation completed: Yes Diabetic? Yes Nutrition Risk Assessment:  Has the patient had any  N/V/D within the last 2 months?  No  Does the patient have any non-healing wounds?  No  Has the patient had any unintentional weight loss or weight gain?  No   Diabetes:  Is the patient diabetic?  Yes  If diabetic, was a CBG obtained today?  No  Did the patient bring in their glucometer from home?  No Audio Visit How often do you monitor your CBG's Patient states does not monitor CB'S.   Financial Strains and Diabetes Management:  Are you having any financial strains with the device, your supplies or your medication? No .  Does the patient want to be seen by Chronic Care Management for management of their diabetes?  No  Would the patient like to be referred to a Nutritionist or for Diabetic Management?  No  Followed by PCP  Diabetic Exams:  Diabetic Eye Exam: Completed Yes.   Diabetic Foot Exam: Completed Yes. Pt has been advised about the importance in completing this exam. Pt is scheduled for diabetic foot exam on Followed by PCP.    Activities of Daily Living In your present state of health, do you have any difficulty performing the following activities: 04/23/2021  Hearing? N  Comment Wears hearing aids  Vision? N  Difficulty concentrating or making decisions? N  Walking or climbing stairs? N  Comment Patient uses walker  Dressing or bathing? N  Doing errands, shopping? N  Comment [place] and eating ? N  Using the Toilet? N  In the past six months, have you accidently leaked urine? Y  Comment Wears pads and breifs  Do you have problems with loss of bowel control? N  Managing your Medications? N  Managing your Finances? N  Housekeeping or managing your Housekeeping? N  Some recent data might be hidden    Patient Care Team: [name], NP as PCP - General (Family Medicine) Sherren Mocha, MD as PCP - Cardiology (Cardiology) Sherren Mocha, MD (Cardiology) Suella Broad, MD as Consulting Physician (Physical Medicine and  Rehabilitation) Irine Seal, MD as Attending Physician (Urology) Viona Gilmore, [facility] as Pharmacist (Pharmacist)  Indicate any recent Medical Services you may have received from other than [facility] providers in the past year (date may be approximate).     Assessment:   This is a routine wellness examination for Sara Garza.  Virtual Visit via Telephone Note  I connected with  Sara Garza on 04/23/21 at 11:15 AM EST by telephone and verified that I am speaking with the correct person using two identifiers.  Location: Patient: Home Provider: Office Persons participating in the virtual visit: patient/Nurse Health Advisor   I discussed the limitations, risks, security and privacy concerns of performing an evaluation and management service by telephone and the availability of in person appointments. The patient expressed understanding and agreed to proceed.  Interactive audio and video telecommunications were attempted between this nurse and patient, however failed, due to patient having technical difficulties OR patient did not have access to video capability.  We continued and completed visit with audio only.  Some vital signs may be absent or patient reported.   Sara Peaches, LPN   Hearing/Vision screen Hearing Screening - Comments:: Wears hearing aids Vision Screening - Comments:: Wears reading glasses.Followed by Dr Nolon Stalls  Dietary issues and exercise activities discussed: Current Exercise Habits: The patient does not participate in regular exercise at present   Goals Addressed             This Visit's Progress    DIET - INCREASE WATER INTAKE       Increase water throughout the day in small amounts.       Depression Screen PHQ 2/9 Scores 04/23/2021 02/16/2021 12/01/2019 12/08/2017 08/27/2017 12/04/2016 01/06/2015  PHQ - 2 Score 0 0 0 0 0 0 0    Fall Risk Fall Risk  04/23/2021 02/16/2021 12/01/2019 02/19/2019 12/08/2017  Falls in the past year? 0 0 0 0 No  Comment - - -  Emmi Telephone Survey: data to providers prior to load -  Number falls in past yr: 0 0 0 - -  Injury with Fall? 0 0 0 - -  Risk for fall due to : - - - - -  Follow up - - Falls evaluation completed - -    FALL RISK PREVENTION PERTAINING TO THE HOME:  Any stairs in or around the home? No  If so, are there any without handrails? No  Home free of loose throw rugs in walkways, pet beds, electrical cords, etc? Yes  Adequate lighting in your home to reduce risk of falls? Yes   ASSISTIVE DEVICES UTILIZED TO PREVENT FALLS:  Life alert? Yes  Use of a cane, walker or w/c? Yes  Grab bars in the bathroom? Yes  Shower chair or bench in shower? Yes  Elevated toilet seat or a handicapped toilet? Yes   TIMED UP AND GO:  Was the test performed? No . Audio Visit  Cognitive Function: MMSE - Mini Mental State Exam 12/08/2017 12/04/2016  Not completed: (No Data) (No Data)     Immunizations Immunization History  Administered Date(s) Administered   Hepatitis A 10/09/2005   Influenza Split 01/02/2011, 12/24/2011   Influenza Whole 04/01/2005, 01/01/2007, 01/02/2008, 01/06/2009, 01/17/2010   Influenza, High Dose Seasonal PF 01/04/2014, 01/06/2015, 01/09/2016, 01/07/2017, 12/25/2017   Influenza,inj,Quad PF,6+ Mos 12/25/2012   Influenza-Unspecified 01/04/2020   PFIZER(Purple Top)SARS-COV-2 Vaccination 04/02/2019, 04/29/2019, 12/30/2019   Pfizer Covid-19 Vaccine Bivalent Booster 40yrs & up 01/26/2021   Pneumococcal Conjugate-13 02/22/2013   Pneumococcal Polysaccharide-23 04/01/2005   Td 04/01/2000   Tetanus 07/16/2013   Zoster, Live 10/09/2005    Flu Vaccine status: Due, Education has been provided regarding the importance of this vaccine. Advised may receive this vaccine at local pharmacy or Health Dept. Aware to provide a copy of the vaccination record if obtained from local pharmacy or Health Dept. Verbalized acceptance and understanding.   Qualifies for Shingles Vaccine? Yes   Zostavax  completed No   Shingrix Completed?: No.    Education has been provided regarding the importance of this vaccine. Patient has been advised to call insurance company to determine out of pocket expense if they have not yet received this vaccine. Advised may also receive vaccine at local pharmacy or Health Dept. Verbalized acceptance and understanding.  Screening Tests Health Maintenance  Topic Date Due   FOOT EXAM  12/09/2018   URINE MICROALBUMIN  05/24/2021 (Originally 06/18/2017)   INFLUENZA VACCINE  06/29/2021 (Originally 10/30/2020)   Zoster Vaccines- Shingrix (1 of 2) 07/22/2021 (Originally 06/06/1973)   OPHTHALMOLOGY EXAM  07/19/2021   HEMOGLOBIN A1C  08/16/2021   TETANUS/TDAP  07/17/2023   Pneumonia Vaccine 24+ Years old  Completed   DEXA SCAN  Completed   COVID-19 Vaccine  Completed   HPV VACCINES  Aged Out   MAMMOGRAM  Discontinued    Health Maintenance  Health Maintenance Due  Topic Date Due   FOOT EXAM  12/09/2018    Additional Screening:   Vision Screening: Recommended annual ophthalmology exams for early detection of glaucoma and other disorders of the eye. Is the patient up to date with their annual eye exam?  Yes  Who is the provider or what is the name of the office in which the patient attends annual eye exams? Followed by Dr Nolon Stalls  Dental Screening: Recommended annual dental exams for proper oral hygiene  Community Resource Referral / Chronic Care Management:  CRR required this visit?  No   CCM required this visit?  No      Plan:     I have personally reviewed and noted the following  in the patients chart:   Medical and social history Use of alcohol, tobacco or illicit drugs  Current medications and supplements including opioid prescriptions. Patient currently not taking opioids Functional ability and status Nutritional status Physical activity Advanced directives List of other physicians Hospitalizations, surgeries, and ER visits in previous  12 months Vitals Screenings to include cognitive, depression, and falls Referrals and appointments  In addition, I have reviewed and discussed with patient certain preventive protocols, quality metrics, and best practice recommendations. A written personalized care plan for preventive services as well as general preventive health recommendations were provided to patient.     Sara Peaches, LPN   04/28/1186

## 2021-04-23 NOTE — Patient Instructions (Addendum)
Sara Garza , Thank you for taking time to come for your Medicare Wellness Visit. I appreciate your ongoing commitment to your health goals. Please review the following plan we discussed and let me know if I can assist you in the future.   These are the goals we discussed:  Goals      DIET - INCREASE WATER INTAKE     Increase water throughout the day in small amounts.     patient     Continue to get out of the home 3 to 4 times a week Maybe eat lunch out 2 times a week  Continue to go to the [facility]    Try to add some variety to your diet         This is a list of the screening recommended for you and due dates:  Health Maintenance  Topic Date Due   Complete foot exam   12/09/2018   Urine Protein Check  05/24/2021*   Flu Shot  06/29/2021*   Zoster (Shingles) Vaccine (1 of 2) 07/22/2021*   Eye exam for diabetics  07/19/2021   Hemoglobin A1C  08/16/2021   Tetanus Vaccine  07/17/2023   Pneumonia Vaccine  Completed   DEXA scan (bone density measurement)  Completed   COVID-19 Vaccine  Completed   HPV Vaccine  Aged Out   Mammogram  Discontinued  *Topic was postponed. The date shown is not the original due date.   Advanced directives: Yes  Conditions/risks identified: None  Next appointment: Follow up in one year for your annual wellness visit    Preventive Care 65 Years and Older, Female Preventive care refers to lifestyle choices and visits with your health care provider that can promote health and wellness. What does preventive care include? A yearly physical exam. This is also called an annual well check. Dental exams once or twice a year. Routine eye exams. Ask your health care provider how often you should have your eyes checked. Personal lifestyle choices, including: Daily care of your teeth and gums. Regular physical activity. Eating a healthy diet. Avoiding tobacco and drug use. Limiting alcohol use. Practicing safe sex. Taking low-dose aspirin every  day. Taking vitamin and mineral supplements as recommended by your health care provider. What happens during an annual well check? The services and screenings done by your health care provider during your annual well check will depend on your age, overall health, lifestyle risk factors, and family history of disease. Counseling  Your health care provider may ask you questions about your: Alcohol use. Tobacco use. Drug use. Emotional well-being. Home and relationship well-being. Sexual activity. Eating habits. History of falls. Memory and ability to understand (cognition). Work and work Statistician. Reproductive health. Screening  You may have the following tests or measurements: Height, weight, and BMI. Blood pressure. Lipid and cholesterol levels. These may be checked every 5 years, or more frequently if you are over 27 years old. Skin check. Lung cancer screening. You may have this screening every year starting at age 87 if you have a 30-pack-year history of smoking and currently smoke or have quit within the past 15 years. Fecal occult blood test (FOBT) of the stool. You may have this test every year starting at age 80. Flexible sigmoidoscopy or colonoscopy. You may have a sigmoidoscopy every 5 years or a colonoscopy every 10 years starting at age 19. Hepatitis C blood test. Hepatitis B blood test. Sexually transmitted disease (STD) testing. Diabetes screening. This is done by checking your  blood sugar (glucose) after you have not eaten for a while (fasting). You may have this done every 1-3 years. Bone density scan. This is done to screen for osteoporosis. You may have this done starting at age 43. Mammogram. This may be done every 1-2 years. Talk to your health care provider about how often you should have regular mammograms. Talk with your health care provider about your test results, treatment options, and if necessary, the need for more tests. Vaccines  Your health care  provider may recommend certain vaccines, such as: Influenza vaccine. This is recommended every year. Tetanus, diphtheria, and acellular pertussis (Tdap, Td) vaccine. You may need a Td booster every 10 years. Zoster vaccine. You may need this after age 81. Pneumococcal 13-valent conjugate (PCV13) vaccine. One dose is recommended after age 54. Pneumococcal polysaccharide (PPSV23) vaccine. One dose is recommended after age 23. Talk to your health care provider about which screenings and vaccines you need and how often you need them. This information is not intended to replace advice given to you by your health care provider. Make sure you discuss any questions you have with your health care provider. Document Released: 04/14/2015 Document Revised: 12/06/2015 Document Reviewed: 01/17/2015 Elsevier Interactive Patient Education  2017 [place] Prevention in the Home Falls can cause injuries. They can happen to people of all ages. There are many things you can do to make your home safe and to help prevent falls. What can I do on the outside of my home? Regularly fix the edges of walkways and driveways and fix any cracks. Remove anything that might make you trip as you walk through a door, such as a raised step or threshold. Trim any bushes or trees on the path to your home. Use bright outdoor lighting. Clear any walking paths of anything that might make someone trip, such as rocks or tools. Regularly check to see if handrails are loose or broken. Make sure that both sides of any steps have handrails. Any raised decks and porches should have guardrails on the edges. Have any leaves, snow, or ice cleared regularly. Use sand or salt on walking paths during winter. Clean up any spills in your garage right away. This includes oil or grease spills. What can I do in the bathroom? Use night lights. Install grab bars by the toilet and in the tub and shower. Do not use towel bars as grab  bars. Use non-skid mats or decals in the tub or shower. If you need to sit down in the shower, use a plastic, non-slip stool. Keep the floor dry. Clean up any water that spills on the floor as soon as it happens. Remove soap buildup in the tub or shower regularly. Attach bath mats securely with double-sided non-slip rug tape. Do not have throw rugs and other things on the floor that can make you trip. What can I do in the bedroom? Use night lights. Make sure that you have a light by your bed that is easy to reach. Do not use any sheets or blankets that are too big for your bed. They should not hang down onto the floor. Have a firm chair that has side arms. You can use this for support while you get dressed. Do not have throw rugs and other things on the floor that can make you trip. What can I do in the kitchen? Clean up any spills right away. Avoid walking on wet floors. Keep items that you use a lot in  easy-to-reach places. If you need to reach something above you, use a strong step stool that has a grab bar. Keep electrical cords out of the way. Do not use floor polish or wax that makes floors slippery. If you must use wax, use non-skid floor wax. Do not have throw rugs and other things on the floor that can make you trip. What can I do with my stairs? Do not leave any items on the stairs. Make sure that there are handrails on both sides of the stairs and use them. Fix handrails that are broken or loose. Make sure that handrails are as long as the stairways. Check any carpeting to make sure that it is firmly attached to the stairs. Fix any carpet that is loose or worn. Avoid having throw rugs at the top or bottom of the stairs. If you do have throw rugs, attach them to the floor with carpet tape. Make sure that you have a light switch at the top of the stairs and the bottom of the stairs. If you do not have them, ask someone to add them for you. What else can I do to help prevent  falls? Wear shoes that: Do not have high heels. Have rubber bottoms. Are comfortable and fit you well. Are closed at the toe. Do not wear sandals. If you use a stepladder: Make sure that it is fully opened. Do not climb a closed stepladder. Make sure that both sides of the stepladder are locked into place. Ask someone to hold it for you, if possible. Clearly mark and make sure that you can see: Any grab bars or handrails. First and last steps. Where the edge of each step is. Use tools that help you move around (mobility aids) if they are needed. These include: Canes. Walkers. Scooters. Crutches. Turn on the lights when you go into a dark area. Replace any light bulbs as soon as they burn out. Set up your furniture so you have a clear path. Avoid moving your furniture around. If any of your floors are uneven, fix them. If there are any pets around you, be aware of where they are. Review your medicines with your doctor. Some medicines can make you feel dizzy. This can increase your chance of falling. Ask your doctor what other things that you can do to help prevent falls. This information is not intended to replace advice given to you by your health care provider. Make sure you discuss any questions you have with your health care provider. Document Released: 01/12/2009 Document Revised: 08/24/2015 Document Reviewed: 04/22/2014 Elsevier Interactive Patient Education  2017 [employer].

## 2021-04-27 ENCOUNTER — Telehealth: Payer: Self-pay | Admitting: Adult Health

## 2021-04-27 DIAGNOSIS — R2689 Other abnormalities of gait and mobility: Secondary | ICD-10-CM

## 2021-04-27 DIAGNOSIS — Z7409 Other reduced mobility: Secondary | ICD-10-CM

## 2021-04-27 DIAGNOSIS — G8929 Other chronic pain: Secondary | ICD-10-CM

## 2021-04-27 NOTE — Telephone Encounter (Signed)
Okay for referral?

## 2021-04-27 NOTE — Telephone Encounter (Signed)
A New Referral is needed for Physical and Occupational Therapy to [facility].   Fax: [phone]

## 2021-04-30 NOTE — Telephone Encounter (Signed)
Left message to return phone call.

## 2021-04-30 NOTE — Telephone Encounter (Signed)
Tish is calling to return [place] phone number.  Tish could be contacted at [phone]  Please advise.

## 2021-04-30 NOTE — Telephone Encounter (Signed)
Called [facility] to see what [facility] was for no answer. lm

## 2021-05-01 NOTE — Telephone Encounter (Signed)
Spoke to [employer] confirmed fax number orders will be faxed.

## 2021-05-01 NOTE — Telephone Encounter (Signed)
Fax confirmation received. 

## 2021-05-15 NOTE — Progress Notes (Signed)
Zach Johnetta Sloniker [facility] [address] [place] Phone: [phone] Subjective:   IVilma Garza, am serving as a scribe for Dr. [name]. This visit occurred during the SARS-CoV-2 public health emergency.  Safety protocols were in place, including screening questions prior to the visit, additional usage of staff PPE, and extensive cleaning of exam room while observing appropriate contact time as indicated for disinfecting solutions.   I'm seeing this patient by the request  of:  Nafziger, [name], NP  CC: Left shoulder pain  UTM:[id number]  Sara Garza is a 88 y.o. female coming in with complaint of shoulder pain. Shoulder pain for about a year. Right shoulder constant sharp pain. Takes tylenol to sleep. 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 Drained twice before. Jaffe gave her 2 injections. Chevis Pretty drained twice and injected gel once. Sent off for labs no infection.  Patient unfortunately has not made any improvement.  Continues to have pain on a daily basis, continues to wake her up at night.  Wants to avoid any type of surgical intervention which would include replacement.       Past Medical History:  Diagnosis Date   Arthritis    CAD (coronary artery disease)    Carotid stenosis, left    50-60% stable   Diabetes mellitus    Glaucoma    Hyperlipidemia    Hypertension    Leg pain    ABIs 2/18: normal bilaterally.   Osteoporosis    Valvular heart disease    Aortic Stenosis s/p [name] 2014 // Mitral stenosis // Echo 09/2018: EF > 65, mod LVH, severe focal basal hypertrophy, RVSP 39.8, mild to mod MR, mod to severe MS (mean 9), AVR with mild AI, severe LAE (similar to prior echo)   Past Surgical History:  Procedure Laterality Date   ANOMALOUS PULMONARY VENOUS RETURN REPAIR, TOTAL     APPENDECTOMY     CARDIAC VALVE REPLACEMENT  04/29/2012   Cataracts Bilateral    CESAREAN SECTION     FOOT SURGERY     [name]   HIP ARTHROPLASTY Right  07/31/2013   Procedure: ARTHROPLASTY BIPOLAR HIP;  Surgeon: [name], MD;  Location: [facility];  Service: Orthopedics;  Laterality: Right;   INTRAMEDULLARY (IM) NAIL INTERTROCHANTERIC Left 01/01/2020   Procedure: INTRAMEDULLARY (IM) NAIL INTERTROCHANTRIC;  Surgeon: Rod Can, MD;  Location: [facility];  Service: Orthopedics;  Laterality: Left;   PERCUTANEOUS CORONARY STENT INTERVENTION (PCI-S) N/A 01/02/2012   Procedure: PERCUTANEOUS CORONARY STENT INTERVENTION (PCI-S);  Surgeon: Sherren Mocha, MD;  Location: [facility];  Service: Cardiovascular;  Laterality: N/A;   Social History   Socioeconomic History   Marital status: Widowed    Spouse name: Not on file   Number of children: Not on file   Years of education: Not on file   Highest education level: Not on file  Occupational History   Not on file  Tobacco Use   Smoking status: Never    Passive exposure: Yes   Smokeless tobacco: Never  Substance and Sexual Activity   Alcohol use: Yes    Alcohol/week: 0.0 standard drinks    Comment: very seldom   Drug use: No   Sexual activity: Not Currently  Other Topics Concern   Not on file  Social History Narrative   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      She likes to read and she takes  classes at [employer] Strain: Low Risk    Difficulty of Paying Living Expenses: Not hard at all  Food Insecurity: No Food Insecurity   Worried About Charity fundraiser in the Last Year: Never true   Arboriculturist in the Last Year: Never true  Transportation Needs: No Transportation Needs   Lack of Transportation (Medical): No   Lack of Transportation (Non-Medical): No  Physical Activity: Inactive   Days of Exercise per Week: 0 days   Minutes of Exercise per Session: 0 min  Stress: No Stress Concern Present   Feeling of Stress : Not at all  Social Connections: Moderately Integrated   Frequency of  Communication with Friends and Family: More than three times a week   Frequency of Social Gatherings with Friends and Family: More than three times a week   Attends Religious Services: More than 4 times per year   Active Member of [employer] or Organizations: Yes   Attends Archivist Meetings: More than 4 times per year   Marital Status: Widowed   Allergies  Allergen Reactions   Statins Other (See Comments)    Muscle aches   Gabapentin Other (See Comments)    SOMNOLENCE   Family History  Problem Relation Age of Onset   COPD Father    Cancer Father        lung cancer   Lung cancer Other      Current Outpatient Medications (Cardiovascular):    amLODipine (NORVASC) 5 MG tablet, TAKE 1 TABLET(5 MG) BY MOUTH DAILY   carvedilol (COREG) 3.125 MG tablet, TAKE 1 TABLET(3.125 MG) BY MOUTH TWICE DAILY   ezetimibe (ZETIA) 10 MG tablet, TAKE 1 TABLET(10 MG) BY MOUTH DAILY   furosemide (LASIX) 20 MG tablet, Take 1 tablet (20 mg total) by mouth daily.   Current Outpatient Medications (Analgesics):    acetaminophen (TYLENOL) 500 MG tablet, Take 2 tablets (1,000 mg total) by mouth 4 (four) times daily.   aspirin EC 81 MG tablet, Take 81 mg by mouth daily. Swallow whole.   Current Outpatient Medications (Other):    amoxicillin (AMOXIL) 500 MG capsule, TAKE 4 CAPSULES BY MOUTH ONE HOUR PRIOR TO DENTAL PROCEDURES   bimatoprost (LUMIGAN) 0.01 % SOLN, Place 1 drop into both eyes at bedtime.    Calcium Carb-Cholecalciferol 500-400 MG-UNIT TABS, Take 1 tablet by mouth daily.   COVID-19 mRNA bivalent vaccine, Pfizer, (PFIZER COVID-19 VAC BIVALENT) injection, Inject into the muscle.   diclofenac Sodium (VOLTAREN) 1 % GEL, Apply 2 g topically 4 (four) times daily.   Multiple Vitamin (MULTIVITAMIN) tablet, Take 1 tablet by mouth daily.   Multiple Vitamins-Minerals (PRESERVISION AREDS 2+MULTI VIT PO), Take 2 capsules by mouth in the morning and at bedtime.   Reviewed prior external information  including notes and imaging from  primary care provider As well as notes that were available from care everywhere and other healthcare systems.  Past medical history, social, surgical and family history all reviewed in electronic medical record.  No pertanent information unless stated regarding to the chief complaint.   Review of Systems:  No headache, visual changes, nausea, vomiting, diarrhea, constipation, dizziness, abdominal pain, skin rash, fevers, chills, night sweats, weight loss, swollen lymph nodes,  chest pain, shortness of breath, mood changes. POSITIVE muscle aches, body aches, joint swelling  Objective  Blood pressure 130/62, pulse 88, height 4\' 11"  (1.499 m), weight 143 lb (64.9 kg), SpO2 96 %.  General: No apparent distress alert and oriented x3 mood and affect normal, dressed appropriately.  HEENT: Pupils equal, extraocular movements intact  Respiratory: Patient's speak in full sentences and does not appear short of breath  Cardiovascular: 2+ lower extremity edema, non tender, no erythema  Gait severely antalgic walking with the aid of a walker MSK: Right shoulder does have an effusion noted.  Patient has significant crepitus with any type of range of motion.  No significant weakness with 3 out of 5 strength.  Procedure: Real-time Ultrasound Guided Injection of right shoulder Device: GE Logiq Q7 Ultrasound guided injection is preferred based studies that show increased duration, increased effect, greater accuracy, decreased procedural pain, increased response rate, and decreased cost with ultrasound guided versus blind injection.  Verbal informed consent obtained.  Time-out conducted.  Noted no overlying erythema, induration, or other signs of local infection.  Skin prepped in a sterile fashion.  Local anesthesia: Topical Ethyl chloride.  With sterile technique and under real time ultrasound guidance: With a 21-gauge 2 inch needle.  A anterior approach patient was  injected with 2 cc of 0.5% Marcaine and aspirated 35 cc of straw light-colored fluid.  Injected 1 cc of Kenalog 40 mg/mL. Completed without difficulty  Pain immediately improved suggesting accurate placement of the medication.  Advised to call if fevers/chills, erythema, induration, drainage, or persistent bleeding.  Impression: Technically successful ultrasound guided injection.    Impression and Recommendations:     The above documentation has been reviewed and is accurate and complete Lyndal Pulley, DO

## 2021-05-16 ENCOUNTER — Ambulatory Visit (INDEPENDENT_AMBULATORY_CARE_PROVIDER_SITE_OTHER): Payer: Medicare Other | Admitting: Family Medicine

## 2021-05-16 ENCOUNTER — Ambulatory Visit: Payer: Self-pay

## 2021-05-16 ENCOUNTER — Other Ambulatory Visit: Payer: Self-pay

## 2021-05-16 ENCOUNTER — Encounter: Payer: Self-pay | Admitting: Family Medicine

## 2021-05-16 VITALS — BP 130/62 | HR 88 | Ht 59.0 in | Wt 143.0 lb

## 2021-05-16 DIAGNOSIS — M75101 Unspecified rotator cuff tear or rupture of right shoulder, not specified as traumatic: Secondary | ICD-10-CM | POA: Diagnosis not present

## 2021-05-16 DIAGNOSIS — M25511 Pain in right shoulder: Secondary | ICD-10-CM | POA: Diagnosis not present

## 2021-05-16 DIAGNOSIS — M12811 Other specific arthropathies, not elsewhere classified, right shoulder: Secondary | ICD-10-CM | POA: Diagnosis not present

## 2021-05-16 DIAGNOSIS — I25119 Atherosclerotic heart disease of native coronary artery with unspecified angina pectoris: Secondary | ICD-10-CM | POA: Diagnosis not present

## 2021-05-16 NOTE — Patient Instructions (Addendum)
Drained shoulder Arnica lotion Vit D 2000iu and Tart Cherry 1200mg  See you again in 5-6 weeks

## 2021-05-16 NOTE — Assessment & Plan Note (Addendum)
Aspiration done today, tolerated the procedure well, discussed icing regimen and home exercises, discussed which activities to do and which ones to avoid.  We discussed secondary to patient's age as well as needing the walker for her ambulation is shoulder replacement would be significant and would likely need to be in a rehab facility.  Patient is concerned that she would never come out of the rehab facility.  We discussed we can repeat the injection every 10 weeks if necessary.  Increase activity as tolerated.  Follow-up with me again in 6 to 8 weeks

## 2021-05-30 DIAGNOSIS — H43813 Vitreous degeneration, bilateral: Secondary | ICD-10-CM | POA: Diagnosis not present

## 2021-05-30 DIAGNOSIS — H353132 Nonexudative age-related macular degeneration, bilateral, intermediate dry stage: Secondary | ICD-10-CM | POA: Diagnosis not present

## 2021-06-12 ENCOUNTER — Ambulatory Visit (INDEPENDENT_AMBULATORY_CARE_PROVIDER_SITE_OTHER): Payer: Medicare Other | Admitting: Adult Health

## 2021-06-12 ENCOUNTER — Encounter: Payer: Self-pay | Admitting: Adult Health

## 2021-06-12 VITALS — BP 140/70 | HR 87 | Ht 59.0 in | Wt 143.0 lb

## 2021-06-12 DIAGNOSIS — L03116 Cellulitis of left lower limb: Secondary | ICD-10-CM | POA: Diagnosis not present

## 2021-06-12 DIAGNOSIS — I25119 Atherosclerotic heart disease of native coronary artery with unspecified angina pectoris: Secondary | ICD-10-CM | POA: Diagnosis not present

## 2021-06-12 MED ORDER — DOXYCYCLINE HYCLATE 100 MG PO CAPS: 100.0000 mg | ORAL_CAPSULE | Freq: Two times a day (BID) | ORAL | 0 refills | Status: DC

## 2021-06-12 NOTE — Progress Notes (Signed)
? ?Subjective:  ? ? Patient ID: Sara Garza, female    DOB: 09/05/1923, 88 y.o.   MRN: [id number] ? ?HPI ? ?88 year old female who  has a past medical history of Arthritis, CAD (coronary artery disease), Carotid stenosis, left, Diabetes mellitus, Glaucoma, Hyperlipidemia, Hypertension, Leg pain, Osteoporosis, and Valvular heart disease. ? ?She presents to the office today for an acute issue. She reports that about 11 days ago her left leg accidentally got slammed in a car door. She initially had bruising and swelling that has improved " tremendously" but has developed redness, warmth, and pain along the lower shin bone.  ? ?No pain with ambulation  ? ? ?Review of Systems ?See HPI  ? ?Past Medical History:  ?Diagnosis Date  ? Arthritis   ? CAD (coronary artery disease)   ? Carotid stenosis, left   ? 50-60% stable  ? Diabetes mellitus   ? Glaucoma   ? Hyperlipidemia   ? Hypertension   ? Leg pain   ? ABIs 2/18: normal bilaterally.  ? Osteoporosis   ? Valvular heart disease   ? Aortic Stenosis s/p [name] 2014 // Mitral stenosis // Echo 09/2018: EF > 65, mod LVH, severe focal basal hypertrophy, RVSP 39.8, mild to mod MR, mod to severe MS (mean 9), AVR with mild AI, severe LAE (similar to prior echo)  ? ? ?Social History  ? ?Socioeconomic History  ? Marital status: Widowed  ?  Spouse name: Not on file  ? Number of children: Not on file  ? Years of education: Not on file  ? Highest education level: Not on file  ?Occupational History  ? Not on file  ?Tobacco Use  ? Smoking status: Never  ?  Passive exposure: Yes  ? Smokeless tobacco: Never  ?Substance and Sexual Activity  ? Alcohol use: Yes  ?  Alcohol/week: 0.0 standard drinks  ?  Comment: very seldom  ? Drug use: No  ? Sexual activity: Not Currently  ?Other Topics Concern  ? Not on file  ?Social History Narrative  ? She worked as a Network engineer   ? Widow for 10 years   ? Has one daughter  ?   ? She likes to read and she takes classes at [facility] [employer]  ? ?Social Determinants  of Health  ? ?Financial Resource Strain: Low Risk   ? Difficulty of Paying Living Expenses: Not hard at all  ?Food Insecurity: No Food Insecurity  ? Worried About Charity fundraiser in the Last Year: Never true  ? Ran Out of Food in the Last Year: Never true  ?Transportation Needs: No Transportation Needs  ? Lack of Transportation (Medical): No  ? Lack of Transportation (Non-Medical): No  ?Physical Activity: Inactive  ? Days of Exercise per Week: 0 days  ? Minutes of Exercise per Session: 0 min  ?Stress: No Stress Concern Present  ? Feeling of Stress : Not at all  ?Social Connections: Moderately Integrated  ? Frequency of Communication with Friends and Family: More than three times a week  ? Frequency of Social Gatherings with Friends and Family: More than three times a week  ? Attends Religious Services: More than 4 times per year  ? Active Member of Clubs or Organizations: Yes  ? Attends Archivist Meetings: More than 4 times per year  ? Marital Status: Widowed  ?Intimate Partner Violence: Not At Risk  ? Fear of Current or Ex-Partner: No  ? Emotionally Abused: No  ?  Physically Abused: No  ? Sexually Abused: No  ? ? ?Past Surgical History:  ?Procedure Laterality Date  ? ANOMALOUS PULMONARY VENOUS RETURN REPAIR, TOTAL    ? APPENDECTOMY    ? CARDIAC VALVE REPLACEMENT  04/29/2012  ? Cataracts Bilateral   ? CESAREAN SECTION    ? FOOT SURGERY    ? [name]  ? HIP ARTHROPLASTY Right 07/31/2013  ? Procedure: ARTHROPLASTY BIPOLAR HIP;  Surgeon: [name], MD;  Location: [facility];  Service: Orthopedics;  Laterality: Right;  ? INTRAMEDULLARY (IM) NAIL INTERTROCHANTERIC Left 01/01/2020  ? Procedure: INTRAMEDULLARY (IM) NAIL INTERTROCHANTRIC;  Surgeon: Rod Can, MD;  Location: [facility];  Service: Orthopedics;  Laterality: Left;  ? PERCUTANEOUS CORONARY STENT INTERVENTION (PCI-S) N/A 01/02/2012  ? Procedure: PERCUTANEOUS CORONARY STENT INTERVENTION (PCI-S);  Surgeon: Sherren Mocha, MD;  Location: [facility];   Service: Cardiovascular;  Laterality: N/A;  ? ? ?Family History  ?Problem Relation Age of Onset  ? COPD Father   ? Cancer Father   ?     lung cancer  ? Lung cancer Other   ? ? ?Allergies  ?Allergen Reactions  ? Statins Other (See Comments)  ?  Muscle aches  ? Gabapentin Other (See Comments)  ?  SOMNOLENCE  ? ? ?Current Outpatient Medications on File Prior to Visit  ?Medication Sig Dispense Refill  ? acetaminophen (TYLENOL) 500 MG tablet Take 2 tablets (1,000 mg total) by mouth 4 (four) times daily. 30 tablet 0  ? aspirin EC 81 MG tablet Take 81 mg by mouth daily. Swallow whole.    ? bimatoprost (LUMIGAN) 0.01 % SOLN Place 1 drop into both eyes at bedtime.     ? Calcium Carb-Cholecalciferol 500-400 MG-UNIT TABS Take 1 tablet by mouth daily.    ? carvedilol (COREG) 3.125 MG tablet TAKE 1 TABLET(3.125 MG) BY MOUTH TWICE DAILY 180 tablet 1  ? COVID-19 mRNA bivalent vaccine, Pfizer, (PFIZER COVID-19 VAC BIVALENT) injection Inject into the muscle. 0.3 mL 0  ? ezetimibe (ZETIA) 10 MG tablet TAKE 1 TABLET(10 MG) BY MOUTH DAILY 90 tablet 1  ? furosemide (LASIX) 20 MG tablet Take 1 tablet (20 mg total) by mouth daily. 90 tablet 1  ? Misc Natural Products (TART CHERRY ADVANCED PO) Take by mouth.    ? Multiple Vitamin (MULTIVITAMIN) tablet Take 1 tablet by mouth daily.    ? Multiple Vitamins-Minerals (PRESERVISION AREDS 2+MULTI VIT PO) Take 2 capsules by mouth in the morning and at bedtime.    ? ?No current facility-administered medications on file prior to visit.  ? ? ?BP 140/70   Pulse 87   Ht '4\' 11"'$  (1.499 m)   Wt 143 lb (64.9 kg)   SpO2 97%   BMI 28.88 kg/m?  ? ? ?   ?Objective:  ? Physical Exam ?Vitals and nursing note reviewed.  ?Constitutional:   ?   Appearance: Normal appearance.  ?Cardiovascular:  ?   Rate and Rhythm: Normal rate and regular rhythm.  ?Musculoskeletal:     ?   General: Normal range of motion.  ?Skin: ?   General: Skin is warm and dry.  ?   Findings: Bruising and erythema present.  ?   Comments:  Localized erythema, warmth, and pain  noted along lower left shin. No streaking noted.   ?Neurological:  ?   General: No focal deficit present.  ?   Mental Status: She is alert and oriented to person, place, and time.  ?Psychiatric:     ?  Mood and Affect: Mood normal.     ?   Behavior: Behavior normal.     ?   Thought Content: Thought content normal.     ?   Judgment: Judgment normal.  ? ?   ?Assessment & Plan:  ?1. Cellulitis of left lower extremity ? ?- doxycycline (VIBRAMYCIN) 100 MG capsule; Take 1 capsule (100 mg total) by mouth 2 (two) times daily.  Dispense: 20 capsule; Refill: 0 ?- Follow up in one week or sooner if needed ? ?[name], NP ? ? ?

## 2021-06-12 NOTE — Patient Instructions (Addendum)
It was great seeing you today  ? ?It looks like you have cellulitis  ? ?I am going to put you on Doxycycline twice a day for 10 days  ? ?Follow up in 10 days  ? ? ?

## 2021-06-13 ENCOUNTER — Telehealth: Payer: Self-pay | Admitting: *Deleted

## 2021-06-13 NOTE — Chronic Care Management (AMB) (Signed)
?  Care Management  ? ?Note ? ?06/13/2021 ?Name: MARINNA BLANE MRN: [id number] DOB: [birth year] ? ?Deloros Beretta Sorbello is a 88 y.o. year old female who is a primary care patient of Nafziger, [name], NP and is actively engaged with the care management team. I reached out to [name] by phone today to assist with scheduling an initial visit with the RN Case Manager ? ?Follow up plan: ?Telephone appointment with care management team member scheduled for:06/25/21 ? ?Laverda Sorenson  ?Care Guide, Embedded Care Coordination ?[facility]  Care Management  ?Direct Dial: [phone] ? ?

## 2021-06-13 NOTE — Chronic Care Management (AMB) (Signed)
?  Care Management  ? ?Note ? ?06/13/2021 ?Name: Sara Garza MRN: [id number] DOB: 30-Oct-1923 ? ?Sara Garza is a 88 y.o. year old female who is a primary care patient of Nafziger, [name], NP and is actively engaged with the care management team. I reached out to [name] by phone today to assist with scheduling an initial visit with the RN Case Manager ? ?Follow up plan: ?Unsuccessful telephone outreach attempt made. A HIPAA compliant phone message was left for the patient providing contact information and requesting a return call.  ?The care management team will reach out to the patient again over the next 7 days.  ?If patient returns call to provider office, please advise to call [facility] at [phone]. ? ?Laverda Sorenson  ?Care Guide, Embedded Care Coordination ?[facility]  Care Management  ?Direct Dial: [phone] ? ?

## 2021-06-15 ENCOUNTER — Encounter: Payer: Self-pay | Admitting: Adult Health

## 2021-06-15 ENCOUNTER — Ambulatory Visit (INDEPENDENT_AMBULATORY_CARE_PROVIDER_SITE_OTHER): Payer: Medicare Other | Admitting: Adult Health

## 2021-06-15 VITALS — BP 138/60 | HR 79 | Temp 98.1°F

## 2021-06-15 DIAGNOSIS — I25119 Atherosclerotic heart disease of native coronary artery with unspecified angina pectoris: Secondary | ICD-10-CM | POA: Diagnosis not present

## 2021-06-15 DIAGNOSIS — R1310 Dysphagia, unspecified: Secondary | ICD-10-CM

## 2021-06-15 DIAGNOSIS — L03116 Cellulitis of left lower limb: Secondary | ICD-10-CM | POA: Diagnosis not present

## 2021-06-15 MED ORDER — METHYLPREDNISOLONE ACETATE 80 MG/ML IJ SUSP
80.0000 mg | Freq: Once | INTRAMUSCULAR | Status: AC
  Administered 2021-06-15: 80 mg via INTRAMUSCULAR

## 2021-06-15 MED ORDER — METHYLPREDNISOLONE ACETATE 80 MG/ML IJ SUSP: 80.0000 mg | Freq: Once | INTRAMUSCULAR | Status: DC

## 2021-06-15 NOTE — Progress Notes (Signed)
? ?Subjective:  ? ? Patient ID: Sara Garza, female    DOB: Nov 21, 1923, 88 y.o.   MRN: [id number] ? ?HPI ?88 year old female who  has a past medical history of Arthritis, CAD (coronary artery disease), Carotid stenosis, left, Diabetes mellitus, Glaucoma, Hyperlipidemia, Hypertension, Leg pain, Osteoporosis, and Valvular heart disease. ? ?She was seen 3 days ago for suspected cellulitis of her left lower extremity.  She was placed on doxycycline 100 mg twice daily.  She reports that the day after starting her doxycycline she developed difficulty swallowing liquids and foods.  He did not have this prior to starting the antibiotics.  She denies difficulty speaking, breathing, and has not noticed any swelling of her lips or tongue.  She states "I have a knot in my throat". ? ?He has taken a total of 6 doses of antibiotic, last dose was this morning ? ?She has noticed noticeable improvement in her cellulitis. ? ? ?Review of Systems ?See HPI  ? ?Past Medical History:  ?Diagnosis Date  ? Arthritis   ? CAD (coronary artery disease)   ? Carotid stenosis, left   ? 50-60% stable  ? Diabetes mellitus   ? Glaucoma   ? Hyperlipidemia   ? Hypertension   ? Leg pain   ? ABIs 2/18: normal bilaterally.  ? Osteoporosis   ? Valvular heart disease   ? Aortic Stenosis s/p [name] 2014 // Mitral stenosis // Echo 09/2018: EF > 65, mod LVH, severe focal basal hypertrophy, RVSP 39.8, mild to mod MR, mod to severe MS (mean 9), AVR with mild AI, severe LAE (similar to prior echo)  ? ? ?Social History  ? ?Socioeconomic History  ? Marital status: Widowed  ?  Spouse name: Not on file  ? Number of children: Not on file  ? Years of education: Not on file  ? Highest education level: Not on file  ?Occupational History  ? Not on file  ?Tobacco Use  ? Smoking status: Never  ?  Passive exposure: Yes  ? Smokeless tobacco: Never  ?Substance and Sexual Activity  ? Alcohol use: Yes  ?  Alcohol/week: 0.0 standard drinks  ?  Comment: very seldom  ? Drug use: No   ? Sexual activity: Not Currently  ?Other Topics Concern  ? Not on file  ?Social History Narrative  ? She worked as a Network engineer   ? Widow for 10 years   ? Has one daughter  ?   ? She likes to read and she takes classes at [employer]  ? ?Social Determinants of Health  ? ?Financial Resource Strain: Low Risk   ? Difficulty of Paying Living Expenses: Not hard at all  ?Food Insecurity: No Food Insecurity  ? Worried About Charity fundraiser in the Last Year: Never true  ? Ran Out of Food in the Last Year: Never true  ?Transportation Needs: No Transportation Needs  ? Lack of Transportation (Medical): No  ? Lack of Transportation (Non-Medical): No  ?Physical Activity: Inactive  ? Days of Exercise per Week: 0 days  ? Minutes of Exercise per Session: 0 min  ?Stress: No Stress Concern Present  ? Feeling of Stress : Not at all  ?Social Connections: Moderately Integrated  ? Frequency of Communication with Friends and Family: More than three times a week  ? Frequency of Social Gatherings with Friends and Family: More than three times a week  ? Attends Religious Services: More than 4 times per year  ? Active  Member of Clubs or Organizations: Yes  ? Attends Archivist Meetings: More than 4 times per year  ? Marital Status: Widowed  ?Intimate Partner Violence: Not At Risk  ? Fear of Current or Ex-Partner: No  ? Emotionally Abused: No  ? Physically Abused: No  ? Sexually Abused: No  ? ? ?Past Surgical History:  ?Procedure Laterality Date  ? ANOMALOUS PULMONARY VENOUS RETURN REPAIR, TOTAL    ? APPENDECTOMY    ? CARDIAC VALVE REPLACEMENT  04/29/2012  ? Cataracts Bilateral   ? CESAREAN SECTION    ? FOOT SURGERY    ? [name]  ? HIP ARTHROPLASTY Right 07/31/2013  ? Procedure: ARTHROPLASTY BIPOLAR HIP;  Surgeon: [name], MD;  Location: [facility];  Service: Orthopedics;  Laterality: Right;  ? INTRAMEDULLARY (IM) NAIL INTERTROCHANTERIC Left 01/01/2020  ? Procedure: INTRAMEDULLARY (IM) NAIL INTERTROCHANTRIC;  Surgeon:  Rod Can, MD;  Location: [facility];  Service: Orthopedics;  Laterality: Left;  ? PERCUTANEOUS CORONARY STENT INTERVENTION (PCI-S) N/A 01/02/2012  ? Procedure: PERCUTANEOUS CORONARY STENT INTERVENTION (PCI-S);  Surgeon: Sherren Mocha, MD;  Location: [facility];  Service: Cardiovascular;  Laterality: N/A;  ? ? ?Family History  ?Problem Relation Age of Onset  ? COPD Father   ? Cancer Father   ?     lung cancer  ? Lung cancer Other   ? ? ?Allergies  ?Allergen Reactions  ? Statins Other (See Comments)  ?  Muscle aches  ? Gabapentin Other (See Comments)  ?  SOMNOLENCE  ? ? ?Current Outpatient Medications on File Prior to Visit  ?Medication Sig Dispense Refill  ? acetaminophen (TYLENOL) 500 MG tablet Take 2 tablets (1,000 mg total) by mouth 4 (four) times daily. 30 tablet 0  ? aspirin EC 81 MG tablet Take 81 mg by mouth daily. Swallow whole.    ? bimatoprost (LUMIGAN) 0.01 % SOLN Place 1 drop into both eyes at bedtime.     ? Calcium Carb-Cholecalciferol 500-400 MG-UNIT TABS Take 1 tablet by mouth daily.    ? carvedilol (COREG) 3.125 MG tablet TAKE 1 TABLET(3.125 MG) BY MOUTH TWICE DAILY 180 tablet 1  ? COVID-19 mRNA bivalent vaccine, Pfizer, (PFIZER COVID-19 VAC BIVALENT) injection Inject into the muscle. 0.3 mL 0  ? doxycycline (VIBRAMYCIN) 100 MG capsule Take 1 capsule (100 mg total) by mouth 2 (two) times daily. 20 capsule 0  ? ezetimibe (ZETIA) 10 MG tablet TAKE 1 TABLET(10 MG) BY MOUTH DAILY 90 tablet 1  ? furosemide (LASIX) 20 MG tablet Take 1 tablet (20 mg total) by mouth daily. 90 tablet 1  ? Misc Natural Products (TART CHERRY ADVANCED PO) Take by mouth.    ? Multiple Vitamin (MULTIVITAMIN) tablet Take 1 tablet by mouth daily.    ? Multiple Vitamins-Minerals (PRESERVISION AREDS 2+MULTI VIT PO) Take 2 capsules by mouth in the morning and at bedtime.    ? ?No current facility-administered medications on file prior to visit.  ? ? ?BP 138/60   Pulse 79   Temp 98.1 ?F (36.7 ?C)   SpO2 95%  ? ? ?   ?Objective:  ?  Physical Exam ?Vitals and nursing note reviewed.  ?Constitutional:   ?   Appearance: Normal appearance.  ?HENT:  ?   Mouth/Throat:  ?   Mouth: Mucous membranes are moist.  ?   Pharynx: Oropharynx is clear. No oropharyngeal exudate or posterior oropharyngeal erythema.  ?   Comments: No thrush noted  ?Cardiovascular:  ?   Rate and Rhythm:  Normal rate and regular rhythm.  ?   Pulses: Normal pulses.  ?   Heart sounds: Normal heart sounds.  ?Pulmonary:  ?   Effort: Pulmonary effort is normal.  ?   Breath sounds: Normal breath sounds.  ?Skin: ?   General: Skin is warm and dry.  ?Neurological:  ?   General: No focal deficit present.  ?   Mental Status: She is alert and oriented to person, place, and time.  ?Psychiatric:     ?   Mood and Affect: Mood normal.     ?   Behavior: Behavior normal.     ?   Thought Content: Thought content normal.     ?   Judgment: Judgment normal.  ? ? ?   ?Assessment & Plan:  ?1. Dysphagia, unspecified type ?-Due to concern of allergic reaction from antibiotics.  We will have her stop her doxycycline over the weekend to see if her symptoms resolved.  We will give a Depo-Medrol injection in the office today.  She knows if she has difficulty speaking or breathing to go to the emergency room ?- methylPREDNISolone acetate (DEPO-MEDROL) injection 80 mg ? ?2. Cellulitis of left lower extremity ?-Looks almost completely healed.  Antibiotic should continue to work for the next 2 to 3 days.  We will keep an eye on this and follow-up next week ? ?[name], NP ? ? ? ?

## 2021-06-19 ENCOUNTER — Ambulatory Visit: Payer: Medicare Other | Admitting: Adult Health

## 2021-06-25 ENCOUNTER — Ambulatory Visit (INDEPENDENT_AMBULATORY_CARE_PROVIDER_SITE_OTHER): Payer: Medicare Other

## 2021-06-25 DIAGNOSIS — E785 Hyperlipidemia, unspecified: Secondary | ICD-10-CM

## 2021-06-25 DIAGNOSIS — I10 Essential (primary) hypertension: Secondary | ICD-10-CM

## 2021-06-25 DIAGNOSIS — I5032 Chronic diastolic (congestive) heart failure: Secondary | ICD-10-CM

## 2021-06-25 DIAGNOSIS — I2510 Atherosclerotic heart disease of native coronary artery without angina pectoris: Secondary | ICD-10-CM

## 2021-06-25 DIAGNOSIS — G8929 Other chronic pain: Secondary | ICD-10-CM

## 2021-06-25 NOTE — Patient Instructions (Signed)
Visit Information  ? ?Thank you for taking time to visit with me today. Please don't hesitate to contact me if I can be of assistance to you before our next scheduled telephone appointment. ? ?Following are the goals we discussed today:  ?Take all medications as prescribed ?Attend all scheduled provider appointments ?Call pharmacy for medication refills 3-7 days in advance of running out of medications ?Call provider office for new concerns or questions  ?call office if I gain more than 2 pounds in one day or 5 pounds in one week ?keep legs up while sitting ?use salt in moderation ?watch for swelling in feet, ankles and legs every day ?weigh myself daily ?follow rescue plan if symptoms flare-up ?eat more whole grains, fruits and vegetables, lean meats and healthy fats ?check blood pressure 3 times per week ?choose a place to take my blood pressure (home, clinic or office, retail store) ?keep a blood pressure log ?take blood pressure log to all doctor appointments ?limit salt intake to '2300mg'$ /day ?call for medicine refill 2 or 3 days before it runs out ?take all medications exactly as prescribed ?call doctor with any symptoms you believe are related to your medicine ?Heart Failure, Self-Care ?Heart failure is a serious condition. The following information explains things you need to do to take care of yourself at home. To help you stay as healthy as possible, you may be asked to change your diet, take certain medicines, and make other changes in your life. Your doctor may also give you more specific instructions. If you have problems or questions, call your doctor. ?What are the risks? ?Having heart failure makes it more likely for you to have some problems. These problems can get worse if you do not take good care of yourself. Problems may include: ?Damage to the kidneys, liver, or lungs. ?Malnutrition. ?Abnormal heart rhythms. ?Blood clotting problems that could cause a stroke. ?Supplies needed: ?Scale for weighing  yourself. ?Blood pressure monitor. ?Notebook. ?Medicines. ?How to care for yourself when you have heart failure ?Medicines ?Take over-the-counter and prescription medicines only as told by your doctor. Take your medicines every day. ?Do not stop taking your medicine unless your doctor tells you to do so. ?Do not skip any medicines. ?Get your prescriptions refilled before you run out of medicine. This is important. ?Talk with your doctor if you cannot afford your medicines. ?Eating and drinking ? ?Eat heart-healthy foods. Talk with a diet specialist (dietitian) to create an eating plan. ?Limit salt (sodium) if told by your doctor. Ask your diet specialist to tell you which seasonings are healthy for your heart. ?Cook in healthy ways instead of frying. Healthy ways of cooking include roasting, grilling, broiling, baking, poaching, steaming, and stir-frying. ?Choose foods that: ?Have no trans fat. ?Are low in saturated fat and cholesterol. ?Choose healthy foods, such as: ?Fresh or frozen fruits and vegetables. ?Fish. ?Low-fat (lean) meats. ?Legumes, such as beans, peas, and lentils. ?Fat-free or low-fat dairy products. ?Whole-grain foods. ?High-fiber foods. ?Limit how much fluid you drink, if told by your doctor. ?Alcohol use ?Do not drink alcohol if: ?Your doctor tells you not to drink. ?Your heart was damaged by alcohol, or you have very bad heart failure. ?You are pregnant, may be pregnant, or are planning to become pregnant. ?If you drink alcohol: ?Limit how much you have to: ?0-1 drink a day for women. ?0-2 drinks a day for men. ?Know how much alcohol is in your drink. In the [place] one drink equals one 12 oz  bottle of beer (355 mL), one 5 oz glass of wine (148 mL), or one 1? oz glass of hard liquor (44 mL). ?Lifestyle ? ?Do not smoke or use any products that contain nicotine or tobacco. If you need help quitting, ask your doctor. ?Do not use nicotine gum or patches before talking to your doctor. ?Do not use  illegal drugs. ?Lose weight if told by your doctor. ?Do physical activity if told by your doctor. Talk to your doctor before you begin an exercise if: ?You are an older adult. ?You have very bad heart failure. ?Learn to manage stress. If you need help, ask your doctor. ?Get physical rehab (rehabilitation) to help you stay independent and to help with your quality of life. ?Participate in a cardiac rehab program. This program helps you improve your health through exercise, education, and counseling. ?Plan time to rest when you get tired. ?Check weight and blood pressure ? ?Weigh yourself every day. This will help you to know if fluid is building up in your body. ?Weigh yourself every morning after you pee (urinate) and before you eat breakfast. ?Wear the same amount of clothing each time. ?Write down your daily weight. Give your record to your doctor. ?Check and write down your blood pressure as told by your doctor. ?Check your pulse as told by your doctor. ?Dealing with very hot and very cold weather ?If it is very hot: ?Avoid activities that take a lot of energy. ?Use air conditioning or fans, or find a cooler place. ?Avoid caffeine and alcohol. ?Wear clothing that is loose-fitting, lightweight, and light-colored. ?If it is very cold: ?Avoid activities that take a lot of energy. ?Layer your clothes. ?Wear mittens or gloves, a hat, and a face covering when you go outside. ?Avoid alcohol. ?Follow these instructions at home: ?Stay up to date with shots (vaccines). Get pneumococcal and flu (influenza) shots. ?Keep all follow-up visits. ?Contact a doctor if: ?You gain 2-3 lb (1-1.4 kg) in 24 hours or 5 lb (2.3 kg) in a week. ?You have increasing shortness of breath. ?You cannot do your normal activities. ?You get tired easily. ?You cough a lot. ?You do not feel like eating or feel like you may vomit (nauseous). ?You have swelling in your hands, feet, ankles, or belly (abdomen). ?You cannot sleep well because it is hard  to breathe. ?You feel like your heart is beating fast (palpitations). ?You get dizzy when you stand up. ?You feel depressed or sad. ?Get help right away if: ?You have trouble breathing. ?You or someone else notices a change in your behavior, such as having trouble staying awake. ?You have chest pain or discomfort. ?You pass out (faint). ?These symptoms may be an emergency. Get help right away. Call your local emergency services (911 in the [place] ?Do not wait to see if the symptoms will go away. ?Do not drive yourself to the hospital. ?Summary ?Heart failure is a serious condition. To care for yourself, you may have to change your diet, take medicines, and make other lifestyle changes. ?Take your medicines every day. Do not stop taking them unless your doctor tells you to do so. ?Limit salt and eat heart-healthy foods. ?Ask your doctor if you can drink alcohol. You may have to stop alcohol use if you have very bad heart failure. ?Contact your doctor if you gain weight quickly or feel that your heart is beating too fast. Get help right away if you pass out or have chest pain or trouble breathing. ?This information  is not intended to replace advice given to you by your health care provider. Make sure you discuss any questions you have with your health care provider. ?Document Revised: 10/09/2019 Document Reviewed: 10/09/2019 ?Elsevier Patient Education ? [place]. ? ?Our next appointment is by telephone on 08/20/21 at 1:15 PM ? ?Please call the care guide team at [phone] if you need to cancel or reschedule your appointment.  ? ?If you are experiencing a Mental Health or [place] or need someone to talk to, please call the Suicide and Crisis Lifeline: 988 ?call the [place] National Suicide Prevention Lifeline: 628-501-7897 or TTY: (947)251-1358 TTY (709) 377-5618) to talk to a trained counselor ?call 1-800-273-TALK (toll free, 24 hour hotline) ?go to [facility] [address], [place] [phone]) ?call 911  ? ?Following is a copy of your full care plan:  ?Care Plan : [place] of Care  ?Updates made by Dimitri Ped, RN since 06/25/2021

## 2021-06-25 NOTE — Chronic Care Management (AMB) (Signed)
?Chronic Care Management  ? ?CCM RN Visit Note ? ?06/25/2021 ?Name: Sara Garza MRN: [id number] DOB: [birth year] ? ?Subjective: ?Sara Garza is a 88 y.o. year old female who is a primary care patient of [name], NP. The care management team was consulted for assistance with disease management and care coordination needs.   ? ?Engaged with patient by telephone for initial visit in response to provider referral for case management and/or care coordination services.  ? ?Consent to Services:  ?The patient was given the following information about Chronic Care Management services today, agreed to services, and gave verbal consent: 1. CCM service includes personalized support from designated clinical staff supervised by the primary care provider, including individualized plan of care and coordination with other care providers 2. 24/7 contact phone numbers for assistance for urgent and routine care needs. 3. Service will only be billed when office clinical staff spend 20 minutes or more in a month to coordinate care. 4. Only one practitioner may furnish and bill the service in a calendar month. 5.The patient may stop CCM services at any time (effective at the end of the month) by phone call to the office staff. 6. The patient will be responsible for cost sharing (co-pay) of up to 20% of the service fee (after annual deductible is met). Patient agreed to services and consent obtained. ? ?Patient agreed to services and verbal consent obtained.  ? ?Assessment: Review of patient past medical history, allergies, medications, health status, including review of consultants reports, laboratory and other test data, was performed as part of comprehensive evaluation and provision of chronic care management services.  ? ?SDOH (Social Determinants of Health) assessments and interventions performed:  ?SDOH Interventions   ? ?Flowsheet Row Most Recent Value  ?SDOH Interventions   ?Financial Strain Interventions Intervention Not  Indicated  ?Housing Interventions Intervention Not Indicated  ?Stress Interventions Intervention Not Indicated  ?Transportation Interventions Intervention Not Indicated  ? ?  ?  ? ?[place] ? ?Allergies  ?Allergen Reactions  ? Statins Other (See Comments)  ?  Muscle aches  ? Gabapentin Other (See Comments)  ?  SOMNOLENCE  ? ? ?Outpatient Encounter Medications as of 06/25/2021  ?Medication Sig  ? acetaminophen (TYLENOL) 500 MG tablet Take 2 tablets (1,000 mg total) by mouth 4 (four) times daily.  ? aspirin EC 81 MG tablet Take 81 mg by mouth daily. Swallow whole.  ? bimatoprost (LUMIGAN) 0.01 % SOLN Place 1 drop into both eyes at bedtime.   ? Calcium Carb-Cholecalciferol 500-400 MG-UNIT TABS Take 1 tablet by mouth daily.  ? carvedilol (COREG) 3.125 MG tablet TAKE 1 TABLET(3.125 MG) BY MOUTH TWICE DAILY  ? COVID-19 mRNA bivalent vaccine, Pfizer, (PFIZER COVID-19 VAC BIVALENT) injection Inject into the muscle.  ? doxycycline (VIBRAMYCIN) 100 MG capsule Take 1 capsule (100 mg total) by mouth 2 (two) times daily.  ? ezetimibe (ZETIA) 10 MG tablet TAKE 1 TABLET(10 MG) BY MOUTH DAILY  ? furosemide (LASIX) 20 MG tablet Take 1 tablet (20 mg total) by mouth daily.  ? Misc Natural Products (TART CHERRY ADVANCED PO) Take by mouth.  ? Multiple Vitamin (MULTIVITAMIN) tablet Take 1 tablet by mouth daily.  ? Multiple Vitamins-Minerals (PRESERVISION AREDS 2+MULTI VIT PO) Take 2 capsules by mouth in the morning and at bedtime.  ? ?No facility-administered encounter medications on file as of 06/25/2021.  ? ? ?Patient Active Problem List  ? Diagnosis Date Noted  ? Right rotator cuff tear arthropathy 05/16/2021  ? Acute  CHF (congestive heart failure) ([place]) 02/14/2020  ? Acute blood loss anemia 01/03/2020  ? Eye irritation 01/01/2020  ? Mitral valve stenosis   ? Benign essential HTN   ? Chronic diastolic CHF (congestive heart failure) ([place])   ? Hip fracture ([place]) 12/30/2019  ? Closed fracture of femur, intertrochanteric, left, initial  encounter ([place])   ? Acute on chronic diastolic CHF (congestive heart failure) ([place]) 02/25/2018  ? S/P [name] (transcatheter aortic valve replacement) 02/25/2018  ? Leg pain   ? Hyperlipidemia   ? Glaucoma   ? Carotid stenosis, left   ? Arthritis   ? Urinary incontinence 02/12/2017  ? Chronic bilateral low back pain 08/12/2016  ? Spondylosis of lumbar joint 08/12/2016  ? Bilateral leg pain 08/12/2016  ? Neck mass 04/15/2015  ? Status post hip hemiarthroplasty 07/31/2013  ? CAD (coronary artery disease) 07/31/2013  ? CKD (chronic kidney disease), stage III ([place]) 07/31/2013  ? Left bundle branch block 07/31/2013  ? Severe calcific aortic stenosis 06/18/2010  ? Diabetic polyneuropathy ([place]) 04/16/2010  ? GLAUCOMA 01/06/2009  ? CHEST PAIN, ATYPICAL 12/16/2008  ? Bilateral shoulder pain 11/16/2007  ? OSTEOARTHRITIS 02/02/2007  ? Type 2 diabetes mellitus with renal manifestations, controlled ([place]) 10/24/2006  ? Dyslipidemia 10/02/2006  ? Essential hypertension 10/02/2006  ? ? ?Conditions to be addressed/monitored:CHF, CAD, HTN, HLD, and Osteoarthritis ? ?Care Plan : RN Care Manager Plan of Care  ?Updates made by Dimitri Ped, RN since 06/25/2021 12:00 AM  ?  ? ?Problem: Chronic Disease Management and Care Coordination Needs (CHF, CAD,HTN, HLD, osteoarthritis)   ?Priority: High  ?  ? ?Long-Range Goal: Establish Plan of Care for Chronic Disease Management Needs (CHF, CAD,HTN, HLD, Osteoarthritis)   ?Start Date: 06/25/2021  ?Expected End Date: 06/25/2022  ?Priority: High  ?Note:   ?Current Barriers:  ?Chronic Disease Management support and education needs related to CHF, CAD, HTN, HLD, and Osteoarthritis  ?States that she gets along good.  States her biggest issue is her rt shoulder pain.  States she does not want to have surgery at her age.  Denies any chest pains, shortness of breath or increased swelling in her legs.  States she does not weigh daily but does about once a week.  States her B/P was 140/80 the last time she  checked it.  States she has friends that help her shop and do errands and her daughter is very supportive.  States she really does try to use less salt in her food but she does like sweets.  States she does not have diabetes and does not do anything to treat it. ? ?RNCM Clinical Goal(s):  ?Patient will verbalize understanding of plan for management of CHF, CAD, HTN, HLD, and Osteoarthritis as evidenced by voiced adherence to plan of care ?verbalize basic understanding of  CHF, CAD, HTN, HLD, and Osteoarthritis disease process and self health management plan as evidenced by voiced understanding and teach back  ?take all medications exactly as prescribed and will call provider for medication related questions as evidenced by dispense report and pt verbalization  ?attend all scheduled medical appointments: Sports Medicine 06/27/21, Cardiology 10/03/21 as evidenced by medical records ?demonstrate Improved adherence to prescribed treatment plan for CHF, CAD, HTN, HLD, and Osteoarthritis as evidenced by readings within limits, voiced adherence to plan of care ?continue to work with RN Care Manager to address care management and care coordination needs related to  CHF, CAD, HTN, HLD, and Osteoarthritis as evidenced by adherence to CM Team Scheduled appointments  through collaboration with Consulting civil engineer, provider, and care team.  ? ?Interventions: ?1:1 collaboration with primary care provider regarding development and update of comprehensive plan of care as evidenced by provider attestation and co-signature ?Inter-disciplinary care team collaboration (see longitudinal plan of care) ?Evaluation of current treatment plan related to  self management and patient's adherence to plan as established by provider ? ? ?CAD Interventions: (Status:  New goal.) Long Term Goal ?Assessed understanding of CAD diagnosis ?Medications reviewed including medications utilized in CAD treatment plan ?Provided education on importance of blood  pressure control in management of CAD ?Provided education on Importance of limiting foods high in cholesterol ?Counseled on importance of regular laboratory monitoring as prescribed ?Reviewed Importance of

## 2021-06-26 NOTE — Progress Notes (Signed)
?Sara Garza D.O. ?[facility] ?Sara Garza ?Phone: [phone] ?Subjective:   ?I, Sara Garza, am serving as a scribe for Dr. Hulan Garza. ? ?This visit occurred during the SARS-CoV-2 public health emergency.  Safety protocols were in place, including screening questions prior to the visit, additional usage of staff PPE, and extensive cleaning of exam room while observing appropriate contact time as indicated for disinfecting solutions.  ? ? ?I'm seeing this patient by the request  of:  Sara Peng, NP ? ?CC:  ? ?[id number]  ?05/16/2021 ?Aspiration done today, tolerated the procedure well, discussed icing regimen and home exercises, discussed which activities to do and which ones to avoid.  We discussed secondary to patient's age as well as needing the walker for her ambulation is shoulder replacement would be significant and would likely need to be in a rehab facility.  Patient is concerned that she would never come out of the rehab facility.  We discussed we can repeat the injection every 10 weeks if necessary.  Increase activity as tolerated.  Follow-up with me again in 6 to 8 weeks ? ?Updated 06/27/2021 ?Sara Garza is a 88 y.o. female coming in with complaint of right shoulder pain. Patient states that injection helped for one week and then pain returned to pre-injection level. Pain throughout the joint that is a dull ache with sharp pain with movement.  ? ?Should she continue tart cherry extract, Vit D and Arnica? ? ? ?  ? ?Past Medical History:  ?Diagnosis Date  ? Arthritis   ? CAD (coronary artery disease)   ? Carotid stenosis, left   ? 50-60% stable  ? Diabetes mellitus   ? Glaucoma   ? Hyperlipidemia   ? Hypertension   ? Leg pain   ? ABIs 2/18: normal bilaterally.  ? Osteoporosis   ? Valvular heart disease   ? Aortic Stenosis s/p [name] 2014 // Mitral stenosis // Echo 09/2018: EF > 65, mod LVH, severe focal basal hypertrophy, RVSP 39.8, mild to mod MR, mod  to severe MS (mean 9), AVR with mild AI, severe LAE (similar to prior echo)  ? ?Past Surgical History:  ?Procedure Laterality Date  ? ANOMALOUS PULMONARY VENOUS RETURN REPAIR, TOTAL    ? APPENDECTOMY    ? CARDIAC VALVE REPLACEMENT  04/29/2012  ? Cataracts Bilateral   ? CESAREAN SECTION    ? FOOT SURGERY    ? [name]  ? HIP ARTHROPLASTY Right 07/31/2013  ? Procedure: ARTHROPLASTY BIPOLAR HIP;  Surgeon: [name], MD;  Location: [facility];  Service: Orthopedics;  Laterality: Right;  ? INTRAMEDULLARY (IM) NAIL INTERTROCHANTERIC Left 01/01/2020  ? Procedure: INTRAMEDULLARY (IM) NAIL INTERTROCHANTRIC;  Surgeon: Rod Can, MD;  Location: [facility];  Service: Orthopedics;  Laterality: Left;  ? PERCUTANEOUS CORONARY STENT INTERVENTION (PCI-S) N/A 01/02/2012  ? Procedure: PERCUTANEOUS CORONARY STENT INTERVENTION (PCI-S);  Surgeon: Sherren Mocha, MD;  Location: [facility] CATH [facility];  Service: Cardiovascular;  Laterality: N/A;  ? ?Social History  ? ?Socioeconomic History  ? Marital status: Widowed  ?  Spouse name: Not on file  ? Number of children: Not on file  ? Years of education: Not on file  ? Highest education level: Not on file  ?Occupational History  ? Not on file  ?Tobacco Use  ? Smoking status: Never  ?  Passive exposure: Yes  ? Smokeless tobacco: Never  ?Substance and Sexual Activity  ? Alcohol use: Yes  ?  Alcohol/week: 0.0 standard drinks  ?  Comment: very seldom  ? Drug use: No  ? Sexual activity: Not Currently  ?Other Topics Concern  ? Not on file  ?Social History Narrative  ? She worked as a Network engineer   ? Widow for 10 years   ? Has one daughter  ?   ? She likes to read and she takes classes at [employer]  ? ?Social Determinants of Health  ? ?Financial Resource Strain: Low Risk   ? Difficulty of Paying Living Expenses: Not hard at all  ?Food Insecurity: No Food Insecurity  ? Worried About Charity fundraiser in the Last Year: Never true  ? Ran Out of Food in the Last Year: Never true  ?Transportation Needs: No  Transportation Needs  ? Lack of Transportation (Medical): No  ? Lack of Transportation (Non-Medical): No  ?Physical Activity: Inactive  ? Days of Exercise per Week: 0 days  ? Minutes of Exercise per Session: 0 min  ?Stress: No Stress Concern Present  ? Feeling of Stress : Not at all  ?Social Connections: Moderately Integrated  ? Frequency of Communication with Friends and Family: More than three times a week  ? Frequency of Social Gatherings with Friends and Family: More than three times a week  ? Attends Religious Services: More than 4 times per year  ? Active Member of Clubs or Organizations: Yes  ? Attends Archivist Meetings: More than 4 times per year  ? Marital Status: Widowed  ? ?Allergies  ?Allergen Reactions  ? Statins Other (See Comments)  ?  Muscle aches  ? Gabapentin Other (See Comments)  ?  SOMNOLENCE  ? ?Family History  ?Problem Relation Age of Onset  ? COPD Father   ? Cancer Father   ?     lung cancer  ? Lung cancer Other   ? ? ? ?Current Outpatient Medications (Cardiovascular):  ?  carvedilol (COREG) 3.125 MG tablet, TAKE 1 TABLET(3.125 MG) BY MOUTH TWICE DAILY ?  ezetimibe (ZETIA) 10 MG tablet, TAKE 1 TABLET(10 MG) BY MOUTH DAILY ?  furosemide (LASIX) 20 MG tablet, Take 1 tablet (20 mg total) by mouth daily. ? ? ?Current Outpatient Medications (Analgesics):  ?  acetaminophen (TYLENOL) 500 MG tablet, Take 2 tablets (1,000 mg total) by mouth 4 (four) times daily. ?  aspirin EC 81 MG tablet, Take 81 mg by mouth daily. Swallow whole. ? ? ?Current Outpatient Medications (Other):  ?  bimatoprost (LUMIGAN) 0.01 % SOLN, Place 1 drop into both eyes at bedtime.  ?  Calcium Carb-Cholecalciferol 500-400 MG-UNIT TABS, Take 1 tablet by mouth daily. ?  cephALEXin (KEFLEX) 500 MG capsule, Take 1 capsule (500 mg total) by mouth 2 (two) times daily. ?  COVID-19 mRNA bivalent vaccine, Pfizer, (PFIZER COVID-19 VAC BIVALENT) injection, Inject into the muscle. ?  doxycycline (VIBRAMYCIN) 100 MG capsule, Take 1  capsule (100 mg total) by mouth 2 (two) times daily. ?  Misc Natural Products (TART CHERRY ADVANCED PO), Take by mouth. ?  Multiple Vitamin (MULTIVITAMIN) tablet, Take 1 tablet by mouth daily. ?  Multiple Vitamins-Minerals (PRESERVISION AREDS 2+MULTI VIT PO), Take 2 capsules by mouth in the morning and at bedtime. ? ? ?Reviewed prior external information including notes and imaging from  ?primary care provider ?As well as notes that were available from care everywhere and other healthcare systems. ? ?Past medical history, social, surgical and family history all reviewed in electronic medical record.  No pertanent information unless stated regarding to the chief complaint.  ? ?  Review of Systems: ? No headache, visual changes, nausea, vomiting, diarrhea, constipation, dizziness, abdominal pain, skin rash, fevers, chills, night sweats, weight loss, swollen lymph nodes, body aches, joint swelling, chest pain, shortness of breath, mood changes. POSITIVE muscle aches ? ?Objective  ?Blood pressure (!) 132/52, pulse 78, height '4\' 11"'$  (1.499 m), weight 141 lb (64 kg), SpO2 96 %. ?  ?General: No apparent distress alert and oriented x3 mood and affect normal, dressed appropriately.  ?HEENT: Pupils equal, extraocular movements intact  ?Respiratory: Patient's speak in full sentences and does not appear short of breath  ?Cardiovascular: No lower extremity edema, non tender, no erythema  ?Gait normal with good balance and coordination.  ?MSK: Significant arthritic changes of multiple joints.  Right shoulder exam has significant effusion noted.  Mildly warm to touch.  Patient does have significant crepitus noted.  3-5 strength of the rotator cuff. ? ?Procedure: Real-time Ultrasound Guided Injection of right glenohumeral joint ?Device: GE Logiq Q7  ?Ultrasound guided injection is preferred based studies that show increased duration, increased effect, greater accuracy, decreased procedural pain, increased response rate with  ultrasound guided versus blind injection.  ?Verbal informed consent obtained.  ?Time-out conducted.  ?Noted no overlying erythema, induration, or other signs of local infection.  ?Skin prepped in a sterile fashio

## 2021-06-27 ENCOUNTER — Ambulatory Visit: Payer: Self-pay

## 2021-06-27 ENCOUNTER — Ambulatory Visit (INDEPENDENT_AMBULATORY_CARE_PROVIDER_SITE_OTHER): Payer: Medicare Other | Admitting: Family Medicine

## 2021-06-27 VITALS — BP 132/52 | HR 78 | Ht 59.0 in | Wt 141.0 lb

## 2021-06-27 DIAGNOSIS — I25119 Atherosclerotic heart disease of native coronary artery with unspecified angina pectoris: Secondary | ICD-10-CM

## 2021-06-27 DIAGNOSIS — M25511 Pain in right shoulder: Secondary | ICD-10-CM | POA: Diagnosis not present

## 2021-06-27 DIAGNOSIS — M2550 Pain in unspecified joint: Secondary | ICD-10-CM | POA: Diagnosis not present

## 2021-06-27 DIAGNOSIS — M75101 Unspecified rotator cuff tear or rupture of right shoulder, not specified as traumatic: Secondary | ICD-10-CM

## 2021-06-27 DIAGNOSIS — M19011 Primary osteoarthritis, right shoulder: Secondary | ICD-10-CM | POA: Diagnosis not present

## 2021-06-27 DIAGNOSIS — M12811 Other specific arthropathies, not elsewhere classified, right shoulder: Secondary | ICD-10-CM

## 2021-06-27 DIAGNOSIS — M19019 Primary osteoarthritis, unspecified shoulder: Secondary | ICD-10-CM | POA: Insufficient documentation

## 2021-06-27 MED ORDER — CEPHALEXIN 500 MG PO CAPS: 500.0000 mg | ORAL_CAPSULE | Freq: Two times a day (BID) | ORAL | 0 refills | Status: DC

## 2021-06-27 NOTE — Assessment & Plan Note (Signed)
Injection given today.  Patient tolerated the procedure well.  Once again secondary to the number of needle pokes today and patient's age and fragility we will put patient on antibiotic prophylactically.  Follow-up with me again in 2 to 3 months ?

## 2021-06-27 NOTE — Assessment & Plan Note (Signed)
Patient had aspiration done again today.  We attempted anterior as well as posterior.  Got a total of 40 cc of blood-tinged fluid.  We will send in for synovial analysis.  Do not see any true possible we will also put patient on antibiotics secondary to the lumbar punctures and with patient is 88 years old.  Patient can have this repeated every 2 to 3 months for more comfort measures.  Once again secondary to patient's age she is not a candidate for any type of replacement. ?

## 2021-06-27 NOTE — Patient Instructions (Addendum)
Drained posterior and anterior shoulder ?See me again in 2-3 months ? ? ?

## 2021-06-28 LAB — SYNOVIAL FLUID ANALYSIS, COMPLETE
Basophils, %: 0 %
Eosinophils-Synovial: 0 % (ref 0–2)
Lymphocytes-Synovial Fld: 22 % (ref 0–74)
Monocyte/Macrophage: 68 % (ref 0–69)
Neutrophil, Synovial: 2 % (ref 0–24)
Synoviocytes, %: 8 % (ref 0–15)
WBC, Synovial: 498 cells/uL — ABNORMAL HIGH (ref <150)

## 2021-06-29 DIAGNOSIS — E785 Hyperlipidemia, unspecified: Secondary | ICD-10-CM

## 2021-06-29 DIAGNOSIS — I2510 Atherosclerotic heart disease of native coronary artery without angina pectoris: Secondary | ICD-10-CM | POA: Diagnosis not present

## 2021-06-29 DIAGNOSIS — I5032 Chronic diastolic (congestive) heart failure: Secondary | ICD-10-CM

## 2021-06-29 DIAGNOSIS — I110 Hypertensive heart disease with heart failure: Secondary | ICD-10-CM | POA: Diagnosis not present

## 2021-07-04 DIAGNOSIS — H04123 Dry eye syndrome of bilateral lacrimal glands: Secondary | ICD-10-CM | POA: Diagnosis not present

## 2021-07-04 DIAGNOSIS — H401131 Primary open-angle glaucoma, bilateral, mild stage: Secondary | ICD-10-CM | POA: Diagnosis not present

## 2021-07-23 ENCOUNTER — Telehealth: Payer: Self-pay | Admitting: Pharmacist

## 2021-07-23 NOTE — Chronic Care Management (AMB) (Signed)
? ? ?Chronic Care Management ?Pharmacy Assistant  ? ?Name: Sara Garza  MRN: [id number] DOB: 04-16-1923 ? ?Reason for Encounter: Disease State ?  ?Conditions to be addressed/monitored: ?HTN ? ?Recent office visits:  ?06/25/21 Dimitri Ped - Patient presented for Nurse CCM visit. ? ?06/15/21 [name], NP - Patient presented for Dysphagia unspecified and other concerns. Methylprednisolone Acetate 80 mg administered.  ? ?06/12/21 [name], NP - Patient presented for Cellulitis of left lower extremity. Prescribed Doxycycline Hyclate. Stopped Amlodipine Besylate 5 mg. Stopped Amoxicillin 500 mg. Stopped Diclofenac 2 g. ? ?04/23/21 [name], LPN - Patient presented for Medicare Annual Wellness Exam. No medication changes. ? ?04/10/21 Nafziger, [name] - Claims encounter for Unspecified urinary incontinence and other concerns. No other visit details available. ? ?Recent consult visits:  ?06/27/21 Lyndal Pulley, DO - Patient presented for pain in joint of right shoulder and other concerns. Prescribed Cephalexin 500 mg. ? 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 No other visit details available. ? ?05/16/21 Lyndal Pulley, DO - Patient presented for right shoulder pain unspecified chronicity and other concerns.  ? ?04/04/21 Sherren Mocha, MD (Cardiology) - Patient presented for Aortic valve disease and other concerns. Decreased Furosemide to 20 mg.  ? ?Hospital visits:  ?None in previous 6 months ? ?Medications: ?Outpatient Encounter Medications as of 07/23/2021  ?Medication Sig  ? acetaminophen (TYLENOL) 500 MG tablet Take 2 tablets (1,000 mg total) by mouth 4 (four) times daily.  ? aspirin EC 81 MG tablet Take 81 mg by mouth daily. Swallow whole.  ? bimatoprost (LUMIGAN) 0.01 % SOLN Place 1 drop into both eyes at bedtime.   ? Calcium Carb-Cholecalciferol 500-400 MG-UNIT TABS Take 1  tablet by mouth daily.  ? carvedilol (COREG) 3.125 MG tablet TAKE 1 TABLET(3.125 MG) BY MOUTH TWICE DAILY  ? cephALEXin (KEFLEX) 500 MG capsule Take 1 capsule (500 mg total) by mouth 2 (two) times daily.  ? COVID-19 mRNA bivalent vaccine, Pfizer, (PFIZER COVID-19 VAC BIVALENT) injection Inject into the muscle.  ? doxycycline (VIBRAMYCIN) 100 MG capsule Take 1 capsule (100 mg total) by mouth 2 (two) times daily.  ? ezetimibe (ZETIA) 10 MG tablet TAKE 1 TABLET(10 MG) BY MOUTH DAILY  ? furosemide (LASIX) 20 MG tablet Take 1 tablet (20 mg total) by mouth daily.  ? Misc Natural Products (TART CHERRY ADVANCED PO) Take by mouth.  ? Multiple Vitamin (MULTIVITAMIN) tablet Take 1 tablet by mouth daily.  ? Multiple Vitamins-Minerals (PRESERVISION AREDS 2+MULTI VIT PO) Take 2 capsules by mouth in the morning and at bedtime.  ? ?No facility-administered encounter medications on file as of 07/23/2021.  ?Reviewed chart prior to disease state call. Spoke with patient regarding BP ? ?Recent Office Vitals: ?BP Readings from Last 3 Encounters:  ?06/27/21 (!) 132/52  ?06/15/21 138/60  ?06/12/21 140/70  ? ?Pulse Readings from Last 3 Encounters:  ?06/27/21 78  ?06/15/21 79  ?06/12/21 87  ?  ?Wt Readings from Last 3 Encounters:  ?06/27/21 141 lb (64 kg)  ?06/12/21 143 lb (64.9 kg)  ?05/16/21 143 lb (64.9 kg)  ?  ? ?Kidney Function ?Lab Results  ?Component Value Date/Time  ? CREATININE 1.59 (H) 04/18/2021 11:47 AM  ? CREATININE 1.63 (H) 02/16/2021 03:52 PM  ? CREATININE 1.38 (H) 07/31/2020 04:10 PM  ? CREATININE 1.86 (H) 03/14/2020 10:31 AM  ? GFR 23.61 (L) 04/05/2020 01:10 PM  ? GFRNONAA 23 (L) 03/07/2020  11:18 AM  ? GFRAA 27 (L) 03/07/2020 11:18 AM  ? ? ? ?  Latest Ref Rng & Units 04/18/2021  ? 11:47 AM 02/16/2021  ?  3:52 PM 07/31/2020  ?  4:10 PM  ?BMP  ?Glucose 70 - 99 mg/dL 110   85   70    ?BUN 10 - 36 mg/dL 45   43   41    ?Creatinine 0.57 - 1.00 mg/dL 1.59   1.63   1.38    ?BUN/Creat Ratio 12 - '28 28   26   30    '$ ?Sodium 134 - 144  mmol/L 142   143   145    ?Potassium 3.5 - 5.2 mmol/L 4.6   4.6   5.0    ?Chloride 96 - 106 mmol/L 106   106   106    ?CO2 20 - 29 mmol/L '23   27   25    '$ ?Calcium 8.7 - 10.3 mg/dL 9.3   9.7   9.5    ? ? ?Current antihypertensive regimen:  ?Amlodipine 5 mg 1 tablet daily - in AM ?Carvedilol 3.125 mg twice daily  ?Furosemide 40 mg every other day ? ? ?Care Gaps: ?Zoster Vaccine - Overdue ?Urine Micro - Overdue ?Foot Exam - Overdue ?Eye Exam - Overdue ?BP- 138/60 ( 06/15/21) ?AWV- 1/23 ?CCM-  12/23 ?Lab Results  ?Component Value Date  ? HGBA1C 5.9 (H) 02/16/2021  ? ? ?Star Rating Drugs: ?None ? ? ? ?Ned Clines CMA ?Clinical Pharmacist Assistant ?[phone] ? ?

## 2021-07-24 DIAGNOSIS — L570 Actinic keratosis: Secondary | ICD-10-CM | POA: Diagnosis not present

## 2021-07-24 DIAGNOSIS — L821 Other seborrheic keratosis: Secondary | ICD-10-CM | POA: Diagnosis not present

## 2021-08-07 ENCOUNTER — Encounter: Payer: Self-pay | Admitting: Family Medicine

## 2021-08-07 ENCOUNTER — Ambulatory Visit (INDEPENDENT_AMBULATORY_CARE_PROVIDER_SITE_OTHER): Payer: Medicare Other | Admitting: Family Medicine

## 2021-08-07 VITALS — BP 138/70 | HR 87 | Temp 98.0°F | Resp 16 | Ht 59.0 in | Wt 140.0 lb

## 2021-08-07 DIAGNOSIS — R2242 Localized swelling, mass and lump, left lower limb: Secondary | ICD-10-CM

## 2021-08-07 DIAGNOSIS — I25119 Atherosclerotic heart disease of native coronary artery with unspecified angina pectoris: Secondary | ICD-10-CM

## 2021-08-07 DIAGNOSIS — L049 Acute lymphadenitis, unspecified: Secondary | ICD-10-CM

## 2021-08-07 NOTE — Progress Notes (Signed)
? ?ACUTE VISIT ?Chief Complaint  ?Patient presents with  ? lump on neck  ?  Right side, below ear and goes towards her collar bone. Started last night, sore.   ? ?HPI: ?Sara Garza is a 88 y.o. female, who is here today complaining of tender mass right submandibular as described above. ?She noted problem last night. ?It is very tender upon palpation. ?No hx of trauma or animal scratches. ? ?Problem is stable. ?Negative for fever,chills,night sweats,abnormal wt loss, sore throat, oral lesions, cough,SOB,abdominal pain, or skin rash. ?She has not noted erythema on affected area. ?She has not tried treatments. ? ?She is also concerned about tender "knot" left pretibial. States that about 2 months ago her distal  LLE was hit buy a car door. She developed erythema and worsening pain on area, treated with doxycycline. She tells me that she completed abx treatment last week but still tender. Erythema has resolved and pain has improved. ? ?Review of Systems  ?HENT:  Negative for congestion, dental problem, postnasal drip, rhinorrhea and trouble swallowing.   ?Respiratory:  Negative for wheezing and stridor.   ?Cardiovascular:  Negative for chest pain.  ?Gastrointestinal:  Negative for nausea and vomiting.  ?Genitourinary:  Negative for decreased urine volume, dysuria and hematuria.  ?Musculoskeletal:  Positive for arthralgias and gait problem.  ?Neurological:  Negative for syncope and headaches.  ?Rest see pertinent positives and negatives per HPI. ? ?Current Outpatient Medications on File Prior to Visit  ?Medication Sig Dispense Refill  ? acetaminophen (TYLENOL) 500 MG tablet Take 2 tablets (1,000 mg total) by mouth 4 (four) times daily. 30 tablet 0  ? amLODipine (NORVASC) 5 MG tablet Take 5 mg by mouth daily.    ? aspirin EC 81 MG tablet Take 81 mg by mouth daily. Swallow whole.    ? bimatoprost (LUMIGAN) 0.01 % SOLN Place 1 drop into both eyes at bedtime.     ? Calcium Carb-Cholecalciferol 500-400 MG-UNIT TABS  Take 1 tablet by mouth daily.    ? carvedilol (COREG) 3.125 MG tablet TAKE 1 TABLET(3.125 MG) BY MOUTH TWICE DAILY 180 tablet 1  ? COVID-19 mRNA bivalent vaccine, Pfizer, (PFIZER COVID-19 VAC BIVALENT) injection Inject into the muscle. 0.3 mL 0  ? doxycycline (VIBRAMYCIN) 100 MG capsule Take 1 capsule (100 mg total) by mouth 2 (two) times daily. 20 capsule 0  ? ezetimibe (ZETIA) 10 MG tablet TAKE 1 TABLET(10 MG) BY MOUTH DAILY 90 tablet 1  ? furosemide (LASIX) 20 MG tablet Take 1 tablet (20 mg total) by mouth daily. 90 tablet 1  ? Misc Natural Products (TART CHERRY ADVANCED PO) Take by mouth.    ? Multiple Vitamin (MULTIVITAMIN) tablet Take 1 tablet by mouth daily.    ? Multiple Vitamins-Minerals (PRESERVISION AREDS 2+MULTI VIT PO) Take 2 capsules by mouth in the morning and at bedtime.    ? ?No current facility-administered medications on file prior to visit.  ? ?Past Medical History:  ?Diagnosis Date  ? Arthritis   ? CAD (coronary artery disease)   ? Carotid stenosis, left   ? 50-60% stable  ? Diabetes mellitus   ? Glaucoma   ? Hyperlipidemia   ? Hypertension   ? Leg pain   ? ABIs 2/18: normal bilaterally.  ? Osteoporosis   ? Valvular heart disease   ? Aortic Stenosis s/p [name] 2014 // Mitral stenosis // Echo 09/2018: EF > 65, mod LVH, severe focal basal hypertrophy, RVSP 39.8, mild to mod MR, mod to severe MS (  mean 9), AVR with mild AI, severe LAE (similar to prior echo)  ? ?Allergies  ?Allergen Reactions  ? Statins Other (See Comments)  ?  Muscle aches  ? Gabapentin Other (See Comments)  ?  SOMNOLENCE  ? ? ?Social History  ? ?Socioeconomic History  ? Marital status: Widowed  ?  Spouse name: Not on file  ? Number of children: Not on file  ? Years of education: Not on file  ? Highest education level: Not on file  ?Occupational History  ? Not on file  ?Tobacco Use  ? Smoking status: Never  ?  Passive exposure: Yes  ? Smokeless tobacco: Never  ?Substance and Sexual Activity  ? Alcohol use: Yes  ?  Alcohol/week: 0.0  standard drinks  ?  Comment: very seldom  ? Drug use: No  ? Sexual activity: Not Currently  ?Other Topics Concern  ? Not on file  ?Social History Narrative  ? She worked as a Network engineer   ? Widow for 10 years   ? Has one daughter  ?   ? She likes to read and she takes classes at [employer]  ? ?Social Determinants of Health  ? ?Financial Resource Strain: Low Risk   ? Difficulty of Paying Living Expenses: Not hard at all  ?Food Insecurity: No Food Insecurity  ? Worried About Charity fundraiser in the Last Year: Never true  ? Ran Out of Food in the Last Year: Never true  ?Transportation Needs: No Transportation Needs  ? Lack of Transportation (Medical): No  ? Lack of Transportation (Non-Medical): No  ?Physical Activity: Inactive  ? Days of Exercise per Week: 0 days  ? Minutes of Exercise per Session: 0 min  ?Stress: No Stress Concern Present  ? Feeling of Stress : Not at all  ?Social Connections: Moderately Integrated  ? Frequency of Communication with Friends and Family: More than three times a week  ? Frequency of Social Gatherings with Friends and Family: More than three times a week  ? Attends Religious Services: More than 4 times per year  ? Active Member of Clubs or Organizations: Yes  ? Attends Archivist Meetings: More than 4 times per year  ? Marital Status: Widowed  ? ? ?Vitals:  ? 08/07/21 1423  ?BP: 138/70  ?Pulse: 87  ?Resp: 16  ?Temp: 98 ?F (36.7 ?C)  ?SpO2: 93%  ? ?Body mass index is 28.28 kg/m?. ? ?Physical Exam ?Vitals and nursing note reviewed.  ?Constitutional:   ?   General: She is not in acute distress. ?   Appearance: She is well-developed. She is not ill-appearing.  ?HENT:  ?   Head: Normocephalic and atraumatic.  ?   Right Ear: External ear normal. No tenderness.  ?   Left Ear: External ear normal.  ?   Ears:  ?   Comments: Right ear canal excess cerumen, cannot see TM.  ?   Nose: Rhinorrhea present.  ?   Right Sinus: No maxillary sinus tenderness or frontal sinus tenderness.  ?    Left Sinus: No maxillary sinus tenderness or frontal sinus tenderness.  ?   Mouth/Throat:  ?   Mouth: Mucous membranes are moist.  ?   Dentition: No gingival swelling.  ?   Tongue: No lesions.  ?   Pharynx: Oropharynx is clear.  ?Eyes:  ?   Conjunctiva/sclera: Conjunctivae normal.  ?Cardiovascular:  ?   Rate and Rhythm: Normal rate and regular rhythm.  ?   Heart  sounds: No murmur heard. ?Pulmonary:  ?   Effort: Pulmonary effort is normal. No respiratory distress.  ?   Breath sounds: Normal breath sounds. No stridor.  ?Musculoskeletal:  ?   Cervical back: No edema or erythema. No muscular tenderness.  ?Lymphadenopathy:  ?   Head:  ?   Right side of head: Tonsillar (Enlarged, 2.0-2.5 cm, very tender with light touch. No skin erythema,local heat,or induration.) adenopathy present. No submandibular adenopathy.  ?   Left side of head: No submandibular adenopathy.  ?   Cervical: No cervical adenopathy.  ?Skin: ?   General: Skin is warm.  ?   Findings: No erythema or rash.  ? ?    ?Neurological:  ?   Mental Status: She is alert and oriented to person, place, and time.  ?   Comments: Unstable gait assisted by a walker.  ?Psychiatric:     ?   Speech: Speech normal.  ?   Comments: Well groomed, good eye contact.  ? ?ASSESSMENT AND PLAN: ? ?Ms.Celestia was seen today for lump on neck. ? ?Diagnoses and all orders for this visit: ?Orders Placed This Encounter  ?Procedures  ? CBC with Differential/Platelet  ? C-reactive protein  ? Sedimentation rate  ? ?Lab Results  ?Component Value Date  ? WBC 7.8 08/07/2021  ? HGB 12.5 08/07/2021  ? HCT 37.3 08/07/2021  ? MCV 96.6 08/07/2021  ? PLT 212.0 08/07/2021  ? ?Lab Results  ?Component Value Date  ? ESRSEDRATE 13 08/07/2021  ? ?Lab Results  ?Component Value Date  ? CRP <1.0 08/07/2021  ? ?Lymphadenitis, acute ?We discussed possible etiologies, including benign/infectious as well as more serious process. ?She was treated with Cephalexin in 05/2021, just completed Doxycycline. We will wait  for lab results before making recommendations about more abx treatment. ?We can hold on imaging for now. ?I did not appreciate oral lesions. She has an appt with her dentist coming. ? ?Mass of left lower l

## 2021-08-07 NOTE — Patient Instructions (Addendum)
A few things to remember from today's visit: ? ?Lymphadenitis, acute - Plan: CBC with Differential/Platelet, C-reactive protein, Sedimentation rate ? ?Mass of left lower leg ? ?Do not use My Chart to request refills or for acute issues that need immediate attention. ? Neck mass could be viral but more serious problems need to be consider. ?Labs done today. ?It does not seem to be infectious. ?Monitor for skin changes. ?If not resolved in a few weeks you may need biopsy and/or imaging. ? ?Leg knot could be residual hematoma after injury. ?Continue monitoring. ? ?Please be sure medication list is accurate. ?If a new problem present, please set up appointment sooner than planned today. ? ? ? ? ? ? ? ?

## 2021-08-08 LAB — CBC WITH DIFFERENTIAL/PLATELET
Basophils Absolute: 0 10*3/uL (ref 0.0–0.1)
Basophils Relative: 0.5 % (ref 0.0–3.0)
Eosinophils Absolute: 0.1 10*3/uL (ref 0.0–0.7)
Eosinophils Relative: 1.2 % (ref 0.0–5.0)
HCT: 37.3 % (ref 36.0–46.0)
Hemoglobin: 12.5 g/dL (ref 12.0–15.0)
Lymphocytes Relative: 16.6 % (ref 12.0–46.0)
Lymphs Abs: 1.3 10*3/uL (ref 0.7–4.0)
MCHC: 33.4 g/dL (ref 30.0–36.0)
MCV: 96.6 fl (ref 78.0–100.0)
Monocytes Absolute: 0.8 10*3/uL (ref 0.1–1.0)
Monocytes Relative: 10.6 % (ref 3.0–12.0)
Neutro Abs: 5.5 10*3/uL (ref 1.4–7.7)
Neutrophils Relative %: 71.1 % (ref 43.0–77.0)
Platelets: 212 10*3/uL (ref 150.0–400.0)
RBC: 3.86 Mil/uL — ABNORMAL LOW (ref 3.87–5.11)
RDW: 14.3 % (ref 11.5–15.5)
WBC: 7.8 10*3/uL (ref 4.0–10.5)

## 2021-08-08 LAB — C-REACTIVE PROTEIN: CRP: <1 mg/dL (ref 0.5–20.0)

## 2021-08-08 LAB — SEDIMENTATION RATE: Sed Rate: 13 mm/hr (ref 0–30)

## 2021-08-13 ENCOUNTER — Telehealth: Payer: Self-pay | Admitting: Family Medicine

## 2021-08-13 NOTE — Telephone Encounter (Signed)
Patient called stating that in the past, Dr Tamala Julian mentioned to her about a procedure for her shoulder? She said that she is in a lot of pain and at this point, is interested in more information on what it is. ? ?Please advise. ? ?

## 2021-08-14 NOTE — Telephone Encounter (Signed)
Have appt on 09/06/2021 at 10:45am on hold for patient when she calls back.  ?

## 2021-08-14 NOTE — Telephone Encounter (Signed)
Spoke with patient and she would like to wait to see Dr. Tamala Julian on 5/7. Told her to go into ED if pain worsens and she cannot manage. Patient voices understanding.  ?

## 2021-08-20 ENCOUNTER — Ambulatory Visit (INDEPENDENT_AMBULATORY_CARE_PROVIDER_SITE_OTHER): Payer: Medicare Other

## 2021-08-20 DIAGNOSIS — E785 Hyperlipidemia, unspecified: Secondary | ICD-10-CM

## 2021-08-20 DIAGNOSIS — G8929 Other chronic pain: Secondary | ICD-10-CM

## 2021-08-20 DIAGNOSIS — I5032 Chronic diastolic (congestive) heart failure: Secondary | ICD-10-CM

## 2021-08-20 DIAGNOSIS — I10 Essential (primary) hypertension: Secondary | ICD-10-CM

## 2021-08-20 DIAGNOSIS — I2510 Atherosclerotic heart disease of native coronary artery without angina pectoris: Secondary | ICD-10-CM

## 2021-08-20 NOTE — Patient Instructions (Signed)
Visit Information  Thank you for taking time to visit with me today. Please don't hesitate to contact me if I can be of assistance to you before our next scheduled telephone appointment.  Following are the goals we discussed today:  Take all medications as prescribed Attend all scheduled provider appointments Call pharmacy for medication refills 3-7 days in advance of running out of medications Call provider office for new concerns or questions  call office if I gain more than 2 pounds in one day or 5 pounds in one week keep legs up while sitting use salt in moderation watch for swelling in feet, ankles and legs every day weigh myself daily follow rescue plan if symptoms flare-up eat more whole grains, fruits and vegetables, lean meats and healthy fats check blood pressure 3 times per week choose a place to take my blood pressure (home, clinic or office, retail store) keep a blood pressure log take blood pressure log to all doctor appointments limit salt intake to '2300mg'$ /day call for medicine refill 2 or 3 days before it runs out take all medications exactly as prescribed call doctor with any symptoms you believe are related to your medicine  Our next appointment is by telephone on 11/12/21 at 1:15 PM  Please call the care guide team at [phone] if you need to cancel or reschedule your appointment.   If you are experiencing a Mental Health or [place] or need someone to talk to, please call the Suicide and Crisis Lifeline: 988 call the [place] National Suicide Prevention Lifeline: (925)326-1462 or TTY: (917)309-2441 TTY 4844570350) to talk to a trained counselor call 1-800-273-TALK (toll free, 24 hour hotline) go to [facility] [address] [place] [phone]) call 911   The patient verbalized understanding of instructions, educational materials, and care plan provided today and agreed to receive a mailed copy of  patient instructions, educational materials, and care plan.   Peter Garter RN, Jackquline Denmark, CDE Care Management Coordinator [facility] [phone]

## 2021-08-20 NOTE — Chronic Care Management (AMB) (Signed)
Chronic Care Management   CCM RN Visit Note  08/20/2021 Name: Sara Garza MRN: [id number] DOB: 10-13-1923  Subjective: Sara Garza is a 88 y.o. year old female who is a primary care patient of [name], NP. The care management team was consulted for assistance with disease management and care coordination needs.    Engaged with patient by telephone for follow up visit in response to provider referral for case management and/or care coordination services.   Consent to Services:  The patient was given information about Chronic Care Management services, agreed to services, and gave verbal consent prior to initiation of services.  Please see initial visit note for detailed documentation.   Patient agreed to services and verbal consent obtained.   Assessment: Review of patient past medical history, allergies, medications, health status, including review of consultants reports, laboratory and other test data, was performed as part of comprehensive evaluation and provision of chronic care management services.   SDOH (Social Determinants of Health) assessments and interventions performed:    CCM Care Plan  Allergies  Allergen Reactions   Statins Other (See Comments)    Muscle aches   Gabapentin Other (See Comments)    SOMNOLENCE    Outpatient Encounter Medications as of 08/20/2021  Medication Sig   acetaminophen (TYLENOL) 500 MG tablet Take 2 tablets (1,000 mg total) by mouth 4 (four) times daily.   amLODipine (NORVASC) 5 MG tablet Take 5 mg by mouth daily.   aspirin EC 81 MG tablet Take 81 mg by mouth daily. Swallow whole.   bimatoprost (LUMIGAN) 0.01 % SOLN Place 1 drop into both eyes at bedtime.    Calcium Carb-Cholecalciferol 500-400 MG-UNIT TABS Take 1 tablet by mouth daily.   carvedilol (COREG) 3.125 MG tablet TAKE 1 TABLET(3.125 MG) BY MOUTH TWICE DAILY   COVID-19 mRNA bivalent vaccine, Pfizer, (PFIZER COVID-19 VAC BIVALENT) injection Inject into the muscle.    doxycycline (VIBRAMYCIN) 100 MG capsule Take 1 capsule (100 mg total) by mouth 2 (two) times daily.   ezetimibe (ZETIA) 10 MG tablet TAKE 1 TABLET(10 MG) BY MOUTH DAILY   furosemide (LASIX) 20 MG tablet Take 1 tablet (20 mg total) by mouth daily.   Misc Natural Products (TART CHERRY ADVANCED PO) Take by mouth.   Multiple Vitamin (MULTIVITAMIN) tablet Take 1 tablet by mouth daily.   Multiple Vitamins-Minerals (PRESERVISION AREDS 2+MULTI VIT PO) Take 2 capsules by mouth in the morning and at bedtime.   No facility-administered encounter medications on file as of 08/20/2021.    Patient Active Problem List   Diagnosis Date Noted   AC (acromioclavicular) arthritis 06/27/2021   Right rotator cuff tear arthropathy 05/16/2021   Acute CHF (congestive heart failure) ([place]) 02/14/2020   Acute blood loss anemia 01/03/2020   Eye irritation 01/01/2020   Mitral valve stenosis    Benign essential HTN    Chronic diastolic CHF (congestive heart failure) ([place])    Hip fracture ([place]) 12/30/2019   Closed fracture of femur, intertrochanteric, left, initial encounter ([place])    Acute on chronic diastolic CHF (congestive heart failure) ([place]) 02/25/2018   S/P [name] (transcatheter aortic valve replacement) 02/25/2018   Leg pain    Hyperlipidemia    Glaucoma    Carotid stenosis, left    Arthritis    Urinary incontinence 02/12/2017   Chronic bilateral low back pain 08/12/2016   Spondylosis of lumbar joint 08/12/2016   Bilateral leg pain 08/12/2016   Neck mass 04/15/2015   Status post hip hemiarthroplasty 07/31/2013  CAD (coronary artery disease) 07/31/2013   CKD (chronic kidney disease), stage III ([place]) 07/31/2013   Left bundle branch block 07/31/2013   Severe calcific aortic stenosis 06/18/2010   Diabetic polyneuropathy ([place]) 04/16/2010   GLAUCOMA 01/06/2009   CHEST PAIN, ATYPICAL 12/16/2008   Bilateral shoulder pain 11/16/2007   OSTEOARTHRITIS 02/02/2007   Type 2 diabetes mellitus with renal  manifestations, controlled ([place]) 10/24/2006   Dyslipidemia 10/02/2006   Essential hypertension 10/02/2006    Conditions to be addressed/monitored:CHF, CAD, HTN, HLD, and Osteoarthritis  Care Plan : RN Care Manager Plan of Care  Updates made by Dimitri Ped, RN since 08/20/2021 12:00 AM     Problem: Chronic Disease Management and Care Coordination Needs (CHF, CAD,HTN, HLD, osteoarthritis)   Priority: High     Long-Range Goal: Establish Plan of Care for Chronic Disease Management Needs (CHF, CAD,HTN, HLD, Osteoarthritis)   Start Date: 06/25/2021  Expected End Date: 06/25/2022  Priority: High  Note:   Current Barriers:  Chronic Disease Management support and education needs related to CHF, CAD, HTN, HLD, and Osteoarthritis  States that the swelling in neck is gone now.  States is still having pain in her  rt shoulder pain.  States she goes back to see Dr. Tamala Julian on 09/05/21 for her shoulder. Denies any chest pains, shortness of breath or increased swelling in her legs.  States she does not weigh daily but does about once a week.  States her B/P was good when she checked it a few days ago.  States she has friends that help her shop and do errands and her daughter is very supportive.  States she really does try to use less salt in her food and tries to eat healthy  RNCM Clinical Goal(s):  Patient will verbalize understanding of plan for management of CHF, CAD, HTN, HLD, and Osteoarthritis as evidenced by voiced adherence to plan of care verbalize basic understanding of  CHF, CAD, HTN, HLD, and Osteoarthritis disease process and self health management plan as evidenced by voiced understanding and teach back  take all medications exactly as prescribed and will call provider for medication related questions as evidenced by dispense report and pt verbalization  attend all scheduled medical appointments: Sports Medicine 09/05/21, Cardiology 10/03/21, CCM pharmD 03/06/22, Annual Wellness Visit 04/24/22 as  evidenced by medical records demonstrate Improved adherence to prescribed treatment plan for CHF, CAD, HTN, HLD, and Osteoarthritis as evidenced by readings within limits, voiced adherence to plan of care continue to work with RN Care Manager to address care management and care coordination needs related to  CHF, CAD, HTN, HLD, and Osteoarthritis as evidenced by adherence to CM Team Scheduled appointments through collaboration with RN Care manager, provider, and care team.   Interventions: 1:1 collaboration with primary care provider regarding development and update of comprehensive plan of care as evidenced by provider attestation and co-signature Inter-disciplinary care team collaboration (see longitudinal plan of care) Evaluation of current treatment plan related to  self management and patient's adherence to plan as established by provider   CAD Interventions: (Status:  Goal on track:  Yes.) Long Term Goal Assessed understanding of CAD diagnosis Medications reviewed including medications utilized in CAD treatment plan Provided education on importance of blood pressure control in management of CAD Provided education on Importance of limiting foods high in cholesterol Counseled on importance of regular laboratory monitoring as prescribed Reviewed Importance of taking all medications as prescribed Reviewed Importance of attending all scheduled provider appointments Advised to report any  changes in symptoms or exercise tolerance   Heart Failure Interventions:  (Status:  Goal on track:  Yes.) Long Term Goal Basic overview and discussion of pathophysiology of Heart Failure reviewed Provided education on low sodium diet Reviewed Heart Failure Action Plan in depth and provided written copy Assessed need for readable accurate scales in home Advised patient to weigh each morning after emptying bladder Discussed importance of daily weight and advised patient to weigh and record daily Reviewed  role of diuretics in prevention of fluid overload and management of heart failure; Discussed the importance of keeping all appointments with provider Assessed social determinant of health barriers  Reviewed s/sx of HF and when to call provider  Hyperlipidemia Interventions:  (Status:  Goal on track:  Yes.) Long Term Goal Medication review performed; medication list updated in electronic medical record.  Provider established cholesterol goals reviewed Counseled on importance of regular laboratory monitoring as prescribed Reviewed importance of limiting foods high in cholesterol Reviewed to fried foods  Hypertension Interventions:  (Status:  Goal on track:  Yes.) Long Term Goal Last practice recorded BP readings:  BP Readings from Last 3 Encounters:  08/07/21 138/70  06/27/21 (!) 132/52  06/15/21 138/60  Most recent eGFR/CrCl:  Lab Results  Component Value Date   EGFR 30 (L) 04/18/2021    No components found for: CRCL  Evaluation of current treatment plan related to hypertension self management and patient's adherence to plan as established by provider Provided education to patient re: stroke prevention, s/s of heart attack and stroke Reviewed medications with patient and discussed importance of compliance Discussed plans with patient for ongoing care management follow up and provided patient with direct contact information for care management team Advised patient, providing education and rationale, to monitor blood pressure daily and record, calling PCP for findings outside established parameters Provided education on prescribed diet low sodium heart healthy  Pain Interventions:  (Status:  Goal on track:  Yes.) Long Term Goal Pain assessment performed Medications reviewed Reviewed provider established plan for pain management Discussed importance of adherence to all scheduled medical appointments Counseled on the importance of reporting any/all new or changed pain symptoms or  management strategies to pain management provider Advised patient to report to care team affect of pain on daily activities Reviewed with patient prescribed pharmacological and nonpharmacological pain relief strategies Reviewed to keep appointment with sports med on 09/05/21  Patient Goals/Self-Care Activities: Take all medications as prescribed Attend all scheduled provider appointments Call pharmacy for medication refills 3-7 days in advance of running out of medications Call provider office for new concerns or questions  call office if I gain more than 2 pounds in one day or 5 pounds in one week keep legs up while sitting use salt in moderation watch for swelling in feet, ankles and legs every day weigh myself daily follow rescue plan if symptoms flare-up eat more whole grains, fruits and vegetables, lean meats and healthy fats check blood pressure 3 times per week choose a place to take my blood pressure (home, clinic or office, retail store) keep a blood pressure log take blood pressure log to all doctor appointments limit salt intake to 2347m/day call for medicine refill 2 or 3 days before it runs out take all medications exactly as prescribed call doctor with any symptoms you believe are related to your medicine  Follow Up Plan:  Telephone follow up appointment with care management team member scheduled for:  11/12/21 The patient has been provided with contact information  for the care management team and has been advised to call with any health related questions or concerns.       Plan:Telephone follow up appointment with care management team member scheduled for:  11/12/21 The patient has been provided with contact information for the care management team and has been advised to call with any health related questions or concerns.  Peter Garter RN, Jackquline Denmark, CDE Care Management Coordinator [facility] [phone]

## 2021-08-29 DIAGNOSIS — I2510 Atherosclerotic heart disease of native coronary artery without angina pectoris: Secondary | ICD-10-CM

## 2021-08-29 DIAGNOSIS — E785 Hyperlipidemia, unspecified: Secondary | ICD-10-CM | POA: Diagnosis not present

## 2021-08-29 DIAGNOSIS — I509 Heart failure, unspecified: Secondary | ICD-10-CM

## 2021-08-29 DIAGNOSIS — I110 Hypertensive heart disease with heart failure: Secondary | ICD-10-CM

## 2021-09-03 NOTE — Progress Notes (Unsigned)
[place] [place] Phone: [phone] Subjective:   [name], am serving as a scribe for Dr. [name].   I'm seeing this patient by the request  of:  [name], NP  CC: Shoulder pain follow-up  [id number]  06/27/2021 Injection given today.  Patient tolerated the procedure well.  Once again secondary to the number of needle pokes today and patient's age and fragility we will put patient on antibiotic prophylactically.  Follow-up with me again in 2 to 3 months  Patient had aspiration done again today.  We attempted anterior as well as posterior.  Got a total of 40 cc of blood-tinged fluid.  We will send in for synovial analysis.  Do not see any true possible we will also put patient on antibiotics secondary to the lumbar punctures and with patient is 88 years old.  Patient can have this repeated every 2 to 3 months for more comfort measures.  Once again secondary to patient's age she is not a candidate for any type of replacement.  Updated 6/7/82023 [place] Cage Sallas is a 88 y.o. female coming in with complaint of R shoulder pain.  The patient does have severe arthritic changes with continued recurrent hemarthrosis of the right shoulder.  Patient states that her pain is unchanged in R shoulder. Pain is radiating further down the arm into deltoid. Pain is sharp and causes patient to gasp for air. Arm pain worse when using walker but can be sitting still and the pain will occur.        Past Medical History:  Diagnosis Date   Arthritis    CAD (coronary artery disease)    Carotid stenosis, left    50-60% stable   Diabetes mellitus    Glaucoma    Hyperlipidemia    Hypertension    Leg pain    ABIs 2/18: normal bilaterally.   Osteoporosis    Valvular heart disease    Aortic Stenosis s/p [name] 2014 // Mitral stenosis // Echo 09/2018: EF > 65, mod LVH, severe focal basal hypertrophy, RVSP 39.8, mild to mod MR,  mod to severe MS (mean 9), AVR with mild AI, severe LAE (similar to prior echo)   Past Surgical History:  Procedure Laterality Date   ANOMALOUS PULMONARY VENOUS RETURN REPAIR, TOTAL     APPENDECTOMY     CARDIAC VALVE REPLACEMENT  04/29/2012   Cataracts Bilateral    CESAREAN SECTION     FOOT SURGERY     [name]   HIP ARTHROPLASTY Right 07/31/2013   Procedure: ARTHROPLASTY BIPOLAR HIP;  Surgeon: [name], MD;  Location: [facility];  Service: Orthopedics;  Laterality: Right;   INTRAMEDULLARY (IM) NAIL INTERTROCHANTERIC Left 01/01/2020   Procedure: INTRAMEDULLARY (IM) NAIL INTERTROCHANTRIC;  Surgeon: Rod Can, MD;  Location: [facility];  Service: Orthopedics;  Laterality: Left;   PERCUTANEOUS CORONARY STENT INTERVENTION (PCI-S) N/A 01/02/2012   Procedure: PERCUTANEOUS CORONARY STENT INTERVENTION (PCI-S);  Surgeon: Sherren Mocha, MD;  Location[facility] C[facility] LAB;  Service: Cardiovascular;  Laterality: N/A;   Social History   Socioeconomic History   Marital status: Widowed    Spouse name: Not on file   Number of children: Not on file   Years of education: Not on file   Highest education level: Not on file  Occupational History   Not on file  Tobacco Use   Smoking status: Never    Passive exposure: Yes   Smokeless tobacco: Never  Substance and Sexual Activity   Alcohol use: Yes    Alcohol/week: 0.0 standard drinks    Comment: very seldom   Drug use: No   Sexual activity: Not Currently  Other Topics Concern   Not on file  Social History Narrative   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      She likes to read and she takes classes at [employer] Strain: Low Risk    Difficulty of Paying Living Expenses: Not hard at all  Food Insecurity: No Food Insecurity   Worried About Charity fundraiser in the Last Year: Never true   Arboriculturist in the Last Year: Never true  Transportation Needs:  No Transportation Needs   Lack of Transportation (Medical): No   Lack of Transportation (Non-Medical): No  Physical Activity: Inactive   Days of Exercise per Week: 0 days   Minutes of Exercise per Session: 0 min  Stress: No Stress Concern Present   Feeling of Stress : Not at all  Social Connections: Moderately Integrated   Frequency of Communication with Friends and Family: More than three times a week   Frequency of Social Gatherings with Friends and Family: More than three times a week   Attends Religious Services: More than 4 times per year   Active Member of [employer] or Organizations: Yes   Attends Archivist Meetings: More than 4 times per year   Marital Status: Widowed   Allergies  Allergen Reactions   Statins Other (See Comments)    Muscle aches   Gabapentin Other (See Comments)    SOMNOLENCE   Family History  Problem Relation Age of Onset   COPD Father    Cancer Father        lung cancer   Lung cancer Other      Current Outpatient Medications (Cardiovascular):    amLODipine (NORVASC) 5 MG tablet, Take 5 mg by mouth daily.   carvedilol (COREG) 3.125 MG tablet, TAKE 1 TABLET(3.125 MG) BY MOUTH TWICE DAILY   ezetimibe (ZETIA) 10 MG tablet, TAKE 1 TABLET(10 MG) BY MOUTH DAILY   furosemide (LASIX) 20 MG tablet, Take 1 tablet (20 mg total) by mouth daily.   Current Outpatient Medications (Analgesics):    acetaminophen (TYLENOL) 500 MG tablet, Take 2 tablets (1,000 mg total) by mouth 4 (four) times daily.   aspirin EC 81 MG tablet, Take 81 mg by mouth daily. Swallow whole.   Current Outpatient Medications (Other):    bimatoprost (LUMIGAN) 0.01 % SOLN, Place 1 drop into both eyes at bedtime.    Calcium Carb-Cholecalciferol 500-400 MG-UNIT TABS, Take 1 tablet by mouth daily.   COVID-19 mRNA bivalent vaccine, Pfizer, (PFIZER COVID-19 VAC BIVALENT) injection, Inject into the muscle.   doxycycline (VIBRAMYCIN) 100 MG capsule, Take 1 capsule (100 mg total) by mouth 2  (two) times daily.   Misc Natural Products (TART CHERRY ADVANCED PO), Take by mouth.   Multiple Vitamin (MULTIVITAMIN) tablet, Take 1 tablet by mouth daily.   Multiple Vitamins-Minerals (PRESERVISION AREDS 2+MULTI VIT PO), Take 2 capsules by mouth in the morning and at bedtime.   Reviewed prior external information including notes and imaging from  primary care provider As well as notes that were available from care everywhere and other healthcare systems.  Past medical history, social, surgical and family history all reviewed in electronic medical record.  No pertanent information unless stated regarding to the chief complaint.   Review of Systems:  No headache, visual changes, nausea, vomiting, diarrhea, constipation, dizziness, abdominal pain, skin rash, fevers, chills, night sweats, weight loss, swollen lymph nodes, body aches, joint swelling, chest pain, shortness of breath, mood changes. POSITIVE muscle aches  Objective  Blood pressure (!) 148/64, pulse 75, height '4\' 11"'$  (1.499 m), weight 142 lb (64.4 kg), SpO2 94 %.   General: No apparent distress alert and oriented x3 mood and affect normal, dressed appropriately.  HEENT: Pupils equal, extraocular movements intact  Respiratory: Patient's speak in full sentences and does not appear short of breath  Cardiovascular: 1+ lower extremity edema, non tender, no erythema  Gait severely antalgic with patient has some weakness.  Does have significant scoliosis of the lumbar spine as well. MSK: Patient's right shoulder does have effusion noted.  No crepitus noted.  Does have a significant weakness of the shoulder.  But some voluntary guarding secondary to pain  Procedure: Real-time Ultrasound Guided Injection of right glenohumeral joint Device: GE Logiq Q7  Ultrasound guided injection is preferred based studies that show increased duration, increased effect, greater accuracy, decreased procedural pain, increased response rate with ultrasound  guided versus blind injection.  Verbal informed consent obtained.  Time-out conducted.  Noted no overlying erythema, induration, or other signs of local infection.  Skin prepped in a sterile fashion.  Local anesthesia: Topical Ethyl chloride.  With sterile technique and under real time ultrasound guidance:  Joint visualized.  21-gauge 2 inch needle inserted into approach. Pictures taken for needle placement. Patient did have injection of 2 cc of 0.5% Marcaine, and aspirated 55 cc of straw-colored fluid with mild blood-tinged then injected and 1.0 cc of Kenalog 40 mg/dL. Completed without difficulty  Pain immediately resolved suggesting accurate placement of the medication.  Advised to call if fevers/chills, erythema, induration, drainage, or persistent bleeding.  Impression: Technically successful ultrasound guided injection.    Impression and Recommendations:     The above documentation has been reviewed and is accurate and complete Lyndal Pulley, DO

## 2021-09-04 ENCOUNTER — Telehealth: Payer: Self-pay | Admitting: Pharmacist

## 2021-09-04 NOTE — Chronic Care Management (AMB) (Signed)
Chronic Care Management Pharmacy Assistant   Name: Sara Garza  MRN: [id number] DOB: [birth year]  Reason for Encounter: Disease State   Conditions to be addressed/monitored: HTN  Recent office visits:  08/20/21 Sara Ped RN - Patient presented for CCM Nurse Visit  08/07/21 [name], [name], MD - Patient presented for Lymphadenitis acute and other concerns. Stopped Cephalexin 500 mg.   Recent consult visits:  None  Hospital visits:  None in previous 6 months  Medications: Outpatient Encounter Medications as of 09/04/2021  Medication Sig   acetaminophen (TYLENOL) 500 MG tablet Take 2 tablets (1,000 mg total) by mouth 4 (four) times daily.   amLODipine (NORVASC) 5 MG tablet Take 5 mg by mouth daily.   aspirin EC 81 MG tablet Take 81 mg by mouth daily. Swallow whole.   bimatoprost (LUMIGAN) 0.01 % SOLN Place 1 drop into both eyes at bedtime.    Calcium Carb-Cholecalciferol 500-400 MG-UNIT TABS Take 1 tablet by mouth daily.   carvedilol (COREG) 3.125 MG tablet TAKE 1 TABLET(3.125 MG) BY MOUTH TWICE DAILY   COVID-19 mRNA bivalent vaccine, Pfizer, (PFIZER COVID-19 VAC BIVALENT) injection Inject into the muscle.   doxycycline (VIBRAMYCIN) 100 MG capsule Take 1 capsule (100 mg total) by mouth 2 (two) times daily.   ezetimibe (ZETIA) 10 MG tablet TAKE 1 TABLET(10 MG) BY MOUTH DAILY   furosemide (LASIX) 20 MG tablet Take 1 tablet (20 mg total) by mouth daily.   Misc Natural Products (TART CHERRY ADVANCED PO) Take by mouth.   Multiple Vitamin (MULTIVITAMIN) tablet Take 1 tablet by mouth daily.   Multiple Vitamins-Minerals (PRESERVISION AREDS 2+MULTI VIT PO) Take 2 capsules by mouth in the morning and at bedtime.   No facility-administered encounter medications on file as of 09/04/2021.  Reviewed chart prior to disease state call. Spoke with patient regarding BP  Recent Office Vitals: BP Readings from Last 3 Encounters:  08/07/21 138/70  06/27/21 (!) 132/52  06/15/21 138/60    Pulse Readings from Last 3 Encounters:  08/07/21 87  06/27/21 78  06/15/21 79    Wt Readings from Last 3 Encounters:  08/07/21 140 lb (63.5 kg)  06/27/21 141 lb (64 kg)  06/12/21 143 lb (64.9 kg)     Kidney Function Lab Results  Component Value Date/Time   CREATININE 1.59 (H) 04/18/2021 11:47 AM   CREATININE 1.63 (H) 02/16/2021 03:52 PM   CREATININE 1.38 (H) 07/31/2020 04:10 PM   CREATININE 1.86 (H) 03/14/2020 10:31 AM   GFR 23.61 (L) 04/05/2020 01:10 PM   GFRNONAA 23 (L) 03/07/2020 11:18 AM   GFRAA 27 (L) 03/07/2020 11:18 AM       Latest Ref Rng & Units 04/18/2021   11:47 AM 02/16/2021    3:52 PM 07/31/2020    4:10 PM  BMP  Glucose 70 - 99 mg/dL 110   85   70    BUN 10 - 36 mg/dL 45   43   41    Creatinine 0.57 - 1.00 mg/dL 1.59   1.63   1.38    BUN/Creat Ratio 12 - '28 28   26   30    '$ Sodium 134 - 144 mmol/L 142   143   145    Potassium 3.5 - 5.2 mmol/L 4.6   4.6   5.0    Chloride 96 - 106 mmol/L 106   106   106    CO2 20 - 29 mmol/L 23   27   25  Calcium 8.7 - 10.3 mg/dL 9.3   9.7   9.5      Current antihypertensive regimen:  Amlodipine 5 mg 1 tablet daily - in AM Carvedilol 3.125 mg twice daily  How often are you checking your Blood Pressure? infrequently Current home BP readings: Patient reports she has a blood pressure machine but does not use nor check she denies any hypotensive symptoms. BP Readings from Last 3 Encounters:  08/07/21 138/70  06/27/21 (!) 132/52  06/15/21 138/60   What recent interventions/DTPs have been made by any provider to improve Blood Pressure control since last CPP Visit: Patient reports no changes Any recent hospitalizations or ED visits since last visit with CPP? No Patient reports she has been having some shoulder pain and issues she reports she has an appointment on tomorrow with [facility] Tamala Julian and hoping for resolution. She reports no needs or issues at this time.  Adherence Review: Is the patient currently on ACE/ARB  medication? No Does the patient have >5 day gap between last estimated fill dates? No    Care Gaps: Urine Micro - Overdue Foot Exam - Overdue Eye Exam - Overdue HGB A1C - Overdue AWV-  1/23 CCM- 1/24  Star Rating Drugs: None   Sara Garza [facility] Clinical Pharmacist Assistant [phone]

## 2021-09-05 ENCOUNTER — Ambulatory Visit: Payer: Self-pay

## 2021-09-05 ENCOUNTER — Encounter: Payer: Self-pay | Admitting: Family Medicine

## 2021-09-05 ENCOUNTER — Ambulatory Visit (INDEPENDENT_AMBULATORY_CARE_PROVIDER_SITE_OTHER): Payer: Medicare Other | Admitting: Family Medicine

## 2021-09-05 VITALS — BP 148/64 | HR 75 | Ht 59.0 in | Wt 142.0 lb

## 2021-09-05 DIAGNOSIS — M25511 Pain in right shoulder: Secondary | ICD-10-CM | POA: Diagnosis not present

## 2021-09-05 DIAGNOSIS — M12811 Other specific arthropathies, not elsewhere classified, right shoulder: Secondary | ICD-10-CM | POA: Diagnosis not present

## 2021-09-05 DIAGNOSIS — I25119 Atherosclerotic heart disease of native coronary artery with unspecified angina pectoris: Secondary | ICD-10-CM

## 2021-09-05 DIAGNOSIS — M75101 Unspecified rotator cuff tear or rupture of right shoulder, not specified as traumatic: Secondary | ICD-10-CM | POA: Diagnosis not present

## 2021-09-05 NOTE — Patient Instructions (Signed)
See you in 3 weeks for PRP if you need it Ok to double book Drained shoulder today

## 2021-09-05 NOTE — Assessment & Plan Note (Signed)
Aspiration done again today.  Tolerated the procedure well.  Hopefully patient will continue to make some improvement.  Unfortunately if not we discussed other treatment options.  Due to patient's age and comorbidities she would be a high risk surgical patients and would likely hopefully avoid this.  Made changes to help patient walks on the walker and hopefully that will be more beneficial as well.  Follow-up with me again in 4 weeks and can consider PRP.  Otherwise see me and we can do the aspiration injection appropriately every 8 weeks

## 2021-09-11 ENCOUNTER — Other Ambulatory Visit: Payer: Self-pay | Admitting: Cardiovascular Disease

## 2021-09-11 DIAGNOSIS — I359 Nonrheumatic aortic valve disorder, unspecified: Secondary | ICD-10-CM

## 2021-09-11 DIAGNOSIS — I25119 Atherosclerotic heart disease of native coronary artery with unspecified angina pectoris: Secondary | ICD-10-CM

## 2021-09-11 DIAGNOSIS — I10 Essential (primary) hypertension: Secondary | ICD-10-CM

## 2021-09-20 NOTE — Progress Notes (Signed)
Sara Garza Phone: [phone]     [name].   I'm seeing this patient by the request  of:  [name], NP  CC: Shoulder pain follow-up  [id number]  09/05/2021 Aspiration done again today.  Tolerated the procedure well.  Hopefully patient will continue to make some improvement.  Unfortunately if not we discussed other treatment options.  Due to patient's age and comorbidities she would be a high risk surgical patients and would likely hopefully avoid this.  Made changes to help patient walks on the walker and hopefully that will be more beneficial as well.  Follow-up with me again in 4 weeks and can consider PRP.  Otherwise see me and we can do the aspiration injection appropriately every 8 weeks  Updated 09/26/2021 Sara Garza is a 88 y.o. female coming in with complaint of R shoulder pain. PRP today. Patient did not have relief from injection.        Past Medical History:  Diagnosis Date   Arthritis    CAD (coronary artery disease)    Carotid stenosis, left    50-60% stable   Diabetes mellitus    Glaucoma    Hyperlipidemia    Hypertension    Leg pain    ABIs 2/18: normal bilaterally.   Osteoporosis    Valvular heart disease    Aortic Stenosis s/p [name] 2014 // Mitral stenosis // Echo 09/2018: EF > 65, mod LVH, severe focal basal hypertrophy, RVSP 39.8, mild to mod MR, mod to severe MS (mean 9), AVR with mild AI, severe LAE (similar to prior echo)   Past Surgical History:  Procedure Laterality Date   ANOMALOUS PULMONARY VENOUS RETURN REPAIR, TOTAL     APPENDECTOMY     CARDIAC VALVE REPLACEMENT  04/29/2012   Cataracts Bilateral    CESAREAN SECTION     FOOT SURGERY     [name]   HIP ARTHROPLASTY Right 07/31/2013   Procedure: ARTHROPLASTY BIPOLAR HIP;  Surgeon: [name], MD;  Location: [facility];  Service: Orthopedics;  Laterality: Right;   INTRAMEDULLARY (IM) NAIL INTERTROCHANTERIC Left  01/01/2020   Procedure: INTRAMEDULLARY (IM) NAIL INTERTROCHANTRIC;  Surgeon: Rod Can, MD;  Location: [facility];  Service: Orthopedics;  Laterality: Left;   PERCUTANEOUS CORONARY STENT INTERVENTION (PCI-S) N/A 01/02/2012   Procedure: PERCUTANEOUS CORONARY STENT INTERVENTION (PCI-S);  Surgeon: Sherren Mocha, MD;  Location: [facility] CATH LAB;  Service: Cardiovascular;  Laterality: N/A;   Social History   Socioeconomic History   Marital status: Widowed    Spouse name: Not on file   Number of children: Not on file   Years of education: Not on file   Highest education level: Not on file  Occupational History   Not on file  Tobacco Use   Smoking status: Never    Passive exposure: Yes   Smokeless tobacco: Never  Substance and Sexual Activity   Alcohol use: Yes    Alcohol/week: 0.0 standard drinks of alcohol    Comment: very seldom   Drug use: No   Sexual activity: Not Currently  Other Topics Concern   Not on file  Social History Narrative   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      She likes to read and she takes classes at [employer]: Low Risk  (06/25/2021)  Overall Financial Resource Strain (CARDIA)    Difficulty of Paying Living Expenses: Not hard at all  Food Insecurity: No Food Insecurity (04/23/2021)   Hunger Vital Sign    Worried About Running Out of Food in the Last Year: Never true    Ran Out of Food in the Last Year: Never true  Transportation Needs: No Transportation Needs (06/25/2021)   PRAPARE - Hydrologist (Medical): No    Lack of Transportation (Non-Medical): No  Physical Activity: Inactive (04/23/2021)   Exercise Vital Sign    Days of Exercise per Week: 0 days    Minutes of Exercise per Session: 0 min  Stress: No Stress Concern Present (06/25/2021)   [place]    Feeling  of Stress : Not at all  Social Connections: Moderately Integrated (04/23/2021)   Social Connection and Isolation Panel [NHANES]    Frequency of Communication with Friends and Family: More than three times a week    Frequency of Social Gatherings with Friends and Family: More than three times a week    Attends Religious Services: More than 4 times per year    Active Member of [employer] or Organizations: Yes    Attends Archivist Meetings: More than 4 times per year    Marital Status: Widowed   Allergies  Allergen Reactions   Statins Other (See Comments)    Muscle aches   Gabapentin Other (See Comments)    SOMNOLENCE   Family History  Problem Relation Age of Onset   COPD Father    Cancer Father        lung cancer   Lung cancer Other      Current Outpatient Medications (Cardiovascular):    amLODipine (NORVASC) 5 MG tablet, TAKE 1 TABLET(5 MG) BY MOUTH DAILY   carvedilol (COREG) 3.125 MG tablet, TAKE 1 TABLET(3.125 MG) BY MOUTH TWICE DAILY   ezetimibe (ZETIA) 10 MG tablet, Take 1 tablet (10 mg total) by mouth daily. Please keep upcoming appointment for future refills. Thank you.   furosemide (LASIX) 20 MG tablet, Take 1 tablet (20 mg total) by mouth daily.   Current Outpatient Medications (Analgesics):    acetaminophen (TYLENOL) 500 MG tablet, Take 2 tablets (1,000 mg total) by mouth 4 (four) times daily.   aspirin EC 81 MG tablet, Take 81 mg by mouth daily. Swallow whole.   Current Outpatient Medications (Other):    bimatoprost (LUMIGAN) 0.01 % SOLN, Place 1 drop into both eyes at bedtime.    Calcium Carb-Cholecalciferol 500-400 MG-UNIT TABS, Take 1 tablet by mouth daily.   COVID-19 mRNA bivalent vaccine, Pfizer, (PFIZER COVID-19 VAC BIVALENT) injection, Inject into the muscle.   doxycycline (VIBRAMYCIN) 100 MG capsule, Take 1 capsule (100 mg total) by mouth 2 (two) times daily.   Misc Natural Products (TART CHERRY ADVANCED PO), Take by mouth.   Multiple Vitamin  (MULTIVITAMIN) tablet, Take 1 tablet by mouth daily.   Multiple Vitamins-Minerals (PRESERVISION AREDS 2+MULTI VIT PO), Take 2 capsules by mouth in the morning and at bedtime.     Objective  Blood pressure 110/62, pulse 78, height '4\' 11"'$  (1.499 m), SpO2 96 %.   General: No apparent distress alert and oriented x3 mood and affect normal, dressed appropriately.     Procedure: Real-time Ultrasound Guided Injection of right glenohumeral joint Device: GE Logiq Q7  Ultrasound guided injection is preferred based studies that show increased duration, increased effect, greater accuracy, decreased procedural  pain, increased response rate with ultrasound guided versus blind injection.  Verbal informed consent obtained.  Time-out conducted.  Noted no overlying erythema, induration, or other signs of local infection.  Skin prepped in a sterile fashion.  Local anesthesia: Topical Ethyl chloride.  With sterile technique and under real time ultrasound guidance:  Joint visualized.  23g 1  inch needle inserted posterior approach. Pictures taken for needle placement. Patient did have injection of 2 cc of 1% lidocaine, 2 cc of 0.5% Marcaine, with aspiration of 50 cc of blood-tinged fluid then injected 5 cc of leukocyte poor PRP Completed without difficulty  Pain immediately resolved suggesting accurate placement of the medication.  Advised to call if fevers/chills, erythema, induration, drainage, or persistent bleeding.  Impression: Technically successful ultrasound guided injection.

## 2021-09-26 ENCOUNTER — Ambulatory Visit (INDEPENDENT_AMBULATORY_CARE_PROVIDER_SITE_OTHER): Payer: Self-pay | Admitting: Family Medicine

## 2021-09-26 ENCOUNTER — Encounter: Payer: Self-pay | Admitting: Family Medicine

## 2021-09-26 ENCOUNTER — Ambulatory Visit: Payer: Self-pay

## 2021-09-26 VITALS — BP 110/62 | HR 78 | Ht 59.0 in

## 2021-09-26 DIAGNOSIS — M75101 Unspecified rotator cuff tear or rupture of right shoulder, not specified as traumatic: Secondary | ICD-10-CM

## 2021-09-26 DIAGNOSIS — M12811 Other specific arthropathies, not elsewhere classified, right shoulder: Secondary | ICD-10-CM

## 2021-09-26 DIAGNOSIS — M47816 Spondylosis without myelopathy or radiculopathy, lumbar region: Secondary | ICD-10-CM

## 2021-09-26 DIAGNOSIS — M25511 Pain in right shoulder: Secondary | ICD-10-CM

## 2021-09-26 NOTE — Patient Instructions (Signed)
No ice or IBU for 3 days Heat and Tylenol are ok See me again in 6 weeks 

## 2021-09-28 ENCOUNTER — Encounter: Payer: Self-pay | Admitting: Family Medicine

## 2021-09-28 NOTE — Assessment & Plan Note (Signed)
PRP injection given today.  Post PRP protocol given.  The patient as well as his daughter who is primary caregiver went over with the athletic trainer.  We will follow-up again in 6 to 8 weeks

## 2021-10-03 ENCOUNTER — Ambulatory Visit (INDEPENDENT_AMBULATORY_CARE_PROVIDER_SITE_OTHER): Payer: Medicare Other | Admitting: Cardiovascular Disease

## 2021-10-03 ENCOUNTER — Encounter: Payer: Self-pay | Admitting: Cardiovascular Disease

## 2021-10-03 VITALS — BP 120/60 | HR 77 | Ht 59.0 in | Wt 142.0 lb

## 2021-10-03 DIAGNOSIS — I5032 Chronic diastolic (congestive) heart failure: Secondary | ICD-10-CM | POA: Diagnosis not present

## 2021-10-03 DIAGNOSIS — I10 Essential (primary) hypertension: Secondary | ICD-10-CM | POA: Diagnosis not present

## 2021-10-03 DIAGNOSIS — I25119 Atherosclerotic heart disease of native coronary artery with unspecified angina pectoris: Secondary | ICD-10-CM

## 2021-10-03 DIAGNOSIS — I359 Nonrheumatic aortic valve disorder, unspecified: Secondary | ICD-10-CM | POA: Diagnosis not present

## 2021-10-03 DIAGNOSIS — N184 Chronic kidney disease, stage 4 (severe): Secondary | ICD-10-CM

## 2021-10-03 NOTE — Patient Instructions (Signed)
Medication Instructions:  Your physician recommends that you continue on your current medications as directed. Please refer to the Current Medication list given to you today.  *If you need a refill on your cardiac medications before your next appointment, please call your pharmacy*   Lab Work: NONE If you have labs (blood work) drawn today and your tests are completely normal, you will receive your results only by: [facility] (if you have MyChart) OR A paper copy in the mail If you have any lab test that is abnormal or we need to change your treatment, we will call you to review the results.  Testing/Procedures: ECHO (in 6 months) Your physician has requested that you have an echocardiogram. Echocardiography is a painless test that uses sound waves to create images of your heart. It provides your doctor with information about the size and shape of your heart and how well your heart's chambers and valves are working. This procedure takes approximately one hour. There are no restrictions for this procedure.  Follow-Up: At [facility], you and your health needs are our priority.  As part of our continuing mission to provide you with exceptional heart care, we have created designated Provider Care Teams.  These Care Teams include your primary Cardiologist (physician) and Advanced Practice Providers (APPs -  Physician Assistants and Nurse Practitioners) who all work together to provide you with the care you need, when you need it.  We recommend signing up for the patient portal called "MyChart".  Sign up information is provided on this After Visit Summary.  MyChart is used to connect with patients for Virtual Visits (Telemedicine).  Patients are able to view lab/test results, encounter notes, upcoming appointments, etc.  Non-urgent messages can be sent to your provider as well.   To learn more about what you can do with MyChart, go to [email or web address].    Your next appointment:   6  month(s)  The format for your next appointment:   In Person  Provider:   Sherren Mocha, MD      Important Information About Sugar

## 2021-10-03 NOTE — Progress Notes (Signed)
Cardiology Office Note:    Date:  10/03/2021   ID:  Sara Garza, DOB [birth year], MRN [id number]  PCP:  [name], NP   [facility] Cardiologist:  Sherren Mocha, MD     Referring MD: [name], NP   Chief Complaint  Patient presents with   Shortness of Breath    History of Present Illness:    Sara Garza is a 88 y.o. female with a hx of coronary artery disease, aortic stenosis, and chronic diastolic heart failure, presenting for follow-up evaluation.  She underwent [name] for treatment of severe symptomatic aortic stenosis in 2014. She was treated with a 23 mm Medtronic Corevalve through the moderate risk SurTAVI aortic stenosis trial. She also is followed for moderate coronary artery disease which was evaluated with pressure wire analysis of the RCA and LCx demonstrating no hemodynamic evidence of flow-obstruction. 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   The patient is here alone today.  She is pretty limited by shortness of breath with walking or any physical activity.  Denies orthopnea or PND.  Her leg swelling is improved from baseline.  No chest pain or pressure.  No other complaints.    Past Medical History:  Diagnosis Date   Arthritis    CAD (coronary artery disease)    Carotid stenosis, left    50-60% stable   Diabetes mellitus    Glaucoma    Hyperlipidemia    Hypertension    Leg pain    ABIs 2/18: normal bilaterally.   Osteoporosis    Valvular heart disease    Aortic Stenosis s/p [name] 2014 // Mitral stenosis // Echo 09/2018: EF > 65, mod LVH, severe focal basal hypertrophy, RVSP 39.8, mild to mod MR, mod to severe MS (mean 9), AVR with mild AI, severe LAE (similar to prior echo)    Past Surgical History:  Procedure Laterality Date   ANOMALOUS PULMONARY VENOUS RETURN REPAIR, TOTAL     APPENDECTOMY     CARDIAC VALVE REPLACEMENT   04/29/2012   Cataracts Bilateral    CESAREAN SECTION     FOOT SURGERY     [name]   HIP ARTHROPLASTY Right 07/31/2013   Procedure: ARTHROPLASTY BIPOLAR HIP;  Surgeon: [name], MD;  Location: [facility];  Service: Orthopedics;  Laterality: Right;   INTRAMEDULLARY (IM) NAIL INTERTROCHANTERIC Left 01/01/2020   Procedure: INTRAMEDULLARY (IM) NAIL INTERTROCHANTRIC;  Surgeon: Rod Can, MD;  Location: [facility];  Service: Orthopedics;  Laterality: Left;   PERCUTANEOUS CORONARY STENT INTERVENTION (PCI-S) N/A 01/02/2012   Procedure: PERCUTANEOUS CORONARY STENT INTERVENTION (PCI-S);  Surgeon: Sherren Mocha, MD;  Location: [facility] CATH LAB;  Service: Cardiovascular;  Laterality: N/A;    Current Medications: Current Meds  Medication Sig   acetaminophen (TYLENOL) 500 MG tablet Take 2 tablets (1,000 mg total) by mouth 4 (four) times daily.   amLODipine (NORVASC) 5 MG tablet TAKE 1 TABLET(5 MG) BY MOUTH DAILY   aspirin EC 81 MG tablet Take 81 mg by mouth daily. Swallow whole.   Calcium Carb-Cholecalciferol 500-400 MG-UNIT TABS Take 1 tablet by mouth daily.   carvedilol (COREG) 3.125 MG tablet TAKE 1 TABLET(3.125 MG) BY MOUTH TWICE DAILY   COVID-19 mRNA bivalent vaccine, Pfizer, (PFIZER COVID-19 VAC BIVALENT) injection Inject into the muscle.   ezetimibe (ZETIA) 10 MG tablet Take 1 tablet (10 mg total) by mouth daily. Please keep upcoming  appointment for future refills. Thank you.   furosemide (LASIX) 20 MG tablet Take 1 tablet (20 mg total) by mouth daily.   Misc Natural Products (TART CHERRY ADVANCED PO) Take by mouth.   Multiple Vitamin (MULTIVITAMIN) tablet Take 1 tablet by mouth daily.   Multiple Vitamins-Minerals (PRESERVISION AREDS 2+MULTI VIT PO) Take 2 capsules by mouth in the morning and at bedtime.     Allergies:   Statins and Gabapentin   Social History   Socioeconomic History   Marital status: Widowed    Spouse name: Not on file   Number of children: Not on file   Years of  education: Not on file   Highest education level: Not on file  Occupational History   Not on file  Tobacco Use   Smoking status: Never    Passive exposure: Yes   Smokeless tobacco: Never  Substance and Sexual Activity   Alcohol use: Yes    Alcohol/week: 0.0 standard drinks of alcohol    Comment: very seldom   Drug use: No   Sexual activity: Not Currently  Other Topics Concern   Not on file  Social History Narrative   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      She likes to read and she takes classes at [employer]: Low Risk  (06/25/2021)   Overall Financial Resource Strain (CARDIA)    Difficulty of Paying Living Expenses: Not hard at all  Food Insecurity: No Food Insecurity (04/23/2021)   Hunger Vital Sign    Worried About Running Out of Food in the Last Year: Never true    [place] in the Last Year: Never true  Transportation Needs: No Transportation Needs (06/25/2021)   PRAPARE - Hydrologist (Medical): No    Lack of Transportation (Non-Medical): No  Physical Activity: Inactive (04/23/2021)   Exercise Vital Sign    Days of Exercise per Week: 0 days    Minutes of Exercise per Session: 0 min  Stress: No Stress Concern Present (06/25/2021)   [place]    Feeling of Stress : Not at all  Social Connections: Moderately Integrated (04/23/2021)   Social Connection and Isolation Panel [NHANES]    Frequency of Communication with Friends and Family: More than three times a week    Frequency of Social Gatherings with Friends and Family: More than three times a week    Attends Religious Services: More than 4 times per year    Active Member of [employer] or Organizations: Yes    Attends Archivist Meetings: More than 4 times per year    Marital Status: Widowed     Family History:The  patient's family history includes COPD in her father; Cancer in her father; Lung cancer in an other family member.  ROS:   Please see the history of present illness.    All other systems reviewed and are negative.  EKGs/Labs/Other Studies Reviewed:    The following studies were reviewed today: Echo 12/25/20: 1. There is a contained interventricular septal aneurysm measuring 1.5cm  in depth and 0.9cm in width. Left ventricular ejection fraction, by  estimation, is 60 to 65%. The left ventricle has normal function. The left  ventricle has no regional wall motion   abnormalities. There is severe concentric left ventricular hypertrophy.  Left  ventricular diastolic parameters are consistent with Grade I  diastolic dysfunction (impaired relaxation). Elevated left ventricular  end-diastolic pressure.   2. Right ventricular systolic function is normal. The right ventricular  size is normal. There is normal pulmonary artery systolic pressure. The  estimated right ventricular systolic pressure is 57.0 mmHg.   3. Left atrial size was severely dilated.   4. The mitral valve is degenerative. Moderate mitral valve regurgitation.  Severe mitral annular calcification.   5. The aortic valve has been repaired/replaced. There is a 23 mm  CoreValve-Evolut Pro prosthetic, stented ([name]) valve present in the  aortic position.      Perivalvular Aortic regurgitation is moderate. No aortic stenosis is  present. Aortic regurgitation PHT measures 423 msec. Aortic valve area, by  VTI measures 1.94 cm. Aortic valve mean gradient measures 12.0 mmHg.  Aortic valve Vmax measures 2.31 m/s.   6. The inferior vena cava is normal in size with <50% respiratory  variability, suggesting right atrial pressure of 8 mmHg.   7. Compared to echo study date 10/13/2020, LVF remains preserved with  unchanged size of interventricular septal aneurysm which is contained.  [name] is stable with mean AVG decreased from 13.7 to 18mHg,  VMax decreased  from 2.59 to 2cm/s, AVA increased from   1.21 to 1.94cm2 and DI increased from 0.32 to 0.56. PHT remains c/w  moderate perivalvular AI which is visible by colorflow doppler on this  study. The MV is degenerative with severe MAC and moderately thickened MV  leaflets but doppler not performed to  obtain mean MVG or MVA.   EKG:  EKG is not ordered today.   Recent Labs: 02/16/2021: TSH 1.42 04/18/2021: BUN 45; Creatinine, Ser 1.59; Potassium 4.6; Sodium 142 08/07/2021: Hemoglobin 12.5; Platelets 212.0  Recent Lipid Panel    Component Value Date/Time   CHOL 203 (H) 12/01/2019 1014   TRIG 149 12/01/2019 1014   HDL 42 (L) 12/01/2019 1014   CHOLHDL 4.8 12/01/2019 1014   VLDL 41.6 (H) 11/26/2018 1201   LDLCALC 133 (H) 12/01/2019 1014   LDLDIRECT 143.0 11/26/2018 1201     Risk Assessment/Calculations:           Physical Exam:    VS:  BP 120/60   Pulse 77   Ht _0  (1.499 m)   Wt 142 lb (64.4 kg)   SpO2 95%   BMI 28.68 kg/m     Wt Readings from Last 3 Encounters:  10/03/21 142 lb (64.4 kg)  09/05/21 142 lb (64.4 kg)  08/07/21 140 lb (63.5 kg)     GEN:  Well nourished, well developed pleasant elderly woman in no acute distress HEENT: Normal NECK: No JVD; No carotid bruits LYMPHATICS: No lymphadenopathy CARDIAC: RRR, no murmurs, rubs, gallops RESPIRATORY:  Clear to auscultation without rales, wheezing or rhonchi  ABDOMEN: Soft, non-tender, non-distended MUSCULOSKELETAL:  trace bilateral pretibial edema; No deformity  SKIN: Warm and dry NEUROLOGIC:  Alert and oriented x 3 PSYCHIATRIC:  Normal affect   ASSESSMENT:    1. Aortic valve disease   2. Chronic diastolic CHF (congestive heart failure) ([place]   3. Coronary artery disease involving native coronary artery of native heart with angina pectoris ([place]   4. Essential hypertension   5. Chronic kidney disease, stage IV (severe) (HCC)    PLAN:    In order of problems listed above:  The patient is  status post [name].  I will repeat an echocardiogram in 6 months at the time of her office  visit.  Her exam remains stable and she has had very good longevity of her [name] bioprosthesis. The patient has New York Heart Association functional class III symptoms which I suspect are multifactorial in context of her very advanced age and chronic diastolic heart failure.  Her blood pressure is under optimal control.  She cannot tolerate any higher dose of furosemide and she already has trouble with the 20 mg dose with respect to frequent urination and urinary incontinence.  We will continue her current treatments. Stable without angina at present on aspirin for antiplatelet therapy.  No changes recommended Blood pressure is under excellent control on amlodipine and carvedilol. The patient's renal function most recently has been demonstrated to be stable.  Continue serial lab work when indicated.  Last labs from January of this year showed a creatinine of 1.59.     Medication Adjustments/Labs and Tests Ordered: Current medicines are reviewed at length with the patient today.  Concerns regarding medicines are outlined above.  Orders Placed This Encounter  Procedures   ECHOCARDIOGRAM COMPLETE   No orders of the defined types were placed in this encounter.   Patient Instructions  Medication Instructions:  Your physician recommends that you continue on your current medications as directed. Please refer to the Current Medication list given to you today.  *If you need a refill on your cardiac medications before your next appointment, please call your pharmacy*   Lab Work: NONE If you have labs (blood work) drawn today and your tests are completely normal, you will receive your results only by: [place] (if you have MyChart) OR A paper copy in the mail If you have any lab test that is abnormal or we need to change your treatment, we will call you to review the results.  Testing/Procedures: ECHO  (in 6 months) Your physician has requested that you have an echocardiogram. Echocardiography is a painless test that uses sound waves to create images of your heart. It provides your doctor with information about the size and shape of your heart and how well your heart's chambers and valves are working. This procedure takes approximately one hour. There are no restrictions for this procedure.  Follow-Up: At [facility], you and your health needs are our priority.  As part of our continuing mission to provide you with exceptional heart care, we have created designated Provider Care Teams.  These Care Teams include your primary Cardiologist (physician) and Advanced Practice Providers (APPs -  Physician Assistants and Nurse Practitioners) who all work together to provide you with the care you need, when you need it.  We recommend signing up for the patient portal called "MyChart".  Sign up information is provided on this After Visit Summary.  MyChart is used to connect with patients for Virtual Visits (Telemedicine).  Patients are able to view lab/test results, encounter notes, upcoming appointments, etc.  Non-urgent messages can be sent to your provider as well.   To learn more about what you can do with MyChart, go to [email or web address].    Your next appointment:   6 month(s)  The format for your next appointment:   In Person  Provider:   Sherren Mocha, MD      Important Information About Sugar         Signed, Sherren Mocha, MD  10/03/2021 2:41 PM    [place]

## 2021-10-04 ENCOUNTER — Telehealth: Payer: Self-pay | Admitting: Physical Therapy

## 2021-10-04 NOTE — Telephone Encounter (Signed)
Pt called stating that she is in a lot of pain following her R shoulder PRP injection on 09/26/21.  She reports pain is at the same level as prior to PRP injection.  She has been taking Tylenol but cannot take anti-inflammatories per cardiology.  Per AVS from her visit on 09/26/21, she was to avoid IBU and ice x 3 days.  Pt asks if she can use Voltaren gel and/or Salonpas patches and I instruct her that it is ok to use those products as she is past the 3 day mark post-PRP.  Please advise if Voltaren gel and Salonpas products are ok to use.

## 2021-10-08 ENCOUNTER — Other Ambulatory Visit: Payer: Self-pay | Admitting: Cardiovascular Disease

## 2021-10-08 DIAGNOSIS — I10 Essential (primary) hypertension: Secondary | ICD-10-CM

## 2021-10-08 DIAGNOSIS — I25119 Atherosclerotic heart disease of native coronary artery with unspecified angina pectoris: Secondary | ICD-10-CM

## 2021-10-08 DIAGNOSIS — I359 Nonrheumatic aortic valve disorder, unspecified: Secondary | ICD-10-CM

## 2021-10-09 ENCOUNTER — Ambulatory Visit: Payer: Self-pay

## 2021-10-09 DIAGNOSIS — I5032 Chronic diastolic (congestive) heart failure: Secondary | ICD-10-CM

## 2021-10-09 DIAGNOSIS — I10 Essential (primary) hypertension: Secondary | ICD-10-CM

## 2021-10-09 NOTE — Patient Instructions (Signed)
Visit Information RNCM Case closed goals met Thank you for allowing me to share the care management and care coordination services that are available to you as part of your health plan and services through your primary care provider and medical home. Please reach out to me at [phone] if the care management/care coordination team may be of assistance to you in the future.   Tvisha Schwoerer RN, BSN,CCM, CDE Care Management Coordinator [facility] [phone]   

## 2021-10-09 NOTE — Chronic Care Management (AMB) (Signed)
Chronic Care Management   CCM RN Visit Note  10/09/2021 Name: Sara Garza MRN: [id number] DOB: [birth year]  Subjective: Sara Garza is a 88 y.o. year old female who is a primary care patient of [name], NP. The care management team was consulted for assistance with disease management and care coordination needs.    Engaged with patient by telephone for follow up visit in response to provider referral for case management and/or care coordination services.   Consent to Services:  The patient was given information about Chronic Care Management services, agreed to services, and gave verbal consent prior to initiation of services.  Please see initial visit note for detailed documentation.   Patient agreed to services and verbal consent obtained.   Assessment: Review of patient past medical history, allergies, medications, health status, including review of consultants reports, laboratory and other test data, was performed as part of comprehensive evaluation and provision of chronic care management services.   SDOH (Social Determinants of Health) assessments and interventions performed:    CCM Care Plan  Allergies  Allergen Reactions   Statins Other (See Comments)    Muscle aches   Gabapentin Other (See Comments)    SOMNOLENCE    Outpatient Encounter Medications as of 10/09/2021  Medication Sig   acetaminophen (TYLENOL) 500 MG tablet Take 2 tablets (1,000 mg total) by mouth 4 (four) times daily.   amLODipine (NORVASC) 5 MG tablet TAKE 1 TABLET(5 MG) BY MOUTH DAILY   aspirin EC 81 MG tablet Take 81 mg by mouth daily. Swallow whole.   bimatoprost (LUMIGAN) 0.01 % SOLN Place 1 drop into both eyes at bedtime.  (Patient not taking: Reported on 10/03/2021)   Calcium Carb-Cholecalciferol 500-400 MG-UNIT TABS Take 1 tablet by mouth daily.   carvedilol (COREG) 3.125 MG tablet TAKE 1 TABLET(3.125 MG) BY MOUTH TWICE DAILY   COVID-19 mRNA bivalent vaccine, Pfizer, (PFIZER COVID-19 VAC  BIVALENT) injection Inject into the muscle.   doxycycline (VIBRAMYCIN) 100 MG capsule Take 1 capsule (100 mg total) by mouth 2 (two) times daily. (Patient not taking: Reported on 10/03/2021)   ezetimibe (ZETIA) 10 MG tablet TAKE 1 TABLET BY MOUTH DAILY   furosemide (LASIX) 20 MG tablet TAKE 1 TABLET(20 MG) BY MOUTH DAILY   Misc Natural Products (TART CHERRY ADVANCED PO) Take by mouth.   Multiple Vitamin (MULTIVITAMIN) tablet Take 1 tablet by mouth daily.   Multiple Vitamins-Minerals (PRESERVISION AREDS 2+MULTI VIT PO) Take 2 capsules by mouth in the morning and at bedtime.   No facility-administered encounter medications on file as of 10/09/2021.    Patient Active Problem List   Diagnosis Date Noted   AC (acromioclavicular) arthritis 06/27/2021   Right rotator cuff tear arthropathy 05/16/2021   Acute CHF (congestive heart failure) ([place]) 02/14/2020   Acute blood loss anemia 01/03/2020   Eye irritation 01/01/2020   Mitral valve stenosis    Benign essential HTN    Chronic diastolic CHF (congestive heart failure) ([place])    Hip fracture ([place]) 12/30/2019   Closed fracture of femur, intertrochanteric, left, initial encounter ([place])    Acute on chronic diastolic CHF (congestive heart failure) ([place]) 02/25/2018   S/P [name] (transcatheter aortic valve replacement) 02/25/2018   Leg pain    Hyperlipidemia    Glaucoma    Carotid stenosis, left    Arthritis    Urinary incontinence 02/12/2017   Chronic bilateral low back pain 08/12/2016   Spondylosis of lumbar joint 08/12/2016   Bilateral leg pain 08/12/2016  Neck mass 04/15/2015   Status post hip hemiarthroplasty 07/31/2013   CAD (coronary artery disease) 07/31/2013   CKD (chronic kidney disease), stage III ([place]) 07/31/2013   Left bundle branch block 07/31/2013   Severe calcific aortic stenosis 06/18/2010   Diabetic polyneuropathy ([place]) 04/16/2010   GLAUCOMA 01/06/2009   CHEST PAIN, ATYPICAL 12/16/2008   Bilateral shoulder pain 11/16/2007    OSTEOARTHRITIS 02/02/2007   Type 2 diabetes mellitus with renal manifestations, controlled ([place]) 10/24/2006   Dyslipidemia 10/02/2006   Essential hypertension 10/02/2006    Conditions to be addressed/monitored:CHF, CAD, and HTN  Care Plan : RN Care Manager Plan of Care  Updates made by Dimitri Ped, RN since 10/09/2021 12:00 AM  Completed 10/09/2021   Problem: Chronic Disease Management and Care Coordination Needs (CHF, CAD,HTN, HLD, osteoarthritis) Resolved 10/09/2021  Priority: High     Long-Range Goal: Establish Plan of Care for Chronic Disease Management Needs (CHF, CAD,HTN, HLD, Osteoarthritis) Completed 10/09/2021  Start Date: 06/25/2021  Expected End Date: 06/25/2022  Priority: High  Note:   RNCM Case closed goals met Current Barriers:  Chronic Disease Management support and education needs related to CHF, CAD, HTN, HLD, and Osteoarthritis  States that the swelling in neck is gone now.  States is still having pain in her  rt shoulder pain.  States she goes back to see Dr. Tamala Julian on 09/05/21 for her shoulder. Denies any chest pains, shortness of breath or increased swelling in her legs.  States she does not weigh daily but does about once a week.  States her B/P was good when she checked it a few days ago.  States she has friends that help her shop and do errands and her daughter is very supportive.  States she really does try to use less salt in her food and tries to eat healthy  RNCM Clinical Goal(s):  Patient will verbalize understanding of plan for management of CHF, CAD, HTN, HLD, and Osteoarthritis as evidenced by voiced adherence to plan of care verbalize basic understanding of  CHF, CAD, HTN, HLD, and Osteoarthritis disease process and self health management plan as evidenced by voiced understanding and teach back  take all medications exactly as prescribed and will call provider for medication related questions as evidenced by dispense report and pt verbalization  attend all  scheduled medical appointments: Sports Medicine 09/05/21, Cardiology 10/03/21, CCM pharmD 03/06/22, Annual Wellness Visit 04/24/22 as evidenced by medical records demonstrate Improved adherence to prescribed treatment plan for CHF, CAD, HTN, HLD, and Osteoarthritis as evidenced by readings within limits, voiced adherence to plan of care continue to work with RN Care Manager to address care management and care coordination needs related to  CHF, CAD, HTN, HLD, and Osteoarthritis as evidenced by adherence to CM Team Scheduled appointments through collaboration with RN Care manager, provider, and care team.   Interventions: 1:1 collaboration with primary care provider regarding development and update of comprehensive plan of care as evidenced by provider attestation and co-signature Inter-disciplinary care team collaboration (see longitudinal plan of care) Evaluation of current treatment plan related to  self management and patient's adherence to plan as established by provider   CAD Interventions: (Status:  Goal Met.) Long Term Goal Assessed understanding of CAD diagnosis Medications reviewed including medications utilized in CAD treatment plan Provided education on importance of blood pressure control in management of CAD Provided education on Importance of limiting foods high in cholesterol Counseled on importance of regular laboratory monitoring as prescribed Reviewed Importance of taking all  medications as prescribed Reviewed Importance of attending all scheduled provider appointments Advised to report any changes in symptoms or exercise tolerance   Heart Failure Interventions:  (Status:  Goal Met.) Long Term Goal Basic overview and discussion of pathophysiology of Heart Failure reviewed Provided education on low sodium diet Reviewed Heart Failure Action Plan in depth and provided written copy Assessed need for readable accurate scales in home Advised patient to weigh each morning after  emptying bladder Discussed importance of daily weight and advised patient to weigh and record daily Reviewed role of diuretics in prevention of fluid overload and management of heart failure; Discussed the importance of keeping all appointments with provider Assessed social determinant of health barriers  Reviewed s/sx of HF and when to call provider  Hyperlipidemia Interventions:  (Status:  Goal Met.) Long Term Goal Medication review performed; medication list updated in electronic medical record.  Provider established cholesterol goals reviewed Counseled on importance of regular laboratory monitoring as prescribed Reviewed importance of limiting foods high in cholesterol Reviewed to fried foods  Hypertension Interventions:  (Status:  Goal Met.) Long Term Goal Last practice recorded BP readings:  BP Readings from Last 3 Encounters:  08/07/21 138/70  06/27/21 (!) 132/52  06/15/21 138/60  Most recent eGFR/CrCl:  Lab Results  Component Value Date   EGFR 30 (L) 04/18/2021    No components found for: CRCL  Evaluation of current treatment plan related to hypertension self management and patient's adherence to plan as established by provider Provided education to patient re: stroke prevention, s/s of heart attack and stroke Reviewed medications with patient and discussed importance of compliance Discussed plans with patient for ongoing care management follow up and provided patient with direct contact information for care management team Advised patient, providing education and rationale, to monitor blood pressure daily and record, calling PCP for findings outside established parameters Provided education on prescribed diet low sodium heart healthy  Pain Interventions:  (Status:  Goal Met.) Long Term Goal Pain assessment performed Medications reviewed Reviewed provider established plan for pain management Discussed importance of adherence to all scheduled medical  appointments Counseled on the importance of reporting any/all new or changed pain symptoms or management strategies to pain management provider Advised patient to report to care team affect of pain on daily activities Reviewed with patient prescribed pharmacological and nonpharmacological pain relief strategies Reviewed to keep appointment with sports med on 09/05/21  Patient Goals/Self-Care Activities: Take all medications as prescribed Attend all scheduled provider appointments Call pharmacy for medication refills 3-7 days in advance of running out of medications Call provider office for new concerns or questions  call office if I gain more than 2 pounds in one day or 5 pounds in one week keep legs up while sitting use salt in moderation watch for swelling in feet, ankles and legs every day weigh myself daily follow rescue plan if symptoms flare-up eat more whole grains, fruits and vegetables, lean meats and healthy fats check blood pressure 3 times per week choose a place to take my blood pressure (home, clinic or office, retail store) keep a blood pressure log take blood pressure log to all doctor appointments limit salt intake to 2310m/day call for medicine refill 2 or 3 days before it runs out take all medications exactly as prescribed call doctor with any symptoms you believe are related to your medicine  Follow Up Plan:  The patient has been provided with contact information for the care management team and has been advised to  call with any health related questions or concerns.  No further follow up required: RNCM Case closed goals met       Plan:The patient has been provided with contact information for the care management team and has been advised to call with any health related questions or concerns.  No further follow up required: RNCM Case closed goals met Peter Garter RN, [facility], CDE Care Management Coordinator [place] [phone]

## 2021-10-31 ENCOUNTER — Ambulatory Visit: Payer: Medicare Other | Admitting: Family Medicine

## 2021-11-02 ENCOUNTER — Telehealth: Payer: Self-pay | Admitting: Pharmacist

## 2021-11-02 NOTE — Chronic Care Management (AMB) (Signed)
Chronic Care Management Pharmacy Assistant   Name: Sara Garza  MRN: [id number] DOB: 1923-04-15  Reason for Encounter: Disease State   Conditions to be addressed/monitored: HTN  Recent office visits:  11/16/21 [name], NP - Patient presented for Hyperpigmentation of skin. No medication changes. FLMA paperwork started.  10/09/21 Dimitri Ped, RN - Patient presented for  [facility] Nurse Visit. No medication changes.  Recent consult visits:  11/07/21 Lyndal Pulley, DO (Sports Med) - Patient presented for Pain in joint of right shoulder and other concerns. Steroid injection administered  10/03/21 Sherren Mocha, MD (Cardiology) - Patient presented for Aortic valve disease and other concerns. No medication changes.  09/26/21 Lyndal Pulley, DO (Sports Med) - Patient presented for Pain in joint of right shoulder and other concerns. Steroid injection administered.  09/05/21 Lyndal Pulley, DO (Sports Med) - Patient presented for Pain in joint of right shoulder and other concerns. Steroid injection administered  Hospital visits:  None in previous 6 months  Medications: Outpatient Encounter Medications as of 11/02/2021  Medication Sig   acetaminophen (TYLENOL) 500 MG tablet Take 2 tablets (1,000 mg total) by mouth 4 (four) times daily.   amLODipine (NORVASC) 5 MG tablet TAKE 1 TABLET(5 MG) BY MOUTH DAILY   aspirin EC 81 MG tablet Take 81 mg by mouth daily. Swallow whole.   bimatoprost (LUMIGAN) 0.01 % SOLN Place 1 drop into both eyes at bedtime.  (Patient not taking: Reported on 10/03/2021)   Calcium Carb-Cholecalciferol 500-400 MG-UNIT TABS Take 1 tablet by mouth daily.   carvedilol (COREG) 3.125 MG tablet TAKE 1 TABLET(3.125 MG) BY MOUTH TWICE DAILY   COVID-19 mRNA bivalent vaccine, Pfizer, (PFIZER COVID-19 VAC BIVALENT) injection Inject into the muscle.   doxycycline (VIBRAMYCIN) 100 MG capsule Take 1 capsule (100 mg total) by mouth 2 (two) times daily. (Patient not taking:  Reported on 10/03/2021)   ezetimibe (ZETIA) 10 MG tablet TAKE 1 TABLET BY MOUTH DAILY   furosemide (LASIX) 20 MG tablet TAKE 1 TABLET(20 MG) BY MOUTH DAILY   Misc Natural Products (TART CHERRY ADVANCED PO) Take by mouth.   Multiple Vitamin (MULTIVITAMIN) tablet Take 1 tablet by mouth daily.   Multiple Vitamins-Minerals (PRESERVISION AREDS 2+MULTI VIT PO) Take 2 capsules by mouth in the morning and at bedtime.   No facility-administered encounter medications on file as of 11/02/2021.   Reviewed chart prior to disease state call. Spoke with patient regarding BP  Recent Office Vitals: BP Readings from Last 3 Encounters:  10/03/21 120/60  09/26/21 110/62  09/05/21 (!) 148/64   Pulse Readings from Last 3 Encounters:  10/03/21 77  09/26/21 78  09/05/21 75    Wt Readings from Last 3 Encounters:  10/03/21 142 lb (64.4 kg)  09/05/21 142 lb (64.4 kg)  08/07/21 140 lb (63.5 kg)     Kidney Function Lab Results  Component Value Date/Time   CREATININE 1.59 (H) 04/18/2021 11:47 AM   CREATININE 1.63 (H) 02/16/2021 03:52 PM   CREATININE 1.38 (H) 07/31/2020 04:10 PM   CREATININE 1.86 (H) 03/14/2020 10:31 AM   GFR 23.61 (L) 04/05/2020 01:10 PM   GFRNONAA 23 (L) 03/07/2020 11:18 AM   GFRAA 27 (L) 03/07/2020 11:18 AM       Latest Ref Rng & Units 04/18/2021   11:47 AM 02/16/2021    3:52 PM 07/31/2020    4:10 PM  BMP  Glucose 70 - 99 mg/dL 110  85  70   BUN 10 - 36  mg/dL 45  43  41   Creatinine 0.57 - 1.00 mg/dL 1.59  1.63  1.38   BUN/Creat Ratio 12 - '28 28  26  30   '$ Sodium 134 - 144 mmol/L 142  143  145   Potassium 3.5 - 5.2 mmol/L 4.6  4.6  5.0   Chloride 96 - 106 mmol/L 106  106  106   CO2 20 - 29 mmol/L '23  27  25   '$ Calcium 8.7 - 10.3 mg/dL 9.3  9.7  9.5     Current antihypertensive regimen:  Amlodipine 5 mg 1 tablet daily - in AM Carvedilol 3.125 mg twice daily  Current home BP readings:  BP Readings from Last 3 Encounters:  10/03/21 120/60  09/26/21 110/62  09/05/21 (!) 148/64    What recent interventions/DTPs have been made by any provider to improve Blood Pressure control since last CPP Visit: Patient reports none Any recent hospitalizations or ED visits since last visit with CPP? No  Adherence Review: Is the patient currently on ACE/ARB medication? No Does the patient have >5 day gap between last estimated fill dates? No  Notes: Patient reports she is fine no changes or issues.  Care Gaps: Urine Micro - Overdue Foot Exam - Overdue COVID Booster - Overdue Eye Exam - Overdue HGB A1C - Overdue Flu Vaccine - Overdue CCM- 12/26/ CXL'd  pt req decline for future AWV- 1/23  Star Rating Drugs: None    Ned Clines [place] Clinical Pharmacist Assistant [phone]

## 2021-11-06 NOTE — Progress Notes (Unsigned)
Sara Garza Phone: [phone] Subjective:   Sara Garza, am serving as a scribe for Dr. [name].   I'm seeing this patient by the request  of:  [name], NP  CC: right shoulder pain   WIO:[id number]  09/26/2021 PRP injection given today.  Post PRP protocol given.  The patient as well as his daughter who is primary caregiver went over with the athletic trainer.  We will follow-up again in 6 to 8 weeks  Updated 11/07/2021 Sara Garza is a 88 y.o. female coming in with complaint of right shoulder pain. PRP f/u. Garza change in pain. Swelling may have decreased. Would like to discuss next steps.        Past Medical History:  Diagnosis Date   Arthritis    CAD (coronary artery disease)    Carotid stenosis, left    50-60% stable   Diabetes mellitus    Glaucoma    Hyperlipidemia    Hypertension    Leg pain    ABIs 2/18: normal bilaterally.   Osteoporosis    Valvular heart disease    Aortic Stenosis s/p [name] 2014 // Mitral stenosis // Echo 09/2018: EF > 65, mod LVH, severe focal basal hypertrophy, RVSP 39.8, mild to mod MR, mod to severe MS (mean 9), AVR with mild AI, severe LAE (similar to prior echo)   Past Surgical History:  Procedure Laterality Date   ANOMALOUS PULMONARY VENOUS RETURN REPAIR, TOTAL     APPENDECTOMY     CARDIAC VALVE REPLACEMENT  04/29/2012   Cataracts Bilateral    CESAREAN SECTION     FOOT SURGERY     [name]   HIP ARTHROPLASTY Right 07/31/2013   Procedure: ARTHROPLASTY BIPOLAR HIP;  Surgeon: [name], MD;  Location: [facility]S;  Service: Orthopedics;  Laterality: Right;   INTRAMEDULLARY (IM) NAIL INTERTROCHANTERIC Left 01/01/2020   Procedure: INTRAMEDULLARY (IM) NAIL INTERTROCHANTRIC;  Surgeon: Rod Can, MD;  Location: [facility];  Service: Orthopedics;  Laterality: Left;   PERCUTANEOUS CORONARY STENT INTERVENTION (PCI-S) N/A 01/02/2012   Procedure: PERCUTANEOUS  CORONARY STENT INTERVENTION (PCI-S);  Surgeon: Sherren Mocha, MD;  Location: [facility] CATH LAB;  Service: Cardiovascular;  Laterality: N/A;   Social History   Socioeconomic History   Marital status: Widowed    Spouse name: Not on file   Number of children: Not on file   Years of education: Not on file   Highest education level: Not on file  Occupational History   Not on file  Tobacco Use   Smoking status: Never    Passive exposure: Yes   Smokeless tobacco: Never  Substance and Sexual Activity   Alcohol use: Yes    Alcohol/week: 0.0 standard drinks of alcohol    Comment: very seldom   Drug use: Garza   Sexual activity: Not Currently  Other Topics Concern   Not on file  Social History Narrative   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      She likes to read and she takes classes at [employer]: Low Risk  (06/25/2021)   Overall Financial Resource Strain (CARDIA)    Difficulty of Paying Living Expenses: Not hard at all  Food Insecurity: Garza Food Insecurity (04/23/2021)   Hunger Vital Sign    Worried About Running Out of Food in the  Last Year: Never true    [place] in the Last Year: Never true  Transportation Needs: Garza Transportation Needs (06/25/2021)   PRAPARE - Hydrologist (Medical): Garza    Lack of Transportation (Non-Medical): Garza  Physical Activity: Inactive (04/23/2021)   Exercise Vital Sign    Days of Exercise per Week: 0 days    Minutes of Exercise per Session: 0 min  Stress: Garza Stress Concern Present (06/25/2021)   [place]    Feeling of Stress : Not at all  Social Connections: Moderately Integrated (04/23/2021)   Social Connection and Isolation Panel [NHANES]    Frequency of Communication with Friends and Family: More than three times a week    Frequency of Social Gatherings with  Friends and Family: More than three times a week    Attends Religious Services: More than 4 times per year    Active Member of [employer] or Organizations: Yes    Attends Archivist Meetings: More than 4 times per year    Marital Status: Widowed   Allergies  Allergen Reactions   Statins Other (See Comments)    Muscle aches   Gabapentin Other (See Comments)    SOMNOLENCE   Family History  Problem Relation Age of Onset   COPD Father    Cancer Father        lung cancer   Lung cancer Other      Current Outpatient Medications (Cardiovascular):    amLODipine (NORVASC) 5 MG tablet, TAKE 1 TABLET(5 MG) BY MOUTH DAILY   carvedilol (COREG) 3.125 MG tablet, TAKE 1 TABLET(3.125 MG) BY MOUTH TWICE DAILY   ezetimibe (ZETIA) 10 MG tablet, TAKE 1 TABLET BY MOUTH DAILY   furosemide (LASIX) 20 MG tablet, TAKE 1 TABLET(20 MG) BY MOUTH DAILY   Current Outpatient Medications (Analgesics):    acetaminophen (TYLENOL) 500 MG tablet, Take 2 tablets (1,000 mg total) by mouth 4 (four) times daily.   aspirin EC 81 MG tablet, Take 81 mg by mouth daily. Swallow whole.   Current Outpatient Medications (Other):    bimatoprost (LUMIGAN) 0.01 % SOLN, Place 1 drop into both eyes at bedtime.   Calcium Carb-Cholecalciferol 500-400 MG-UNIT TABS, Take 1 tablet by mouth daily.   COVID-19 mRNA bivalent vaccine, Pfizer, (PFIZER COVID-19 VAC BIVALENT) injection, Inject into the muscle.   doxycycline (VIBRAMYCIN) 100 MG capsule, Take 1 capsule (100 mg total) by mouth 2 (two) times daily.   Misc Natural Products (TART CHERRY ADVANCED PO), Take by mouth.   Multiple Vitamin (MULTIVITAMIN) tablet, Take 1 tablet by mouth daily.   Multiple Vitamins-Minerals (PRESERVISION AREDS 2+MULTI VIT PO), Take 2 capsules by mouth in the morning and at bedtime.     Objective  Blood pressure 134/66, pulse 82, height '4\' 11"'$  (1.499 m), weight 140 lb (63.5 kg), SpO2 93 %.   General: Garza apparent distress alert and oriented x3  mood and affect normal, dressed appropriately.  HEENT: Pupils equal, extraocular movements intact  Respiratory: Patient's speak in full sentences and does not appear short of breath  Cardiovascular: Garza lower extremity edema, non tender, Garza erythema  Patient walks with the aid of a walker. Right shoulder does have swelling noted again.  Garza severe crepitus noted. 2 out of 5 strength noted.  Procedure: Real-time Ultrasound Guided Injection of right glenohumeral joint Device: GE Logiq Q7 Ultrasound guided injection is preferred based studies that show increased duration, increased effect,  greater accuracy, decreased procedural pain, increased response rate, and decreased cost with ultrasound guided versus blind injection.  Verbal informed consent obtained.  Time-out conducted.  Noted Garza overlying erythema, induration, or other signs of local infection.  Skin prepped in a sterile fashion.  Local anesthesia: Topical Ethyl chloride.  With sterile technique and under real time ultrasound guidance: With an 18-gauge 1-1/2 inch needle injected with 0.5 cc of 0.5% Marcaine and then aspirated 100 cc of blood-tinged fluid.  Injected 1 cc of Kenalog 40 mg/mL Completed without difficulty  Pain immediately improved suggesting accurate placement of the medication.  Advised to call if fevers/chills, erythema, induration, drainage, or persistent bleeding.  Impression: Technically successful ultrasound guided injection.    Impression and Recommendations:    The above documentation has been reviewed and is accurate and complete Lyndal Pulley, DO

## 2021-11-07 ENCOUNTER — Ambulatory Visit (INDEPENDENT_AMBULATORY_CARE_PROVIDER_SITE_OTHER): Payer: Medicare Other | Admitting: Family Medicine

## 2021-11-07 ENCOUNTER — Encounter: Payer: Self-pay | Admitting: Family Medicine

## 2021-11-07 ENCOUNTER — Ambulatory Visit: Payer: Self-pay

## 2021-11-07 VITALS — BP 134/66 | HR 82 | Ht 59.0 in | Wt 140.0 lb

## 2021-11-07 DIAGNOSIS — I25119 Atherosclerotic heart disease of native coronary artery with unspecified angina pectoris: Secondary | ICD-10-CM

## 2021-11-07 DIAGNOSIS — M25511 Pain in right shoulder: Secondary | ICD-10-CM | POA: Diagnosis not present

## 2021-11-07 DIAGNOSIS — M75101 Unspecified rotator cuff tear or rupture of right shoulder, not specified as traumatic: Secondary | ICD-10-CM

## 2021-11-07 DIAGNOSIS — M12811 Other specific arthropathies, not elsewhere classified, right shoulder: Secondary | ICD-10-CM

## 2021-11-07 NOTE — Assessment & Plan Note (Signed)
Aspiration done again today.  At this point we are talking about conservative therapy.  We discussed the idea of every 6 weeks alternating between Kenalog and Toradol and continuing aspirations to help with patient's pain.  Patient is in agreement with this.  We discussed that we would have her follow-up again in 6 weeks.  Total time discussing other possibilities such as surgical intervention, for oral medications such as Cymbalta 33 minutes.

## 2021-11-07 NOTE — Patient Instructions (Addendum)
Drained shoulder today See me again in 5-6 weeks

## 2021-11-12 ENCOUNTER — Telehealth: Payer: Medicare Other

## 2021-11-16 ENCOUNTER — Encounter: Payer: Self-pay | Admitting: Adult Health

## 2021-11-16 ENCOUNTER — Ambulatory Visit (INDEPENDENT_AMBULATORY_CARE_PROVIDER_SITE_OTHER): Payer: Medicare Other | Admitting: Adult Health

## 2021-11-16 VITALS — BP 120/60 | HR 85 | Temp 98.2°F | Ht 59.0 in | Wt 139.0 lb

## 2021-11-16 DIAGNOSIS — L819 Disorder of pigmentation, unspecified: Secondary | ICD-10-CM | POA: Diagnosis not present

## 2021-11-16 DIAGNOSIS — I25119 Atherosclerotic heart disease of native coronary artery with unspecified angina pectoris: Secondary | ICD-10-CM

## 2021-11-16 NOTE — Progress Notes (Signed)
Subjective:    Patient ID: Sara Garza, female    DOB: [birth year], 88 y.o.   MRN: [id number]  HPI 88 year old female who  has a past medical history of Arthritis, CAD (coronary artery disease), Carotid stenosis, left, Diabetes mellitus, Glaucoma, Hyperlipidemia, Hypertension, Leg pain, Osteoporosis, and Valvular heart disease.  She presents to the office today with her daughter.   Her first issue is that of follow up for cellulitis of her left leg that she had back in March 2023. At this time she reported that she accidentally slammed her leg in the car door. She had apparent cellulitis and was treated with doxycycline. She reports that the infection cleared up but she is left with a bruise and is wondering if this will go away.   Her daughter needs FMLA paperwork filled out so that she can get time off work to take her mom  to her medical appointments.   Review of Systems See HPI   Past Medical History:  Diagnosis Date   Arthritis    CAD (coronary artery disease)    Carotid stenosis, left    50-60% stable   Diabetes mellitus    Glaucoma    Hyperlipidemia    Hypertension    Leg pain    ABIs 2/18: normal bilaterally.   Osteoporosis    Valvular heart disease    Aortic Stenosis s/p [name] 2014 // Mitral stenosis // Echo 09/2018: EF > 65, mod LVH, severe focal basal hypertrophy, RVSP 39.8, mild to mod MR, mod to severe MS (mean 9), AVR with mild AI, severe LAE (similar to prior echo)    Social History   Socioeconomic History   Marital status: Widowed    Spouse name: Not on file   Number of children: Not on file   Years of education: Not on file   Highest education level: Not on file  Occupational History   Not on file  Tobacco Use   Smoking status: Never    Passive exposure: Yes   Smokeless tobacco: Never  Substance and Sexual Activity   Alcohol use: Yes    Alcohol/week: 0.0 standard drinks of alcohol    Comment: very seldom   Drug use: No   Sexual activity: Not  Currently  Other Topics Concern   Not on file  Social History Narrative   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      She likes to read and she takes classes at [employer]: Low Risk  (06/25/2021)   Overall Financial Resource Strain (CARDIA)    Difficulty of Paying Living Expenses: Not hard at all  Food Insecurity: No Food Insecurity (04/23/2021)   Hunger Vital Sign    Worried About Running Out of Food in the Last Year: Never true    [place] in the Last Year: Never true  Transportation Needs: No Transportation Needs (06/25/2021)   PRAPARE - Hydrologist (Medical): No    Lack of Transportation (Non-Medical): No  Physical Activity: Inactive (04/23/2021)   Exercise Vital Sign    Days of Exercise per Week: 0 days    Minutes of Exercise per Session: 0 min  Stress: No Stress Concern Present (06/25/2021)   [place]    Feeling of Stress : Not at all  Social  Connections: Moderately Integrated (04/23/2021)   Social Connection and Isolation Panel [NHANES]    Frequency of Communication with Friends and Family: More than three times a week    Frequency of Social Gatherings with Friends and Family: More than three times a week    Attends Religious Services: More than 4 times per year    Active Member of [employer] or Organizations: Yes    Attends Archivist Meetings: More than 4 times per year    Marital Status: Widowed  Intimate Partner Violence: Not At Risk (04/23/2021)   Humiliation, Afraid, Rape, and Kick questionnaire    Fear of Current or Ex-Partner: No    Emotionally Abused: No    Physically Abused: No    Sexually Abused: No    Past Surgical History:  Procedure Laterality Date   ANOMALOUS PULMONARY VENOUS RETURN REPAIR, TOTAL     APPENDECTOMY     CARDIAC VALVE REPLACEMENT   04/29/2012   Cataracts Bilateral    CESAREAN SECTION     FOOT SURGERY     [name]   HIP ARTHROPLASTY Right 07/31/2013   Procedure: ARTHROPLASTY BIPOLAR HIP;  Surgeon: [name], MD;  Location: [facility];  Service: Orthopedics;  Laterality: Right;   INTRAMEDULLARY (IM) NAIL INTERTROCHANTERIC Left 01/01/2020   Procedure: INTRAMEDULLARY (IM) NAIL INTERTROCHANTRIC;  Surgeon: Rod Can, MD;  Location: [facility];  Service: Orthopedics;  Laterality: Left;   PERCUTANEOUS CORONARY STENT INTERVENTION (PCI-S) N/A 01/02/2012   Procedure: PERCUTANEOUS CORONARY STENT INTERVENTION (PCI-S);  Surgeon: Sherren Mocha, MD;  Location: [facility] CATH [facility];  Service: Cardiovascular;  Laterality: N/A;    Family History  Problem Relation Age of Onset   COPD Father    Cancer Father        lung cancer   Lung cancer Other     Allergies  Allergen Reactions   Statins Other (See Comments)    Muscle aches   Gabapentin Other (See Comments)    SOMNOLENCE    Current Outpatient Medications on File Prior to Visit  Medication Sig Dispense Refill   acetaminophen (TYLENOL) 500 MG tablet Take 2 tablets (1,000 mg total) by mouth 4 (four) times daily. 30 tablet 0   amLODipine (NORVASC) 5 MG tablet TAKE 1 TABLET(5 MG) BY MOUTH DAILY 90 tablet 2   aspirin EC 81 MG tablet Take 81 mg by mouth daily. Swallow whole.     bimatoprost (LUMIGAN) 0.01 % SOLN Place 1 drop into both eyes at bedtime.     Calcium Carb-Cholecalciferol 500-400 MG-UNIT TABS Take 1 tablet by mouth daily.     carvedilol (COREG) 3.125 MG tablet TAKE 1 TABLET(3.125 MG) BY MOUTH TWICE DAILY 180 tablet 2   COVID-19 mRNA bivalent vaccine, Pfizer, (PFIZER COVID-19 VAC BIVALENT) injection Inject into the muscle. 0.3 mL 0   doxycycline (VIBRAMYCIN) 100 MG capsule Take 1 capsule (100 mg total) by mouth 2 (two) times daily. 20 capsule 0   ezetimibe (ZETIA) 10 MG tablet TAKE 1 TABLET BY MOUTH DAILY 90 tablet 1   furosemide (LASIX) 20 MG tablet TAKE 1 TABLET(20 MG) BY  MOUTH DAILY 90 tablet 1   Misc Natural Products (TART CHERRY ADVANCED PO) Take by mouth.     Multiple Vitamin (MULTIVITAMIN) tablet Take 1 tablet by mouth daily.     Multiple Vitamins-Minerals (PRESERVISION AREDS 2+MULTI VIT PO) Take 2 capsules by mouth in the morning and at bedtime.     No current facility-administered medications on file prior to visit.    BP 120/60  Pulse 85   Temp 98.2 F (36.8 C) (Oral)   Ht '4\' 11"'$  (1.499 m)   Wt 139 lb (63 kg)   SpO2 96%   BMI 28.07 kg/m       Objective:   Physical Exam Vitals and nursing note reviewed.  Constitutional:      Appearance: Normal appearance.  Cardiovascular:     Rate and Rhythm: Normal rate and regular rhythm.     Pulses: Normal pulses.     Heart sounds: Normal heart sounds.  Pulmonary:     Effort: Pulmonary effort is normal.     Breath sounds: Normal breath sounds.  Musculoskeletal:     Right lower leg: Edema present.     Left lower leg: Edema present.  Skin:    General: Skin is warm and dry.     Capillary Refill: Capillary refill takes less than 2 seconds.     Comments: Silver dollar sized hyperpigmented area on left lower leg   Neurological:     General: No focal deficit present.     Mental Status: She is alert and oriented to person, place, and time.  Psychiatric:        Mood and Affect: Mood normal.        Behavior: Behavior normal.        Thought Content: Thought content normal.        Judgment: Judgment normal.       Assessment & Plan:  1. Hyperpigmentation of skin - Advised that her skin is darker after the cellulitis and that this is relatively normal. This will fade with time   FMLA paperwork will be filled out and daughter will be notified when it is complete

## 2021-12-05 ENCOUNTER — Ambulatory Visit: Payer: Medicare Other | Admitting: Family Medicine

## 2021-12-15 IMAGING — CR DG FEMUR 2+V*L*
4 series · 4 of 4 positions shown · non-contrast
Comparison: X-ray pelvis 07/31/2013.

CLINICAL DATA: Pt c/o left groin pain s/p fall today while carrying
trash to dumpster.

EXAM:
PELVIS - 1-2 VIEW; DG HIP (WITH OR WITHOUT PELVIS) 2-3V LEFT; LEFT
FEMUR 2 VIEWS

[w hip lat left]
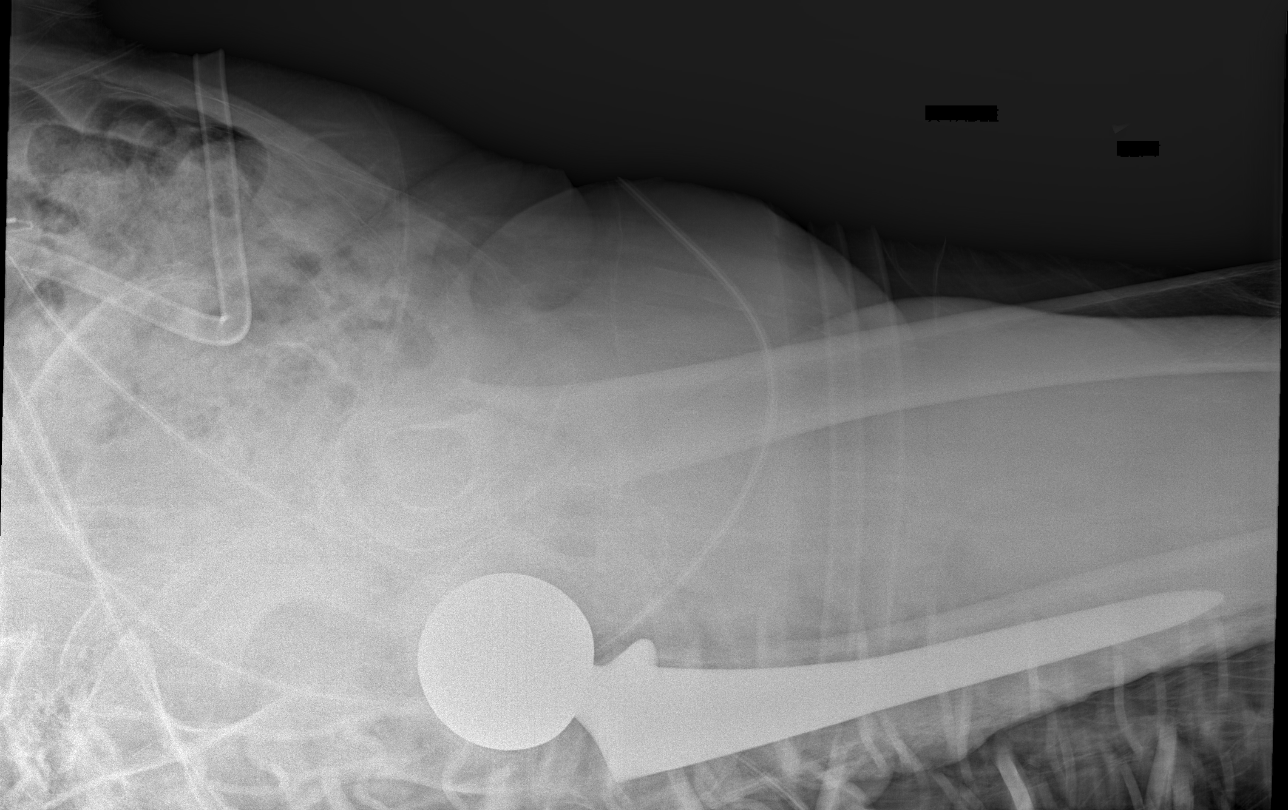

[x femur distal lat left (1 of 3)]
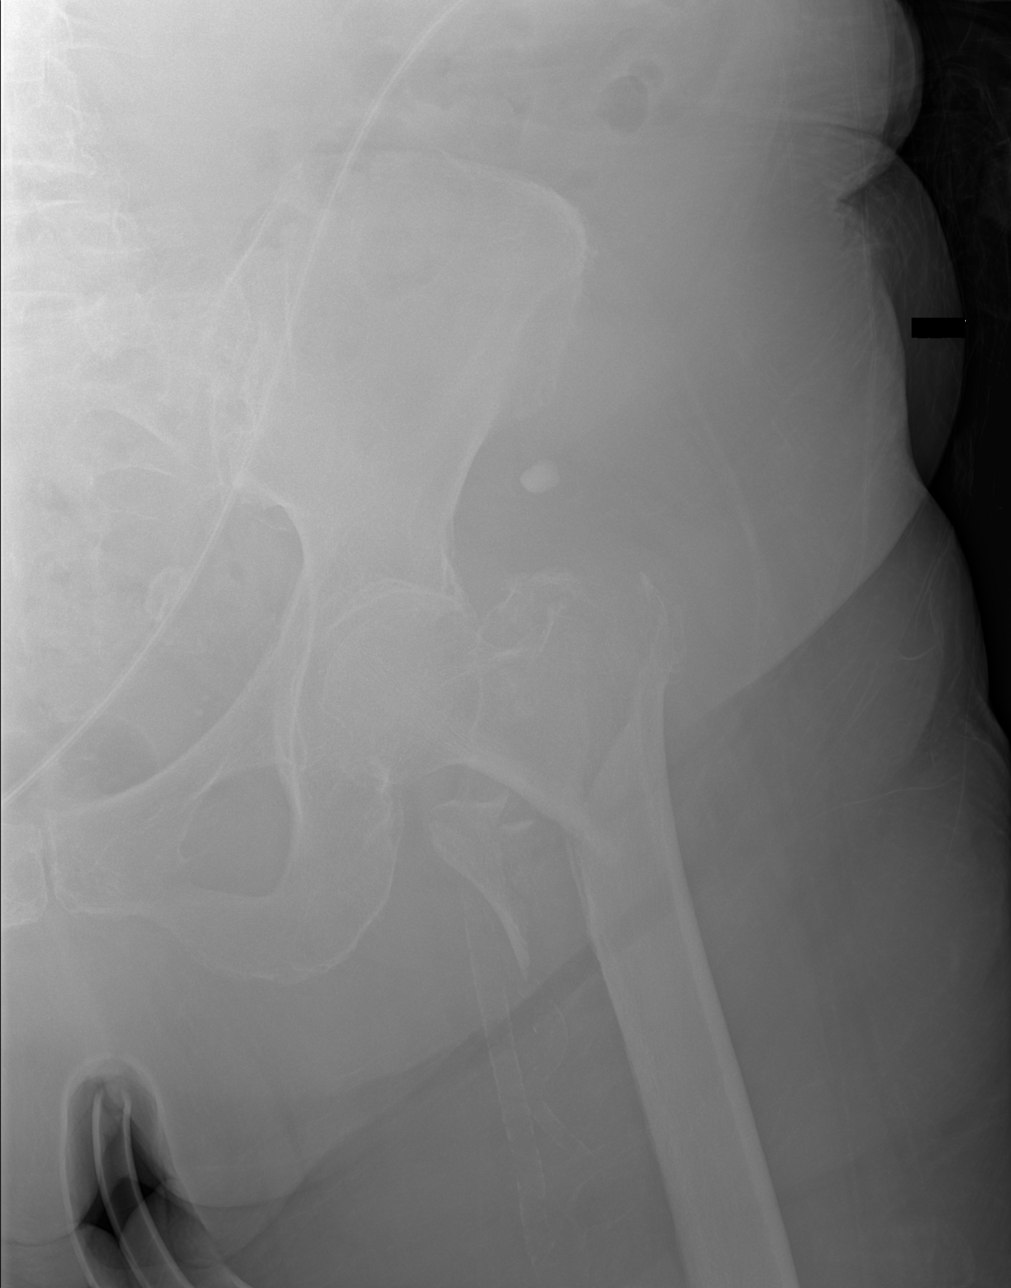

[x femur distal lat left (2 of 3)]
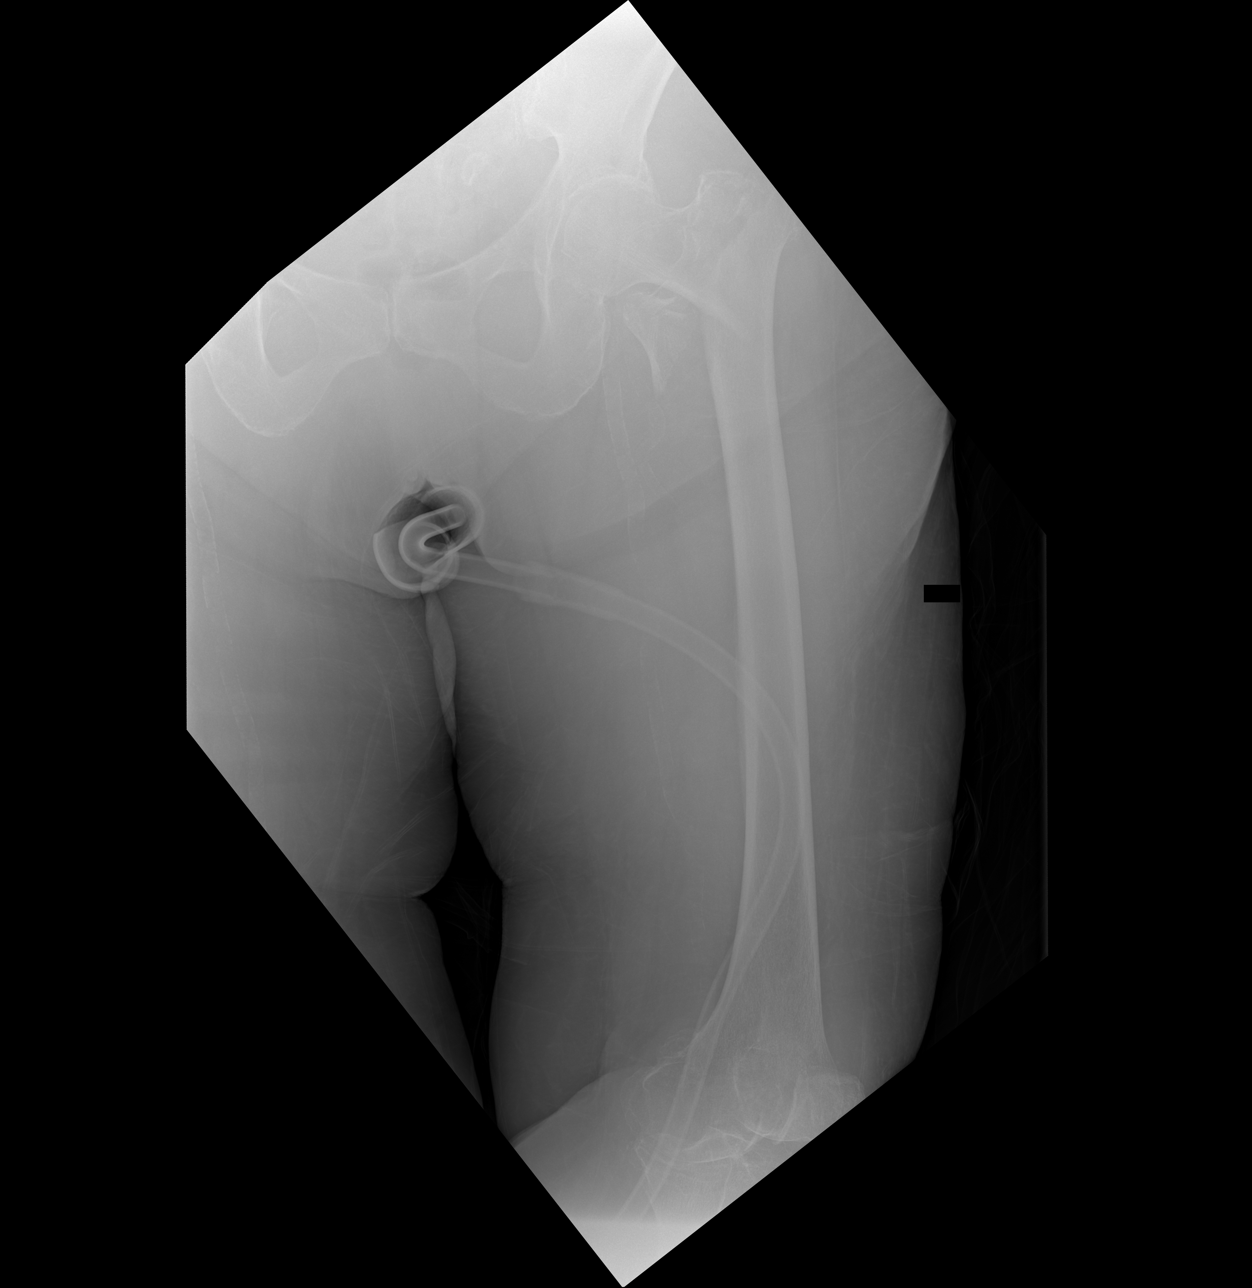

[x femur distal lat left (3 of 3)]
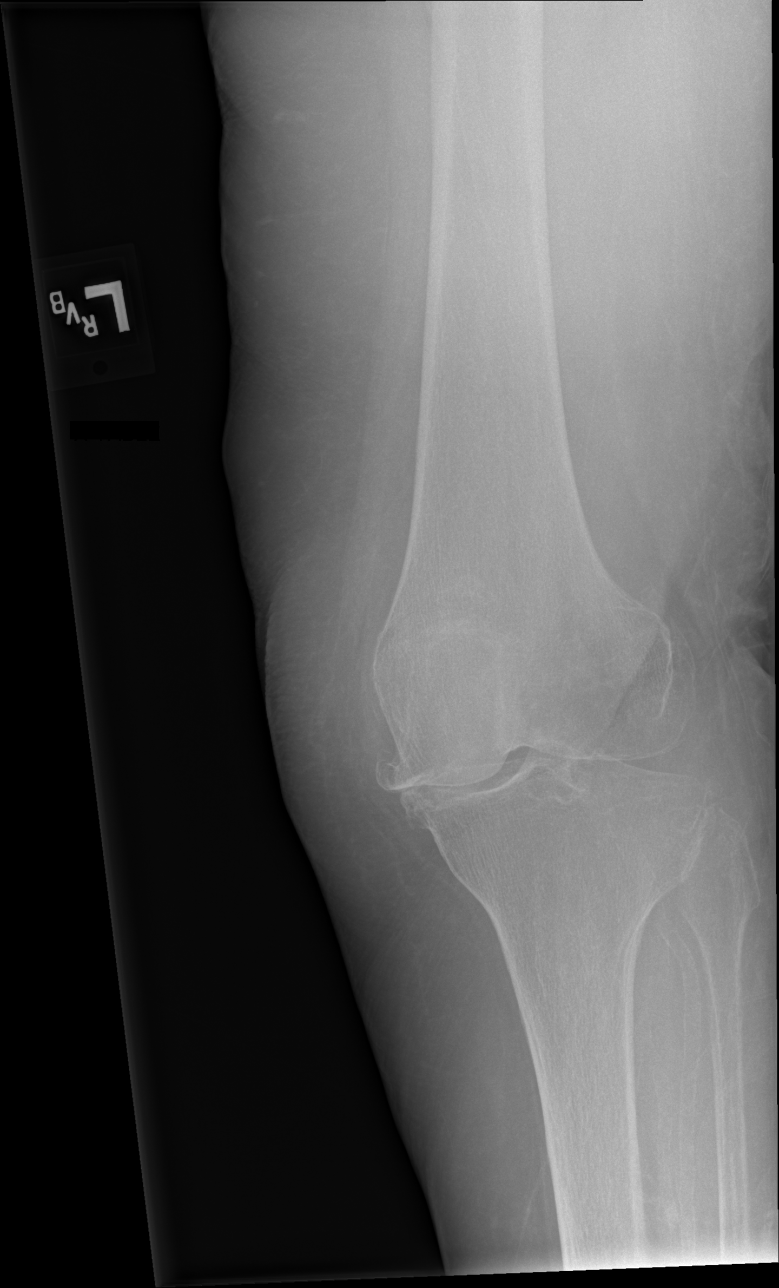

[4 of 4 positions shown; findings below may reference images not displayed]

FINDINGS: Acute, comminuted, left femoral intertrochanteric fracture. No left
hip dislocation. No acute fracture or dislocation of the left femur
distally. The left knee is visualized on the AP view and
demonstrates severe medial tibiofemoral compartment degenerative
changes with marked joint space narrowing and osteophyte marginal
formation.

There is no evidence of pelvic fracture or diastasis. No pelvic bone
lesions are seen. Visualized portions of the lower lumbar spine
demonstrates degenerative changes. Partially visualized sacrum is
grossly unremarkable with limited evaluation due to overlying bowel.

Right hip arthroplasty. The surgical hardware femoral head component
appears to be in alignment with the acetabulum. No evidence of
proximal right femoral fracture.

Vascular calcifications. Similar-appearing peripherally calcified
calcification overlying the left pelvis. Right inguinal region
surgical clips. Round calcification overlying the region of the left
gluteus soft tissues likely represents an injection granuloma.
IMPRESSION: 1. Acute comminuted left femoral intertrochanteric fracture. No left
hip dislocation.
2. Right hip arthroplasty with no radiographic evidence of surgical
hardware complication.
3. No acute displaced fracture or diastasis of the bones of the
pelvis.
4. Severe left knee degenerative changes with no findings to suggest
acute displaced fracture or dislocation of the bones of the distal
left femur. Please note limited evaluation of the left knee with
only a single AP view.

## 2021-12-15 IMAGING — CR DG CHEST 1V PORT
1 series · 1 of 1 positions shown · non-contrast
Comparison: Chest x-ray 03/23/2018.

CLINICAL DATA: Fall

EXAM:
PORTABLE CHEST 1 VIEW

[x chest ap]
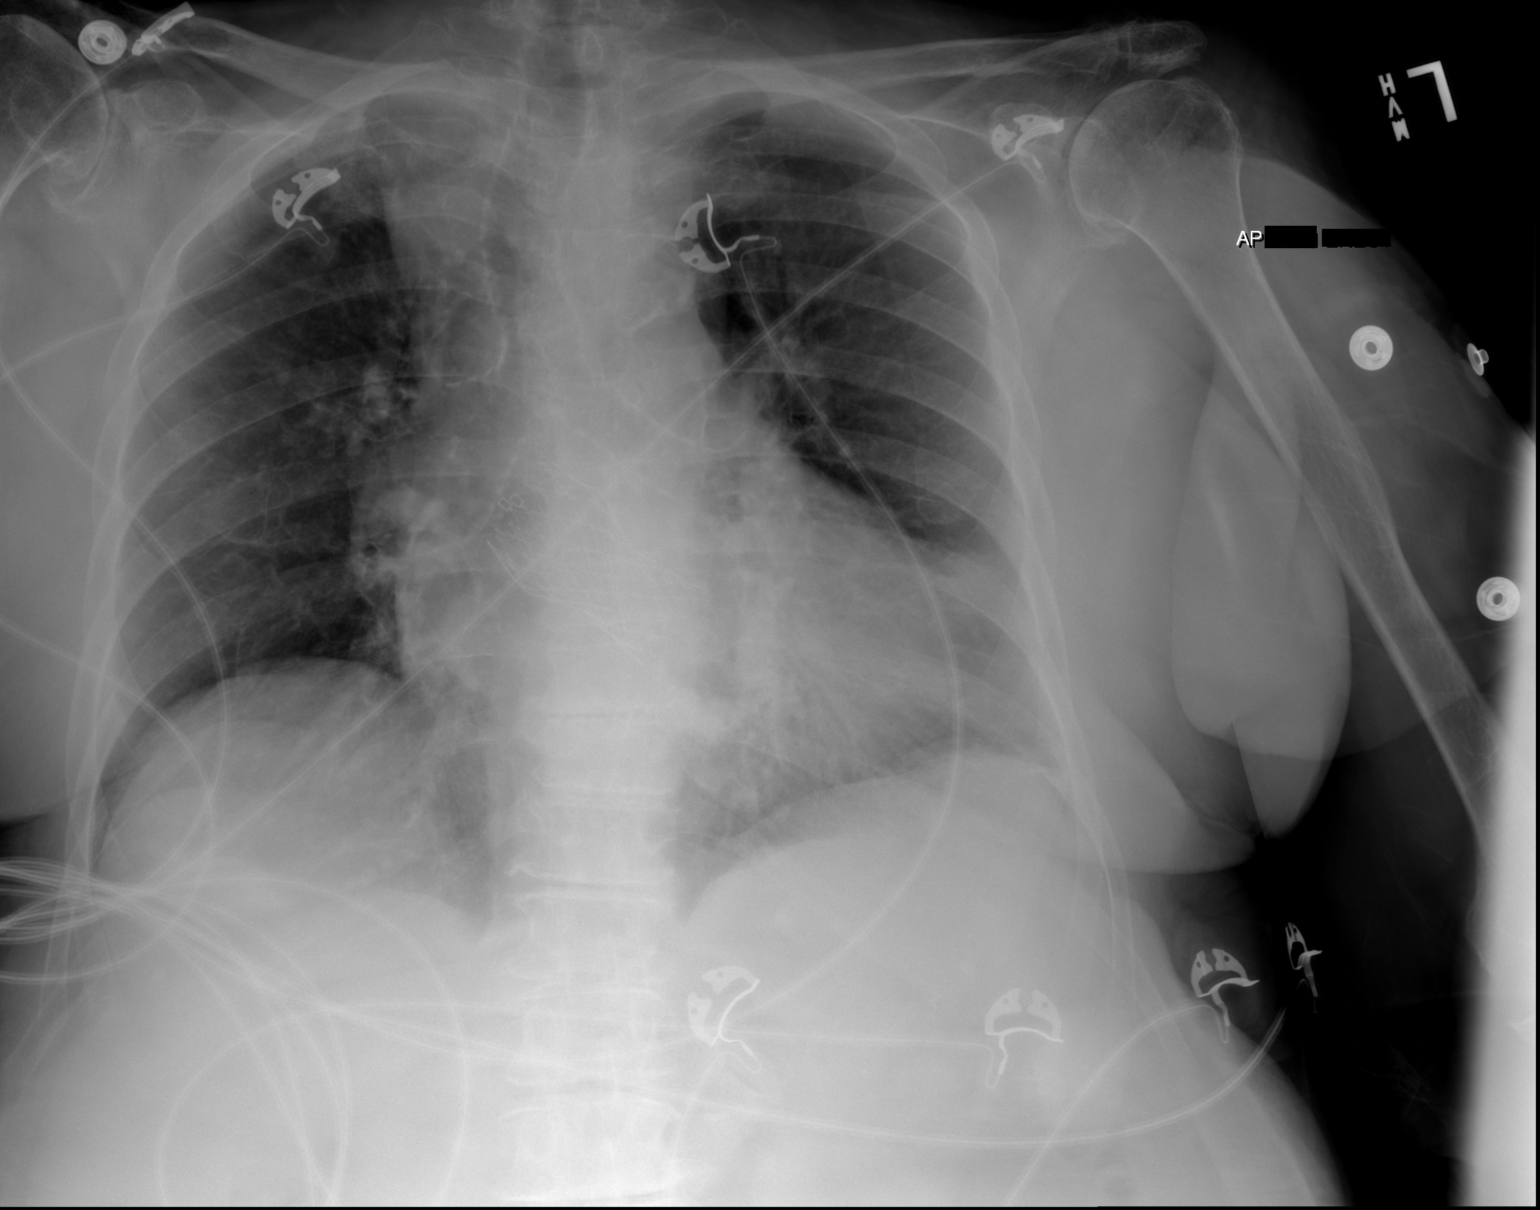

[1 of 1 positions shown; findings below may reference images not displayed]

FINDINGS: The heart size and mediastinal contours are unchanged. Aortic valve
replacement. Mitral annular calcifications again noted. Aortic arch
calcifications.

No focal consolidation. No pulmonary edema. No pleural effusion. No
pneumothorax.

Multilevel degenerative changes of the visualized spine.
Degenerative changes of bilateral shoulders. No acute displaced
fracture.
IMPRESSION: No active cardiopulmonary disease.

## 2021-12-18 NOTE — Progress Notes (Unsigned)
Sara Garza [address] [place] Phone: [phone] Subjective:   Sara Garza, am serving as a scribe for Dr. [name].  I'm seeing this patient by the request  of:  [name], NP  CC: shoulder pain follow up   TDV:[id number]  09/26/2021 PRP injection given today.  Post PRP protocol given.  The patient as well as his daughter who is primary caregiver went over with the athletic trainer.  We will follow-up again in 6 to 8 weeks  Updated 12/19/2021 Sara Garza is a 88 y.o. female coming in with complaint of shoulder pain. PRP f/u. Shoulder starting hurting 2 weeks after last visit. Also wants you to take a look a ring finger on left hand for trigger finger.       Past Medical History:  Diagnosis Date   Arthritis    CAD (coronary artery disease)    Carotid stenosis, left    50-60% stable   Diabetes mellitus    Glaucoma    Hyperlipidemia    Hypertension    Leg pain    ABIs 2/18: normal bilaterally.   Osteoporosis    Valvular heart disease    Aortic Stenosis s/p [name] 2014 // Mitral stenosis // Echo 09/2018: EF > 65, mod LVH, severe focal basal hypertrophy, RVSP 39.8, mild to mod MR, mod to severe MS (mean 9), AVR with mild AI, severe LAE (similar to prior echo)   Past Surgical History:  Procedure Laterality Date   ANOMALOUS PULMONARY VENOUS RETURN REPAIR, TOTAL     APPENDECTOMY     CARDIAC VALVE REPLACEMENT  04/29/2012   Cataracts Bilateral    CESAREAN SECTION     FOOT SURGERY     [name]   HIP ARTHROPLASTY Right 07/31/2013   Procedure: ARTHROPLASTY BIPOLAR HIP;  Surgeon: [name], MD;  Location: [facility];  Service: Orthopedics;  Laterality: Right;   INTRAMEDULLARY (IM) NAIL INTERTROCHANTERIC Left 01/01/2020   Procedure: INTRAMEDULLARY (IM) NAIL INTERTROCHANTRIC;  Surgeon: Rod Can, MD;  Location: [facility];  Service: Orthopedics;  Laterality: Left;   PERCUTANEOUS CORONARY STENT INTERVENTION (PCI-S) N/A  01/02/2012   Procedure: PERCUTANEOUS CORONARY STENT INTERVENTION (PCI-S);  Surgeon: Sherren Mocha, MD;  Location: [facility] CATH [facility];  Service: Cardiovascular;  Laterality: N/A;   Social History   Socioeconomic History   Marital status: Widowed    Spouse name: Not on file   Number of children: Not on file   Years of education: Not on file   Highest education level: Not on file  Occupational History   Not on file  Tobacco Use   Smoking status: Never    Passive exposure: Yes   Smokeless tobacco: Never  Substance and Sexual Activity   Alcohol use: Yes    Alcohol/week: 0.0 standard drinks of alcohol    Comment: very seldom   Drug use: No   Sexual activity: Not Currently  Other Topics Concern   Not on file  Social History Narrative   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      She likes to read and she takes classes at [employer]: Low Risk  (06/25/2021)   Overall Financial Resource Strain (CARDIA)    Difficulty of Paying Living Expenses: Not hard at all  Food Insecurity: No Food Insecurity (04/23/2021)   Hunger Vital Sign  Worried About Charity fundraiser in the Last Year: Never true    [place] in the Last Year: Never true  Transportation Needs: No Transportation Needs (06/25/2021)   PRAPARE - Hydrologist (Medical): No    Lack of Transportation (Non-Medical): No  Physical Activity: Inactive (04/23/2021)   Exercise Vital Sign    Days of Exercise per Week: 0 days    Minutes of Exercise per Session: 0 min  Stress: No Stress Concern Present (06/25/2021)   [place]    Feeling of Stress : Not at all  Social Connections: Moderately Integrated (04/23/2021)   Social Connection and Isolation Panel [NHANES]    Frequency of Communication with Friends and Family: More than three times a week     Frequency of Social Gatherings with Friends and Family: More than three times a week    Attends Religious Services: More than 4 times per year    Active Member of [employer] or Organizations: Yes    Attends Archivist Meetings: More than 4 times per year    Marital Status: Widowed   Allergies  Allergen Reactions   Statins Other (See Comments)    Muscle aches   Gabapentin Other (See Comments)    SOMNOLENCE   Family History  Problem Relation Age of Onset   COPD Father    Cancer Father        lung cancer   Lung cancer Other      Current Outpatient Medications (Cardiovascular):    amLODipine (NORVASC) 5 MG tablet, TAKE 1 TABLET(5 MG) BY MOUTH DAILY   carvedilol (COREG) 3.125 MG tablet, TAKE 1 TABLET(3.125 MG) BY MOUTH TWICE DAILY   ezetimibe (ZETIA) 10 MG tablet, TAKE 1 TABLET BY MOUTH DAILY   furosemide (LASIX) 20 MG tablet, TAKE 1 TABLET(20 MG) BY MOUTH DAILY   Current Outpatient Medications (Analgesics):    acetaminophen (TYLENOL) 500 MG tablet, Take 2 tablets (1,000 mg total) by mouth 4 (four) times daily.   aspirin EC 81 MG tablet, Take 81 mg by mouth daily. Swallow whole.   Current Outpatient Medications (Other):    bimatoprost (LUMIGAN) 0.01 % SOLN, Place 1 drop into both eyes at bedtime.   Calcium Carb-Cholecalciferol 500-400 MG-UNIT TABS, Take 1 tablet by mouth daily.   COVID-19 mRNA bivalent vaccine, Pfizer, (PFIZER COVID-19 VAC BIVALENT) injection, Inject into the muscle.   doxycycline (VIBRAMYCIN) 100 MG capsule, Take 1 capsule (100 mg total) by mouth 2 (two) times daily.   Misc Natural Products (TART CHERRY ADVANCED PO), Take by mouth.   Multiple Vitamin (MULTIVITAMIN) tablet, Take 1 tablet by mouth daily.   Multiple Vitamins-Minerals (PRESERVISION AREDS 2+MULTI VIT PO), Take 2 capsules by mouth in the morning and at bedtime.   Reviewed prior external information including notes and imaging from  primary care provider As well as notes that were available  from care everywhere and other healthcare systems.  Past medical history, social, surgical and family history all reviewed in electronic medical record.  No pertanent information unless stated regarding to the chief complaint.   Review of Systems:  No headache, visual changes, nausea, vomiting, diarrhea, constipation, dizziness, abdominal pain, skin rash, fevers, chills, night sweats, weight loss, swollen lymph nodes, body aches, joint swelling, chest pain, shortness of breath, mood changes. POSITIVE muscle aches  Objective  Blood pressure 128/76, pulse 77, height '4\' 11"'$  (1.499 m), SpO2 93 %.   General: No  apparent distress alert and oriented x3 mood and affect normal, dressed appropriately.  HEENT: Pupils equal, extraocular movements intact  Patient ambulates with the aid of a walker.  Patient's right shoulder does have significant swelling noted.  Patient has significant weakness and limited range of motion secondary to the arthritic changes. Left hand exam does show the ring finger at the A2 pulley does have a nodule noted.  Nontender to palpation in this area.  Procedure: Real-time Ultrasound Guided Injection of right glenohumeral joint Device: GE Logiq Q7  Ultrasound guided injection is preferred based studies that show increased duration, increased effect, greater accuracy, decreased procedural pain, increased response rate with ultrasound guided versus blind injection.  Verbal informed consent obtained.  Time-out conducted.  Noted no overlying erythema, induration, or other signs of local infection.  Skin prepped in a sterile fashion.  Local anesthesia: Topical Ethyl chloride.  With sterile technique and under real time ultrasound guidance:  Joint visualized.  23g 1  inch needle inserted anterior approach. Pictures taken for needle placement. Patient did have injection of2 cc of 0.5% Marcaine, and aspirated 95 cc of blood-tinged fluid then injected 1.0 cc of Kenalog 40  mg/dL. Completed without difficulty  Pain immediately improved suggesting accurate placement of the medication.  Advised to call if fevers/chills, erythema, induration, drainage, or persistent bleeding.  Impression: Technically successful ultrasound guided injection.  Procedure: Real-time Ultrasound Guided Injection of left trigger finger fourth flexor tendon Device: GE Logiq Q7 Ultrasound guided injection is preferred based studies that show increased duration, increased effect, greater accuracy, decreased procedural pain, increased response rate, and decreased cost with ultrasound guided versus blind injection.  Verbal informed consent obtained.  Time-out conducted.  Noted no overlying erythema, induration, or other signs of local infection.  Skin prepped in a sterile fashion.  Local anesthesia: Topical Ethyl chloride.  With sterile technique and under real time ultrasound guidance: With a 25-gauge half inch needle injecting 0.5 cc of 0.5% Marcaine and 0.5 cc of Kenalog 40 mg/mL into the tendon sheath. Completed without difficulty  Pain immediately resolved suggesting accurate placement of the medication.  Advised to call if fevers/chills, erythema, induration, drainage, or persistent bleeding.  Impression: Technically successful ultrasound guided injection.    Impression and Recommendations:     The above documentation has been reviewed and is accurate and complete Lyndal Pulley, DO

## 2021-12-19 ENCOUNTER — Ambulatory Visit (INDEPENDENT_AMBULATORY_CARE_PROVIDER_SITE_OTHER): Payer: Medicare Other | Admitting: Family Medicine

## 2021-12-19 ENCOUNTER — Encounter: Payer: Self-pay | Admitting: Family Medicine

## 2021-12-19 ENCOUNTER — Ambulatory Visit: Payer: Medicare Other | Admitting: Family Medicine

## 2021-12-19 ENCOUNTER — Ambulatory Visit: Payer: Self-pay

## 2021-12-19 VITALS — BP 128/76 | HR 77 | Ht 59.0 in

## 2021-12-19 DIAGNOSIS — M25511 Pain in right shoulder: Secondary | ICD-10-CM

## 2021-12-19 DIAGNOSIS — M75101 Unspecified rotator cuff tear or rupture of right shoulder, not specified as traumatic: Secondary | ICD-10-CM | POA: Diagnosis not present

## 2021-12-19 DIAGNOSIS — M12811 Other specific arthropathies, not elsewhere classified, right shoulder: Secondary | ICD-10-CM

## 2021-12-19 DIAGNOSIS — M65342 Trigger finger, left ring finger: Secondary | ICD-10-CM

## 2021-12-19 DIAGNOSIS — M6530 Trigger finger, unspecified finger: Secondary | ICD-10-CM | POA: Insufficient documentation

## 2021-12-19 NOTE — Assessment & Plan Note (Signed)
Aspiration done December 19, 2021.  Worsening pain of a chronic problem.  Patient is 57 and is not a surgical candidate.  I repeated aspiration at this point is going to be 1 thing patient has available to her.  Can repeat every 4 weeks if needed.  Follow-up again in 4 weeks.

## 2021-12-19 NOTE — Assessment & Plan Note (Signed)
New problem.  Fourth finger on the left hand.  Not responding well to the injection.  Discussed massaging the area.  Discussed can repeat every 3 months if necessary.

## 2021-12-19 NOTE — Patient Instructions (Signed)
Good to see you!  Drained right shoulder today Injection for trigger finger in left hand

## 2022-01-10 NOTE — Progress Notes (Signed)
[facility] [place] Phone: [phone] Subjective:   [name], am serving as a scribe for Dr. [name].  I'm seeing this patient by the request  of:  [name], NP  CC: Right shoulder pain and swelling  WER:[id number]  12/19/2021 New problem.  Fourth finger on the left hand.  Not responding well to the injection.  Discussed massaging the area.  Discussed can repeat every 3 months if necessary.  Aspiration done December 19, 2021.  Worsening pain of a chronic problem.  Patient is 14 and is not a surgical candidate.  I repeated aspiration at this point is going to be 1 thing patient has available to her.  Can repeat every 4 weeks if needed.  Follow-up again in 4 weeks.  Updated 01/16/2022 [place] Cage Grosser is a 88 y.o. female coming in with complaint of right shoulder pain. Injection after last visit lasted 2 days. Patient had some help getting into a vehicle and her shoulder  And started having increasing pain and swelling almost immediately thereafter.  Now it is waking her up at night, affecting daily activities.      Past Medical History:  Diagnosis Date   Arthritis    CAD (coronary artery disease)    Carotid stenosis, left    50-60% stable   Diabetes mellitus    Glaucoma    Hyperlipidemia    Hypertension    Leg pain    ABIs 2/18: normal bilaterally.   Osteoporosis    Valvular heart disease    Aortic Stenosis s/p [name] 2014 // Mitral stenosis // Echo 09/2018: EF > 65, mod LVH, severe focal basal hypertrophy, RVSP 39.8, mild to mod MR, mod to severe MS (mean 9), AVR with mild AI, severe LAE (similar to prior echo)   Past Surgical History:  Procedure Laterality Date   ANOMALOUS PULMONARY VENOUS RETURN REPAIR, TOTAL     APPENDECTOMY     CARDIAC VALVE REPLACEMENT  04/29/2012   Cataracts Bilateral    CESAREAN SECTION     FOOT SURGERY     [name]   HIP ARTHROPLASTY Right 07/31/2013   Procedure:  ARTHROPLASTY BIPOLAR HIP;  Surgeon: [name], MD;  Location: [facility];  Service: Orthopedics;  Laterality: Right;   INTRAMEDULLARY (IM) NAIL INTERTROCHANTERIC Left 01/01/2020   Procedure: INTRAMEDULLARY (IM) NAIL INTERTROCHANTRIC;  Surgeon: Rod Can, MD;  Location: [facility];  Service: Orthopedics;  Laterality: Left;   PERCUTANEOUS CORONARY STENT INTERVENTION (PCI-S) N/A 01/02/2012   Procedure: PERCUTANEOUS CORONARY STENT INTERVENTION (PCI-S);  Surgeon: Sherren Mocha, MD;  Location: [facility] CATH LAB;  Service: Cardiovascular;  Laterality: N/A;   Social History   Socioeconomic History   Marital status: Widowed    Spouse name: Not on file   Number of children: Not on file   Years of education: Not on file   Highest education level: Not on file  Occupational History   Not on file  Tobacco Use   Smoking status: Never    Passive exposure: Yes   Smokeless tobacco: Never  Substance and Sexual Activity   Alcohol use: Yes    Alcohol/week: 0.0 standard drinks of alcohol    Comment: very seldom   Drug use: No   Sexual activity: Not Currently  Other Topics Concern   Not on file  Social History Narrative   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  She likes to read and she takes classes at [employer] Strain: Low Risk  (06/25/2021)   Overall Financial Resource Strain (CARDIA)    Difficulty of Paying Living Expenses: Not hard at all  Food Insecurity: No Food Insecurity (04/23/2021)   Hunger Vital Sign    Worried About Running Out of Food in the Last Year: Never true    Ran Out of Food in the Last Year: Never true  Transportation Needs: No Transportation Needs (06/25/2021)   PRAPARE - Hydrologist (Medical): No    Lack of Transportation (Non-Medical): No  Physical Activity: Inactive (04/23/2021)   Exercise Vital Sign    Days of Exercise per Week: 0 days    Minutes of  Exercise per Session: 0 min  Stress: No Stress Concern Present (06/25/2021)   [place]    Feeling of Stress : Not at all  Social Connections: Moderately Integrated (04/23/2021)   Social Connection and Isolation Panel [NHANES]    Frequency of Communication with Friends and Family: More than three times a week    Frequency of Social Gatherings with Friends and Family: More than three times a week    Attends Religious Services: More than 4 times per year    Active Member of [employer] or Organizations: Yes    Attends Archivist Meetings: More than 4 times per year    Marital Status: Widowed   Allergies  Allergen Reactions   Statins Other (See Comments)    Muscle aches   Gabapentin Other (See Comments)    SOMNOLENCE   Family History  Problem Relation Age of Onset   COPD Father    Cancer Father        lung cancer   Lung cancer Other      Current Outpatient Medications (Cardiovascular):    amLODipine (NORVASC) 5 MG tablet, TAKE 1 TABLET(5 MG) BY MOUTH DAILY   carvedilol (COREG) 3.125 MG tablet, TAKE 1 TABLET(3.125 MG) BY MOUTH TWICE DAILY   ezetimibe (ZETIA) 10 MG tablet, TAKE 1 TABLET BY MOUTH DAILY   furosemide (LASIX) 20 MG tablet, TAKE 1 TABLET(20 MG) BY MOUTH DAILY   Current Outpatient Medications (Analgesics):    acetaminophen (TYLENOL) 500 MG tablet, Take 2 tablets (1,000 mg total) by mouth 4 (four) times daily.   aspirin EC 81 MG tablet, Take 81 mg by mouth daily. Swallow whole.   Current Outpatient Medications (Other):    bimatoprost (LUMIGAN) 0.01 % SOLN, Place 1 drop into both eyes at bedtime.   Calcium Carb-Cholecalciferol 500-400 MG-UNIT TABS, Take 1 tablet by mouth daily.   COVID-19 mRNA bivalent vaccine, Pfizer, (PFIZER COVID-19 VAC BIVALENT) injection, Inject into the muscle.   doxycycline (VIBRAMYCIN) 100 MG capsule, Take 1 capsule (100 mg total) by mouth 2 (two) times daily.   Misc Natural  Products (TART CHERRY ADVANCED PO), Take by mouth.   Multiple Vitamin (MULTIVITAMIN) tablet, Take 1 tablet by mouth daily.   Multiple Vitamins-Minerals (PRESERVISION AREDS 2+MULTI VIT PO), Take 2 capsules by mouth in the morning and at bedtime.   Reviewed prior external information including notes and imaging from  primary care provider As well as notes that were available from care everywhere and other healthcare systems.  Past medical history, social, surgical and family history all reviewed in electronic medical record.  No pertanent information unless stated regarding to the chief complaint.  Review of Systems:  No headache, visual changes, nausea, vomiting, diarrhea, constipation, dizziness, abdominal pain, skin rash, fevers, chills, night sweats, weight loss, swollen lymph nodes, body aches, joint swelling, chest pain, shortness of breath, mood changes. POSITIVE muscle aches, joint effusion  Objective  Blood pressure 118/66, pulse 87, height '4\' 11"'$  (1.499 m), weight 140 lb (63.5 kg), SpO2 96 %.   General: No apparent distress alert patient is accompanied with her friend HEENT: Pupils equal, extraocular movements intact  Respiratory: Patient's speak in full sentences and does not appear short of breath  Antalgic gait using the aid of a walker Right shoulder has significant swelling noted again today anteriorly and posteriorly.  Patient does have severe crepitus noted.  2 out of 5 strength of the rotator cuff noted.  Neurovascularly though intact distally.  Procedure: Real-time Ultrasound Guided Injection of right glenohumeral joint Device: GE Logiq Q7  Ultrasound guided injection is preferred based studies that show increased duration, increased effect, greater accuracy, decreased procedural pain, increased response rate with ultrasound guided versus blind injection.  Verbal informed consent obtained.  Time-out conducted.  Noted no overlying erythema, induration, or other signs of  local infection.  Skin prepped in a sterile fashion.  Local anesthesia: Topical Ethyl chloride.  With sterile technique and under real time ultrasound guidance:  Joint visualized.  23g 1  inch needle inserted anterior approach. Pictures taken for needle placement. Patient did have injection 1 cc of 0.5% Marcaine, and aspirated 70 cc of blood-tinged fluid.  Then injected 1 cc of 30 mg/mL of Toradol. Completed without difficulty  Pain immediately improved suggesting accurate placement of the medication.  Advised to call if fevers/chills, erythema, induration, drainage, or persistent bleeding.  Impression: Technically successful ultrasound guided injection.   Impression and Recommendations:    The above documentation has been reviewed and is accurate and complete Lyndal Pulley, DO

## 2022-01-16 ENCOUNTER — Encounter: Payer: Self-pay | Admitting: Family Medicine

## 2022-01-16 ENCOUNTER — Ambulatory Visit: Payer: Self-pay

## 2022-01-16 ENCOUNTER — Other Ambulatory Visit (HOSPITAL_BASED_OUTPATIENT_CLINIC_OR_DEPARTMENT_OTHER): Payer: Self-pay

## 2022-01-16 ENCOUNTER — Ambulatory Visit (INDEPENDENT_AMBULATORY_CARE_PROVIDER_SITE_OTHER): Payer: Medicare Other | Admitting: Family Medicine

## 2022-01-16 VITALS — BP 118/66 | HR 87 | Ht 59.0 in | Wt 140.0 lb

## 2022-01-16 DIAGNOSIS — M12811 Other specific arthropathies, not elsewhere classified, right shoulder: Secondary | ICD-10-CM | POA: Diagnosis not present

## 2022-01-16 DIAGNOSIS — I25119 Atherosclerotic heart disease of native coronary artery with unspecified angina pectoris: Secondary | ICD-10-CM | POA: Diagnosis not present

## 2022-01-16 DIAGNOSIS — M75101 Unspecified rotator cuff tear or rupture of right shoulder, not specified as traumatic: Secondary | ICD-10-CM

## 2022-01-16 DIAGNOSIS — Z23 Encounter for immunization: Secondary | ICD-10-CM | POA: Diagnosis not present

## 2022-01-16 DIAGNOSIS — M25511 Pain in right shoulder: Secondary | ICD-10-CM | POA: Diagnosis not present

## 2022-01-16 DIAGNOSIS — M2550 Pain in unspecified joint: Secondary | ICD-10-CM

## 2022-01-16 MED ORDER — KETOROLAC TROMETHAMINE 30 MG/ML IJ SOLN
30.0000 mg | Freq: Once | INTRAMUSCULAR | Status: AC
  Administered 2022-01-16: 30 mg via INTRAMUSCULAR

## 2022-01-16 MED ORDER — INFLUENZA VAC A&B SA ADJ QUAD 0.5 ML IM PRSY
PREFILLED_SYRINGE | INTRAMUSCULAR | 0 refills | Status: DC
  Filled 2022-01-16: qty 0.5, 1d supply, fill #0

## 2022-01-16 NOTE — Patient Instructions (Signed)
Drained shoulder  See me the week after Thanksgiving

## 2022-01-16 NOTE — Assessment & Plan Note (Signed)
Aspiration done again today.  70 cc of blood-tinged fluid.  We will send some to the lab to further evaluate for any type of infectious etiology but I think it is highly unlikely.  Patient has no fevers, chills, and I do think that this is more just a chronic problem patient is noting continued to have difficulty with.  Anti-inflammatory Toradol used instead of steroid to avoid any type of atrophy that could potentially occur.  Follow-up again in 6 weeks for further evaluation for this chronic problem with exacerbation.  Social determinants of health includes patient's underlying age as well as dementia that makes patient not a surgical candidate.

## 2022-01-17 LAB — SYNOVIAL FLUID ANALYSIS, COMPLETE
Basophils, %: 0 %
Eosinophils-Synovial: 0 % (ref 0–2)
Lymphocytes-Synovial Fld: 28 % (ref 0–74)
Monocyte/Macrophage: 51 % (ref 0–69)
Neutrophil, Synovial: 21 % (ref 0–24)
Synoviocytes, %: 0 % (ref 0–15)
WBC, Synovial: 538 cells/uL — ABNORMAL HIGH (ref <150)

## 2022-01-28 DIAGNOSIS — H04123 Dry eye syndrome of bilateral lacrimal glands: Secondary | ICD-10-CM | POA: Diagnosis not present

## 2022-01-28 DIAGNOSIS — H1789 Other corneal scars and opacities: Secondary | ICD-10-CM | POA: Diagnosis not present

## 2022-01-28 DIAGNOSIS — H353132 Nonexudative age-related macular degeneration, bilateral, intermediate dry stage: Secondary | ICD-10-CM | POA: Diagnosis not present

## 2022-01-28 DIAGNOSIS — H524 Presbyopia: Secondary | ICD-10-CM | POA: Diagnosis not present

## 2022-01-28 DIAGNOSIS — H401131 Primary open-angle glaucoma, bilateral, mild stage: Secondary | ICD-10-CM | POA: Diagnosis not present

## 2022-02-15 ENCOUNTER — Other Ambulatory Visit (HOSPITAL_BASED_OUTPATIENT_CLINIC_OR_DEPARTMENT_OTHER): Payer: Self-pay

## 2022-02-15 DIAGNOSIS — Z23 Encounter for immunization: Secondary | ICD-10-CM | POA: Diagnosis not present

## 2022-02-15 MED ORDER — COMIRNATY 30 MCG/0.3ML IM SUSY
PREFILLED_SYRINGE | INTRAMUSCULAR | 0 refills | Status: DC
  Filled 2022-02-15: qty 0.3, 1d supply, fill #0

## 2022-02-19 ENCOUNTER — Telehealth: Payer: Self-pay | Admitting: Adult Health

## 2022-02-19 ENCOUNTER — Encounter: Payer: Self-pay | Admitting: Adult Health

## 2022-02-19 ENCOUNTER — Ambulatory Visit (INDEPENDENT_AMBULATORY_CARE_PROVIDER_SITE_OTHER): Payer: Medicare Other | Admitting: Adult Health

## 2022-02-19 VITALS — BP 130/80 | HR 95 | Temp 97.8°F | Ht 59.0 in | Wt 139.0 lb

## 2022-02-19 DIAGNOSIS — E785 Hyperlipidemia, unspecified: Secondary | ICD-10-CM | POA: Diagnosis not present

## 2022-02-19 DIAGNOSIS — Z952 Presence of prosthetic heart valve: Secondary | ICD-10-CM | POA: Diagnosis not present

## 2022-02-19 DIAGNOSIS — I2510 Atherosclerotic heart disease of native coronary artery without angina pectoris: Secondary | ICD-10-CM | POA: Diagnosis not present

## 2022-02-19 DIAGNOSIS — M609 Myositis, unspecified: Secondary | ICD-10-CM | POA: Diagnosis not present

## 2022-02-19 DIAGNOSIS — E538 Deficiency of other specified B group vitamins: Secondary | ICD-10-CM | POA: Diagnosis not present

## 2022-02-19 DIAGNOSIS — E559 Vitamin D deficiency, unspecified: Secondary | ICD-10-CM | POA: Diagnosis not present

## 2022-02-19 DIAGNOSIS — T466X5A Adverse effect of antihyperlipidemic and antiarteriosclerotic drugs, initial encounter: Secondary | ICD-10-CM | POA: Diagnosis not present

## 2022-02-19 DIAGNOSIS — I10 Essential (primary) hypertension: Secondary | ICD-10-CM | POA: Diagnosis not present

## 2022-02-19 DIAGNOSIS — N184 Chronic kidney disease, stage 4 (severe): Secondary | ICD-10-CM | POA: Diagnosis not present

## 2022-02-19 DIAGNOSIS — I25119 Atherosclerotic heart disease of native coronary artery with unspecified angina pectoris: Secondary | ICD-10-CM

## 2022-02-19 DIAGNOSIS — E1121 Type 2 diabetes mellitus with diabetic nephropathy: Secondary | ICD-10-CM

## 2022-02-19 DIAGNOSIS — M159 Polyosteoarthritis, unspecified: Secondary | ICD-10-CM | POA: Diagnosis not present

## 2022-02-19 DIAGNOSIS — I5032 Chronic diastolic (congestive) heart failure: Secondary | ICD-10-CM

## 2022-02-19 LAB — LIPID PANEL
Cholesterol: 209 mg/dL — ABNORMAL HIGH (ref 0–200)
HDL: 44.9 mg/dL (ref >39.00)
NonHDL: 164.12
Total CHOL/HDL Ratio: 5
Triglycerides: 220 mg/dL — ABNORMAL HIGH (ref 0.0–149.0)
VLDL: 44 mg/dL — ABNORMAL HIGH (ref 0.0–40.0)

## 2022-02-19 LAB — CBC WITH DIFFERENTIAL/PLATELET
Basophils Absolute: 0.1 10*3/uL (ref 0.0–0.1)
Basophils Relative: 0.8 % (ref 0.0–3.0)
Eosinophils Absolute: 0.2 10*3/uL (ref 0.0–0.7)
Eosinophils Relative: 3.1 % (ref 0.0–5.0)
HCT: 37 % (ref 36.0–46.0)
Hemoglobin: 12.4 g/dL (ref 12.0–15.0)
Lymphocytes Relative: 15.5 % (ref 12.0–46.0)
Lymphs Abs: 1 10*3/uL (ref 0.7–4.0)
MCHC: 33.4 g/dL (ref 30.0–36.0)
MCV: 94.4 fl (ref 78.0–100.0)
Monocytes Absolute: 0.6 10*3/uL (ref 0.1–1.0)
Monocytes Relative: 9.1 % (ref 3.0–12.0)
Neutro Abs: 4.9 10*3/uL (ref 1.4–7.7)
Neutrophils Relative %: 71.5 % (ref 43.0–77.0)
Platelets: 223 10*3/uL (ref 150.0–400.0)
RBC: 3.93 Mil/uL (ref 3.87–5.11)
RDW: 14.3 % (ref 11.5–15.5)
WBC: 6.8 10*3/uL (ref 4.0–10.5)

## 2022-02-19 LAB — COMPREHENSIVE METABOLIC PANEL
ALT: 12 U/L (ref 0–35)
AST: 17 U/L (ref 0–37)
Albumin: 4 g/dL (ref 3.5–5.2)
Alkaline Phosphatase: 60 U/L (ref 39–117)
BUN: 45 mg/dL — ABNORMAL HIGH (ref 6–23)
CO2: 25 mEq/L (ref 19–32)
Calcium: 9.8 mg/dL (ref 8.4–10.5)
Chloride: 108 mEq/L (ref 96–112)
Creatinine, Ser: 1.63 mg/dL — ABNORMAL HIGH (ref 0.40–1.20)
GFR: 26.42 mL/min — ABNORMAL LOW (ref >60.00)
Glucose, Bld: 86 mg/dL (ref 70–99)
Potassium: 4.6 mEq/L (ref 3.5–5.1)
Sodium: 141 mEq/L (ref 135–145)
Total Bilirubin: 0.5 mg/dL (ref 0.2–1.2)
Total Protein: 7.3 g/dL (ref 6.0–8.3)

## 2022-02-19 LAB — LDL CHOLESTEROL, DIRECT: Direct LDL: 139 mg/dL

## 2022-02-19 LAB — VITAMIN D 25 HYDROXY (VIT D DEFICIENCY, FRACTURES): VITD: 39.06 ng/mL (ref 30.00–100.00)

## 2022-02-19 LAB — HEMOGLOBIN A1C: Hgb A1c MFr Bld: 6.3 % (ref 4.6–6.5)

## 2022-02-19 LAB — TSH: TSH: 1.67 u[IU]/mL (ref 0.35–5.50)

## 2022-02-19 LAB — VITAMIN B12: Vitamin B-12: 419 pg/mL (ref 211–911)

## 2022-02-19 NOTE — Progress Notes (Signed)
Subjective:    Patient ID: Sara Garza, female    DOB: July 15, 1923, 88 y.o.   MRN: [id number]  HPI Patient presents for yearly preventative medicine examination. She is a pleasant 88 year old female who  has a past medical history of Arthritis, CAD (coronary artery disease), Carotid stenosis, left, Diabetes mellitus, Glaucoma, Hyperlipidemia, Hypertension, Leg pain, Osteoporosis, and Valvular heart disease.  CAD-managed by cardiology.  She was evaluated with pressure wire analysis of the RCA and LCx demonstrating no hemodynamic evidence of flow obstruction.  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  CHF -managed by cardiology with furosemide 20 mg daily.  She cannot tolerate any higher doses due to frequent urination and urinary incontinence.  Aortic Stenosis - h/o [name] in 2014  Hyperlipidemia - managed with zetia 10 mg due to statin intolerance  Lab Results  Component Value Date   CHOL 203 (H) 12/01/2019   HDL 42 (L) 12/01/2019   LDLCALC 133 (H) 12/01/2019   LDLDIRECT 143.0 11/26/2018   TRIG 149 12/01/2019   CHOLHDL 4.8 12/01/2019   HTN -managed with amlodipine 5 mg and carvedilol 3.125 mg BID.  She does have excellent control with these 2 medications.  Denies dizziness, lightheadedness, chest pain, or shortness of breath BP Readings from Last 3 Encounters:  01/16/22 118/66  12/19/21 128/76  11/16/21 120/60   DM Type 2 - diet controlled.  Lab Results  Component Value Date   HGBA1C 5.9 (H) 02/16/2021   Chronic Osteoarthritis - in shoulder and hips. Is being seen by sports medicine routinely for fluid drainage from her right shoulder She is walking with a walker but would like to do some home health PT for her leg and hip pain. She has pain with walking and changing positions   CKD Stage 4 - baseline CR 1.8 with GFR 26  All immunizations and health maintenance protocols were  reviewed with the patient and needed orders were placed. She is up to date on vaccinations.   Appropriate screening laboratory values were ordered for the patient including screening of hyperlipidemia, renal function and hepatic function. .  Medication reconciliation,  past medical history, social history, problem list and allergies were reviewed in detail with the patient  Goals were established with regard to weight loss, exercise, and  diet in compliance with medications  Review of Systems  Constitutional: Negative.   HENT: Negative.    Eyes: Negative.   Respiratory: Negative.    Cardiovascular:  Positive for leg swelling.  Gastrointestinal: Negative.   Endocrine: Negative.   Genitourinary: Negative.   Musculoskeletal:  Positive for arthralgias and back pain.  Skin: Negative.   Allergic/Immunologic: Negative.   Neurological: Negative.   Hematological: Negative.   Psychiatric/Behavioral: Negative.     Past Medical History:  Diagnosis Date   Arthritis    CAD (coronary artery disease)    Carotid stenosis, left    50-60% stable   Diabetes mellitus    Glaucoma    Hyperlipidemia    Hypertension    Leg pain    ABIs 2/18: normal bilaterally.   Osteoporosis    Valvular heart disease    Aortic Stenosis s/p [name] 2014 // Mitral stenosis // Echo 09/2018: EF > 65, mod LVH, severe focal basal hypertrophy, RVSP 39.8, mild to mod MR, mod to severe MS (mean 9), AVR with mild AI, severe LAE (similar to prior echo)  Social History   Socioeconomic History   Marital status: Widowed    Spouse name: Not on file   Number of children: Not on file   Years of education: Not on file   Highest education level: Not on file  Occupational History   Not on file  Tobacco Use   Smoking status: Never    Passive exposure: Yes   Smokeless tobacco: Never  Substance and Sexual Activity   Alcohol use: Yes    Alcohol/week: 0.0 standard drinks of alcohol    Comment: very seldom   Drug use: No    Sexual activity: Not Currently  Other Topics Concern   Not on file  Social History Narrative   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      She likes to read and she takes classes at [employer]: Low Risk  (06/25/2021)   Overall Financial Resource Strain (CARDIA)    Difficulty of Paying Living Expenses: Not hard at all  Food Insecurity: No Food Insecurity (04/23/2021)   Hunger Vital Sign    Worried About Running Out of Food in the Last Year: Never true    [place] in the Last Year: Never true  Transportation Needs: No Transportation Needs (06/25/2021)   PRAPARE - Hydrologist (Medical): No    Lack of Transportation (Non-Medical): No  Physical Activity: Inactive (04/23/2021)   Exercise Vital Sign    Days of Exercise per Week: 0 days    Minutes of Exercise per Session: 0 min  Stress: No Stress Concern Present (06/25/2021)   [place]    Feeling of Stress : Not at all  Social Connections: Moderately Integrated (04/23/2021)   Social Connection and Isolation Panel [NHANES]    Frequency of Communication with Friends and Family: More than three times a week    Frequency of Social Gatherings with Friends and Family: More than three times a week    Attends Religious Services: More than 4 times per year    Active Member of [employer] or Organizations: Yes    Attends Archivist Meetings: More than 4 times per year    Marital Status: Widowed  Intimate Partner Violence: Not At Risk (04/23/2021)   Humiliation, Afraid, Rape, and Kick questionnaire    Fear of Current or Ex-Partner: No    Emotionally Abused: No    Physically Abused: No    Sexually Abused: No    Past Surgical History:  Procedure Laterality Date   ANOMALOUS PULMONARY VENOUS RETURN REPAIR, TOTAL     APPENDECTOMY     CARDIAC VALVE  REPLACEMENT  04/29/2012   Cataracts Bilateral    CESAREAN SECTION     FOOT SURGERY     [name]   HIP ARTHROPLASTY Right 07/31/2013   Procedure: ARTHROPLASTY BIPOLAR HIP;  Surgeon: [name], MD;  Location: [facility];  Service: Orthopedics;  Laterality: Right;   INTRAMEDULLARY (IM) NAIL INTERTROCHANTERIC Left 01/01/2020   Procedure: INTRAMEDULLARY (IM) NAIL INTERTROCHANTRIC;  Surgeon: Rod Can, MD;  Location: [facility];  Service: Orthopedics;  Laterality: Left;   PERCUTANEOUS CORONARY STENT INTERVENTION (PCI-S) N/A 01/02/2012   Procedure: PERCUTANEOUS CORONARY STENT INTERVENTION (PCI-S);  Surgeon: Sherren Mocha, MD;  Location[facility] CATH LAB;  Service: Cardiovascular;  Laterality: N/A;    Family History  Problem Relation  Age of Onset   COPD Father    Cancer Father        lung cancer   Lung cancer Other     Allergies  Allergen Reactions   Statins Other (See Comments)    Muscle aches   Gabapentin Other (See Comments)    SOMNOLENCE    Current Outpatient Medications on File Prior to Visit  Medication Sig Dispense Refill   acetaminophen (TYLENOL) 500 MG tablet Take 2 tablets (1,000 mg total) by mouth 4 (four) times daily. 30 tablet 0   amLODipine (NORVASC) 5 MG tablet TAKE 1 TABLET(5 MG) BY MOUTH DAILY 90 tablet 2   aspirin EC 81 MG tablet Take 81 mg by mouth daily. Swallow whole.     bimatoprost (LUMIGAN) 0.01 % SOLN Place 1 drop into both eyes at bedtime.     Calcium Carb-Cholecalciferol 500-400 MG-UNIT TABS Take 1 tablet by mouth daily.     carvedilol (COREG) 3.125 MG tablet TAKE 1 TABLET(3.125 MG) BY MOUTH TWICE DAILY 180 tablet 2   COVID-19 mRNA bivalent vaccine, Pfizer, (PFIZER COVID-19 VAC BIVALENT) injection Inject into the muscle. 0.3 mL 0   COVID-19 mRNA vaccine 2023-2024 (COMIRNATY) syringe Inject into the muscle. 0.3 mL 0   doxycycline (VIBRAMYCIN) 100 MG capsule Take 1 capsule (100 mg total) by mouth 2 (two) times daily. 20 capsule 0   ezetimibe (ZETIA) 10 MG tablet  TAKE 1 TABLET BY MOUTH DAILY 90 tablet 1   furosemide (LASIX) 20 MG tablet TAKE 1 TABLET(20 MG) BY MOUTH DAILY 90 tablet 1   influenza vaccine adjuvanted (FLUAD) 0.5 ML injection Inject into the muscle. 0.5 mL 0   Misc Natural Products (TART CHERRY ADVANCED PO) Take by mouth.     Multiple Vitamin (MULTIVITAMIN) tablet Take 1 tablet by mouth daily.     Multiple Vitamins-Minerals (PRESERVISION AREDS 2+MULTI VIT PO) Take 2 capsules by mouth in the morning and at bedtime.     No current facility-administered medications on file prior to visit.    There were no vitals taken for this visit.      Objective:   Physical Exam Vitals and nursing note reviewed.  Constitutional:      General: She is not in acute distress.    Appearance: Normal appearance. She is well-developed. She is not ill-appearing.  HENT:     Head: Normocephalic and atraumatic.     Right Ear: Tympanic membrane, ear canal and external ear normal. There is no impacted cerumen.     Left Ear: Tympanic membrane, ear canal and external ear normal. There is no impacted cerumen.     Nose: Nose normal. No congestion or rhinorrhea.     Mouth/Throat:     Mouth: Mucous membranes are moist.     Pharynx: Oropharynx is clear. No oropharyngeal exudate or posterior oropharyngeal erythema.  Eyes:     General:        Right eye: No discharge.        Left eye: No discharge.     Extraocular Movements: Extraocular movements intact.     Conjunctiva/sclera: Conjunctivae normal.     Pupils: Pupils are equal, round, and reactive to light.  Neck:     Thyroid: No thyromegaly.     Vascular: No carotid bruit.     Trachea: No tracheal deviation.  Cardiovascular:     Rate and Rhythm: Normal rate and regular rhythm.     Pulses: Normal pulses.     Heart sounds: Normal heart sounds. No murmur  heard.    No friction rub. No gallop.  Pulmonary:     Effort: Pulmonary effort is normal. No respiratory distress.     Breath sounds: Normal breath sounds.  No stridor. No wheezing, rhonchi or rales.  Chest:     Chest wall: No tenderness.  Abdominal:     General: Abdomen is flat. Bowel sounds are normal. There is no distension.     Palpations: Abdomen is soft. There is no mass.     Tenderness: There is no abdominal tenderness. There is no right CVA tenderness, left CVA tenderness, guarding or rebound.     Hernia: No hernia is present.  Musculoskeletal:        General: No swelling, tenderness, deformity or signs of injury. Normal range of motion.     Cervical back: Normal range of motion and neck supple.     Right lower leg: Edema present.     Left lower leg: Edema present.  Lymphadenopathy:     Cervical: No cervical adenopathy.  Skin:    General: Skin is warm and dry.     Coloration: Skin is not jaundiced or pale.     Findings: No bruising, erythema, lesion or rash.  Neurological:     General: No focal deficit present.     Mental Status: She is alert and oriented to person, place, and time.     Cranial Nerves: No cranial nerve deficit.     Sensory: No sensory deficit.     Motor: Weakness present.     Coordination: Coordination normal.     Gait: Gait abnormal (slow steady gait with walker).     Deep Tendon Reflexes: Reflexes normal.  Psychiatric:        Mood and Affect: Mood normal.        Behavior: Behavior normal.        Thought Content: Thought content normal.        Judgment: Judgment normal.       Assessment & Plan:  1. Essential hypertension - Controlled. No change in medication  - CBC with Differential/Platelet; Future - Comprehensive metabolic panel; Future - Hemoglobin A1c; Future - Lipid panel; Future - TSH; Future - Microalbumin/Creatinine Ratio, Urine; Future  2. Chronic diastolic CHF (congestive heart failure) (HCC) - Continue lasix 20 mg daily  - CBC with Differential/Platelet; Future - Comprehensive metabolic panel; Future - Hemoglobin A1c; Future - Lipid panel; Future - TSH; Future  3. Dyslipidemia -  Continue Zetia  - CBC with Differential/Platelet; Future - Comprehensive metabolic panel; Future - Hemoglobin A1c; Future - Lipid panel; Future - TSH; Future  4. Coronary artery disease involving native coronary artery of native heart without angina pectoris - per cardiology  - CBC with Differential/Platelet; Future - Comprehensive metabolic panel; Future - Hemoglobin A1c; Future - Lipid panel; Future - TSH; Future  5. Controlled type 2 diabetes mellitus with diabetic nephropathy, without long-term current use of insulin ([place]) - Consider metformin ut unlikely she will need it  - CBC with Differential/Platelet; Future - Comprehensive metabolic panel; Future - Hemoglobin A1c; Future - Lipid panel; Future - TSH; Future - Microalbumin/Creatinine Ratio, Urine; Future  6. Vitamin D deficiency  - VITAMIN D 25 Hydroxy (Vit-D Deficiency, Fractures); Future  7. Vitamin B12 deficiency  - Vitamin B12; Future  8. Chronic kidney disease, stage 4 (severe) (HCC) - Continue to monitor  - Avoid nephrotoxic agents  - CBC with Differential/Platelet; Future - Comprehensive metabolic panel; Future - Hemoglobin A1c; Future - Lipid panel; Future -  TSH; Future  9. S/P [name] (transcatheter aortic valve replacement) - per cardiology  - CBC with Differential/Platelet; Future - Comprehensive metabolic panel; Future - Hemoglobin A1c; Future - Lipid panel; Future - TSH; Future  10. Statin-induced myositis - Continue zetia   11. Primary osteoarthritis involving multiple joints  - Ambulatory referral to [place], NP

## 2022-02-19 NOTE — Telephone Encounter (Signed)
Pt called, stating she has a missed call from NP.  Pt asked that NP call back at his earliest convenience.

## 2022-02-19 NOTE — Patient Instructions (Addendum)
It was great seeing you today   We will follow up with you regarding your lab work   Please let me know if you need anything   Someone from home health will call you to schedule your physical therapy

## 2022-02-19 NOTE — Telephone Encounter (Signed)
Please advise 

## 2022-02-20 ENCOUNTER — Telehealth: Payer: Self-pay | Admitting: Adult Health

## 2022-02-20 DIAGNOSIS — M159 Polyosteoarthritis, unspecified: Secondary | ICD-10-CM

## 2022-02-20 DIAGNOSIS — R2681 Unsteadiness on feet: Secondary | ICD-10-CM

## 2022-02-20 LAB — MICROALBUMIN / CREATININE URINE RATIO
Creatinine,U: 66.7 mg/dL
Microalb Creat Ratio: 1.1 mg/g (ref 0.0–30.0)
Microalb, Ur: <0.7 mg/dL (ref 0.0–1.9)

## 2022-02-20 NOTE — Telephone Encounter (Signed)
Noted  

## 2022-02-20 NOTE — Telephone Encounter (Signed)
Pt would like blood work results °

## 2022-02-20 NOTE — Progress Notes (Unsigned)
[place] Phone: [phone] Subjective:   [name], am serving as a scribe for Dr. [name].  I'm seeing this patient by the request  of:  [name], NP  CC: Right shoulder pain follow-up  [id number]  01/16/2022 Aspiration done again today.  70 cc of blood-tinged fluid.  We will send some to the lab to further evaluate for any type of infectious etiology but I think it is highly unlikely.  Patient has no fevers, chills, and I do think that this is more just a chronic problem patient is noting continued to have difficulty with.  Anti-inflammatory Toradol used instead of steroid to avoid any type of atrophy that could potentially occur.  Follow-up again in 6 weeks for further evaluation for this chronic problem with exacerbation.  Social determinants of health includes patient's underlying age as well as dementia that makes patient not a surgical candidate.   Update 02/27/2022 Sara Garza Curt is a 88 y.o. female coming in with complaint of R shoulder pain.  Known severe arthritis with effusion.  Patient states that her pain has increased since last visit. Pain is constant.       Past Medical History:  Diagnosis Date   Arthritis    CAD (coronary artery disease)    Carotid stenosis, left    50-60% stable   Diabetes mellitus    Glaucoma    Hyperlipidemia    Hypertension    Leg pain    ABIs 2/18: normal bilaterally.   Osteoporosis    Valvular heart disease    Aortic Stenosis s/p [name] 2014 // Mitral stenosis // Echo 09/2018: EF > 65, mod LVH, severe focal basal hypertrophy, RVSP 39.8, mild to mod MR, mod to severe MS (mean 9), AVR with mild AI, severe LAE (similar to prior echo)   Past Surgical History:  Procedure Laterality Date   ANOMALOUS PULMONARY VENOUS RETURN REPAIR, TOTAL     APPENDECTOMY     CARDIAC VALVE REPLACEMENT  04/29/2012   Cataracts Bilateral    CESAREAN SECTION     FOOT  SURGERY     [name]   HIP ARTHROPLASTY Right 07/31/2013   Procedure: ARTHROPLASTY BIPOLAR HIP;  Surgeon: [name], MD;  Location: [facility];  Service: Orthopedics;  Laterality: Right;   INTRAMEDULLARY (IM) NAIL INTERTROCHANTERIC Left 01/01/2020   Procedure: INTRAMEDULLARY (IM) NAIL INTERTROCHANTRIC;  Surgeon: Rod Can, MD;  Location: [facility];  Service: Orthopedics;  Laterality: Left;   PERCUTANEOUS CORONARY STENT INTERVENTION (PCI-S) N/A 01/02/2012   Procedure: PERCUTANEOUS CORONARY STENT INTERVENTION (PCI-S);  Surgeon: Sherren Mocha, MD;  Location: [facility] C[facility] LAB;  Service: Cardiovascular;  Laterality: N/A;   Social History   Socioeconomic History   Marital status: Widowed    Spouse name: Not on file   Number of children: Not on file   Years of education: Not on file   Highest education level: Not on file  Occupational History   Not on file  Tobacco Use   Smoking status: Never    Passive exposure: Yes   Smokeless tobacco: Never  Substance and Sexual Activity   Alcohol use: Yes    Alcohol/week: 0.0 standard drinks of alcohol    Comment: very seldom   Drug use: No   Sexual activity: Not Currently  Other Topics Concern   Not on file  Social History Narrative   She worked as a Insurance account manager for 10 years  Has one daughter      She likes to read and she takes classes at [employer] Strain: Low Risk  (06/25/2021)   Overall Financial Resource Strain (CARDIA)    Difficulty of Paying Living Expenses: Not hard at all  Food Insecurity: No Food Insecurity (04/23/2021)   Hunger Vital Sign    Worried About Running Out of Food in the Last Year: Never true    Ran Out of Food in the Last Year: Never true  Transportation Needs: No Transportation Needs (06/25/2021)   PRAPARE - Hydrologist (Medical): No    Lack of Transportation (Non-Medical): No  Physical Activity: Inactive (04/23/2021)    Exercise Vital Sign    Days of Exercise per Week: 0 days    Minutes of Exercise per Session: 0 min  Stress: No Stress Concern Present (06/25/2021)   [place]    Feeling of Stress : Not at all  Social Connections: Moderately Integrated (04/23/2021)   Social Connection and Isolation Panel [NHANES]    Frequency of Communication with Friends and Family: More than three times a week    Frequency of Social Gatherings with Friends and Family: More than three times a week    Attends Religious Services: More than 4 times per year    Active Member of [employer] or Organizations: Yes    Attends Archivist Meetings: More than 4 times per year    Marital Status: Widowed   Allergies  Allergen Reactions   Statins Other (See Comments)    Muscle aches   Gabapentin Other (See Comments)    SOMNOLENCE   Family History  Problem Relation Age of Onset   COPD Father    Cancer Father        lung cancer   Lung cancer Other      Current Outpatient Medications (Cardiovascular):    amLODipine (NORVASC) 5 MG tablet, TAKE 1 TABLET(5 MG) BY MOUTH DAILY   carvedilol (COREG) 3.125 MG tablet, TAKE 1 TABLET(3.125 MG) BY MOUTH TWICE DAILY   ezetimibe (ZETIA) 10 MG tablet, TAKE 1 TABLET BY MOUTH DAILY   furosemide (LASIX) 20 MG tablet, TAKE 1 TABLET(20 MG) BY MOUTH DAILY   Current Outpatient Medications (Analgesics):    acetaminophen (TYLENOL) 500 MG tablet, Take 2 tablets (1,000 mg total) by mouth 4 (four) times daily.   aspirin EC 81 MG tablet, Take 81 mg by mouth daily. Swallow whole.   Current Outpatient Medications (Other):    bimatoprost (LUMIGAN) 0.01 % SOLN, Place 1 drop into both eyes at bedtime.   Calcium Carb-Cholecalciferol 500-400 MG-UNIT TABS, Take 1 tablet by mouth daily.   COVID-19 mRNA bivalent vaccine, Pfizer, (PFIZER COVID-19 VAC BIVALENT) injection, Inject into the muscle.   COVID-19 mRNA vaccine 2023-2024 (COMIRNATY)  syringe, Inject into the muscle.   doxycycline (VIBRAMYCIN) 100 MG capsule, Take 1 capsule (100 mg total) by mouth 2 (two) times daily.   influenza vaccine adjuvanted (FLUAD) 0.5 ML injection, Inject into the muscle.   Misc Natural Products (TART CHERRY ADVANCED PO), Take by mouth.   Multiple Vitamin (MULTIVITAMIN) tablet, Take 1 tablet by mouth daily.   Multiple Vitamins-Minerals (PRESERVISION AREDS 2+MULTI VIT PO), Take 2 capsules by mouth in the morning and at bedtime.   Reviewed prior external information including notes and imaging from  primary care provider As well as notes that were available from care  everywhere and other healthcare systems.  Past medical history, social, surgical and family history all reviewed in electronic medical record.  No pertanent information unless stated regarding to the chief complaint.   Review of Systems:  No headache, visual changes, nausea, vomiting, diarrhea, constipation, dizziness, abdominal pain, skin rash, fevers, chills, night sweats, weight loss, swollen lymph nodes, body aches, joint swelling, chest pain, shortness of breath, mood changes. POSITIVE muscle aches  Objective  There were no vitals taken for this visit.   General: No apparent distress alert and oriented x3 mood and affect normal, dressed appropriately.  HEENT: Pupils equal, extraocular movements intact  Respiratory: Patient's speak in full sentences and does not appear short of breath  Cardiovascular: No lower extremity edema, non tender, no erythema  Significant antalgic gait noted.  Right shoulder does have significant swelling noted again.  Audible popping with any range of motion.  Seems to be more crepitus.  Significant weakness noted in secondary to some voluntary guarding   Procedure: Real-time Ultrasound Guided Injection of right glenohumeral joint Device: GE Logiq Q7  Ultrasound guided injection is preferred based studies that show increased duration, increased effect,  greater accuracy, decreased procedural pain, increased response rate with ultrasound guided versus blind injection.  Verbal informed consent obtained.  Time-out conducted.  Noted no overlying erythema, induration, or other signs of local infection.  Skin prepped in a sterile fashion.  Local anesthesia: Topical Ethyl chloride.  With sterile technique and under real time ultrasound guidance:  Joint visualized.  23g 1  inch needle inserted anterior approach. Pictures taken for needle placement. Patient did have injection of= 2 cc of 0.5% Marcaine, and aspirated 100 cc of blood-tinged fluid then injected 1.0 cc of Kenalog 40 mg/dL. Completed without difficulty  Pain immediately improved  suggesting accurate placement of the medication.  Advised to call if fevers/chills, erythema, induration, drainage, or persistent bleeding.  Impression: Technically successful ultrasound guided injection.     Impression and Recommendations:      The above documentation has been reviewed and is accurate and complete Lyndal Pulley, DO

## 2022-02-20 NOTE — Telephone Encounter (Signed)
[facility]   FYI for MD:  Pt does not want services today, but wants to be seen on Monday instead.

## 2022-02-20 NOTE — Telephone Encounter (Signed)
Updated patient on her labs.  Yesterday we have made a referral to home health, she would like to cancel that and instead go to physical therapy at [facility]

## 2022-02-26 ENCOUNTER — Telehealth: Payer: Self-pay | Admitting: Adult Health

## 2022-02-26 NOTE — Telephone Encounter (Signed)
Pt states she already spoke to [facility] and she cannot figure out why she was sent  a bill for 08/29/21.  Pt is asking for CMA to call her back to try to figure this out.

## 2022-02-27 ENCOUNTER — Ambulatory Visit: Payer: Self-pay

## 2022-02-27 ENCOUNTER — Ambulatory Visit (INDEPENDENT_AMBULATORY_CARE_PROVIDER_SITE_OTHER): Payer: Medicare Other | Admitting: Family Medicine

## 2022-02-27 ENCOUNTER — Encounter: Payer: Self-pay | Admitting: Family Medicine

## 2022-02-27 VITALS — BP 118/64 | HR 81 | Ht 59.0 in

## 2022-02-27 DIAGNOSIS — I25119 Atherosclerotic heart disease of native coronary artery with unspecified angina pectoris: Secondary | ICD-10-CM

## 2022-02-27 DIAGNOSIS — M75101 Unspecified rotator cuff tear or rupture of right shoulder, not specified as traumatic: Secondary | ICD-10-CM

## 2022-02-27 DIAGNOSIS — M25511 Pain in right shoulder: Secondary | ICD-10-CM | POA: Diagnosis not present

## 2022-02-27 DIAGNOSIS — M12811 Other specific arthropathies, not elsewhere classified, right shoulder: Secondary | ICD-10-CM | POA: Diagnosis not present

## 2022-02-27 NOTE — Assessment & Plan Note (Signed)
Chronic problem with worsening symptoms.  Aspirated over 100 cc today.  We discussed with patient again about any other treatment options including the possibility of a more localized drain, as well as the possibility of surgical intervention which patient declined.  Patient will come back again in 5 weeks and will consider the possibility of repeating a Toradol injection if necessary.  Would like to evaluate palpation at that time.

## 2022-02-27 NOTE — Telephone Encounter (Signed)
Pt called to follow up on yesterday's request for a call back. Pt states she got a bill for several hundred dollars for 08/29/21 and states she did not have an appt on that day. Pt states she already called [facility], Medicare, etc.....  Please call her back when you can.

## 2022-02-27 NOTE — Patient Instructions (Signed)
Happy holidays! I am sorry it keeps coming back  See me again in 5 weeks

## 2022-02-28 NOTE — Telephone Encounter (Signed)
Spoke to pt and advised that I do not see a visit from the billing date of 08/28/2021. Pt stated that she spoke with someone from billing and they stated the same thing. I told pt to do whatever billing advised. Pt stated she will forget about it.

## 2022-02-28 NOTE — Telephone Encounter (Signed)
Left message to return phone call.

## 2022-03-06 ENCOUNTER — Telehealth: Payer: Medicare Other

## 2022-03-27 NOTE — Progress Notes (Addendum)
[place] Phone: [phone] Subjective:   [name], am serving as a scribe for Dr. [name].  I'm seeing this patient by the request  of:  [name], NP  CC: shoulder pain follow up   [id number]  02/27/2022 Chronic problem with worsening symptoms.  Aspirated over 100 cc today.  We discussed with patient again about any other treatment options including the possibility of a more localized drain, as well as the possibility of surgical intervention which patient declined.  Patient will come back again in 5 weeks and will consider the possibility of repeating a Toradol injection if necessary.  Would like to evaluate palpation at that time.     Update 04/02/2022 Clova Morlock Canova is a 88 y.o. female coming in with complaint of R shoulder pain. Patient states that she does        Past Medical History:  Diagnosis Date   Arthritis    CAD (coronary artery disease)    Carotid stenosis, left    50-60% stable   Diabetes mellitus    Glaucoma    Hyperlipidemia    Hypertension    Leg pain    ABIs 2/18: normal bilaterally.   Osteoporosis    Valvular heart disease    Aortic Stenosis s/p [name] 2014 // Mitral stenosis // Echo 09/2018: EF > 65, mod LVH, severe focal basal hypertrophy, RVSP 39.8, mild to mod MR, mod to severe MS (mean 9), AVR with mild AI, severe LAE (similar to prior echo)   Past Surgical History:  Procedure Laterality Date   ANOMALOUS PULMONARY VENOUS RETURN REPAIR, TOTAL     APPENDECTOMY     CARDIAC VALVE REPLACEMENT  04/29/2012   Cataracts Bilateral    CESAREAN SECTION     FOOT SURGERY     [name]   HIP ARTHROPLASTY Right 07/31/2013   Procedure: ARTHROPLASTY BIPOLAR HIP;  Surgeon: [name], MD;  Location: [facility];  Service: Orthopedics;  Laterality: Right;   INTRAMEDULLARY (IM) NAIL INTERTROCHANTERIC Left 01/01/2020   Procedure: INTRAMEDULLARY (IM) NAIL INTERTROCHANTRIC;   Surgeon: Rod Can, MD;  Location: [facility];  Service: Orthopedics;  Laterality: Left;   PERCUTANEOUS CORONARY STENT INTERVENTION (PCI-S) N/A 01/02/2012   Procedure: PERCUTANEOUS CORONARY STENT INTERVENTION (PCI-S);  Surgeon: Sherren Mocha, MD;  Location: [facility] CATH LAB;  Service: Cardiovascular;  Laterality: N/A;   Social History   Socioeconomic History   Marital status: Widowed    Spouse name: Not on file   Number of children: Not on file   Years of education: Not on file   Highest education level: Not on file  Occupational History   Not on file  Tobacco Use   Smoking status: Never    Passive exposure: Yes   Smokeless tobacco: Never  Substance and Sexual Activity   Alcohol use: Yes    Alcohol/week: 0.0 standard drinks of alcohol    Comment: very seldom   Drug use: No   Sexual activity: Not Currently  Other Topics Concern   Not on file  Social History Narrative   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      She likes to read and she takes classes at [employer]: Low Risk  (06/25/2021)   Overall Financial Resource Strain (CARDIA)    Difficulty of Paying Living Expenses: Not  hard at all  Food Insecurity: No Food Insecurity (04/23/2021)   Hunger Vital Sign    Worried About Running Out of Food in the Last Year: Never true    Ran Out of Food in the Last Year: Never true  Transportation Needs: No Transportation Needs (06/25/2021)   PRAPARE - Hydrologist (Medical): No    Lack of Transportation (Non-Medical): No  Physical Activity: Inactive (04/23/2021)   Exercise Vital Sign    Days of Exercise per Week: 0 days    Minutes of Exercise per Session: 0 min  Stress: No Stress Concern Present (06/25/2021)   [place]    Feeling of Stress : Not at all  Social Connections: Moderately Integrated  (04/23/2021)   Social Connection and Isolation Panel [NHANES]    Frequency of Communication with Friends and Family: More than three times a week    Frequency of Social Gatherings with Friends and Family: More than three times a week    Attends Religious Services: More than 4 times per year    Active Member of [employer] or Organizations: Yes    Attends Archivist Meetings: More than 4 times per year    Marital Status: Widowed   Allergies  Allergen Reactions   Statins Other (See Comments)    Muscle aches   Gabapentin Other (See Comments)    SOMNOLENCE   Family History  Problem Relation Age of Onset   COPD Father    Cancer Father        lung cancer   Lung cancer Other      Current Outpatient Medications (Cardiovascular):    amLODipine (NORVASC) 5 MG tablet, TAKE 1 TABLET(5 MG) BY MOUTH DAILY   carvedilol (COREG) 3.125 MG tablet, TAKE 1 TABLET(3.125 MG) BY MOUTH TWICE DAILY   ezetimibe (ZETIA) 10 MG tablet, TAKE 1 TABLET BY MOUTH DAILY   furosemide (LASIX) 20 MG tablet, TAKE 1 TABLET(20 MG) BY MOUTH DAILY   Current Outpatient Medications (Analgesics):    acetaminophen (TYLENOL) 500 MG tablet, Take 2 tablets (1,000 mg total) by mouth 4 (four) times daily.   aspirin EC 81 MG tablet, Take 81 mg by mouth daily. Swallow whole.   Current Outpatient Medications (Other):    bimatoprost (LUMIGAN) 0.01 % SOLN, Place 1 drop into both eyes at bedtime.   Calcium Carb-Cholecalciferol 500-400 MG-UNIT TABS, Take 1 tablet by mouth daily.   Misc Natural Products (TART CHERRY ADVANCED PO), Take by mouth.   Multiple Vitamin (MULTIVITAMIN) tablet, Take 1 tablet by mouth daily.   Multiple Vitamins-Minerals (PRESERVISION AREDS 2+MULTI VIT PO), Take 2 capsules by mouth in the morning and at bedtime.   Reviewed prior external information including notes and imaging from  primary care provider As well as notes that were available from care everywhere and other healthcare systems.  Past  medical history, social, surgical and family history all reviewed in electronic medical record.  No pertanent information unless stated regarding to the chief complaint.   Review of Systems:  No headache, visual changes, nausea, vomiting, diarrhea, constipation, dizziness, abdominal pain, skin rash, fevers, chills, night sweats, weight loss, swollen lymph nodes, body aches, joint swelling, chest pain, shortness of breath, mood changes. POSITIVE muscle aches  Objective  Blood pressure 130/64, pulse 80, height '4\' 11"'$  (1.499 m), weight 140 lb (63.5 kg), SpO2 96 %.   General: No apparent distress alert and oriented x3 mood and affect normal, dressed appropriately.  HEENT: Pupils equal, extraocular movements intact  Respiratory: Patient's speak in full sentences and does not appear short of breath  Cardiovascular: No lower extremity edema, non tender, no erythema  Shoulder exam shows patient does have significant swelling noted again on the right shoulder.  is tender to palpation in this area.  patient has audible crepitus noted. Ambulates with the aid of a walker   Procedure: Real-time Ultrasound Guided Injection of right shoulder Device: GE Logiq Q7 Ultrasound guided injection is preferred based studies that show increased duration, increased effect, greater accuracy, decreased procedural pain, increased response rate, and decreased cost with ultrasound guided versus blind injection.  Verbal informed consent obtained.  Time-out conducted.  Noted no overlying erythema, induration, or other signs of local infection.  Skin prepped in a sterile fashion.  Local anesthesia: Topical Ethyl chloride.  With sterile technique and under real time ultrasound guidance: With an 18-gauge 1-1/2 needle patient was injected with 1 cc of 0.5% Marcaine and aspirated 70 cc of blood-tinged fluid from the anterior aspect of the shoulder then injected with 1 cc of kenalog '40mg'$ /mL Completed without difficulty  Pain  immediately improved suggesting accurate placement of the medication.  Advised to call if fevers/chills, erythema, induration, drainage, or persistent bleeding.  Impression: Technically successful ultrasound guided injection.    Impression and Recommendations:     The above documentation has been reviewed and is accurate and complete Lyndal Pulley, DO

## 2022-03-28 ENCOUNTER — Telehealth: Payer: Self-pay | Admitting: Adult Health

## 2022-03-28 ENCOUNTER — Other Ambulatory Visit: Payer: Self-pay | Admitting: Adult Health

## 2022-03-28 NOTE — Telephone Encounter (Signed)
Please advise 

## 2022-03-28 NOTE — Telephone Encounter (Signed)
Patient notified of update  and verbalized understanding. Pt stated that she didn't need the results her daughter just wanted to look over them. Copy mailed to pt [place] for daughter to review per pt request. No further action needed.

## 2022-03-28 NOTE — Telephone Encounter (Signed)
Pt is calling would like her blood work results

## 2022-04-01 DIAGNOSIS — I509 Heart failure, unspecified: Secondary | ICD-10-CM

## 2022-04-01 HISTORY — DX: Heart failure, unspecified: I50.9

## 2022-04-02 ENCOUNTER — Ambulatory Visit (INDEPENDENT_AMBULATORY_CARE_PROVIDER_SITE_OTHER): Payer: Medicare Other | Admitting: Family Medicine

## 2022-04-02 ENCOUNTER — Encounter: Payer: Self-pay | Admitting: Family Medicine

## 2022-04-02 ENCOUNTER — Ambulatory Visit: Payer: Self-pay

## 2022-04-02 VITALS — BP 130/64 | HR 80 | Ht 59.0 in | Wt 140.0 lb

## 2022-04-02 DIAGNOSIS — M75101 Unspecified rotator cuff tear or rupture of right shoulder, not specified as traumatic: Secondary | ICD-10-CM | POA: Diagnosis not present

## 2022-04-02 DIAGNOSIS — M25511 Pain in right shoulder: Secondary | ICD-10-CM | POA: Diagnosis not present

## 2022-04-02 DIAGNOSIS — M12811 Other specific arthropathies, not elsewhere classified, right shoulder: Secondary | ICD-10-CM | POA: Diagnosis not present

## 2022-04-02 NOTE — Patient Instructions (Signed)
Drained shoulder today See me again in 4-5 weeks

## 2022-04-02 NOTE — Assessment & Plan Note (Signed)
Patient given aspiration again today.  Tolerated the procedure well, discussed which activities to do and which ones to avoid, unable to do shoulder replacements secondary to her other comorbidities.  Patient would not do well she does not think with the recovery.  Discussed with patient about icing regimen and home exercises, which activities to do and which ones to avoid.  Patient will follow-up again in 5 weeks and will consider the possibility of another aspiration and Toradol injection if needed.

## 2022-04-05 ENCOUNTER — Other Ambulatory Visit: Payer: Self-pay | Admitting: Cardiovascular Disease

## 2022-04-05 DIAGNOSIS — I359 Nonrheumatic aortic valve disorder, unspecified: Secondary | ICD-10-CM

## 2022-04-05 DIAGNOSIS — I10 Essential (primary) hypertension: Secondary | ICD-10-CM

## 2022-04-05 DIAGNOSIS — I25119 Atherosclerotic heart disease of native coronary artery with unspecified angina pectoris: Secondary | ICD-10-CM

## 2022-04-10 ENCOUNTER — Ambulatory Visit: Payer: Medicare Other | Admitting: Family Medicine

## 2022-04-10 DIAGNOSIS — L538 Other specified erythematous conditions: Secondary | ICD-10-CM | POA: Diagnosis not present

## 2022-04-10 DIAGNOSIS — H61032 Chondritis of left external ear: Secondary | ICD-10-CM | POA: Diagnosis not present

## 2022-04-10 DIAGNOSIS — R208 Other disturbances of skin sensation: Secondary | ICD-10-CM | POA: Diagnosis not present

## 2022-04-10 DIAGNOSIS — D485 Neoplasm of uncertain behavior of skin: Secondary | ICD-10-CM | POA: Diagnosis not present

## 2022-04-15 ENCOUNTER — Ambulatory Visit (HOSPITAL_BASED_OUTPATIENT_CLINIC_OR_DEPARTMENT_OTHER): Payer: Medicare Other

## 2022-04-15 ENCOUNTER — Encounter: Payer: Self-pay | Admitting: Cardiovascular Disease

## 2022-04-15 ENCOUNTER — Ambulatory Visit (HOSPITAL_COMMUNITY): Payer: Medicare Other | Attending: Cardiovascular Disease | Admitting: Cardiovascular Disease

## 2022-04-15 VITALS — BP 140/60 | HR 77 | Ht 59.0 in | Wt 140.0 lb

## 2022-04-15 DIAGNOSIS — I359 Nonrheumatic aortic valve disorder, unspecified: Secondary | ICD-10-CM

## 2022-04-15 DIAGNOSIS — I25119 Atherosclerotic heart disease of native coronary artery with unspecified angina pectoris: Secondary | ICD-10-CM

## 2022-04-15 DIAGNOSIS — Z79899 Other long term (current) drug therapy: Secondary | ICD-10-CM | POA: Diagnosis not present

## 2022-04-15 DIAGNOSIS — I5032 Chronic diastolic (congestive) heart failure: Secondary | ICD-10-CM

## 2022-04-15 LAB — ECHOCARDIOGRAM COMPLETE
AR max vel: 0.99 cm2
AV Area VTI: 0.93 cm2
AV Area mean vel: 0.94 cm2
AV Mean grad: 18 mmHg
AV Peak grad: 28 mmHg
Ao pk vel: 2.64 m/s
Area-P 1/2: 3.24 cm2
Est EF: 55
MV M vel: 5.9 m/s
MV Peak grad: 139.4 mmHg
MV VTI: 0.92 cm2
P 1/2 time: 332 msec
S' Lateral: 3.1 cm

## 2022-04-15 MED ORDER — FUROSEMIDE 20 MG PO TABS: ORAL_TABLET | ORAL | 3 refills | Status: DC

## 2022-04-15 NOTE — Patient Instructions (Signed)
Medication Instructions:  INCREASE Furosemide to '40mg'$  (2 tablets) on Mondays and Fridays only, continue '20mg'$  (1 tablet) all other days *If you need a refill on your cardiac medications before your next appointment, please call your pharmacy*   Lab Work: NONE If you have labs (blood work) drawn today and your tests are completely normal, you will receive your results only by: [place] (if you have MyChart) OR A paper copy in the mail If you have any lab test that is abnormal or we need to change your treatment, we will call you to review the results.   Testing/Procedures: NONE   Follow-Up: At [facility], you and your health needs are our priority.  As part of our continuing mission to provide you with exceptional heart care, we have created designated Provider Care Teams.  These Care Teams include your primary Cardiologist (physician) and Advanced Practice Providers (APPs -  Physician Assistants and Nurse Practitioners) who all work together to provide you with the care you need, when you need it.  Your next appointment:   6 month(s)  Provider:   Sherren Mocha, MD

## 2022-04-15 NOTE — Progress Notes (Signed)
Cardiology Office Note:    Date:  04/15/2022   ID:  Sara Garza, DOB 01/07/1924, MRN [id number]  PCP:  [name], NP   [facility] Cardiologist:  Sherren Mocha, MD     Referring MD: [name], NP   Chief Complaint  Patient presents with   Shortness of Breath    History of Present Illness:    Sara Garza is a 89 y.o. female with a hx of coronary artery disease, aortic stenosis, and chronic diastolic heart failure, presenting for follow-up evaluation.  She underwent [name] for treatment of severe symptomatic aortic stenosis in 2014. She was treated with a 23 mm Medtronic Corevalve through the moderate risk SurTAVI aortic stenosis trial. She also is followed for moderate coronary artery disease which was evaluated with pressure wire analysis of the RCA and LCx demonstrating no hemodynamic evidence of flow-obstruction. 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    The patient is here with her daughter today.  She has been having more problems with shortness of breath.  She describes shortness of breath with low-level activity.  She has some orthopnea and PND as well.  Leg swelling has been stable.  No chest pain or pressure.  She has been reluctant to take Lasix consistently because of urinary incontinence which is a big problem for her.  Past Medical History:  Diagnosis Date   Arthritis    CAD (coronary artery disease)    Carotid stenosis, left    50-60% stable   Diabetes mellitus    Glaucoma    Hyperlipidemia    Hypertension    Leg pain    ABIs 2/18: normal bilaterally.   Osteoporosis    Valvular heart disease    Aortic Stenosis s/p [name] 2014 // Mitral stenosis // Echo 09/2018: EF > 65, mod LVH, severe focal basal hypertrophy, RVSP 39.8, mild to mod MR, mod to severe MS (mean 9), AVR with mild AI, severe LAE (similar to prior echo)    Past Surgical  History:  Procedure Laterality Date   ANOMALOUS PULMONARY VENOUS RETURN REPAIR, TOTAL     APPENDECTOMY     CARDIAC VALVE REPLACEMENT  04/29/2012   Cataracts Bilateral    CESAREAN SECTION     FOOT SURGERY     [name]   HIP ARTHROPLASTY Right 07/31/2013   Procedure: ARTHROPLASTY BIPOLAR HIP;  Surgeon: [name], MD;  Location: [facility];  Service: Orthopedics;  Laterality: Right;   INTRAMEDULLARY (IM) NAIL INTERTROCHANTERIC Left 01/01/2020   Procedure: INTRAMEDULLARY (IM) NAIL INTERTROCHANTRIC;  Surgeon: Rod Can, MD;  Location: [facility];  Service: Orthopedics;  Laterality: Left;   PERCUTANEOUS CORONARY STENT INTERVENTION (PCI-S) N/A 01/02/2012   Procedure: PERCUTANEOUS CORONARY STENT INTERVENTION (PCI-S);  Surgeon: Sherren Mocha, MD;  Location: [facility] CATH LAB;  Service: Cardiovascular;  Laterality: N/A;    Current Medications: Current Meds  Medication Sig   acetaminophen (TYLENOL) 500 MG tablet Take 2 tablets (1,000 mg total) by mouth 4 (four) times daily.   amLODipine (NORVASC) 5 MG tablet TAKE 1 TABLET(5 MG) BY MOUTH DAILY   aspirin EC 81 MG tablet Take 81 mg by mouth daily. Swallow whole.   Calcium Carb-Cholecalciferol 500-400 MG-UNIT TABS Take 1 tablet by mouth daily.   carvedilol (COREG) 3.125 MG tablet TAKE 1 TABLET(3.125 MG) BY MOUTH TWICE DAILY   ezetimibe (ZETIA) 10 MG tablet TAKE 1 TABLET BY  MOUTH DAILY   furosemide (LASIX) 20 MG tablet Take 2 tablets ('40mg'$ ) by mouth on Mondays and Fridays, take 1 tablet ('20mg'$ ) all other days   hydrocortisone 2.5 % cream Apply topically daily at 6 (six) AM. 2 weeks   Multiple Vitamin (MULTIVITAMIN) tablet Take 1 tablet by mouth daily.   Multiple Vitamins-Minerals (PRESERVISION AREDS 2+MULTI VIT PO) Take 2 capsules by mouth in the morning and at bedtime.   [DISCONTINUED] bimatoprost (LUMIGAN) 0.01 % SOLN Place 1 drop into both eyes at bedtime.   [DISCONTINUED] furosemide (LASIX) 20 MG tablet TAKE 1 TABLET(20 MG) BY MOUTH DAILY      Allergies:   Statins and Gabapentin   Social History   Socioeconomic History   Marital status: Widowed    Spouse name: Not on file   Number of children: Not on file   Years of education: Not on file   Highest education level: Not on file  Occupational History   Not on file  Tobacco Use   Smoking status: Never    Passive exposure: Yes   Smokeless tobacco: Never  Substance and Sexual Activity   Alcohol use: Yes    Alcohol/week: 0.0 standard drinks of alcohol    Comment: very seldom   Drug use: No   Sexual activity: Not Currently  Other Topics Concern   Not on file  Social History Narrative   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      She likes to read and she takes classes at [employer]: Low Risk  (06/25/2021)   Overall Financial Resource Strain (CARDIA)    Difficulty of Paying Living Expenses: Not hard at all  Food Insecurity: No Food Insecurity (04/23/2021)   Hunger Vital Sign    Worried About Running Out of Food in the Last Year: Never true    [place] in the Last Year: Never true  Transportation Needs: No Transportation Needs (06/25/2021)   PRAPARE - Hydrologist (Medical): No    Lack of Transportation (Non-Medical): No  Physical Activity: Inactive (04/23/2021)   Exercise Vital Sign    Days of Exercise per Week: 0 days    Minutes of Exercise per Session: 0 min  Stress: No Stress Concern Present (06/25/2021)   [place]    Feeling of Stress : Not at all  Social Connections: Moderately Integrated (04/23/2021)   Social Connection and Isolation Panel [NHANES]    Frequency of Communication with Friends and Family: More than three times a week    Frequency of Social Gatherings with Friends and Family: More than three times a week    Attends Religious Services: More than 4  times per year    Active Member of [employer] or Organizations: Yes    Attends Archivist Meetings: More than 4 times per year    Marital Status: Widowed     Family History: The patient's family history includes COPD in her father; Cancer in her father; Lung cancer in an other family member.  ROS:   Please see the history of present illness.    All other systems reviewed and are negative.  EKGs/Labs/Other Studies Reviewed:    The following studies were reviewed today: 2D echo images are personally reviewed from today's study.  The formal interpretation is pending.  The patient's  LV function continues to remain normal with notation of a chronic apical aneurysm but preserved LVEF.  The patient's [name] prosthesis appears to have stable parameters with a mean transvalvular gradient of 18 mmHg and moderate paravalvular regurgitation.  She has severe mitral annular calcification with a mean transmitral gradient of 10 mmHg.  EKG:  EKG is not ordered today.    Recent Labs: 02/19/2022: ALT 12; BUN 45; Creatinine, Ser 1.63; Hemoglobin 12.4; Platelets 223.0; Potassium 4.6; Sodium 141; TSH 1.67  Recent Lipid Panel    Component Value Date/Time   CHOL 209 (H) 02/19/2022 1034   TRIG 220.0 (H) 02/19/2022 1034   HDL 44.90 02/19/2022 1034   CHOLHDL 5 02/19/2022 1034   VLDL 44.0 (H) 02/19/2022 1034   LDLCALC 133 (H) 12/01/2019 1014   LDLDIRECT 139.0 02/19/2022 1034     Risk Assessment/Calculations:          Physical Exam:    VS:  BP (!) 140/60   Pulse 77   Ht '4\' 11"'$  (1.499 m)   Wt 140 lb (63.5 kg)   SpO2 96%   BMI 28.28 kg/m     Wt Readings from Last 3 Encounters:  04/15/22 140 lb (63.5 kg)  04/02/22 140 lb (63.5 kg)  02/19/22 139 lb (63 kg)     GEN: Pleasant elderly woman in no acute distress HEENT: Normal NECK: No JVD; No carotid bruits LYMPHATICS: No lymphadenopathy CARDIAC: RRR, 2/6 systolic murmur at the right upper sternal border RESPIRATORY:  Clear to auscultation  without rales, wheezing or rhonchi  ABDOMEN: Soft, non-tender, non-distended MUSCULOSKELETAL: 1+ bilateral ankle edema; No deformity  SKIN: Warm and dry NEUROLOGIC:  Alert and oriented x 3 PSYCHIATRIC:  Normal affect   ASSESSMENT:    1. Chronic diastolic CHF (congestive heart failure) ([place])   2. Medication management   3. Coronary artery disease involving native coronary artery of native heart with angina pectoris ([place])   4. Aortic valve disease    PLAN:    In order of problems listed above:  Progressive symptoms of shortness of breath and PND noted.  Will increase furosemide to 40 mg on Mondays and Fridays, otherwise 20 mg daily.  She will keep an eye on her symptoms.  Will arrange close clinical follow-up.  I have reviewed her echo as outlined above. Increasing furosemide as above.  Patient is somewhat reluctant to do this because she has a lot of problems with urinary incontinence.  We discussed options and this is the best treatment we can come up with today. Stable on carvedilol and amlodipine for antianginal therapy.  Continue low-dose aspirin. Patient with stable transvalvular gradients and moderate paravalvular regurgitation involving her [name] bioprosthesis.       Medication Adjustments/Labs and Tests Ordered: Current medicines are reviewed at length with the patient today.  Concerns regarding medicines are outlined above.  Orders Placed This Encounter  Procedures   Basic metabolic panel   Meds ordered this encounter  Medications   furosemide (LASIX) 20 MG tablet    Sig: Take 2 tablets ('40mg'$ ) by mouth on Mondays and Fridays, take 1 tablet ('20mg'$ ) all other days    Dispense:  108 tablet    Refill:  3    Patient Instructions  Medication Instructions:  INCREASE Furosemide to '40mg'$  (2 tablets) on Mondays and Fridays only, continue '20mg'$  (1 tablet) all other days *If you need a refill on your cardiac medications before your next appointment, please call your  pharmacy*   Lab Work: NONE If  you have labs (blood work) drawn today and your tests are completely normal, you will receive your results only by: [place] (if you have MyChart) OR A paper copy in the mail If you have any lab test that is abnormal or we need to change your treatment, we will call you to review the results.   Testing/Procedures: NONE   Follow-Up: At [facility], you and your health needs are our priority.  As part of our continuing mission to provide you with exceptional heart care, we have created designated Provider Care Teams.  These Care Teams include your primary Cardiologist (physician) and Advanced Practice Providers (APPs -  Physician Assistants and Nurse Practitioners) who all work together to provide you with the care you need, when you need it.  Your next appointment:   6 month(s)  Provider:   Sherren Mocha, MD        Signed, Sherren Mocha, MD  04/15/2022 1:18 PM    Sara Garza

## 2022-04-30 ENCOUNTER — Ambulatory Visit: Payer: Medicare Other | Admitting: Family Medicine

## 2022-05-01 ENCOUNTER — Telehealth: Payer: Self-pay | Admitting: *Deleted

## 2022-05-01 NOTE — Patient Outreach (Signed)
  Care Coordination   05/01/2022 Name: Sara Garza MRN: [id number] DOB: 04-24-1923   Care Coordination Outreach Attempts:  An unsuccessful telephone outreach was attempted today to offer the patient information about available care coordination services as a benefit of their health plan.   Follow Up Plan:  Additional outreach attempts will be made to offer the patient care coordination information and services.   Encounter Outcome:  No Answer   Care Coordination Interventions:  No, not indicated   Raina Mina, RN Care Management Coordinator [place] Office [phone]

## 2022-05-07 ENCOUNTER — Encounter: Payer: Self-pay | Admitting: Family Medicine

## 2022-05-07 ENCOUNTER — Telehealth: Payer: Medicare Other | Admitting: Family Medicine

## 2022-05-07 ENCOUNTER — Ambulatory Visit (INDEPENDENT_AMBULATORY_CARE_PROVIDER_SITE_OTHER): Payer: Medicare Other | Admitting: Family Medicine

## 2022-05-07 ENCOUNTER — Ambulatory Visit: Payer: Self-pay

## 2022-05-07 VITALS — BP 128/64 | HR 102 | Ht 59.0 in | Wt 140.0 lb

## 2022-05-07 DIAGNOSIS — M12811 Other specific arthropathies, not elsewhere classified, right shoulder: Secondary | ICD-10-CM | POA: Diagnosis not present

## 2022-05-07 DIAGNOSIS — I25119 Atherosclerotic heart disease of native coronary artery with unspecified angina pectoris: Secondary | ICD-10-CM

## 2022-05-07 DIAGNOSIS — M25511 Pain in right shoulder: Secondary | ICD-10-CM

## 2022-05-07 DIAGNOSIS — M75101 Unspecified rotator cuff tear or rupture of right shoulder, not specified as traumatic: Secondary | ICD-10-CM | POA: Diagnosis not present

## 2022-05-07 MED ORDER — KETOROLAC TROMETHAMINE 30 MG/ML IJ SOLN
30.0000 mg | Freq: Once | INTRAMUSCULAR | Status: AC
  Administered 2022-05-07: 30 mg via INTRAMUSCULAR

## 2022-05-07 NOTE — Progress Notes (Signed)
[place] Phone: [phone] Subjective:   [name], am serving as a scribe for Dr. [name].  I'm seeing this patient by the request  of:  [name], NP  CC: Right arm pain  SHF:[id number]  04/02/2022 Patient given aspiration again today.  Tolerated the procedure well, discussed which activities to do and which ones to avoid, unable to do shoulder replacements secondary to her other comorbidities.  Patient would not do well she does not think with the recovery.  Discussed with patient about icing regimen and home exercises, which activities to do and which ones to avoid.  Patient will follow-up again in 5 weeks and will consider the possibility of another aspiration and Toradol injection if needed   Updated 05/07/2022 Angeles Zehner Edgell is a 89 y.o. female coming in with complaint of R shoulder pain. Patient states she has some slight improvement.       Past Medical History:  Diagnosis Date   Arthritis    CAD (coronary artery disease)    Carotid stenosis, left    50-60% stable   Diabetes mellitus    Glaucoma    Hyperlipidemia    Hypertension    Leg pain    ABIs 2/18: normal bilaterally.   Osteoporosis    Valvular heart disease    Aortic Stenosis s/p [name] 2014 // Mitral stenosis // Echo 09/2018: EF > 65, mod LVH, severe focal basal hypertrophy, RVSP 39.8, mild to mod MR, mod to severe MS (mean 9), AVR with mild AI, severe LAE (similar to prior echo)   Past Surgical History:  Procedure Laterality Date   ANOMALOUS PULMONARY VENOUS RETURN REPAIR, TOTAL     APPENDECTOMY     CARDIAC VALVE REPLACEMENT  04/29/2012   Cataracts Bilateral    CESAREAN SECTION     FOOT SURGERY     [name]   HIP ARTHROPLASTY Right 07/31/2013   Procedure: ARTHROPLASTY BIPOLAR HIP;  Surgeon: [name], MD;  Location: [facility];  Service: Orthopedics;  Laterality: Right;   INTRAMEDULLARY (IM) NAIL INTERTROCHANTERIC Left  01/01/2020   Procedure: INTRAMEDULLARY (IM) NAIL INTERTROCHANTRIC;  Surgeon: Rod Can, MD;  Location: [facility];  Service: Orthopedics;  Laterality: Left;   PERCUTANEOUS CORONARY STENT INTERVENTION (PCI-S) N/A 01/02/2012   Procedure: PERCUTANEOUS CORONARY STENT INTERVENTION (PCI-S);  Surgeon: Sherren Mocha, MD;  Location: [facility] CATH LAB;  Service: Cardiovascular;  Laterality: N/A;   Social History   Socioeconomic History   Marital status: Widowed    Spouse name: Not on file   Number of children: Not on file   Years of education: Not on file   Highest education level: Not on file  Occupational History   Not on file  Tobacco Use   Smoking status: Never    Passive exposure: Yes   Smokeless tobacco: Never  Substance and Sexual Activity   Alcohol use: Yes    Alcohol/week: 0.0 standard drinks of alcohol    Comment: very seldom   Drug use: No   Sexual activity: Not Currently  Other Topics Concern   Not on file  Social History Narrative   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      She likes to read and she takes classes at [employer]: Low Risk  (06/25/2021)   Overall Financial Resource Strain (CARDIA)  Difficulty of Paying Living Expenses: Not hard at all  Food Insecurity: No Food Insecurity (04/23/2021)   Hunger Vital Sign    Worried About Running Out of Food in the Last Year: Never true    Ran Out of Food in the Last Year: Never true  Transportation Needs: No Transportation Needs (06/25/2021)   PRAPARE - Hydrologist (Medical): No    Lack of Transportation (Non-Medical): No  Physical Activity: Inactive (04/23/2021)   Exercise Vital Sign    Days of Exercise per Week: 0 days    Minutes of Exercise per Session: 0 min  Stress: No Stress Concern Present (06/25/2021)   [place]    Feeling  of Stress : Not at all  Social Connections: Moderately Integrated (04/23/2021)   Social Connection and Isolation Panel [NHANES]    Frequency of Communication with Friends and Family: More than three times a week    Frequency of Social Gatherings with Friends and Family: More than three times a week    Attends Religious Services: More than 4 times per year    Active Member of [employer] or Organizations: Yes    Attends Archivist Meetings: More than 4 times per year    Marital Status: Widowed   Allergies  Allergen Reactions   Statins Other (See Comments)    Muscle aches   Gabapentin Other (See Comments)    SOMNOLENCE   Family History  Problem Relation Age of Onset   COPD Father    Cancer Father        lung cancer   Lung cancer Other      Current Outpatient Medications (Cardiovascular):    amLODipine (NORVASC) 5 MG tablet, TAKE 1 TABLET(5 MG) BY MOUTH DAILY   carvedilol (COREG) 3.125 MG tablet, TAKE 1 TABLET(3.125 MG) BY MOUTH TWICE DAILY   ezetimibe (ZETIA) 10 MG tablet, TAKE 1 TABLET BY MOUTH DAILY   furosemide (LASIX) 20 MG tablet, Take 2 tablets ('40mg'$ ) by mouth on Mondays and Fridays, take 1 tablet ('20mg'$ ) all other days   Current Outpatient Medications (Analgesics):    acetaminophen (TYLENOL) 500 MG tablet, Take 2 tablets (1,000 mg total) by mouth 4 (four) times daily.   aspirin EC 81 MG tablet, Take 81 mg by mouth daily. Swallow whole.   Current Outpatient Medications (Other):    Calcium Carb-Cholecalciferol 500-400 MG-UNIT TABS, Take 1 tablet by mouth daily.   hydrocortisone 2.5 % cream, Apply topically daily at 6 (six) AM. 2 weeks   Multiple Vitamin (MULTIVITAMIN) tablet, Take 1 tablet by mouth daily.   Multiple Vitamins-Minerals (PRESERVISION AREDS 2+MULTI VIT PO), Take 2 capsules by mouth in the morning and at bedtime.   Reviewed prior external information including notes and imaging from  primary care provider As well as notes that were available from care  everywhere and other healthcare systems.  Past medical history, social, surgical and family history all reviewed in electronic medical record.  No pertanent information unless stated regarding to the chief complaint.   Review of Systems:  No headache, visual changes, nausea, vomiting, diarrhea, constipation, dizziness, abdominal pain, skin rash, fevers, chills, night sweats, weight loss, swollen lymph nodes, body aches,  chest pain, shortness of breath, mood changes. POSITIVE muscle aches, joint swelling  Objective  Blood pressure 128/64, pulse (!) 102, height '4\' 11"'$  (1.499 m), weight 140 lb (63.5 kg), SpO2 98 %.   General: No apparent distress alert and oriented x3  mood and affect normal, dressed appropriately.  HEENT: Pupils equal, extraocular movements intact  Respiratory: Patient's speak in full sentences and does not appear short of breath  Right shoulder has significant amount of swelling noted.  No significant crepitus noted.  Patient does have bone-on-bone contact noted of the shoulder in any range of motion.  Procedure: Real-time Ultrasound Guided Injection of right glenohumeral joint Device: GE Logiq Q7  Ultrasound guided injection is preferred based studies that show increased duration, increased effect, greater accuracy, decreased procedural pain, increased response rate with ultrasound guided versus blind injection.  Verbal informed consent obtained.  Time-out conducted.  Noted no overlying erythema, induration, or other signs of local infection.  Skin prepped in a sterile fashion.  Local anesthesia: Topical Ethyl chloride.  With sterile technique and under real time ultrasound guidance:  Joint visualized.  23g 1  inch needle inserted anterior approach. Pictures taken for needle placement. Patient did have injection of cc of 0.5% Marcaine, and aspirated 120 cc of straw-colored fluid with some mild blood-tinged.  Then injected 1.0 cc of Toradol 30 mg/dL. Completed without  difficulty  Pain immediately resolved suggesting accurate placement of the medication.  Advised to call if fevers/chills, erythema, induration, drainage, or persistent bleeding.  Impression: Technically successful ultrasound guided injection.    Impression and Recommendations:     The above documentation has been reviewed and is accurate and complete Lyndal Pulley, DO

## 2022-05-07 NOTE — Patient Instructions (Addendum)
Aspiration of shoulder today See me again in 5 weeks

## 2022-05-07 NOTE — Assessment & Plan Note (Signed)
Recurrent aspiration again.  Due to patient's age and comorbidities is not a surgical candidate.  Was able to get 120 cc today.  Discussed icing regimen and home exercises.  We discussed that we are going to alternate between steroid as well as the Toradol to see if this would make any difference in the reaccumulation.  Could consider more compression like postsurgery which patient has declined in the past. Follow-up with me again in 5 to 6 weeks

## 2022-05-08 ENCOUNTER — Telehealth: Payer: Self-pay | Admitting: Adult Health

## 2022-05-08 NOTE — Telephone Encounter (Signed)
Tried calling patient to schedule Medicare Annual Wellness Visit (AWV) either virtually or in office.    my jabber number [phone]  No answer    Last AWV 04/23/21 please schedule with Nurse Health Adviser   45 min for awv-i  in office appointments 30 min for awv-s & awv-i phone/virtual appointments

## 2022-05-21 ENCOUNTER — Ambulatory Visit: Payer: Medicare Other | Attending: Cardiovascular Disease

## 2022-05-21 DIAGNOSIS — Z79899 Other long term (current) drug therapy: Secondary | ICD-10-CM | POA: Diagnosis not present

## 2022-05-21 DIAGNOSIS — I5032 Chronic diastolic (congestive) heart failure: Secondary | ICD-10-CM | POA: Diagnosis not present

## 2022-05-22 ENCOUNTER — Ambulatory Visit (INDEPENDENT_AMBULATORY_CARE_PROVIDER_SITE_OTHER): Payer: Medicare Other

## 2022-05-22 VITALS — Ht 59.0 in | Wt 140.0 lb

## 2022-05-22 DIAGNOSIS — Z0000 Encounter for general adult medical examination without abnormal findings: Secondary | ICD-10-CM

## 2022-05-22 LAB — BASIC METABOLIC PANEL
BUN/Creatinine Ratio: 29 — ABNORMAL HIGH (ref 12–28)
BUN: 51 mg/dL — ABNORMAL HIGH (ref 10–36)
CO2: 21 mmol/L (ref 20–29)
Calcium: 8.9 mg/dL (ref 8.7–10.3)
Chloride: 106 mmol/L (ref 96–106)
Creatinine, Ser: 1.78 mg/dL — ABNORMAL HIGH (ref 0.57–1.00)
Glucose: 132 mg/dL — ABNORMAL HIGH (ref 70–99)
Potassium: 3.9 mmol/L (ref 3.5–5.2)
Sodium: 145 mmol/L — ABNORMAL HIGH (ref 134–144)
eGFR: 26 mL/min/{1.73_m2} — ABNORMAL LOW (ref >59)

## 2022-05-22 NOTE — Patient Instructions (Addendum)
Sara Garza , Thank you for taking time to come for your Medicare Wellness Visit. I appreciate your ongoing commitment to your health goals. Please review the following plan we discussed and let me know if I can assist you in the future.   These are the goals we discussed:  Goals       DIET - INCREASE WATER INTAKE (pt-stated)      Increase water throughout the day in small amounts.      patient      Continue to get out of the home 3 to 4 times a week Maybe eat lunch out 2 times a week  Continue to go to the [facility]    Try to add some variety to your diet         This is a list of the screening recommended for you and due dates:  Health Maintenance  Topic Date Due   Eye exam for diabetics  05/22/2022*   Flu Shot  06/30/2022*   COVID-19 Vaccine (6 - 2023-24 season) 02/19/2023*   Hemoglobin A1C  08/20/2022   Complete foot exam   02/20/2023   Medicare Annual Wellness Visit  05/23/2023   DTaP/Tdap/Td vaccine (4 - Tdap) 07/17/2023   Pneumonia Vaccine  Completed   DEXA scan (bone density measurement)  Completed   Zoster (Shingles) Vaccine  Completed   HPV Vaccine  Aged Out   Mammogram  Discontinued  *Topic was postponed. The date shown is not the original due date.    Advanced directives: Please bring a copy of your health care power of attorney and living will to the office to be added to your chart at your convenience.   Conditions/risks identified: None  Next appointment: Follow up in one year for your annual wellness visit     Preventive Care 65 Years and Older, Female Preventive care refers to lifestyle choices and visits with your health care provider that can promote health and wellness. What does preventive care include? A yearly physical exam. This is also called an annual well check. Dental exams once or twice a year. Routine eye exams. Ask your health care provider how often you should have your eyes checked. Personal lifestyle choices, including: Daily  care of your teeth and gums. Regular physical activity. Eating a healthy diet. Avoiding tobacco and drug use. Limiting alcohol use. Practicing safe sex. Taking low-dose aspirin every day. Taking vitamin and mineral supplements as recommended by your health care provider. What happens during an annual well check? The services and screenings done by your health care provider during your annual well check will depend on your age, overall health, lifestyle risk factors, and family history of disease. Counseling  Your health care provider may ask you questions about your: Alcohol use. Tobacco use. Drug use. Emotional well-being. Home and relationship well-being. Sexual activity. Eating habits. History of falls. Memory and ability to understand (cognition). Work and work Statistician. Reproductive health. Screening  You may have the following tests or measurements: Height, weight, and BMI. Blood pressure. Lipid and cholesterol levels. These may be checked every 5 years, or more frequently if you are over 6 years old. Skin check. Lung cancer screening. You may have this screening every year starting at age 54 if you have a 30-pack-year history of smoking and currently smoke or have quit within the past 15 years. Fecal occult blood test (FOBT) of the stool. You may have this test every year starting at age 69. Flexible sigmoidoscopy or colonoscopy. You may  have a sigmoidoscopy every 5 years or a colonoscopy every 10 years starting at age 64. Hepatitis C blood test. Hepatitis B blood test. Sexually transmitted disease (STD) testing. Diabetes screening. This is done by checking your blood sugar (glucose) after you have not eaten for a while (fasting). You may have this done every 1-3 years. Bone density scan. This is done to screen for osteoporosis. You may have this done starting at age 8. Mammogram. This may be done every 1-2 years. Talk to your health care provider about how often you  should have regular mammograms. Talk with your health care provider about your test results, treatment options, and if necessary, the need for more tests. Vaccines  Your health care provider may recommend certain vaccines, such as: Influenza vaccine. This is recommended every year. Tetanus, diphtheria, and acellular pertussis (Tdap, Td) vaccine. You may need a Td booster every 10 years. Zoster vaccine. You may need this after age 25. Pneumococcal 13-valent conjugate (PCV13) vaccine. One dose is recommended after age 68. Pneumococcal polysaccharide (PPSV23) vaccine. One dose is recommended after age 18. Talk to your health care provider about which screenings and vaccines you need and how often you need them. This information is not intended to replace advice given to you by your health care provider. Make sure you discuss any questions you have with your health care provider. Document Released: 04/14/2015 Document Revised: 12/06/2015 Document Reviewed: 01/17/2015 Elsevier Interactive Patient Education  2017 [place] Prevention in the Home Falls can cause injuries. They can happen to people of all ages. There are many things you can do to make your home safe and to help prevent falls. What can I do on the outside of my home? Regularly fix the edges of walkways and driveways and fix any cracks. Remove anything that might make you trip as you walk through a door, such as a raised step or threshold. Trim any bushes or trees on the path to your home. Use bright outdoor lighting. Clear any walking paths of anything that might make someone trip, such as rocks or tools. Regularly check to see if handrails are loose or broken. Make sure that both sides of any steps have handrails. Any raised decks and porches should have guardrails on the edges. Have any leaves, snow, or ice cleared regularly. Use sand or salt on walking paths during winter. Clean up any spills in your garage right away.  This includes oil or grease spills. What can I do in the bathroom? Use night lights. Install grab bars by the toilet and in the tub and shower. Do not use towel bars as grab bars. Use non-skid mats or decals in the tub or shower. If you need to sit down in the shower, use a plastic, non-slip stool. Keep the floor dry. Clean up any water that spills on the floor as soon as it happens. Remove soap buildup in the tub or shower regularly. Attach bath mats securely with double-sided non-slip rug tape. Do not have throw rugs and other things on the floor that can make you trip. What can I do in the bedroom? Use night lights. Make sure that you have a light by your bed that is easy to reach. Do not use any sheets or blankets that are too big for your bed. They should not hang down onto the floor. Have a firm chair that has side arms. You can use this for support while you get dressed. Do not have throw rugs  and other things on the floor that can make you trip. What can I do in the kitchen? Clean up any spills right away. Avoid walking on wet floors. Keep items that you use a lot in easy-to-reach places. If you need to reach something above you, use a strong step stool that has a grab bar. Keep electrical cords out of the way. Do not use floor polish or wax that makes floors slippery. If you must use wax, use non-skid floor wax. Do not have throw rugs and other things on the floor that can make you trip. What can I do with my stairs? Do not leave any items on the stairs. Make sure that there are handrails on both sides of the stairs and use them. Fix handrails that are broken or loose. Make sure that handrails are as long as the stairways. Check any carpeting to make sure that it is firmly attached to the stairs. Fix any carpet that is loose or worn. Avoid having throw rugs at the top or bottom of the stairs. If you do have throw rugs, attach them to the floor with carpet tape. Make sure that  you have a light switch at the top of the stairs and the bottom of the stairs. If you do not have them, ask someone to add them for you. What else can I do to help prevent falls? Wear shoes that: Do not have high heels. Have rubber bottoms. Are comfortable and fit you well. Are closed at the toe. Do not wear sandals. If you use a stepladder: Make sure that it is fully opened. Do not climb a closed stepladder. Make sure that both sides of the stepladder are locked into place. Ask someone to hold it for you, if possible. Clearly mark and make sure that you can see: Any grab bars or handrails. First and last steps. Where the edge of each step is. Use tools that help you move around (mobility aids) if they are needed. These include: Canes. Walkers. Scooters. Crutches. Turn on the lights when you go into a dark area. Replace any light bulbs as soon as they burn out. Set up your furniture so you have a clear path. Avoid moving your furniture around. If any of your floors are uneven, fix them. If there are any pets around you, be aware of where they are. Review your medicines with your doctor. Some medicines can make you feel dizzy. This can increase your chance of falling. Ask your doctor what other things that you can do to help prevent falls. This information is not intended to replace advice given to you by your health care provider. Make sure you discuss any questions you have with your health care provider. Document Released: 01/12/2009 Document Revised: 08/24/2015 Document Reviewed: 04/22/2014 Elsevier Interactive Patient Education  2017 [employer].

## 2022-05-22 NOTE — Progress Notes (Signed)
Subjective:   Sara Garza is a 89 y.o. female who presents for Medicare Annual (Subsequent) preventive examination.  Review of Systems   Virtual Visit via Telephone Note  I connected with  Sara Garza on 05/22/22 at 11:30 AM EST by telephone and verified that I am speaking with the correct person using two identifiers.  Location: Patient: Home Provider: Office Persons participating in the virtual visit: patient/Nurse Health Advisor   I discussed the limitations, risks, security and privacy concerns of performing an evaluation and management service by telephone and the availability of in person appointments. The patient expressed understanding and agreed to proceed.  Interactive audio and video telecommunications were attempted between this nurse and patient, however failed, due to patient having technical difficulties OR patient did not have access to video capability.  We continued and completed visit with audio only.  Some vital signs may be absent or patient reported.   Sara Peaches, LPN  Cardiac Risk Factors include: advanced age (>40mn, >>60women);hypertension     Objective:    Today's Vitals   05/22/22 1143  Weight: 140 lb (63.5 kg)  Height: 4' 11"$  (1.499 m)   Body mass index is 28.28 kg/m.     05/22/2022   11:49 AM 06/25/2021    1:24 PM 04/23/2021   11:35 AM 02/14/2020    3:17 PM 12/30/2019    4:35 PM 12/30/2019    2:35 PM 12/08/2017   12:20 PM  Advanced Directives  Does Patient Have a Medical Advance Directive? Yes Yes Yes Yes Yes Yes Yes  Type of AParamedicof AL[place]erneLiving will [place]Living will H[place]Living will HGages LakeLiving will [place]opake FallsLiving will [place]  Does patient want to make changes to medical advance directive?  No - Patient declined No - Patient declined No - Patient declined No - Patient declined No -  Patient declined   Copy of HPatt[place]in Chart? No - copy requested No - copy requested No - copy requested No - copy requested No - copy requested No - copy requested     Current Medications (verified) Outpatient Encounter Medications as of 05/22/2022  Medication Sig   acetaminophen (TYLENOL) 500 MG tablet Take 2 tablets (1,000 mg total) by mouth 4 (four) times daily.   amLODipine (NORVASC) 5 MG tablet TAKE 1 TABLET(5 MG) BY MOUTH DAILY   aspirin EC 81 MG tablet Take 81 mg by mouth daily. Swallow whole.   Calcium Carb-Cholecalciferol 500-400 MG-UNIT TABS Take 1 tablet by mouth daily.   carvedilol (COREG) 3.125 MG tablet TAKE 1 TABLET(3.125 MG) BY MOUTH TWICE DAILY   ezetimibe (ZETIA) 10 MG tablet TAKE 1 TABLET BY MOUTH DAILY   furosemide (LASIX) 20 MG tablet Take 2 tablets (464m by mouth on Mondays and Fridays, take 1 tablet (2047mall other days   hydrocortisone 2.5 % cream Apply topically daily at 6 (six) AM. 2 weeks   Multiple Vitamin (MULTIVITAMIN) tablet Take 1 tablet by mouth daily.   Multiple Vitamins-Minerals (PRESERVISION AREDS 2+MULTI VIT PO) Take 2 capsules by mouth in the morning and at bedtime.   No facility-administered encounter medications on file as of 05/22/2022.    Allergies (verified) Statins and Gabapentin   History: Past Medical History:  Diagnosis Date   Arthritis    CAD (coronary artery disease)    Carotid stenosis, left    50-60% stable   Diabetes  mellitus    Glaucoma    Hyperlipidemia    Hypertension    Leg pain    ABIs 2/18: normal bilaterally.   Osteoporosis    Valvular heart disease    Aortic Stenosis s/p [name] 2014 // Mitral stenosis // Echo 09/2018: EF > 65, mod LVH, severe focal basal hypertrophy, RVSP 39.8, mild to mod MR, mod to severe MS (mean 9), AVR with mild AI, severe LAE (similar to prior echo)   Past Surgical History:  Procedure Laterality Date   ANOMALOUS PULMONARY VENOUS RETURN REPAIR, TOTAL     APPENDECTOMY      CARDIAC VALVE REPLACEMENT  04/29/2012   Cataracts Bilateral    CESAREAN SECTION     FOOT SURGERY     Sara Garza   HIP ARTHROPLASTY Right 07/31/2013   Procedure: ARTHROPLASTY BIPOLAR HIP;  Surgeon: Sara Pole, MD;  Location: [facility];  Service: Orthopedics;  Laterality: Right;   INTRAMEDULLARY (IM) NAIL INTERTROCHANTERIC Left 01/01/2020   Procedure: INTRAMEDULLARY (IM) NAIL INTERTROCHANTRIC;  Surgeon: Sara Can, MD;  Location: [facility];  Service: Orthopedics;  Laterality: Left;   PERCUTANEOUS CORONARY STENT INTERVENTION (PCI-S) N/A 01/02/2012   Procedure: PERCUTANEOUS CORONARY STENT INTERVENTION (PCI-S);  Surgeon: Sara Mocha, MD;  Location: [facility] CATH LAB;  Service: Cardiovascular;  Laterality: N/A;   Family History  Problem Relation Age of Onset   COPD Father    Cancer Father        lung cancer   Lung cancer Other    Social History   Socioeconomic History   Marital status: Widowed    Spouse name: Not on file   Number of children: Not on file   Years of education: Not on file   Highest education level: Not on file  Occupational History   Not on file  Tobacco Use   Smoking status: Never    Passive exposure: Yes   Smokeless tobacco: Never  Substance and Sexual Activity   Alcohol use: Yes    Alcohol/week: 0.0 standard drinks of alcohol    Comment: very seldom   Drug use: No   Sexual activity: Not Currently  Other Topics Concern   Not on file  Social History Narrative   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      She likes to read and she takes classes at [employer]: Low Risk  (05/22/2022)   Overall Financial Resource Strain (CARDIA)    Difficulty of Paying Living Expenses: Not hard at all  Food Insecurity: No Food Insecurity (05/22/2022)   Hunger Vital Sign    Worried About Running Out of Food in the Last Year: Never true    [place] in the Last Year: Never true   Transportation Needs: No Transportation Needs (05/22/2022)   PRAPARE - Hydrologist (Medical): No    Lack of Transportation (Non-Medical): No  Physical Activity: Inactive (05/22/2022)   Exercise Vital Sign    Days of Exercise per Week: 0 days    Minutes of Exercise per Session: 0 min  Stress: No Stress Concern Present (05/22/2022)   [place]    Feeling of Stress : Not at all  Social Connections: Moderately Integrated (05/22/2022)   Social Connection and Isolation Panel [NHANES]    Frequency of Communication with Friends and Family: More than  three times a week    Frequency of Social Gatherings with Friends and Family: More than three times a week    Attends Religious Services: More than 4 times per year    Active Member of Clubs or Organizations: Yes    Attends Archivist Meetings: More than 4 times per year    Marital Status: Widowed    Tobacco Counseling Counseling given: Not Answered   Clinical Intake:  Pre-visit preparation completed: No  Pain : No/denies pain     BMI - recorded: 28.28 Nutritional Risks: None Diabetes: No  How often do you need to have someone help you when you read instructions, pamphlets, or other written materials from your doctor or pharmacy?: 1 - Never  Diabetic?  No  Interpreter Needed?: No  Information entered by :: Rolene Arbour LPN   Activities of Daily Living    05/22/2022   11:47 AM  In your present state of health, do you have any difficulty performing the following activities:  Hearing? 1  Comment Wears hearing aids  Vision? 0  Difficulty concentrating or making decisions? 0  Walking or climbing stairs? 0  Dressing or bathing? 0  Doing errands, shopping? 0  Preparing Food and eating ? N  Using the Toilet? N  In the past six months, have you accidently leaked urine? N  Do you have problems with loss of bowel control? N   Managing your Medications? N  Managing your Finances? N  Housekeeping or managing your Housekeeping? N    Patient Care Team: [name], NP as PCP - General (Family Medicine) Sara Mocha, MD as PCP - Cardiology (Cardiology) Sara Mocha, MD (Cardiology) Suella Broad, MD as Consulting Physician (Physical Medicine and Rehabilitation) Irine Seal, MD as Attending Physician (Urology) Viona Gilmore, [facility] (Inactive) as Pharmacist (Pharmacist)  Indicate any recent Medical Services you may have received from other than [facility] providers in the past year (date may be approximate).     Assessment:   This is a routine wellness examination for [place].  Hearing/Vision screen Hearing Screening - Comments:: Wears hearing aids Vision Screening - Comments:: Wears rx glasses - up to date with routine eye exams with  [facility]  Dietary issues and exercise activities discussed: Exercise limited by: None identified   Goals Addressed               This Visit's Progress     DIET - INCREASE WATER INTAKE (pt-stated)        Increase water throughout the day in small amounts.       Depression Screen    05/22/2022   11:47 AM 05/22/2022   11:46 AM 06/25/2021    1:23 PM 04/23/2021   11:28 AM 02/16/2021    3:13 PM 12/01/2019    9:37 AM 12/08/2017   12:21 PM  PHQ 2/9 Scores  PHQ - 2 Score 0 0 0 0 0 0 0    Fall Risk    05/22/2022   11:48 AM 08/20/2021    1:30 PM 06/25/2021    1:22 PM 04/23/2021   11:31 AM 02/16/2021    3:07 PM  [place] in the past year? 0  0 0 0  Number falls in past yr: 0 0 0 0 0  Injury with Fall? 0  0 0 0  Risk for fall due to : No Fall Risks Impaired balance/gait;History of fall(s);Impaired mobility History of fall(s);Impaired balance/gait;Impaired mobility    Follow up Falls prevention  discussed Education provided;Falls prevention discussed Education provided;Falls prevention discussed      FALL RISK PREVENTION PERTAINING TO THE  HOME:  Any stairs in or around the home? No  If so, are there any without handrails? No  Home free of loose throw rugs in walkways, pet beds, electrical cords, etc? Yes  Adequate lighting in your home to reduce risk of falls? Yes   ASSISTIVE DEVICES UTILIZED TO PREVENT FALLS:  Life alert? Yes  Use of a cane, walker or w/c? Yes  Grab bars in the bathroom? Yes  Shower chair or bench in shower? Yes  Elevated toilet seat or a handicapped toilet? Yes   TIMED UP AND GO:  Was the test performed? No .Audio Visit    Cognitive Function:        05/22/2022   11:49 AM 04/23/2021   11:33 AM  6CIT Screen  What Year? 0 points 0 points  What month? 0 points 0 points  What time? 0 points 0 points  Count back from 20 0 points 0 points  Months in reverse 0 points 0 points  Repeat phrase 0 points 0 points  Total Score 0 points 0 points    Immunizations Immunization History  Administered Date(s) Administered   COVID-19, mRNA, vaccine(Comirnaty)12 years and older 02/15/2022   Fluad Quad(high Dose 65+) 12/15/2018   Hepatitis A 10/09/2005   Influenza Split 01/02/2011, 12/24/2011   Influenza Whole 04/01/2005, 01/01/2007, 01/02/2008, 01/06/2009, 01/17/2010   Influenza, High Dose Seasonal PF 01/04/2014, 01/06/2015, 01/09/2016, 01/07/2017, 12/25/2017   Influenza,inj,Quad PF,6+ Mos 12/25/2012   Influenza-Unspecified 01/04/2020   PFIZER(Purple Top)SARS-COV-2 Vaccination 04/02/2019, 04/29/2019, 12/30/2019   Pfizer Covid-19 Vaccine Bivalent Booster 39yr & up 01/26/2021   Pneumococcal Conjugate-13 02/22/2013   Pneumococcal Polysaccharide-23 04/01/2005   Td 04/01/2000   Td (Adult), 2 Lf Tetanus Toxid, Preservative Free 04/01/2000   Tetanus 07/16/2013   Zoster Recombinat (Shingrix) 11/29/2018, 02/05/2019   Zoster, Live 10/09/2005    TDAP status: Up to date  Flu Vaccine status: Up to date  Pneumococcal vaccine status: Up to date  Covid-19 vaccine status: Completed vaccines  Qualifies for  Shingles Vaccine? Yes   Zostavax completed Yes   Shingrix Completed?: Yes  Screening Tests Health Maintenance  Topic Date Due   OPHTHALMOLOGY EXAM  05/22/2022 (Originally 07/19/2021)   INFLUENZA VACCINE  06/30/2022 (Originally 10/30/2021)   COVID-19 Vaccine (6 - 2023-24 season) 02/19/2023 (Originally 04/12/2022)   HEMOGLOBIN A1C  08/20/2022   FOOT EXAM  02/20/2023   Medicare Annual Wellness (AWV)  05/23/2023   DTaP/Tdap/Td (4 - Tdap) 07/17/2023   Pneumonia Vaccine 89 Years old  Completed   DEXA SCAN  Completed   Zoster Vaccines- Shingrix  Completed   HPV VACCINES  Aged Out   MAMMOGRAM  Discontinued    Health Maintenance  There are no preventive care reminders to display for this patient.   Colorectal cancer screening: No longer required.   Mammogram status: No longer required due to Age.  Bone Density status: Completed 03/17/13. Results reflect: Bone density results: OSTEOPOROSIS. Repeat every   years.  Lung Cancer Screening: (Low Dose CT Chest recommended if Age 77104-80years, 30 pack-year currently smoking OR have quit w/in 15years.) does not qualify.     Additional Screening:  Hepatitis C Screening: does not qualify; Completed   Vision Screening: Recommended annual ophthalmology exams for early detection of glaucoma and other disorders of the eye. Is the patient up to date with their annual eye exam?  Yes  Who is the  provider or what is the name of the office in which the patient attends annual eye exams? [facility] If pt is not established with a provider, would they like to be referred to a provider to establish care? No .   Dental Screening: Recommended annual dental exams for proper oral hygiene  Community Resource Referral / Chronic Care Management:  CRR required this visit?  No   CCM required this visit?  No      Plan:     I have personally reviewed and noted the following in the patient's chart:   Medical and social history Use of alcohol,  tobacco or illicit drugs  Current medications and supplements including opioid prescriptions. Patient is not currently taking opioid prescriptions. Functional ability and status Nutritional status Physical activity Advanced directives List of other physicians Hospitalizations, surgeries, and ER visits in previous 12 months Vitals Screenings to include cognitive, depression, and falls Referrals and appointments  In addition, I have reviewed and discussed with patient certain preventive protocols, quality metrics, and best practice recommendations. A written personalized care plan for preventive services as well as general preventive health recommendations were provided to patient.     Sara Peaches, LPN   [id number]   Nurse Notes: None

## 2022-05-24 ENCOUNTER — Telehealth: Payer: Self-pay | Admitting: Cardiovascular Disease

## 2022-05-24 NOTE — Telephone Encounter (Signed)
Pt is calling in to get the results from labs she had done on 2/20. Please advise.

## 2022-05-24 NOTE — Telephone Encounter (Signed)
Returned call to patient and left detailed message per [facility] stating that I don't have MD's interpretation on her labs, but would call her as soon as I did. Did inform her that they were stable from prior, but couldn't give details until MD reviews.

## 2022-06-10 NOTE — Progress Notes (Unsigned)
Sara Garza [address] [place] Phone: [phone] Subjective:   Sara Garza, am serving as a scribe for Dr. [name].  I'm seeing this patient by the request  of:  Sara Peng, NP  CC: Right shoulder pain  [id number]  05/07/2022 Recurrent aspiration again.  Due to patient's age and comorbidities is not a surgical candidate.  Was able to get 120 cc today.  Discussed icing regimen and home exercises.  We discussed that we are going to alternate between steroid as well as the Toradol to see if this would make any difference in the reaccumulation.  Could consider more compression like postsurgery which patient has declined in the past. Follow-up with me again in 5 to 6 weeks  Updated 06/11/2022 Sara Garza is a 89 y.o. female coming in with complaint of shoulder pain Has had more swelling recently, affecting daily activities, waking her up at night again.  Has known severe rotator cuff arthropathy      Past Medical History:  Diagnosis Date   Arthritis    CAD (coronary artery disease)    Carotid stenosis, left    50-60% stable   Diabetes mellitus    Glaucoma    Hyperlipidemia    Hypertension    Leg pain    ABIs 2/18: normal bilaterally.   Osteoporosis    Valvular heart disease    Aortic Stenosis s/p [name] 2014 // Mitral stenosis // Echo 09/2018: EF > 65, mod LVH, severe focal basal hypertrophy, RVSP 39.8, mild to mod MR, mod to severe MS (mean 9), AVR with mild AI, severe LAE (similar to prior echo)   Past Surgical History:  Procedure Laterality Date   ANOMALOUS PULMONARY VENOUS RETURN REPAIR, TOTAL     APPENDECTOMY     CARDIAC VALVE REPLACEMENT  04/29/2012   Cataracts Bilateral    CESAREAN SECTION     FOOT SURGERY     [name]   HIP ARTHROPLASTY Right 07/31/2013   Procedure: ARTHROPLASTY BIPOLAR HIP;  Surgeon: [name], MD;  Location: [facility];  Service: Orthopedics;  Laterality: Right;   INTRAMEDULLARY  (IM) NAIL INTERTROCHANTERIC Left 01/01/2020   Procedure: INTRAMEDULLARY (IM) NAIL INTERTROCHANTRIC;  Surgeon: Rod Can, MD;  Location: [facility];  Service: Orthopedics;  Laterality: Left;   PERCUTANEOUS CORONARY STENT INTERVENTION (PCI-S) N/A 01/02/2012   Procedure: PERCUTANEOUS CORONARY STENT INTERVENTION (PCI-S);  Surgeon: Sherren Mocha, MD;  Location: [facility] CATH LAB;  Service: Cardiovascular;  Laterality: N/A;   Social History   Socioeconomic History   Marital status: Widowed    Spouse name: Not on file   Number of children: Not on file   Years of education: Not on file   Highest education level: Not on file  Occupational History   Not on file  Tobacco Use   Smoking status: Never    Passive exposure: Yes   Smokeless tobacco: Never  Substance and Sexual Activity   Alcohol use: Yes    Alcohol/week: 0.0 standard drinks of alcohol    Comment: very seldom   Drug use: No   Sexual activity: Not Currently  Other Topics Concern   Not on file  Social History Narrative   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      She likes to read and she takes classes at [employer] of [employer]: Low Risk  (  05/22/2022)   Overall Financial Resource Strain (CARDIA)    Difficulty of Paying Living Expenses: Not hard at all  Food Insecurity: No Food Insecurity (05/22/2022)   Hunger Vital Sign    Worried About Running Out of Food in the Last Year: Never true    Ran Out of Food in the Last Year: Never true  Transportation Needs: No Transportation Needs (05/22/2022)   PRAPARE - Hydrologist (Medical): No    Lack of Transportation (Non-Medical): No  Physical Activity: Inactive (05/22/2022)   Exercise Vital Sign    Days of Exercise per Week: 0 days    Minutes of Exercise per Session: 0 min  Stress: No Stress Concern Present (05/22/2022)   [place]    Feeling of Stress : Not at all  Social Connections: Moderately Integrated (05/22/2022)   Social Connection and Isolation Panel [NHANES]    Frequency of Communication with Friends and Family: More than three times a week    Frequency of Social Gatherings with Friends and Family: More than three times a week    Attends Religious Services: More than 4 times per year    Active Member of [employer] or Organizations: Yes    Attends Archivist Meetings: More than 4 times per year    Marital Status: Widowed   Allergies  Allergen Reactions   Statins Other (See Comments)    Muscle aches   Gabapentin Other (See Comments)    SOMNOLENCE   Family History  Problem Relation Age of Onset   COPD Father    Cancer Father        lung cancer   Lung cancer Other      Current Outpatient Medications (Cardiovascular):    amLODipine (NORVASC) 5 MG tablet, TAKE 1 TABLET(5 MG) BY MOUTH DAILY   carvedilol (COREG) 3.125 MG tablet, TAKE 1 TABLET(3.125 MG) BY MOUTH TWICE DAILY   ezetimibe (ZETIA) 10 MG tablet, TAKE 1 TABLET BY MOUTH DAILY   furosemide (LASIX) 20 MG tablet, Take 2 tablets ('40mg'$ ) by mouth on Mondays and Fridays, take 1 tablet ('20mg'$ ) all other days   Current Outpatient Medications (Analgesics):    acetaminophen (TYLENOL) 500 MG tablet, Take 2 tablets (1,000 mg total) by mouth 4 (four) times daily.   aspirin EC 81 MG tablet, Take 81 mg by mouth daily. Swallow whole.   Current Outpatient Medications (Other):    Calcium Carb-Cholecalciferol 500-400 MG-UNIT TABS, Take 1 tablet by mouth daily.   hydrocortisone 2.5 % cream, Apply topically daily at 6 (six) AM. 2 weeks   Multiple Vitamin (MULTIVITAMIN) tablet, Take 1 tablet by mouth daily.   Multiple Vitamins-Minerals (PRESERVISION AREDS 2+MULTI VIT PO), Take 2 capsules by mouth in the morning and at bedtime.   Reviewed prior external information including notes and imaging from  primary care provider As well as  notes that were available from care everywhere and other healthcare systems.  Past medical history, social, surgical and family history all reviewed in electronic medical record.  No pertanent information unless stated regarding to the chief complaint.   Review of Systems:  No headache, visual changes, nausea, vomiting, diarrhea, constipation, dizziness, abdominal pain, skin rash, fevers, chills, night sweats, weight loss, swollen lymph nodes, body acheschest pain, shortness of breath, mood changes. POSITIVE muscle aches  Objective  Height '4\' 11"'$  (1.499 m).   General: No apparent distress alert and oriented x3 mood and affect normal, dressed appropriately.  Walking with the aid of a walker.  2+ pitting edema of the lower extremities.. Right shoulder does have a effusion noted.  Limited strength noted as well.  Procedure: Real-time Ultrasound Guided Injection of right glenohumeral joint Device: GE Logiq Q7  Ultrasound guided injection is preferred based studies that show increased duration, increased effect, greater accuracy, decreased procedural pain, increased response rate with ultrasound guided versus blind injection.  Verbal informed consent obtained.  Time-out conducted.  Noted no overlying erythema, induration, or other signs of local infection.  Skin prepped in a sterile fashion.  Local anesthesia: Topical Ethyl chloride.  With sterile technique and under real time ultrasound guidance:  Joint visualized.  With an 18-gauge 1-1/2 inch needle patient was injected on the anterior aspect with a total of 2 cc of 0.5% Marcaine and then aspirated 80 cc of blood-tinged fluid.  Patient then was injected with 1 cc of Kenalog 40 mg/mL Completed without difficulty  Pain immediately resolved suggesting accurate placement of the medication.  Advised to call if fevers/chills, erythema, induration, drainage, or persistent bleeding.  Impression: Technically successful ultrasound guided injection.     Impression and Recommendations:    The above documentation has been reviewed and is accurate and complete Lyndal Pulley, DO

## 2022-06-11 ENCOUNTER — Ambulatory Visit (INDEPENDENT_AMBULATORY_CARE_PROVIDER_SITE_OTHER): Payer: Medicare Other | Admitting: Family Medicine

## 2022-06-11 ENCOUNTER — Ambulatory Visit: Payer: Self-pay

## 2022-06-11 ENCOUNTER — Encounter: Payer: Self-pay | Admitting: Family Medicine

## 2022-06-11 VITALS — Ht 59.0 in

## 2022-06-11 DIAGNOSIS — M75101 Unspecified rotator cuff tear or rupture of right shoulder, not specified as traumatic: Secondary | ICD-10-CM

## 2022-06-11 DIAGNOSIS — M12811 Other specific arthropathies, not elsewhere classified, right shoulder: Secondary | ICD-10-CM | POA: Diagnosis not present

## 2022-06-11 DIAGNOSIS — M25511 Pain in right shoulder: Secondary | ICD-10-CM

## 2022-06-11 NOTE — Assessment & Plan Note (Addendum)
Repeat aspiration occurred again today.  Discussed potentially going towards Toradol in the next 1 to spread out the steroid if necessary.  Discussed with patient again about icing regimen, compression.  Patient understands SDOH is her age and not a surgical appropriate  RTC in 5-6 weeks

## 2022-06-11 NOTE — Patient Instructions (Signed)
Good to see you.  If redness of leg gets worse call me and we will send in doxycycline.  Otherwise see me again in 5-6 weeks birthday girl!

## 2022-06-13 ENCOUNTER — Other Ambulatory Visit: Payer: Self-pay | Admitting: Cardiovascular Disease

## 2022-06-17 ENCOUNTER — Telehealth: Payer: Self-pay | Admitting: Adult Health

## 2022-06-17 ENCOUNTER — Telehealth: Payer: Self-pay | Admitting: Cardiovascular Disease

## 2022-06-17 DIAGNOSIS — I5032 Chronic diastolic (congestive) heart failure: Secondary | ICD-10-CM

## 2022-06-17 MED ORDER — FUROSEMIDE 20 MG PO TABS: 40.0000 mg | ORAL_TABLET | Freq: Every day | ORAL | 3 refills | Status: DC

## 2022-06-17 MED ORDER — POTASSIUM CHLORIDE CRYS ER 20 MEQ PO TBCR: 20.0000 meq | EXTENDED_RELEASE_TABLET | Freq: Every day | ORAL | 3 refills | Status: DC

## 2022-06-17 NOTE — Telephone Encounter (Signed)
Pt c/o Shortness Of Breath: STAT if SOB developed within the last 24 hours or pt is noticeably SOB on the phone  1. Are you currently SOB (can you hear that pt is SOB on the phone)? yes  2. How long have you been experiencing SOB? "Forever"   3. Are you SOB when sitting or when up moving around? When moving around  4. Are you currently experiencing any other symptoms? Swelling as well in her right leg.

## 2022-06-17 NOTE — Telephone Encounter (Addendum)
As per Loma Sousa / Triage Nurse, Pt must go to UC or ED immediately.  NP is OOO on Mondays.

## 2022-06-17 NOTE — Telephone Encounter (Signed)
Spoke with patient who states she has had increased SOB and lower extremity edema. She has not been weighing her self. She increased her lasix starting Friday 3/15 and has been taking 40 mg daily since with some improvement. She is not on a potassium supplement.   Reviewed with Dr Burt Knack. Advised patient to continue on Lasix 40 mg daily and add potassium 20 mEq daily. Let us know in a few days how she is doing and if there is improvement or not. She will come in for lab work on 07/02/22.   Patient verbalized understanding and had no questions.

## 2022-06-18 NOTE — Telephone Encounter (Signed)
My apologies... Pt has been experiencing SOB.  Telephone Advice Record / Access Nurse uploaded to Media tab following call from Pt.

## 2022-06-18 NOTE — Telephone Encounter (Signed)
Left message to return phone call.

## 2022-06-18 NOTE — Telephone Encounter (Signed)
Noted  

## 2022-06-18 NOTE — Telephone Encounter (Signed)
Spoke to pt and she stated she called Cardio and they increased the lasix to 40 everyday now. Pt also stated that they added potassium pill. Pt will follow up in 2 weeks with Cardio.

## 2022-06-18 NOTE — Telephone Encounter (Signed)
Called pt to see the reason for being Triaged. Looks like pt did not go to ED or UC unless outside of area. Will call pt again later.

## 2022-06-18 NOTE — Telephone Encounter (Signed)
According to triage note, pt experiencing SOB and edema. Per pt and note she was advised to increase lasix from 20mg  to 40mg  every other day. Pt has not has any relief and now experiencing SOB.Pt advised to go to ED or UC.

## 2022-07-02 ENCOUNTER — Ambulatory Visit: Payer: Medicare Other | Attending: Cardiovascular Disease

## 2022-07-02 DIAGNOSIS — I5032 Chronic diastolic (congestive) heart failure: Secondary | ICD-10-CM | POA: Diagnosis not present

## 2022-07-03 ENCOUNTER — Telehealth: Payer: Self-pay | Admitting: *Deleted

## 2022-07-03 DIAGNOSIS — I5032 Chronic diastolic (congestive) heart failure: Secondary | ICD-10-CM

## 2022-07-03 LAB — BASIC METABOLIC PANEL
BUN/Creatinine Ratio: 24 (ref 12–28)
BUN: 52 mg/dL — ABNORMAL HIGH (ref 10–36)
CO2: 19 mmol/L — ABNORMAL LOW (ref 20–29)
Calcium: 9.2 mg/dL (ref 8.7–10.3)
Chloride: 104 mmol/L (ref 96–106)
Creatinine, Ser: 2.16 mg/dL — ABNORMAL HIGH (ref 0.57–1.00)
Glucose: 118 mg/dL — ABNORMAL HIGH (ref 70–99)
Potassium: 5.3 mmol/L — ABNORMAL HIGH (ref 3.5–5.2)
Sodium: 142 mmol/L (ref 134–144)
eGFR: 20 mL/min/{1.73_m2} — ABNORMAL LOW (ref >59)

## 2022-07-03 NOTE — Telephone Encounter (Signed)
Patient is returning call. Transferred to [place], Therapist, sports.

## 2022-07-03 NOTE — Telephone Encounter (Signed)
I spoke w the patient.  She voices understanding of plan to stop potassium and stay on lasix 40 daily.  She said she is in the bathroom "all the time".  Her breathing is "considerably better" and her swelling goes way down overnight each night and returns as the day goes on.  I asked her to stay on the 40 mg daily for now, and if Dr. Burt Knack has other recommendations we will call her back.   Will come in for repeat BMET next Wednesday.

## 2022-07-03 NOTE — Telephone Encounter (Signed)
Reviewed with Dr. Marlou Porch - Creatinine elevated w the increase of lasix to 40 mg daily and potassium is elevated w adding potassium 20 meq on 06/17/22.   Per Dr. Marlou Porch, have patient stop potassium and continue on the 40 mg Lasix daily and return for blood work in 1 week.  I called and left a message for the patient to call back.

## 2022-07-08 DIAGNOSIS — H0015 Chalazion left lower eyelid: Secondary | ICD-10-CM | POA: Diagnosis not present

## 2022-07-10 ENCOUNTER — Ambulatory Visit: Payer: Medicare Other | Attending: Cardiovascular Disease

## 2022-07-10 DIAGNOSIS — I5032 Chronic diastolic (congestive) heart failure: Secondary | ICD-10-CM | POA: Diagnosis not present

## 2022-07-11 LAB — BASIC METABOLIC PANEL
BUN/Creatinine Ratio: 31 — ABNORMAL HIGH (ref 12–28)
BUN: 58 mg/dL — ABNORMAL HIGH (ref 10–36)
CO2: 21 mmol/L (ref 20–29)
Calcium: 9 mg/dL (ref 8.7–10.3)
Chloride: 104 mmol/L (ref 96–106)
Creatinine, Ser: 1.89 mg/dL — ABNORMAL HIGH (ref 0.57–1.00)
Glucose: 93 mg/dL (ref 70–99)
Potassium: 4.3 mmol/L (ref 3.5–5.2)
Sodium: 143 mmol/L (ref 134–144)
eGFR: 24 mL/min/{1.73_m2} — ABNORMAL LOW (ref >59)

## 2022-07-15 NOTE — Progress Notes (Signed)
[facility] [address] [place] [address] Phone: [phone] Subjective:   Sara Garza, am serving as a scribe for Dr. Antoine Garza.  I'm seeing this patient by the request  of:  Sara Frees, NP  CC: right shoulder pain   [id number]  06/11/2022 Repeat aspiration occurred again today.  Discussed potentially going towards Toradol in the next 1 to spread out the steroid if necessary.  Discussed with patient again about icing regimen, compression.  Patient understands SDOH is her age and not a surgical appropriate  RTC in 5-6 weeks   Updated 07/16/2022 Sara Garza is a 89 y.o. female coming in with complaint of R shoulder pain. Patient states that the steroid given last visit helps more than the toradol injection. Does feel like       Past Medical History:  Diagnosis Date   Arthritis    CAD (coronary artery disease)    Carotid stenosis, left    50-60% stable   Diabetes mellitus    Glaucoma    Hyperlipidemia    Hypertension    Leg pain    ABIs 2/18: normal bilaterally.   Osteoporosis    Valvular heart disease    Aortic Stenosis s/p [name] 2014 // Mitral stenosis // Echo 09/2018: EF > 65, mod LVH, severe focal basal hypertrophy, RVSP 39.8, mild to mod MR, mod to severe MS (mean 9), AVR with mild AI, severe LAE (similar to prior echo)   Past Surgical History:  Procedure Laterality Date   ANOMALOUS PULMONARY VENOUS RETURN REPAIR, TOTAL     APPENDECTOMY     CARDIAC VALVE REPLACEMENT  04/29/2012   Cataracts Bilateral    CESAREAN SECTION     FOOT SURGERY     Sara Garza   HIP ARTHROPLASTY Right 07/31/2013   Procedure: ARTHROPLASTY BIPOLAR HIP;  Surgeon: Shelda Pal, MD;  Location: [facility];  Service: Orthopedics;  Laterality: Right;   INTRAMEDULLARY (IM) NAIL INTERTROCHANTERIC Left 01/01/2020   Procedure: INTRAMEDULLARY (IM) NAIL INTERTROCHANTRIC;  Surgeon: Samson Frederic, MD;  Location: [facility];  Service: Orthopedics;  Laterality:  Left;   PERCUTANEOUS CORONARY STENT INTERVENTION (PCI-S) N/A 01/02/2012   Procedure: PERCUTANEOUS CORONARY STENT INTERVENTION (PCI-S);  Surgeon: Tonny Bollman, MD;  Location: [facility] CATH LAB;  Service: Cardiovascular;  Laterality: N/A;   Social History   Socioeconomic History   Marital status: Widowed    Spouse name: Not on file   Number of children: Not on file   Years of education: Not on file   Highest education level: Not on file  Occupational History   Not on file  Tobacco Use   Smoking status: Never    Passive exposure: Yes   Smokeless tobacco: Never  Substance and Sexual Activity   Alcohol use: Yes    Alcohol/week: 0.0 standard drinks of alcohol    Comment: very seldom   Drug use: No   Sexual activity: Not Currently  Other Topics Concern   Not on file  Social History Narrative   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      She likes to read and she takes classes at [employer] of [employer]: Low Risk  (05/22/2022)   Overall Financial Resource Strain (CARDIA)    Difficulty of Paying Living Expenses: Not hard at all  Food Insecurity: No Food Insecurity (05/22/2022)   Hunger Vital Sign  Worried About Programme researcher, broadcasting/film/video in the Last Year: Never true    Ran Out of Food in the Last Year: Never true  Transportation Needs: No Transportation Needs (05/22/2022)   PRAPARE - Administrator, Civil Service (Medical): No    Lack of Transportation (Non-Medical): No  Physical Activity: Inactive (05/22/2022)   Exercise Vital Sign    Days of Exercise per Week: 0 days    Minutes of Exercise per Session: 0 min  Stress: No Stress Concern Present (05/22/2022)   [employer] of Occupational Health - Occupational Stress Questionnaire    Feeling of Stress : Not at all  Social Connections: Moderately Integrated (05/22/2022)   Social Connection and Isolation Panel [NHANES]    Frequency of  Communication with Friends and Family: More than three times a week    Frequency of Social Gatherings with Friends and Family: More than three times a week    Attends Religious Services: More than 4 times per year    Active Member of [employer] or Organizations: Yes    Attends Banker Meetings: More than 4 times per year    Marital Status: Widowed   Allergies  Allergen Reactions   Statins Other (See Comments)    Muscle aches   Gabapentin Other (See Comments)    SOMNOLENCE   Family History  Problem Relation Age of Onset   COPD Father    Cancer Father        lung cancer   Lung cancer Other      Current Outpatient Medications (Cardiovascular):    amLODipine (NORVASC) 5 MG tablet, TAKE 1 TABLET(5 MG) BY MOUTH DAILY   carvedilol (COREG) 3.125 MG tablet, TAKE 1 TABLET(3.125 MG) BY MOUTH TWICE DAILY   ezetimibe (ZETIA) 10 MG tablet, TAKE 1 TABLET BY MOUTH DAILY   furosemide (LASIX) 20 MG tablet, Take 2 tablets (40 mg total) by mouth daily.   Current Outpatient Medications (Analgesics):    acetaminophen (TYLENOL) 500 MG tablet, Take 2 tablets (1,000 mg total) by mouth 4 (four) times daily.   aspirin EC 81 MG tablet, Take 81 mg by mouth daily. Swallow whole.   Current Outpatient Medications (Other):    Calcium Carb-Cholecalciferol 500-400 MG-UNIT TABS, Take 1 tablet by mouth daily.   hydrocortisone 2.5 % cream, Apply topically daily at 6 (six) AM. 2 weeks   Multiple Vitamin (MULTIVITAMIN) tablet, Take 1 tablet by mouth daily.   Multiple Vitamins-Minerals (PRESERVISION AREDS 2+MULTI VIT PO), Take 2 capsules by mouth in the morning and at bedtime.   potassium chloride SA (KLOR-CON M20) 20 MEQ tablet, Take 1 tablet (20 mEq total) by mouth daily.   Reviewed prior external information including notes and imaging from  primary care provider As well as notes that were available from care everywhere and other healthcare systems.  Past medical history, social, surgical and family  history all reviewed in electronic medical record.  No pertanent information unless stated regarding to the chief complaint.   Review of Systems:  No headache, visual changes, nausea, vomiting, diarrhea, constipation, dizziness, abdominal pain, skin rash, fevers, chills, night sweats, weight loss, swollen lymph nodes, body aches, joint swelling, chest pain, shortness of breath, mood changes. POSITIVE muscle aches, body aches, joint swelling  Objective  Blood pressure 116/64, pulse 77, height 4\' 11"  (1.499 m), weight 132 lb (59.9 kg), SpO2 95 %.   General: No apparent distress alert and oriented x3 mood and affect normal, dressed appropriately.  HEENT: Pupils equal, extraocular  movements intact  Severely antalgic gait walking with the aid of a walker Right shoulder does have significant arthritic changes noted and swelling.  Tender to palpation.  Procedure: Real-time Ultrasound Guided Injection of right glenohumeral joint Device: GE Logiq Q7  Ultrasound guided injection is preferred based studies that show increased duration, increased effect, greater accuracy, decreased procedural pain, increased response rate with ultrasound guided versus blind injection.  Verbal informed consent obtained.  Time-out conducted.  Noted no overlying erythema, induration, or other signs of local infection.  Skin prepped in a sterile fashion.  Local anesthesia: Topical Ethyl chloride.  With sterile technique and under real time ultrasound guidance:  Joint visualized.  From the anterior approach with an 18-gauge 1-1/2 inch needle patient was injected with 1 cc of 0.5% Marcaine and then aspirated 90 cc of dark kidney synovial fluid.  Then injected with 1 cc of Kenalog 40 mg/mL Pain immediately resolved suggesting accurate placement of the medication.  Advised to call if fevers/chills, erythema, induration, drainage, or persistent bleeding.  Impression: Technically successful ultrasound guided injection.     Impression and Recommendations:    The above documentation has been reviewed and is accurate and complete Judi Saa, DO

## 2022-07-16 ENCOUNTER — Encounter: Payer: Self-pay | Admitting: Family Medicine

## 2022-07-16 ENCOUNTER — Ambulatory Visit (INDEPENDENT_AMBULATORY_CARE_PROVIDER_SITE_OTHER): Payer: Medicare Other | Admitting: Family Medicine

## 2022-07-16 ENCOUNTER — Other Ambulatory Visit: Payer: Self-pay

## 2022-07-16 VITALS — BP 116/64 | HR 77 | Ht 59.0 in | Wt 132.0 lb

## 2022-07-16 DIAGNOSIS — M75101 Unspecified rotator cuff tear or rupture of right shoulder, not specified as traumatic: Secondary | ICD-10-CM | POA: Diagnosis not present

## 2022-07-16 DIAGNOSIS — M25511 Pain in right shoulder: Secondary | ICD-10-CM | POA: Diagnosis not present

## 2022-07-16 DIAGNOSIS — M12811 Other specific arthropathies, not elsewhere classified, right shoulder: Secondary | ICD-10-CM

## 2022-07-16 NOTE — Patient Instructions (Signed)
Drained shoulder today See me again in 7-8 weeks

## 2022-07-16 NOTE — Assessment & Plan Note (Signed)
Repeat aspiration done again today.  Discussed icing regimen and home exercises, discussed which activities to do and which ones to avoid.  Discussed with patient again secondary to her age she is not likely a surgical candidate and patient would not want surgery.  Unfortunately continues to have the aspiration necessary.  We discussed potentially having a drain placed with patient also declined.  Follow-up again in 5 to 6 weeks

## 2022-07-18 ENCOUNTER — Ambulatory Visit: Payer: Medicare Other | Admitting: Cardiovascular Disease

## 2022-07-30 DIAGNOSIS — H349 Unspecified retinal vascular occlusion: Secondary | ICD-10-CM | POA: Diagnosis not present

## 2022-07-30 DIAGNOSIS — Z961 Presence of intraocular lens: Secondary | ICD-10-CM | POA: Diagnosis not present

## 2022-07-30 DIAGNOSIS — E119 Type 2 diabetes mellitus without complications: Secondary | ICD-10-CM | POA: Diagnosis not present

## 2022-07-30 DIAGNOSIS — H353132 Nonexudative age-related macular degeneration, bilateral, intermediate dry stage: Secondary | ICD-10-CM | POA: Diagnosis not present

## 2022-07-30 DIAGNOSIS — H52203 Unspecified astigmatism, bilateral: Secondary | ICD-10-CM | POA: Diagnosis not present

## 2022-07-30 LAB — HM DIABETES EYE EXAM

## 2022-07-31 ENCOUNTER — Telehealth: Payer: Self-pay | Admitting: Adult Health

## 2022-07-31 NOTE — Telephone Encounter (Signed)
Dr. Charlotte Sanes at [facility]  Cell Phone:  [phone]  MD is asking for a call back from NP, regarding this patient.

## 2022-07-31 NOTE — Telephone Encounter (Signed)
Called and spoke to Dr. Charlotte Sanes at [facility] . 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   Hyperlipidemia is not controlled with zetia and she is not tolerant to statins.   Due to age will not likely be aggressive and Dr. Charlotte Sanes will see her back in 6 months

## 2022-09-02 NOTE — Progress Notes (Unsigned)
[name] [address] [place] [address] Phone: [phone] Subjective:   [name], am serving as a scribe for Dr. Antoine Primas.  I'm seeing this patient by the request  of:  Shirline Frees, NP  CC: Right shoulder pain  [id number]  07/16/2022 Repeat aspiration done again today. Discussed icing regimen and home exercises, discussed which activities to do and which ones to avoid. Discussed with patient again secondary to her age she is not likely a surgical candidate and patient would not want surgery. Unfortunately continues to have the aspiration necessary. We discussed potentially having a drain placed with patient also declined. Follow-up again in 5 to 6 weeks   Updated 09/03/2022 HARGUN FIECHTNER is a 89 y.o. female coming in with complaint of shoulder pain. Patient's pain started to come back this week.       Past Medical History:  Diagnosis Date   Arthritis    CAD (coronary artery disease)    Carotid stenosis, left    50-60% stable   Diabetes mellitus    Glaucoma    Hyperlipidemia    Hypertension    Leg pain    ABIs 2/18: normal bilaterally.   Osteoporosis    Valvular heart disease    Aortic Stenosis s/p [name] 2014 // Mitral stenosis // Echo 09/2018: EF > 65, mod LVH, severe focal basal hypertrophy, RVSP 39.8, mild to mod MR, mod to severe MS (mean 9), AVR with mild AI, severe LAE (similar to prior echo)   Past Surgical History:  Procedure Laterality Date   ANOMALOUS PULMONARY VENOUS RETURN REPAIR, TOTAL     APPENDECTOMY     CARDIAC VALVE REPLACEMENT  04/29/2012   Cataracts Bilateral    CESAREAN SECTION     FOOT SURGERY     [name]   HIP ARTHROPLASTY Right 07/31/2013   Procedure: ARTHROPLASTY BIPOLAR HIP;  Surgeon: Shelda Pal, MD;  Location: [facility];  Service: Orthopedics;  Laterality: Right;   INTRAMEDULLARY (IM) NAIL INTERTROCHANTERIC Left 01/01/2020   Procedure: INTRAMEDULLARY (IM) NAIL INTERTROCHANTRIC;  Surgeon:  Samson Frederic, MD;  Location: [facility];  Service: Orthopedics;  Laterality: Left;   PERCUTANEOUS CORONARY STENT INTERVENTION (PCI-S) N/A 01/02/2012   Procedure: PERCUTANEOUS CORONARY STENT INTERVENTION (PCI-S);  Surgeon: Tonny Bollman, MD;  Location: [facility] CATH LAB;  Service: Cardiovascular;  Laterality: N/A;   Social History   Socioeconomic History   Marital status: Widowed    Spouse name: Not on file   Number of children: Not on file   Years of education: Not on file   Highest education level: Not on file  Occupational History   Not on file  Tobacco Use   Smoking status: Never    Passive exposure: Yes   Smokeless tobacco: Never  Substance and Sexual Activity   Alcohol use: Yes    Alcohol/week: 0.0 standard drinks of alcohol    Comment: very seldom   Drug use: No   Sexual activity: Not Currently  Other Topics Concern   Not on file  Social History Narrative   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      She likes to read and she takes classes at [employer]: Low Risk  (05/22/2022)   Overall Financial Resource Strain (CARDIA)    Difficulty of Paying Living Expenses: Not hard at all  Food Insecurity: No  Food Insecurity (05/22/2022)   Hunger Vital Sign    Worried About Running Out of Food in the Last Year: Never true    Ran Out of Food in the Last Year: Never true  Transportation Needs: No Transportation Needs (05/22/2022)   PRAPARE - Administrator, Civil Service (Medical): No    Lack of Transportation (Non-Medical): No  Physical Activity: Inactive (05/22/2022)   Exercise Vital Sign    Days of Exercise per Week: 0 days    Minutes of Exercise per Session: 0 min  Stress: No Stress Concern Present (05/22/2022)   [employer] of Occupational Health - Occupational Stress Questionnaire    Feeling of Stress : Not at all  Social Connections: Moderately Integrated (05/22/2022)    Social Connection and Isolation Panel [NHANES]    Frequency of Communication with Friends and Family: More than three times a week    Frequency of Social Gatherings with Friends and Family: More than three times a week    Attends Religious Services: More than 4 times per year    Active Member of [employer] or Organizations: Yes    Attends Banker Meetings: More than 4 times per year    Marital Status: Widowed   Allergies  Allergen Reactions   Statins Other (See Comments)    Muscle aches   Gabapentin Other (See Comments)    SOMNOLENCE   Family History  Problem Relation Age of Onset   COPD Father    Cancer Father        lung cancer   Lung cancer Other      Current Outpatient Medications (Cardiovascular):    amLODipine (NORVASC) 5 MG tablet, TAKE 1 TABLET(5 MG) BY MOUTH DAILY   carvedilol (COREG) 3.125 MG tablet, TAKE 1 TABLET(3.125 MG) BY MOUTH TWICE DAILY   ezetimibe (ZETIA) 10 MG tablet, TAKE 1 TABLET BY MOUTH DAILY   furosemide (LASIX) 20 MG tablet, Take 2 tablets (40 mg total) by mouth daily.   Current Outpatient Medications (Analgesics):    acetaminophen (TYLENOL) 500 MG tablet, Take 2 tablets (1,000 mg total) by mouth 4 (four) times daily.   aspirin EC 81 MG tablet, Take 81 mg by mouth daily. Swallow whole.   Current Outpatient Medications (Other):    Calcium Carb-Cholecalciferol 500-400 MG-UNIT TABS, Take 1 tablet by mouth daily.   hydrocortisone 2.5 % cream, Apply topically daily at 6 (six) AM. 2 weeks   Multiple Vitamin (MULTIVITAMIN) tablet, Take 1 tablet by mouth daily.   Multiple Vitamins-Minerals (PRESERVISION AREDS 2+MULTI VIT PO), Take 2 capsules by mouth in the morning and at bedtime.   potassium chloride SA (KLOR-CON M20) 20 MEQ tablet, Take 1 tablet (20 mEq total) by mouth daily.   Reviewed prior external information including notes and imaging from  primary care provider As well as notes that were available from care everywhere and other  healthcare systems.  Past medical history, social, surgical and family history all reviewed in electronic medical record.  No pertanent information unless stated regarding to the chief complaint.   Review of Systems:  No headache, visual changes, nausea, vomiting, diarrhea, constipation, dizziness, abdominal pain, skin rash, fevers, chills, night sweats, weight loss, swollen lymph nodes, body aches, joint swelling, chest pain, shortness of breath, mood changes. POSITIVE muscle aches  Objective  There were no vitals taken for this visit.   General: No apparent distress alert and oriented x3 mood and affect normal, dressed appropriately.  HEENT: Pupils equal, extraocular movements intact  Antalgic gait noted. Right shoulder does have significant swelling noted, significant crepitus noted.  Procedure: Real-time Ultrasound Guided Injection of right glenohumeral joint Device: GE Logiq Q7  Ultrasound guided injection is preferred based studies that show increased duration, increased effect, greater accuracy, decreased procedural pain, increased response rate with ultrasound guided versus blind injection.  Verbal informed consent obtained.  Time-out conducted.  Noted no overlying erythema, induration, or other signs of local infection.  Skin prepped in a sterile fashion.  Local anesthesia: Topical Ethyl chloride.  With sterile technique and under real time ultrasound guidance:  Joint visualized.  23g 1  inch needle inserted posterior approach. Pictures taken for needle placement. Patient did have injection of  2 cc of 0.5% Marcaine, aspirated 99 cc of blood-tinged fluid then injected  1.0 cc of Kenalog 40 mg/dL. Completed without difficulty  Pain immediately resolved suggesting accurate placement of the medication.  Advised to call if fevers/chills, erythema, induration, drainage, or persistent bleeding.  Impression: Technically successful ultrasound guided injection.    Impression and  Recommendations:    The above documentation has been reviewed and is accurate and complete Judi Saa, DO

## 2022-09-03 ENCOUNTER — Ambulatory Visit (INDEPENDENT_AMBULATORY_CARE_PROVIDER_SITE_OTHER): Payer: Medicare Other | Admitting: Family Medicine

## 2022-09-03 ENCOUNTER — Encounter: Payer: Self-pay | Admitting: Family Medicine

## 2022-09-03 ENCOUNTER — Other Ambulatory Visit: Payer: Self-pay

## 2022-09-03 VITALS — BP 122/66 | HR 100 | Ht 59.0 in

## 2022-09-03 DIAGNOSIS — M75101 Unspecified rotator cuff tear or rupture of right shoulder, not specified as traumatic: Secondary | ICD-10-CM | POA: Diagnosis not present

## 2022-09-03 DIAGNOSIS — M25511 Pain in right shoulder: Secondary | ICD-10-CM

## 2022-09-03 DIAGNOSIS — M12811 Other specific arthropathies, not elsewhere classified, right shoulder: Secondary | ICD-10-CM | POA: Diagnosis not present

## 2022-09-03 NOTE — Patient Instructions (Addendum)
Drained shoulder today See me again in 6 weeks 

## 2022-09-03 NOTE — Assessment & Plan Note (Signed)
Repeat aspiration again today.  Tolerated the procedure well but continues to have the swelling.  We discussed which activities to do and which ones to avoid.  Patient will follow-up again in 6 weeks.  Chronic problem with worsening symptoms.  Due to comorbidities and social determinant of health and decreasing physical activity she is not a surgical candidate follow-up again in 6 weeks

## 2022-09-11 ENCOUNTER — Telehealth: Payer: Self-pay | Admitting: Family Medicine

## 2022-09-11 NOTE — Telephone Encounter (Signed)
Spoke with patient. Advise that if symptoms get worse, ie. Redness, swelling, streaking, hot, etc she should seek medical attention immediately. At this time doesn't want to go to urgent care. Will try thermotherapy tonight and will call back tomorrow with update

## 2022-09-11 NOTE — Telephone Encounter (Signed)
Patient called stating that she is having a lot of pain in her right arm and shoulder. She said that the pain is bad, she can hardly lift her arm.  She asked if there was something she could do or if this was temporary or what might be going on?

## 2022-10-02 ENCOUNTER — Telehealth: Payer: Self-pay | Admitting: Adult Health

## 2022-10-02 NOTE — Telephone Encounter (Addendum)
Pt called to request a referral for in home physical therapy.  Pt states her legs are very weak, she has to use a walker, and her legs are not strong enough to hold her up.  Please advise.

## 2022-10-04 ENCOUNTER — Other Ambulatory Visit: Payer: Self-pay

## 2022-10-04 DIAGNOSIS — I25119 Atherosclerotic heart disease of native coronary artery with unspecified angina pectoris: Secondary | ICD-10-CM

## 2022-10-04 DIAGNOSIS — I10 Essential (primary) hypertension: Secondary | ICD-10-CM

## 2022-10-04 DIAGNOSIS — I359 Nonrheumatic aortic valve disorder, unspecified: Secondary | ICD-10-CM

## 2022-10-04 MED ORDER — EZETIMIBE 10 MG PO TABS
10.0000 mg | ORAL_TABLET | Freq: Every day | ORAL | 3 refills | Status: DC
Start: 2022-10-04 — End: 2023-04-23

## 2022-10-04 NOTE — Telephone Encounter (Signed)
Left voice message at 10:34 am on 10/04/22 for Pt to call back to schedule a VV

## 2022-10-04 NOTE — Telephone Encounter (Signed)
Tried to call pt no answer. Pt needs an appt. Please schedule pt if she returns call.

## 2022-10-04 NOTE — Telephone Encounter (Signed)
Noted  

## 2022-10-04 NOTE — Telephone Encounter (Signed)
Pt stated she cannot walk, and her legs are not strong enough to hold her up.  Can she do a VV?

## 2022-10-04 NOTE — Telephone Encounter (Signed)
VV is okay to do

## 2022-10-15 ENCOUNTER — Ambulatory Visit (INDEPENDENT_AMBULATORY_CARE_PROVIDER_SITE_OTHER): Payer: Medicare Other | Admitting: Adult Health

## 2022-10-15 ENCOUNTER — Encounter: Payer: Self-pay | Admitting: Adult Health

## 2022-10-15 ENCOUNTER — Ambulatory Visit: Payer: Medicare Other | Admitting: Family Medicine

## 2022-10-15 VITALS — BP 120/80 | HR 94 | Temp 98.0°F | Ht 59.0 in | Wt 133.0 lb

## 2022-10-15 DIAGNOSIS — R2681 Unsteadiness on feet: Secondary | ICD-10-CM | POA: Diagnosis not present

## 2022-10-15 NOTE — Progress Notes (Signed)
Subjective:    Patient ID: Sara Garza, female    DOB: 23-Jun-1923, 89 y.o.   MRN: [id number]  HPI 89 year old female who  has a past medical history of Arthritis, CAD (coronary artery disease), Carotid stenosis, left, Diabetes mellitus, Glaucoma, Hyperlipidemia, Hypertension, Leg pain, Osteoporosis, and Valvular heart disease.  She presents to the office today for gait instability. She feels as though her legs are very weak, she is using walker and when she does she feels as though her legs are not strong enough to hold her up. She does report that she had a mechanical fall at home a few weeks ago, she tripped on a rug and fell onto her bottom. She did not injure herself. She had to wait for her health care aid to come over and pick her up. She would like to get stronger in her legs.    Review of Systems See HPI   Past Medical History:  Diagnosis Date   Arthritis    CAD (coronary artery disease)    Carotid stenosis, left    50-60% stable   Diabetes mellitus    Glaucoma    Hyperlipidemia    Hypertension    Leg pain    ABIs 2/18: normal bilaterally.   Osteoporosis    Valvular heart disease    Aortic Stenosis s/p [name] 2014 // Mitral stenosis // Echo 09/2018: EF > 65, mod LVH, severe focal basal hypertrophy, RVSP 39.8, mild to mod MR, mod to severe MS (mean 9), AVR with mild AI, severe LAE (similar to prior echo)    Social History   Socioeconomic History   Marital status: Widowed    Spouse name: Not on file   Number of children: Not on file   Years of education: Not on file   Highest education level: Not on file  Occupational History   Not on file  Tobacco Use   Smoking status: Never    Passive exposure: Yes   Smokeless tobacco: Never  Substance and Sexual Activity   Alcohol use: Yes    Alcohol/week: 0.0 standard drinks of alcohol    Comment: very seldom   Drug use: No   Sexual activity: Not Currently  Other Topics Concern   Not on file  Social History Narrative    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      She likes to read and she takes classes at [employer]: Low Risk  (05/22/2022)   Overall Financial Resource Strain (CARDIA)    Difficulty of Paying Living Expenses: Not hard at all  Food Insecurity: No Food Insecurity (05/22/2022)   Hunger Vital Sign    Worried About Running Out of Food in the Last Year: Never true    Ran Out of Food in the Last Year: Never true  Transportation Needs: No Transportation Needs (05/22/2022)   PRAPARE - Administrator, Civil Service (Medical): No    Lack of Transportation (Non-Medical): No  Physical Activity: Inactive (05/22/2022)   Exercise Vital Sign    Days of Exercise per Week: 0 days    Minutes of Exercise per Session: 0 min  Stress: No Stress Concern Present (05/22/2022)   [employer] of Occupational Health - Occupational Stress Questionnaire    Feeling of Stress : Not at all  Social Connections: Moderately Integrated (05/22/2022)   Social Connection  and Isolation Panel [NHANES]    Frequency of Communication with Friends and Family: More than three times a week    Frequency of Social Gatherings with Friends and Family: More than three times a week    Attends Religious Services: More than 4 times per year    Active Member of [employer] or Organizations: Yes    Attends Banker Meetings: More than 4 times per year    Marital Status: Widowed  Intimate Partner Violence: Not At Risk (05/22/2022)   Humiliation, Afraid, Rape, and Kick questionnaire    Fear of Current or Ex-Partner: No    Emotionally Abused: No    Physically Abused: No    Sexually Abused: No    Past Surgical History:  Procedure Laterality Date   ANOMALOUS PULMONARY VENOUS RETURN REPAIR, TOTAL     APPENDECTOMY     CARDIAC VALVE REPLACEMENT  04/29/2012   Cataracts Bilateral    CESAREAN SECTION     FOOT SURGERY     [name]    HIP ARTHROPLASTY Right 07/31/2013   Procedure: ARTHROPLASTY BIPOLAR HIP;  Surgeon: Shelda Pal, MD;  Location: [facility];  Service: Orthopedics;  Laterality: Right;   INTRAMEDULLARY (IM) NAIL INTERTROCHANTERIC Left 01/01/2020   Procedure: INTRAMEDULLARY (IM) NAIL INTERTROCHANTRIC;  Surgeon: Samson Frederic, MD;  Location: [facility];  Service: Orthopedics;  Laterality: Left;   PERCUTANEOUS CORONARY STENT INTERVENTION (PCI-S) N/A 01/02/2012   Procedure: PERCUTANEOUS CORONARY STENT INTERVENTION (PCI-S);  Surgeon: Tonny Bollman, MD;  Location: [facility] CATH LAB;  Service: Cardiovascular;  Laterality: N/A;    Family History  Problem Relation Age of Onset   COPD Father    Cancer Father        lung cancer   Lung cancer Other     Allergies  Allergen Reactions   Statins Other (See Comments)    Muscle aches   Gabapentin Other (See Comments)    SOMNOLENCE    Current Outpatient Medications on File Prior to Visit  Medication Sig Dispense Refill   acetaminophen (TYLENOL) 500 MG tablet Take 2 tablets (1,000 mg total) by mouth 4 (four) times daily. 30 tablet 0   amLODipine (NORVASC) 5 MG tablet TAKE 1 TABLET(5 MG) BY MOUTH DAILY 90 tablet 2   aspirin EC 81 MG tablet Take 81 mg by mouth daily. Swallow whole.     Calcium Carb-Cholecalciferol 500-400 MG-UNIT TABS Take 1 tablet by mouth daily.     carvedilol (COREG) 3.125 MG tablet TAKE 1 TABLET(3.125 MG) BY MOUTH TWICE DAILY 180 tablet 2   ezetimibe (ZETIA) 10 MG tablet Take 1 tablet (10 mg total) by mouth daily. 90 tablet 3   furosemide (LASIX) 20 MG tablet Take 2 tablets (40 mg total) by mouth daily. 180 tablet 3   Multiple Vitamin (MULTIVITAMIN) tablet Take 1 tablet by mouth daily.     Multiple Vitamins-Minerals (PRESERVISION AREDS 2+MULTI VIT PO) Take 2 capsules by mouth in the morning and at bedtime.     No current facility-administered medications on file prior to visit.    BP 120/80   Pulse 94   Temp 98 F (36.7 C) (Oral)   Ht 4\' 11"  (1.499 m)    Wt 133 lb (60.3 kg)   SpO2 96%   BMI 26.86 kg/m       Objective:   Physical Exam Vitals and nursing note reviewed.  Constitutional:      Appearance: Normal appearance.  Cardiovascular:     Rate and Rhythm: Normal rate and regular rhythm.  Pulses: Normal pulses.     Heart sounds: Normal heart sounds.  Pulmonary:     Effort: Pulmonary effort is normal.     Breath sounds: Normal breath sounds.  Skin:    General: Skin is warm and dry.     Capillary Refill: Capillary refill takes less than 2 seconds.  Neurological:     General: No focal deficit present.     Mental Status: She is alert and oriented to person, place, and time.     Motor: Weakness present.     Gait: Gait abnormal.  Psychiatric:        Mood and Affect: Mood normal.        Behavior: Behavior normal.        Thought Content: Thought content normal.        Judgment: Judgment normal.        Assessment & Plan:  1. Gait instability - Will refer to home health PT - Falls precautions reviewed - Ambulatory referral to Home Health  Shirline Frees, NP

## 2022-10-16 NOTE — Progress Notes (Unsigned)
[facility] [address] [place] [address] Phone: [phone] Subjective:   Sara Garza, am serving as a scribe for Dr. Antoine Primas.  I'm seeing this patient by the request  of:  Shirline Frees, NP  CC: Right shoulder pain follow-up with  [id number]  09/03/2022 Repeat aspiration again today. Tolerated the procedure well but continues to have the swelling. We discussed which activities to do and which ones to avoid. Patient will follow-up again in 6 weeks. Chronic problem with worsening symptoms. Due to comorbidities and social determinant of health and decreasing physical activity she is not a surgical candidate follow-up again in 6 weeks   Updated 10/23/2022 Sara Garza is a 89 y.o. female coming in with complaint of R shoulder pain. Patient states that her shoulder has been painful. Patient is doing in-home PT that started today.        Past Medical History:  Diagnosis Date   Arthritis    CAD (coronary artery disease)    Carotid stenosis, left    50-60% stable   Diabetes mellitus    Glaucoma    Hyperlipidemia    Hypertension    Leg pain    ABIs 2/18: normal bilaterally.   Osteoporosis    Valvular heart disease    Aortic Stenosis s/p [name] 2014 // Mitral stenosis // Echo 09/2018: EF > 65, mod LVH, severe focal basal hypertrophy, RVSP 39.8, mild to mod MR, mod to severe MS (mean 9), AVR with mild AI, severe LAE (similar to prior echo)   Past Surgical History:  Procedure Laterality Date   ANOMALOUS PULMONARY VENOUS RETURN REPAIR, TOTAL     APPENDECTOMY     CARDIAC VALVE REPLACEMENT  04/29/2012   Cataracts Bilateral    CESAREAN SECTION     FOOT SURGERY     [name]   HIP ARTHROPLASTY Right 07/31/2013   Procedure: ARTHROPLASTY BIPOLAR HIP;  Surgeon: Shelda Pal, MD;  Location: [facility];  Service: Orthopedics;  Laterality: Right;   INTRAMEDULLARY (IM) NAIL INTERTROCHANTERIC Left 01/01/2020   Procedure: INTRAMEDULLARY (IM) NAIL  INTERTROCHANTRIC;  Surgeon: Samson Frederic, MD;  Location: [facility];  Service: Orthopedics;  Laterality: Left;   PERCUTANEOUS CORONARY STENT INTERVENTION (PCI-S) N/A 01/02/2012   Procedure: PERCUTANEOUS CORONARY STENT INTERVENTION (PCI-S);  Surgeon: Tonny Bollman, MD;  Location: [facility] CATH [facility];  Service: Cardiovascular;  Laterality: N/A;   Social History   Socioeconomic History   Marital status: Widowed    Spouse name: Not on file   Number of children: Not on file   Years of education: Not on file   Highest education level: Not on file  Occupational History   Not on file  Tobacco Use   Smoking status: Never    Passive exposure: Yes   Smokeless tobacco: Never  Substance and Sexual Activity   Alcohol use: Yes    Alcohol/week: 0.0 standard drinks of alcohol    Comment: very seldom   Drug use: No   Sexual activity: Not Currently  Other Topics Concern   Not on file  Social History Narrative   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      She likes to read and she takes classes at [employer]: Low Risk  (05/22/2022)   Overall Financial Resource Strain (CARDIA)    Difficulty of Paying Living Expenses: Not hard at  all  Food Insecurity: No Food Insecurity (05/22/2022)   Hunger Vital Sign    Worried About Running Out of Food in the Last Year: Never true    Ran Out of Food in the Last Year: Never true  Transportation Needs: No Transportation Needs (05/22/2022)   PRAPARE - Administrator, Civil Service (Medical): No    Lack of Transportation (Non-Medical): No  Physical Activity: Inactive (05/22/2022)   Exercise Vital Sign    Days of Exercise per Week: 0 days    Minutes of Exercise per Session: 0 min  Stress: No Stress Concern Present (05/22/2022)   [employer] of Occupational Health - Occupational Stress Questionnaire    Feeling of Stress : Not at all  Social Connections:  Moderately Integrated (05/22/2022)   Social Connection and Isolation Panel [NHANES]    Frequency of Communication with Friends and Family: More than three times a week    Frequency of Social Gatherings with Friends and Family: More than three times a week    Attends Religious Services: More than 4 times per year    Active Member of [employer] or Organizations: Yes    Attends Banker Meetings: More than 4 times per year    Marital Status: Widowed   Allergies  Allergen Reactions   Statins Other (See Comments)    Muscle aches   Gabapentin Other (See Comments)    SOMNOLENCE   Family History  Problem Relation Age of Onset   COPD Father    Cancer Father        lung cancer   Lung cancer Other      Current Outpatient Medications (Cardiovascular):    amLODipine (NORVASC) 5 MG tablet, TAKE 1 TABLET(5 MG) BY MOUTH DAILY   carvedilol (COREG) 3.125 MG tablet, TAKE 1 TABLET(3.125 MG) BY MOUTH TWICE DAILY   ezetimibe (ZETIA) 10 MG tablet, Take 1 tablet (10 mg total) by mouth daily.   furosemide (LASIX) 20 MG tablet, Take 2 tablets (40 mg total) by mouth daily.   Current Outpatient Medications (Analgesics):    acetaminophen (TYLENOL) 500 MG tablet, Take 2 tablets (1,000 mg total) by mouth 4 (four) times daily.   aspirin EC 81 MG tablet, Take 81 mg by mouth daily. Swallow whole.   Current Outpatient Medications (Other):    Calcium Carb-Cholecalciferol 500-400 MG-UNIT TABS, Take 1 tablet by mouth daily.   Multiple Vitamin (MULTIVITAMIN) tablet, Take 1 tablet by mouth daily.   Multiple Vitamins-Minerals (PRESERVISION AREDS 2+MULTI VIT PO), Take 2 capsules by mouth in the morning and at bedtime.   Reviewed prior external information including notes and imaging from  primary care provider As well as notes that were available from care everywhere and other healthcare systems.  Past medical history, social, surgical and family history all reviewed in electronic medical record.  No  pertanent information unless stated regarding to the chief complaint.   Review of Systems:  No headache, visual changes, nausea, vomiting, diarrhea, constipation, dizziness, abdominal pain, skin rash, fevers, chills, night sweats, weight loss, swollen lymph nodes, body aches, joint swelling, chest pain, shortness of breath, mood changes. POSITIVE muscle aches  Objective  Blood pressure 118/74, height 4\' 11"  (1.499 m), weight 132 lb (59.9 kg).   General: No apparent distress alert and oriented x3 mood and affect normal, dressed appropriately.  HEENT: Pupils equal, extraocular movements intact  Respiratory: Patient's speak in full sentences and does not appear short of breath  Cardiovascular: No lower extremity edema, non tender,  no erythema  Antalgic gait noted, using the aid of a cane. Right shoulder exam does have significant swelling noted.  No audible popping with any type of movement.  Procedure: Real-time Ultrasound Guided Injection of right glenohumeral joint Device: GE Logiq Q7  Ultrasound guided injection is preferred based studies that show increased duration, increased effect, greater accuracy, decreased procedural pain, increased response rate with ultrasound guided versus blind injection.  Verbal informed consent obtained.  Time-out conducted.  Noted no overlying erythema, induration, or other signs of local infection.  Skin prepped in a sterile fashion.  Local anesthesia: Topical Ethyl chloride.  With sterile technique and under real time ultrasound guidance:  Joint visualized.  23g 1  inch needle inserted posterior approach. Pictures taken for needle placement. Patient did have injection of 2 cc of 1% lidocaine, 2 cc of 0.5% Marcaine, and aspirated 92 cc of blood-tinged fluid then injected 1.0 cc of Kenalog 40 mg/dL. Completed without difficulty  Pain immediately improved suggesting accurate placement of the medication.  Advised to call if fevers/chills, erythema, induration,  drainage, or persistent bleeding.  Impression: Technically successful ultrasound guided injection.    Impression and Recommendations:     The above documentation has been reviewed and is accurate and complete Judi Saa, DO

## 2022-10-18 ENCOUNTER — Telehealth: Payer: Self-pay | Admitting: Adult Health

## 2022-10-18 DIAGNOSIS — M19011 Primary osteoarthritis, right shoulder: Secondary | ICD-10-CM | POA: Diagnosis not present

## 2022-10-18 DIAGNOSIS — Z604 Social exclusion and rejection: Secondary | ICD-10-CM | POA: Diagnosis not present

## 2022-10-18 DIAGNOSIS — E785 Hyperlipidemia, unspecified: Secondary | ICD-10-CM | POA: Diagnosis not present

## 2022-10-18 DIAGNOSIS — N183 Chronic kidney disease, stage 3 unspecified: Secondary | ICD-10-CM | POA: Diagnosis not present

## 2022-10-18 DIAGNOSIS — I2510 Atherosclerotic heart disease of native coronary artery without angina pectoris: Secondary | ICD-10-CM | POA: Diagnosis not present

## 2022-10-18 DIAGNOSIS — E1142 Type 2 diabetes mellitus with diabetic polyneuropathy: Secondary | ICD-10-CM | POA: Diagnosis not present

## 2022-10-18 DIAGNOSIS — I050 Rheumatic mitral stenosis: Secondary | ICD-10-CM | POA: Diagnosis not present

## 2022-10-18 DIAGNOSIS — I6522 Occlusion and stenosis of left carotid artery: Secondary | ICD-10-CM | POA: Diagnosis not present

## 2022-10-18 DIAGNOSIS — G8929 Other chronic pain: Secondary | ICD-10-CM | POA: Diagnosis not present

## 2022-10-18 DIAGNOSIS — I5033 Acute on chronic diastolic (congestive) heart failure: Secondary | ICD-10-CM | POA: Diagnosis not present

## 2022-10-18 DIAGNOSIS — E1122 Type 2 diabetes mellitus with diabetic chronic kidney disease: Secondary | ICD-10-CM | POA: Diagnosis not present

## 2022-10-18 DIAGNOSIS — Z952 Presence of prosthetic heart valve: Secondary | ICD-10-CM | POA: Diagnosis not present

## 2022-10-18 DIAGNOSIS — H409 Unspecified glaucoma: Secondary | ICD-10-CM | POA: Diagnosis not present

## 2022-10-18 DIAGNOSIS — Z7982 Long term (current) use of aspirin: Secondary | ICD-10-CM | POA: Diagnosis not present

## 2022-10-18 DIAGNOSIS — Z9181 History of falling: Secondary | ICD-10-CM | POA: Diagnosis not present

## 2022-10-18 DIAGNOSIS — I130 Hypertensive heart and chronic kidney disease with heart failure and stage 1 through stage 4 chronic kidney disease, or unspecified chronic kidney disease: Secondary | ICD-10-CM | POA: Diagnosis not present

## 2022-10-18 NOTE — Telephone Encounter (Signed)
Home health PT 2x4/1x4 for balance and strength training. Can leave voicemail

## 2022-10-22 NOTE — Telephone Encounter (Signed)
Verbal orders given to  [facility]

## 2022-10-22 NOTE — Telephone Encounter (Signed)
Okay for verbal orders? Please advise 

## 2022-10-23 ENCOUNTER — Ambulatory Visit: Payer: Medicare Other | Admitting: Family Medicine

## 2022-10-23 ENCOUNTER — Other Ambulatory Visit: Payer: Self-pay

## 2022-10-23 VITALS — BP 118/74 | Ht 59.0 in | Wt 132.0 lb

## 2022-10-23 DIAGNOSIS — M12811 Other specific arthropathies, not elsewhere classified, right shoulder: Secondary | ICD-10-CM

## 2022-10-23 DIAGNOSIS — E1142 Type 2 diabetes mellitus with diabetic polyneuropathy: Secondary | ICD-10-CM | POA: Diagnosis not present

## 2022-10-23 DIAGNOSIS — E1122 Type 2 diabetes mellitus with diabetic chronic kidney disease: Secondary | ICD-10-CM | POA: Diagnosis not present

## 2022-10-23 DIAGNOSIS — M19011 Primary osteoarthritis, right shoulder: Secondary | ICD-10-CM | POA: Diagnosis not present

## 2022-10-23 DIAGNOSIS — M75101 Unspecified rotator cuff tear or rupture of right shoulder, not specified as traumatic: Secondary | ICD-10-CM | POA: Diagnosis not present

## 2022-10-23 DIAGNOSIS — I130 Hypertensive heart and chronic kidney disease with heart failure and stage 1 through stage 4 chronic kidney disease, or unspecified chronic kidney disease: Secondary | ICD-10-CM | POA: Diagnosis not present

## 2022-10-23 DIAGNOSIS — M25511 Pain in right shoulder: Secondary | ICD-10-CM

## 2022-10-23 DIAGNOSIS — N183 Chronic kidney disease, stage 3 unspecified: Secondary | ICD-10-CM | POA: Diagnosis not present

## 2022-10-23 DIAGNOSIS — I5033 Acute on chronic diastolic (congestive) heart failure: Secondary | ICD-10-CM | POA: Diagnosis not present

## 2022-10-23 NOTE — Patient Instructions (Addendum)
See me again in 7 weeks 

## 2022-10-24 ENCOUNTER — Encounter: Payer: Self-pay | Admitting: Family Medicine

## 2022-10-24 LAB — SYNOVIAL FLUID ANALYSIS, COMPLETE
Basophils, %: 0 %
Eosinophils-Synovial: 0 % (ref 0–2)
Monocyte/Macrophage: 47 % (ref 0–69)
Synoviocytes, %: 8 % (ref 0–15)

## 2022-10-24 NOTE — Assessment & Plan Note (Signed)
Repeat aspiration given again today.  Steroid injection given as well.  Does help out significantly.  Did aspirate nearly 100 cc of fluid.  Will check if there is any sign of any infectious etiology with the been a little darker.  Follow-up with me otherwise again 6 to 12 weeks due to patient's age and comorbidities she is not a surgical candidate.

## 2022-10-25 DIAGNOSIS — I5033 Acute on chronic diastolic (congestive) heart failure: Secondary | ICD-10-CM | POA: Diagnosis not present

## 2022-10-25 DIAGNOSIS — E1142 Type 2 diabetes mellitus with diabetic polyneuropathy: Secondary | ICD-10-CM | POA: Diagnosis not present

## 2022-10-25 DIAGNOSIS — I130 Hypertensive heart and chronic kidney disease with heart failure and stage 1 through stage 4 chronic kidney disease, or unspecified chronic kidney disease: Secondary | ICD-10-CM | POA: Diagnosis not present

## 2022-10-25 DIAGNOSIS — E1122 Type 2 diabetes mellitus with diabetic chronic kidney disease: Secondary | ICD-10-CM | POA: Diagnosis not present

## 2022-10-25 DIAGNOSIS — N183 Chronic kidney disease, stage 3 unspecified: Secondary | ICD-10-CM | POA: Diagnosis not present

## 2022-10-25 DIAGNOSIS — M19011 Primary osteoarthritis, right shoulder: Secondary | ICD-10-CM | POA: Diagnosis not present

## 2022-10-28 DIAGNOSIS — M19011 Primary osteoarthritis, right shoulder: Secondary | ICD-10-CM | POA: Diagnosis not present

## 2022-10-28 DIAGNOSIS — N183 Chronic kidney disease, stage 3 unspecified: Secondary | ICD-10-CM | POA: Diagnosis not present

## 2022-10-28 DIAGNOSIS — E1122 Type 2 diabetes mellitus with diabetic chronic kidney disease: Secondary | ICD-10-CM | POA: Diagnosis not present

## 2022-10-28 DIAGNOSIS — I5033 Acute on chronic diastolic (congestive) heart failure: Secondary | ICD-10-CM | POA: Diagnosis not present

## 2022-10-28 DIAGNOSIS — I130 Hypertensive heart and chronic kidney disease with heart failure and stage 1 through stage 4 chronic kidney disease, or unspecified chronic kidney disease: Secondary | ICD-10-CM | POA: Diagnosis not present

## 2022-10-28 DIAGNOSIS — E1142 Type 2 diabetes mellitus with diabetic polyneuropathy: Secondary | ICD-10-CM | POA: Diagnosis not present

## 2022-11-01 DIAGNOSIS — E1142 Type 2 diabetes mellitus with diabetic polyneuropathy: Secondary | ICD-10-CM | POA: Diagnosis not present

## 2022-11-01 DIAGNOSIS — I5033 Acute on chronic diastolic (congestive) heart failure: Secondary | ICD-10-CM | POA: Diagnosis not present

## 2022-11-01 DIAGNOSIS — I130 Hypertensive heart and chronic kidney disease with heart failure and stage 1 through stage 4 chronic kidney disease, or unspecified chronic kidney disease: Secondary | ICD-10-CM | POA: Diagnosis not present

## 2022-11-01 DIAGNOSIS — N183 Chronic kidney disease, stage 3 unspecified: Secondary | ICD-10-CM | POA: Diagnosis not present

## 2022-11-01 DIAGNOSIS — E1122 Type 2 diabetes mellitus with diabetic chronic kidney disease: Secondary | ICD-10-CM | POA: Diagnosis not present

## 2022-11-01 DIAGNOSIS — M19011 Primary osteoarthritis, right shoulder: Secondary | ICD-10-CM | POA: Diagnosis not present

## 2022-11-05 DIAGNOSIS — I130 Hypertensive heart and chronic kidney disease with heart failure and stage 1 through stage 4 chronic kidney disease, or unspecified chronic kidney disease: Secondary | ICD-10-CM | POA: Diagnosis not present

## 2022-11-05 DIAGNOSIS — E1122 Type 2 diabetes mellitus with diabetic chronic kidney disease: Secondary | ICD-10-CM | POA: Diagnosis not present

## 2022-11-05 DIAGNOSIS — M19011 Primary osteoarthritis, right shoulder: Secondary | ICD-10-CM | POA: Diagnosis not present

## 2022-11-05 DIAGNOSIS — N183 Chronic kidney disease, stage 3 unspecified: Secondary | ICD-10-CM | POA: Diagnosis not present

## 2022-11-05 DIAGNOSIS — E1142 Type 2 diabetes mellitus with diabetic polyneuropathy: Secondary | ICD-10-CM | POA: Diagnosis not present

## 2022-11-05 DIAGNOSIS — I5033 Acute on chronic diastolic (congestive) heart failure: Secondary | ICD-10-CM | POA: Diagnosis not present

## 2022-11-08 DIAGNOSIS — I130 Hypertensive heart and chronic kidney disease with heart failure and stage 1 through stage 4 chronic kidney disease, or unspecified chronic kidney disease: Secondary | ICD-10-CM | POA: Diagnosis not present

## 2022-11-08 DIAGNOSIS — I5033 Acute on chronic diastolic (congestive) heart failure: Secondary | ICD-10-CM | POA: Diagnosis not present

## 2022-11-08 DIAGNOSIS — E1122 Type 2 diabetes mellitus with diabetic chronic kidney disease: Secondary | ICD-10-CM | POA: Diagnosis not present

## 2022-11-08 DIAGNOSIS — E1142 Type 2 diabetes mellitus with diabetic polyneuropathy: Secondary | ICD-10-CM | POA: Diagnosis not present

## 2022-11-08 DIAGNOSIS — M19011 Primary osteoarthritis, right shoulder: Secondary | ICD-10-CM | POA: Diagnosis not present

## 2022-11-08 DIAGNOSIS — N183 Chronic kidney disease, stage 3 unspecified: Secondary | ICD-10-CM | POA: Diagnosis not present

## 2022-11-12 DIAGNOSIS — M19011 Primary osteoarthritis, right shoulder: Secondary | ICD-10-CM | POA: Diagnosis not present

## 2022-11-12 DIAGNOSIS — I5033 Acute on chronic diastolic (congestive) heart failure: Secondary | ICD-10-CM | POA: Diagnosis not present

## 2022-11-12 DIAGNOSIS — E1122 Type 2 diabetes mellitus with diabetic chronic kidney disease: Secondary | ICD-10-CM | POA: Diagnosis not present

## 2022-11-12 DIAGNOSIS — I130 Hypertensive heart and chronic kidney disease with heart failure and stage 1 through stage 4 chronic kidney disease, or unspecified chronic kidney disease: Secondary | ICD-10-CM | POA: Diagnosis not present

## 2022-11-12 DIAGNOSIS — E1142 Type 2 diabetes mellitus with diabetic polyneuropathy: Secondary | ICD-10-CM | POA: Diagnosis not present

## 2022-11-12 DIAGNOSIS — N183 Chronic kidney disease, stage 3 unspecified: Secondary | ICD-10-CM | POA: Diagnosis not present

## 2022-11-14 DIAGNOSIS — M19011 Primary osteoarthritis, right shoulder: Secondary | ICD-10-CM | POA: Diagnosis not present

## 2022-11-14 DIAGNOSIS — E1142 Type 2 diabetes mellitus with diabetic polyneuropathy: Secondary | ICD-10-CM | POA: Diagnosis not present

## 2022-11-14 DIAGNOSIS — N183 Chronic kidney disease, stage 3 unspecified: Secondary | ICD-10-CM | POA: Diagnosis not present

## 2022-11-14 DIAGNOSIS — I5033 Acute on chronic diastolic (congestive) heart failure: Secondary | ICD-10-CM | POA: Diagnosis not present

## 2022-11-14 DIAGNOSIS — E1122 Type 2 diabetes mellitus with diabetic chronic kidney disease: Secondary | ICD-10-CM | POA: Diagnosis not present

## 2022-11-14 DIAGNOSIS — I130 Hypertensive heart and chronic kidney disease with heart failure and stage 1 through stage 4 chronic kidney disease, or unspecified chronic kidney disease: Secondary | ICD-10-CM | POA: Diagnosis not present

## 2022-11-17 DIAGNOSIS — G8929 Other chronic pain: Secondary | ICD-10-CM | POA: Diagnosis not present

## 2022-11-17 DIAGNOSIS — Z604 Social exclusion and rejection: Secondary | ICD-10-CM | POA: Diagnosis not present

## 2022-11-17 DIAGNOSIS — E1122 Type 2 diabetes mellitus with diabetic chronic kidney disease: Secondary | ICD-10-CM | POA: Diagnosis not present

## 2022-11-17 DIAGNOSIS — H409 Unspecified glaucoma: Secondary | ICD-10-CM | POA: Diagnosis not present

## 2022-11-17 DIAGNOSIS — M19011 Primary osteoarthritis, right shoulder: Secondary | ICD-10-CM | POA: Diagnosis not present

## 2022-11-17 DIAGNOSIS — N183 Chronic kidney disease, stage 3 unspecified: Secondary | ICD-10-CM | POA: Diagnosis not present

## 2022-11-17 DIAGNOSIS — I5033 Acute on chronic diastolic (congestive) heart failure: Secondary | ICD-10-CM | POA: Diagnosis not present

## 2022-11-17 DIAGNOSIS — I6522 Occlusion and stenosis of left carotid artery: Secondary | ICD-10-CM | POA: Diagnosis not present

## 2022-11-17 DIAGNOSIS — Z9181 History of falling: Secondary | ICD-10-CM | POA: Diagnosis not present

## 2022-11-17 DIAGNOSIS — I050 Rheumatic mitral stenosis: Secondary | ICD-10-CM | POA: Diagnosis not present

## 2022-11-17 DIAGNOSIS — E1142 Type 2 diabetes mellitus with diabetic polyneuropathy: Secondary | ICD-10-CM | POA: Diagnosis not present

## 2022-11-17 DIAGNOSIS — Z7982 Long term (current) use of aspirin: Secondary | ICD-10-CM | POA: Diagnosis not present

## 2022-11-17 DIAGNOSIS — I2510 Atherosclerotic heart disease of native coronary artery without angina pectoris: Secondary | ICD-10-CM | POA: Diagnosis not present

## 2022-11-17 DIAGNOSIS — I130 Hypertensive heart and chronic kidney disease with heart failure and stage 1 through stage 4 chronic kidney disease, or unspecified chronic kidney disease: Secondary | ICD-10-CM | POA: Diagnosis not present

## 2022-11-17 DIAGNOSIS — E785 Hyperlipidemia, unspecified: Secondary | ICD-10-CM | POA: Diagnosis not present

## 2022-11-17 DIAGNOSIS — Z952 Presence of prosthetic heart valve: Secondary | ICD-10-CM | POA: Diagnosis not present

## 2022-11-18 DIAGNOSIS — E1142 Type 2 diabetes mellitus with diabetic polyneuropathy: Secondary | ICD-10-CM | POA: Diagnosis not present

## 2022-11-18 DIAGNOSIS — I130 Hypertensive heart and chronic kidney disease with heart failure and stage 1 through stage 4 chronic kidney disease, or unspecified chronic kidney disease: Secondary | ICD-10-CM | POA: Diagnosis not present

## 2022-11-18 DIAGNOSIS — M19011 Primary osteoarthritis, right shoulder: Secondary | ICD-10-CM | POA: Diagnosis not present

## 2022-11-18 DIAGNOSIS — I5033 Acute on chronic diastolic (congestive) heart failure: Secondary | ICD-10-CM | POA: Diagnosis not present

## 2022-11-18 DIAGNOSIS — N183 Chronic kidney disease, stage 3 unspecified: Secondary | ICD-10-CM | POA: Diagnosis not present

## 2022-11-18 DIAGNOSIS — E1122 Type 2 diabetes mellitus with diabetic chronic kidney disease: Secondary | ICD-10-CM | POA: Diagnosis not present

## 2022-11-26 ENCOUNTER — Telehealth: Payer: Self-pay | Admitting: Adult Health

## 2022-11-26 DIAGNOSIS — E1142 Type 2 diabetes mellitus with diabetic polyneuropathy: Secondary | ICD-10-CM | POA: Diagnosis not present

## 2022-11-26 DIAGNOSIS — E1122 Type 2 diabetes mellitus with diabetic chronic kidney disease: Secondary | ICD-10-CM | POA: Diagnosis not present

## 2022-11-26 DIAGNOSIS — N183 Chronic kidney disease, stage 3 unspecified: Secondary | ICD-10-CM | POA: Diagnosis not present

## 2022-11-26 DIAGNOSIS — M19011 Primary osteoarthritis, right shoulder: Secondary | ICD-10-CM | POA: Diagnosis not present

## 2022-11-26 DIAGNOSIS — I5033 Acute on chronic diastolic (congestive) heart failure: Secondary | ICD-10-CM | POA: Diagnosis not present

## 2022-11-26 DIAGNOSIS — I130 Hypertensive heart and chronic kidney disease with heart failure and stage 1 through stage 4 chronic kidney disease, or unspecified chronic kidney disease: Secondary | ICD-10-CM | POA: Diagnosis not present

## 2022-11-26 NOTE — Telephone Encounter (Signed)
Noted  

## 2022-11-26 NOTE — Telephone Encounter (Signed)
OT eval for patient

## 2022-11-26 NOTE — Telephone Encounter (Signed)
Okay for verbal orders? Please advise 

## 2022-11-27 ENCOUNTER — Other Ambulatory Visit: Payer: Self-pay | Admitting: Cardiovascular Disease

## 2022-11-27 NOTE — Telephone Encounter (Signed)
Post-[name] pt last seen in January by Sara Garza and has current scheduled appt.

## 2022-11-27 NOTE — Telephone Encounter (Signed)
Pt's pharmacy is requesting a refill on amoxicillin. This medication was D/C off of pt's medication list. Dr. Excell Seltzer has refilled this medication before for pt's dental procedures. Would Dr. Excell Seltzer like to reorder this medication? Please address

## 2022-11-28 NOTE — Telephone Encounter (Signed)
Left message to return phone call.

## 2022-11-28 NOTE — Telephone Encounter (Signed)
Verbal orders given to  [facility]

## 2022-12-03 DIAGNOSIS — N183 Chronic kidney disease, stage 3 unspecified: Secondary | ICD-10-CM | POA: Diagnosis not present

## 2022-12-03 DIAGNOSIS — I130 Hypertensive heart and chronic kidney disease with heart failure and stage 1 through stage 4 chronic kidney disease, or unspecified chronic kidney disease: Secondary | ICD-10-CM | POA: Diagnosis not present

## 2022-12-03 DIAGNOSIS — E1142 Type 2 diabetes mellitus with diabetic polyneuropathy: Secondary | ICD-10-CM | POA: Diagnosis not present

## 2022-12-03 DIAGNOSIS — M19011 Primary osteoarthritis, right shoulder: Secondary | ICD-10-CM | POA: Diagnosis not present

## 2022-12-03 DIAGNOSIS — I5033 Acute on chronic diastolic (congestive) heart failure: Secondary | ICD-10-CM | POA: Diagnosis not present

## 2022-12-03 DIAGNOSIS — E1122 Type 2 diabetes mellitus with diabetic chronic kidney disease: Secondary | ICD-10-CM | POA: Diagnosis not present

## 2022-12-03 NOTE — Progress Notes (Signed)
[facility] [address] [place] [address] Phone: [phone] Subjective:   Sara Garza, am serving as a scribe for Dr. Antoine Garza.  I'm seeing this patient by the request  of:  Sara Frees, NP  CC: Right shoulder pain  [id number]  10/23/2022 Repeat aspiration given again today. Steroid injection given as well. Does help out significantly. Did aspirate nearly 100 cc of fluid. Will check if there is any sign of any infectious etiology with the been a little darker. Follow-up with me otherwise again 6 to 12 weeks due to patient's age and comorbidities she is not a surgical candidate.   Updated 12/10/2022 Sara Garza is a 89 y.o. female coming in with complaint of shoulder pain.  Known rotator cuff arthropathy with recurrent swelling of the shoulder.  Patient states it does not feel quite as swollen as the worst that has been previously but is swollen.      Past Medical History:  Diagnosis Date   Arthritis    CAD (coronary artery disease)    Carotid stenosis, left    50-60% stable   Diabetes mellitus    Glaucoma    Hyperlipidemia    Hypertension    Leg pain    ABIs 2/18: normal bilaterally.   Osteoporosis    Valvular heart disease    Aortic Stenosis s/p [name] 2014 // Mitral stenosis // Echo 09/2018: EF > 65, mod LVH, severe focal basal hypertrophy, RVSP 39.8, mild to mod MR, mod to severe MS (mean 9), AVR with mild AI, severe LAE (similar to prior echo)   Past Surgical History:  Procedure Laterality Date   ANOMALOUS PULMONARY VENOUS RETURN REPAIR, TOTAL     APPENDECTOMY     CARDIAC VALVE REPLACEMENT  04/29/2012   Cataracts Bilateral    CESAREAN SECTION     FOOT SURGERY     Sara Garza   HIP ARTHROPLASTY Right 07/31/2013   Procedure: ARTHROPLASTY BIPOLAR HIP;  Surgeon: Sara Pal, MD;  Location: [facility];  Service: Orthopedics;  Laterality: Right;   INTRAMEDULLARY (IM) NAIL INTERTROCHANTERIC Left 01/01/2020   Procedure:  INTRAMEDULLARY (IM) NAIL INTERTROCHANTRIC;  Surgeon: Sara Frederic, MD;  Location: [facility];  Service: Orthopedics;  Laterality: Left;   PERCUTANEOUS CORONARY STENT INTERVENTION (PCI-S) N/A 01/02/2012   Procedure: PERCUTANEOUS CORONARY STENT INTERVENTION (PCI-S);  Surgeon: Sara Bollman, MD;  Location: [facility] CATH [facility];  Service: Cardiovascular;  Laterality: N/A;   Social History   Socioeconomic History   Marital status: Widowed    Spouse name: Not on file   Number of children: Not on file   Years of education: Not on file   Highest education level: Not on file  Occupational History   Not on file  Tobacco Use   Smoking status: Never    Passive exposure: Yes   Smokeless tobacco: Never  Substance and Sexual Activity   Alcohol use: Yes    Alcohol/week: 0.0 standard drinks of alcohol    Comment: very seldom   Drug use: No   Sexual activity: Not Currently  Other Topics Concern   Not on file  Social History Narrative   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      She likes to read and she takes classes at [employer]: Low Risk  (05/22/2022)   Overall Financial Resource Strain (CARDIA)  Difficulty of Paying Living Expenses: Not hard at all  Food Insecurity: No Food Insecurity (05/22/2022)   Hunger Vital Sign    Worried About Running Out of Food in the Last Year: Never true    Ran Out of Food in the Last Year: Never true  Transportation Needs: No Transportation Needs (05/22/2022)   PRAPARE - Administrator, Civil Service (Medical): No    Lack of Transportation (Non-Medical): No  Physical Activity: Inactive (05/22/2022)   Exercise Vital Sign    Days of Exercise per Week: 0 days    Minutes of Exercise per Session: 0 min  Stress: No Stress Concern Present (05/22/2022)   [facility]ara [facility] of Occupational Health - Occupational Stress Questionnaire    Feeling of Stress : Not at all   Social Connections: Moderately Integrated (05/22/2022)   Social Connection and Isolation Panel [NHANES]    Frequency of Communication with Friends and Family: More than three times a week    Frequency of Social Gatherings with Friends and Family: More than three times a week    Attends Religious Services: More than 4 times per year    Active Member of [employer] or Organizations: Yes    Attends Banker Meetings: More than 4 times per year    Marital Status: Widowed   Allergies  Allergen Reactions   Statins Other (See Comments)    Muscle aches   Gabapentin Other (See Comments)    SOMNOLENCE   Family History  Problem Relation Age of Onset   COPD Father    Cancer Father        lung cancer   Lung cancer Other      Current Outpatient Medications (Cardiovascular):    amLODipine (NORVASC) 5 MG tablet, TAKE 1 TABLET(5 MG) BY MOUTH DAILY   carvedilol (COREG) 3.125 MG tablet, TAKE 1 TABLET(3.125 MG) BY MOUTH TWICE DAILY   ezetimibe (ZETIA) 10 MG tablet, Take 1 tablet (10 mg total) by mouth daily.   furosemide (LASIX) 20 MG tablet, Take 2 tablets (40 mg total) by mouth daily.   Current Outpatient Medications (Analgesics):    acetaminophen (TYLENOL) 500 MG tablet, Take 2 tablets (1,000 mg total) by mouth 4 (four) times daily.   aspirin EC 81 MG tablet, Take 81 mg by mouth daily. Swallow whole.   Current Outpatient Medications (Other):    amoxicillin (AMOXIL) 500 MG capsule, TAKE 4 CAPSULES BY MOUTH 1 HOUR PRIOR TO DENTAL PROCEDURES   Calcium Carb-Cholecalciferol 500-400 MG-UNIT TABS, Take 1 tablet by mouth daily.   Multiple Vitamin (MULTIVITAMIN) tablet, Take 1 tablet by mouth daily.   Multiple Vitamins-Minerals (PRESERVISION AREDS 2+MULTI VIT PO), Take 2 capsules by mouth in the morning and at bedtime.   Reviewed prior external information including notes and imaging from  primary care provider As well as notes that were available from care everywhere and other healthcare  systems.  Past medical history, social, surgical and family history all reviewed in electronic medical record.  No pertanent information unless stated regarding to the chief complaint.   Review of Systems:  No headache, visual changes, nausea, vomiting, diarrhea, constipation, dizziness, abdominal pain, skin rash, fevers, chills, night sweats, weight loss, swollen lymph nodes, body aches, chest pain, shortness of breath, mood changes. POSITIVE muscle aches, joint swelling  Objective  Blood pressure 122/70, pulse 79, height 4\' 11"  (1.499 m), SpO2 94%.   General: No apparent distress alert and oriented x3 mood and affect normal, dressed appropriately.  HEENT: Pupils equal,  extraocular movements intact  Ambulates with the aid of a walker. Patient does have some tenderness to palpation noted diffusely over the shoulder and swelling felt.  Procedure: Real-time Ultrasound Guided Injection of right shoulder injection and aspiration Device: GE Logiq Q7 Ultrasound guided injection is preferred based studies that show increased duration, increased effect, greater accuracy, decreased procedural pain, increased response rate, and decreased cost with ultrasound guided versus blind injection.  Verbal informed consent obtained.  Time-out conducted.  Noted no overlying erythema, induration, or other signs of local infection.  Skin prepped in a sterile fashion.  Local anesthesia: Topical Ethyl chloride.  With sterile technique and under real time ultrasound guidance: With an 18-gauge 1-1/2 inch needle from an anterior approach patient did have an injection of 1 cc of 0.5% Marcaine and then aspirated 40 cc of blood-tinged fluid.  Then injected 1 cc of Kenalog 40 mg/mL. Completed without difficulty  Pain immediately improved suggesting accurate placement of the medication.  Advised to call if fevers/chills, erythema, induration, drainage, or persistent bleeding.  Impression: Technically successful ultrasound  guided injection.    Impression and Recommendations:    The above documentation has been reviewed and is accurate and complete Judi Saa, DO

## 2022-12-10 ENCOUNTER — Other Ambulatory Visit: Payer: Self-pay

## 2022-12-10 ENCOUNTER — Ambulatory Visit (INDEPENDENT_AMBULATORY_CARE_PROVIDER_SITE_OTHER): Payer: Medicare Other | Admitting: Family Medicine

## 2022-12-10 ENCOUNTER — Encounter: Payer: Self-pay | Admitting: Family Medicine

## 2022-12-10 VITALS — BP 122/70 | HR 79 | Ht 59.0 in

## 2022-12-10 DIAGNOSIS — M25511 Pain in right shoulder: Secondary | ICD-10-CM | POA: Diagnosis not present

## 2022-12-10 DIAGNOSIS — M12811 Other specific arthropathies, not elsewhere classified, right shoulder: Secondary | ICD-10-CM | POA: Diagnosis not present

## 2022-12-10 DIAGNOSIS — M75101 Unspecified rotator cuff tear or rupture of right shoulder, not specified as traumatic: Secondary | ICD-10-CM | POA: Diagnosis not present

## 2022-12-10 NOTE — Patient Instructions (Signed)
See me in 5-6 weeks 

## 2022-12-10 NOTE — Assessment & Plan Note (Signed)
Repeat aspiration of only 40 cc.  Some improvement from previous 1.  Home exercises.  Increase activity slowly.  Follow-up with me again in 6 to 8 weeks due to patient's age and comorbidities associated with palpation as well as social determinants of health patient is not a surgical candidate

## 2022-12-11 DIAGNOSIS — E1122 Type 2 diabetes mellitus with diabetic chronic kidney disease: Secondary | ICD-10-CM | POA: Diagnosis not present

## 2022-12-11 DIAGNOSIS — N183 Chronic kidney disease, stage 3 unspecified: Secondary | ICD-10-CM | POA: Diagnosis not present

## 2022-12-11 DIAGNOSIS — I5033 Acute on chronic diastolic (congestive) heart failure: Secondary | ICD-10-CM | POA: Diagnosis not present

## 2022-12-11 DIAGNOSIS — I130 Hypertensive heart and chronic kidney disease with heart failure and stage 1 through stage 4 chronic kidney disease, or unspecified chronic kidney disease: Secondary | ICD-10-CM | POA: Diagnosis not present

## 2022-12-11 DIAGNOSIS — E1142 Type 2 diabetes mellitus with diabetic polyneuropathy: Secondary | ICD-10-CM | POA: Diagnosis not present

## 2022-12-11 DIAGNOSIS — M19011 Primary osteoarthritis, right shoulder: Secondary | ICD-10-CM | POA: Diagnosis not present

## 2022-12-13 DIAGNOSIS — N183 Chronic kidney disease, stage 3 unspecified: Secondary | ICD-10-CM | POA: Diagnosis not present

## 2022-12-13 DIAGNOSIS — I130 Hypertensive heart and chronic kidney disease with heart failure and stage 1 through stage 4 chronic kidney disease, or unspecified chronic kidney disease: Secondary | ICD-10-CM | POA: Diagnosis not present

## 2022-12-13 DIAGNOSIS — E1142 Type 2 diabetes mellitus with diabetic polyneuropathy: Secondary | ICD-10-CM | POA: Diagnosis not present

## 2022-12-13 DIAGNOSIS — I5033 Acute on chronic diastolic (congestive) heart failure: Secondary | ICD-10-CM | POA: Diagnosis not present

## 2022-12-13 DIAGNOSIS — M19011 Primary osteoarthritis, right shoulder: Secondary | ICD-10-CM | POA: Diagnosis not present

## 2022-12-13 DIAGNOSIS — E1122 Type 2 diabetes mellitus with diabetic chronic kidney disease: Secondary | ICD-10-CM | POA: Diagnosis not present

## 2022-12-17 DIAGNOSIS — E1142 Type 2 diabetes mellitus with diabetic polyneuropathy: Secondary | ICD-10-CM | POA: Diagnosis not present

## 2022-12-17 DIAGNOSIS — I5032 Chronic diastolic (congestive) heart failure: Secondary | ICD-10-CM | POA: Diagnosis not present

## 2022-12-17 DIAGNOSIS — M47816 Spondylosis without myelopathy or radiculopathy, lumbar region: Secondary | ICD-10-CM | POA: Diagnosis not present

## 2022-12-17 DIAGNOSIS — M19011 Primary osteoarthritis, right shoulder: Secondary | ICD-10-CM | POA: Diagnosis not present

## 2022-12-17 DIAGNOSIS — Z952 Presence of prosthetic heart valve: Secondary | ICD-10-CM | POA: Diagnosis not present

## 2022-12-17 DIAGNOSIS — E1122 Type 2 diabetes mellitus with diabetic chronic kidney disease: Secondary | ICD-10-CM | POA: Diagnosis not present

## 2022-12-17 DIAGNOSIS — I050 Rheumatic mitral stenosis: Secondary | ICD-10-CM | POA: Diagnosis not present

## 2022-12-17 DIAGNOSIS — I6522 Occlusion and stenosis of left carotid artery: Secondary | ICD-10-CM | POA: Diagnosis not present

## 2022-12-17 DIAGNOSIS — H409 Unspecified glaucoma: Secondary | ICD-10-CM | POA: Diagnosis not present

## 2022-12-17 DIAGNOSIS — Z9181 History of falling: Secondary | ICD-10-CM | POA: Diagnosis not present

## 2022-12-17 DIAGNOSIS — N183 Chronic kidney disease, stage 3 unspecified: Secondary | ICD-10-CM | POA: Diagnosis not present

## 2022-12-17 DIAGNOSIS — Z604 Social exclusion and rejection: Secondary | ICD-10-CM | POA: Diagnosis not present

## 2022-12-17 DIAGNOSIS — E785 Hyperlipidemia, unspecified: Secondary | ICD-10-CM | POA: Diagnosis not present

## 2022-12-17 DIAGNOSIS — M810 Age-related osteoporosis without current pathological fracture: Secondary | ICD-10-CM | POA: Diagnosis not present

## 2022-12-17 DIAGNOSIS — I130 Hypertensive heart and chronic kidney disease with heart failure and stage 1 through stage 4 chronic kidney disease, or unspecified chronic kidney disease: Secondary | ICD-10-CM | POA: Diagnosis not present

## 2022-12-17 DIAGNOSIS — I2510 Atherosclerotic heart disease of native coronary artery without angina pectoris: Secondary | ICD-10-CM | POA: Diagnosis not present

## 2022-12-17 DIAGNOSIS — Z7982 Long term (current) use of aspirin: Secondary | ICD-10-CM | POA: Diagnosis not present

## 2022-12-23 DIAGNOSIS — I130 Hypertensive heart and chronic kidney disease with heart failure and stage 1 through stage 4 chronic kidney disease, or unspecified chronic kidney disease: Secondary | ICD-10-CM | POA: Diagnosis not present

## 2022-12-23 DIAGNOSIS — I5032 Chronic diastolic (congestive) heart failure: Secondary | ICD-10-CM | POA: Diagnosis not present

## 2022-12-23 DIAGNOSIS — E1142 Type 2 diabetes mellitus with diabetic polyneuropathy: Secondary | ICD-10-CM | POA: Diagnosis not present

## 2022-12-23 DIAGNOSIS — E1122 Type 2 diabetes mellitus with diabetic chronic kidney disease: Secondary | ICD-10-CM | POA: Diagnosis not present

## 2022-12-23 DIAGNOSIS — N183 Chronic kidney disease, stage 3 unspecified: Secondary | ICD-10-CM | POA: Diagnosis not present

## 2022-12-23 DIAGNOSIS — M19011 Primary osteoarthritis, right shoulder: Secondary | ICD-10-CM | POA: Diagnosis not present

## 2022-12-30 ENCOUNTER — Encounter: Payer: Self-pay | Admitting: Cardiovascular Disease

## 2022-12-30 ENCOUNTER — Ambulatory Visit: Payer: Medicare Other | Attending: Cardiovascular Disease | Admitting: Cardiovascular Disease

## 2022-12-30 VITALS — BP 128/62 | HR 85 | Ht 59.0 in | Wt 128.0 lb

## 2022-12-30 DIAGNOSIS — I359 Nonrheumatic aortic valve disorder, unspecified: Secondary | ICD-10-CM | POA: Insufficient documentation

## 2022-12-30 DIAGNOSIS — I5032 Chronic diastolic (congestive) heart failure: Secondary | ICD-10-CM | POA: Diagnosis not present

## 2022-12-30 DIAGNOSIS — I25119 Atherosclerotic heart disease of native coronary artery with unspecified angina pectoris: Secondary | ICD-10-CM | POA: Insufficient documentation

## 2022-12-30 DIAGNOSIS — N184 Chronic kidney disease, stage 4 (severe): Secondary | ICD-10-CM | POA: Diagnosis not present

## 2022-12-30 NOTE — Progress Notes (Signed)
Cardiology Office Note:    Date:  12/30/2022   ID:  Sara Garza, DOB December 06, 1923, MRN [id number]  PCP:  Shirline Frees, NP   [facility] Providers Cardiologist:  Tonny Bollman, MD     Referring MD: Shirline Frees, NP   Chief Complaint  Patient presents with   Shortness of Breath    History of Present Illness:    Sara Garza is a 89 y.o. female with a hx of  coronary artery disease, aortic stenosis, and chronic diastolic heart failure, presenting for follow-up evaluation.  She underwent [name] for treatment of severe symptomatic aortic stenosis in 2014. She was treated with a 23 mm Medtronic Corevalve through the moderate risk SurTAVI aortic stenosis trial. She also is followed for moderate coronary artery disease which was evaluated with pressure wire analysis of the RCA and LCx demonstrating no hemodynamic evidence of flow-obstruction. 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  In March 2024 she had some weight gain and progressive dyspnea and her furosemide was increased further to 40 mg daily.  The patient is here alone today.  She remains remarkably independent at age 54.  She has someone come over a few times per week to help her bathe and do meal preparation for her.  She really wants to keep her independence and does not want to move into assisted living.  She continues to be limited by exertional dyspnea.  She has had no recent chest pain, heart palpitations, orthopnea, or PND.  States that her leg swelling has been better than in the past.  She reports poor appetite and she has lost 10 pounds over the past year.  Past Medical History:  Diagnosis Date   Arthritis    CAD (coronary artery disease)    Carotid stenosis, left    50-60% stable   Diabetes mellitus    Glaucoma    Hyperlipidemia    Hypertension    Leg pain    ABIs 2/18: normal bilaterally.   Osteoporosis     Valvular heart disease    Aortic Stenosis s/p [name] 2014 // Mitral stenosis // Echo 09/2018: EF > 65, mod LVH, severe focal basal hypertrophy, RVSP 39.8, mild to mod MR, mod to severe MS (mean 9), AVR with mild AI, severe LAE (similar to prior echo)    Past Surgical History:  Procedure Laterality Date   ANOMALOUS PULMONARY VENOUS RETURN REPAIR, TOTAL     APPENDECTOMY     CARDIAC VALVE REPLACEMENT  04/29/2012   Cataracts Bilateral    CESAREAN SECTION     FOOT SURGERY     [name]   HIP ARTHROPLASTY Right 07/31/2013   Procedure: ARTHROPLASTY BIPOLAR HIP;  Surgeon: Shelda Pal, MD;  Location: [facility];  Service: Orthopedics;  Laterality: Right;   INTRAMEDULLARY (IM) NAIL INTERTROCHANTERIC Left 01/01/2020   Procedure: INTRAMEDULLARY (IM) NAIL INTERTROCHANTRIC;  Surgeon: Samson Frederic, MD;  Location: [facility];  Service: Orthopedics;  Laterality: Left;   PERCUTANEOUS CORONARY STENT INTERVENTION (PCI-S) N/A 01/02/2012   Procedure: PERCUTANEOUS CORONARY STENT INTERVENTION (PCI-S);  Surgeon: Tonny Bollman, MD;  Location: [facility] CATH LAB;  Service: Cardiovascular;  Laterality: N/A;    Current Medications: Current Meds  Medication Sig   acetaminophen (TYLENOL) 500 MG tablet Take 2 tablets (1,000 mg total) by mouth 4 (four) times daily.   amLODipine (NORVASC) 5 MG tablet TAKE 1 TABLET(5 MG) BY MOUTH DAILY  amoxicillin (AMOXIL) 500 MG capsule TAKE 4 CAPSULES BY MOUTH 1 HOUR PRIOR TO DENTAL PROCEDURES   aspirin EC 81 MG tablet Take 81 mg by mouth daily. Swallow whole.   Calcium Carb-Cholecalciferol 500-400 MG-UNIT TABS Take 1 tablet by mouth daily.   carvedilol (COREG) 3.125 MG tablet TAKE 1 TABLET(3.125 MG) BY MOUTH TWICE DAILY   ezetimibe (ZETIA) 10 MG tablet Take 1 tablet (10 mg total) by mouth daily.   furosemide (LASIX) 20 MG tablet Take 2 tablets (40 mg total) by mouth daily.   Multiple Vitamin (MULTIVITAMIN) tablet Take 1 tablet by mouth daily.   Multiple Vitamins-Minerals (PRESERVISION AREDS  2+MULTI VIT PO) Take 2 capsules by mouth in the morning and at bedtime.     Allergies:   Statins and Gabapentin   Social History   Socioeconomic History   Marital status: Widowed    Spouse name: Not on file   Number of children: Not on file   Years of education: Not on file   Highest education level: Not on file  Occupational History   Not on file  Tobacco Use   Smoking status: Never    Passive exposure: Yes   Smokeless tobacco: Never  Substance and Sexual Activity   Alcohol use: Yes    Alcohol/week: 0.0 standard drinks of alcohol    Comment: very seldom   Drug use: No   Sexual activity: Not Currently  Other Topics Concern   Not on file  Social History Narrative   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      She likes to read and she takes classes at [employer]: Low Risk  (05/22/2022)   Overall Financial Resource Strain (CARDIA)    Difficulty of Paying Living Expenses: Not hard at all  Food Insecurity: No Food Insecurity (05/22/2022)   Hunger Vital Sign    Worried About Running Out of Food in the Last Year: Never true    Ran Out of Food in the Last Year: Never true  Transportation Needs: No Transportation Needs (05/22/2022)   PRAPARE - Administrator, Civil Service (Medical): No    Lack of Transportation (Non-Medical): No  Physical Activity: Inactive (05/22/2022)   Exercise Vital Sign    Days of Exercise per Week: 0 days    Minutes of Exercise per Session: 0 min  Stress: No Stress Concern Present (05/22/2022)   [employer] of Occupational Health - Occupational Stress Questionnaire    Feeling of Stress : Not at all  Social Connections: Moderately Integrated (05/22/2022)   Social Connection and Isolation Panel [NHANES]    Frequency of Communication with Friends and Family: More than three times a week    Frequency of Social Gatherings with Friends and Family:  More than three times a week    Attends Religious Services: More than 4 times per year    Active Member of [employer] or Organizations: Yes    Attends Banker Meetings: More than 4 times per year    Marital Status: Widowed     Family History: The patient's family history includes COPD in her father; Cancer in her father; Lung cancer in an other family member.  ROS:   Please see the history of present illness.    All other systems reviewed and are negative.  EKGs/Labs/Other Studies Reviewed:    The following studies were reviewed  today: 2D echocardiogram January 2024: 1. Left ventricular ejection fraction, by estimation, is 55%. The left  ventricle has normal function. The left ventricle demonstrates regional  wall motion abnormalities with thinning and akinesis of the mid  inferoseptal wall. There is moderate  concentric left ventricular hypertrophy. Left ventricular diastolic  parameters are consistent with Grade I diastolic dysfunction (impaired  relaxation).   2. Right ventricular systolic function is normal. The right ventricular  size is normal. Tricuspid regurgitation signal is inadequate for assessing  PA pressure.   3. Left atrial size was severely dilated.   4. The mitral valve is degenerative. Severe mitral valve regurgitation.  Severe mitral stenosis. The mean mitral valve gradient is 10.0 mmHg, MVA  0.92 by VTI. Severe mitral annular calcification.   5. Bioprosthetic aortic valve s/p [name]. 23 mm Medtronic CoreValve THV.  Moderate peri-valvular leakage. Elevated mean gradient 18 mmHg (prior 12  mmHg) with dimensionless index 0.37.   6. The inferior vena cava is normal in size with <50% respiratory  variability, suggesting right atrial pressure of 8 mmHg.   EKG Interpretation Date/Time:  Monday December 30 2022 14:12:37 EDT Ventricular Rate:  92 PR Interval:  202 QRS Duration:  146 QT Interval:  426 QTC Calculation: 526 R Axis:   -12  Text  Interpretation: Sinus rhythm with Premature atrial complexes Left bundle branch block When compared with ECG of 14-Feb-2020 08:28, PREVIOUS ECG IS PRESENT Confirmed by Tonny Bollman [phone]) on 12/30/2022 2:26:16 PM    Recent Labs: 02/19/2022: ALT 12; Hemoglobin 12.4; Platelets 223.0; TSH 1.67 07/10/2022: BUN 58; Creatinine, Ser 1.89; Potassium 4.3; Sodium 143  Recent Lipid Panel    Component Value Date/Time   CHOL 209 (H) 02/19/2022 1034   TRIG 220.0 (H) 02/19/2022 1034   HDL 44.90 02/19/2022 1034   CHOLHDL 5 02/19/2022 1034   VLDL 44.0 (H) 02/19/2022 1034   LDLCALC 133 (H) 12/01/2019 1014   LDLDIRECT 139.0 02/19/2022 1034     Risk Assessment/Calculations:                Physical Exam:    VS:  BP 128/62   Pulse 85   Ht 4\' 11"  (1.499 m)   Wt 128 lb (58.1 kg)   SpO2 96%   BMI 25.85 kg/m     Wt Readings from Last 3 Encounters:  12/30/22 128 lb (58.1 kg)  10/23/22 132 lb (59.9 kg)  10/15/22 133 lb (60.3 kg)     GEN:  Well nourished, well developed in no acute distress HEENT: Normal NECK: No JVD; No carotid bruits LYMPHATICS: No lymphadenopathy CARDIAC: RRR, 2/6 systolic murmur along left sternal border. RESPIRATORY:  Clear to auscultation without rales, wheezing or rhonchi  ABDOMEN: Soft, non-tender, non-distended MUSCULOSKELETAL: 1+ bilateral ankle edema; No deformity  SKIN: Warm and dry NEUROLOGIC:  Alert and oriented x 3 PSYCHIATRIC:  Normal affect   ASSESSMENT:    1. Chronic diastolic CHF (congestive heart failure) (HCC)   2. Coronary artery disease involving native coronary artery of native heart with angina pectoris (HCC)   3. Aortic valve disease   4. Chronic kidney disease, stage IV (severe) (HCC)    PLAN:    In order of problems listed above:  Patient appears clinically stable, still with significant limitation from shortness of breath but I suspect this is multifactorial at age 62.  Her leg edema is actually less than baseline and her weight is  down about 10 to 12 pounds over the past year.  She is  not having any orthopnea or PND.  Will continue her current management.  She is treated with a loop diuretic.  She is not a good candidate for SGLT2 inhibition because of urinary incontinence. Stable, no recent anginal symptoms on a combination of carvedilol and amlodipine. Patient stable status post [name] with moderate paravalvular regurgitation.  Continue clinical follow-up Most recent creatinine 1.9.  Continue current management.  Loop diuretic required to manage her heart failure symptoms.     Medication Adjustments/Labs and Tests Ordered: Current medicines are reviewed at length with the patient today.  Concerns regarding medicines are outlined above.  Orders Placed This Encounter  Procedures   EKG 12-Lead   No orders of the defined types were placed in this encounter.   Patient Instructions  Medication Instructions:  Your physician recommends that you continue on your current medications as directed. Please refer to the Current Medication list given to you today.  *If you need a refill on your cardiac medications before your next appointment, please call your pharmacy*   Lab Work: NONE If you have labs (blood work) drawn today and your tests are completely normal, you will receive your results only by: MyChart Message (if you have MyChart) OR A paper copy in the mail If you have any lab test that is abnormal or we need to change your treatment, we will call you to review the results.   Testing/Procedures: NONE   Follow-Up: At [facility], you and your health needs are our priority.  As part of our continuing mission to provide you with exceptional heart care, we have created designated Provider Care Teams.  These Care Teams include your primary Cardiologist (physician) and Advanced Practice Providers (APPs -  Physician Assistants and Nurse Practitioners) who all work together to provide you with the care you  need, when you need it.  We recommend signing up for the patient portal called "MyChart".  Sign up information is provided on this After Visit Summary.  MyChart is used to connect with patients for Virtual Visits (Telemedicine).  Patients are able to view lab/test results, encounter notes, upcoming appointments, etc.  Non-urgent messages can be sent to your provider as well.   To learn more about what you can do with MyChart, go to [email or web address].    Your next appointment:   6 month(s)  Provider:   Tonny Bollman, MD        Signed, Tonny Bollman, MD  12/30/2022 5:24 PM    [facility]

## 2022-12-30 NOTE — Patient Instructions (Signed)
Medication Instructions:  Your physician recommends that you continue on your current medications as directed. Please refer to the Current Medication list given to you today.  *If you need a refill on your cardiac medications before your next appointment, please call your pharmacy*   Lab Work: NONE If you have labs (blood work) drawn today and your tests are completely normal, you will receive your results only by: MyChart Message (if you have MyChart) OR A paper copy in the mail If you have any lab test that is abnormal or we need to change your treatment, we will call you to review the results.   Testing/Procedures: NONE   Follow-Up: At [facility], you and your health needs are our priority.  As part of our continuing mission to provide you with exceptional heart care, we have created designated Provider Care Teams.  These Care Teams include your primary Cardiologist (physician) and Advanced Practice Providers (APPs -  Physician Assistants and Nurse Practitioners) who all work together to provide you with the care you need, when you need it.  We recommend signing up for the patient portal called "MyChart".  Sign up information is provided on this After Visit Summary.  MyChart is used to connect with patients for Virtual Visits (Telemedicine).  Patients are able to view lab/test results, encounter notes, upcoming appointments, etc.  Non-urgent messages can be sent to your provider as well.   To learn more about what you can do with MyChart, go to [email or web address].    Your next appointment:   6 month(s)  Provider:   Michael Cooper, MD   

## 2022-12-31 DIAGNOSIS — M19011 Primary osteoarthritis, right shoulder: Secondary | ICD-10-CM | POA: Diagnosis not present

## 2022-12-31 DIAGNOSIS — I5032 Chronic diastolic (congestive) heart failure: Secondary | ICD-10-CM | POA: Diagnosis not present

## 2022-12-31 DIAGNOSIS — E1142 Type 2 diabetes mellitus with diabetic polyneuropathy: Secondary | ICD-10-CM | POA: Diagnosis not present

## 2022-12-31 DIAGNOSIS — I130 Hypertensive heart and chronic kidney disease with heart failure and stage 1 through stage 4 chronic kidney disease, or unspecified chronic kidney disease: Secondary | ICD-10-CM | POA: Diagnosis not present

## 2022-12-31 DIAGNOSIS — N183 Chronic kidney disease, stage 3 unspecified: Secondary | ICD-10-CM | POA: Diagnosis not present

## 2022-12-31 DIAGNOSIS — E1122 Type 2 diabetes mellitus with diabetic chronic kidney disease: Secondary | ICD-10-CM | POA: Diagnosis not present

## 2023-01-03 ENCOUNTER — Other Ambulatory Visit (HOSPITAL_BASED_OUTPATIENT_CLINIC_OR_DEPARTMENT_OTHER): Payer: Self-pay

## 2023-01-03 DIAGNOSIS — I130 Hypertensive heart and chronic kidney disease with heart failure and stage 1 through stage 4 chronic kidney disease, or unspecified chronic kidney disease: Secondary | ICD-10-CM | POA: Diagnosis not present

## 2023-01-03 DIAGNOSIS — N183 Chronic kidney disease, stage 3 unspecified: Secondary | ICD-10-CM | POA: Diagnosis not present

## 2023-01-03 DIAGNOSIS — E1122 Type 2 diabetes mellitus with diabetic chronic kidney disease: Secondary | ICD-10-CM | POA: Diagnosis not present

## 2023-01-03 DIAGNOSIS — M19011 Primary osteoarthritis, right shoulder: Secondary | ICD-10-CM | POA: Diagnosis not present

## 2023-01-03 DIAGNOSIS — I5032 Chronic diastolic (congestive) heart failure: Secondary | ICD-10-CM | POA: Diagnosis not present

## 2023-01-03 DIAGNOSIS — E1142 Type 2 diabetes mellitus with diabetic polyneuropathy: Secondary | ICD-10-CM | POA: Diagnosis not present

## 2023-01-03 DIAGNOSIS — Z23 Encounter for immunization: Secondary | ICD-10-CM | POA: Diagnosis not present

## 2023-01-03 MED ORDER — INFLUENZA VAC A&B SURF ANT ADJ 0.5 ML IM SUSY
0.5000 mL | PREFILLED_SYRINGE | Freq: Once | INTRAMUSCULAR | 0 refills | Status: AC
  Filled 2023-01-03: qty 0.5, 1d supply, fill #0

## 2023-01-07 DIAGNOSIS — N183 Chronic kidney disease, stage 3 unspecified: Secondary | ICD-10-CM | POA: Diagnosis not present

## 2023-01-07 DIAGNOSIS — I130 Hypertensive heart and chronic kidney disease with heart failure and stage 1 through stage 4 chronic kidney disease, or unspecified chronic kidney disease: Secondary | ICD-10-CM | POA: Diagnosis not present

## 2023-01-07 DIAGNOSIS — M19011 Primary osteoarthritis, right shoulder: Secondary | ICD-10-CM | POA: Diagnosis not present

## 2023-01-07 DIAGNOSIS — E1142 Type 2 diabetes mellitus with diabetic polyneuropathy: Secondary | ICD-10-CM | POA: Diagnosis not present

## 2023-01-07 DIAGNOSIS — I5032 Chronic diastolic (congestive) heart failure: Secondary | ICD-10-CM | POA: Diagnosis not present

## 2023-01-07 DIAGNOSIS — E1122 Type 2 diabetes mellitus with diabetic chronic kidney disease: Secondary | ICD-10-CM | POA: Diagnosis not present

## 2023-01-09 DIAGNOSIS — I5032 Chronic diastolic (congestive) heart failure: Secondary | ICD-10-CM | POA: Diagnosis not present

## 2023-01-09 DIAGNOSIS — N183 Chronic kidney disease, stage 3 unspecified: Secondary | ICD-10-CM | POA: Diagnosis not present

## 2023-01-09 DIAGNOSIS — M19011 Primary osteoarthritis, right shoulder: Secondary | ICD-10-CM | POA: Diagnosis not present

## 2023-01-09 DIAGNOSIS — E1122 Type 2 diabetes mellitus with diabetic chronic kidney disease: Secondary | ICD-10-CM | POA: Diagnosis not present

## 2023-01-09 DIAGNOSIS — E1142 Type 2 diabetes mellitus with diabetic polyneuropathy: Secondary | ICD-10-CM | POA: Diagnosis not present

## 2023-01-09 DIAGNOSIS — I130 Hypertensive heart and chronic kidney disease with heart failure and stage 1 through stage 4 chronic kidney disease, or unspecified chronic kidney disease: Secondary | ICD-10-CM | POA: Diagnosis not present

## 2023-01-14 DIAGNOSIS — I5032 Chronic diastolic (congestive) heart failure: Secondary | ICD-10-CM | POA: Diagnosis not present

## 2023-01-14 DIAGNOSIS — E1142 Type 2 diabetes mellitus with diabetic polyneuropathy: Secondary | ICD-10-CM | POA: Diagnosis not present

## 2023-01-14 DIAGNOSIS — I130 Hypertensive heart and chronic kidney disease with heart failure and stage 1 through stage 4 chronic kidney disease, or unspecified chronic kidney disease: Secondary | ICD-10-CM | POA: Diagnosis not present

## 2023-01-14 DIAGNOSIS — E1122 Type 2 diabetes mellitus with diabetic chronic kidney disease: Secondary | ICD-10-CM | POA: Diagnosis not present

## 2023-01-14 DIAGNOSIS — N183 Chronic kidney disease, stage 3 unspecified: Secondary | ICD-10-CM | POA: Diagnosis not present

## 2023-01-14 DIAGNOSIS — M19011 Primary osteoarthritis, right shoulder: Secondary | ICD-10-CM | POA: Diagnosis not present

## 2023-01-15 DIAGNOSIS — E1122 Type 2 diabetes mellitus with diabetic chronic kidney disease: Secondary | ICD-10-CM | POA: Diagnosis not present

## 2023-01-15 DIAGNOSIS — M19011 Primary osteoarthritis, right shoulder: Secondary | ICD-10-CM | POA: Diagnosis not present

## 2023-01-15 DIAGNOSIS — I130 Hypertensive heart and chronic kidney disease with heart failure and stage 1 through stage 4 chronic kidney disease, or unspecified chronic kidney disease: Secondary | ICD-10-CM | POA: Diagnosis not present

## 2023-01-15 DIAGNOSIS — E1142 Type 2 diabetes mellitus with diabetic polyneuropathy: Secondary | ICD-10-CM | POA: Diagnosis not present

## 2023-01-15 DIAGNOSIS — N183 Chronic kidney disease, stage 3 unspecified: Secondary | ICD-10-CM | POA: Diagnosis not present

## 2023-01-15 DIAGNOSIS — I5032 Chronic diastolic (congestive) heart failure: Secondary | ICD-10-CM | POA: Diagnosis not present

## 2023-01-16 DIAGNOSIS — M19011 Primary osteoarthritis, right shoulder: Secondary | ICD-10-CM | POA: Diagnosis not present

## 2023-01-16 DIAGNOSIS — Z952 Presence of prosthetic heart valve: Secondary | ICD-10-CM | POA: Diagnosis not present

## 2023-01-16 DIAGNOSIS — Z9181 History of falling: Secondary | ICD-10-CM | POA: Diagnosis not present

## 2023-01-16 DIAGNOSIS — Z7982 Long term (current) use of aspirin: Secondary | ICD-10-CM | POA: Diagnosis not present

## 2023-01-16 DIAGNOSIS — E1122 Type 2 diabetes mellitus with diabetic chronic kidney disease: Secondary | ICD-10-CM | POA: Diagnosis not present

## 2023-01-16 DIAGNOSIS — E1142 Type 2 diabetes mellitus with diabetic polyneuropathy: Secondary | ICD-10-CM | POA: Diagnosis not present

## 2023-01-16 DIAGNOSIS — M47816 Spondylosis without myelopathy or radiculopathy, lumbar region: Secondary | ICD-10-CM | POA: Diagnosis not present

## 2023-01-16 DIAGNOSIS — N183 Chronic kidney disease, stage 3 unspecified: Secondary | ICD-10-CM | POA: Diagnosis not present

## 2023-01-16 DIAGNOSIS — E785 Hyperlipidemia, unspecified: Secondary | ICD-10-CM | POA: Diagnosis not present

## 2023-01-16 DIAGNOSIS — Z604 Social exclusion and rejection: Secondary | ICD-10-CM | POA: Diagnosis not present

## 2023-01-16 DIAGNOSIS — I2510 Atherosclerotic heart disease of native coronary artery without angina pectoris: Secondary | ICD-10-CM | POA: Diagnosis not present

## 2023-01-16 DIAGNOSIS — I6522 Occlusion and stenosis of left carotid artery: Secondary | ICD-10-CM | POA: Diagnosis not present

## 2023-01-16 DIAGNOSIS — M810 Age-related osteoporosis without current pathological fracture: Secondary | ICD-10-CM | POA: Diagnosis not present

## 2023-01-16 DIAGNOSIS — I5032 Chronic diastolic (congestive) heart failure: Secondary | ICD-10-CM | POA: Diagnosis not present

## 2023-01-16 DIAGNOSIS — I050 Rheumatic mitral stenosis: Secondary | ICD-10-CM | POA: Diagnosis not present

## 2023-01-16 DIAGNOSIS — I130 Hypertensive heart and chronic kidney disease with heart failure and stage 1 through stage 4 chronic kidney disease, or unspecified chronic kidney disease: Secondary | ICD-10-CM | POA: Diagnosis not present

## 2023-01-16 DIAGNOSIS — H409 Unspecified glaucoma: Secondary | ICD-10-CM | POA: Diagnosis not present

## 2023-01-20 DIAGNOSIS — I5032 Chronic diastolic (congestive) heart failure: Secondary | ICD-10-CM | POA: Diagnosis not present

## 2023-01-20 DIAGNOSIS — E1122 Type 2 diabetes mellitus with diabetic chronic kidney disease: Secondary | ICD-10-CM | POA: Diagnosis not present

## 2023-01-20 DIAGNOSIS — E1142 Type 2 diabetes mellitus with diabetic polyneuropathy: Secondary | ICD-10-CM | POA: Diagnosis not present

## 2023-01-20 DIAGNOSIS — N183 Chronic kidney disease, stage 3 unspecified: Secondary | ICD-10-CM | POA: Diagnosis not present

## 2023-01-20 DIAGNOSIS — M19011 Primary osteoarthritis, right shoulder: Secondary | ICD-10-CM | POA: Diagnosis not present

## 2023-01-20 DIAGNOSIS — I130 Hypertensive heart and chronic kidney disease with heart failure and stage 1 through stage 4 chronic kidney disease, or unspecified chronic kidney disease: Secondary | ICD-10-CM | POA: Diagnosis not present

## 2023-01-20 NOTE — Progress Notes (Unsigned)
[name] [address] [place] [address] Phone: [phone] Subjective:   [name], am serving as a scribe for Dr. Antoine Primas.  I'm seeing this patient by the request  of:  Shirline Frees, NP  CC: Right shoulder pain  GEX:[id number]  12/10/2022 Repeat aspiration of only 40 cc.  Some improvement from previous 1.  Home exercises.  Increase activity slowly.  Follow-up with me again in 6 to 8 weeks due to patient's age and comorbidities associated with palpation as well as social determinants of health patient is not a surgical candidate     Updated 01/21/2023 Sara Garza is a 89 y.o. female coming in with complaint of R shoulder pain. Patient states continuing to have pain, seems to be worsening to a certain degree again.  Affecting daily activities.  Feels it more when she does a lot of walking because she has to use her walker       Past Medical History:  Diagnosis Date   Arthritis    CAD (coronary artery disease)    Carotid stenosis, left    50-60% stable   Diabetes mellitus    Glaucoma    Hyperlipidemia    Hypertension    Leg pain    ABIs 2/18: normal bilaterally.   Osteoporosis    Valvular heart disease    Aortic Stenosis s/p [name] 2014 // Mitral stenosis // Echo 09/2018: EF > 65, mod LVH, severe focal basal hypertrophy, RVSP 39.8, mild to mod MR, mod to severe MS (mean 9), AVR with mild AI, severe LAE (similar to prior echo)   Past Surgical History:  Procedure Laterality Date   ANOMALOUS PULMONARY VENOUS RETURN REPAIR, TOTAL     APPENDECTOMY     CARDIAC VALVE REPLACEMENT  04/29/2012   Cataracts Bilateral    CESAREAN SECTION     FOOT SURGERY     [name]   HIP ARTHROPLASTY Right 07/31/2013   Procedure: ARTHROPLASTY BIPOLAR HIP;  Surgeon: Shelda Pal, MD;  Location: [facility];  Service: Orthopedics;  Laterality: Right;   INTRAMEDULLARY (IM) NAIL INTERTROCHANTERIC Left 01/01/2020   Procedure: INTRAMEDULLARY (IM) NAIL  INTERTROCHANTRIC;  Surgeon: Samson Frederic, MD;  Location: [facility];  Service: Orthopedics;  Laterality: Left;   PERCUTANEOUS CORONARY STENT INTERVENTION (PCI-S) N/A 01/02/2012   Procedure: PERCUTANEOUS CORONARY STENT INTERVENTION (PCI-S);  Surgeon: Tonny Bollman, MD;  Location: [facility] CATH LAB;  Service: Cardiovascular;  Laterality: N/A;   Social History   Socioeconomic History   Marital status: Widowed    Spouse name: Not on file   Number of children: Not on file   Years of education: Not on file   Highest education level: Not on file  Occupational History   Not on file  Tobacco Use   Smoking status: Never    Passive exposure: Yes   Smokeless tobacco: Never  Substance and Sexual Activity   Alcohol use: Yes    Alcohol/week: 0.0 standard drinks of alcohol    Comment: very seldom   Drug use: No   Sexual activity: Not Currently  Other Topics Concern   Not on file  Social History Narrative   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      She likes to read and she takes classes at [employer]: Low Risk  (05/22/2022)   Overall Financial Resource Strain (CARDIA)  Difficulty of Paying Living Expenses: Not hard at all  Food Insecurity: No Food Insecurity (05/22/2022)   Hunger Vital Sign    Worried About Running Out of Food in the Last Year: Never true    Ran Out of Food in the Last Year: Never true  Transportation Needs: No Transportation Needs (05/22/2022)   PRAPARE - Administrator, Civil Service (Medical): No    Lack of Transportation (Non-Medical): No  Physical Activity: Inactive (05/22/2022)   Exercise Vital Sign    Days of Exercise per Week: 0 days    Minutes of Exercise per Session: 0 min  Stress: No Stress Concern Present (05/22/2022)   [employer] of Occupational Health - Occupational Stress Questionnaire    Feeling of Stress : Not at all  Social Connections:  Moderately Integrated (05/22/2022)   Social Connection and Isolation Panel [NHANES]    Frequency of Communication with Friends and Family: More than three times a week    Frequency of Social Gatherings with Friends and Family: More than three times a week    Attends Religious Services: More than 4 times per year    Active Member of [employer] or Organizations: Yes    Attends Banker Meetings: More than 4 times per year    Marital Status: Widowed   Allergies  Allergen Reactions   Statins Other (See Comments)    Muscle aches   Gabapentin Other (See Comments)    SOMNOLENCE   Family History  Problem Relation Age of Onset   COPD Father    Cancer Father        lung cancer   Lung cancer Other      Current Outpatient Medications (Cardiovascular):    amLODipine (NORVASC) 5 MG tablet, TAKE 1 TABLET(5 MG) BY MOUTH DAILY   carvedilol (COREG) 3.125 MG tablet, TAKE 1 TABLET(3.125 MG) BY MOUTH TWICE DAILY   ezetimibe (ZETIA) 10 MG tablet, Take 1 tablet (10 mg total) by mouth daily.   furosemide (LASIX) 20 MG tablet, Take 2 tablets (40 mg total) by mouth daily.   Current Outpatient Medications (Analgesics):    acetaminophen (TYLENOL) 500 MG tablet, Take 2 tablets (1,000 mg total) by mouth 4 (four) times daily.   aspirin EC 81 MG tablet, Take 81 mg by mouth daily. Swallow whole.   Current Outpatient Medications (Other):    amoxicillin (AMOXIL) 500 MG capsule, TAKE 4 CAPSULES BY MOUTH 1 HOUR PRIOR TO DENTAL PROCEDURES   Calcium Carb-Cholecalciferol 500-400 MG-UNIT TABS, Take 1 tablet by mouth daily.   Multiple Vitamin (MULTIVITAMIN) tablet, Take 1 tablet by mouth daily.   Multiple Vitamins-Minerals (PRESERVISION AREDS 2+MULTI VIT PO), Take 2 capsules by mouth in the morning and at bedtime.   Reviewed prior external information including notes and imaging from  primary care provider As well as notes that were available from care everywhere and other healthcare systems.  Past  medical history, social, surgical and family history all reviewed in electronic medical record.  No pertanent information unless stated regarding to the chief complaint.   Review of Systems:  No headache, visual changes, nausea, vomiting, diarrhea, constipation, dizziness, abdominal pain, skin rash, fevers, chills, night sweats, weight loss, swollen lymph nodes, body aches, joint swelling, chest pain, shortness of breath, mood changes. POSITIVE muscle aches  Objective  Blood pressure 118/72, pulse 84, height 4\' 11"  (1.499 m), weight 128 lb (58.1 kg), SpO2 97%.   General: No apparent distress alert and oriented x3 mood and affect normal, dressed  appropriately.  Antalgic gait noted using the aid of a walker.  Patient's right shoulder has significant fluid crepitus noted with any range of motion.  Neurovascularly intact.  Procedure: Real-time Ultrasound Guided Injection of right shoulder Device: GE Logiq Q7 Ultrasound guided injection is preferred based studies that show increased duration, increased effect, greater accuracy, decreased procedural pain, increased response rate, and decreased cost with ultrasound guided versus blind injection.  Verbal informed consent obtained.  Time-out conducted.  Noted no overlying erythema, induration, or other signs of local infection.  Skin prepped in a sterile fashion.  Local anesthesia: Topical Ethyl chloride.  With sterile technique and under real time ultrasound guidance: With an 18-gauge 1-1/2 inch needle patient was injected with 0.5 cc of 0.5% Marcaine from an anterior approach.  Aspirated 65 cc of sterile light-colored fluid.  Then injected 1 cc of Kenalog 40 mg/mL. Completed without difficulty  Pain immediately resolved suggesting accurate placement of the medication.  Advised to call if fevers/chills, erythema, induration, drainage, or persistent bleeding.  Impression: Technically successful ultrasound guided injection.    Impression and  Recommendations:     The above documentation has been reviewed and is accurate and complete Judi Saa, DO

## 2023-01-21 ENCOUNTER — Other Ambulatory Visit: Payer: Self-pay

## 2023-01-21 ENCOUNTER — Encounter: Payer: Self-pay | Admitting: Family Medicine

## 2023-01-21 ENCOUNTER — Ambulatory Visit: Payer: Medicare Other | Admitting: Family Medicine

## 2023-01-21 VITALS — BP 118/72 | HR 84 | Ht 59.0 in | Wt 128.0 lb

## 2023-01-21 DIAGNOSIS — M25511 Pain in right shoulder: Secondary | ICD-10-CM | POA: Diagnosis not present

## 2023-01-21 DIAGNOSIS — M75101 Unspecified rotator cuff tear or rupture of right shoulder, not specified as traumatic: Secondary | ICD-10-CM

## 2023-01-21 DIAGNOSIS — M12811 Other specific arthropathies, not elsewhere classified, right shoulder: Secondary | ICD-10-CM | POA: Diagnosis not present

## 2023-01-21 NOTE — Patient Instructions (Addendum)
Drained R shoulder today Be careful with too much motion of both shoulders due to arthritis which is quite severe  See me again in 8 weeks for aspiration of L shoulder

## 2023-01-21 NOTE — Assessment & Plan Note (Addendum)
Continues to have chronic effusion.  Chronic problem with worsening symptoms.  Due to patient's age and comorbidities she is not a surgical candidate.  Social determinant of health includes physical activity and needing the walker which does slow the shoulder.  We discussed with patient about continuing with icing regimen and home exercises.  Discussed which activities to do and which ones to avoid.  Can follow-up with me again in 10 to 12 weeks to further evaluate.  Patient unfortunately is also having worsening swelling on the left side that we will need to monitor.

## 2023-01-22 DIAGNOSIS — I130 Hypertensive heart and chronic kidney disease with heart failure and stage 1 through stage 4 chronic kidney disease, or unspecified chronic kidney disease: Secondary | ICD-10-CM | POA: Diagnosis not present

## 2023-01-22 DIAGNOSIS — E1122 Type 2 diabetes mellitus with diabetic chronic kidney disease: Secondary | ICD-10-CM | POA: Diagnosis not present

## 2023-01-22 DIAGNOSIS — E1142 Type 2 diabetes mellitus with diabetic polyneuropathy: Secondary | ICD-10-CM | POA: Diagnosis not present

## 2023-01-22 DIAGNOSIS — M19011 Primary osteoarthritis, right shoulder: Secondary | ICD-10-CM | POA: Diagnosis not present

## 2023-01-22 DIAGNOSIS — N183 Chronic kidney disease, stage 3 unspecified: Secondary | ICD-10-CM | POA: Diagnosis not present

## 2023-01-22 DIAGNOSIS — I5032 Chronic diastolic (congestive) heart failure: Secondary | ICD-10-CM | POA: Diagnosis not present

## 2023-01-23 DIAGNOSIS — Z23 Encounter for immunization: Secondary | ICD-10-CM | POA: Diagnosis not present

## 2023-01-27 DIAGNOSIS — I130 Hypertensive heart and chronic kidney disease with heart failure and stage 1 through stage 4 chronic kidney disease, or unspecified chronic kidney disease: Secondary | ICD-10-CM | POA: Diagnosis not present

## 2023-01-27 DIAGNOSIS — E1122 Type 2 diabetes mellitus with diabetic chronic kidney disease: Secondary | ICD-10-CM | POA: Diagnosis not present

## 2023-01-27 DIAGNOSIS — E1142 Type 2 diabetes mellitus with diabetic polyneuropathy: Secondary | ICD-10-CM | POA: Diagnosis not present

## 2023-01-27 DIAGNOSIS — I5032 Chronic diastolic (congestive) heart failure: Secondary | ICD-10-CM | POA: Diagnosis not present

## 2023-01-27 DIAGNOSIS — N183 Chronic kidney disease, stage 3 unspecified: Secondary | ICD-10-CM | POA: Diagnosis not present

## 2023-01-27 DIAGNOSIS — M19011 Primary osteoarthritis, right shoulder: Secondary | ICD-10-CM | POA: Diagnosis not present

## 2023-02-05 DIAGNOSIS — E1142 Type 2 diabetes mellitus with diabetic polyneuropathy: Secondary | ICD-10-CM | POA: Diagnosis not present

## 2023-02-05 DIAGNOSIS — N183 Chronic kidney disease, stage 3 unspecified: Secondary | ICD-10-CM | POA: Diagnosis not present

## 2023-02-05 DIAGNOSIS — E1122 Type 2 diabetes mellitus with diabetic chronic kidney disease: Secondary | ICD-10-CM | POA: Diagnosis not present

## 2023-02-05 DIAGNOSIS — I5032 Chronic diastolic (congestive) heart failure: Secondary | ICD-10-CM | POA: Diagnosis not present

## 2023-02-05 DIAGNOSIS — M19011 Primary osteoarthritis, right shoulder: Secondary | ICD-10-CM | POA: Diagnosis not present

## 2023-02-05 DIAGNOSIS — I130 Hypertensive heart and chronic kidney disease with heart failure and stage 1 through stage 4 chronic kidney disease, or unspecified chronic kidney disease: Secondary | ICD-10-CM | POA: Diagnosis not present

## 2023-02-15 ENCOUNTER — Other Ambulatory Visit: Payer: Self-pay

## 2023-02-15 ENCOUNTER — Encounter (HOSPITAL_BASED_OUTPATIENT_CLINIC_OR_DEPARTMENT_OTHER): Payer: Self-pay

## 2023-02-15 ENCOUNTER — Inpatient Hospital Stay (HOSPITAL_BASED_OUTPATIENT_CLINIC_OR_DEPARTMENT_OTHER)
Admission: EM | Admit: 2023-02-15 | Discharge: 2023-03-02 | DRG: 291 | Disposition: A | Discharge status: Skilled Nursing Facility | Payer: Medicare Other | Attending: Internal Medicine | Admitting: Internal Medicine

## 2023-02-15 ENCOUNTER — Emergency Department (HOSPITAL_BASED_OUTPATIENT_CLINIC_OR_DEPARTMENT_OTHER): Payer: Medicare Other

## 2023-02-15 DIAGNOSIS — M810 Age-related osteoporosis without current pathological fracture: Secondary | ICD-10-CM | POA: Diagnosis present

## 2023-02-15 DIAGNOSIS — M545 Low back pain, unspecified: Secondary | ICD-10-CM | POA: Diagnosis not present

## 2023-02-15 DIAGNOSIS — D631 Anemia in chronic kidney disease: Secondary | ICD-10-CM | POA: Diagnosis present

## 2023-02-15 DIAGNOSIS — Z96641 Presence of right artificial hip joint: Secondary | ICD-10-CM | POA: Diagnosis present

## 2023-02-15 DIAGNOSIS — R2689 Other abnormalities of gait and mobility: Secondary | ICD-10-CM | POA: Diagnosis not present

## 2023-02-15 DIAGNOSIS — Z7982 Long term (current) use of aspirin: Secondary | ICD-10-CM | POA: Diagnosis not present

## 2023-02-15 DIAGNOSIS — J811 Chronic pulmonary edema: Secondary | ICD-10-CM | POA: Diagnosis not present

## 2023-02-15 DIAGNOSIS — Z955 Presence of coronary angioplasty implant and graft: Secondary | ICD-10-CM | POA: Diagnosis not present

## 2023-02-15 DIAGNOSIS — J209 Acute bronchitis, unspecified: Secondary | ICD-10-CM | POA: Diagnosis present

## 2023-02-15 DIAGNOSIS — Z1152 Encounter for screening for COVID-19: Secondary | ICD-10-CM

## 2023-02-15 DIAGNOSIS — N183 Chronic kidney disease, stage 3 unspecified: Secondary | ICD-10-CM | POA: Diagnosis present

## 2023-02-15 DIAGNOSIS — N179 Acute kidney failure, unspecified: Secondary | ICD-10-CM | POA: Diagnosis present

## 2023-02-15 DIAGNOSIS — J90 Pleural effusion, not elsewhere classified: Secondary | ICD-10-CM | POA: Diagnosis not present

## 2023-02-15 DIAGNOSIS — N1832 Chronic kidney disease, stage 3b: Secondary | ICD-10-CM | POA: Diagnosis not present

## 2023-02-15 DIAGNOSIS — I10 Essential (primary) hypertension: Secondary | ICD-10-CM | POA: Diagnosis not present

## 2023-02-15 DIAGNOSIS — E1169 Type 2 diabetes mellitus with other specified complication: Secondary | ICD-10-CM | POA: Diagnosis present

## 2023-02-15 DIAGNOSIS — I5021 Acute systolic (congestive) heart failure: Secondary | ICD-10-CM | POA: Diagnosis not present

## 2023-02-15 DIAGNOSIS — I2510 Atherosclerotic heart disease of native coronary artery without angina pectoris: Secondary | ICD-10-CM | POA: Diagnosis not present

## 2023-02-15 DIAGNOSIS — I6522 Occlusion and stenosis of left carotid artery: Secondary | ICD-10-CM | POA: Diagnosis not present

## 2023-02-15 DIAGNOSIS — Z801 Family history of malignant neoplasm of trachea, bronchus and lung: Secondary | ICD-10-CM

## 2023-02-15 DIAGNOSIS — I509 Heart failure, unspecified: Principal | ICD-10-CM

## 2023-02-15 DIAGNOSIS — D62 Acute posthemorrhagic anemia: Secondary | ICD-10-CM | POA: Diagnosis not present

## 2023-02-15 DIAGNOSIS — R531 Weakness: Secondary | ICD-10-CM | POA: Diagnosis not present

## 2023-02-15 DIAGNOSIS — R0989 Other specified symptoms and signs involving the circulatory and respiratory systems: Secondary | ICD-10-CM | POA: Diagnosis not present

## 2023-02-15 DIAGNOSIS — I130 Hypertensive heart and chronic kidney disease with heart failure and stage 1 through stage 4 chronic kidney disease, or unspecified chronic kidney disease: Secondary | ICD-10-CM | POA: Diagnosis not present

## 2023-02-15 DIAGNOSIS — Z66 Do not resuscitate: Secondary | ICD-10-CM | POA: Diagnosis not present

## 2023-02-15 DIAGNOSIS — I5033 Acute on chronic diastolic (congestive) heart failure: Secondary | ICD-10-CM | POA: Diagnosis not present

## 2023-02-15 DIAGNOSIS — J9601 Acute respiratory failure with hypoxia: Secondary | ICD-10-CM | POA: Diagnosis not present

## 2023-02-15 DIAGNOSIS — E785 Hyperlipidemia, unspecified: Secondary | ICD-10-CM | POA: Diagnosis not present

## 2023-02-15 DIAGNOSIS — I5032 Chronic diastolic (congestive) heart failure: Secondary | ICD-10-CM | POA: Diagnosis not present

## 2023-02-15 DIAGNOSIS — M7989 Other specified soft tissue disorders: Secondary | ICD-10-CM | POA: Diagnosis not present

## 2023-02-15 DIAGNOSIS — E1122 Type 2 diabetes mellitus with diabetic chronic kidney disease: Secondary | ICD-10-CM | POA: Diagnosis not present

## 2023-02-15 DIAGNOSIS — E876 Hypokalemia: Secondary | ICD-10-CM | POA: Diagnosis present

## 2023-02-15 DIAGNOSIS — I050 Rheumatic mitral stenosis: Secondary | ICD-10-CM | POA: Diagnosis not present

## 2023-02-15 DIAGNOSIS — Z789 Other specified health status: Secondary | ICD-10-CM

## 2023-02-15 DIAGNOSIS — Z515 Encounter for palliative care: Secondary | ICD-10-CM | POA: Diagnosis not present

## 2023-02-15 DIAGNOSIS — Z79899 Other long term (current) drug therapy: Secondary | ICD-10-CM

## 2023-02-15 DIAGNOSIS — I110 Hypertensive heart disease with heart failure: Secondary | ICD-10-CM | POA: Diagnosis not present

## 2023-02-15 DIAGNOSIS — N184 Chronic kidney disease, stage 4 (severe): Secondary | ICD-10-CM | POA: Diagnosis present

## 2023-02-15 DIAGNOSIS — H409 Unspecified glaucoma: Secondary | ICD-10-CM | POA: Diagnosis not present

## 2023-02-15 DIAGNOSIS — I340 Nonrheumatic mitral (valve) insufficiency: Secondary | ICD-10-CM | POA: Diagnosis present

## 2023-02-15 DIAGNOSIS — Z952 Presence of prosthetic heart valve: Secondary | ICD-10-CM

## 2023-02-15 DIAGNOSIS — Z825 Family history of asthma and other chronic lower respiratory diseases: Secondary | ICD-10-CM

## 2023-02-15 DIAGNOSIS — I447 Left bundle-branch block, unspecified: Secondary | ICD-10-CM | POA: Diagnosis present

## 2023-02-15 DIAGNOSIS — R278 Other lack of coordination: Secondary | ICD-10-CM | POA: Diagnosis not present

## 2023-02-15 DIAGNOSIS — M6281 Muscle weakness (generalized): Secondary | ICD-10-CM | POA: Diagnosis not present

## 2023-02-15 DIAGNOSIS — Z7401 Bed confinement status: Secondary | ICD-10-CM | POA: Diagnosis not present

## 2023-02-15 DIAGNOSIS — I3139 Other pericardial effusion (noninflammatory): Secondary | ICD-10-CM | POA: Diagnosis not present

## 2023-02-15 DIAGNOSIS — M1990 Unspecified osteoarthritis, unspecified site: Secondary | ICD-10-CM | POA: Diagnosis not present

## 2023-02-15 DIAGNOSIS — I440 Atrioventricular block, first degree: Secondary | ICD-10-CM | POA: Diagnosis present

## 2023-02-15 DIAGNOSIS — Z7189 Other specified counseling: Secondary | ICD-10-CM | POA: Diagnosis not present

## 2023-02-15 DIAGNOSIS — M75101 Unspecified rotator cuff tear or rupture of right shoulder, not specified as traumatic: Secondary | ICD-10-CM | POA: Diagnosis not present

## 2023-02-15 HISTORY — DX: Other specified health status: Z78.9

## 2023-02-15 LAB — TROPONIN I (HIGH SENSITIVITY)
Troponin I (High Sensitivity): 46 ng/L — ABNORMAL HIGH (ref <18)
Troponin I (High Sensitivity): 39 ng/L — ABNORMAL HIGH (ref <18)

## 2023-02-15 LAB — COMPREHENSIVE METABOLIC PANEL
ALT: 9 U/L (ref 0–44)
AST: 13 U/L — ABNORMAL LOW (ref 15–41)
Albumin: 3.9 g/dL (ref 3.5–5.0)
Alkaline Phosphatase: 65 U/L (ref 38–126)
Anion gap: 12 (ref 5–15)
BUN: 46 mg/dL — ABNORMAL HIGH (ref 8–23)
CO2: 25 mmol/L (ref 22–32)
Calcium: 8.6 mg/dL — ABNORMAL LOW (ref 8.9–10.3)
Chloride: 104 mmol/L (ref 98–111)
Creatinine, Ser: 1.81 mg/dL — ABNORMAL HIGH (ref 0.44–1.00)
GFR, Estimated: 25 mL/min — ABNORMAL LOW (ref >60)
Glucose, Bld: 100 mg/dL — ABNORMAL HIGH (ref 70–99)
Potassium: 3.6 mmol/L (ref 3.5–5.1)
Sodium: 141 mmol/L (ref 135–145)
Total Bilirubin: 0.7 mg/dL (ref <1.2)
Total Protein: 7.2 g/dL (ref 6.5–8.1)

## 2023-02-15 LAB — CBC WITH DIFFERENTIAL/PLATELET
Abs Immature Granulocytes: 0.02 10*3/uL (ref 0.00–0.07)
Basophils Absolute: 0 10*3/uL (ref 0.0–0.1)
Basophils Relative: 1 %
Eosinophils Absolute: 0.2 10*3/uL (ref 0.0–0.5)
Eosinophils Relative: 3 %
HCT: 32.9 % — ABNORMAL LOW (ref 36.0–46.0)
Hemoglobin: 10.8 g/dL — ABNORMAL LOW (ref 12.0–15.0)
Immature Granulocytes: 0 %
Lymphocytes Relative: 12 %
Lymphs Abs: 0.7 10*3/uL (ref 0.7–4.0)
MCH: 30.9 pg (ref 26.0–34.0)
MCHC: 32.8 g/dL (ref 30.0–36.0)
MCV: 94.3 fL (ref 80.0–100.0)
Monocytes Absolute: 0.9 10*3/uL (ref 0.1–1.0)
Monocytes Relative: 16 %
Neutro Abs: 4.1 10*3/uL (ref 1.7–7.7)
Neutrophils Relative %: 68 %
Platelets: 250 10*3/uL (ref 150–400)
RBC: 3.49 MIL/uL — ABNORMAL LOW (ref 3.87–5.11)
RDW: 13.4 % (ref 11.5–15.5)
WBC: 6 10*3/uL (ref 4.0–10.5)
nRBC: 0 % (ref 0.0–0.2)

## 2023-02-15 LAB — I-STAT VENOUS BLOOD GAS, ED
Acid-Base Excess: 3 mmol/L — ABNORMAL HIGH (ref 0.0–2.0)
Bicarbonate: 27.4 mmol/L (ref 20.0–28.0)
Calcium, Ion: 1.14 mmol/L — ABNORMAL LOW (ref 1.15–1.40)
HCT: 41 % (ref 36.0–46.0)
Hemoglobin: 13.9 g/dL (ref 12.0–15.0)
O2 Saturation: 71 %
Potassium: 3.5 mmol/L (ref 3.5–5.1)
Sodium: 142 mmol/L (ref 135–145)
TCO2: 29 mmol/L (ref 22–32)
pCO2, Ven: 42.5 mm[Hg] — ABNORMAL LOW (ref 44–60)
pH, Ven: 7.418 (ref 7.25–7.43)
pO2, Ven: 37 mm[Hg] (ref 32–45)

## 2023-02-15 LAB — RESP PANEL BY RT-PCR (RSV, FLU A&B, COVID)  RVPGX2
Influenza A by PCR: NEGATIVE
Influenza B by PCR: NEGATIVE
Resp Syncytial Virus by PCR: NEGATIVE
SARS Coronavirus 2 by RT PCR: NEGATIVE

## 2023-02-15 LAB — BRAIN NATRIURETIC PEPTIDE: B Natriuretic Peptide: 1486 pg/mL — ABNORMAL HIGH (ref 0.0–100.0)

## 2023-02-15 MED ORDER — FUROSEMIDE 10 MG/ML IJ SOLN
40.0000 mg | Freq: Once | INTRAMUSCULAR | Status: AC
  Administered 2023-02-15: 40 mg via INTRAVENOUS
  Filled 2023-02-15: qty 4

## 2023-02-15 NOTE — ED Notes (Signed)
RT ran VBG on pt with the following results. MD notified of results.  Latest Reference Range & Units 02/15/23 20:22  Sample type  VENOUS  pH, Ven 7.25 - 7.43  7.418  pCO2, Ven 44 - 60 mmHg 42.5 (L)  pO2, Ven 32 - 45 mmHg 37  TCO2 22 - 32 mmol/L 29  Acid-Base Excess 0.0 - 2.0 mmol/L 3.0 (H)  Bicarbonate 20.0 - 28.0 mmol/L 27.4  O2 Saturation % 71  Collection site  IV start  (L): Data is abnormally low (H): Data is abnormally high

## 2023-02-15 NOTE — ED Triage Notes (Signed)
She is here d/t shortness of breath with orthopnea x 2 days. She denies fever/cough nor any other sign of current illness. She tells me she sees Dr. Excell Seltzer (Cardio.) she is ambulatory with a walker per her norm.

## 2023-02-15 NOTE — ED Notes (Signed)
Pt. Made comfortable for the night, lights turned down, patient states she'll attempt to get some sleep

## 2023-02-15 NOTE — ED Provider Notes (Addendum)
[place] EMERGENCY DEPARTMENT AT [facility] Provider Note   CSN: [id number] Arrival date & time: 02/15/23  1652     History  Chief Complaint  Patient presents with   Shortness of Breath    Sara Garza is a 89 y.o. female.   Shortness of Breath Associated symptoms: no abdominal pain and no chest pain      89 year old female with medical history significant for CAD, CKD, DM 2, HTN, CHF with diastolic dysfunction, status post [name] presenting to the emergency department with shortness of breath.  The patient has been having increasing shortness of breath and dyspnea on exertion, orthopnea, bilateral lower extremity edema despite her home diuretic use.  She is normally on 20 mg of Lasix twice daily and took a double dose yesterday with 40 mg in the morning and 40 mg at night.  She denies any chest pain, or abdominal pain.  She endorses a pulsating sensation in her abdomen.  Home Medications Prior to Admission medications   Medication Sig Start Date End Date Taking? Authorizing Provider  acetaminophen (TYLENOL) 500 MG tablet Take 2 tablets (1,000 mg total) by mouth 4 (four) times daily. 01/04/20   Lewie Chamber, MD  amLODipine (NORVASC) 5 MG tablet TAKE 1 TABLET(5 MG) BY MOUTH DAILY 06/13/22   Tonny Bollman, MD  amoxicillin (AMOXIL) 500 MG capsule TAKE 4 CAPSULES BY MOUTH 1 HOUR PRIOR TO DENTAL PROCEDURES 11/27/22   Tonny Bollman, MD  aspirin EC 81 MG tablet Take 81 mg by mouth daily. Swallow whole.    [provider]  Calcium Carb-Cholecalciferol 500-400 MG-UNIT TABS Take 1 tablet by mouth daily.    [provider]  carvedilol (COREG) 3.125 MG tablet TAKE 1 TABLET(3.125 MG) BY MOUTH TWICE DAILY 06/13/22   Tonny Bollman, MD  ezetimibe (ZETIA) 10 MG tablet Take 1 tablet (10 mg total) by mouth daily. 10/04/22   Tonny Bollman, MD  furosemide (LASIX) 20 MG tablet Take 2 tablets (40 mg total) by mouth daily. 06/17/22   Tonny Bollman, MD  Multiple Vitamin  (MULTIVITAMIN) tablet Take 1 tablet by mouth daily.    [provider]  Multiple Vitamins-Minerals (PRESERVISION AREDS 2+MULTI VIT PO) Take 2 capsules by mouth in the morning and at bedtime.    [provider]      Allergies    Statins and Gabapentin    Review of Systems   Review of Systems  Respiratory:  Positive for shortness of breath.   Cardiovascular:  Positive for leg swelling. Negative for chest pain.  Gastrointestinal:  Negative for abdominal pain.  All other systems reviewed and are negative.   Physical Exam Updated Vital Signs BP 128/73   Pulse 81   Temp 98.1 F (36.7 C) (Oral)   Resp 18   SpO2 91%  Physical Exam Vitals and nursing note reviewed.  Constitutional:      General: She is not in acute distress.    Appearance: She is well-developed.  HENT:     Head: Normocephalic and atraumatic.  Eyes:     Conjunctiva/sclera: Conjunctivae normal.  Neck:     Vascular: JVD present.  Cardiovascular:     Rate and Rhythm: Normal rate and regular rhythm.     Heart sounds: No murmur heard. Pulmonary:     Effort: Pulmonary effort is normal. Tachypnea present. No respiratory distress.     Breath sounds: Examination of the right-lower field reveals rales. Examination of the left-lower field reveals rales. Rales present.  Abdominal:  Palpations: Abdomen is soft.     Tenderness: There is no abdominal tenderness.  Musculoskeletal:        General: No swelling.     Cervical back: Neck supple.     Right lower leg: Edema present.     Left lower leg: Edema present.     Comments: 2+ bilateral pitting lower extremity edema  Skin:    General: Skin is warm and dry.     Capillary Refill: Capillary refill takes less than 2 seconds.  Neurological:     Mental Status: She is alert.  Psychiatric:        Mood and Affect: Mood normal.     ED Results / Procedures / Treatments   Labs (all labs ordered are listed, but only abnormal results are displayed) Labs  Reviewed  CBC WITH DIFFERENTIAL/PLATELET - Abnormal; Notable for the following components:      Result Value   RBC 3.49 (*)    Hemoglobin 10.8 (*)    HCT 32.9 (*)    All other components within normal limits  COMPREHENSIVE METABOLIC PANEL - Abnormal; Notable for the following components:   Glucose, Bld 100 (*)    BUN 46 (*)    Creatinine, Ser 1.81 (*)    Calcium 8.6 (*)    AST 13 (*)    GFR, Estimated 25 (*)    All other components within normal limits  BRAIN NATRIURETIC PEPTIDE - Abnormal; Notable for the following components:   B Natriuretic Peptide 1,486.0 (*)    All other components within normal limits  TROPONIN I (HIGH SENSITIVITY) - Abnormal; Notable for the following components:   Troponin I (High Sensitivity) 46 (*)    All other components within normal limits  RESP PANEL BY RT-PCR (RSV, FLU A&B, COVID)  [id number]  TROPONIN I (HIGH SENSITIVITY)    EKG EKG Interpretation Date/Time:  Saturday February 15 2023 17:17:15 EST Ventricular Rate:  87 PR Interval:  234 QRS Duration:  149 QT Interval:  452 QTC Calculation: 544 R Axis:   131  Text Interpretation: Sinus rhythm Prolonged PR interval Nonspecific intraventricular conduction delay Probable anteroseptal infarct, recent Confirmed by Ernie Avena ([id number]) on 02/15/2023 6:17:55 PM  Radiology DG Chest Portable 1 View  Result Date: 02/15/2023 CLINICAL DATA:  Hypoxia and shortness of breath. EXAM: PORTABLE CHEST 1 VIEW COMPARISON:  Chest CT dated 02/14/2020. FINDINGS: There is mild cardiomegaly with central vascular congestion and mild edema. Small bilateral pleural effusions and bibasilar atelectasis. Pneumonia is not excluded. No pneumothorax. Aortic valve repair. Atherosclerotic calcification of the aorta. No acute osseous pathology. IMPRESSION: Mild CHF and small bilateral pleural effusions. Electronically Signed   By: Elgie Collard M.D.   On: 02/15/2023 18:49    Procedures Procedures    Medications Ordered in  ED Medications  furosemide (LASIX) injection 40 mg (40 mg Intravenous Given 02/15/23 1815)    ED Course/ Medical Decision Making/ A&P Clinical Course as of 02/15/23 1927  Sat Feb 15, 2023  1926 B Natriuretic Peptide(!): 1,486.0 [JL]  1926 Troponin I (High Sensitivity)(!): 46 [JL]  1926 WBC: 6.0 [JL]  1926 Creatinine(!): 1.81 [JL]    Clinical Course User Index [JL] Ernie Avena, MD                                 Medical Decision Making Amount and/or Complexity of Data Reviewed Labs: ordered. Decision-making details documented in ED Course. Radiology: ordered.  Risk  Prescription drug management. Decision regarding hospitalization.    89 year old female with medical history significant for CAD, CKD, DM 2, HTN, CHF with diastolic dysfunction, status post [name] presenting to the emergency department with shortness of breath.  The patient has been having increasing shortness of breath and dyspnea on exertion, orthopnea, bilateral lower extremity edema despite her home diuretic use.  She is normally on 20 mg of Lasix twice daily and took a double dose yesterday with 40 mg in the morning and 40 mg at night.  She denies any chest pain, or abdominal pain.  She endorses a pulsating sensation in her abdomen.  On arrival, the patient was afebrile, tachycardic heart rate 102, tachypneic on my exam with a respiratory rate in the 30s, initially saturating 87% on room air, subsequently placed on oxygen via nasal cannula with improvement, normotensive BP 116/86.  Patient presenting with dyspnea on exertion, orthopnea for the past 2 days and on exam appears to be volume overloaded.  Bibasilar rales noted with JVD and 2+ bilateral pitting edema in the lower extremities.  This is despite doubling up on the patient's home diuretic.  Concern for CHF exacerbation and acute hypoxic respiratory failure.  IV access was obtained and the patient was administered 40 mg of IV Lasix.  The patient denies any chest  pain or abdominal pain.  Lower concern for ACS, low concern for aortic dissection or AAA.  Initial EKG revealed sinus rhythm, ventricular rate 8 7, prolonged PR interval, Q waves in the anteroseptal leads, no STEMI.  Laboratory evaluation revealed a BNP of 1486, troponin of 46, CBC without a leukocytosis, mild anemia to 10.8.  COVID and influenza PCR testing was collected and pending, CMP was pending.  Chest x-ray on my review revealed already megaly with mild interstitial markings, full read: IMPRESSION:  Mild CHF and small bilateral pleural effusions.    Repeat troponin pending however low concern for ACS with no chest discomfort. Repeat trop downtrending at 39.  Due to concern for hypoxic respiratory failure, hypervolemia with concern for CHF exacerbation, hospitalist medicine was consulted for admission, Dr. Adela Glimpse accepted the patient in admission to a progressive bed.    Final Clinical Impression(s) / ED Diagnoses Final diagnoses:  Acute on chronic congestive heart failure, unspecified heart failure type (HCC)  Acute respiratory failure with hypoxia [facility])    Rx / DC Orders ED Discharge Orders     None         Ernie Avena, MD 02/15/23 Windy Carina    Ernie Avena, MD 02/15/23 2051

## 2023-02-16 ENCOUNTER — Inpatient Hospital Stay (HOSPITAL_COMMUNITY): Payer: Medicare Other

## 2023-02-16 DIAGNOSIS — R278 Other lack of coordination: Secondary | ICD-10-CM | POA: Diagnosis not present

## 2023-02-16 DIAGNOSIS — I5021 Acute systolic (congestive) heart failure: Secondary | ICD-10-CM | POA: Diagnosis not present

## 2023-02-16 DIAGNOSIS — Z7401 Bed confinement status: Secondary | ICD-10-CM | POA: Diagnosis not present

## 2023-02-16 DIAGNOSIS — I3139 Other pericardial effusion (noninflammatory): Secondary | ICD-10-CM | POA: Diagnosis present

## 2023-02-16 DIAGNOSIS — I5033 Acute on chronic diastolic (congestive) heart failure: Secondary | ICD-10-CM

## 2023-02-16 DIAGNOSIS — Z96641 Presence of right artificial hip joint: Secondary | ICD-10-CM | POA: Diagnosis present

## 2023-02-16 DIAGNOSIS — Z66 Do not resuscitate: Secondary | ICD-10-CM | POA: Diagnosis present

## 2023-02-16 DIAGNOSIS — E1169 Type 2 diabetes mellitus with other specified complication: Secondary | ICD-10-CM | POA: Diagnosis present

## 2023-02-16 DIAGNOSIS — Z515 Encounter for palliative care: Secondary | ICD-10-CM | POA: Diagnosis not present

## 2023-02-16 DIAGNOSIS — E785 Hyperlipidemia, unspecified: Secondary | ICD-10-CM | POA: Diagnosis present

## 2023-02-16 DIAGNOSIS — M1990 Unspecified osteoarthritis, unspecified site: Secondary | ICD-10-CM | POA: Diagnosis not present

## 2023-02-16 DIAGNOSIS — Z79899 Other long term (current) drug therapy: Secondary | ICD-10-CM | POA: Diagnosis not present

## 2023-02-16 DIAGNOSIS — I5032 Chronic diastolic (congestive) heart failure: Secondary | ICD-10-CM | POA: Diagnosis not present

## 2023-02-16 DIAGNOSIS — N1832 Chronic kidney disease, stage 3b: Secondary | ICD-10-CM | POA: Diagnosis not present

## 2023-02-16 DIAGNOSIS — Z825 Family history of asthma and other chronic lower respiratory diseases: Secondary | ICD-10-CM | POA: Diagnosis not present

## 2023-02-16 DIAGNOSIS — Z1152 Encounter for screening for COVID-19: Secondary | ICD-10-CM | POA: Diagnosis not present

## 2023-02-16 DIAGNOSIS — E1122 Type 2 diabetes mellitus with diabetic chronic kidney disease: Secondary | ICD-10-CM | POA: Diagnosis present

## 2023-02-16 DIAGNOSIS — M75101 Unspecified rotator cuff tear or rupture of right shoulder, not specified as traumatic: Secondary | ICD-10-CM | POA: Diagnosis not present

## 2023-02-16 DIAGNOSIS — I130 Hypertensive heart and chronic kidney disease with heart failure and stage 1 through stage 4 chronic kidney disease, or unspecified chronic kidney disease: Secondary | ICD-10-CM | POA: Diagnosis present

## 2023-02-16 DIAGNOSIS — E876 Hypokalemia: Secondary | ICD-10-CM | POA: Diagnosis present

## 2023-02-16 DIAGNOSIS — R2689 Other abnormalities of gait and mobility: Secondary | ICD-10-CM | POA: Diagnosis not present

## 2023-02-16 DIAGNOSIS — I6522 Occlusion and stenosis of left carotid artery: Secondary | ICD-10-CM | POA: Diagnosis not present

## 2023-02-16 DIAGNOSIS — I509 Heart failure, unspecified: Secondary | ICD-10-CM | POA: Diagnosis not present

## 2023-02-16 DIAGNOSIS — M545 Low back pain, unspecified: Secondary | ICD-10-CM | POA: Diagnosis not present

## 2023-02-16 DIAGNOSIS — N179 Acute kidney failure, unspecified: Secondary | ICD-10-CM | POA: Diagnosis present

## 2023-02-16 DIAGNOSIS — Z955 Presence of coronary angioplasty implant and graft: Secondary | ICD-10-CM | POA: Diagnosis not present

## 2023-02-16 DIAGNOSIS — J9601 Acute respiratory failure with hypoxia: Secondary | ICD-10-CM | POA: Diagnosis present

## 2023-02-16 DIAGNOSIS — I447 Left bundle-branch block, unspecified: Secondary | ICD-10-CM | POA: Diagnosis present

## 2023-02-16 DIAGNOSIS — H409 Unspecified glaucoma: Secondary | ICD-10-CM | POA: Diagnosis not present

## 2023-02-16 DIAGNOSIS — I2510 Atherosclerotic heart disease of native coronary artery without angina pectoris: Secondary | ICD-10-CM | POA: Diagnosis present

## 2023-02-16 DIAGNOSIS — Z7982 Long term (current) use of aspirin: Secondary | ICD-10-CM | POA: Diagnosis not present

## 2023-02-16 DIAGNOSIS — D631 Anemia in chronic kidney disease: Secondary | ICD-10-CM | POA: Diagnosis present

## 2023-02-16 DIAGNOSIS — M6281 Muscle weakness (generalized): Secondary | ICD-10-CM | POA: Diagnosis not present

## 2023-02-16 DIAGNOSIS — Z952 Presence of prosthetic heart valve: Secondary | ICD-10-CM | POA: Diagnosis not present

## 2023-02-16 DIAGNOSIS — I050 Rheumatic mitral stenosis: Secondary | ICD-10-CM | POA: Diagnosis not present

## 2023-02-16 DIAGNOSIS — D62 Acute posthemorrhagic anemia: Secondary | ICD-10-CM | POA: Diagnosis not present

## 2023-02-16 DIAGNOSIS — M7989 Other specified soft tissue disorders: Secondary | ICD-10-CM | POA: Diagnosis not present

## 2023-02-16 DIAGNOSIS — I10 Essential (primary) hypertension: Secondary | ICD-10-CM | POA: Diagnosis not present

## 2023-02-16 DIAGNOSIS — M810 Age-related osteoporosis without current pathological fracture: Secondary | ICD-10-CM | POA: Diagnosis present

## 2023-02-16 DIAGNOSIS — Z7189 Other specified counseling: Secondary | ICD-10-CM | POA: Diagnosis not present

## 2023-02-16 DIAGNOSIS — I340 Nonrheumatic mitral (valve) insufficiency: Secondary | ICD-10-CM | POA: Diagnosis present

## 2023-02-16 DIAGNOSIS — R531 Weakness: Secondary | ICD-10-CM | POA: Diagnosis not present

## 2023-02-16 DIAGNOSIS — N184 Chronic kidney disease, stage 4 (severe): Secondary | ICD-10-CM | POA: Diagnosis present

## 2023-02-16 DIAGNOSIS — Z801 Family history of malignant neoplasm of trachea, bronchus and lung: Secondary | ICD-10-CM | POA: Diagnosis not present

## 2023-02-16 LAB — CBC
HCT: 35.2 % — ABNORMAL LOW (ref 36.0–46.0)
Hemoglobin: 11 g/dL — ABNORMAL LOW (ref 12.0–15.0)
MCH: 29.8 pg (ref 26.0–34.0)
MCHC: 31.3 g/dL (ref 30.0–36.0)
MCV: 95.4 fL (ref 80.0–100.0)
Platelets: 262 10*3/uL (ref 150–400)
RBC: 3.69 MIL/uL — ABNORMAL LOW (ref 3.87–5.11)
RDW: 13 % (ref 11.5–15.5)
WBC: 6.8 10*3/uL (ref 4.0–10.5)
nRBC: 0 % (ref 0.0–0.2)

## 2023-02-16 LAB — PROCALCITONIN: Procalcitonin: <0.1 ng/mL

## 2023-02-16 LAB — BASIC METABOLIC PANEL
Anion gap: 8 (ref 5–15)
BUN: 42 mg/dL — ABNORMAL HIGH (ref 8–23)
CO2: 25 mmol/L (ref 22–32)
Calcium: 8.3 mg/dL — ABNORMAL LOW (ref 8.9–10.3)
Chloride: 108 mmol/L (ref 98–111)
Creatinine, Ser: 1.64 mg/dL — ABNORMAL HIGH (ref 0.44–1.00)
GFR, Estimated: 28 mL/min — ABNORMAL LOW (ref >60)
Glucose, Bld: 147 mg/dL — ABNORMAL HIGH (ref 70–99)
Potassium: 3.9 mmol/L (ref 3.5–5.1)
Sodium: 141 mmol/L (ref 135–145)

## 2023-02-16 LAB — ECHOCARDIOGRAM COMPLETE
AR max vel: 1.3 cm2
AV Area VTI: 1.27 cm2
AV Area mean vel: 1.18 cm2
AV Mean grad: 18.5 mm[Hg]
AV Peak grad: 32.7 mm[Hg]
Ao pk vel: 2.86 m/s
Height: 59 in
MV M vel: 3.86 m/s
MV Peak grad: 59.5 mm[Hg]
S' Lateral: 3.8 cm
Weight: 2031.76 [oz_av]

## 2023-02-16 LAB — MRSA NEXT GEN BY PCR, NASAL: MRSA by PCR Next Gen: NOT DETECTED

## 2023-02-16 LAB — MAGNESIUM: Magnesium: 2.3 mg/dL (ref 1.7–2.4)

## 2023-02-16 MED ORDER — FUROSEMIDE 10 MG/ML IJ SOLN
60.0000 mg | Freq: Once | INTRAMUSCULAR | Status: AC
  Administered 2023-02-16: 60 mg via INTRAVENOUS
  Filled 2023-02-16: qty 6

## 2023-02-16 MED ORDER — POLYETHYLENE GLYCOL 3350 17 G PO PACK: 17.0000 g | PACK | Freq: Every day | ORAL | Status: DC | PRN

## 2023-02-16 MED ORDER — ACETAMINOPHEN 650 MG RE SUPP: 650.0000 mg | Freq: Four times a day (QID) | RECTAL | Status: DC | PRN

## 2023-02-16 MED ORDER — EZETIMIBE 10 MG PO TABS
10.0000 mg | ORAL_TABLET | Freq: Every day | ORAL | Status: DC
  Administered 2023-02-16 – 2023-03-02 (×15): 10 mg via ORAL
  Filled 2023-02-16 (×15): qty 1

## 2023-02-16 MED ORDER — HYDRALAZINE HCL 10 MG PO TABS
10.0000 mg | ORAL_TABLET | Freq: Three times a day (TID) | ORAL | Status: DC
  Administered 2023-02-16 – 2023-02-17 (×2): 10 mg via ORAL
  Filled 2023-02-16 (×3): qty 1

## 2023-02-16 MED ORDER — FUROSEMIDE 10 MG/ML IJ SOLN
40.0000 mg | Freq: Two times a day (BID) | INTRAMUSCULAR | Status: DC
  Administered 2023-02-16: 40 mg via INTRAVENOUS
  Filled 2023-02-16: qty 4

## 2023-02-16 MED ORDER — ASPIRIN 81 MG PO TBEC
81.0000 mg | DELAYED_RELEASE_TABLET | Freq: Every day | ORAL | Status: DC
  Administered 2023-02-16 – 2023-03-02 (×15): 81 mg via ORAL
  Filled 2023-02-16 (×15): qty 1

## 2023-02-16 MED ORDER — DEXTROSE 5 % IV SOLN
500.0000 mg | INTRAVENOUS | Status: DC
  Filled 2023-02-16: qty 5

## 2023-02-16 MED ORDER — ACETAMINOPHEN 325 MG PO TABS
650.0000 mg | ORAL_TABLET | Freq: Four times a day (QID) | ORAL | Status: DC | PRN
  Administered 2023-02-17 – 2023-02-27 (×7): 650 mg via ORAL
  Filled 2023-02-16 (×7): qty 2

## 2023-02-16 MED ORDER — ENOXAPARIN SODIUM 40 MG/0.4ML IJ SOSY: 40.0000 mg | PREFILLED_SYRINGE | INTRAMUSCULAR | Status: DC

## 2023-02-16 MED ORDER — SODIUM CHLORIDE 0.9 % IV SOLN
500.0000 mg | INTRAVENOUS | Status: DC
  Administered 2023-02-16: 500 mg via INTRAVENOUS
  Filled 2023-02-16 (×2): qty 5

## 2023-02-16 MED ORDER — SODIUM CHLORIDE 0.9 % IV SOLN
1.0000 g | INTRAVENOUS | Status: DC
  Administered 2023-02-16: 1 g via INTRAVENOUS
  Filled 2023-02-16: qty 10

## 2023-02-16 MED ORDER — ENOXAPARIN SODIUM 30 MG/0.3ML IJ SOSY
30.0000 mg | PREFILLED_SYRINGE | INTRAMUSCULAR | Status: DC
  Administered 2023-02-17 – 2023-02-27 (×11): 30 mg via SUBCUTANEOUS
  Filled 2023-02-16 (×11): qty 0.3

## 2023-02-16 MED ORDER — PERFLUTREN LIPID MICROSPHERE
1.0000 mL | INTRAVENOUS | Status: AC | PRN
  Administered 2023-02-16: 6 mL via INTRAVENOUS

## 2023-02-16 NOTE — H&P (Signed)
History and Physical    Patient: Sara Garza OZH:[id number] DOB: 07/24/1923 DOA: 02/15/2023 DOS: the patient was seen and examined on 02/16/2023 PCP: Shirline Frees, NP  Patient coming from: Home>[facility] ER> [facility]U  Chief Complaint:  Chief Complaint  Patient presents with   Shortness of Breath   HPI: Sara Garza is a 89 y.o. female with medical history significant of congestive heart failure, preserved ejection fraction, prior known history of coronary artery disease chronic kidney disease type 2 diabetes mellitus, hypertension as well as prior transcatheter aortic valve replacement.  Patient reports that she is usually short of breath.  She gets around with a walker and just going from her bedroom to the kitchen she may have to stop once or twice because of shortness of breath.  She attributes this to her congestive heart failure.  She does not use supplementary oxygen at home.  Patient reports however for the last 4 days she has had a sensation of worsening shortness of breath.  That she was trying to monitor at home.  However it progressed such that till yesterday she was short of breath even at rest.  She does not report any chest pain or fever.  Patient has a chronic scant cough.  No change in that.  She is not reporting any marked increase in her leg swelling, although she does have some chronic leg swelling.  Patient came to the [facility] ER yesterday because of her worsening sensation of shortness of breath.  ER report was obtained from my colleague who accepted the patient to progressive care unit.  Patient is noted to be requiring 6 L/min of supplementary oxygen at this time.  Otherwise patient offers no complaints, medical evaluation is sought.   Review of Systems: As mentioned in the history of present illness. All other systems reviewed and are negative. Past Medical History:  Diagnosis Date   Arthritis    CAD (coronary artery disease)    Carotid stenosis, left     50-60% stable   Diabetes mellitus    Glaucoma    Hyperlipidemia    Hypertension    Leg pain    ABIs 2/18: normal bilaterally.   Osteoporosis    Valvular heart disease    Aortic Stenosis s/p [name] 2014 // Mitral stenosis // Echo 09/2018: EF > 65, mod LVH, severe focal basal hypertrophy, RVSP 39.8, mild to mod MR, mod to severe MS (mean 9), AVR with mild AI, severe LAE (similar to prior echo)   Past Surgical History:  Procedure Laterality Date   ANOMALOUS PULMONARY VENOUS RETURN REPAIR, TOTAL     APPENDECTOMY     CARDIAC VALVE REPLACEMENT  04/29/2012   Cataracts Bilateral    CESAREAN SECTION     FOOT SURGERY     [name]   HIP ARTHROPLASTY Right 07/31/2013   Procedure: ARTHROPLASTY BIPOLAR HIP;  Surgeon: Shelda Pal, MD;  Location: [facility] ORS;  Service: Orthopedics;  Laterality: Right;   INTRAMEDULLARY (IM) NAIL INTERTROCHANTERIC Left 01/01/2020   Procedure: INTRAMEDULLARY (IM) NAIL INTERTROCHANTRIC;  Surgeon: Samson Frederic, MD;  Location: [facility] ORS;  Service: Orthopedics;  Laterality: Left;   PERCUTANEOUS CORONARY STENT INTERVENTION (PCI-S) N/A 01/02/2012   Procedure: PERCUTANEOUS CORONARY STENT INTERVENTION (PCI-S);  Surgeon: Tonny Bollman, MD;  Location: [facility] CATH LAB;  Service: Cardiovascular;  Laterality: N/A;   Social History:  reports that she has never smoked. She has been exposed to tobacco smoke. She has never used smokeless tobacco. She reports current alcohol use. She  reports that she does not use drugs.  Allergies  Allergen Reactions   Statins Other (See Comments)    Muscle aches   Gabapentin Other (See Comments)    SOMNOLENCE    Family History  Problem Relation Age of Onset   COPD Father    Cancer Father        lung cancer   Lung cancer Other     Prior to Admission medications   Medication Sig Start Date End Date Taking? Authorizing Provider  acetaminophen (TYLENOL) 500 MG tablet Take 2 tablets (1,000 mg total) by mouth 4 (four) times daily. Patient taking  differently: Take 1,000 mg by mouth in the morning and at bedtime. 01/04/20  Yes Lewie Chamber, MD  amLODipine (NORVASC) 5 MG tablet TAKE 1 TABLET(5 MG) BY MOUTH DAILY 06/13/22  Yes Tonny Bollman, MD  amoxicillin (AMOXIL) 500 MG capsule TAKE 4 CAPSULES BY MOUTH 1 HOUR PRIOR TO DENTAL PROCEDURES 11/27/22  Yes Tonny Bollman, MD  aspirin EC 81 MG tablet Take 81 mg by mouth daily. Swallow whole.   Yes [provider]  Calcium Carb-Cholecalciferol 500-400 MG-UNIT TABS Take 1 tablet by mouth daily.   Yes [provider]  carvedilol (COREG) 3.125 MG tablet TAKE 1 TABLET(3.125 MG) BY MOUTH TWICE DAILY 06/13/22  Yes Tonny Bollman, MD  ezetimibe (ZETIA) 10 MG tablet Take 1 tablet (10 mg total) by mouth daily. 10/04/22  Yes Tonny Bollman, MD  furosemide (LASIX) 20 MG tablet Take 2 tablets (40 mg total) by mouth daily. 06/17/22  Yes Tonny Bollman, MD  Multiple Vitamin (MULTIVITAMIN) tablet Take 1 tablet by mouth daily.   Yes [provider]  Multiple Vitamins-Minerals (PRESERVISION AREDS 2+MULTI VIT PO) Take 2 capsules by mouth in the morning and at bedtime.   Yes [provider]    Physical Exam: Vitals:   02/16/23 1030 02/16/23 1212 02/16/23 1345 02/16/23 1432  BP: 133/61 (!) 123/59 (!) 122/58   Pulse: 99 94 98 (!) 101  Resp: (!) 24 18 20  (!) 24  Temp:  97.9 F (36.6 C) 97.6 F (36.4 C)   TempSrc:  Tympanic Oral   SpO2: 93% 93% 91% (!) 81%  Weight:    57.6 kg  Height:    4\' 11"  (1.499 m)   General: Patient is alert and awake and gives a coherent account of her symptoms.  Appears to be in good mood however is breathing at 24 to 28/min barely 90% on 6 L/min of supplementary oxygen.  Tachypnea is appreciable. Cardiovascular exam S1-S2 are normal, there is a murmur heard in the aortic area deep holosystolic.  No jugular venous distention Respiratory exam: Marked crackles in the left basilar and interscapular area as well as less so in the right basilar and right  interscapular area no prominent bronchial breath sounds.  No expiratory wheezes.  Good excursion Abdomen all quadrants are soft nontender Extremities 1+ edema on the right not not edema in the left.  Data Reviewed:  Labs on Admission:  Results for orders placed or performed during the hospital encounter of 02/15/23 (from the past 24 hour(s))  CBC with Differential     Status: Abnormal   Collection Time: 02/15/23  5:29 PM  Result Value Ref Range   WBC 6.0 4.0 - 10.5 K/uL   RBC 3.49 (L) 3.87 - 5.11 MIL/uL   Hemoglobin 10.8 (L) 12.0 - 15.0 g/dL   HCT 16.1 (L) 09.6 - 04.5 %   MCV 94.3 80.0 - 100.0 fL  MCH 30.9 26.0 - 34.0 pg   MCHC 32.8 30.0 - 36.0 g/dL   RDW 16.1 09.6 - 04.5 %   Platelets 250 150 - 400 K/uL   nRBC 0.0 0.0 - 0.2 %   Neutrophils Relative % 68 %   Neutro Abs 4.1 1.7 - 7.7 K/uL   Lymphocytes Relative 12 %   Lymphs Abs 0.7 0.7 - 4.0 K/uL   Monocytes Relative 16 %   Monocytes Absolute 0.9 0.1 - 1.0 K/uL   Eosinophils Relative 3 %   Eosinophils Absolute 0.2 0.0 - 0.5 K/uL   Basophils Relative 1 %   Basophils Absolute 0.0 0.0 - 0.1 K/uL   Immature Granulocytes 0 %   Abs Immature Granulocytes 0.02 0.00 - 0.07 K/uL  Comprehensive metabolic panel     Status: Abnormal   Collection Time: 02/15/23  5:29 PM  Result Value Ref Range   Sodium 141 135 - 145 mmol/L   Potassium 3.6 3.5 - 5.1 mmol/L   Chloride 104 98 - 111 mmol/L   CO2 25 22 - 32 mmol/L   Glucose, Bld 100 (H) 70 - 99 mg/dL   BUN 46 (H) 8 - 23 mg/dL   Creatinine, Ser 4.09 (H) 0.44 - 1.00 mg/dL   Calcium 8.6 (L) 8.9 - 10.3 mg/dL   Total Protein 7.2 6.5 - 8.1 g/dL   Albumin 3.9 3.5 - 5.0 g/dL   AST 13 (L) 15 - 41 U/L   ALT 9 0 - 44 U/L   Alkaline Phosphatase 65 38 - 126 U/L   Total Bilirubin 0.7 <1.2 mg/dL   GFR, Estimated 25 (L) >60 mL/min   Anion gap 12 5 - 15  Troponin I (High Sensitivity)     Status: Abnormal   Collection Time: 02/15/23  5:29 PM  Result Value Ref Range   Troponin I (High Sensitivity)  46 (H) <18 ng/L  Brain natriuretic peptide     Status: Abnormal   Collection Time: 02/15/23  5:29 PM  Result Value Ref Range   B Natriuretic Peptide 1,486.0 (H) 0.0 - 100.0 pg/mL  Resp panel by RT-PCR (RSV, Flu A&B, Covid) Anterior Nasal Swab     Status: None   Collection Time: 02/15/23  5:29 PM   Specimen: Anterior Nasal Swab  Result Value Ref Range   SARS Coronavirus 2 by RT PCR NEGATIVE NEGATIVE   Influenza A by PCR NEGATIVE NEGATIVE   Influenza B by PCR NEGATIVE NEGATIVE   Resp Syncytial Virus by PCR NEGATIVE NEGATIVE  Troponin I (High Sensitivity)     Status: Abnormal   Collection Time: 02/15/23  7:16 PM  Result Value Ref Range   Troponin I (High Sensitivity) 39 (H) <18 ng/L  I-Stat venous blood gas, ([facility] ED, [facility], [facility])     Status: Abnormal   Collection Time: 02/15/23  8:22 PM  Result Value Ref Range   pH, Ven 7.418 7.25 - 7.43   pCO2, Ven 42.5 (L) 44 - 60 mmHg   pO2, Ven 37 32 - 45 mmHg   Bicarbonate 27.4 20.0 - 28.0 mmol/L   TCO2 29 22 - 32 mmol/L   O2 Saturation 71 %   Acid-Base Excess 3.0 (H) 0.0 - 2.0 mmol/L   Sodium 142 135 - 145 mmol/L   Potassium 3.5 3.5 - 5.1 mmol/L   Calcium, Ion 1.14 (L) 1.15 - 1.40 mmol/L   HCT 41.0 36.0 - 46.0 %   Hemoglobin 13.9 12.0 - 15.0 g/dL   Collection site IV start  Drawn by Nurse    Sample type VENOUS    Comment NOTIFIED PHYSICIAN    Basic Metabolic Panel: Recent Labs  Lab 02/15/23 1729 02/15/23 2022  NA 141 142  K 3.6 3.5  CL 104  --   CO2 25  --   GLUCOSE 100*  --   BUN 46*  --   CREATININE 1.81*  --   CALCIUM 8.6*  --    Liver Function Tests: Recent Labs  Lab 02/15/23 1729  AST 13*  ALT 9  ALKPHOS 65  BILITOT 0.7  PROT 7.2  ALBUMIN 3.9   No results for input(s): "LIPASE", "AMYLASE" in the last 168 hours. No results for input(s): "AMMONIA" in the last 168 hours. CBC: Recent Labs  Lab 02/15/23 1729 02/15/23 2022  WBC 6.0  --   NEUTROABS 4.1  --   HGB 10.8* 13.9  HCT 32.9* 41.0  MCV 94.3  --   PLT  250  --    Cardiac Enzymes: Recent Labs  Lab 02/15/23 1729 02/15/23 1916  TROPONINIHS 46* 39*    BNP (last 3 results) No results for input(s): "PROBNP" in the last 8760 hours. CBG: No results for input(s): "GLUCAP" in the last 168 hours.  Radiological Exams on Admission:  DG Chest Portable 1 View  Result Date: 02/15/2023 CLINICAL DATA:  Hypoxia and shortness of breath. EXAM: PORTABLE CHEST 1 VIEW COMPARISON:  Chest CT dated 02/14/2020. FINDINGS: There is mild cardiomegaly with central vascular congestion and mild edema. Small bilateral pleural effusions and bibasilar atelectasis. Pneumonia is not excluded. No pneumothorax. Aortic valve repair. Atherosclerotic calcification of the aorta. No acute osseous pathology. IMPRESSION: Mild CHF and small bilateral pleural effusions. Electronically Signed   By: Elgie Collard M.D.   On: 02/15/2023 18:49    EKG: Independently reviewed. 02/15/2023 sinus rhythm IVCD   Assessment and Plan: * Acute on chronic diastolic CHF (congestive heart failure) (HCC) And has acute hypoxic respiratory failure requiring 6 L/min of supplementary oxygen.  Generally requires no supplementary oxygen.  Finding of marked crackles bibasilar and by interscapular area.  With only minimal edema on the leg.  Patient is fluid overloaded however I I think this is only part of the patient's problem, I am concerned about any valvular regurgitation causing patient as above finding.  Patient does not have any fever or leukocytosis.  However given her degree of hypoxemia I think we can ill afford to miss bacterial infection at this time.  I will treat the patient empirically with ceftriaxone and azithromycin, till a Pro-Cal is normal.  I will also check patient for respiratory viral panel.  I will have to hold off on patient's Coreg given her pulmonary edema.  Treat with low-dose hydralazine to reduce afterload.  I have requested Dr. Jacques Navy to help out with comanagement. Trop non  actionable.  Given asymetric leg swellin, will request USG t.r.o DVT. Echo requested.  CKD (chronic kidney disease), stage III (HCC) Creatinine appears to be at baseline. Check phos.  Essential hypertension Hold amlodipine as diastolic BP below 60.   DVT ppx with lovenox.  Past Medical History:  Diagnosis Date   Arthritis    CAD (coronary artery disease)    Carotid stenosis, left    50-60% stable   Diabetes mellitus    Glaucoma    Hyperlipidemia    Hypertension    Leg pain    ABIs 2/18: normal bilaterally.   Osteoporosis    Valvular heart disease    Aortic Stenosis  s/p [name] 2014 // Mitral stenosis // Echo 09/2018: EF > 65, mod LVH, severe focal basal hypertrophy, RVSP 39.8, mild to mod MR, mod to severe MS (mean 9), AVR with mild AI, severe LAE (similar to prior echo)     Advance Care Planning:   Code Status: Prior patient wishes to be DNR, with trial of intubation if needed.  This was discussed with the patient as well as the daughter [name] Cullifer over the phone  Consults: cardiology.  Family Communication: discussed with [name] Perryman over the phone.  Severity of Illness: The appropriate patient status for this patient is INPATIENT. Inpatient status is judged to be reasonable and necessary in order to provide the required intensity of service to ensure the patient's safety. The patient's presenting symptoms, physical exam findings, and initial radiographic and laboratory data in the context of their chronic comorbidities is felt to place them at high risk for further clinical deterioration. Furthermore, it is not anticipated that the patient will be medically stable for discharge from the hospital within 2 midnights of admission.   * I certify that at the point of admission it is my clinical judgment that the patient will require inpatient hospital care spanning beyond 2 midnights from the point of admission due to high intensity of service, high risk for further deterioration  and high frequency of surveillance required.*  Author: Nolberto Hanlon, MD 02/16/2023 3:21 PM  For on call review [email or web address].

## 2023-02-16 NOTE — ED Notes (Signed)
Patient denies pain and is resting comfortably.  

## 2023-02-16 NOTE — Progress Notes (Signed)
  Echocardiogram 2D Echocardiogram has been performed.  Sara Garza 02/16/2023, 5:44 PM

## 2023-02-16 NOTE — ED Notes (Signed)
At this time, we assist her up to a chair. She tells [place] "This feels great to get up". She remains in no distress.

## 2023-02-16 NOTE — ED Notes (Signed)
Pt provided with cereal and orange juice at this time.

## 2023-02-16 NOTE — Assessment & Plan Note (Addendum)
Continue blood pressure control with metoprolol.

## 2023-02-16 NOTE — Consult Note (Signed)
   Cardiology Consultation   Patient ID: KADEJA NEED MRN: [id number]; DOB: December 14, 1923  Admit date: 02/15/2023 Date of Consult: 02/16/2023  PCP:  Shirline Frees, NP   History of Present Illness:   Sara Garza is a 89 year old woman who I am seeing today for an evaluation of hypoxia and shortness of breath at the request of Dr. Maryjean Ka.  The patient has a history of chronic diastolic heart failure, coronary artery disease, CKD 2, diabetes, hypertension.  She has complex valve disease with aortic stenosis post [name] and mitral stenosis and regurgitation.  She is usually quite active and appears much younger than her stated age.  She is with her daughter today.  She reports that her shortness of breath began suddenly on Thursday and has persisted since that time.  She initially thought she was going to die.  She is not on supplemental oxygen at home.   Past medical, surgical, social and family history reviewed.  ROS:  Please see the history of present illness.  All other ROS reviewed and negative.     Physical Exam/Data:   Vitals:   02/16/23 1030 02/16/23 1212 02/16/23 1345 02/16/23 1432  BP: 133/61 (!) 123/59 (!) 122/58   Pulse: 99 94 98 (!) 101  Resp: (!) 24 18 20  (!) 24  Temp:  97.9 F (36.6 C) 97.6 F (36.4 C)   TempSrc:  Tympanic Oral   SpO2: 93% 93% 91% (!) 81%  Weight:    57.6 kg  Height:    4\' 11"  (1.499 m)    General: Elderly, appears younger than stated age in bed at 30 degrees Cardiac:  normal S1, S2; RRR; 2 out of 6 systolic ejection murmur.  No clear diastolic murmur Lungs: Crackles in bilateral lungs at least halfway up.  Poor aeration to bilateral bases Psych:  Normal affect   EKG:  The EKG was personally reviewed and demonstrates: Sinus rhythm with a ventricular rate of 87 bpm.  Nonspecific interventricular conduction delay Telemetry:  Telemetry was personally reviewed and demonstrates: Sinus rhythm    Assessment and Plan:   #Shortness of breath I suspect her  shortness of breath is secondary to decompensated diastolic heart failure and pulmonary edema.  I am concerned that she may have had a change in her complex valvular disease.  For now, can diurese twice daily with IV Lasix.  Plan to repeat an echo.  #Chronic diastolic heart failure NYHA class III-IV.  Diuresis as above.  #Mitral valve stenosis and regurgitation Mean gradient of 10 on January 2024 echo.  Otis Dials T. Lalla Brothers, MD, [facility], [facility] Cardiac Electrophysiology

## 2023-02-16 NOTE — Assessment & Plan Note (Addendum)
Improved volume status, renal function today with serum cr at 1,93 with K at 4,2 and serum bicarbonate at 32.  Na 142.   Plant to continue diuresis with oral torsemide Follow up renal function in 7 days as outpatient.

## 2023-02-16 NOTE — ED Notes (Signed)
She is in no distress and tells [place] she is a bit hungry, however, she doesn't want to eat "just yet".

## 2023-02-16 NOTE — Assessment & Plan Note (Deleted)
And has acute hypoxic respiratory failure requiring 6 L/min of supplementary oxygen.  Generally requires no supplementary oxygen.  Finding of marked crackles bibasilar and by interscapular area.  With only minimal edema on the leg.  Patient is fluid overloaded however I I think this is only part of the patient's problem, I am concerned about any valvular regurgitation causing patient as above finding.  Patient does not have any fever or leukocytosis.  However given her degree of hypoxemia I think we can ill afford to miss bacterial infection at this time.  I will treat the patient empirically with ceftriaxone and azithromycin, till a Pro-Cal is normal.  I will also check patient for respiratory viral panel.  I will have to hold off on patient's Coreg given her pulmonary edema.  Treat with low-dose hydralazine to reduce afterload.  I have requested Dr. Jacques Navy to help out with comanagement. Trop non actionable.  Given asymetric leg swellin, will request USG t.r.o DVT. Echo requested.

## 2023-02-16 NOTE — ED Notes (Signed)
Tequila with  cl called for transport 

## 2023-02-17 ENCOUNTER — Inpatient Hospital Stay (HOSPITAL_COMMUNITY): Payer: Medicare Other

## 2023-02-17 DIAGNOSIS — N1832 Chronic kidney disease, stage 3b: Secondary | ICD-10-CM | POA: Diagnosis not present

## 2023-02-17 DIAGNOSIS — M7989 Other specified soft tissue disorders: Secondary | ICD-10-CM | POA: Diagnosis not present

## 2023-02-17 DIAGNOSIS — J9601 Acute respiratory failure with hypoxia: Secondary | ICD-10-CM | POA: Diagnosis not present

## 2023-02-17 DIAGNOSIS — I10 Essential (primary) hypertension: Secondary | ICD-10-CM

## 2023-02-17 DIAGNOSIS — I5033 Acute on chronic diastolic (congestive) heart failure: Secondary | ICD-10-CM | POA: Diagnosis not present

## 2023-02-17 LAB — RESPIRATORY PANEL BY PCR

## 2023-02-17 LAB — CBC
HCT: 30.5 % — ABNORMAL LOW (ref 36.0–46.0)
Hemoglobin: 9.7 g/dL — ABNORMAL LOW (ref 12.0–15.0)
MCH: 30.9 pg (ref 26.0–34.0)
MCHC: 31.8 g/dL (ref 30.0–36.0)
MCV: 97.1 fL (ref 80.0–100.0)
Platelets: 227 10*3/uL (ref 150–400)
RBC: 3.14 MIL/uL — ABNORMAL LOW (ref 3.87–5.11)
RDW: 13 % (ref 11.5–15.5)
WBC: 6.3 10*3/uL (ref 4.0–10.5)
nRBC: 0 % (ref 0.0–0.2)

## 2023-02-17 LAB — BASIC METABOLIC PANEL
Anion gap: 9 (ref 5–15)
BUN: 41 mg/dL — ABNORMAL HIGH (ref 8–23)
CO2: 25 mmol/L (ref 22–32)
Calcium: 8.1 mg/dL — ABNORMAL LOW (ref 8.9–10.3)
Chloride: 108 mmol/L (ref 98–111)
Creatinine, Ser: 1.55 mg/dL — ABNORMAL HIGH (ref 0.44–1.00)
GFR, Estimated: 30 mL/min — ABNORMAL LOW (ref >60)
Glucose, Bld: 106 mg/dL — ABNORMAL HIGH (ref 70–99)
Potassium: 3.7 mmol/L (ref 3.5–5.1)
Sodium: 142 mmol/L (ref 135–145)

## 2023-02-17 LAB — PROTIME-INR
INR: 1.1 (ref 0.8–1.2)
Prothrombin Time: 14.6 s (ref 11.4–15.2)

## 2023-02-17 LAB — PHOSPHORUS: Phosphorus: 4.8 mg/dL — ABNORMAL HIGH (ref 2.5–4.6)

## 2023-02-17 LAB — HEMOGLOBIN A1C
Hgb A1c MFr Bld: 5.8 % — ABNORMAL HIGH (ref 4.8–5.6)
Mean Plasma Glucose: 119.76 mg/dL

## 2023-02-17 LAB — APTT: aPTT: 34 s (ref 24–36)

## 2023-02-17 MED ORDER — INSULIN ASPART 100 UNIT/ML IJ SOLN
0.0000 [IU] | Freq: Three times a day (TID) | INTRAMUSCULAR | Status: DC
  Administered 2023-02-18: 2 [IU] via SUBCUTANEOUS
  Administered 2023-02-19: 1 [IU] via SUBCUTANEOUS
  Administered 2023-02-24: 2 [IU] via SUBCUTANEOUS
  Administered 2023-02-25: 1 [IU] via SUBCUTANEOUS
  Administered 2023-02-27 – 2023-02-28 (×3): 2 [IU] via SUBCUTANEOUS

## 2023-02-17 MED ORDER — HYDROXYZINE HCL 10 MG PO TABS
10.0000 mg | ORAL_TABLET | Freq: Three times a day (TID) | ORAL | Status: DC | PRN
  Administered 2023-02-17: 10 mg via ORAL
  Filled 2023-02-17: qty 1

## 2023-02-17 MED ORDER — FUROSEMIDE 10 MG/ML IJ SOLN
60.0000 mg | Freq: Two times a day (BID) | INTRAMUSCULAR | Status: DC
  Administered 2023-02-17 – 2023-02-18 (×3): 60 mg via INTRAVENOUS
  Filled 2023-02-17 (×3): qty 6

## 2023-02-17 MED ORDER — AZITHROMYCIN 500 MG PO TABS
500.0000 mg | ORAL_TABLET | Freq: Every day | ORAL | Status: AC
  Administered 2023-02-17 – 2023-02-18 (×2): 500 mg via ORAL
  Filled 2023-02-17 (×2): qty 1

## 2023-02-17 NOTE — Evaluation (Signed)
Physical Therapy Evaluation Patient Details Name: Sara Garza MRN: [id number] DOB: 02/03/1924 Today's Date: 02/17/2023  History of Present Illness  Pt is 89 year old presented to [facility] ED on 02/15/23 for worsening SOB and BLE edema. Transferred to [facility] on  02/16/23. Pt with acute on chronic diastolic CHF. Pt also with severe mitral stenosis. PMH - CAD, CKD, DM, HTN, CHF, [name], Bil THR  Clinical Impression  Pt admitted with above diagnosis and presents to PT with functional limitations due to deficits listed below (See PT problem list). Pt needs skilled PT to maximize independence and safety. Pt typically lives at home alone with some intermittent assistance for bathing, cleaning, shopping etc. Currently pt with significant dyspnea with minimal activity and unable to tolerate ambulation. Will work toward improving mobility and activity tolerance as medical status allows. Patient will benefit from continued inpatient follow up therapy, <3 hours/day           If plan is discharge home, recommend the following: A lot of help with walking and/or transfers;A lot of help with bathing/dressing/bathroom;Assistance with cooking/housework;Assist for transportation;Help with stairs or ramp for entrance   Can travel by private vehicle   No    Equipment Recommendations Wheelchair (measurements PT);Wheelchair cushion (measurements PT)  Recommendations for Other Services       Functional Status Assessment Patient has had a recent decline in their functional status and demonstrates the ability to make significant improvements in function in a reasonable and predictable amount of time.     Precautions / Restrictions Precautions Precautions: Fall;Other (comment) Precaution Comments: watch SpO2 and HR      Mobility  Bed Mobility Overal bed mobility: Needs Assistance Bed Mobility: Supine to Sit     Supine to sit: Min assist, HOB elevated     General bed mobility comments: Assist to  elevate trunk into sitting and bring hips to EOB    Transfers Overall transfer level: Needs assistance Equipment used: Rolling walker (2 wheels) Transfers: Sit to/from Stand, Bed to chair/wheelchair/BSC Sit to Stand: Min assist   Step pivot transfers: Min assist       General transfer comment: Assist to bring hips up and for balance. Small steps bed to chair using walker and assist for balance and support    Ambulation/Gait                  Stairs            Wheelchair Mobility     Tilt Bed    Modified Rankin (Stroke Patients Only)       Balance Overall balance assessment: Needs assistance Sitting-balance support: No upper extremity supported, Feet supported Sitting balance-Leahy Scale: Fair     Standing balance support: Bilateral upper extremity supported, During functional activity, Reliant on assistive device for balance Standing balance-Leahy Scale: Poor Standing balance comment: walker and min assist for static standing                             Pertinent Vitals/Pain Pain Assessment Pain Assessment: No/denies pain    Home Living Family/patient expects to be discharged to:: Private residence Living Arrangements: Alone Available Help at Discharge: Family;Personal care attendant;Available PRN/intermittently Type of Home: [place] Home Access: Level entry       Home Layout: One level Home Equipment: Agricultural consultant (2 wheels);Shower seat;BSC/3in1      Prior Function Prior Level of Function : Needs assist  Physical Assist : ADLs (physical)   ADLs (physical): Bathing;IADLs Mobility Comments: Pt modified independent with rolling walker. ADLs Comments: Caregiver comes in to assist with bathing. Also has someone that comes to clean every 3 weeks.     Extremity/Trunk Assessment   Upper Extremity Assessment Upper Extremity Assessment: Defer to OT evaluation    Lower Extremity Assessment Lower Extremity Assessment:  Generalized weakness       Communication   Communication Communication: Hearing impairment  Cognition Arousal: Alert Behavior During Therapy: WFL for tasks assessed/performed Overall Cognitive Status: Within Functional Limits for tasks assessed                                          General Comments General comments (skin integrity, edema, etc.): Pt on 7-8L O2 with SpO2 85-91%. HR up to 130's briefly after pt in chair    Exercises     Assessment/Plan    PT Assessment Patient needs continued PT services  PT Problem List Decreased strength;Decreased activity tolerance;Decreased balance;Decreased mobility;Cardiopulmonary status limiting activity       PT Treatment Interventions DME instruction;Gait training;Functional mobility training;Therapeutic activities;Therapeutic exercise;Balance training;Patient/family education    PT Goals (Current goals can be found in the Care Plan section)  Acute Rehab PT Goals Patient Stated Goal: return home PT Goal Formulation: With patient Time For Goal Achievement: 03/03/23 Potential to Achieve Goals: Fair    Frequency Min 1X/week     Co-evaluation               AM-PAC PT "6 Clicks" Mobility  Outcome Measure Help needed turning from your back to your side while in a flat bed without using bedrails?: A Little Help needed moving from lying on your back to sitting on the side of a flat bed without using bedrails?: A Little Help needed moving to and from a bed to a chair (including a wheelchair)?: A Little Help needed standing up from a chair using your arms (e.g., wheelchair or bedside chair)?: A Little Help needed to walk in hospital room?: Total Help needed climbing 3-5 steps with a railing? : Total 6 Click Score: 14    End of Session Equipment Utilized During Treatment: Oxygen Activity Tolerance: Patient limited by fatigue;Treatment limited secondary to medical complications (Comment) (dyspnea) Patient left:  in chair Nurse Communication: Mobility status PT Visit Diagnosis: Unsteadiness on feet (R26.81);Other abnormalities of gait and mobility (R26.89);Muscle weakness (generalized) (M62.81)    Time: 1520-1550 PT Time Calculation (min) (ACUTE ONLY): 30 min   Charges:   PT Evaluation $PT Eval Moderate Complexity: 1 Mod PT Treatments $Therapeutic Activity: 8-22 mins PT General Charges $$ ACUTE PT VISIT: 1 Visit         [facility] PT Acute Rehabilitation Services Office [phone]   Angelina Ok [facility] 02/17/2023, 4:04 PM

## 2023-02-17 NOTE — Progress Notes (Signed)
[facility]                                                                               [name], is a 89 y.o. female, DOB - Oct 03, 1923, RUE:[id number] Admit date - 02/15/2023    Outpatient Primary MD for the patient is Nafziger, Kandee Keen, NP  LOS - 1  days    Brief summary   Sara Garza is a 89 y.o. female with medical history significant of congestive heart failure, preserved ejection fraction, prior known history of coronary artery disease chronic kidney disease type 2 diabetes mellitus, hypertension as well as prior transcatheter aortic valve replacement.  Patient reports that she is usually short of breath.   Assessment & Plan    Assessment and Plan: * Acute on chronic diastolic CHF (congestive heart failure) (HCC)  Improving, diuresing well with IV lasix 40 mg BID.  Echocardiogram ordered.  Cardiology on board and appreciate recommendations.  Strict intake and output and daily weights.   Acute respiratory failure with hypoxia:  Probably sec to the above and acute bronchitis.  Resume azithromycin.   CKD (chronic kidney disease), stage III (HCC) Creatinine appears to be at baseline.   Essential hypertension Optimal BP parameters.     H/p [name] Monitor.    Severe to moderate mitral valve stenosis Unfortunately not a candidate for intervention.  Monitor.   Type 2 DM CBG (last 3)  No results for input(s): "GLUCAP" in the last 72 hours.       Estimated body mass index is 25.47 kg/m as calculated from the following:   Height as of this encounter: 4\' 11"  (1.499 m).   Weight as of this encounter: 57.2 kg.  Code Status: DNR INTERVENTIONS DESIRED. MAY INTUBATE.  DVT Prophylaxis:  enoxaparin (LOVENOX) injection 30 mg Start: 02/17/23 0800 SCDs Start: 02/16/23 1523   Level of Care: Level of care: Progressive Family Communication: none at bedside.   Disposition Plan:     Remains inpatient appropriate:  pending improvement.   Procedures:   echo  Consultants:   Cardiology.   Antimicrobials:   Anti-infectives (From admission, onward)    Start     Dose/Rate Route Frequency Ordered Stop   02/17/23 1700  azithromycin (ZITHROMAX) tablet 500 mg        500 mg Oral Daily 02/17/23 1031 02/19/23 1659   02/16/23 1630  azithromycin (ZITHROMAX) 500 mg in sodium chloride 0.9 % 250 mL IVPB  Status:  Discontinued        500 mg 255 mL/hr over 60 Minutes Intravenous Every 24 hours 02/16/23 1539 02/17/23 1031   02/16/23 1615  cefTRIAXone (ROCEPHIN) 1 g in sodium chloride 0.9 % 100 mL IVPB  Status:  Discontinued        1 g 200 mL/hr over 30 Minutes Intravenous Every 24 hours 02/16/23 1529 02/17/23 1030   02/16/23 1615  azithromycin (ZITHROMAX) 500 mg in dextrose 5 % 250 mL IVPB  Status:  Discontinued        500 mg 250 mL/hr over 60 Minutes Intravenous Every 24 hours 02/16/23 1529 02/16/23 1539        Medications  Scheduled Meds:  aspirin EC  81 mg Oral Daily   azithromycin  500 mg Oral Daily   enoxaparin (LOVENOX) injection  30 mg Subcutaneous Q24H   ezetimibe  10 mg Oral Daily   furosemide  60 mg Intravenous BID   Continuous Infusions: PRN Meds:.acetaminophen **OR** acetaminophen, hydrOXYzine, polyethylene glycol    Subjective:   Sara Garza was seen and examined today. Reports breathing has improved.   Objective:   Vitals:   02/17/23 0431 02/17/23 0500 02/17/23 0723 02/17/23 1138  BP: (!) 132/47  100/81 100/82  Pulse: 78 77 89 (!) 101  Resp: 19 17 20  (!) 22  Temp:   97.8 F (36.6 C) 98.1 F (36.7 C)  TempSrc:   Oral Oral  SpO2: 93% 94% 94% (!) 89%  Weight:  57.2 kg    Height:        Intake/Output Summary (Last 24 hours) at 02/17/2023 1251 Last data filed at 02/17/2023 1100 Gross per 24 hour  Intake 342.83 ml  Output 1200 ml  Net -857.17 ml   Filed Weights   02/16/23 1432 02/17/23 0500  Weight: 57.6 kg 57.2 kg     Exam General: Alert and oriented x 3, NAD Cardiovascular: S1 S2 auscultated, no  murmurs, RRR Respiratory: Clear to auscultation bilaterally, no wheezing, rales or rhonchi Gastrointestinal: Soft, nontender, nondistended, + bowel sounds Ext: no pedal edema bilaterally Neuro: AAOx3, Cr N's II- XII. Strength 5/5 upper and lower extremities bilaterally Skin: No rashes Psych: Normal affect and demeanor, alert and oriented x3    Data Reviewed:  I have personally reviewed following labs and imaging studies   CBC Lab Results  Component Value Date   WBC 6.3 02/17/2023   RBC 3.14 (L) 02/17/2023   HGB 9.7 (L) 02/17/2023   HCT 30.5 (L) 02/17/2023   MCV 97.1 02/17/2023   MCH 30.9 02/17/2023   PLT 227 02/17/2023   MCHC 31.8 02/17/2023   RDW 13.0 02/17/2023   LYMPHSABS 0.7 02/15/2023   MONOABS 0.9 02/15/2023   EOSABS 0.2 02/15/2023   BASOSABS 0.0 02/15/2023     Last metabolic panel Lab Results  Component Value Date   NA 142 02/17/2023   K 3.7 02/17/2023   CL 108 02/17/2023   CO2 25 02/17/2023   BUN 41 (H) 02/17/2023   CREATININE 1.55 (H) 02/17/2023   GLUCOSE 106 (H) 02/17/2023   GFRNONAA 30 (L) 02/17/2023   GFRAA 27 (L) 03/07/2020   CALCIUM 8.1 (L) 02/17/2023   PHOS 4.8 (H) 02/17/2023   PROT 7.2 02/15/2023   ALBUMIN 3.9 02/15/2023   BILITOT 0.7 02/15/2023   ALKPHOS 65 02/15/2023   AST 13 (L) 02/15/2023   ALT 9 02/15/2023   ANIONGAP 9 02/17/2023    CBG (last 3)  No results for input(s): "GLUCAP" in the last 72 hours.    Coagulation Profile: Recent Labs  Lab 02/17/23 0312  INR 1.1     Radiology Studies: ECHOCARDIOGRAM COMPLETE  Result Date: 02/16/2023    ECHOCARDIOGRAM REPORT   Patient Name:   Sara Garza Date of Exam: 02/16/2023 Medical Rec #:  [id number]      Height:       59.0 in Accession #:    [id number]     Weight:       127.0 lb Date of Birth:  Apr 18, 1923       BSA:          1.521 m Patient Age:    89 years       BP:  130/70 mmHg Patient Gender: F              HR:           92 bpm. Exam Location:  Inpatient Procedure: 2D Echo,  Cardiac Doppler and Color Doppler STAT ECHO Indications:    acute systolic chf.  History:        Patient has prior history of Echocardiogram examinations, most                 recent 04/15/2022. Chronic kidney disease, Arrythmias:LBBB; Risk                 Factors:Hypertension and Dyslipidemia.                 Aortic Valve: 23 mm Medtronic stented ([name]) valve is present in                 the aortic position. Procedure Date: 2014.  Sonographer:    Delcie Roch RDCS Referring Phys: [id number] Lanier Prude  Sonographer Comments: Image acquisition challenging due to respiratory motion. IMPRESSIONS  1. Left ventricular ejection fraction, by estimation, is 50 to 55%. The left ventricle has low normal function. The left ventricle demonstrates regional wall motion abnormalities- aneurysmal mid ventricular septum with no mural thrombus and stable apperance. There is moderate left ventricular hypertrophy. Left ventricular diastolic parameters are indeterminate.  2. Right ventricular systolic function is normal. The right ventricular size is normal. There is mildly elevated pulmonary artery systolic pressure. The estimated right ventricular systolic pressure is 43.8 mmHg.  3. Left atrial size was severely dilated.  4. The mitral valve is degenerative. Mild to moderate mitral valve regurgitation. Severe mitral stenosis. The mean mitral valve gradient is 13.8 mmHg with average heart rate of 89 bpm. Severe mitral annular calcification.  5. The aortic valve has been repaired/replaced. Aortic valve regurgitation is mild. There is a 23 mm Medtronic stented ([name]) valve present in the aortic position. Procedure Date: 2014. Aortic valve area, by VTI measures 1.27 cm. Aortic valve mean gradient measures 18.5 mmHg. Aortic valve Vmax measures 2.86 m/s. Aortic valve acceleration time measures 79 msec, AV prosthesis is grossly stable, DVI 0.37.  6. The inferior vena cava is normal in size with <50% respiratory variability,  suggesting right atrial pressure of 8 mmHg. FINDINGS  Left Ventricle: Left ventricular ejection fraction, by estimation, is 50 to 55%. The left ventricle has low normal function. The left ventricle demonstrates regional wall motion abnormalities. The left ventricular internal cavity size was normal in size. There is moderate left ventricular hypertrophy. Left ventricular diastolic parameters are indeterminate.  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 Right Ventricle: The right ventricular size is normal. No increase in right ventricular wall thickness. Right ventricular systolic function is normal. There is mildly elevated pulmonary artery systolic pressure. The tricuspid regurgitant velocity is 2.99  m/s, and with an assumed right atrial pressure of 8 mmHg, the estimated right ventricular systolic pressure is 43.8 mmHg. Left Atrium: Left atrial size was severely dilated. Right Atrium: Right atrial size was normal in size. Pericardium: Trivial pericardial effusion is present. Presence of epicardial fat layer. Mitral Valve: The mitral valve is degenerative in appearance. Severe mitral annular calcification. Mild to moderate mitral valve regurgitation. Severe mitral valve stenosis. MV peak gradient, 27.4 mmHg. The mean mitral valve gradient is 13.8 mmHg with average heart rate of 89 bpm. Tricuspid Valve: The tricuspid valve is normal in structure. Tricuspid valve regurgitation is trivial. No  evidence of tricuspid stenosis. Aortic Valve: The aortic valve has been repaired/replaced. Aortic valve regurgitation is mild. Aortic valve mean gradient measures 18.5 mmHg. Aortic valve peak gradient measures 32.7 mmHg. Aortic valve area, by VTI measures 1.27 cm. There is a 23 mm Medtronic stented ([name]) valve present in the aortic position. Procedure Date: 2014. Pulmonic Valve: The pulmonic valve was normal in structure. Pulmonic valve regurgitation is mild. No evidence of pulmonic stenosis. Aorta: The  aortic root is normal in size and structure. Venous: The inferior vena cava is normal in size with less than 50% respiratory variability, suggesting right atrial pressure of 8 mmHg. IAS/Shunts: The atrial septum is grossly normal.  LEFT VENTRICLE PLAX 2D LVIDd:         5.10 cm LVIDs:         3.80 cm LV PW:         1.10 cm LV IVS:        1.10 cm LVOT diam:     2.10 cm LV SV:         71 LV SV Index:   47 LVOT Area:     3.46 cm  RIGHT VENTRICLE             IVC RV Basal diam:  2.70 cm     IVC diam: 1.90 cm RV S prime:     10.30 cm/s TAPSE (M-mode): 1.4 cm LEFT ATRIUM              Index        RIGHT ATRIUM           Index LA diam:        4.80 cm  3.16 cm/m   RA Area:     13.20 cm LA Vol (A2C):   89.6 ml  58.93 ml/m  RA Volume:   31.50 ml  20.72 ml/m LA Vol (A4C):   123.0 ml 80.89 ml/m LA Biplane Vol: 113.0 ml 74.31 ml/m  AORTIC VALVE AV Area (Vmax):    1.30 cm AV Area (Vmean):   1.18 cm AV Area (VTI):     1.27 cm AV Vmax:           285.76 cm/s AV Vmean:          216.081 cm/s AV VTI:            0.562 m AV Peak Grad:      32.7 mmHg AV Mean Grad:      18.5 mmHg LVOT Vmax:         107.25 cm/s LVOT Vmean:        73.575 cm/s LVOT VTI:          0.206 m LVOT/AV VTI ratio: 0.37  AORTA Ao Root diam: 2.80 cm Ao Asc diam:  2.80 cm MITRAL VALVE              TRICUSPID VALVE MV Peak grad: 27.4 mmHg   TR Peak grad:   35.8 mmHg MV Mean grad: 13.8 mmHg   TR Vmax:        299.00 cm/s MV Vmax:      2.62 m/s MV Vmean:     180.5 cm/s  SHUNTS MR Peak grad: 59.5 mmHg   Systemic VTI:  0.21 m MR Mean grad: 44.0 mmHg   Systemic Diam: 2.10 cm MR Vmax:      385.67 cm/s MR Vmean:     267.0 cm/s Weston Brass MD Electronically signed by Weston Brass MD Signature Date/Time: 02/16/2023/6:08:12 PM  Final    DG Chest Portable 1 View  Result Date: 02/15/2023 CLINICAL DATA:  Hypoxia and shortness of breath. EXAM: PORTABLE CHEST 1 VIEW COMPARISON:  Chest CT dated 02/14/2020. FINDINGS: There is mild cardiomegaly with central vascular  congestion and mild edema. Small bilateral pleural effusions and bibasilar atelectasis. Pneumonia is not excluded. No pneumothorax. Aortic valve repair. Atherosclerotic calcification of the aorta. No acute osseous pathology. IMPRESSION: Mild CHF and small bilateral pleural effusions. Electronically Signed   By: Elgie Collard M.D.   On: 02/15/2023 18:49       Kathlen Mody M.D. [facility] 02/17/2023, 12:51 PM  Available via Epic secure chat 7am-7pm After 7 pm, please refer to night coverage provider listed on amion.

## 2023-02-17 NOTE — Progress Notes (Signed)
Rounding Note    Patient Name: Sara Garza Date of Encounter: 02/17/2023  [facility] Cardiologist: Tonny Bollman, MD   Subjective   Pt breathing some better.   Still appears SOB with talking   Denies CP   Inpatient Medications    Scheduled Meds:  aspirin EC  81 mg Oral Daily   enoxaparin (LOVENOX) injection  30 mg Subcutaneous Q24H   ezetimibe  10 mg Oral Daily   furosemide  40 mg Intravenous BID   hydrALAZINE  10 mg Oral Q8H   Continuous Infusions:  azithromycin (ZITHROMAX) 500 mg in sodium chloride 0.9 % 250 mL IVPB 255 mL/hr at 02/16/23 1703   cefTRIAXone (ROCEPHIN)  IV Stopped (02/16/23 1649)   PRN Meds: acetaminophen **OR** acetaminophen, polyethylene glycol   Vital Signs    Vitals:   02/17/23 0301 02/17/23 0431 02/17/23 0500 02/17/23 0723  BP: (!) 105/53 (!) 132/47  100/81  Pulse: 84 78 77 89  Resp: 20 19 17 20   Temp: 97.8 F (36.6 C)   97.8 F (36.6 C)  TempSrc: Oral   Oral  SpO2: 99% 93% 94% 94%  Weight:   57.2 kg   Height:        Intake/Output Summary (Last 24 hours) at 02/17/2023 0909 Last data filed at 02/17/2023 0600 Gross per 24 hour  Intake 342.83 ml  Output 900 ml  Net -557.17 ml    Net neg 1.4 L        02/17/2023    5:00 AM 02/16/2023    2:32 PM 01/21/2023    2:14 PM  Last 3 Weights  Weight (lbs) 126 lb 1.7 oz 126 lb 15.8 oz 128 lb  Weight (kg) 57.2 kg 57.6 kg 58.06 kg      Telemetry    SR with PACs, PVCs  - Personally Reviewed  ECG    NO new - Personally Reviewed  Physical Exam   GEN: No acute distress.   Neck: JVP is increased   Cardiac: RRR,  II/VI systolic murmur LSB    Respiratory: Rales bilaterally   1/2 up    GI: Soft, nontender, non-distended  Ext   NO LE edema   Labs    High Sensitivity Troponin:   Recent Labs  Lab 02/15/23 1729 02/15/23 1916  TROPONINIHS 46* 39*     Chemistry Recent Labs  Lab 02/15/23 1729 02/15/23 2022 02/16/23 1622 02/17/23 0312  NA 141 142 141 142  K 3.6  3.5 3.9 3.7  CL 104  --  108 108  CO2 25  --  25 25  GLUCOSE 100*  --  147* 106*  BUN 46*  --  42* 41*  CREATININE 1.81*  --  1.64* 1.55*  CALCIUM 8.6*  --  8.3* 8.1*  MG  --   --  2.3  --   PROT 7.2  --   --   --   ALBUMIN 3.9  --   --   --   AST 13*  --   --   --   ALT 9  --   --   --   ALKPHOS 65  --   --   --   BILITOT 0.7  --   --   --   GFRNONAA 25*  --  28* 30*  ANIONGAP 12  --  8 9    Lipids No results for input(s): "CHOL", "TRIG", "HDL", "LABVLDL", "LDLCALC", "CHOLHDL" in the last 168 hours.  Hematology Recent Labs  Lab 02/15/23 1729 02/15/23 2022 02/16/23 1622 02/17/23 0312  WBC 6.0  --  6.8 6.3  RBC 3.49*  --  3.69* 3.14*  HGB 10.8* 13.9 11.0* 9.7*  HCT 32.9* 41.0 35.2* 30.5*  MCV 94.3  --  95.4 97.1  MCH 30.9  --  29.8 30.9  MCHC 32.8  --  31.3 31.8  RDW 13.4  --  13.0 13.0  PLT 250  --  262 227   Thyroid No results for input(s): "TSH", "FREET4" in the last 168 hours.  BNP Recent Labs  Lab 02/15/23 1729  BNP 1,486.0*    DDimer No results for input(s): "DDIMER" in the last 168 hours.   Radiology    ECHOCARDIOGRAM COMPLETE  Result Date: 02/16/2023    ECHOCARDIOGRAM REPORT   Patient Name:   Sara Garza Date of Exam: 02/16/2023 Medical Rec #:  [id number]      Height:       59.0 in Accession #:    [id number]     Weight:       127.0 lb Date of Birth:  22-Sep-1923       BSA:          1.521 m Patient Age:    89 years       BP:           130/70 mmHg Patient Gender: F              HR:           92 bpm. Exam Location:  Inpatient Procedure: 2D Echo, Cardiac Doppler and Color Doppler STAT ECHO Indications:    acute systolic chf.  History:        Patient has prior history of Echocardiogram examinations, most                 recent 04/15/2022. Chronic kidney disease, Arrythmias:LBBB; Risk                 Factors:Hypertension and Dyslipidemia.                 Aortic Valve: 23 mm Medtronic stented ([name]) valve is present in                 the aortic position. Procedure  Date: 2014.  Sonographer:    Delcie Roch RDCS Referring Phys: [id number] Lanier Prude  Sonographer Comments: Image acquisition challenging due to respiratory motion. IMPRESSIONS  1. Left ventricular ejection fraction, by estimation, is 50 to 55%. The left ventricle has low normal function. The left ventricle demonstrates regional wall motion abnormalities- aneurysmal mid ventricular septum with no mural thrombus and stable apperance. There is moderate left ventricular hypertrophy. Left ventricular diastolic parameters are indeterminate.  2. Right ventricular systolic function is normal. The right ventricular size is normal. There is mildly elevated pulmonary artery systolic pressure. The estimated right ventricular systolic pressure is 43.8 mmHg.  3. Left atrial size was severely dilated.  4. The mitral valve is degenerative. Mild to moderate mitral valve regurgitation. Severe mitral stenosis. The mean mitral valve gradient is 13.8 mmHg with average heart rate of 89 bpm. Severe mitral annular calcification.  5. The aortic valve has been repaired/replaced. Aortic valve regurgitation is mild. There is a 23 mm Medtronic stented ([name]) valve present in the aortic position. Procedure Date: 2014. Aortic valve area, by VTI measures 1.27 cm. Aortic valve mean gradient measures 18.5 mmHg. Aortic valve Vmax measures 2.86 m/s. Aortic valve acceleration time measures 79 msec, AV  prosthesis is grossly stable, DVI 0.37.  6. The inferior vena cava is normal in size with <50% respiratory variability, suggesting right atrial pressure of 8 mmHg. FINDINGS  Left Ventricle: Left ventricular ejection fraction, by estimation, is 50 to 55%. The left ventricle has low normal function. The left ventricle demonstrates regional wall motion abnormalities. The left ventricular internal cavity size was normal in size. There is moderate left ventricular hypertrophy. Left ventricular diastolic parameters are indeterminate.  LV Wall  Scoring: The mid anteroseptal segment is aneurysmal. Right Ventricle: The right ventricular size is normal. No increase in right ventricular wall thickness. Right ventricular systolic function is normal. There is mildly elevated pulmonary artery systolic pressure. The tricuspid regurgitant velocity is 2.99  m/s, and with an assumed right atrial pressure of 8 mmHg, the estimated right ventricular systolic pressure is 43.8 mmHg. Left Atrium: Left atrial size was severely dilated. Right Atrium: Right atrial size was normal in size. Pericardium: Trivial pericardial effusion is present. Presence of epicardial fat layer. Mitral Valve: The mitral valve is degenerative in appearance. Severe mitral annular calcification. Mild to moderate mitral valve regurgitation. Severe mitral valve stenosis. MV peak gradient, 27.4 mmHg. The mean mitral valve gradient is 13.8 mmHg with average heart rate of 89 bpm. Tricuspid Valve: The tricuspid valve is normal in structure. Tricuspid valve regurgitation is trivial. No evidence of tricuspid stenosis. Aortic Valve: The aortic valve has been repaired/replaced. Aortic valve regurgitation is mild. Aortic valve mean gradient measures 18.5 mmHg. Aortic valve peak gradient measures 32.7 mmHg. Aortic valve area, by VTI measures 1.27 cm. There is a 23 mm Medtronic stented ([name]) valve present in the aortic position. Procedure Date: 2014. Pulmonic Valve: The pulmonic valve was normal in structure. Pulmonic valve regurgitation is mild. No evidence of pulmonic stenosis. Aorta: The aortic root is normal in size and structure. Venous: The inferior vena cava is normal in size with less than 50% respiratory variability, suggesting right atrial pressure of 8 mmHg. IAS/Shunts: The atrial septum is grossly normal.  LEFT VENTRICLE PLAX 2D LVIDd:         5.10 cm LVIDs:         3.80 cm LV PW:         1.10 cm LV IVS:        1.10 cm LVOT diam:     2.10 cm LV SV:         71 LV SV Index:   47 LVOT Area:     3.46  cm  RIGHT VENTRICLE             IVC RV Basal diam:  2.70 cm     IVC diam: 1.90 cm RV S prime:     10.30 cm/s TAPSE (M-mode): 1.4 cm LEFT ATRIUM              Index        RIGHT ATRIUM           Index LA diam:        4.80 cm  3.16 cm/m   RA Area:     13.20 cm LA Vol (A2C):   89.6 ml  58.93 ml/m  RA Volume:   31.50 ml  20.72 ml/m LA Vol (A4C):   123.0 ml 80.89 ml/m LA Biplane Vol: 113.0 ml 74.31 ml/m  AORTIC VALVE AV Area (Vmax):    1.30 cm AV Area (Vmean):   1.18 cm AV Area (VTI):     1.27 cm AV Vmax:  285.76 cm/s AV Vmean:          216.081 cm/s AV VTI:            0.562 m AV Peak Grad:      32.7 mmHg AV Mean Grad:      18.5 mmHg LVOT Vmax:         107.25 cm/s LVOT Vmean:        73.575 cm/s LVOT VTI:          0.206 m LVOT/AV VTI ratio: 0.37  AORTA Ao Root diam: 2.80 cm Ao Asc diam:  2.80 cm MITRAL VALVE              TRICUSPID VALVE MV Peak grad: 27.4 mmHg   TR Peak grad:   35.8 mmHg MV Mean grad: 13.8 mmHg   TR Vmax:        299.00 cm/s MV Vmax:      2.62 m/s MV Vmean:     180.5 cm/s  SHUNTS MR Peak grad: 59.5 mmHg   Systemic VTI:  0.21 m MR Mean grad: 44.0 mmHg   Systemic Diam: 2.10 cm MR Vmax:      385.67 cm/s MR Vmean:     267.0 cm/s Weston Brass MD Electronically signed by Weston Brass MD Signature Date/Time: 02/16/2023/6:08:12 PM    Final    DG Chest Portable 1 View  Result Date: 02/15/2023 CLINICAL DATA:  Hypoxia and shortness of breath. EXAM: PORTABLE CHEST 1 VIEW COMPARISON:  Chest CT dated 02/14/2020. FINDINGS: There is mild cardiomegaly with central vascular congestion and mild edema. Small bilateral pleural effusions and bibasilar atelectasis. Pneumonia is not excluded. No pneumothorax. Aortic valve repair. Atherosclerotic calcification of the aorta. No acute osseous pathology. IMPRESSION: Mild CHF and small bilateral pleural effusions. Electronically Signed   By: Elgie Collard M.D.   On: 02/15/2023 18:49    Cardiac Studies    1. Left ventricular ejection fraction, by  estimation, is 50 to 55%. The  left ventricle has low normal function. The left ventricle demonstrates  regional wall motion abnormalities- aneurysmal mid ventricular septum with  no mural thrombus and stable  apperance. There is moderate left ventricular hypertrophy. Left  ventricular diastolic parameters are indeterminate.   2. Right ventricular systolic function is normal. The right ventricular  size is normal. There is mildly elevated pulmonary artery systolic  pressure. The estimated right ventricular systolic pressure is 43.8 mmHg.   3. Left atrial size was severely dilated.   4. The mitral valve is degenerative. Mild to moderate mitral valve  regurgitation. Severe mitral stenosis. The mean mitral valve gradient is  13.8 mmHg with average heart rate of 89 bpm. Severe mitral annular  calcification.   5. The aortic valve has been repaired/replaced. Aortic valve  regurgitation is mild. There is a 23 mm Medtronic stented ([name]) valve  present in the aortic position. Procedure Date: 2014. Aortic valve area,  by VTI measures 1.27 cm. Aortic valve mean  gradient measures 18.5 mmHg. Aortic valve Vmax measures 2.86 m/s. Aortic  valve acceleration time measures 79 msec, AV prosthesis is grossly stable,  DVI 0.37.   6. The inferior vena cava is normal in size with <50% respiratory  variability, suggesting right atrial pressure of 8 mmHg.    Patient Profile   89 yo with hx of CAD, HFpEF, CKD2, DM, HTN.   She is s/p [name] for AS  Hx MS and MR    Admitted 02/15/23 for sudden onset SOB since Thursday.  Assessment & Plan    1  MV dz  PT with severe mitral stenosis   Mean gradient through the valve 13.8 mm HG  There is probably moderate MR      The mitral stenosis hs been known to be severe    REviewed with patient Unfortunately she is not a candidate for intervention for this    COntinue IV diuresis    As BP allows would use metoprolol for HR control, allow more diastolic filling      2   AV dz  Pt is s/p [name] in 2015 with 23 mm Medtronic Corevalve  Mean gradient is 18.5  Stable from previous echo in 04/2022      3  HFpEF  Pt remains volume overloaded     Continue diuresis   4  CAD  REmote intervention   Denies CP  Trop 46, 39      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

## 2023-02-17 NOTE — Progress Notes (Signed)
Respiratory panel negative, MD aware. Droplet isolation d/ced.

## 2023-02-17 NOTE — Progress Notes (Signed)
PT Cancellation Note  Patient Details Name: KORINNA BUMGARDNER MRN: [id number] DOB: [birth year]   Cancelled Treatment:    Reason Eval/Treat Not Completed: Patient not medically ready. Per nursing pt too dyspneic at this time to participate. Will continue attempts.   Angelina Ok [facility] 02/17/2023, 11:31 AM Skip Mayer PT Acute [employer] [phone]

## 2023-02-17 NOTE — TOC Initial Note (Signed)
Transition of Care [facility]) - Initial/Assessment Note    Patient Details  Name: Sara Garza MRN: [id number] Date of Birth: 02/25/1924  [facility] CM/SW Contact:    Harriet Masson, RN Phone Number: 02/17/2023, 2:33 PM  Clinical Narrative:                  Spoke to patient and caregiver at bedside regarding transition needs. Patient has walker and caregiver comes 1 X week to help with meals. Patient states she has used [facility] in the past but would like to use a different Home health agency if home health is recommended.  Medicare.gov list given to patient and patient choose [facility]. Patient is currently on 7L 02. TOC will follow for home 02 needs. Patient has family or caregiver support to go to apts. Address, Phone number and PCP verified.  TOC following.   Expected Discharge Plan: Home w Home Health Services Barriers to Discharge: Continued Medical Work up   Patient Goals and CMS Choice Patient states their goals for this hospitalization and ongoing recovery are:: return home CMS Medicare.gov Compare Post Acute Care list provided to:: Patient Choice offered to / list presented to : Patient      Expected Discharge Plan and Services   Discharge Planning Services: CM Consult Post Acute Care Choice: Home Health Living arrangements for the past 2 months: Single Family Home                                      Prior Living Arrangements/Services Living arrangements for the past 2 months: Single Family Home Lives with:: Self Patient language and need for interpreter reviewed:: Yes Do you feel safe going back to the place where you live?: Yes      Need for Family Participation in Patient Care: Yes (Comment) Care giver support system in place?: Yes (comment)   Criminal Activity/Legal Involvement Pertinent to Current Situation/Hospitalization: No - Comment as needed  Activities of Daily Living   ADL Screening (condition at time of  admission) Independently performs ADLs?: Yes (appropriate for developmental age) Is the patient deaf or have difficulty hearing?: No Does the patient have difficulty seeing, even when wearing glasses/contacts?: No Does the patient have difficulty concentrating, remembering, or making decisions?: No  Permission Sought/Granted                  Emotional Assessment Appearance:: Appears younger than stated age Attitude/Demeanor/Rapport: Gracious Affect (typically observed): Accepting Orientation: : Oriented to Self, Oriented to Place, Oriented to  Time, Oriented to Situation Alcohol / Substance Use: Not Applicable Psych Involvement: No (comment)  Admission diagnosis:  Acute respiratory failure with hypoxia (HCC) [J96.01] Acute on chronic diastolic CHF (congestive heart failure) (HCC) [I50.33] Acute on chronic congestive heart failure, unspecified heart failure type [facility]) [I50.9] Patient Active Problem List   Diagnosis Date Noted   Trigger finger, acquired 12/19/2021   AC (acromioclavicular) arthritis 06/27/2021   Right rotator cuff tear arthropathy 05/16/2021   Acute CHF (congestive heart failure) (HCC) 02/14/2020   Acute blood loss anemia 01/03/2020   Eye irritation 01/01/2020   Mitral valve stenosis    Benign essential HTN    Chronic diastolic CHF (congestive heart failure) (HCC)    Hip fracture (HCC) 12/30/2019   Closed fracture of femur, intertrochanteric, left, initial encounter (HCC)    Acute on chronic diastolic CHF (congestive heart failure) (HCC) 02/25/2018  S/P [name] (transcatheter aortic valve replacement) 02/25/2018   Leg pain    Hyperlipidemia    Glaucoma    Carotid stenosis, left    Arthritis    Urinary incontinence 02/12/2017   Chronic bilateral low back pain 08/12/2016   Spondylosis of lumbar joint 08/12/2016   Bilateral leg pain 08/12/2016   Neck mass 04/15/2015   Status post hip hemiarthroplasty 07/31/2013   CAD (coronary artery disease) 07/31/2013    CKD (chronic kidney disease), stage III (HCC) 07/31/2013   Left bundle branch block 07/31/2013   Severe calcific aortic stenosis 06/18/2010   Diabetic polyneuropathy (HCC) 04/16/2010   GLAUCOMA 01/06/2009   CHEST PAIN, ATYPICAL 12/16/2008   Bilateral shoulder pain 11/16/2007   Osteoarthritis 02/02/2007   Type 2 diabetes mellitus with renal manifestations, controlled (HCC) 10/24/2006   Dyslipidemia 10/02/2006   Essential hypertension 10/02/2006   PCP:  Shirline Frees, NP Pharmacy:   [facility] [place], [place] - [address] [place] [address] Phone: [phone] Fax: [phone]     Social Determinants of Health (SDOH) Social History: SDOH Screenings   Food Insecurity: No Food Insecurity (02/16/2023)  Housing: Low Risk  (02/16/2023)  Transportation Needs: No Transportation Needs (02/16/2023)  Utilities: Not At Risk (02/16/2023)  Alcohol Screen: Low Risk  (05/22/2022)  Depression (PHQ2-9): Low Risk  (05/22/2022)  Financial Resource Strain: Low Risk  (05/22/2022)  Physical Activity: Inactive (05/22/2022)  Social Connections: Moderately Integrated (05/22/2022)  Stress: No Stress Concern Present (05/22/2022)  Tobacco Use: Medium Risk (02/15/2023)   SDOH Interventions:     Readmission Risk Interventions     No data to display

## 2023-02-17 NOTE — Progress Notes (Signed)
Short of breath, ronchi all lobes, Resp- 33 . Sat-85 on 4lHFNC. Lasix given, increased O2 7LHFNC.. sat- 91 %. Continue to monitor.

## 2023-02-18 DIAGNOSIS — I5033 Acute on chronic diastolic (congestive) heart failure: Secondary | ICD-10-CM | POA: Diagnosis not present

## 2023-02-18 DIAGNOSIS — Z515 Encounter for palliative care: Secondary | ICD-10-CM

## 2023-02-18 DIAGNOSIS — I10 Essential (primary) hypertension: Secondary | ICD-10-CM | POA: Diagnosis not present

## 2023-02-18 DIAGNOSIS — Z7189 Other specified counseling: Secondary | ICD-10-CM | POA: Diagnosis not present

## 2023-02-18 DIAGNOSIS — J9601 Acute respiratory failure with hypoxia: Secondary | ICD-10-CM | POA: Diagnosis not present

## 2023-02-18 DIAGNOSIS — N1832 Chronic kidney disease, stage 3b: Secondary | ICD-10-CM | POA: Diagnosis not present

## 2023-02-18 LAB — BASIC METABOLIC PANEL
Anion gap: 11 (ref 5–15)
Anion gap: 11 (ref 5–15)
BUN: 46 mg/dL — ABNORMAL HIGH (ref 8–23)
BUN: 54 mg/dL — ABNORMAL HIGH (ref 8–23)
CO2: 28 mmol/L (ref 22–32)
CO2: 26 mmol/L (ref 22–32)
Calcium: 8.4 mg/dL — ABNORMAL LOW (ref 8.9–10.3)
Calcium: 8.4 mg/dL — ABNORMAL LOW (ref 8.9–10.3)
Chloride: 102 mmol/L (ref 98–111)
Chloride: 102 mmol/L (ref 98–111)
Creatinine, Ser: 1.99 mg/dL — ABNORMAL HIGH (ref 0.44–1.00)
Creatinine, Ser: 2.48 mg/dL — ABNORMAL HIGH (ref 0.44–1.00)
GFR, Estimated: 22 mL/min — ABNORMAL LOW (ref >60)
GFR, Estimated: 17 mL/min — ABNORMAL LOW (ref >60)
Glucose, Bld: 164 mg/dL — ABNORMAL HIGH (ref 70–99)
Glucose, Bld: 105 mg/dL — ABNORMAL HIGH (ref 70–99)
Potassium: 3.4 mmol/L — ABNORMAL LOW (ref 3.5–5.1)
Potassium: 3.5 mmol/L (ref 3.5–5.1)
Sodium: 141 mmol/L (ref 135–145)
Sodium: 139 mmol/L (ref 135–145)

## 2023-02-18 LAB — GLUCOSE, CAPILLARY
Glucose-Capillary: 109 mg/dL — ABNORMAL HIGH (ref 70–99)
Glucose-Capillary: 174 mg/dL — ABNORMAL HIGH (ref 70–99)
Glucose-Capillary: 112 mg/dL — ABNORMAL HIGH (ref 70–99)
Glucose-Capillary: 114 mg/dL — ABNORMAL HIGH (ref 70–99)

## 2023-02-18 MED ORDER — HYDROXYZINE HCL 25 MG PO TABS
25.0000 mg | ORAL_TABLET | Freq: Three times a day (TID) | ORAL | Status: DC | PRN
  Administered 2023-02-19 – 2023-02-21 (×2): 25 mg via ORAL
  Filled 2023-02-18 (×2): qty 1

## 2023-02-18 MED ORDER — POTASSIUM CHLORIDE CRYS ER 20 MEQ PO TBCR
40.0000 meq | EXTENDED_RELEASE_TABLET | Freq: Once | ORAL | Status: AC
  Administered 2023-02-18: 40 meq via ORAL
  Filled 2023-02-18: qty 2

## 2023-02-18 NOTE — Evaluation (Signed)
Occupational Therapy Evaluation Patient Details Name: Sara Garza MRN: [id number] DOB: 1923-07-08 Today's Date: 02/18/2023   History of Present Illness Pt is 89 year old presented to [facility] on 02/15/23 for worsening SOB and BLE edema. Transferred to [facility] on  02/16/23. Pt with acute on chronic diastolic CHF. Pt also with severe mitral stenosis. PMH - CAD, CKD, DM, HTN, CHF, [name], Bil THR   Clinical Impression   PTA, pt lived alone and was mod I for mobility, toileting, and grooming. Family, neighbors, and PCA assisted with bathing, dressing, meals, and transportation. Upon eval, pt with increased O2 demand needing 7L of supplemental O2 (does not wear at baseline), decreased activity tolerance, strength, cardiopulmonary status, and balance. Pt needing min A for transfers and up to mod A for ADL. Patient will benefit from continued inpatient follow up therapy, <3 hours/day        If plan is discharge home, recommend the following: A little help with walking and/or transfers;A little help with bathing/dressing/bathroom;Assistance with cooking/housework;Assist for transportation;Help with stairs or ramp for entrance    Functional Status Assessment  Patient has had a recent decline in their functional status and demonstrates the ability to make significant improvements in function in a reasonable and predictable amount of time.  Equipment Recommendations  Other (comment) (defer)    Recommendations for Other Services       Precautions / Restrictions Precautions Precautions: Fall;Other (comment) Precaution Comments: watch SpO2 and HR Restrictions Weight Bearing Restrictions: No      Mobility Bed Mobility Overal bed mobility: Needs Assistance Bed Mobility: Supine to Sit     Supine to sit: Min assist, HOB elevated     General bed mobility comments: Assist to elevate trunk into sitting and bring hips to EOB    Transfers Overall transfer level: Needs assistance Equipment  used: Rolling walker (2 wheels) Transfers: Sit to/from Stand, Bed to chair/wheelchair/BSC Sit to Stand: Min assist     Step pivot transfers: Min assist     General transfer comment: light lift and steadying assist      Balance Overall balance assessment: Needs assistance Sitting-balance support: No upper extremity supported, Feet supported Sitting balance-Leahy Scale: Fair     Standing balance support: Bilateral upper extremity supported, During functional activity, Reliant on assistive device for balance Standing balance-Leahy Scale: Poor Standing balance comment: walker and min assist for static standing                           ADL either performed or assessed with clinical judgement   ADL Overall ADL's : Needs assistance/impaired Eating/Feeding: Set up;Sitting   Grooming: Set up;Sitting   Upper Body Bathing: Set up;Sitting   Lower Body Bathing: Sit to/from stand;Moderate assistance   Upper Body Dressing : Minimal assistance;Sitting   Lower Body Dressing: Moderate assistance;Sit to/from stand   Toilet Transfer: Minimal assistance;Rolling walker (2 wheels);Stand-pivot           Functional mobility during ADLs: Minimal assistance;Rolling walker (2 wheels) General ADL Comments: limited by decresaed activity tolerance.     Vision Patient Visual Report: No change from baseline       Perception         Praxis         Pertinent Vitals/Pain Pain Assessment Pain Assessment: No/denies pain     Extremity/Trunk Assessment Upper Extremity Assessment Upper Extremity Assessment: Generalized weakness;RUE deficits/detail;LUE deficits/detail;Right hand dominant RUE Deficits / Details: RUE shoulder flexion to ~10  degrees, arthritic joints in hands. pt reports arthritis in shoulders and that she routinely has fluid drawn from shoulders LUE Deficits / Details: LUE shoulder flexion ~15 degrees   Lower Extremity Assessment Lower Extremity Assessment:  Defer to PT evaluation       Communication Communication Communication: Hearing impairment   Cognition Arousal: Alert Behavior During Therapy: [facility] for tasks assessed/performed Overall Cognitive Status: Within Functional Limits for tasks assessed                                 General Comments: some incresed time for new learning and needing education to optimize expectations for recovery with pt initially reporting "I figured I could just go home and get up with my walker and take myself to the bathroom when needed". Difficulty linking current abilities/deficits to functional performance at home.     General Comments  Pt on 7L O2 with SpO2 95 at rest. SpO2 as low as 85% with initial transfer; stabilizing at 91% in standing with cues for pursed lip breathing    Exercises Exercises: Other exercises Other Exercises Other Exercises: Practicing pursed lip breathing 10x with rest breaks every 2 repetitions Other Exercises: IS 10x with cues for optimal technique   Shoulder Instructions      Home Living Family/patient expects to be discharged to:: Private residence Living Arrangements: Alone Available Help at Discharge: Family;Personal care attendant;Available PRN/intermittently Type of Home: [place] Home Access: Level entry     Home Layout: One level     Bathroom Shower/Tub: Producer, television/film/video: Handicapped height     Home Equipment: Agricultural consultant (2 wheels);Shower seat;BSC/3in1          Prior Functioning/Environment Prior Level of Function : Needs assist       Physical Assist : ADLs (physical)   ADLs (physical): Bathing;IADLs Mobility Comments: Pt modified independent with rolling walker. ADLs Comments: Caregiver comes in to assist with bathing. Also has someone that comes to clean every 3 weeks. friend often provides meals and transportatoin to appointments        OT Problem List: Decreased strength;Decreased activity  tolerance;Impaired balance (sitting and/or standing);Decreased safety awareness;Decreased knowledge of use of DME or AE;Cardiopulmonary status limiting activity      OT Treatment/Interventions: Self-care/ADL training;Therapeutic exercise;DME and/or AE instruction;Balance training;Patient/family education;Therapeutic activities    OT Goals(Current goals can be found in the care plan section) Acute Rehab OT Goals Patient Stated Goal: go home OT Goal Formulation: With patient Time For Goal Achievement: 03/04/23 Potential to Achieve Goals: Good  OT Frequency: Min 1X/week    Co-evaluation              AM-PAC OT "6 Clicks" Daily Activity     Outcome Measure Help from another person eating meals?: A Little Help from another person taking care of personal grooming?: A Little Help from another person toileting, which includes using toliet, bedpan, or urinal?: A Lot Help from another person bathing (including washing, rinsing, drying)?: A Lot Help from another person to put on and taking off regular upper body clothing?: A Little Help from another person to put on and taking off regular lower body clothing?: A Lot 6 Click Score: 15   End of Session Equipment Utilized During Treatment: Gait belt;Rolling walker (2 wheels);Oxygen Nurse Communication: Mobility status  Activity Tolerance: Patient tolerated treatment well Patient left: in chair;with call bell/phone within reach;with family/visitor present  OT Visit Diagnosis: Unsteadiness  on feet (R26.81);Muscle weakness (generalized) (M62.81)                Time: 0272-5366 OT Time Calculation (min): 26 min Charges:  OT General Charges $OT Visit: 1 Visit OT Evaluation $OT Eval Moderate Complexity: 1 Mod OT Treatments $Self Care/Home Management : 8-22 mins  Tyler Deis, OTR/L [facility] Acute Rehabilitation Office: [phone]   Sara Garza 02/18/2023, 4:03 PM

## 2023-02-18 NOTE — Consult Note (Signed)
Consultation Note Date: 02/18/2023   Patient Name: Sara Garza  DOB: November 08, 1923  MRN: [id number]  Age / Sex: 89 y.o., female  PCP: Shirline Frees, NP Referring Physician: Kathlen Mody, MD  Reason for Consultation: Establishing goals of care  HPI/Patient Profile: 89 y.o. female  with past medical history of HFpEF, CAD, severe to moderate mitral valve stenosis, HTN, s/p [name], CKD stage 2, diabetes admitted on 02/15/2023 with shortness of breath related to severe mitral valve stenosis. Diuresis recommended but limited by renal function.   Clinical Assessment and Goals of Care: Consult received and chart review completed. I discussed with RN. I met with [name] and she has 2 visitors at bedside. She is up in recliner and in good spirits. No distress. I spoke with her about having a conversation together with her daughter, Sara Garza, and she agrees this will be good with her.   I called and spoke with Sara Garza. Sara Garza and I had a good conversation about her mother's condition with severe mitral valve stenosis - she tells me that her mother is aware of the consequences noting her brother died from this at 35 years old. Sara Garza shares that she and her mother know that she is 78 years old and that time may be limited. We discussed in detail heart disease and diuresis but complicated and limited by renal function. We discussed concern that she may need oxygen at home. She has been living independently and is very active and does not sit at home but enjoys getting out. We discussed time to see how she progresses and how good we can get her but also how long we can keep her that good. We discussed outpatient palliative (unfortunately this is very limited visits in the home and not very frequent) vs hospice services in the home. Sara Garza expresses understanding. Sara Garza agrees to join over speaker phone to discuss with her mother with  myself at 1200 noon tomorrow.   All questions/concerns addressed. Emotional support provided.   Primary Decision Maker PATIENT    SUMMARY OF RECOMMENDATIONS   - DNR in place (will clarify desire for intubation during meeting tomorrow - Family meeting tomorrow 1200 noon for goals of care   Code Status/Advance Care Planning: DNR   Symptom Management:  Per attending, cardiology  Prognosis:  Overall prognosis poor. Time for outcomes.   Discharge Planning: To Be Determined      Primary Diagnoses: Present on Admission:  Acute on chronic diastolic CHF (congestive heart failure) (HCC)  Essential hypertension  CKD (chronic kidney disease), stage III (HCC)   I have reviewed the medical record, interviewed the patient and family, and examined the patient. The following aspects are pertinent.  Past Medical History:  Diagnosis Date   Arthritis    CAD (coronary artery disease)    Carotid stenosis, left    50-60% stable   Diabetes mellitus    Glaucoma    Hyperlipidemia    Hypertension    Leg pain    ABIs 2/18: normal bilaterally.   Osteoporosis  Valvular heart disease    Aortic Stenosis s/p [name] 2014 // Mitral stenosis // Echo 09/2018: EF > 65, mod LVH, severe focal basal hypertrophy, RVSP 39.8, mild to mod MR, mod to severe MS (mean 9), AVR with mild AI, severe LAE (similar to prior echo)   Social History   Socioeconomic History   Marital status: Widowed    Spouse name: Not on file   Number of children: Not on file   Years of education: Not on file   Highest education level: Not on file  Occupational History   Not on file  Tobacco Use   Smoking status: Never    Passive exposure: Yes   Smokeless tobacco: Never  Substance and Sexual Activity   Alcohol use: Yes    Alcohol/week: 0.0 standard drinks of alcohol    Comment: very seldom   Drug use: No   Sexual activity: Not Currently  Other Topics Concern   Not on file  Social History Narrative   She worked as a  Tree surgeon for 10 years    Has one daughter      She likes to read and she takes classes at [employer]: Low Risk  (05/22/2022)   Overall Financial Resource Strain (CARDIA)    Difficulty of Paying Living Expenses: Not hard at all  Food Insecurity: No Food Insecurity (02/16/2023)   Hunger Vital Sign    Worried About Running Out of Food in the Last Year: Never true    Ran Out of Food in the Last Year: Never true  Transportation Needs: No Transportation Needs (02/16/2023)   PRAPARE - Administrator, Civil Service (Medical): No    Lack of Transportation (Non-Medical): No  Physical Activity: Inactive (05/22/2022)   Exercise Vital Sign    Days of Exercise per Week: 0 days    Minutes of Exercise per Session: 0 min  Stress: No Stress Concern Present (05/22/2022)   [employer] of Occupational Health - Occupational Stress Questionnaire    Feeling of Stress : Not at all  Social Connections: Moderately Integrated (05/22/2022)   Social Connection and Isolation Panel [NHANES]    Frequency of Communication with Friends and Family: More than three times a week    Frequency of Social Gatherings with Friends and Family: More than three times a week    Attends Religious Services: More than 4 times per year    Active Member of [employer] or Organizations: Yes    Attends Banker Meetings: More than 4 times per year    Marital Status: Widowed   Family History  Problem Relation Age of Onset   COPD Father    Cancer Father        lung cancer   Lung cancer Other    Scheduled Meds:  aspirin EC  81 mg Oral Daily   azithromycin  500 mg Oral Daily   enoxaparin (LOVENOX) injection  30 mg Subcutaneous Q24H   ezetimibe  10 mg Oral Daily   insulin aspart  0-9 Units Subcutaneous TID WC   potassium chloride  40 mEq Oral Once   Continuous Infusions: PRN Meds:.acetaminophen **OR** acetaminophen, hydrOXYzine,  polyethylene glycol Allergies  Allergen Reactions   Statins Other (See Comments)    Muscle aches   Gabapentin Other (See Comments)    SOMNOLENCE   Review of Systems  Constitutional:  Positive for activity change and fatigue. Negative for appetite  change.  Respiratory:  Positive for shortness of breath.   Cardiovascular:  Positive for leg swelling.    Physical Exam Constitutional:      General: She is not in acute distress.    Appearance: Normal appearance.  Cardiovascular:     Rate and Rhythm: Tachycardia present.  Pulmonary:     Effort: No tachypnea, accessory muscle usage or respiratory distress.     Comments: 7L oxygen; no distress at rest Abdominal:     Palpations: Abdomen is soft.  Neurological:     Mental Status: She is alert and oriented to person, place, and time.     Vital Signs: BP 115/74 (BP Location: Right Arm)   Pulse (!) 107   Temp 98 F (36.7 C) (Oral)   Resp (!) 34   Ht 4\' 11"  (1.499 m)   Wt 57.5 kg   SpO2 92%   BMI 25.60 kg/m  Pain Scale: 0-10   Pain Score: 0-No pain   SpO2: SpO2: 92 % O2 Device:SpO2: 92 % O2 Flow Rate: .O2 Flow Rate (L/min): 7 L/min  IO: Intake/output summary:  Intake/Output Summary (Last 24 hours) at 02/18/2023 1417 Last data filed at 02/18/2023 0300 Gross per 24 hour  Intake --  Output 0 ml  Net 0 ml    LBM: Last BM Date : 02/15/23 Baseline Weight: Weight: 57.6 kg Most recent weight: Weight: 57.5 kg     Palliative Assessment/Data:     Time Total: 60 min  Greater than 50%  of this time was spent counseling and coordinating care related to the above assessment and plan.  Signed by: Yong Channel, NP Palliative Medicine Team Pager # [phone] (M-F 8a-5p) Team Phone # [phone] (Nights/Weekends)

## 2023-02-18 NOTE — Progress Notes (Signed)
[facility]                                                                               Sara Garza, is a 89 y.o. female, DOB - 05/17/1923, QIO:[id number] Admit date - 02/15/2023    Outpatient Primary MD for the patient is Nafziger, Kandee Keen, NP  LOS - 2  days    Brief summary   Sara Garza is a 89 y.o. female with medical history significant of congestive heart failure, preserved ejection fraction, prior known history of coronary artery disease chronic kidney disease type 2 diabetes mellitus, hypertension as well as prior transcatheter aortic valve replacement presents with acute diastolic CHF. She was started on IV lasix , without much improvement. Lasix held today for AKI. Palliative care consulted for goals of care.   Assessment & Plan    Assessment and Plan: * Acute on chronic diastolic CHF (congestive heart failure) (HCC) She was initially started on IV lasix 40 mg BID, but her creatinine worsened to 1.99, lasix held today. Diuresed about 1.7 lit net negative since admission.  Echocardiogram ordered showed persevered LVef, but severe MS.  Cardiology on board and appreciate recommendations.  Strict intake and output and daily weights.   Acute respiratory failure with hypoxia:  Probably sec to the above and acute bronchitis. Currently requiring about 7 lit of [place] oxygen  Resume azithromycin.    Acute  on CKD (chronic kidney disease), stage IIIb (HCC) Slight worsening due to diuresis.  Holding am lasix.  Repeat BMP this evening.    Essential hypertension BP parameters are optimal.     H/p [name] Stable on repeat echocardiogram.    Severe to moderate mitral valve stenosis Unfortunately not a candidate for intervention.  Monitor.   Type 2 DM non insulin dependent.  CBG (last 3)  Recent Labs    02/18/23 0630  GLUCAP 109*    Continue with SSI.  A1c is 5.8%   Hypokalemia Replaced.  Check magnesium levels.   Anemia of chronic disease:   Baseline hemoglobin between 10 to 12.  Dropped to 9.7 today. Monitor.        Estimated body mass index is 25.6 kg/m as calculated from the following:   Height as of this encounter: 4\' 11"  (1.499 m).   Weight as of this encounter: 57.5 kg.  Code Status: DNR INTERVENTIONS DESIRED. MAY INTUBATE.  DVT Prophylaxis:  enoxaparin (LOVENOX) injection 30 mg Start: 02/17/23 0800 SCDs Start: 02/16/23 1523   Level of Care: Level of care: Progressive Family Communication: none at bedside.   Disposition Plan:     Remains inpatient appropriate:  pending improvement.   Procedures:  echo  Consultants:   Cardiology.   Antimicrobials:   Anti-infectives (From admission, onward)    Start     Dose/Rate Route Frequency Ordered Stop   02/17/23 1700  azithromycin (ZITHROMAX) tablet 500 mg        500 mg Oral Daily 02/17/23 1031 02/19/23 1659   02/16/23 1630  azithromycin (ZITHROMAX) 500 mg in sodium chloride 0.9 % 250 mL IVPB  Status:  Discontinued        500 mg 255 mL/hr over 60 Minutes Intravenous  Every 24 hours 02/16/23 1539 02/17/23 1031   02/16/23 1615  cefTRIAXone (ROCEPHIN) 1 g in sodium chloride 0.9 % 100 mL IVPB  Status:  Discontinued        1 g 200 mL/hr over 30 Minutes Intravenous Every 24 hours 02/16/23 1529 02/17/23 1030   02/16/23 1615  azithromycin (ZITHROMAX) 500 mg in dextrose 5 % 250 mL IVPB  Status:  Discontinued        500 mg 250 mL/hr over 60 Minutes Intravenous Every 24 hours 02/16/23 1529 02/16/23 1539        Medications  Scheduled Meds:  aspirin EC  81 mg Oral Daily   azithromycin  500 mg Oral Daily   enoxaparin (LOVENOX) injection  30 mg Subcutaneous Q24H   ezetimibe  10 mg Oral Daily   furosemide  60 mg Intravenous BID   insulin aspart  0-9 Units Subcutaneous TID WC   Continuous Infusions: PRN Meds:.acetaminophen **OR** acetaminophen, hydrOXYzine, polyethylene glycol    Subjective:   Sara Garza was seen and examined today. Reports breathing has  improved.   Objective:   Vitals:   02/17/23 2326 02/18/23 0338 02/18/23 0414 02/18/23 0717  BP: 125/64 97/70  125/85  Pulse: (!) 108 89  (!) 104  Resp: (!) 21 20  (!) 28  Temp: 97.7 F (36.5 C) 98 F (36.7 C)  98 F (36.7 C)  TempSrc: Oral Oral  Oral  SpO2: 94% 96%  91%  Weight:   57.5 kg   Height:        Intake/Output Summary (Last 24 hours) at 02/18/2023 1041 Last data filed at 02/18/2023 0300 Gross per 24 hour  Intake --  Output 300 ml  Net -300 ml   Filed Weights   02/16/23 1432 02/17/23 0500 02/18/23 0414  Weight: 57.6 kg 57.2 kg 57.5 kg     Exam General exam: ill appearing elderly lady on7 lit  [place] oxygen.  Respiratory system:diminished at bases. No wheezing or rhonchi, tachypnea, [place] oxygen  Cardiovascular system: S1 & S2 heard, tachycardic, systolic murmer.  Gastrointestinal system: Abdomen is soft, non tender non distended bs+ Central nervous system: Alert and oriented. Extremities: no pedal edema.  Skin: No rashes,  Psychiatry:  Mood & affect appropriate.     Data Reviewed:  I have personally reviewed following labs and imaging studies   CBC Lab Results  Component Value Date   WBC 6.3 02/17/2023   RBC 3.14 (L) 02/17/2023   HGB 9.7 (L) 02/17/2023   HCT 30.5 (L) 02/17/2023   MCV 97.1 02/17/2023   MCH 30.9 02/17/2023   PLT 227 02/17/2023   MCHC 31.8 02/17/2023   RDW 13.0 02/17/2023   LYMPHSABS 0.7 02/15/2023   MONOABS 0.9 02/15/2023   EOSABS 0.2 02/15/2023   BASOSABS 0.0 02/15/2023     Last metabolic panel Lab Results  Component Value Date   NA 142 02/17/2023   K 3.7 02/17/2023   CL 108 02/17/2023   CO2 25 02/17/2023   BUN 41 (H) 02/17/2023   CREATININE 1.55 (H) 02/17/2023   GLUCOSE 106 (H) 02/17/2023   GFRNONAA 30 (L) 02/17/2023   GFRAA 27 (L) 03/07/2020   CALCIUM 8.1 (L) 02/17/2023   PHOS 4.8 (H) 02/17/2023   PROT 7.2 02/15/2023   ALBUMIN 3.9 02/15/2023   BILITOT 0.7 02/15/2023   ALKPHOS 65 02/15/2023   AST 13 (L) 02/15/2023    ALT 9 02/15/2023   ANIONGAP 9 02/17/2023    CBG (last 3)  Recent Labs  02/18/23 0630  GLUCAP 109*      Coagulation Profile: Recent Labs  Lab 02/17/23 0312  INR 1.1     Radiology Studies: VAS [place] LOWER EXTREMITY VENOUS (DVT)  Result Date: 02/17/2023  Lower Venous DVT Study Patient Name:  Sara Garza  Date of Exam:   02/17/2023 Medical Rec #: [id number]       Accession #:    [id number] Date of Birth: [birth year]        Patient Gender: F Patient Age:   21 years Exam Location:  [facility] Procedure:      VAS [place] LOWER EXTREMITY VENOUS (DVT) Referring Phys: [facility] GOEL --------------------------------------------------------------------------------  Indications: Pain, Swelling, Edema, and SOB.  Comparison Study: Previous study of left lower extremity on 10.14.2016. Performing Technologist: Fernande Bras  Examination Guidelines: A complete evaluation includes B-mode imaging, spectral Doppler, color Doppler, and power Doppler as needed of all accessible portions of each vessel. Bilateral testing is considered an integral part of a complete examination. Limited examinations for reoccurring indications may be performed as noted. The reflux portion of the exam is performed with the patient in reverse Trendelenburg.  +---------+---------------+---------+-----------+----------+--------------+ RIGHT    CompressibilityPhasicitySpontaneityPropertiesThrombus Aging +---------+---------------+---------+-----------+----------+--------------+ CFV      Full           Yes      Yes                                 +---------+---------------+---------+-----------+----------+--------------+ SFJ      Full                                                        +---------+---------------+---------+-----------+----------+--------------+ FV Prox  Full                                                        +---------+---------------+---------+-----------+----------+--------------+  FV Mid   Full                                                        +---------+---------------+---------+-----------+----------+--------------+ FV DistalFull                                                        +---------+---------------+---------+-----------+----------+--------------+ PFV      Full                                                        +---------+---------------+---------+-----------+----------+--------------+ POP      Full           Yes      Yes                                 +---------+---------------+---------+-----------+----------+--------------+  PTV      Full                                                        +---------+---------------+---------+-----------+----------+--------------+ PERO     Full                                                        +---------+---------------+---------+-----------+----------+--------------+   +---------+---------------+---------+-----------+----------+--------------+ LEFT     CompressibilityPhasicitySpontaneityPropertiesThrombus Aging +---------+---------------+---------+-----------+----------+--------------+ CFV      Full           Yes      Yes                                 +---------+---------------+---------+-----------+----------+--------------+ SFJ      Full                                                        +---------+---------------+---------+-----------+----------+--------------+ FV Prox  Full                                                        +---------+---------------+---------+-----------+----------+--------------+ FV Mid   Full                                                        +---------+---------------+---------+-----------+----------+--------------+ FV DistalFull                                                        +---------+---------------+---------+-----------+----------+--------------+ PFV      Full                                                         +---------+---------------+---------+-----------+----------+--------------+ POP      Full           Yes      Yes                                 +---------+---------------+---------+-----------+----------+--------------+ PTV      Full                                                        +---------+---------------+---------+-----------+----------+--------------+  PERO     Full                                                        +---------+---------------+---------+-----------+----------+--------------+     Summary: BILATERAL: - No evidence of deep vein thrombosis seen in the lower extremities, bilaterally. -No evidence of popliteal cyst, bilaterally.   *See table(s) above for measurements and observations. Electronically signed by Gerarda Fraction on 02/17/2023 at 4:19:27 PM.    Final    ECHOCARDIOGRAM COMPLETE  Result Date: 02/16/2023    ECHOCARDIOGRAM REPORT   Patient Name:   Sara Garza Date of Exam: 02/16/2023 Medical Rec #:  [id number]      Height:       59.0 in Accession #:    [id number]     Weight:       127.0 lb Date of Birth:  April 30, 1923       BSA:          1.521 m Patient Age:    89 years       BP:           130/70 mmHg Patient Gender: F              HR:           92 bpm. Exam Location:  Inpatient Procedure: 2D Echo, Cardiac Doppler and Color Doppler STAT ECHO Indications:    acute systolic chf.  History:        Patient has prior history of Echocardiogram examinations, most                 recent 04/15/2022. Chronic kidney disease, Arrythmias:LBBB; Risk                 Factors:Hypertension and Dyslipidemia.                 Aortic Valve: 23 mm Medtronic stented ([name]) valve is present in                 the aortic position. Procedure Date: 2014.  Sonographer:    Delcie Roch RDCS Referring Phys: [id number] Lanier Prude  Sonographer Comments: Image acquisition challenging due to respiratory motion. IMPRESSIONS  1. Left ventricular ejection  fraction, by estimation, is 50 to 55%. The left ventricle has low normal function. The left ventricle demonstrates regional wall motion abnormalities- aneurysmal mid ventricular septum with no mural thrombus and stable apperance. There is moderate left ventricular hypertrophy. Left ventricular diastolic parameters are indeterminate.  2. Right ventricular systolic function is normal. The right ventricular size is normal. There is mildly elevated pulmonary artery systolic pressure. The estimated right ventricular systolic pressure is 43.8 mmHg.  3. Left atrial size was severely dilated.  4. The mitral valve is degenerative. Mild to moderate mitral valve regurgitation. Severe mitral stenosis. The mean mitral valve gradient is 13.8 mmHg with average heart rate of 89 bpm. Severe mitral annular calcification.  5. The aortic valve has been repaired/replaced. Aortic valve regurgitation is mild. There is a 23 mm Medtronic stented ([name]) valve present in the aortic position. Procedure Date: 2014. Aortic valve area, by VTI measures 1.27 cm. Aortic valve mean gradient measures 18.5 mmHg. Aortic valve Vmax measures 2.86 m/s. Aortic valve acceleration time measures 79 msec, AV prosthesis is grossly stable, DVI 0.37.  6. The  inferior vena cava is normal in size with <50% respiratory variability, suggesting right atrial pressure of 8 mmHg. FINDINGS  Left Ventricle: Left ventricular ejection fraction, by estimation, is 50 to 55%. The left ventricle has low normal function. The left ventricle demonstrates regional wall motion abnormalities. The left ventricular internal cavity size was normal in size. There is moderate left ventricular hypertrophy. Left ventricular diastolic parameters are indeterminate.  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 Right Ventricle: The right ventricular size is normal. No increase in right ventricular wall thickness. Right ventricular systolic function is normal. There is mildly  elevated pulmonary artery systolic pressure. The tricuspid regurgitant velocity is 2.99  m/s, and with an assumed right atrial pressure of 8 mmHg, the estimated right ventricular systolic pressure is 43.8 mmHg. Left Atrium: Left atrial size was severely dilated. Right Atrium: Right atrial size was normal in size. Pericardium: Trivial pericardial effusion is present. Presence of epicardial fat layer. Mitral Valve: The mitral valve is degenerative in appearance. Severe mitral annular calcification. Mild to moderate mitral valve regurgitation. Severe mitral valve stenosis. MV peak gradient, 27.4 mmHg. The mean mitral valve gradient is 13.8 mmHg with average heart rate of 89 bpm. Tricuspid Valve: The tricuspid valve is normal in structure. Tricuspid valve regurgitation is trivial. No evidence of tricuspid stenosis. Aortic Valve: The aortic valve has been repaired/replaced. Aortic valve regurgitation is mild. Aortic valve mean gradient measures 18.5 mmHg. Aortic valve peak gradient measures 32.7 mmHg. Aortic valve area, by VTI measures 1.27 cm. There is a 23 mm Medtronic stented ([name]) valve present in the aortic position. Procedure Date: 2014. Pulmonic Valve: The pulmonic valve was normal in structure. Pulmonic valve regurgitation is mild. No evidence of pulmonic stenosis. Aorta: The aortic root is normal in size and structure. Venous: The inferior vena cava is normal in size with less than 50% respiratory variability, suggesting right atrial pressure of 8 mmHg. IAS/Shunts: The atrial septum is grossly normal.  LEFT VENTRICLE PLAX 2D LVIDd:         5.10 cm LVIDs:         3.80 cm LV PW:         1.10 cm LV IVS:        1.10 cm LVOT diam:     2.10 cm LV SV:         71 LV SV Index:   47 LVOT Area:     3.46 cm  RIGHT VENTRICLE             IVC RV Basal diam:  2.70 cm     IVC diam: 1.90 cm RV S prime:     10.30 cm/s TAPSE (M-mode): 1.4 cm LEFT ATRIUM              Index        RIGHT ATRIUM           Index LA diam:        4.80  cm  3.16 cm/m   RA Area:     13.20 cm LA Vol (A2C):   89.6 ml  58.93 ml/m  RA Volume:   31.50 ml  20.72 ml/m LA Vol (A4C):   123.0 ml 80.89 ml/m LA Biplane Vol: 113.0 ml 74.31 ml/m  AORTIC VALVE AV Area (Vmax):    1.30 cm AV Area (Vmean):   1.18 cm AV Area (VTI):     1.27 cm AV Vmax:           285.76 cm/s  AV Vmean:          216.081 cm/s AV VTI:            0.562 m AV Peak Grad:      32.7 mmHg AV Mean Grad:      18.5 mmHg LVOT Vmax:         107.25 cm/s LVOT Vmean:        73.575 cm/s LVOT VTI:          0.206 m LVOT/AV VTI ratio: 0.37  AORTA Ao Root diam: 2.80 cm Ao Asc diam:  2.80 cm MITRAL VALVE              TRICUSPID VALVE MV Peak grad: 27.4 mmHg   TR Peak grad:   35.8 mmHg MV Mean grad: 13.8 mmHg   TR Vmax:        299.00 cm/s MV Vmax:      2.62 m/s MV Vmean:     180.5 cm/s  SHUNTS MR Peak grad: 59.5 mmHg   Systemic VTI:  0.21 m MR Mean grad: 44.0 mmHg   Systemic Diam: 2.10 cm MR Vmax:      385.67 cm/s MR Vmean:     267.0 cm/s Weston Brass MD Electronically signed by Weston Brass MD Signature Date/Time: 02/16/2023/6:08:12 PM    Final        Kathlen Mody M.D. [facility] 02/18/2023, 10:41 AM  Available via Epic secure chat 7am-7pm After 7 pm, please refer to night coverage provider listed on amion.

## 2023-02-18 NOTE — TOC Initial Note (Signed)
Transition of Care [facility]) - Initial/Assessment Note    Patient Details  Name: Sara Garza MRN: [id number] Date of Birth: 01/17/1924  Transition of Care [facility]) CM/SW Contact:    Marliss Coots, LCSW Phone Number: 02/18/2023, 4:06 PM  Clinical Narrative:                  This CSW attempted to introduce herself, role, and discuss SNF options per physical therapy recommendation at patient's bedside multiple times today (1:00PM, 2:00PM, 3:00PM). Each time patient was busy with a medical staff member. CSW is aware that a palliative care consult was submitted and will continue to follow patient's chart.  Expected Discharge Plan: Home w Home Health Services Barriers to Discharge: Continued Medical Work up   Patient Goals and CMS Choice Patient states their goals for this hospitalization and ongoing recovery are:: return home CMS Medicare.gov Compare Post Acute Care list provided to:: Patient Choice offered to / list presented to : Patient      Expected Discharge Plan and Services   Discharge Planning Services: CM Consult Post Acute Care Choice: Home Health Living arrangements for the past 2 months: Single Family Home                                      Prior Living Arrangements/Services Living arrangements for the past 2 months: Single Family Home Lives with:: Self Patient language and need for interpreter reviewed:: Yes Do you feel safe going back to the place where you live?: Yes      Need for Family Participation in Patient Care: Yes (Comment) Care giver support system in place?: Yes (comment)   Criminal Activity/Legal Involvement Pertinent to Current Situation/Hospitalization: No - Comment as needed  Activities of Daily Living   ADL Screening (condition at time of admission) Independently performs ADLs?: Yes (appropriate for developmental age) Is the patient deaf or have difficulty hearing?: No Does the patient have difficulty seeing, even when wearing  glasses/contacts?: No Does the patient have difficulty concentrating, remembering, or making decisions?: No  Permission Sought/Granted                  Emotional Assessment Appearance:: Appears younger than stated age Attitude/Demeanor/Rapport: Gracious Affect (typically observed): Accepting Orientation: : Oriented to Self, Oriented to Place, Oriented to  Time, Oriented to Situation Alcohol / Substance Use: Not Applicable Psych Involvement: No (comment)  Admission diagnosis:  Acute respiratory failure with hypoxia (HCC) [J96.01] Acute on chronic diastolic CHF (congestive heart failure) (HCC) [I50.33] Acute on chronic congestive heart failure, unspecified heart failure type (HCC) [I50.9] Patient Active Problem List   Diagnosis Date Noted   Trigger finger, acquired 12/19/2021   AC (acromioclavicular) arthritis 06/27/2021   Right rotator cuff tear arthropathy 05/16/2021   Acute CHF (congestive heart failure) (HCC) 02/14/2020   Acute blood loss anemia 01/03/2020   Eye irritation 01/01/2020   Mitral valve stenosis    Benign essential HTN    Chronic diastolic CHF (congestive heart failure) (HCC)    Hip fracture (HCC) 12/30/2019   Closed fracture of femur, intertrochanteric, left, initial encounter (HCC)    Acute on chronic diastolic CHF (congestive heart failure) (HCC) 02/25/2018   S/P [name] (transcatheter aortic valve replacement) 02/25/2018   Leg pain    Hyperlipidemia    Glaucoma    Carotid stenosis, left    Arthritis    Urinary incontinence 02/12/2017   Chronic bilateral  low back pain 08/12/2016   Spondylosis of lumbar joint 08/12/2016   Bilateral leg pain 08/12/2016   Neck mass 04/15/2015   Status post hip hemiarthroplasty 07/31/2013   CAD (coronary artery disease) 07/31/2013   CKD (chronic kidney disease), stage III (HCC) 07/31/2013   Left bundle branch block 07/31/2013   Severe calcific aortic stenosis 06/18/2010   Diabetic polyneuropathy (HCC) 04/16/2010    GLAUCOMA 01/06/2009   CHEST PAIN, ATYPICAL 12/16/2008   Bilateral shoulder pain 11/16/2007   Osteoarthritis 02/02/2007   Type 2 diabetes mellitus with renal manifestations, controlled (HCC) 10/24/2006   Dyslipidemia 10/02/2006   Essential hypertension 10/02/2006   PCP:  Shirline Frees, NP Pharmacy:   [facility] [place], [place] - [address] [facility] [address] [place] [address] Phone: [phone] Fax: [phone]     Social Determinants of Health (SDOH) Social History: SDOH Screenings   Food Insecurity: No Food Insecurity (02/16/2023)  Housing: Low Risk  (02/16/2023)  Transportation Needs: No Transportation Needs (02/16/2023)  Utilities: Not At Risk (02/16/2023)  Alcohol Screen: Low Risk  (05/22/2022)  Depression (PHQ2-9): Low Risk  (05/22/2022)  Financial Resource Strain: Low Risk  (05/22/2022)  Physical Activity: Inactive (05/22/2022)  Social Connections: Moderately Integrated (05/22/2022)  Stress: No Stress Concern Present (05/22/2022)  Tobacco Use: Medium Risk (02/15/2023)   SDOH Interventions:     Readmission Risk Interventions     No data to display

## 2023-02-18 NOTE — Progress Notes (Signed)
Rounding Note    Patient Name: Sara Garza Date of Encounter: 02/18/2023  [facility] Cardiologist: Tonny Bollman, MD   Subjective   Pt is still SOB   DId not sleep well last night  No CP     Inpatient Medications    Scheduled Meds:  aspirin EC  81 mg Oral Daily   azithromycin  500 mg Oral Daily   enoxaparin (LOVENOX) injection  30 mg Subcutaneous Q24H   ezetimibe  10 mg Oral Daily   furosemide  60 mg Intravenous BID   insulin aspart  0-9 Units Subcutaneous TID WC   Continuous Infusions:   Vital Signs    Vitals:   02/17/23 2000 02/17/23 2326 02/18/23 0338 02/18/23 0414  BP: (!) 126/57 125/64 97/70   Pulse: (!) 114 (!) 108 89   Resp: (!) 33 (!) 21 20   Temp:  97.7 F (36.5 C) 98 F (36.7 C)   TempSrc:  Oral Oral   SpO2: 94% 94% 96%   Weight:    57.5 kg  Height:        Intake/Output Summary (Last 24 hours) at 02/18/2023 0616 Last data filed at 02/18/2023 0300 Gross per 24 hour  Intake --  Output 300 ml  Net -300 ml    Net neg 1.4 L        02/18/2023    4:14 AM 02/17/2023    5:00 AM 02/16/2023    2:32 PM  Last 3 Weights  Weight (lbs) 126 lb 12.2 oz 126 lb 1.7 oz 126 lb 15.8 oz  Weight (kg) 57.5 kg 57.2 kg 57.6 kg      Telemetry    SR/ST  100 - Personally Reviewed  ECG    NO new - Personally Reviewed  Physical Exam   GEN: Sitting in chair  ON 7L O2 [place]: JVP is increased   Cardiac: RRR,  II/VI systolic murmur LSB    Respiratory: Rales bilaterally 1/3 up   GI: Soft, nontender, non-distended  Ext   NO LE edema   Labs    High Sensitivity Troponin:   Recent Labs  Lab 02/15/23 1729 02/15/23 1916  TROPONINIHS 46* 39*     Chemistry Recent Labs  Lab 02/15/23 1729 02/15/23 2022 02/16/23 1622 02/17/23 0312  NA 141 142 141 142  K 3.6 3.5 3.9 3.7  CL 104  --  108 108  CO2 25  --  25 25  GLUCOSE 100*  --  147* 106*  BUN 46*  --  42* 41*  CREATININE 1.81*  --  1.64* 1.55*  CALCIUM 8.6*  --  8.3* 8.1*  MG  --    --  2.3  --   PROT 7.2  --   --   --   ALBUMIN 3.9  --   --   --   AST 13*  --   --   --   ALT 9  --   --   --   ALKPHOS 65  --   --   --   BILITOT 0.7  --   --   --   GFRNONAA 25*  --  28* 30*  ANIONGAP 12  --  8 9    Lipids No results for input(s): "CHOL", "TRIG", "HDL", "LABVLDL", "LDLCALC", "CHOLHDL" in the last 168 hours.  Hematology Recent Labs  Lab 02/15/23 1729 02/15/23 2022 02/16/23 1622 02/17/23 0312  WBC 6.0  --  6.8 6.3  RBC 3.49*  --  3.69* 3.14*  HGB 10.8* 13.9 11.0* 9.7*  HCT 32.9* 41.0 35.2* 30.5*  MCV 94.3  --  95.4 97.1  MCH 30.9  --  29.8 30.9  MCHC 32.8  --  31.3 31.8  RDW 13.4  --  13.0 13.0  PLT 250  --  262 227   Thyroid No results for input(s): "TSH", "FREET4" in the last 168 hours.  BNP Recent Labs  Lab 02/15/23 1729  BNP 1,486.0*    DDimer No results for input(s): "DDIMER" in the last 168 hours.   Radiology    VAS [place] LOWER EXTREMITY VENOUS (DVT)  Result Date: 02/17/2023  Lower Venous DVT Study Patient Name:  Sara Garza  Date of Exam:   02/17/2023 Medical Rec #: [id number]       Accession #:    [id number] Date of Birth: [birth year]        Patient Gender: F Patient Age:   89 years Exam Location:  [facility] [address] Procedure:      VAS [place] LOWER EXTREMITY VENOUS (DVT) Referring Phys: [facility] GOEL --------------------------------------------------------------------------------  Indications: Pain, Swelling, Edema, and SOB.  Comparison Study: Previous study of left lower extremity on 10.14.2016. Performing Technologist: Fernande Bras  Examination Guidelines: A complete evaluation includes B-mode imaging, spectral Doppler, color Doppler, and power Doppler as needed of all accessible portions of each vessel. Bilateral testing is considered an integral part of a complete examination. Limited examinations for reoccurring indications may be performed as noted. The reflux portion of the exam is performed with the patient in reverse Trendelenburg.   +---------+---------------+---------+-----------+----------+--------------+ RIGHT    CompressibilityPhasicitySpontaneityPropertiesThrombus Aging +---------+---------------+---------+-----------+----------+--------------+ CFV      Full           Yes      Yes                                 +---------+---------------+---------+-----------+----------+--------------+ SFJ      Full                                                        +---------+---------------+---------+-----------+----------+--------------+ FV Prox  Full                                                        +---------+---------------+---------+-----------+----------+--------------+ FV Mid   Full                                                        +---------+---------------+---------+-----------+----------+--------------+ FV DistalFull                                                        +---------+---------------+---------+-----------+----------+--------------+ PFV      Full                                                        +---------+---------------+---------+-----------+----------+--------------+  POP      Full           Yes      Yes                                 +---------+---------------+---------+-----------+----------+--------------+ PTV      Full                                                        +---------+---------------+---------+-----------+----------+--------------+ PERO     Full                                                        +---------+---------------+---------+-----------+----------+--------------+   +---------+---------------+---------+-----------+----------+--------------+ LEFT     CompressibilityPhasicitySpontaneityPropertiesThrombus Aging +---------+---------------+---------+-----------+----------+--------------+ CFV      Full           Yes      Yes                                  +---------+---------------+---------+-----------+----------+--------------+ SFJ      Full                                                        +---------+---------------+---------+-----------+----------+--------------+ FV Prox  Full                                                        +---------+---------------+---------+-----------+----------+--------------+ FV Mid   Full                                                        +---------+---------------+---------+-----------+----------+--------------+ FV DistalFull                                                        +---------+---------------+---------+-----------+----------+--------------+ PFV      Full                                                        +---------+---------------+---------+-----------+----------+--------------+ POP      Full           Yes      Yes                                 +---------+---------------+---------+-----------+----------+--------------+  PTV      Full                                                        +---------+---------------+---------+-----------+----------+--------------+ PERO     Full                                                        +---------+---------------+---------+-----------+----------+--------------+     Summary: BILATERAL: - No evidence of deep vein thrombosis seen in the lower extremities, bilaterally. -No evidence of popliteal cyst, bilaterally.   *See table(s) above for measurements and observations. Electronically signed by Gerarda Fraction on 02/17/2023 at 4:19:27 PM.    Final    ECHOCARDIOGRAM COMPLETE  Result Date: 02/16/2023    ECHOCARDIOGRAM REPORT   Patient Name:   Sara Garza Date of Exam: 02/16/2023 Medical Rec #:  [id number]      Height:       59.0 in Accession #:    [id number]     Weight:       127.0 lb Date of Birth:  03-Dec-1923       BSA:          1.521 m Patient Age:    89 years       BP:           130/70 mmHg Patient  Gender: F              HR:           92 bpm. Exam Location:  Inpatient Procedure: 2D Echo, Cardiac Doppler and Color Doppler STAT ECHO Indications:    acute systolic chf.  History:        Patient has prior history of Echocardiogram examinations, most                 recent 04/15/2022. Chronic kidney disease, Arrythmias:LBBB; Risk                 Factors:Hypertension and Dyslipidemia.                 Aortic Valve: 23 mm Medtronic stented ([name]) valve is present in                 the aortic position. Procedure Date: 2014.  Sonographer:    Delcie Roch RDCS Referring Phys: [id number] Lanier Prude  Sonographer Comments: Image acquisition challenging due to respiratory motion. IMPRESSIONS  1. Left ventricular ejection fraction, by estimation, is 50 to 55%. The left ventricle has low normal function. The left ventricle demonstrates regional wall motion abnormalities- aneurysmal mid ventricular septum with no mural thrombus and stable apperance. There is moderate left ventricular hypertrophy. Left ventricular diastolic parameters are indeterminate.  2. Right ventricular systolic function is normal. The right ventricular size is normal. There is mildly elevated pulmonary artery systolic pressure. The estimated right ventricular systolic pressure is 43.8 mmHg.  3. Left atrial size was severely dilated.  4. The mitral valve is degenerative. Mild to moderate mitral valve regurgitation. Severe mitral stenosis. The mean mitral valve gradient is 13.8 mmHg with average heart rate of 89 bpm. Severe mitral annular calcification.  5. The aortic valve  has been repaired/replaced. Aortic valve regurgitation is mild. There is a 23 mm Medtronic stented ([name]) valve present in the aortic position. Procedure Date: 2014. Aortic valve area, by VTI measures 1.27 cm. Aortic valve mean gradient measures 18.5 mmHg. Aortic valve Vmax measures 2.86 m/s. Aortic valve acceleration time measures 79 msec, AV prosthesis is grossly stable, DVI  0.37.  6. The inferior vena cava is normal in size with <50% respiratory variability, suggesting right atrial pressure of 8 mmHg. FINDINGS  Left Ventricle: Left ventricular ejection fraction, by estimation, is 50 to 55%. The left ventricle has low normal function. The left ventricle demonstrates regional wall motion abnormalities. The left ventricular internal cavity size was normal in size. There is moderate left ventricular hypertrophy. Left ventricular diastolic parameters are indeterminate.  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 Right Ventricle: The right ventricular size is normal. No increase in right ventricular wall thickness. Right ventricular systolic function is normal. There is mildly elevated pulmonary artery systolic pressure. The tricuspid regurgitant velocity is 2.99  m/s, and with an assumed right atrial pressure of 8 mmHg, the estimated right ventricular systolic pressure is 43.8 mmHg. Left Atrium: Left atrial size was severely dilated. Right Atrium: Right atrial size was normal in size. Pericardium: Trivial pericardial effusion is present. Presence of epicardial fat layer. Mitral Valve: The mitral valve is degenerative in appearance. Severe mitral annular calcification. Mild to moderate mitral valve regurgitation. Severe mitral valve stenosis. MV peak gradient, 27.4 mmHg. The mean mitral valve gradient is 13.8 mmHg with average heart rate of 89 bpm. Tricuspid Valve: The tricuspid valve is normal in structure. Tricuspid valve regurgitation is trivial. No evidence of tricuspid stenosis. Aortic Valve: The aortic valve has been repaired/replaced. Aortic valve regurgitation is mild. Aortic valve mean gradient measures 18.5 mmHg. Aortic valve peak gradient measures 32.7 mmHg. Aortic valve area, by VTI measures 1.27 cm. There is a 23 mm Medtronic stented ([name]) valve present in the aortic position. Procedure Date: 2014. Pulmonic Valve: The pulmonic valve was normal in structure.  Pulmonic valve regurgitation is mild. No evidence of pulmonic stenosis. Aorta: The aortic root is normal in size and structure. Venous: The inferior vena cava is normal in size with less than 50% respiratory variability, suggesting right atrial pressure of 8 mmHg. IAS/Shunts: The atrial septum is grossly normal.  LEFT VENTRICLE PLAX 2D LVIDd:         5.10 cm LVIDs:         3.80 cm LV PW:         1.10 cm LV IVS:        1.10 cm LVOT diam:     2.10 cm LV SV:         71 LV SV Index:   47 LVOT Area:     3.46 cm  RIGHT VENTRICLE             IVC RV Basal diam:  2.70 cm     IVC diam: 1.90 cm RV S prime:     10.30 cm/s TAPSE (M-mode): 1.4 cm LEFT ATRIUM              Index        RIGHT ATRIUM           Index LA diam:        4.80 cm  3.16 cm/m   RA Area:     13.20 cm LA Vol (A2C):   89.6 ml  58.93 ml/m  RA Volume:  31.50 ml  20.72 ml/m LA Vol (A4C):   123.0 ml 80.89 ml/m LA Biplane Vol: 113.0 ml 74.31 ml/m  AORTIC VALVE AV Area (Vmax):    1.30 cm AV Area (Vmean):   1.18 cm AV Area (VTI):     1.27 cm AV Vmax:           285.76 cm/s AV Vmean:          216.081 cm/s AV VTI:            0.562 m AV Peak Grad:      32.7 mmHg AV Mean Grad:      18.5 mmHg LVOT Vmax:         107.25 cm/s LVOT Vmean:        73.575 cm/s LVOT VTI:          0.206 m LVOT/AV VTI ratio: 0.37  AORTA Ao Root diam: 2.80 cm Ao Asc diam:  2.80 cm MITRAL VALVE              TRICUSPID VALVE MV Peak grad: 27.4 mmHg   TR Peak grad:   35.8 mmHg MV Mean grad: 13.8 mmHg   TR Vmax:        299.00 cm/s MV Vmax:      2.62 m/s MV Vmean:     180.5 cm/s  SHUNTS MR Peak grad: 59.5 mmHg   Systemic VTI:  0.21 m MR Mean grad: 44.0 mmHg   Systemic Diam: 2.10 cm MR Vmax:      385.67 cm/s MR Vmean:     267.0 cm/s Weston Brass MD Electronically signed by Weston Brass MD Signature Date/Time: 02/16/2023/6:08:12 PM    Final     Cardiac Studies    1. Left ventricular ejection fraction, by estimation, is 50 to 55%. The  left ventricle has low normal function. The left  ventricle demonstrates  regional wall motion abnormalities- aneurysmal mid ventricular septum with  no mural thrombus and stable  apperance. There is moderate left ventricular hypertrophy. Left  ventricular diastolic parameters are indeterminate.   2. Right ventricular systolic function is normal. The right ventricular  size is normal. There is mildly elevated pulmonary artery systolic  pressure. The estimated right ventricular systolic pressure is 43.8 mmHg.   3. Left atrial size was severely dilated.   4. The mitral valve is degenerative. Mild to moderate mitral valve  regurgitation. Severe mitral stenosis. The mean mitral valve gradient is  13.8 mmHg with average heart rate of 89 bpm. Severe mitral annular  calcification.   5. The aortic valve has been repaired/replaced. Aortic valve  regurgitation is mild. There is a 23 mm Medtronic stented ([name]) valve  present in the aortic position. Procedure Date: 2014. Aortic valve area,  by VTI measures 1.27 cm. Aortic valve mean  gradient measures 18.5 mmHg. Aortic valve Vmax measures 2.86 m/s. Aortic  valve acceleration time measures 79 msec, AV prosthesis is grossly stable,  DVI 0.37.   6. The inferior vena cava is normal in size with <50% respiratory  variability, suggesting right atrial pressure of 8 mmHg.    Patient Profile   89 yo with hx of CAD, HFpEF, CKD2, DM, HTN.   She is s/p [name] for AS  Hx MS and MR    Admitted 02/15/23 for sudden onset SOB since Thursday.     Assessment & Plan    1  MV dz  Pt with severe MS   Mean gradient through the valve 13.8 mm HG  There is probably moderate MR      The mitral stenosis has been known to be severe, seen on previous echo in Jan 2024  Unfortunately she is not a candidate for intervention for this    Cr is higher today 1.99   hold further diuretics and repeat  As BP allows would use metoprolol for HR control, allow more diastolic filling     2  HFpEF   Pt remains volume overloaded    Holding lasix  for now given bump in Cr      REpeat BMET in PM      3  AV dz  Pt is s/p [name] in 2015 with 23 mm Medtronic Corevalve  Mean gradient is 18.5  Stable from previous echo in 04/2022      4  CAD  REmote intervention   Denies CP  Trop 46, 39       I have spoken with pt and daughter about pathophysiology of mitral stenosis      I am worried she may not improve with medical Rx. On speaking with daughter last night I would reocmm palliative care to see patient as well     Goal to keep her comfortable, diurese as able       For questions or updates, please contact [facility] Please consult [email or web address] for contact info under        Signed, Dietrich Pates, MD  02/18/2023, 6:16 AM

## 2023-02-19 DIAGNOSIS — I10 Essential (primary) hypertension: Secondary | ICD-10-CM | POA: Diagnosis not present

## 2023-02-19 DIAGNOSIS — N1832 Chronic kidney disease, stage 3b: Secondary | ICD-10-CM | POA: Diagnosis not present

## 2023-02-19 DIAGNOSIS — Z515 Encounter for palliative care: Secondary | ICD-10-CM | POA: Diagnosis not present

## 2023-02-19 DIAGNOSIS — Z7189 Other specified counseling: Secondary | ICD-10-CM | POA: Diagnosis not present

## 2023-02-19 DIAGNOSIS — Z66 Do not resuscitate: Secondary | ICD-10-CM

## 2023-02-19 DIAGNOSIS — I5033 Acute on chronic diastolic (congestive) heart failure: Secondary | ICD-10-CM | POA: Diagnosis not present

## 2023-02-19 DIAGNOSIS — J9601 Acute respiratory failure with hypoxia: Secondary | ICD-10-CM | POA: Diagnosis not present

## 2023-02-19 LAB — CBC WITH DIFFERENTIAL/PLATELET
Abs Immature Granulocytes: 0.02 10*3/uL (ref 0.00–0.07)
Basophils Absolute: 0 10*3/uL (ref 0.0–0.1)
Basophils Relative: 1 %
Eosinophils Absolute: 0.4 10*3/uL (ref 0.0–0.5)
Eosinophils Relative: 7 %
HCT: 34.2 % — ABNORMAL LOW (ref 36.0–46.0)
Hemoglobin: 10.4 g/dL — ABNORMAL LOW (ref 12.0–15.0)
Immature Granulocytes: 0 %
Lymphocytes Relative: 10 %
Lymphs Abs: 0.7 10*3/uL (ref 0.7–4.0)
MCH: 29.4 pg (ref 26.0–34.0)
MCHC: 30.4 g/dL (ref 30.0–36.0)
MCV: 96.6 fL (ref 80.0–100.0)
Monocytes Absolute: 0.7 10*3/uL (ref 0.1–1.0)
Monocytes Relative: 11 %
Neutro Abs: 4.5 10*3/uL (ref 1.7–7.7)
Neutrophils Relative %: 71 %
Platelets: 261 10*3/uL (ref 150–400)
RBC: 3.54 MIL/uL — ABNORMAL LOW (ref 3.87–5.11)
RDW: 12.8 % (ref 11.5–15.5)
WBC: 6.3 10*3/uL (ref 4.0–10.5)
nRBC: 0 % (ref 0.0–0.2)

## 2023-02-19 LAB — FERRITIN: Ferritin: 92 ng/mL (ref 11–307)

## 2023-02-19 LAB — GLUCOSE, CAPILLARY
Glucose-Capillary: 105 mg/dL — ABNORMAL HIGH (ref 70–99)
Glucose-Capillary: 145 mg/dL — ABNORMAL HIGH (ref 70–99)
Glucose-Capillary: 138 mg/dL — ABNORMAL HIGH (ref 70–99)
Glucose-Capillary: 183 mg/dL — ABNORMAL HIGH (ref 70–99)

## 2023-02-19 LAB — BASIC METABOLIC PANEL
Anion gap: 9 (ref 5–15)
BUN: 52 mg/dL — ABNORMAL HIGH (ref 8–23)
CO2: 29 mmol/L (ref 22–32)
Calcium: 8.8 mg/dL — ABNORMAL LOW (ref 8.9–10.3)
Chloride: 105 mmol/L (ref 98–111)
Creatinine, Ser: 2.26 mg/dL — ABNORMAL HIGH (ref 0.44–1.00)
GFR, Estimated: 19 mL/min — ABNORMAL LOW (ref >60)
Glucose, Bld: 176 mg/dL — ABNORMAL HIGH (ref 70–99)
Potassium: 4.2 mmol/L (ref 3.5–5.1)
Sodium: 143 mmol/L (ref 135–145)

## 2023-02-19 LAB — FOLATE: Folate: >40 ng/mL (ref >5.9)

## 2023-02-19 LAB — IRON AND TIBC
Iron: 26 ug/dL — ABNORMAL LOW (ref 28–170)
Saturation Ratios: 12 % (ref 10.4–31.8)
TIBC: 227 ug/dL — ABNORMAL LOW (ref 250–450)
UIBC: 201 ug/dL

## 2023-02-19 LAB — VITAMIN B12: Vitamin B-12: 569 pg/mL (ref 180–914)

## 2023-02-19 LAB — RETICULOCYTES
Immature Retic Fract: 9.5 % (ref 2.3–15.9)
RBC.: 3.47 MIL/uL — ABNORMAL LOW (ref 3.87–5.11)
Retic Count, Absolute: 43 10*3/uL (ref 19.0–186.0)
Retic Ct Pct: 1.2 % (ref 0.4–3.1)

## 2023-02-19 NOTE — Progress Notes (Signed)
Physical Therapy Treatment Patient Details Name: Sara Garza MRN: [id number] DOB: Apr 10, 1923 Today's Date: 02/19/2023   History of Present Illness Pt is 89 year old presented to [facility] on 02/15/23 for worsening SOB and BLE edema. Transferred to [facility] on  02/16/23. Pt with acute on chronic diastolic CHF. Pt also with severe mitral stenosis. PMH - CAD, CKD, DM, HTN, CHF, [name], Bil THR    PT Comments  Pt is progressing towards goals. Currently pt is limited by respiratory status and fatigue. Pt was able to perform very short less than home distance gait at 2 person assist with chair follow due to fatigue. Pt was able to perform sit to stand and bed mobility at Min A. Due to pt current functional status, home set up and available assistance at home recommending skilled physical therapy services < 3 hours/day on discharge from acute care hospital setting in order to decrease risk for falls, injury and re-hospitalization. Pt tolerated treatment session well on 8L O2 via [place].   If plan is discharge home, recommend the following: Assistance with cooking/housework;Assist for transportation;Help with stairs or ramp for entrance;A little help with walking and/or transfers   Can travel by private vehicle     No  Equipment Recommendations  Wheelchair (measurements PT);Wheelchair cushion (measurements PT)       Precautions / Restrictions Precautions Precautions: Fall;Other (comment) Precaution Comments: watch SpO2 and HR Restrictions Weight Bearing Restrictions: No     Mobility  Bed Mobility Overal bed mobility: Needs Assistance Bed Mobility: Supine to Sit     Supine to sit: Min assist, HOB elevated     General bed mobility comments: Assist to elevate trunk into sitting and bring hips to EOB    Transfers Overall transfer level: Needs assistance Equipment used: Rolling walker (2 wheels) Transfers: Sit to/from Stand, Bed to chair/wheelchair/BSC Sit to Stand: Min assist   Step  pivot transfers: Min assist       General transfer comment: light lift and steadying assist    Ambulation/Gait Ambulation/Gait assistance: Contact guard assist, Min assist, +2 safety/equipment Gait Distance (Feet): 25 Feet Assistive device: Rolling walker (2 wheels) Gait Pattern/deviations: Step-through pattern, Decreased step length - right, Decreased step length - left, Narrow base of support Gait velocity: decreased Gait velocity interpretation: <1.8 ft/sec, indicate of risk for recurrent falls   General Gait Details: Pt requires a youth walker; only adult walker available. Pt did well with adult walker but reports she uses a youth walker at home. HR remained 115 bpm and below, O2 sats over 90% on 8L O2 via [place]. Pt was limited by fatigue - chair follow for safety        Balance Overall balance assessment: Needs assistance Sitting-balance support: No upper extremity supported, Feet supported Sitting balance-Leahy Scale: Fair     Standing balance support: Bilateral upper extremity supported, During functional activity, Reliant on assistive device for balance Standing balance-Leahy Scale: Fair Standing balance comment: walker and CGA for static standing and ambulation        Cognition Arousal: Alert Behavior During Therapy: WFL for tasks assessed/performed Overall Cognitive Status: Within Functional Limits for tasks assessed               General Comments General comments (skin integrity, edema, etc.): Pt on 8L O2 via [place] with SPO2 above 90% during functional activities and HR 115 bpm and below throughout gait.      Pertinent Vitals/Pain Pain Assessment Pain Assessment: No/denies pain     PT  Goals (current goals can now be found in the care plan section) Acute Rehab PT Goals Patient Stated Goal: return home PT Goal Formulation: With patient Time For Goal Achievement: 03/03/23 Potential to Achieve Goals: Fair Progress towards PT goals: Progressing toward  goals    Frequency    Min 1X/week      PT Plan  Continue with current POC       AM-PAC PT "6 Clicks" Mobility   Outcome Measure  Help needed turning from your back to your side while in a flat bed without using bedrails?: A Little Help needed moving from lying on your back to sitting on the side of a flat bed without using bedrails?: A Little Help needed moving to and from a bed to a chair (including a wheelchair)?: A Little Help needed standing up from a chair using your arms (e.g., wheelchair or bedside chair)?: A Little Help needed to walk in hospital room?: Total Help needed climbing 3-5 steps with a railing? : Total 6 Click Score: 14    End of Session Equipment Utilized During Treatment: Oxygen;Gait belt Activity Tolerance: Patient limited by fatigue;Patient tolerated treatment well Patient left: in chair;with call bell/phone within reach Nurse Communication: Mobility status PT Visit Diagnosis: Unsteadiness on feet (R26.81);Other abnormalities of gait and mobility (R26.89);Muscle weakness (generalized) (M62.81)     Time: 3151-7616 PT Time Calculation (min) (ACUTE ONLY): 23 min  Charges:    $Therapeutic Activity: 23-37 mins PT General Charges $$ ACUTE PT VISIT: 1 Visit                    Harrel Carina, DPT, CLT  Acute Rehabilitation Services Office: [phone] (Secure chat preferred)    Claudia Desanctis 02/19/2023, 12:15 PM

## 2023-02-19 NOTE — Care Management Important Message (Signed)
Important Message  Patient Details  Name: Sara Garza MRN: [id number] Date of Birth: 1923-11-02   Important Message Given:  Yes - Medicare IM     Dorena Bodo 02/19/2023, 11:50 AM

## 2023-02-19 NOTE — Plan of Care (Signed)

## 2023-02-19 NOTE — Progress Notes (Signed)
[facility]  PROGRESS NOTE  ARDA YOH WUJ:[id number] DOB: 09/10/1923 DOA: 02/15/2023 PCP: Shirline Frees, NP   Brief HPI:    89 y.o. female with medical history significant of congestive heart failure, preserved ejection fraction, prior known history of coronary artery disease chronic kidney disease type 2 diabetes mellitus, hypertension as well as prior transcatheter aortic valve replacement presents with acute diastolic CHF. She was started on IV lasix , without much improvement. Lasix held today for AKI. Palliative care consulted for goals of care.     Assessment/Plan:   Acute on chronic diastolic CHF -Initially started on Lasix IV 40 mg twice daily -Creatinine held due to worsening renal function -Echocardiogram showed preserved LVEF but severe MS -Cardiology consulted  Mitral valve disease -Not a candidate for intervention as per cardiology -Diuresis held due to worsening renal function -Start metoprolol  and blood pressure is stable to allow for more diastolic filling  Acute hypoxemic respiratory failure -Still requiring   L/min oxygen HFNC -Completed treatment for acute bronchitis with Zithromax  Acute on CKD stage IIIb -Creatinine up to 2.48 with diuresis  Hypertension -Blood pressure well-controlled  History of [name] -Stable on repeat echocardiogram  Diabetes mellitus type 2 -Insulin sliding scale with NovoLog -CBG well-controlled  Hypokalemia -Replete  Anemia of chronic disease -Baseline hemoglobin between 10 to 12 -Hemoglobin is 10.4 today.  Goals of care Palliative care consulted for goals of care Patient is DNR Family meeting scheduled for today at noon time  Medications     aspirin EC  81 mg Oral Daily   enoxaparin (LOVENOX) injection  30 mg Subcutaneous Q24H   ezetimibe  10 mg Oral Daily   insulin aspart  0-9 Units Subcutaneous TID WC     Data Reviewed:   CBG:  Recent Labs  Lab 02/18/23 1101 02/18/23 1554 02/18/23 2108  02/19/23 0609 02/19/23 1317  GLUCAP 174* 112* 114* 105* 145*    SpO2: 93 % O2 Flow Rate (L/min): 8 L/min    Vitals:   02/19/23 0256 02/19/23 0400 02/19/23 0714 02/19/23 1313  BP: (!) 104/59  114/62 113/64  Pulse: (!) 103  91 77  Resp: 20  (!) 21 20  Temp: 97.7 F (36.5 C)   97.6 F (36.4 C)  TempSrc: Oral  Oral Oral  SpO2: 92%  96% 93%  Weight:  56.1 kg    Height:          Data Reviewed:  Basic Metabolic Panel: Recent Labs  Lab 02/16/23 1622 02/17/23 0312 02/18/23 1059 02/18/23 1622 02/19/23 0837  NA 141 142 141 139 143  K 3.9 3.7 3.4* 3.5 4.2  CL 108 108 102 102 105  CO2 25 25 28 26 29   GLUCOSE 147* 106* 164* 105* 176*  BUN 42* 41* 46* 54* 52*  CREATININE 1.64* 1.55* 1.99* 2.48* 2.26*  CALCIUM 8.3* 8.1* 8.4* 8.4* 8.8*  MG 2.3  --   --   --   --   PHOS  --  4.8*  --   --   --     CBC: Recent Labs  Lab 02/15/23 1729 02/15/23 2022 02/16/23 1622 02/17/23 0312 02/19/23 0837  WBC 6.0  --  6.8 6.3 6.3  NEUTROABS 4.1  --   --   --  4.5  HGB 10.8* 13.9 11.0* 9.7* 10.4*  HCT 32.9* 41.0 35.2* 30.5* 34.2*  MCV 94.3  --  95.4 97.1 96.6  PLT 250  --  262 227 261    LFT Recent  Labs  Lab 02/15/23 1729  AST 13*  ALT 9  ALKPHOS 65  BILITOT 0.7  PROT 7.2  ALBUMIN 3.9     Antibiotics: Anti-infectives (From admission, onward)    Start     Dose/Rate Route Frequency Ordered Stop   02/17/23 1700  azithromycin (ZITHROMAX) tablet 500 mg        500 mg Oral Daily 02/17/23 1031 02/18/23 1557   02/16/23 1630  azithromycin (ZITHROMAX) 500 mg in sodium chloride 0.9 % 250 mL IVPB  Status:  Discontinued        500 mg 255 mL/hr over 60 Minutes Intravenous Every 24 hours 02/16/23 1539 02/17/23 1031   02/16/23 1615  cefTRIAXone (ROCEPHIN) 1 g in sodium chloride 0.9 % 100 mL IVPB  Status:  Discontinued        1 g 200 mL/hr over 30 Minutes Intravenous Every 24 hours 02/16/23 1529 02/17/23 1030   02/16/23 1615  azithromycin (ZITHROMAX) 500 mg in dextrose 5 % 250 mL  IVPB  Status:  Discontinued        500 mg 250 mL/hr over 60 Minutes Intravenous Every 24 hours 02/16/23 1529 02/16/23 1539        DVT prophylaxis: Lovenox  Code Status: DNR  Family Communication: No family at bedside   CONSULTS    Subjective   Patient seen and examined, denies chest pain or shortness of breath.   Objective    Physical Examination:  General-appears in no acute distress Heart-S1-S2, regular, no murmur auscultated Lungs-clear to auscultation bilaterally, no wheezing or crackles auscultated Abdomen-soft, nontender, no organomegaly Extremities-no edema in the lower extremities Neuro-alert, oriented x3, no focal deficit noted  Status is: Inpatient:             Meredeth Ide   [facility] If 7PM-7AM, please contact night-coverage at [email or web address], Office  [phone]   02/19/2023, 2:15 PM  LOS: 3 days

## 2023-02-19 NOTE — Progress Notes (Addendum)
Palliative:  HPI: 89 y.o. female  with past medical history of HFpEF, CAD, severe to moderate mitral valve stenosis, HTN, s/p [name], CKD stage 2, diabetes admitted on 02/15/2023 with shortness of breath related to severe mitral valve stenosis. Diuresis recommended but limited by renal function.    I met today with Sara Garza and daughter, Sara Garza, over speaker phone. We had full discussion of heart disease and diuresis limited by kidney function. We discussed the potential that she will need ongoing oxygen support. We discussed that her new baseline is yet to be seen. They would like to continue treatment with hopes of optimizing her fluid status and functional status. She is hopeful for rehab stay to continue to optimize and support her.   We discussed in detail outpatient palliative vs hospice support. Unfortunately outpatient palliative care resources are limited and visits are not very frequent. We discussed that given the severity of her illness she would be eligible for hospice support at home which would be much more support and a good resource to help her optimize her quality of life. We discussed the difference of hospice at home vs hospice facility (facility is comfort care in the last days to weeks of life and full comfort care). They both express understanding and although information and poor prognosis is overwhelming they find the information helpful. They would be open to hospice support. They are open to further palliative conversation over the coming days depending on progress to assist with best options moving forward.   We also clarified code status as initially open to intubation. We discussed expectations with intubation in the setting of her severe health issues. We determine that she would be unlikely to have a good outcome from intubation and has decided she would not want intubation but open to oxygen and medications to support and prevent from worsening to that point. DNR/DNI clarified  and order adjusted.   All questions/concerns addressed. Emotional support provided.   Exam: Alert, oriented. Sitting up in recliner. Regular rate. Breathing regular, unlabored at rest - slightly winded with conversation. Abd soft. Moves all extremities.   Plan: - DNR/DNI - Time for outcomes - SNF with palliative most likely next step   75 min  Yong Channel, NP Palliative Medicine Team Pager [phone] (Please see [email or web address] for schedule) Team Phone [phone]    Greater than 50%  of this time was spent counseling and coordinating care related to the above assessment and plan

## 2023-02-19 NOTE — Progress Notes (Signed)
Rounding Note    Patient Name: Sara Garza Date of Encounter: 02/19/2023  [facility] Cardiologist: Tonny Bollman, MD   Subjective    Pt sitting in chair   On high flow [place]    No CP   Mild SOB  Inpatient Medications    Scheduled Meds:  aspirin EC  81 mg Oral Daily   enoxaparin (LOVENOX) injection  30 mg Subcutaneous Q24H   ezetimibe  10 mg Oral Daily   insulin aspart  0-9 Units Subcutaneous TID WC   Continuous Infusions:   Vital Signs    Vitals:   02/18/23 1921 02/18/23 2313 02/19/23 0256 02/19/23 0400  BP: (!) 129/58 108/63 (!) 104/59   Pulse: (!) 106 (!) 106 (!) 103   Resp: 20 18 20    Temp: 98.1 F (36.7 C) 98.3 F (36.8 C) 97.7 F (36.5 C)   TempSrc: Oral Oral Oral   SpO2: 90% 92% 92%   Weight:    56.1 kg  Height:        Intake/Output Summary (Last 24 hours) at 02/19/2023 0656 Last data filed at 02/19/2023 0309 Gross per 24 hour  Intake --  Output 350 ml  Net -350 ml    Net neg 2 L        02/19/2023    4:00 AM 02/18/2023    4:14 AM 02/17/2023    5:00 AM  Last 3 Weights  Weight (lbs) 123 lb 10.9 oz 126 lb 12.2 oz 126 lb 1.7 oz  Weight (kg) 56.1 kg 57.5 kg 57.2 kg      Telemetry    SR/ST - Personally Reviewed  ECG    NO new - Personally Reviewed  Physical Exam  Exam is unchanged    GEN: Sitting in chair  ON 7L O2 [place]ck: JVP is increased   Cardiac: RRR,  II/VI systolic murmur LSB    Respiratory: [place] bilaterally 1/3 up   GI: Soft, nontender, non-distended  Ext   NO LE edema   Labs    High Sensitivity Troponin:   Recent Labs  Lab 02/15/23 1729 02/15/23 1916  TROPONINIHS 46* 39*     Chemistry Recent Labs  Lab 02/15/23 1729 02/15/23 2022 02/16/23 1622 02/17/23 0312 02/18/23 1059 02/18/23 1622  NA 141   < > 141 142 141 139  K 3.6   < > 3.9 3.7 3.4* 3.5  CL 104  --  108 108 102 102  CO2 25  --  25 25 28 26   GLUCOSE 100*  --  147* 106* 164* 105*  BUN 46*  --  42* 41* 46* 54*  CREATININE 1.81*  --  1.64*  1.55* 1.99* 2.48*  CALCIUM 8.6*  --  8.3* 8.1* 8.4* 8.4*  MG  --   --  2.3  --   --   --   PROT 7.2  --   --   --   --   --   ALBUMIN 3.9  --   --   --   --   --   AST 13*  --   --   --   --   --   ALT 9  --   --   --   --   --   ALKPHOS 65  --   --   --   --   --   BILITOT 0.7  --   --   --   --   --   ZOXWRUEA  25*  --  28* 30* 22* 17*  ANIONGAP 12  --  8 9 11 11    < > = values in this interval not displayed.    Lipids No results for input(s): "CHOL", "TRIG", "HDL", "LABVLDL", "LDLCALC", "CHOLHDL" in the last 168 hours.  Hematology Recent Labs  Lab 02/15/23 1729 02/15/23 2022 02/16/23 1622 02/17/23 0312  WBC 6.0  --  6.8 6.3  RBC 3.49*  --  3.69* 3.14*  HGB 10.8* 13.9 11.0* 9.7*  HCT 32.9* 41.0 35.2* 30.5*  MCV 94.3  --  95.4 97.1  MCH 30.9  --  29.8 30.9  MCHC 32.8  --  31.3 31.8  RDW 13.4  --  13.0 13.0  PLT 250  --  262 227   Thyroid No results for input(s): "TSH", "FREET4" in the last 168 hours.  BNP Recent Labs  Lab 02/15/23 1729  BNP 1,486.0*    DDimer No results for input(s): "DDIMER" in the last 168 hours.   Radiology    VAS [place] LOWER EXTREMITY VENOUS (DVT)  Result Date: 02/17/2023  Lower Venous DVT Study Patient Name:  Sara Garza  Date of Exam:   02/17/2023 Medical Rec #: [id number]       Accession #:    [id number] Date of Birth: [birth year]        Patient Gender: F Patient Age:   89 years Exam Location:  [facility] Procedure:      VAS [place] LOWER EXTREMITY VENOUS (DVT) Referring Phys: [facility] GOEL --------------------------------------------------------------------------------  Indications: Pain, Swelling, Edema, and SOB.  Comparison Study: Previous study of left lower extremity on 10.14.2016. Performing Technologist: Fernande Bras  Examination Guidelines: A complete evaluation includes B-mode imaging, spectral Doppler, color Doppler, and power Doppler as needed of all accessible portions of each vessel. Bilateral testing is considered an integral part  of a complete examination. Limited examinations for reoccurring indications may be performed as noted. The reflux portion of the exam is performed with the patient in reverse Trendelenburg.  +---------+---------------+---------+-----------+----------+--------------+ RIGHT    CompressibilityPhasicitySpontaneityPropertiesThrombus Aging +---------+---------------+---------+-----------+----------+--------------+ CFV      Full           Yes      Yes                                 +---------+---------------+---------+-----------+----------+--------------+ SFJ      Full                                                        +---------+---------------+---------+-----------+----------+--------------+ FV Prox  Full                                                        +---------+---------------+---------+-----------+----------+--------------+ FV Mid   Full                                                        +---------+---------------+---------+-----------+----------+--------------+ FV DistalFull                                                        +---------+---------------+---------+-----------+----------+--------------+  PFV      Full                                                        +---------+---------------+---------+-----------+----------+--------------+ POP      Full           Yes      Yes                                 +---------+---------------+---------+-----------+----------+--------------+ PTV      Full                                                        +---------+---------------+---------+-----------+----------+--------------+ PERO     Full                                                        +---------+---------------+---------+-----------+----------+--------------+   +---------+---------------+---------+-----------+----------+--------------+ LEFT     CompressibilityPhasicitySpontaneityPropertiesThrombus Aging  +---------+---------------+---------+-----------+----------+--------------+ CFV      Full           Yes      Yes                                 +---------+---------------+---------+-----------+----------+--------------+ SFJ      Full                                                        +---------+---------------+---------+-----------+----------+--------------+ FV Prox  Full                                                        +---------+---------------+---------+-----------+----------+--------------+ FV Mid   Full                                                        +---------+---------------+---------+-----------+----------+--------------+ FV DistalFull                                                        +---------+---------------+---------+-----------+----------+--------------+ PFV      Full                                                        +---------+---------------+---------+-----------+----------+--------------+  POP      Full           Yes      Yes                                 +---------+---------------+---------+-----------+----------+--------------+ PTV      Full                                                        +---------+---------------+---------+-----------+----------+--------------+ PERO     Full                                                        +---------+---------------+---------+-----------+----------+--------------+     Summary: BILATERAL: - No evidence of deep vein thrombosis seen in the lower extremities, bilaterally. -No evidence of popliteal cyst, bilaterally.   *See table(s) above for measurements and observations. Electronically signed by Gerarda Fraction on 02/17/2023 at 4:19:27 PM.    Final     Cardiac Studies    1. Left ventricular ejection fraction, by estimation, is 50 to 55%. The  left ventricle has low normal function. The left ventricle demonstrates  regional wall motion abnormalities-  aneurysmal mid ventricular septum with  no mural thrombus and stable  apperance. There is moderate left ventricular hypertrophy. Left  ventricular diastolic parameters are indeterminate.   2. Right ventricular systolic function is normal. The right ventricular  size is normal. There is mildly elevated pulmonary artery systolic  pressure. The estimated right ventricular systolic pressure is 43.8 mmHg.   3. Left atrial size was severely dilated.   4. The mitral valve is degenerative. Mild to moderate mitral valve  regurgitation. Severe mitral stenosis. The mean mitral valve gradient is  13.8 mmHg with average heart rate of 89 bpm. Severe mitral annular  calcification.   5. The aortic valve has been repaired/replaced. Aortic valve  regurgitation is mild. There is a 23 mm Medtronic stented ([name]) valve  present in the aortic position. Procedure Date: 2014. Aortic valve area,  by VTI measures 1.27 cm. Aortic valve mean  gradient measures 18.5 mmHg. Aortic valve Vmax measures 2.86 m/s. Aortic  valve acceleration time measures 79 msec, AV prosthesis is grossly stable,  DVI 0.37.   6. The inferior vena cava is normal in size with <50% respiratory  variability, suggesting right atrial pressure of 8 mmHg.    Patient Profile   89 yo with hx of CAD, HFpEF, CKD2, DM, HTN.   She is s/p [name] for AS  Hx MS and MR    Admitted 02/15/23 for sudden onset SOB since Thursday.     Assessment & Plan    1  MV dz  Pt with severe MS   Mean gradient through the valve 13.8 mm HG  There is probably moderate MR      The mitral stenosis has been known to be severe, seen on previous echo in Jan 2024  Unfortunately she is not a candidate for intervention for this    She has diuresed some but Lasix on hold due to bump in Cr   It is improving  now    May move to just oral lasix      I reviewed with patient limitations in Rx    Goal to keep her as comfortable as possible        2  HFpEF  Remains volume overloaded     Lasix still on hold due to bump in Cr      Follow up in am    3  AV dz  Pt is s/p [name] in 2015 with 23 mm Medtronic Corevalve  Mean gradient is 18.5  Stable from previous echo in 04/2022      4  CAD  REmote intervention   Denies CP  Trop 46, 39      5  Disposition  Palliative care has seen pt    She should go to fulll care facility        For questions or updates, please contact [facility] Please consult [email or web address] for contact info under        Signed, Dietrich Pates, MD  02/19/2023, 6:56 AM

## 2023-02-20 DIAGNOSIS — N1832 Chronic kidney disease, stage 3b: Secondary | ICD-10-CM | POA: Diagnosis not present

## 2023-02-20 DIAGNOSIS — I10 Essential (primary) hypertension: Secondary | ICD-10-CM | POA: Diagnosis not present

## 2023-02-20 DIAGNOSIS — I5033 Acute on chronic diastolic (congestive) heart failure: Secondary | ICD-10-CM | POA: Diagnosis not present

## 2023-02-20 DIAGNOSIS — J9601 Acute respiratory failure with hypoxia: Secondary | ICD-10-CM | POA: Diagnosis not present

## 2023-02-20 LAB — BRAIN NATRIURETIC PEPTIDE: B Natriuretic Peptide: 1358 pg/mL — ABNORMAL HIGH (ref 0.0–100.0)

## 2023-02-20 LAB — BASIC METABOLIC PANEL
Anion gap: 10 (ref 5–15)
BUN: 57 mg/dL — ABNORMAL HIGH (ref 8–23)
CO2: 25 mmol/L (ref 22–32)
Calcium: 8.6 mg/dL — ABNORMAL LOW (ref 8.9–10.3)
Chloride: 105 mmol/L (ref 98–111)
Creatinine, Ser: 2.3 mg/dL — ABNORMAL HIGH (ref 0.44–1.00)
GFR, Estimated: 19 mL/min — ABNORMAL LOW (ref >60)
Glucose, Bld: 85 mg/dL (ref 70–99)
Potassium: 4.7 mmol/L (ref 3.5–5.1)
Sodium: 140 mmol/L (ref 135–145)

## 2023-02-20 LAB — GLUCOSE, CAPILLARY
Glucose-Capillary: 95 mg/dL (ref 70–99)
Glucose-Capillary: 97 mg/dL (ref 70–99)
Glucose-Capillary: 145 mg/dL — ABNORMAL HIGH (ref 70–99)
Glucose-Capillary: 129 mg/dL — ABNORMAL HIGH (ref 70–99)

## 2023-02-20 NOTE — NC FL2 (Signed)
[place] MEDICAID FL2 LEVEL OF CARE FORM     IDENTIFICATION  Patient Name: Sara Garza [facility] Birthdate: [birth year] Sex: female Admission Date (Current Location): 02/15/2023  [facility] and [place] Number:  Producer, television/film/video and Address:  [facility], [address], [place], [place] [address]      Provider Number: [id number]  Attending Physician Name and Address:  Meredeth Ide, MD  Relative Name and Phone Number:  Tyquesha Mcconathy; Daughter; [phone]    Current Level of Care: Hospital Recommended Level of Care: Skilled Nursing Facility Prior Approval Number:    Date Approved/Denied:   PASRR Number: [id number]  Discharge Plan: SNF    Current Diagnoses: Patient Active Problem List   Diagnosis Date Noted   Trigger finger, acquired 12/19/2021   AC (acromioclavicular) arthritis 06/27/2021   Right rotator cuff tear arthropathy 05/16/2021   Acute CHF (congestive heart failure) (HCC) 02/14/2020   Acute blood loss anemia 01/03/2020   Eye irritation 01/01/2020   Mitral valve stenosis    Benign essential HTN    Chronic diastolic CHF (congestive heart failure) (HCC)    Hip fracture (HCC) 12/30/2019   Closed fracture of femur, intertrochanteric, left, initial encounter (HCC)    Acute on chronic diastolic CHF (congestive heart failure) (HCC) 02/25/2018   S/P [name] (transcatheter aortic valve replacement) 02/25/2018   Leg pain    Hyperlipidemia    Glaucoma    Carotid stenosis, left    Arthritis    Urinary incontinence 02/12/2017   Chronic bilateral low back pain 08/12/2016   Spondylosis of lumbar joint 08/12/2016   Bilateral leg pain 08/12/2016   Neck mass 04/15/2015   Status post hip hemiarthroplasty 07/31/2013   CAD (coronary artery disease) 07/31/2013   CKD (chronic kidney disease), stage III (HCC) 07/31/2013   Left bundle branch block 07/31/2013   Severe calcific aortic stenosis 06/18/2010   Diabetic polyneuropathy (HCC) 04/16/2010   GLAUCOMA  01/06/2009   CHEST PAIN, ATYPICAL 12/16/2008   Bilateral shoulder pain 11/16/2007   Osteoarthritis 02/02/2007   Type 2 diabetes mellitus with renal manifestations, controlled (HCC) 10/24/2006   Dyslipidemia 10/02/2006   Essential hypertension 10/02/2006    Orientation RESPIRATION BLADDER Height & Weight     Self, Time, Situation, Place  O2 (HFNC 4.5L) Incontinent, External catheter Weight: 124 lb 12.5 oz (56.6 kg) Height:  4\' 11"  (149.9 cm)  BEHAVIORAL SYMPTOMS/MOOD NEUROLOGICAL BOWEL NUTRITION STATUS      Continent Diet (Please see dc summary)  AMBULATORY STATUS COMMUNICATION OF NEEDS Skin   Extensive Assist Verbally Normal                       Personal Care Assistance Level of Assistance  Bathing, Feeding, Dressing Bathing Assistance: Maximum assistance Feeding assistance: Maximum assistance Dressing Assistance: Maximum assistance     Functional Limitations Info  Sight, Hearing Sight Info: Impaired (Reading glasses) Hearing Info: Impaired (R and L)      SPECIAL CARE FACTORS FREQUENCY  PT (By licensed PT), OT (By licensed OT)     PT Frequency: 5x OT Frequency: 5x            Contractures Contractures Info: Present    Additional Factors Info  Code Status, Allergies Code Status Info: (DNR) -DNR-LIMITED -Do Not Intubate/DNI Allergies Info: Statins and Gabapentin           Current Medications (02/20/2023):  This is the current hospital active medication list Current Facility-Administered Medications  Medication Dose Route  Frequency Provider Last Rate Last Admin   acetaminophen (TYLENOL) tablet 650 mg  650 mg Oral Q6H PRN Nolberto Hanlon, MD   650 mg at 02/18/23 2122   Or   acetaminophen (TYLENOL) suppository 650 mg  650 mg Rectal Q6H PRN Nolberto Hanlon, MD       aspirin EC tablet 81 mg  81 mg Oral Daily Nolberto Hanlon, MD   81 mg at 02/20/23 0825   enoxaparin (LOVENOX) injection 30 mg  30 mg Subcutaneous Q24H Nolberto Hanlon, MD   30 mg at 02/20/23 0825   ezetimibe  (ZETIA) tablet 10 mg  10 mg Oral Daily Nolberto Hanlon, MD   10 mg at 02/20/23 0825   hydrOXYzine (ATARAX) tablet 25 mg  25 mg Oral TID PRN Kathlen Mody, MD   25 mg at 02/19/23 2207   insulin aspart (novoLOG) injection 0-9 Units  0-9 Units Subcutaneous TID WC Kathlen Mody, MD   1 Units at 02/19/23 1710   polyethylene glycol (MIRALAX / GLYCOLAX) packet 17 g  17 g Oral Daily PRN Nolberto Hanlon, MD         Discharge Medications: Please see discharge summary for a list of discharge medications.  Relevant Imaging Results:  Relevant Lab Results:   Additional Information [id number]  Marliss Coots, LCSW

## 2023-02-20 NOTE — Progress Notes (Signed)
Heart Failure Navigator Progress Note  Assessed for Heart & Vascular TOC clinic readiness.  Patient does not meet criteria due to EF 50-55%, patient to discharge to SNF with Palliative and transition to Hospice as needs arise. .   Navigator will sign off at this time.   Rhae Hammock, BSN, Scientist, clinical (histocompatibility and immunogenetics) Only

## 2023-02-20 NOTE — Progress Notes (Signed)
Rounding Note    Patient Name: Sara Garza [facility] Date of Encounter: 02/20/2023  [facility] Cardiologist: Tonny Bollman, MD   Subjective   Patient slept OK last night    Breathing is fair Inpatient Medications    Scheduled Meds:  aspirin EC  81 mg Oral Daily   enoxaparin (LOVENOX) injection  30 mg Subcutaneous Q24H   ezetimibe  10 mg Oral Daily   insulin aspart  0-9 Units Subcutaneous TID WC   Continuous Infusions:   Vital Signs    Vitals:   02/19/23 2330 02/20/23 0552 02/20/23 0614 02/20/23 0731  BP: 136/64  123/75 116/70  Pulse: 98 (!) 101 (!) 112 (!) 108  Resp: 20 (!) 22 20 (!) 22  Temp: 97.9 F (36.6 C)  97.6 F (36.4 C) 97.7 F (36.5 C)  TempSrc: Oral   Oral  SpO2: 96% 93% 96% 92%  Weight:      Height:        Intake/Output Summary (Last 24 hours) at 02/20/2023 0753 Last data filed at 02/20/2023 0314 Gross per 24 hour  Intake 240 ml  Output 150 ml  Net 90 ml    Net neg 1.7     02/19/2023    4:00 AM 02/18/2023    4:14 AM 02/17/2023    5:00 AM  Last 3 Weights  Weight (lbs) 123 lb 10.9 oz 126 lb 12.2 oz 126 lb 1.7 oz  Weight (kg) 56.1 kg 57.5 kg 57.2 kg      Telemetry    SR/ST - Personally Reviewed  ECG    NO new - Personally Reviewed  Physical Exam  Exam is unchanged    GEN: Sitting in chair  ON 7L O2  Neck: JVP is increased   Cardiac: RRR,  II/VI systolic murmur LSB    Respiratory: Rales bilaterally 1/2 up  GI: Soft, nontender, non-distended  Ext   Tr LE edema   Labs    High Sensitivity Troponin:   Recent Labs  Lab 02/15/23 1729 02/15/23 1916  TROPONINIHS 46* 39*     Chemistry Recent Labs  Lab 02/15/23 1729 02/15/23 2022 02/16/23 1622 02/17/23 0312 02/18/23 1059 02/18/23 1622 02/19/23 0837  NA 141   < > 141   < > 141 139 143  K 3.6   < > 3.9   < > 3.4* 3.5 4.2  CL 104  --  108   < > 102 102 105  CO2 25  --  25   < > 28 26 29   GLUCOSE 100*  --  147*   < > 164* 105* 176*  BUN 46*  --  42*   < > 46* 54*  52*  CREATININE 1.81*  --  1.64*   < > 1.99* 2.48* 2.26*  CALCIUM 8.6*  --  8.3*   < > 8.4* 8.4* 8.8*  MG  --   --  2.3  --   --   --   --   PROT 7.2  --   --   --   --   --   --   ALBUMIN 3.9  --   --   --   --   --   --   AST 13*  --   --   --   --   --   --   ALT 9  --   --   --   --   --   --   ALKPHOS 65  --   --   --   --   --   --  BILITOT 0.7  --   --   --   --   --   --   GFRNONAA 25*  --  28*   < > 22* 17* 19*  ANIONGAP 12  --  8   < > 11 11 9    < > = values in this interval not displayed.    Lipids No results for input(s): "CHOL", "TRIG", "HDL", "LABVLDL", "LDLCALC", "CHOLHDL" in the last 168 hours.  Hematology Recent Labs  Lab 02/16/23 1622 02/17/23 0312 02/19/23 0837  WBC 6.8 6.3 6.3  RBC 3.69* 3.14* 3.54*  3.47*  HGB 11.0* 9.7* 10.4*  HCT 35.2* 30.5* 34.2*  MCV 95.4 97.1 96.6  MCH 29.8 30.9 29.4  MCHC 31.3 31.8 30.4  RDW 13.0 13.0 12.8  PLT 262 227 261   Thyroid No results for input(s): "TSH", "FREET4" in the last 168 hours.  BNP Recent Labs  Lab 02/15/23 1729  BNP 1,486.0*    DDimer No results for input(s): "DDIMER" in the last 168 hours.   Radiology    No results found.  Cardiac Studies    1. Left ventricular ejection fraction, by estimation, is 50 to 55%. The  left ventricle has low normal function. The left ventricle demonstrates  regional wall motion abnormalities- aneurysmal mid ventricular septum with  no mural thrombus and stable  apperance. There is moderate left ventricular hypertrophy. Left  ventricular diastolic parameters are indeterminate.   2. Right ventricular systolic function is normal. The right ventricular  size is normal. There is mildly elevated pulmonary artery systolic  pressure. The estimated right ventricular systolic pressure is 43.8 mmHg.   3. Left atrial size was severely dilated.   4. The mitral valve is degenerative. Mild to moderate mitral valve  regurgitation. Severe mitral stenosis. The mean mitral valve  gradient is  13.8 mmHg with average heart rate of 89 bpm. Severe mitral annular  calcification.   5. The aortic valve has been repaired/replaced. Aortic valve  regurgitation is mild. There is a 23 mm Medtronic stented ([name]) valve  present in the aortic position. Procedure Date: 2014. Aortic valve area,  by VTI measures 1.27 cm. Aortic valve mean  gradient measures 18.5 mmHg. Aortic valve Vmax measures 2.86 m/s. Aortic  valve acceleration time measures 79 msec, AV prosthesis is grossly stable,  DVI 0.37.   6. The inferior vena cava is normal in size with <50% respiratory  variability, suggesting right atrial pressure of 8 mmHg.    Patient Profile   89 yo with hx of CAD, HFpEF, CKD2, DM, HTN.   She is s/p [name] for AS  Hx MS and MR    Admitted 02/15/23 for sudden onset SOB since Thursday.     Assessment & Plan    1  MV dz  Pt with severe MS   Mean gradient through the valve 13.8 mm HG  There is probably moderate MR      The mitral stenosis has been known to be severe, seen on previous echo in Jan 2024  Unfortunately she is not a candidate for intervention for this.  May never be able to get her breathing / volume significanly improved       Still with rales on lung exam   lasix held due to rise in Cr    Todays labs are still pending    2  HFpEF  Remains volume overloaded    Lasix still on hold due to bump in Cr  Labs still pending   3  AV dz  Pt is s/p [name] in 2015 with 23 mm Medtronic Corevalve  Mean gradient is 18.5  Stable from previous echo in 04/2022      4  CAD  REmote intervention   Denies CP  Trop 46, 39      5  Disposition  Palliative care has seen pt    She should go to fulll care facility     Will review with SW    For questions or updates, please contact  [facility] Please consult [email or web address] for contact info under        Signed, Dietrich Pates, MD  02/20/2023, 7:53 AM

## 2023-02-20 NOTE — Progress Notes (Signed)
[facility]  PROGRESS NOTE  Sara Garza XBJ:[id number] DOB: 1923-07-30 DOA: 02/15/2023 PCP: Shirline Frees, NP   Brief HPI:    89 y.o. female with medical history significant of congestive heart failure, preserved ejection fraction, prior known history of coronary artery disease chronic kidney disease type 2 diabetes mellitus, hypertension as well as prior transcatheter aortic valve replacement presents with acute diastolic CHF. She was started on IV lasix , without much improvement. Lasix held today for AKI. Palliative care consulted for goals of care.     Assessment/Plan:   Acute on chronic diastolic CHF -Initially started on Lasix IV 40 mg twice daily -Lasix  held due to worsening renal function -Echocardiogram showed preserved LVEF but severe MS -Cardiology following  Mitral valve disease -Not a candidate for intervention as per cardiology -Diuresis held due to worsening renal function -Start metoprolol  and blood pressure is stable to allow for more diastolic filling  Acute hypoxemic respiratory failure -Still requiring   L/min oxygen HFNC -Completed treatment for acute bronchitis with Zithromax  Acute on CKD stage IIIb -Creatinine up to 2.26 Diuresis on hold  Hypertension -Blood pressure well-controlled  History of [name] -Stable on repeat echocardiogram  Diabetes mellitus type 2 -Insulin sliding scale with NovoLog -CBG well-controlled  Hypokalemia -Replete  Anemia of chronic disease -Baseline hemoglobin between 10 to 12 -Hemoglobin is 10.4 today.  Goals of care Palliative care consulted for goals of care Patient is DNR Plan to go to skilled nursing facility for rehab  Medications     aspirin EC  81 mg Oral Daily   enoxaparin (LOVENOX) injection  30 mg Subcutaneous Q24H   ezetimibe  10 mg Oral Daily   insulin aspart  0-9 Units Subcutaneous TID WC     Data Reviewed:   CBG:  Recent Labs  Lab 02/19/23 0609 02/19/23 1317 02/19/23 1551  02/19/23 2137 02/20/23 0613  GLUCAP 105* 145* 138* 183* 95    SpO2: 92 % O2 Flow Rate (L/min): 4.5 L/min    Vitals:   02/19/23 2330 02/20/23 0552 02/20/23 0614 02/20/23 0731  BP: 136/64  123/75 116/70  Pulse: 98 (!) 101 (!) 112 (!) 108  Resp: 20 (!) 22 20 (!) 22  Temp: 97.9 F (36.6 C)  97.6 F (36.4 C) 97.7 F (36.5 C)  TempSrc: Oral   Oral  SpO2: 96% 93% 96% 92%  Weight:      Height:          Data Reviewed:  Basic Metabolic Panel: Recent Labs  Lab 02/16/23 1622 02/17/23 0312 02/18/23 1059 02/18/23 1622 02/19/23 0837  NA 141 142 141 139 143  K 3.9 3.7 3.4* 3.5 4.2  CL 108 108 102 102 105  CO2 25 25 28 26 29   GLUCOSE 147* 106* 164* 105* 176*  BUN 42* 41* 46* 54* 52*  CREATININE 1.64* 1.55* 1.99* 2.48* 2.26*  CALCIUM 8.3* 8.1* 8.4* 8.4* 8.8*  MG 2.3  --   --   --   --   PHOS  --  4.8*  --   --   --     CBC: Recent Labs  Lab 02/15/23 1729 02/15/23 2022 02/16/23 1622 02/17/23 0312 02/19/23 0837  WBC 6.0  --  6.8 6.3 6.3  NEUTROABS 4.1  --   --   --  4.5  HGB 10.8* 13.9 11.0* 9.7* 10.4*  HCT 32.9* 41.0 35.2* 30.5* 34.2*  MCV 94.3  --  95.4 97.1 96.6  PLT 250  --  262  227 261    LFT Recent Labs  Lab 02/15/23 1729  AST 13*  ALT 9  ALKPHOS 65  BILITOT 0.7  PROT 7.2  ALBUMIN 3.9     Antibiotics: Anti-infectives (From admission, onward)    Start     Dose/Rate Route Frequency Ordered Stop   02/17/23 1700  azithromycin (ZITHROMAX) tablet 500 mg        500 mg Oral Daily 02/17/23 1031 02/18/23 1557   02/16/23 1630  azithromycin (ZITHROMAX) 500 mg in sodium chloride 0.9 % 250 mL IVPB  Status:  Discontinued        500 mg 255 mL/hr over 60 Minutes Intravenous Every 24 hours 02/16/23 1539 02/17/23 1031   02/16/23 1615  cefTRIAXone (ROCEPHIN) 1 g in sodium chloride 0.9 % 100 mL IVPB  Status:  Discontinued        1 g 200 mL/hr over 30 Minutes Intravenous Every 24 hours 02/16/23 1529 02/17/23 1030   02/16/23 1615  azithromycin (ZITHROMAX) 500 mg in  dextrose 5 % 250 mL IVPB  Status:  Discontinued        500 mg 250 mL/hr over 60 Minutes Intravenous Every 24 hours 02/16/23 1529 02/16/23 1539        DVT prophylaxis: Lovenox  Code Status: DNR  Family Communication: No family at bedside   CONSULTS    Subjective    Denies any complaints.  Goals of care discussion done with palliative care yesterday.  Objective    Physical Examination:  General-appears in no acute distress Heart-S1-S2, regular, no murmur auscultated Lungs-clear to auscultation bilaterally, no wheezing or crackles auscultated Abdomen-soft, nontender, no organomegaly Extremities-no edema in the lower extremities Neuro-alert, oriented x3, no focal deficit noted  Status is: Inpatient:             Meredeth Ide   [facility] If 7PM-7AM, please contact night-coverage at [email or web address], Office  [phone]   02/20/2023, 8:24 AM  LOS: 4 days

## 2023-02-20 NOTE — Progress Notes (Signed)
Pt resting very good and would like to have her standing weight obtained when she gets up to the chair.

## 2023-02-20 NOTE — TOC Progression Note (Signed)
Transition of Care [facility]) - Progression Note    Patient Details  Name: BRIEANNE LAWE MRN: [id number] Date of Birth: 10-08-1923  Transition of Care [facility]) CM/SW Contact  Harriet Masson, RN Phone Number: 02/20/2023, 3:25 PM  Clinical Narrative:    Spoke to patient and daughter regarding transition needs. [name], daughter, informed patient that she needs to go to rehab prior to going home. Patient is in agreement.  CSW will send FL2 to facilities and then reach out to daughter and patient.    Expected Discharge Plan: Home w Home Health Services Barriers to Discharge: Continued Medical Work up  Expected Discharge Plan and Services   Discharge Planning Services: CM Consult Post Acute Care Choice: Home Health Living arrangements for the past 2 months: Single Family Home                                       Social Determinants of Health (SDOH) Interventions SDOH Screenings   Food Insecurity: No Food Insecurity (02/16/2023)  Housing: Low Risk  (02/16/2023)  Transportation Needs: No Transportation Needs (02/16/2023)  Utilities: Not At Risk (02/16/2023)  Alcohol Screen: Low Risk  (05/22/2022)  Depression (PHQ2-9): Low Risk  (05/22/2022)  Financial Resource Strain: Low Risk  (05/22/2022)  Physical Activity: Inactive (05/22/2022)  Social Connections: Moderately Integrated (05/22/2022)  Stress: No Stress Concern Present (05/22/2022)  Tobacco Use: Medium Risk (02/15/2023)    Readmission Risk Interventions     No data to display

## 2023-02-20 NOTE — TOC Progression Note (Addendum)
Transition of Care [facility]) - Progression Note    Patient Details  Name: Sara Garza MRN: [id number] Date of Birth: 03/14/1924  [facility]) CM/SW Contact  Marliss Coots, LCSW Phone Number: 02/20/2023, 11:30 AM  Clinical Narrative:     Per bedside RN at progressions, patient would rather discharge home with Home Health/Hospice Support/DME rather than SNF. CSW will continue to be available.  2:41 PM Per RNCM Teena Irani, patient and patient's daughter are now interested in discharging to SNF. RNCM informed this CSW of SNF's mentioned by patient and patient's daughter Haynes [place], especially [facility], but does not want [facility]). RNCM also informed this CSW that [facility] notified patient and patient's daughter that CSW would initiate contact upon SNF offers. Patient's daughter requested to be called with SNF offers to discuss options with CSW and patient at bedside if patient's daughter is unable to be present. RNCM and CSW accepted this request.    Expected Discharge Plan: Home w Home Health Services Barriers to Discharge: Continued Medical Work up  Expected Discharge Plan and Services   Discharge Planning Services: CM Consult Post Acute Care Choice: Home Health Living arrangements for the past 2 months: Single Family Home                                       Social Determinants of Health (SDOH) Interventions SDOH Screenings   Food Insecurity: No Food Insecurity (02/16/2023)  Housing: Low Risk  (02/16/2023)  Transportation Needs: No Transportation Needs (02/16/2023)  Utilities: Not At Risk (02/16/2023)  Alcohol Screen: Low Risk  (05/22/2022)  Depression (PHQ2-9): Low Risk  (05/22/2022)  Financial Resource Strain: Low Risk  (05/22/2022)  Physical Activity: Inactive (05/22/2022)  Social Connections: Moderately Integrated (05/22/2022)  Stress: No Stress Concern Present (05/22/2022)  Tobacco Use: Medium Risk (02/15/2023)    Readmission  Risk Interventions     No data to display

## 2023-02-21 DIAGNOSIS — N1832 Chronic kidney disease, stage 3b: Secondary | ICD-10-CM | POA: Diagnosis not present

## 2023-02-21 DIAGNOSIS — I509 Heart failure, unspecified: Secondary | ICD-10-CM | POA: Diagnosis not present

## 2023-02-21 DIAGNOSIS — Z7189 Other specified counseling: Secondary | ICD-10-CM | POA: Diagnosis not present

## 2023-02-21 DIAGNOSIS — I10 Essential (primary) hypertension: Secondary | ICD-10-CM | POA: Diagnosis not present

## 2023-02-21 DIAGNOSIS — I5033 Acute on chronic diastolic (congestive) heart failure: Secondary | ICD-10-CM | POA: Diagnosis not present

## 2023-02-21 DIAGNOSIS — J9601 Acute respiratory failure with hypoxia: Secondary | ICD-10-CM | POA: Diagnosis not present

## 2023-02-21 DIAGNOSIS — Z515 Encounter for palliative care: Secondary | ICD-10-CM | POA: Diagnosis not present

## 2023-02-21 LAB — GLUCOSE, CAPILLARY
Glucose-Capillary: 112 mg/dL — ABNORMAL HIGH (ref 70–99)
Glucose-Capillary: 133 mg/dL — ABNORMAL HIGH (ref 70–99)
Glucose-Capillary: 97 mg/dL (ref 70–99)
Glucose-Capillary: 132 mg/dL — ABNORMAL HIGH (ref 70–99)

## 2023-02-21 LAB — BASIC METABOLIC PANEL WITH GFR
Anion gap: 13 (ref 5–15)
BUN: 61 mg/dL — ABNORMAL HIGH (ref 8–23)
CO2: 22 mmol/L (ref 22–32)
Calcium: 9 mg/dL (ref 8.9–10.3)
Chloride: 108 mmol/L (ref 98–111)
Creatinine, Ser: 1.91 mg/dL — ABNORMAL HIGH (ref 0.44–1.00)
GFR, Estimated: 24 mL/min — ABNORMAL LOW
Glucose, Bld: 111 mg/dL — ABNORMAL HIGH (ref 70–99)
Potassium: >7.5 mmol/L (ref 3.5–5.1)
Sodium: 143 mmol/L (ref 135–145)

## 2023-02-21 LAB — CULTURE, BLOOD (ROUTINE X 2)
Culture: NO GROWTH
Culture: NO GROWTH
Special Requests: ADEQUATE
Special Requests: ADEQUATE

## 2023-02-21 LAB — POTASSIUM: Potassium: 4.8 mmol/L (ref 3.5–5.1)

## 2023-02-21 MED ORDER — METOPROLOL TARTRATE 12.5 MG HALF TABLET
12.5000 mg | ORAL_TABLET | Freq: Two times a day (BID) | ORAL | Status: DC
  Administered 2023-02-21 – 2023-03-01 (×15): 12.5 mg via ORAL
  Filled 2023-02-21 (×18): qty 1

## 2023-02-21 MED ORDER — FUROSEMIDE 10 MG/ML IJ SOLN
60.0000 mg | Freq: Once | INTRAMUSCULAR | Status: AC
  Administered 2023-02-21: 60 mg via INTRAVENOUS
  Filled 2023-02-21: qty 6

## 2023-02-21 NOTE — Progress Notes (Signed)
Date and time results received: 02/21/23 19:50 (Report from previous shift) (use smartphrase ".now" to insert current time)  Test: K Critical Value: >7.5  Name of Provider Notified: Dr Loney Loh  Orders Received? Or Actions Taken?: Orders Received - See Orders for details

## 2023-02-21 NOTE — Progress Notes (Signed)
Occupational Therapy Treatment Patient Details Name: Sara Garza MRN: [id number] DOB: 1924-03-05 Today's Date: 02/21/2023   History of present illness Pt is 89 year old presented to [facility] on 02/15/23 for worsening SOB and BLE edema. Transferred to [facility] on  02/16/23. Pt with acute on chronic diastolic CHF. Pt also with severe mitral stenosis. PMH - CAD, CKD, DM, HTN, CHF, [name], Bil THR   OT comments  This 89 yo female seen today to look at LBD. She struggles with it currently due SOB/DOE/increased WOB with activity, especially when it involves having to bend forward in a seated position. Introduced her to the use of AE for LBD and had her work on return demonstrating it for socks. It does take her longer at present than to use her hands but she does not get SOB while using them. She will continue to benefit from acute OT with follow up from continued inpatient follow up therapy, <3 hours/day.       If plan is discharge home, recommend the following:  A little help with walking and/or transfers;A lot of help with bathing/dressing/bathroom;Assistance with cooking/housework;Assist for transportation   Equipment Recommendations  Other (comment) (TBD next venue)       Precautions / Restrictions Precautions Precautions: Fall Precaution Comments: watch SpO2 and HR Restrictions Weight Bearing Restrictions: No              ADL either performed or assessed with clinical judgement   ADL Overall ADL's : Needs assistance/impaired                       Lower Body Dressing Details (indicate cue type and reason): Pt has difficulty bending forward to doff and donn her socks due to increaesd WOB/SOB. Introduced Nature conservation officer and taught her how to use it to take her socks off, then the sock aid to put socks back on. She did it with min A and reported that was more trouble than just trying to do them with her hands to which I responded but you can breath while you are doing it and  the more you do it this way the easier it will be for you.                     Vision Patient Visual Report: No change from baseline            Cognition Arousal: Alert Behavior During Therapy: WFL for tasks assessed/performed Overall Cognitive Status: Within Functional Limits for tasks assessed                                 General Comments: "so you think you are going to be able to teach a 89 yo something new" (when introducing the reacher and sock aid to her for LBD)        Exercises Other Exercises Other Exercises: Instructed her in how to use the inspirometer. She was not able to get to even 250. Encouraged her to do it 10 times every waking hour or 5 times every waking 1/2 hour.            Pertinent Vitals/ Pain       Pain Assessment Pain Assessment: No/denies pain         Frequency  Min 1X/week        Progress Toward Goals  OT Goals(current goals can now be found in the  care plan section)  Progress towards OT goals: Progressing toward goals (slowly)  Acute Rehab OT Goals Patient Stated Goal: to go to rehab then home OT Goal Formulation: With patient Time For Goal Achievement: 03/04/23 Potential to Achieve Goals: Good  Plan         AM-PAC OT "6 Clicks" Daily Activity     Outcome Measure   Help from another person eating meals?: None Help from another person taking care of personal grooming?: A Little Help from another person toileting, which includes using toliet, bedpan, or urinal?: A Lot Help from another person bathing (including washing, rinsing, drying)?: A Lot Help from another person to put on and taking off regular upper body clothing?: A Lot Help from another person to put on and taking off regular lower body clothing?: Total 6 Click Score: 14    End of Session    OT Visit Diagnosis: Other abnormalities of gait and mobility (R26.89);Muscle weakness (generalized) (M62.81)   Activity Tolerance Patient tolerated  treatment well   Patient Left in chair;with call bell/phone within reach;with chair alarm set;with family/visitor present           Time: 5176-1607 OT Time Calculation (min): 33 min  Charges: OT General Charges $OT Visit: 1 Visit OT Treatments $Self Care/Home Management : 23-37 mins Lindon Romp OT Acute Rehabilitation Services Office [phone]    Evette Georges 02/21/2023, 4:37 PM

## 2023-02-21 NOTE — Progress Notes (Signed)
Lab called with critical lab result for Potassium of >7.5 with no Hemolysis. Night MD paged, awaiting response.

## 2023-02-21 NOTE — Progress Notes (Signed)
Physical Therapy Treatment Patient Details Name: Sara Garza MRN: [id number] DOB: Sep 16, 1923 Today's Date: 02/21/2023   History of Present Illness Pt is 89 year old presented to [facility] on 02/15/23 for worsening SOB and BLE edema. Transferred to [facility] on  02/16/23. Pt with acute on chronic diastolic CHF. Pt also with severe mitral stenosis. PMH - CAD, CKD, DM, HTN, CHF, [name], Bil THR    PT Comments  Pt is slowly progressing towards goals. Increased fatigue this session with elevated HR to 133 bpm during functional activities. Pt performed sit to stand 4 times during session fluctuating between Min A and CGA with 1x verbal cues for safe hand placement with good re-call. Pt requires chair follow with gait due to fatigue and respiratory status.  Due to pt current functional status, home set up and available assistance at home recommending skilled physical therapy services < 3 hours/day on discharge from acute care hospital setting in order to decrease risk for falls, injury and re-hospitalization. Pt tolerated treatment session well on 8L O2 via [place]    If plan is discharge home, recommend the following: Assistance with cooking/housework;Assist for transportation;Help with stairs or ramp for entrance;A little help with walking and/or transfers   Can travel by private vehicle     No  Equipment Recommendations  Wheelchair (measurements PT);Wheelchair cushion (measurements PT)       Precautions / Restrictions Precautions Precautions: Fall;Other (comment) Precaution Comments: watch SpO2 and HR Restrictions Weight Bearing Restrictions: No     Mobility  Bed Mobility   General bed mobility comments: Pt sitting BSC upon arrival and in recliner on departure    Transfers Overall transfer level: Needs assistance Equipment used: Rolling walker (2 wheels) Transfers: Sit to/from Stand, Bed to chair/wheelchair/BSC Sit to Stand: Min assist, Contact guard assist   Step pivot transfers: Min  assist       General transfer comment: light lift and steadying assist    Ambulation/Gait Ambulation/Gait assistance: Contact guard assist, Min assist, +2 safety/equipment Gait Distance (Feet): 20 Feet (with 2x seated rest breaks for 1-2 min) Assistive device: Rolling walker (2 wheels) Gait Pattern/deviations: Step-through pattern, Decreased step length - right, Decreased step length - left, Narrow base of support Gait velocity: decreased Gait velocity interpretation: <1.31 ft/sec, indicative of household ambulator   General Gait Details: Pt has youth walker which is still too high for her on lowest setting.  HR remained 133 bpm and below, O2 sats over 90% on 8L O2 via [place]. Pt was limited by fatigue - chair follow for safety      Balance Overall balance assessment: Needs assistance Sitting-balance support: No upper extremity supported, Feet supported Sitting balance-Leahy Scale: Good     Standing balance support: Bilateral upper extremity supported, During functional activity, Reliant on assistive device for balance Standing balance-Leahy Scale: Fair Standing balance comment: walker and CGA/Min A for static standing and ambulation        Cognition Arousal: Alert Behavior During Therapy: WFL for tasks assessed/performed Overall Cognitive Status: Within Functional Limits for tasks assessed             General Comments General comments (skin integrity, edema, etc.): Pt on 8L O2 via [place] with SPO2 above 90% during functional activities with HR 133 bpm and below throughout gait. Pt requires increased rest breaks this session during gait from last session.      Pertinent Vitals/Pain Pain Assessment Pain Assessment: No/denies pain Breathing: occasional labored breathing, short period of hyperventilation  PT Goals (current goals can now be found in the care plan section) Acute Rehab PT Goals Patient Stated Goal: return home PT Goal Formulation: With patient Time For Goal  Achievement: 03/03/23 Potential to Achieve Goals: Fair Progress towards PT goals: Progressing toward goals    Frequency    Min 1X/week      PT Plan  Continue with current POC       AM-PAC PT "6 Clicks" Mobility   Outcome Measure  Help needed turning from your back to your side while in a flat bed without using bedrails?: A Little Help needed moving from lying on your back to sitting on the side of a flat bed without using bedrails?: A Little Help needed moving to and from a bed to a chair (including a wheelchair)?: A Little Help needed standing up from a chair using your arms (e.g., wheelchair or bedside chair)?: A Little Help needed to walk in hospital room?: A Lot Help needed climbing 3-5 steps with a railing? : Total 6 Click Score: 15    End of Session Equipment Utilized During Treatment: Oxygen;Gait belt Activity Tolerance: Patient limited by fatigue;Patient tolerated treatment well Patient left: in chair;with call bell/phone within reach Nurse Communication: Mobility status PT Visit Diagnosis: Unsteadiness on feet (R26.81);Other abnormalities of gait and mobility (R26.89);Muscle weakness (generalized) (M62.81)     Time: 1610-9604 PT Time Calculation (min) (ACUTE ONLY): 34 min  Charges:    $Therapeutic Activity: 23-37 mins PT General Charges $$ ACUTE PT VISIT: 1 Visit                     Sara Garza, DPT, CLT  Acute Rehabilitation Services Office: [phone] (Secure chat preferred)    Sara Garza 02/21/2023, 10:12 AM

## 2023-02-21 NOTE — TOC Progression Note (Addendum)
Transition of Care [facility]) - Progression Note    Patient Details  Name: Sara Garza MRN: [id number] Date of Birth: 1923/11/30  Transition of Care [facility]) CM/SW Contact  Marliss Coots, LCSW Phone Number: 02/21/2023, 1:31 PM  Clinical Narrative:     This CSW introduced herself and role to patient at bedside. Patient's friend, Gigi Gin, was also present at bedside. Patient consented to this CSW speaking in front of patient. CSW informed patient of SNF bed offers. Patient requested to speak with her daughter via phone simultaneously regarding bed offers. CSW and patient's daughter, Jonet Hausler, spoke over the phone using Peggy's mobile device. [name] was on speaker. This CSW answered questions or patient, Gigi Gin, and [name] regarding SNF. Patient, [name], and Gigi Gin decided to continue SNF conversation and make decision once [name] is able to be present at patient's bedside (after work around 6:00PM today). CSW relayed that they will not be available over the weekend but a social worker will if they felt the need to ask other questions or inform of social work SNF decision. Patient, [name], and Gigi Gin expressed understanding of this information.  3:15 PM CSW provided patient with [facility] ratings upon bed offer. Patient's friend was present at bedside. Patient provided this CSW verbal consent to speak in front of friend. CSW followed up on [facility] interest (per OT). Patient consent this CSW to submit a referral to [facility] in hopes for a bed offer.   Expected Discharge Plan: Skilled Nursing Facility Barriers to Discharge: Continued Medical Work up, SNF Pending discharge orders, SNF Pending transportation, SNF Pending discharge summary, English as a second language teacher  Expected Discharge Plan and Services [place] Referral: Clinical Social Work Discharge Planning Services: CM Consult Post Acute Care Choice: Skilled Nursing Facility Living arrangements for the past 2 months: Single  Family Home                                       Social Determinants of Health (SDOH) Interventions SDOH Screenings   Food Insecurity: No Food Insecurity (02/16/2023)  Housing: Low Risk  (02/16/2023)  Transportation Needs: No Transportation Needs (02/16/2023)  Utilities: Not At Risk (02/16/2023)  Alcohol Screen: Low Risk  (05/22/2022)  Depression (PHQ2-9): Low Risk  (05/22/2022)  Financial Resource Strain: Low Risk  (05/22/2022)  Physical Activity: Inactive (05/22/2022)  Social Connections: Moderately Integrated (05/22/2022)  Stress: No Stress Concern Present (05/22/2022)  Tobacco Use: Medium Risk (02/15/2023)    Readmission Risk Interventions     No data to display

## 2023-02-21 NOTE — Progress Notes (Incomplete Revision)
Rounding Note    Patient Name: Sara Garza [facility] Date of Encounter: 02/21/2023  [facility] Cardiologist: Tonny Bollman, MD   Subjective   Breathing is fair   Inpatient Medications    Scheduled Meds:  aspirin EC  81 mg Oral Daily   enoxaparin (LOVENOX) injection  30 mg Subcutaneous Q24H   ezetimibe  10 mg Oral Daily   insulin aspart  0-9 Units Subcutaneous TID WC   Continuous Infusions:   Vital Signs    Vitals:   02/21/23 0251 02/21/23 0619 02/21/23 0722 02/21/23 1058  BP:   116/64 131/78  Pulse: (!) 104  92 (!) 103  Resp: 20  (!) 21 (!) 35  Temp:   98.1 F (36.7 C) 97.7 F (36.5 C)  TempSrc:   Oral Oral  SpO2: 91%  90% 91%  Weight:  56.9 kg    Height:        Intake/Output Summary (Last 24 hours) at 02/21/2023 1431 Last data filed at 02/21/2023 0900 Gross per 24 hour  Intake 480 ml  Output 750 ml  Net -270 ml    Net neg 2 L      02/21/2023    6:19 AM 02/20/2023    7:08 AM 02/19/2023    4:00 AM  Last 3 Weights  Weight (lbs) 125 lb 7.1 oz 124 lb 12.5 oz 123 lb 10.9 oz  Weight (kg) 56.9 kg 56.6 kg 56.1 kg      Telemetry   SR/ST   Possible atrial tach    - Personally Reviewed  ECG    NO new - Personally Reviewed  Physical Exam  Exam is unchanged    GEN: Sitting in chair  Back on  7L O2 [place]: JVP is increased   Cardiac: RRR,  II/VI systolic murmur LSB    Respiratory: Rales bilaterally 1/2 up  GI: Soft, nontender, non-distended  Ext   Tr LE edema   Labs    High Sensitivity Troponin:   Recent Labs  Lab 02/15/23 1729 02/15/23 1916  TROPONINIHS 46* 39*     Chemistry Recent Labs  Lab 02/15/23 1729 02/15/23 2022 02/16/23 1622 02/17/23 0312 02/18/23 1622 02/19/23 0837 02/20/23 1057  NA 141   < > 141   < > 139 143 140  K 3.6   < > 3.9   < > 3.5 4.2 4.7  CL 104  --  108   < > 102 105 105  CO2 25  --  25   < > 26 29 25   GLUCOSE 100*  --  147*   < > 105* 176* 85  BUN 46*  --  42*   < > 54* 52* 57*  CREATININE 1.81*   --  1.64*   < > 2.48* 2.26* 2.30*  CALCIUM 8.6*  --  8.3*   < > 8.4* 8.8* 8.6*  MG  --   --  2.3  --   --   --   --   PROT 7.2  --   --   --   --   --   --   ALBUMIN 3.9  --   --   --   --   --   --   AST 13*  --   --   --   --   --   --   ALT 9  --   --   --   --   --   --   Jolly Mango  65  --   --   --   --   --   --   BILITOT 0.7  --   --   --   --   --   --   GFRNONAA 25*  --  28*   < > 17* 19* 19*  ANIONGAP 12  --  8   < > 11 9 10    < > = values in this interval not displayed.    Lipids No results for input(s): "CHOL", "TRIG", "HDL", "LABVLDL", "LDLCALC", "CHOLHDL" in the last 168 hours.  Hematology Recent Labs  Lab 02/16/23 1622 02/17/23 0312 02/19/23 0837  WBC 6.8 6.3 6.3  RBC 3.69* 3.14* 3.54*  3.47*  HGB 11.0* 9.7* 10.4*  HCT 35.2* 30.5* 34.2*  MCV 95.4 97.1 96.6  MCH 29.8 30.9 29.4  MCHC 31.3 31.8 30.4  RDW 13.0 13.0 12.8  PLT 262 227 261   Thyroid No results for input(s): "TSH", "FREET4" in the last 168 hours.  BNP Recent Labs  Lab 02/15/23 1729 02/20/23 1057  BNP 1,486.0* 1,358.0*    DDimer No results for input(s): "DDIMER" in the last 168 hours.   Radiology    No results found.  Cardiac Studies    1. Left ventricular ejection fraction, by estimation, is 50 to 55%. The  left ventricle has low normal function. The left ventricle demonstrates  regional wall motion abnormalities- aneurysmal mid ventricular septum with  no mural thrombus and stable  apperance. There is moderate left ventricular hypertrophy. Left  ventricular diastolic parameters are indeterminate.   2. Right ventricular systolic function is normal. The right ventricular  size is normal. There is mildly elevated pulmonary artery systolic  pressure. The estimated right ventricular systolic pressure is 43.8 mmHg.   3. Left atrial size was severely dilated.   4. The mitral valve is degenerative. Mild to moderate mitral valve  regurgitation. Severe mitral stenosis. The mean mitral valve  gradient is  13.8 mmHg with average heart rate of 89 bpm. Severe mitral annular  calcification.   5. The aortic valve has been repaired/replaced. Aortic valve  regurgitation is mild. There is a 23 mm Medtronic stented ([name]) valve  present in the aortic position. Procedure Date: 2014. Aortic valve area,  by VTI measures 1.27 cm. Aortic valve mean  gradient measures 18.5 mmHg. Aortic valve Vmax measures 2.86 m/s. Aortic  valve acceleration time measures 79 msec, AV prosthesis is grossly stable,  DVI 0.37.   6. The inferior vena cava is normal in size with <50% respiratory  variability, suggesting right atrial pressure of 8 mmHg.    Patient Profile   89 yo with hx of CAD, HFpEF, CKD2, DM, HTN.   She is s/p [name] for AS  Hx MS and MR    Admitted 02/15/23 for sudden onset SOB since Thursday.     Assessment & Plan    1  MV dz  Pt with severe MS   Mean gradient through the valve 13.8 mm HG  There is probably moderate MR      The mitral stenosis has been known to be severe, seen on previous echo in Jan 2024  Unfortunately she is not a candidate for intervention for this.  May never be able to get her breathing / volume significanly improved          2  HFpEF  Remains volume overloaded  Lasix was held due to bump in Cr   It has come down but still  above baseline   2.2 now   Baseline 1.6    O2 needs have gone back up I would give one dose of IV lasix 60 and follow response From here I would transiton to 20 or 40 of torsemide based on renal response I would also recomm metoprolol 12.5 mg bid given higher heart rates  OVerall this will continue, I have spoken to patient and daughter about this      3  Rhythm  Pt with SR/ST   Tele diffiuclt  AV dz  Pt is s/p [name] in 2015 with 23 mm Medtronic Corevalve  Mean gradient is 18.5  which is stable from previous echo in 04/2022    4  CAD  REmote intervention   Denies CP  Trop 46, 39      5  Disposition  Palliative care has seen pt   I have spoken to NP     She should go to fulll care facility   PT has facilities to review   For questions or updates, please contact [facility] Please consult [email or web address] for contact info under        Signed, Dietrich Pates, MD  02/21/2023, 2:31 PM

## 2023-02-21 NOTE — Progress Notes (Signed)
[facility]  PROGRESS NOTE  Sara Garza UJW:[id number] DOB: 11/27/1923 DOA: 02/15/2023 PCP: Shirline Frees, NP   Brief HPI:    89 y.o. female with medical history significant of congestive heart failure, preserved ejection fraction, prior known history of coronary artery disease chronic kidney disease type 2 diabetes mellitus, hypertension as well as prior transcatheter aortic valve replacement presents with acute diastolic CHF. She was started on IV lasix , without much improvement. Lasix held today for AKI. Palliative care consulted for goals of care.     Assessment/Plan:   Acute on chronic diastolic CHF -Initially started on Lasix IV 40 mg twice daily -Lasix  held due to worsening renal function -Echocardiogram showed preserved LVEF but severe MS -BNP elevated at 1358 -Cardiology following  Mitral valve disease -Not a candidate for intervention as per cardiology -Diuresis held due to worsening renal function -Start metoprolol  and blood pressure is stable to allow for more diastolic filling  Acute hypoxemic respiratory failure -Still requiring  8 L/min oxygen HFNC -Completed treatment for acute bronchitis with Zithromax  Acute on CKD stage IIIb -Creatinine up to 2.30 Diuresis on hold due to worsening renal function  Hypertension -Blood pressure well-controlled  History of [name] -Stable on repeat echocardiogram  Diabetes mellitus type 2 -Insulin sliding scale with NovoLog -CBG well-controlled  Hypokalemia -Replete  Anemia of chronic disease -Baseline hemoglobin between 10 to 12 -Hemoglobin is 10.4 today.  Goals of care Palliative care consulted for goals of care Patient is DNR Plan to go to skilled nursing facility for rehab  Medications     aspirin EC  81 mg Oral Daily   enoxaparin (LOVENOX) injection  30 mg Subcutaneous Q24H   ezetimibe  10 mg Oral Daily   insulin aspart  0-9 Units Subcutaneous TID WC     Data Reviewed:   CBG:  Recent  Labs  Lab 02/20/23 0613 02/20/23 1110 02/20/23 1631 02/20/23 2117 02/21/23 0615  GLUCAP 95 97 145* 129* 112*    SpO2: 90 % O2 Flow Rate (L/min): 6 L/min    Vitals:   02/21/23 0234 02/21/23 0251 02/21/23 0619 02/21/23 0722  BP: 129/72   116/64  Pulse: 100 (!) 104  92  Resp: (!) 32 20  (!) 21  Temp: 97.6 F (36.4 C)   98.1 F (36.7 C)  TempSrc: Oral   Oral  SpO2: 90% 91%  90%  Weight:   56.9 kg   Height:          Data Reviewed:  Basic Metabolic Panel: Recent Labs  Lab 02/16/23 1622 02/17/23 0312 02/18/23 1059 02/18/23 1622 02/19/23 0837 02/20/23 1057  NA 141 142 141 139 143 140  K 3.9 3.7 3.4* 3.5 4.2 4.7  CL 108 108 102 102 105 105  CO2 25 25 28 26 29 25   GLUCOSE 147* 106* 164* 105* 176* 85  BUN 42* 41* 46* 54* 52* 57*  CREATININE 1.64* 1.55* 1.99* 2.48* 2.26* 2.30*  CALCIUM 8.3* 8.1* 8.4* 8.4* 8.8* 8.6*  MG 2.3  --   --   --   --   --   PHOS  --  4.8*  --   --   --   --     CBC: Recent Labs  Lab 02/15/23 1729 02/15/23 2022 02/16/23 1622 02/17/23 0312 02/19/23 0837  WBC 6.0  --  6.8 6.3 6.3  NEUTROABS 4.1  --   --   --  4.5  HGB 10.8* 13.9 11.0* 9.7* 10.4*  HCT  32.9* 41.0 35.2* 30.5* 34.2*  MCV 94.3  --  95.4 97.1 96.6  PLT 250  --  262 227 261    LFT Recent Labs  Lab 02/15/23 1729  AST 13*  ALT 9  ALKPHOS 65  BILITOT 0.7  PROT 7.2  ALBUMIN 3.9     Antibiotics: Anti-infectives (From admission, onward)    Start     Dose/Rate Route Frequency Ordered Stop   02/17/23 1700  azithromycin (ZITHROMAX) tablet 500 mg        500 mg Oral Daily 02/17/23 1031 02/18/23 1557   02/16/23 1630  azithromycin (ZITHROMAX) 500 mg in sodium chloride 0.9 % 250 mL IVPB  Status:  Discontinued        500 mg 255 mL/hr over 60 Minutes Intravenous Every 24 hours 02/16/23 1539 02/17/23 1031   02/16/23 1615  cefTRIAXone (ROCEPHIN) 1 g in sodium chloride 0.9 % 100 mL IVPB  Status:  Discontinued        1 g 200 mL/hr over 30 Minutes Intravenous Every 24 hours  02/16/23 1529 02/17/23 1030   02/16/23 1615  azithromycin (ZITHROMAX) 500 mg in dextrose 5 % 250 mL IVPB  Status:  Discontinued        500 mg 250 mL/hr over 60 Minutes Intravenous Every 24 hours 02/16/23 1529 02/16/23 1539        DVT prophylaxis: Lovenox  Code Status: DNR  Family Communication: No family at bedside   CONSULTS    Subjective   Has mild shortness of breath   Objective    Physical Examination:  General-appears in no acute distress Heart-S1-S2, regular, no murmur auscultated Lungs-bibasilar crackles Abdomen-soft, nontender, no organomegaly Extremities-no edema in the lower extremities Neuro-alert, oriented x3, no focal deficit noted Status is: Inpatient:             Meredeth Ide   [facility] If 7PM-7AM, please contact night-coverage at [email or web address], Office  [phone]   02/21/2023, 8:00 AM  LOS: 5 days

## 2023-02-21 NOTE — Progress Notes (Signed)
Rounding Note    Patient Name: Sara Garza [facility] Date of Encounter: 02/21/2023  [facility] Cardiologist: Tonny Bollman, MD   Subjective   Breathing is fair   Inpatient Medications    Scheduled Meds:  aspirin EC  81 mg Oral Daily   enoxaparin (LOVENOX) injection  30 mg Subcutaneous Q24H   ezetimibe  10 mg Oral Daily   insulin aspart  0-9 Units Subcutaneous TID WC   Continuous Infusions:   Vital Signs    Vitals:   02/21/23 0251 02/21/23 0619 02/21/23 0722 02/21/23 1058  BP:   116/64 131/78  Pulse: (!) 104  92 (!) 103  Resp: 20  (!) 21 (!) 35  Temp:   98.1 F (36.7 C) 97.7 F (36.5 C)  TempSrc:   Oral Oral  SpO2: 91%  90% 91%  Weight:  56.9 kg    Height:        Intake/Output Summary (Last 24 hours) at 02/21/2023 1431 Last data filed at 02/21/2023 0900 Gross per 24 hour  Intake 480 ml  Output 750 ml  Net -270 ml    Net neg 2 L      02/21/2023    6:19 AM 02/20/2023    7:08 AM 02/19/2023    4:00 AM  Last 3 Weights  Weight (lbs) 125 lb 7.1 oz 124 lb 12.5 oz 123 lb 10.9 oz  Weight (kg) 56.9 kg 56.6 kg 56.1 kg      Telemetry   SR/ST   Possible atrial tach    - Personally Reviewed  ECG    NO new - Personally Reviewed  Physical Exam  Exam is unchanged    GEN: Sitting in chair  Back on  7L O2 [place]ck: JVP is increased   Cardiac: RRR,  II/VI systolic murmur LSB    Respiratory: Rales bilaterally 1/2 up  GI: Soft, nontender, non-distended  Ext   Tr LE edema   Labs    High Sensitivity Troponin:   Recent Labs  Lab 02/15/23 1729 02/15/23 1916  TROPONINIHS 46* 39*     Chemistry Recent Labs  Lab 02/15/23 1729 02/15/23 2022 02/16/23 1622 02/17/23 0312 02/18/23 1622 02/19/23 0837 02/20/23 1057  NA 141   < > 141   < > 139 143 140  K 3.6   < > 3.9   < > 3.5 4.2 4.7  CL 104  --  108   < > 102 105 105  CO2 25  --  25   < > 26 29 25   GLUCOSE 100*  --  147*   < > 105* 176* 85  BUN 46*  --  42*   < > 54* 52* 57*  CREATININE 1.81*   --  1.64*   < > 2.48* 2.26* 2.30*  CALCIUM 8.6*  --  8.3*   < > 8.4* 8.8* 8.6*  MG  --   --  2.3  --   --   --   --   PROT 7.2  --   --   --   --   --   --   ALBUMIN 3.9  --   --   --   --   --   --   AST 13*  --   --   --   --   --   --   ALT 9  --   --   --   --   --   --   Jolly Mango  65  --   --   --   --   --   --   BILITOT 0.7  --   --   --   --   --   --   GFRNONAA 25*  --  28*   < > 17* 19* 19*  ANIONGAP 12  --  8   < > 11 9 10    < > = values in this interval not displayed.    Lipids No results for input(s): "CHOL", "TRIG", "HDL", "LABVLDL", "LDLCALC", "CHOLHDL" in the last 168 hours.  Hematology Recent Labs  Lab 02/16/23 1622 02/17/23 0312 02/19/23 0837  WBC 6.8 6.3 6.3  RBC 3.69* 3.14* 3.54*  3.47*  HGB 11.0* 9.7* 10.4*  HCT 35.2* 30.5* 34.2*  MCV 95.4 97.1 96.6  MCH 29.8 30.9 29.4  MCHC 31.3 31.8 30.4  RDW 13.0 13.0 12.8  PLT 262 227 261   Thyroid No results for input(s): "TSH", "FREET4" in the last 168 hours.  BNP Recent Labs  Lab 02/15/23 1729 02/20/23 1057  BNP 1,486.0* 1,358.0*    DDimer No results for input(s): "DDIMER" in the last 168 hours.   Radiology    No results found.  Cardiac Studies    1. Left ventricular ejection fraction, by estimation, is 50 to 55%. The  left ventricle has low normal function. The left ventricle demonstrates  regional wall motion abnormalities- aneurysmal mid ventricular septum with  no mural thrombus and stable  apperance. There is moderate left ventricular hypertrophy. Left  ventricular diastolic parameters are indeterminate.   2. Right ventricular systolic function is normal. The right ventricular  size is normal. There is mildly elevated pulmonary artery systolic  pressure. The estimated right ventricular systolic pressure is 43.8 mmHg.   3. Left atrial size was severely dilated.   4. The mitral valve is degenerative. Mild to moderate mitral valve  regurgitation. Severe mitral stenosis. The mean mitral valve  gradient is  13.8 mmHg with average heart rate of 89 bpm. Severe mitral annular  calcification.   5. The aortic valve has been repaired/replaced. Aortic valve  regurgitation is mild. There is a 23 mm Medtronic stented ([name]) valve  present in the aortic position. Procedure Date: 2014. Aortic valve area,  by VTI measures 1.27 cm. Aortic valve mean  gradient measures 18.5 mmHg. Aortic valve Vmax measures 2.86 m/s. Aortic  valve acceleration time measures 79 msec, AV prosthesis is grossly stable,  DVI 0.37.   6. The inferior vena cava is normal in size with <50% respiratory  variability, suggesting right atrial pressure of 8 mmHg.    Patient Profile   89 yo with hx of CAD, HFpEF, CKD2, DM, HTN.   She is s/p [name] for AS  Hx MS and MR    Admitted 02/15/23 for sudden onset SOB since Thursday.     Assessment & Plan    1  MV dz  Pt with severe MS   Mean gradient through the valve 13.8 mm HG  There is probably moderate MR      The mitral stenosis has been known to be severe, seen on previous echo in Jan 2024  Unfortunately she is not a candidate for intervention for this.  May never be able to get her breathing / volume significanly improved          2  HFpEF  Remains volume overloaded  Lasix was held due to bump in Cr   It has come down but still  above baseline   2.2 now   Baseline 1.6    O2 needs have gone back up I would give one dose of IV lasix 60 and follow response From here I would transiton to 20 or 40 of torsemide based on renal response OVerall this will continue, I have spoken to patient and daughter about this      3  AV dz  Pt is s/p [name] in 2015 with 23 mm Medtronic Corevalve  Mean gradient is 18.5  which is stable from previous echo in 04/2022    4  CAD  REmote intervention   Denies CP  Trop 46, 39      5  Disposition  Palliative care has seen pt    She should go to fulll care facility   PT has facilities to review   For questions or updates, please contact [facility] Please consult [email or web address] for contact info under        Signed, Dietrich Pates, MD  02/21/2023, 2:31 PM

## 2023-02-21 NOTE — Progress Notes (Signed)
                                                                                                                                                                                                          Palliative Medicine Progress Note   Patient Name: Sara Garza [facility]       Date: 02/21/2023 DOB: October 30, 1923  Age: 89 y.o. MRN#: [id number] Attending Physician: Meredeth Ide, MD Primary Care Physician: Shirline Frees, NP Admit Date: 02/15/2023  Reason for Consultation/Follow-up: {Reason for Consult:23484}  HPI/Patient Profile: 89 y.o. female with past medical history of HFpEF, CAD, severe to moderate mitral valve stenosis, HTN, s/p [name], CKD stage 2, diabetes admitted on 02/15/2023 with shortness of breath related to severe mitral valve stenosis. Diuresis recommended but limited by renal function.   Subjective: Chart reviewed including progress notes, labs, and imaging.     Objective:  Physical Exam          Vital Signs: BP 131/78 (BP Location: Right Arm)   Pulse (!) 103   Temp 97.7 F (36.5 C) (Oral)   Resp (!) 35   Ht 4\' 11"  (1.499 m)   Wt 56.9 kg   SpO2 91%   BMI 25.34 kg/m  SpO2: SpO2: 91 % O2 Device: O2 Device: High Flow Nasal Cannula O2 Flow Rate: O2 Flow Rate (L/min): 8 L/min  Intake/output summary:  Intake/Output Summary (Last 24 hours) at 02/21/2023 1526 Last data filed at 02/21/2023 0900 Gross per 24 hour  Intake 480 ml  Output 750 ml  Net -270 ml    LBM: Last BM Date : 02/15/23     Palliative Assessment/Data: ***     Palliative Medicine Assessment & Plan   Assessment: Principal Problem:   Acute on chronic diastolic CHF (congestive heart failure) (HCC) Active Problems:   Essential hypertension   CKD (chronic kidney disease), stage III (HCC)    Recommendations/Plan: ***  Goals of Care and Additional Recommendations: Limitations on Scope of Treatment: {Recommended Scope and Preferences:21019}  Code Status:   Prognosis:  {Palliative Care  Prognosis:23504}  Discharge Planning: {Palliative dispostion:23505}  Care plan was discussed with ***  Thank you for allowing the Palliative Medicine Team to assist in the care of this patient.   ***   Merry Proud, NP   Please contact Palliative Medicine Team phone at [phone] for questions and concerns.  For individual providers, please see AMION.

## 2023-02-22 DIAGNOSIS — Z515 Encounter for palliative care: Secondary | ICD-10-CM | POA: Diagnosis not present

## 2023-02-22 DIAGNOSIS — I10 Essential (primary) hypertension: Secondary | ICD-10-CM | POA: Diagnosis not present

## 2023-02-22 DIAGNOSIS — I5033 Acute on chronic diastolic (congestive) heart failure: Secondary | ICD-10-CM | POA: Diagnosis not present

## 2023-02-22 DIAGNOSIS — Z7189 Other specified counseling: Secondary | ICD-10-CM | POA: Diagnosis not present

## 2023-02-22 DIAGNOSIS — I509 Heart failure, unspecified: Secondary | ICD-10-CM | POA: Diagnosis not present

## 2023-02-22 DIAGNOSIS — N1832 Chronic kidney disease, stage 3b: Secondary | ICD-10-CM | POA: Diagnosis not present

## 2023-02-22 LAB — GLUCOSE, CAPILLARY
Glucose-Capillary: 114 mg/dL — ABNORMAL HIGH (ref 70–99)
Glucose-Capillary: 97 mg/dL (ref 70–99)
Glucose-Capillary: 81 mg/dL (ref 70–99)
Glucose-Capillary: 116 mg/dL — ABNORMAL HIGH (ref 70–99)

## 2023-02-22 LAB — BASIC METABOLIC PANEL
Anion gap: 10 (ref 5–15)
BUN: 56 mg/dL — ABNORMAL HIGH (ref 8–23)
CO2: 28 mmol/L (ref 22–32)
Calcium: 8.8 mg/dL — ABNORMAL LOW (ref 8.9–10.3)
Chloride: 104 mmol/L (ref 98–111)
Creatinine, Ser: 1.84 mg/dL — ABNORMAL HIGH (ref 0.44–1.00)
GFR, Estimated: 25 mL/min — ABNORMAL LOW (ref >60)
Glucose, Bld: 111 mg/dL — ABNORMAL HIGH (ref 70–99)
Potassium: 4.7 mmol/L (ref 3.5–5.1)
Sodium: 142 mmol/L (ref 135–145)

## 2023-02-22 MED ORDER — TORSEMIDE 20 MG PO TABS
20.0000 mg | ORAL_TABLET | Freq: Every day | ORAL | Status: DC
  Administered 2023-02-22 – 2023-02-23 (×2): 20 mg via ORAL
  Filled 2023-02-22 (×2): qty 1

## 2023-02-22 NOTE — Progress Notes (Signed)
Mobility Specialist Progress Note:   02/22/23 0900  Mobility  Activity Transferred to/from [facility];Transferred from bed to chair  Level of Assistance Minimal assist, patient does 75% or more  Assistive Device Front wheel walker  Distance Ambulated (ft) 8 ft  Activity Response Tolerated well  Mobility Referral Yes  $Mobility charge 1 Mobility  Mobility Specialist Start Time (ACUTE ONLY) [id number]  Mobility Specialist Stop Time (ACUTE ONLY) 0919  Mobility Specialist Time Calculation (min) (ACUTE ONLY) 12 min    Pt received in bed, requesting assistance to [facility]. MinA for bed mobility and to stand and pivot to [facility]. BM successful. NT assisted w/ pericare. Asymptomatic w/ no complaints throughout. Pt left in chair w/ call bell near and NT present.  [place]'Vante Earlene Plater Mobility Specialist Please contact via Special educational needs teacher or Rehab office at [phone]

## 2023-02-22 NOTE — Progress Notes (Signed)
Progress Note  Patient Name: Sara Garza Date of Encounter: 02/22/2023  Primary Cardiologist: Tonny Bollman, MD   Subjective  Patient seen and examined at her bedside. She was awake and offers no complaints.   Inpatient Medications    Scheduled Meds:  aspirin EC  81 mg Oral Daily   enoxaparin (LOVENOX) injection  30 mg Subcutaneous Q24H   ezetimibe  10 mg Oral Daily   insulin aspart  0-9 Units Subcutaneous TID WC   metoprolol tartrate  12.5 mg Oral BID   Continuous Infusions:  PRN Meds: acetaminophen **OR** acetaminophen, hydrOXYzine, polyethylene glycol   Vital Signs    Vitals:   02/21/23 2328 02/22/23 0413 02/22/23 0750 02/22/23 0857  BP: 117/65 125/60 90/69 90/69   Pulse: (!) 105 91 95 95  Resp: (!) 34 (!) 24 (!) 24   Temp: 97.7 F (36.5 C) 97.8 F (36.6 C) 97.6 F (36.4 C)   TempSrc: Oral Oral Oral   SpO2: (!) 89% (!) 89% 91%   Weight:  55.7 kg    Height:        Intake/Output Summary (Last 24 hours) at 02/22/2023 1004 Last data filed at 02/22/2023 0900 Gross per 24 hour  Intake 120 ml  Output 500 ml  Net -380 ml   Filed Weights   02/20/23 0708 02/21/23 0619 02/22/23 0413  Weight: 56.6 kg 56.9 kg 55.7 kg    Telemetry     - Personally Reviewed  ECG    None today  - Personally Reviewed  Physical Exam   General: Comfortable Head: Atraumatic, normal size  Eyes: PEERLA, EOMI  Neck: Supple, normal JVD Cardiac: Normal S1, S2; RRR; no murmurs, rubs, or gallops Lungs: Clear to auscultation bilaterally Abd: Soft, nontender, no hepatomegaly  Ext: warm, no edema Musculoskeletal: No deformities, BUE and BLE strength normal and equal Skin: Warm and dry, no rashes   Neuro: Alert and oriented to person, place, time, and situation, CNII-XII grossly intact, no focal deficits  Psych: Normal mood and affect   Labs    Chemistry Recent Labs  Lab 02/15/23 1729 02/15/23 2022 02/20/23 1057 02/21/23 1513 02/21/23 2012 02/22/23 0206  NA 141   < >  140 143  --  142  K 3.6   < > 4.7 >7.5* 4.8 4.7  CL 104   < > 105 108  --  104  CO2 25   < > 25 22  --  28  GLUCOSE 100*   < > 85 111*  --  111*  BUN 46*   < > 57* 61*  --  56*  CREATININE 1.81*   < > 2.30* 1.91*  --  1.84*  CALCIUM 8.6*   < > 8.6* 9.0  --  8.8*  PROT 7.2  --   --   --   --   --   ALBUMIN 3.9  --   --   --   --   --   AST 13*  --   --   --   --   --   ALT 9  --   --   --   --   --   ALKPHOS 65  --   --   --   --   --   BILITOT 0.7  --   --   --   --   --   GFRNONAA 25*   < > 19* 24*  --  25*  ANIONGAP 12   < >  10 13  --  10   < > = values in this interval not displayed.     Hematology Recent Labs  Lab 02/16/23 1622 02/17/23 0312 02/19/23 0837  WBC 6.8 6.3 6.3  RBC 3.69* 3.14* 3.54*  3.47*  HGB 11.0* 9.7* 10.4*  HCT 35.2* 30.5* 34.2*  MCV 95.4 97.1 96.6  MCH 29.8 30.9 29.4  MCHC 31.3 31.8 30.4  RDW 13.0 13.0 12.8  PLT 262 227 261    Cardiac EnzymesNo results for input(s): "TROPONINI" in the last 168 hours. No results for input(s): "TROPIPOC" in the last 168 hours.   BNP Recent Labs  Lab 02/15/23 1729 02/20/23 1057  BNP 1,486.0* 1,358.0*     DDimer No results for input(s): "DDIMER" in the last 168 hours.   Radiology    No results found.  Cardiac Studies  Echo reviewed   Patient Profile     89 y.o. female with hx of CAD, HFpEF, CKD2, DM, HTN. She is s/p [name] for AS Hx MS and MR Admitted 02/15/23 for sudden onset SOB   Assessment & Plan    Valvular heart disease ( severe mitral stenosis, mild to moderate MR, S/P [name]) - unfortunately with her overall clinical picture she is not a candidate for further valvular intervention. Goal from now on should ideally be symptom relief - agree with palliative care on board.   HFpEF - has been on cautious IV diuretic. Blood pressure on the lower side today. But I did take her blood pressure while in her room and it was 128/63 mmHG, if her blood pressure continue to be normal - ok to start oral diuretic -  torsemide 20 mg daily. If this does not improve she will benefit from the use of midodrine - to support blood pressure in the setting of restarting oral diuretics.   CAD - stable.    For questions or updates, please contact [facility] Please consult [email or web address] for contact info under Cardiology/STEMI.      Signed, Dorothy Landgrebe, DO  02/22/2023, 10:04 AM

## 2023-02-22 NOTE — Plan of Care (Signed)
Problem: Nutrition: Goal: Adequate nutrition will be maintained Outcome: Progressing   Problem: Coping: Goal: Level of anxiety will decrease Outcome: Progressing   Problem: Elimination: Goal: Will not experience complications related to bowel motility Outcome: Progressing   Problem: Safety: Goal: Ability to remain free from injury will improve Outcome: Progressing   Problem: Skin Integrity: Goal: Risk for impaired skin integrity will decrease Outcome: Progressing

## 2023-02-22 NOTE — Progress Notes (Signed)
[facility]  PROGRESS NOTE  Sara Garza NWG:[id number] DOB: 1923/09/04 DOA: 02/15/2023 PCP: Shirline Frees, NP   Brief HPI:    89 y.o. female with medical history significant of congestive heart failure, preserved ejection fraction, prior known history of coronary artery disease chronic kidney disease type 2 diabetes mellitus, hypertension as well as prior transcatheter aortic valve replacement presents with acute diastolic CHF. She was started on IV lasix , without much improvement. Lasix held today for AKI. Palliative care consulted for goals of care.     Assessment/Plan:   Acute on chronic diastolic CHF -Initially started on Lasix IV 40 mg twice daily -Lasix  held due to worsening renal function -Echocardiogram showed preserved LVEF but severe MS -BNP elevated at 1358 -Cardiology following -1 dose of Lasix 60 mg IV given yesterday with good diuresis -Cardiology recommend to start torsemide 20 mg daily if blood pressure remains stable, if BP is low we will also add midodrine along with Lasix  Mitral valve disease -Not a candidate for intervention as per cardiology -Diuresis held due to worsening renal function -Start metoprolol  and blood pressure is stable to allow for more diastolic filling  Acute hypoxemic respiratory failure -Still requiring  8 L/min oxygen HFNC -Completed treatment for acute bronchitis with Zithromax  Acute on CKD stage IIIb -Creatinine improved to 1.84   Hypertension -Blood pressure well-controlled  History of [name] -Stable on repeat echocardiogram  Diabetes mellitus type 2 -Insulin sliding scale with NovoLog -CBG well-controlled  Hypokalemia -Replete  Anemia of chronic disease -Baseline hemoglobin between 10 to 12 -Hemoglobin is 10.4 today.  Goals of care Palliative care consulted for goals of care Patient is DNR Plan to go to skilled nursing facility for rehab  Medications     aspirin EC  81 mg Oral Daily   enoxaparin  (LOVENOX) injection  30 mg Subcutaneous Q24H   ezetimibe  10 mg Oral Daily   insulin aspart  0-9 Units Subcutaneous TID WC   metoprolol tartrate  12.5 mg Oral BID     Data Reviewed:   CBG:  Recent Labs  Lab 02/21/23 0615 02/21/23 1054 02/21/23 1544 02/21/23 2104 02/22/23 0620  GLUCAP 112* 133* 97 132* 114*    SpO2: 91 % O2 Flow Rate (L/min): 8 L/min    Vitals:   02/21/23 2000 02/21/23 2328 02/22/23 0413 02/22/23 0750  BP:  117/65 125/60 90/69  Pulse: 100 (!) 105 91 95  Resp:  (!) 34 (!) 24 (!) 24  Temp:  97.7 F (36.5 C) 97.8 F (36.6 C) 97.6 F (36.4 C)  TempSrc:  Oral Oral Oral  SpO2: 94% (!) 89% (!) 89% 91%  Weight:   55.7 kg   Height:          Data Reviewed:  Basic Metabolic Panel: Recent Labs  Lab 02/16/23 1622 02/17/23 0312 02/18/23 1059 02/18/23 1622 02/19/23 0837 02/20/23 1057 02/21/23 1513 02/21/23 2012 02/22/23 0206  NA 141 142   < > 139 143 140 143  --  142  K 3.9 3.7   < > 3.5 4.2 4.7 >7.5* 4.8 4.7  CL 108 108   < > 102 105 105 108  --  104  CO2 25 25   < > 26 29 25 22   --  28  GLUCOSE 147* 106*   < > 105* 176* 85 111*  --  111*  BUN 42* 41*   < > 54* 52* 57* 61*  --  56*  CREATININE 1.64* 1.55*   < >  2.48* 2.26* 2.30* 1.91*  --  1.84*  CALCIUM 8.3* 8.1*   < > 8.4* 8.8* 8.6* 9.0  --  8.8*  MG 2.3  --   --   --   --   --   --   --   --   PHOS  --  4.8*  --   --   --   --   --   --   --    < > = values in this interval not displayed.    CBC: Recent Labs  Lab 02/15/23 1729 02/15/23 2022 02/16/23 1622 02/17/23 0312 02/19/23 0837  WBC 6.0  --  6.8 6.3 6.3  NEUTROABS 4.1  --   --   --  4.5  HGB 10.8* 13.9 11.0* 9.7* 10.4*  HCT 32.9* 41.0 35.2* 30.5* 34.2*  MCV 94.3  --  95.4 97.1 96.6  PLT 250  --  262 227 261    LFT Recent Labs  Lab 02/15/23 1729  AST 13*  ALT 9  ALKPHOS 65  BILITOT 0.7  PROT 7.2  ALBUMIN 3.9     Antibiotics: Anti-infectives (From admission, onward)    Start     Dose/Rate Route Frequency Ordered  Stop   02/17/23 1700  azithromycin (ZITHROMAX) tablet 500 mg        500 mg Oral Daily 02/17/23 1031 02/18/23 1557   02/16/23 1630  azithromycin (ZITHROMAX) 500 mg in sodium chloride 0.9 % 250 mL IVPB  Status:  Discontinued        500 mg 255 mL/hr over 60 Minutes Intravenous Every 24 hours 02/16/23 1539 02/17/23 1031   02/16/23 1615  cefTRIAXone (ROCEPHIN) 1 g in sodium chloride 0.9 % 100 mL IVPB  Status:  Discontinued        1 g 200 mL/hr over 30 Minutes Intravenous Every 24 hours 02/16/23 1529 02/17/23 1030   02/16/23 1615  azithromycin (ZITHROMAX) 500 mg in dextrose 5 % 250 mL IVPB  Status:  Discontinued        500 mg 250 mL/hr over 60 Minutes Intravenous Every 24 hours 02/16/23 1529 02/16/23 1539        DVT prophylaxis: Lovenox  Code Status: DNR  Family Communication: No family at bedside   CONSULTS    Subjective   Breathing has improved after she received IV Lasix 60 mg x 1 yesterday.  Diuresed well with Lasix.   Objective    Physical Examination:  General-appears in no acute distress Heart-S1-S2, regular, no murmur auscultated Lungs-clear to auscultation bilaterally, no wheezing or crackles auscultated Abdomen-soft, nontender, no organomegaly Extremities-trace edema in the lower extremities Neuro-alert, oriented x3, no focal deficit noted  Status is: Inpatient:             Meredeth Ide   [facility] If 7PM-7AM, please contact night-coverage at [email or web address], Office  [phone]   02/22/2023, 8:25 AM  LOS: 6 days

## 2023-02-23 DIAGNOSIS — N1832 Chronic kidney disease, stage 3b: Secondary | ICD-10-CM | POA: Diagnosis not present

## 2023-02-23 DIAGNOSIS — I10 Essential (primary) hypertension: Secondary | ICD-10-CM | POA: Diagnosis not present

## 2023-02-23 DIAGNOSIS — I5033 Acute on chronic diastolic (congestive) heart failure: Secondary | ICD-10-CM | POA: Diagnosis not present

## 2023-02-23 DIAGNOSIS — I509 Heart failure, unspecified: Secondary | ICD-10-CM | POA: Diagnosis not present

## 2023-02-23 LAB — GLUCOSE, CAPILLARY
Glucose-Capillary: 108 mg/dL — ABNORMAL HIGH (ref 70–99)
Glucose-Capillary: 105 mg/dL — ABNORMAL HIGH (ref 70–99)
Glucose-Capillary: 115 mg/dL — ABNORMAL HIGH (ref 70–99)
Glucose-Capillary: 174 mg/dL — ABNORMAL HIGH (ref 70–99)

## 2023-02-23 LAB — BASIC METABOLIC PANEL
Anion gap: 11 (ref 5–15)
BUN: 57 mg/dL — ABNORMAL HIGH (ref 8–23)
CO2: 30 mmol/L (ref 22–32)
Calcium: 9 mg/dL (ref 8.9–10.3)
Chloride: 102 mmol/L (ref 98–111)
Creatinine, Ser: 2.17 mg/dL — ABNORMAL HIGH (ref 0.44–1.00)
GFR, Estimated: 20 mL/min — ABNORMAL LOW (ref >60)
Glucose, Bld: 93 mg/dL (ref 70–99)
Potassium: 4.7 mmol/L (ref 3.5–5.1)
Sodium: 143 mmol/L (ref 135–145)

## 2023-02-23 NOTE — Progress Notes (Signed)
Refused to get back to bed.  Encouraged to call once ready to be in bed.[name]

## 2023-02-23 NOTE — Progress Notes (Signed)
                                                                                                                                                                                                          Palliative Medicine Progress Note   Patient Name: Sara Garza [facility]       Date: 02/23/2023 DOB: 10/31/1923  Age: 89 y.o. MRN#: [id number] Attending Physician: Meredeth Ide, MD Primary Care Physician: Shirline Frees, NP Admit Date: 02/15/2023  Reason for Consultation/Follow-up: {Reason for Consult:23484}  HPI/Patient Profile: 89 y.o. female with past medical history of HFpEF, CAD, severe to moderate mitral valve stenosis, HTN, s/p [name], CKD stage 2, diabetes admitted on 02/15/2023 with shortness of breath related to severe mitral valve stenosis. Diuresis recommended but limited by renal function.   Subjective: Chart reviewed including progress notes, labs, and imaging. Creatinine improved today.   I met with patient and her daughter Sara Braun at bedside.   Objective:  Physical Exam            LBM: Last BM Date : 02/21/23     Palliative Assessment/Data: ***     Palliative Medicine Assessment & Plan   Assessment: Principal Problem:   Acute on chronic diastolic CHF (congestive heart failure) (HCC) Active Problems:   Essential hypertension   CKD (chronic kidney disease), stage III (HCC)    Recommendations/Plan: DNR/DNI Continue supportive care with goal for medical optimization Plan is for SNF/rehab to improve functional status Outpatient palliative referral at discharge, with option to transition to hospice when patient returns home  Goals of Care and Additional Recommendations: Limitations on Scope of Treatment: {Recommended Scope and Preferences:21019}  Code Status:   Prognosis:  {Palliative Care Prognosis:23504}  Discharge Planning: {Palliative dispostion:23505}  Care plan was discussed with ***  Thank you for allowing the Palliative Medicine Team to assist in the  care of this patient.   ***   Merry Proud, NP   Please contact Palliative Medicine Team phone at [phone] for questions and concerns.  For individual providers, please see AMION.

## 2023-02-23 NOTE — Progress Notes (Signed)
Progress Note  Patient Name: Sara Garza Date of Encounter: 02/23/2023  Primary Cardiologist: Tonny Bollman, MD   Subjective  Patient seen and examined at her bedside. She was awake and offers no complaints.   Inpatient Medications    Scheduled Meds:  aspirin EC  81 mg Oral Daily   enoxaparin (LOVENOX) injection  30 mg Subcutaneous Q24H   ezetimibe  10 mg Oral Daily   insulin aspart  0-9 Units Subcutaneous TID WC   metoprolol tartrate  12.5 mg Oral BID   torsemide  20 mg Oral Daily   Continuous Infusions:  PRN Meds: acetaminophen **OR** acetaminophen, hydrOXYzine, polyethylene glycol   Vital Signs    Vitals:   02/23/23 0200 02/23/23 0320 02/23/23 0619 02/23/23 0754  BP:  121/60  (!) 116/47  Pulse: 72 73  87  Resp: 20 20  17   Temp:  (!) 97.1 F (36.2 C)  (!) 97.2 F (36.2 C)  TempSrc:  Axillary  Axillary  SpO2: 97% 96%  96%  Weight:   55.7 kg   Height:        Intake/Output Summary (Last 24 hours) at 02/23/2023 0927 Last data filed at 02/23/2023 0801 Gross per 24 hour  Intake 480 ml  Output 1750 ml  Net -1270 ml   Filed Weights   02/21/23 0619 02/22/23 0413 02/23/23 0619  Weight: 56.9 kg 55.7 kg 55.7 kg    Telemetry     - Personally Reviewed  ECG    None today  - Personally Reviewed  Physical Exam   General: Comfortable Head: Atraumatic, normal size  Eyes: PEERLA, EOMI  Neck: Supple, normal JVD Cardiac: Normal S1, S2; RRR; no murmurs, rubs, or gallops Lungs: Clear to auscultation bilaterally Abd: Soft, nontender, no hepatomegaly  Ext: warm, no edema Musculoskeletal: No deformities, BUE and BLE strength normal and equal Skin: Warm and dry, no rashes   Neuro: Alert and oriented to person, place, time, and situation, CNII-XII grossly intact, no focal deficits  Psych: Normal mood and affect   Labs    Chemistry Recent Labs  Lab 02/21/23 1513 02/21/23 2012 02/22/23 0206 02/23/23 0715  NA 143  --  142 143  K >7.5* 4.8 4.7 4.7  CL  108  --  104 102  CO2 22  --  28 30  GLUCOSE 111*  --  111* 93  BUN 61*  --  56* 57*  CREATININE 1.91*  --  1.84* 2.17*  CALCIUM 9.0  --  8.8* 9.0  GFRNONAA 24*  --  25* 20*  ANIONGAP 13  --  10 11     Hematology Recent Labs  Lab 02/16/23 1622 02/17/23 0312 02/19/23 0837  WBC 6.8 6.3 6.3  RBC 3.69* 3.14* 3.54*  3.47*  HGB 11.0* 9.7* 10.4*  HCT 35.2* 30.5* 34.2*  MCV 95.4 97.1 96.6  MCH 29.8 30.9 29.4  MCHC 31.3 31.8 30.4  RDW 13.0 13.0 12.8  PLT 262 227 261    Cardiac EnzymesNo results for input(s): "TROPONINI" in the last 168 hours. No results for input(s): "TROPIPOC" in the last 168 hours.   BNP Recent Labs  Lab 02/20/23 1057  BNP 1,358.0*     DDimer No results for input(s): "DDIMER" in the last 168 hours.   Radiology    No results found.  Cardiac Studies  Echo reviewed   Patient Profile     89 y.o. female with hx of CAD, HFpEF, CKD2, DM, HTN. She is s/p [name] for AS Hx  MS and MR Admitted 02/15/23 for sudden onset SOB   Assessment & Plan    Valvular heart disease ( severe mitral stenosis, mild to moderate MR, S/P [name]) - unfortunately with her overall clinical picture she is not a candidate for further valvular intervention. Goal from now on should ideally be symptom relief - agree with palliative care on board.   HFpEF - now on torsemide 20 mg daily - cr, appears to be worsening was 1.84 yesterday but today 2.17. Hold torsemide - she may need this on an as needed basis . Total output negative -1270 ml today.  CAD - stable.    For questions or updates, please contact [facility] Please consult [email or web address] for contact info under Cardiology/STEMI.      Signed, Thomasene Ripple, DO  02/23/2023, 9:27 AM

## 2023-02-23 NOTE — Plan of Care (Signed)
Problem: Education: Goal: Knowledge of General Education information will improve Description: Including pain rating scale, medication(s)/side effects and non-pharmacologic comfort measures Outcome: Progressing   Problem: Clinical Measurements: Goal: Respiratory complications will improve Outcome: Progressing Goal: Cardiovascular complication will be avoided Outcome: Progressing   Problem: Activity: Goal: Risk for activity intolerance will decrease Outcome: Progressing   Problem: Coping: Goal: Level of anxiety will decrease Outcome: Progressing   Problem: Elimination: Goal: Will not experience complications related to bowel motility Outcome: Progressing   Problem: Skin Integrity: Goal: Risk for impaired skin integrity will decrease Outcome: Progressing

## 2023-02-23 NOTE — Progress Notes (Signed)
[facility]  PROGRESS NOTE  TAMELA LAFFEY NWG:[id number] DOB: 1924/01/07 DOA: 02/15/2023 PCP: Shirline Frees, NP   Brief HPI:    89 y.o. female with medical history significant of congestive heart failure, preserved ejection fraction, prior known history of coronary artery disease chronic kidney disease type 2 diabetes mellitus, hypertension as well as prior transcatheter aortic valve replacement presents with acute diastolic CHF. She was started on IV lasix , without much improvement. Lasix held today for AKI. Palliative care consulted for goals of care.     Assessment/Plan:   Acute on chronic diastolic CHF -Initially started on Lasix IV 40 mg twice daily -Lasix  held due to worsening renal function -Echocardiogram showed preserved LVEF but severe MS -BNP elevated at 1358 -Cardiology following -1 dose of Lasix 60 mg IV given yesterday with good diuresis -Started on torsemide 20 mg daily yesterday, torsemide held by cardiology due to worsening creatinine   Mitral valve disease -Not a candidate for intervention as per cardiology -Diuresis held due to worsening renal function -Start metoprolol  and blood pressure is stable to allow for more diastolic filling  Acute hypoxemic respiratory failure -Still requiring  8 L/min oxygen HFNC -Completed treatment for acute bronchitis with Zithromax  Acute on CKD stage IIIb -Creatinine is worse today at 2.17   Hypertension -Blood pressure well-controlled  History of [name] -Stable on repeat echocardiogram  Diabetes mellitus type 2 -Insulin sliding scale with NovoLog -CBG well-controlled  Hypokalemia -Replete  Anemia of chronic disease -Baseline hemoglobin between 10 to 12 -Hemoglobin is 10.4 today.  Goals of care Palliative care consulted for goals of care Patient is DNR Plan to go to skilled nursing facility for rehab  Medications     aspirin EC  81 mg Oral Daily   enoxaparin (LOVENOX) injection  30 mg  Subcutaneous Q24H   ezetimibe  10 mg Oral Daily   insulin aspart  0-9 Units Subcutaneous TID WC   metoprolol tartrate  12.5 mg Oral BID   torsemide  20 mg Oral Daily     Data Reviewed:   CBG:  Recent Labs  Lab 02/22/23 0620 02/22/23 1146 02/22/23 1618 02/22/23 2049 02/23/23 0538  GLUCAP 114* 97 81 116* 108*    SpO2: 96 % O2 Flow Rate (L/min): 8 L/min    Vitals:   02/23/23 0200 02/23/23 0320 02/23/23 0619 02/23/23 0754  BP:  121/60  (!) 116/47  Pulse: 72 73  87  Resp: 20 20  17   Temp:  (!) 97.1 F (36.2 C)  (!) 97.2 F (36.2 C)  TempSrc:  Axillary  Axillary  SpO2: 97% 96%  96%  Weight:   55.7 kg   Height:          Data Reviewed:  Basic Metabolic Panel: Recent Labs  Lab 02/16/23 1622 02/17/23 0312 02/18/23 1059 02/19/23 0837 02/20/23 1057 02/21/23 1513 02/21/23 2012 02/22/23 0206 02/23/23 0715  NA 141 142   < > 143 140 143  --  142 143  K 3.9 3.7   < > 4.2 4.7 >7.5* 4.8 4.7 4.7  CL 108 108   < > 105 105 108  --  104 102  CO2 25 25   < > 29 25 22   --  28 30  GLUCOSE 147* 106*   < > 176* 85 111*  --  111* 93  BUN 42* 41*   < > 52* 57* 61*  --  56* 57*  CREATININE 1.64* 1.55*   < > 2.26*  2.30* 1.91*  --  1.84* 2.17*  CALCIUM 8.3* 8.1*   < > 8.8* 8.6* 9.0  --  8.8* 9.0  MG 2.3  --   --   --   --   --   --   --   --   PHOS  --  4.8*  --   --   --   --   --   --   --    < > = values in this interval not displayed.    CBC: Recent Labs  Lab 02/16/23 1622 02/17/23 0312 02/19/23 0837  WBC 6.8 6.3 6.3  NEUTROABS  --   --  4.5  HGB 11.0* 9.7* 10.4*  HCT 35.2* 30.5* 34.2*  MCV 95.4 97.1 96.6  PLT 262 227 261    LFT No results for input(s): "AST", "ALT", "ALKPHOS", "BILITOT", "PROT", "ALBUMIN" in the last 168 hours.    Antibiotics: Anti-infectives (From admission, onward)    Start     Dose/Rate Route Frequency Ordered Stop   02/17/23 1700  azithromycin (ZITHROMAX) tablet 500 mg        500 mg Oral Daily 02/17/23 1031 02/18/23 1557   02/16/23  1630  azithromycin (ZITHROMAX) 500 mg in sodium chloride 0.9 % 250 mL IVPB  Status:  Discontinued        500 mg 255 mL/hr over 60 Minutes Intravenous Every 24 hours 02/16/23 1539 02/17/23 1031   02/16/23 1615  cefTRIAXone (ROCEPHIN) 1 g in sodium chloride 0.9 % 100 mL IVPB  Status:  Discontinued        1 g 200 mL/hr over 30 Minutes Intravenous Every 24 hours 02/16/23 1529 02/17/23 1030   02/16/23 1615  azithromycin (ZITHROMAX) 500 mg in dextrose 5 % 250 mL IVPB  Status:  Discontinued        500 mg 250 mL/hr over 60 Minutes Intravenous Every 24 hours 02/16/23 1529 02/16/23 1539        DVT prophylaxis: Lovenox  Code Status: DNR  Family Communication: No family at bedside   CONSULTS    Subjective   Denies shortness of breath   Objective    Physical Examination:  General-appears in no acute distress Heart-S1-S2, regular, no murmur auscultated Lungs-bibasilar crackles auscultated Abdomen-soft, nontender, no organomegaly Extremities-no edema in the lower extremities Neuro-alert, oriented x3, no focal deficit noted  Status is: Inpatient:             Meredeth Ide   [facility] If 7PM-7AM, please contact night-coverage at [email or web address], Office  [phone]   02/23/2023, 8:30 AM  LOS: 7 days

## 2023-02-24 DIAGNOSIS — N1832 Chronic kidney disease, stage 3b: Secondary | ICD-10-CM | POA: Diagnosis not present

## 2023-02-24 DIAGNOSIS — Z7189 Other specified counseling: Secondary | ICD-10-CM | POA: Diagnosis not present

## 2023-02-24 DIAGNOSIS — I10 Essential (primary) hypertension: Secondary | ICD-10-CM | POA: Diagnosis not present

## 2023-02-24 DIAGNOSIS — I509 Heart failure, unspecified: Secondary | ICD-10-CM | POA: Diagnosis not present

## 2023-02-24 DIAGNOSIS — I5033 Acute on chronic diastolic (congestive) heart failure: Secondary | ICD-10-CM | POA: Diagnosis not present

## 2023-02-24 DIAGNOSIS — Z515 Encounter for palliative care: Secondary | ICD-10-CM | POA: Diagnosis not present

## 2023-02-24 LAB — BASIC METABOLIC PANEL
Anion gap: 8 (ref 5–15)
BUN: 64 mg/dL — ABNORMAL HIGH (ref 8–23)
CO2: 29 mmol/L (ref 22–32)
Calcium: 8.6 mg/dL — ABNORMAL LOW (ref 8.9–10.3)
Chloride: 105 mmol/L (ref 98–111)
Creatinine, Ser: 2.07 mg/dL — ABNORMAL HIGH (ref 0.44–1.00)
GFR, Estimated: 21 mL/min — ABNORMAL LOW (ref >60)
Glucose, Bld: 105 mg/dL — ABNORMAL HIGH (ref 70–99)
Potassium: 4.5 mmol/L (ref 3.5–5.1)
Sodium: 142 mmol/L (ref 135–145)

## 2023-02-24 LAB — GLUCOSE, CAPILLARY
Glucose-Capillary: 98 mg/dL (ref 70–99)
Glucose-Capillary: 196 mg/dL — ABNORMAL HIGH (ref 70–99)
Glucose-Capillary: 76 mg/dL (ref 70–99)
Glucose-Capillary: 224 mg/dL — ABNORMAL HIGH (ref 70–99)

## 2023-02-24 MED ORDER — TORSEMIDE 20 MG PO TABS
20.0000 mg | ORAL_TABLET | Freq: Every day | ORAL | Status: DC
  Administered 2023-02-24 – 2023-03-02 (×7): 20 mg via ORAL
  Filled 2023-02-24 (×7): qty 1

## 2023-02-24 NOTE — TOC Progression Note (Addendum)
Transition of Care [facility]) - Progression Note    Patient Details  Name: Sara Garza MRN: [id number] Date of Birth: 11-17-1923  Transition of Care [facility]) CM/SW Contact  Sara Coots, LCSW Phone Number: 02/24/2023, 11:27 AM  Clinical Narrative:     10:20 AM This CSW followed up with patient regarding SNF options at patient's bedside. Patient's daughter, Mylynn Romanick, was also present at bedside. Patient consented this CSW to speak in front of daughter. [name] and patient informed this CSW that they are deciding between [facility], [facility], and [facility]. [name] and patient asked CSW to follow up on bed availability at [facility], [facility], and [facility]. CSW accepted this request and suggested [name] and patient to choose between [facility], [facility], and [facility] in the meantime. [name] and patient accepted this suggestion.  3:45PM This CSW received a voicmail from patient's daughter, Ellenor Gotto, following up on availability at [facility], [facility], and [facility]. CSW returned Karen's call to inform her that [facility] was still at capacity and that there was still no response from [facility], but that [facility] provided a bed offer. CSW also stated that she would return to patient's bedside to provide [facility] ratings. [name] accepted this, and CSW returned to patient's bedside to provide her and [name] with [facility] rating. Palliative Care NP was in the room at the time and mentioned SNF contracts with palliative care agencies (Marcell Anger, [facility] the [place]). Patient and [name] expressed understanding of this information. CSW suggested [name] to call back later today with a SNF decision. [name] accepted this suggestion.  4:55 PM This CSW received a call from [place] who notified CSW of SNF decision [facility]).  Expected Discharge Plan: Skilled Nursing Facility Barriers to Discharge: Continued Medical Work up, SNF Pending discharge  orders, SNF Pending transportation, SNF Pending discharge summary, English as a second language teacher  Expected Discharge Plan and Services [place] Referral: Clinical Social Work Discharge Planning Services: CM Consult Post Acute Care Choice: Skilled Nursing Facility Living arrangements for the past 2 months: Single Family Home                                       Social Determinants of Health (SDOH) Interventions SDOH Screenings   Food Insecurity: No Food Insecurity (02/16/2023)  Housing: Low Risk  (02/16/2023)  Transportation Needs: No Transportation Needs (02/16/2023)  Utilities: Not At Risk (02/16/2023)  Alcohol Screen: Low Risk  (05/22/2022)  Depression (PHQ2-9): Low Risk  (05/22/2022)  Financial Resource Strain: Low Risk  (05/22/2022)  Physical Activity: Inactive (05/22/2022)  Social Connections: Moderately Integrated (05/22/2022)  Stress: No Stress Concern Present (05/22/2022)  Tobacco Use: Medium Risk (02/15/2023)    Readmission Risk Interventions     No data to display

## 2023-02-24 NOTE — Progress Notes (Signed)
Physical Therapy Treatment Patient Details Name: Sara Garza MRN: [id number] DOB: 05-Jul-1923 Today's Date: 02/24/2023   History of Present Illness Pt is 89 year old presented to [facility] on 02/15/23 for worsening SOB and BLE edema. Transferred to [facility] on  02/16/23. Pt with acute on chronic diastolic CHF. Pt also with severe mitral stenosis. PMH - CAD, CKD, DM, HTN, CHF, [name], Bil THR    PT Comments  Pt received in bed, daughter just arrived. Pt needed min A to come to EOB, able to rise to sitting position but needed assist at hips to scoot out of middle of bed. Pt ambulated 25' with RW and CGA and needed to urinate. Pt assisted to [facility] and was then able to walk another 25'. Pt with 3/4 DOE by end of ambulation. SPO2 remained in 90's on 8L O2 via [place], HR 106 bpm. Pt seated in recliner end of session and performed LE there ex for strengthening and blood flow. Patient will benefit from continued inpatient follow up therapy, <3 hours/day. PT will continue to follow.     If plan is discharge home, recommend the following: Assistance with cooking/housework;Assist for transportation;Help with stairs or ramp for entrance;A little help with walking and/or transfers   Can travel by private vehicle     No  Equipment Recommendations  Wheelchair (measurements PT);Wheelchair cushion (measurements PT)    Recommendations for Other Services       Precautions / Restrictions Precautions Precautions: Fall Precaution Comments: watch SpO2 and HR Restrictions Weight Bearing Restrictions: No     Mobility  Bed Mobility Overal bed mobility: Needs Assistance Bed Mobility: Supine to Sit     Supine to sit: Min assist, HOB elevated     General bed mobility comments: min A at hips with bedpad due to pt having difficulty scooting out of middle of bed. Increased time needed    Transfers Overall transfer level: Needs assistance Equipment used: Rolling walker (2 wheels) Transfers: Sit to/from  Stand Sit to Stand: Min assist, Contact guard assist           General transfer comment: sometimes CGA, occasional min A when fatigued just for initiation    Ambulation/Gait Ambulation/Gait assistance: Contact guard assist, +2 safety/equipment Gait Distance (Feet): 45 Feet (20', 25') Assistive device: Rolling walker (2 wheels) Gait Pattern/deviations: Step-through pattern, Decreased step length - right, Decreased step length - left, Narrow base of support Gait velocity: decreased Gait velocity interpretation: <1.8 ft/sec, indicate of risk for recurrent falls   General Gait Details: After 20' pt needed to urinate urgently, BSC brought out to hallway and pt surrounded with blanket so she could use bathroom. Continued ambulation after another 25', 3/4 DOE by end af ambulation, HR peak 106 bpm, SPO2 97% on 8L O2 via [place]   Stairs             Wheelchair Mobility     Tilt Bed    Modified Rankin (Stroke Patients Only)       Balance Overall balance assessment: Needs assistance Sitting-balance support: No upper extremity supported, Feet supported Sitting balance-Leahy Scale: Good Sitting balance - Comments: on EOB and on BSC   Standing balance support: Bilateral upper extremity supported, During functional activity, Reliant on assistive device for balance Standing balance-Leahy Scale: Fair Standing balance comment: pt fearful of falling, close guarding given for increased confidence of pt, esp as pt fatigued  Cognition Arousal: Alert Behavior During Therapy: WFL for tasks assessed/performed Overall Cognitive Status: Within Functional Limits for tasks assessed                                          Exercises Total Joint Exercises Ankle Circles/Pumps: AROM, Both, 20 reps, Supine, Seated Quad Sets: AROM, Both, 10 reps, Seated Gluteal Sets: AROM, Both, 10 reps, Seated    General Comments        Pertinent  Vitals/Pain Pain Assessment Pain Assessment: No/denies pain Breathing: normal    Home Living                          Prior Function            PT Goals (current goals can now be found in the care plan section) Acute Rehab PT Goals Patient Stated Goal: return home PT Goal Formulation: With patient Time For Goal Achievement: 03/03/23 Potential to Achieve Goals: Fair Progress towards PT goals: Progressing toward goals    Frequency    Min 1X/week      PT Plan      Co-evaluation              AM-PAC PT "6 Clicks" Mobility   Outcome Measure  Help needed turning from your back to your side while in a flat bed without using bedrails?: A Little Help needed moving from lying on your back to sitting on the side of a flat bed without using bedrails?: A Little Help needed moving to and from a bed to a chair (including a wheelchair)?: A Little Help needed standing up from a chair using your arms (e.g., wheelchair or bedside chair)?: A Little Help needed to walk in hospital room?: A Lot Help needed climbing 3-5 steps with a railing? : Total 6 Click Score: 15    End of Session Equipment Utilized During Treatment: Oxygen;Gait belt Activity Tolerance: Patient limited by fatigue;Patient tolerated treatment well Patient left: in chair;with call bell/phone within reach;with family/visitor present Nurse Communication: Mobility status PT Visit Diagnosis: Unsteadiness on feet (R26.81);Other abnormalities of gait and mobility (R26.89);Muscle weakness (generalized) (M62.81)     Time: 3664-4034 PT Time Calculation (min) (ACUTE ONLY): 48 min  Charges:    $Gait Training: 8-22 mins $Therapeutic Exercise: 8-22 mins $Therapeutic Activity: 8-22 mins PT General Charges $$ ACUTE PT VISIT: 1 Visit                     Lyanne Co, PT  Acute Rehab Services Secure chat preferred Office [phone]    Sara Garza Sara Garza 02/24/2023, 10:58 AM

## 2023-02-24 NOTE — Progress Notes (Signed)
Rounding Note    Patient Name: Sara Garza Date of Encounter: 02/24/2023  [facility] Cardiologist: Tonny Bollman, MD   Subjective   No CP or dyspnea  Inpatient Medications    Scheduled Meds:  aspirin EC  81 mg Oral Daily   enoxaparin (LOVENOX) injection  30 mg Subcutaneous Q24H   ezetimibe  10 mg Oral Daily   insulin aspart  0-9 Units Subcutaneous TID WC   metoprolol tartrate  12.5 mg Oral BID   Continuous Infusions:  PRN Meds: acetaminophen **OR** acetaminophen, hydrOXYzine, polyethylene glycol   Vital Signs    Vitals:   02/23/23 2323 02/24/23 0402 02/24/23 0624 02/24/23 0721  BP: (!) 112/49 (!) 117/51  128/63  Pulse: 73 73  86  Resp: 19 20  16   Temp: 98.1 F (36.7 C) 97.6 F (36.4 C)  97.8 F (36.6 C)  TempSrc: Oral Oral  Oral  SpO2: 93% 98%  96%  Weight:   (P) 55.1 kg   Height:        Intake/Output Summary (Last 24 hours) at 02/24/2023 0757 Last data filed at 02/24/2023 0737 Gross per 24 hour  Intake 540 ml  Output 675 ml  Net -135 ml      02/24/2023    6:24 AM 02/23/2023    6:19 AM 02/22/2023    4:13 AM  Last 3 Weights  Weight (lbs) 121 lb 7.6 oz 122 lb 12.7 oz 122 lb 12.7 oz  Weight (kg) 55.1 kg 55.7 kg 55.7 kg      Telemetry    Sinus with pacs- Personally Reviewed   Physical Exam   GEN: No acute distress.  Frail Neck: supple Cardiac: RRR Respiratory: Clear to auscultation bilaterally. GI: Soft, nontender, non-distended  MS: trace edema Neuro:  Nonfocal  Psych: Normal affect   Labs    High Sensitivity Troponin:   Recent Labs  Lab 02/15/23 1729 02/15/23 1916  TROPONINIHS 46* 39*     Chemistry Recent Labs  Lab 02/22/23 0206 02/23/23 0715 02/24/23 0206  NA 142 143 142  K 4.7 4.7 4.5  CL 104 102 105  CO2 28 30 29   GLUCOSE 111* 93 105*  BUN 56* 57* 64*  CREATININE 1.84* 2.17* 2.07*  CALCIUM 8.8* 9.0 8.6*  GFRNONAA 25* 20* 21*  ANIONGAP 10 11 8      Hematology Recent Labs  Lab 02/19/23 0837   WBC 6.3  RBC 3.54*  3.47*  HGB 10.4*  HCT 34.2*  MCV 96.6  MCH 29.4  MCHC 30.4  RDW 12.8  PLT 261    BNP Recent Labs  Lab 02/20/23 1057  BNP 1,358.0*    Patient Profile     89 y.o. female with past medical history of heart failure preserved EF, coronary artery disease, chronic kidney disease, diabetes mellitus, hypertension, history of [name], mitral stenosis for evaluation of acute on chronic heart failure preserved ejection fraction.  Most recent echocardiogram November 2024 showed normal LV function with aneurysmal mid ventricular septum, moderate left ventricular hypertrophy, severe left atrial enlargement, severe mitral stenosis with mean gradient 14 mmHg, mild to moderate mitral regurgitation, status post aortic valve replacement with mild aortic insufficiency and mean gradient 18.5 mmHg.  Assessment & Plan    1 acute on chronic heart failure preserved ejection fraction-patient denies dyspnea this morning.  Mildly volume overloaded on exam.  Goal should be to maintain comfort.  Will resume Demadex 20 mg daily and follow renal function.  2 valvular heart disease-patient has severe  mitral stenosis and history of [name].  Given her age she is not a candidate for further interventions.  Plan will be medical therapy.  Continue SBE prophylaxis.  3 history of coronary artery disease-continue aspirin and Zetia.  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  5 hypertension-continue present blood pressure medications.  6 acute on chronic stage IIIb kidney disease-will continue to follow renal function.  7 no CODE BLUE  For questions or updates, please contact [facility] Please consult [email or web address] for contact info under        Signed, Olga Millers, MD  02/24/2023, 7:57 AM

## 2023-02-24 NOTE — Progress Notes (Signed)
[facility]  PROGRESS NOTE  Sara MEDIC NWG:[id number] DOB: 1924-02-18 DOA: 02/15/2023 PCP: Shirline Frees, NP   Brief HPI:    89 y.o. female with medical history significant of congestive heart failure, preserved ejection fraction, prior known history of coronary artery disease chronic kidney disease type 2 diabetes mellitus, hypertension as well as prior transcatheter aortic valve replacement presents with acute diastolic CHF. She was started on IV lasix , without much improvement. Lasix held today for AKI. Palliative care consulted for goals of care.     Assessment/Plan:   Acute on chronic diastolic CHF -Initially started on Lasix IV 40 mg twice daily -Lasix  held due to worsening renal function -Echocardiogram showed preserved LVEF but severe MS -BNP elevated at 1358 -Cardiology following -1 dose of Lasix 60 mg IV given yesterday with good diuresis -Started on torsemide 20 mg daily yesterday, torsemide held by cardiology due to worsening creatinine -Goal is comfort, started on torsemide 20 mg per cardiology this morning.   Mitral valve disease -Not a candidate for intervention as per cardiology -Diuresis held due to worsening renal function -Start metoprolol  and blood pressure is stable to allow for more diastolic filling  Acute hypoxemic respiratory failure -Still requiring  8 L/min oxygen HFNC -Completed treatment for acute bronchitis with Zithromax  Acute on CKD stage IIIb -Creatinine is 2.07 today   Hypertension -Blood pressure well-controlled  History of [name] -Stable on repeat echocardiogram  Diabetes mellitus type 2 -Insulin sliding scale with NovoLog -CBG well-controlled  Hypokalemia -Replete  Anemia of chronic disease -Baseline hemoglobin between 10 to 12 -Hemoglobin is 10.4 today.  Goals of care Palliative care consulted for goals of care Patient is DNR Plan to go to skilled nursing facility for rehab  Medications     aspirin EC  81  mg Oral Daily   enoxaparin (LOVENOX) injection  30 mg Subcutaneous Q24H   ezetimibe  10 mg Oral Daily   insulin aspart  0-9 Units Subcutaneous TID WC   metoprolol tartrate  12.5 mg Oral BID   torsemide  20 mg Oral Daily     Data Reviewed:   CBG:  Recent Labs  Lab 02/23/23 0538 02/23/23 1112 02/23/23 1615 02/23/23 2123 02/24/23 0622  GLUCAP 108* 105* 115* 174* 98    SpO2: 96 % O2 Flow Rate (L/min): 8 L/min    Vitals:   02/23/23 2323 02/24/23 0402 02/24/23 0624 02/24/23 0721  BP: (!) 112/49 (!) 117/51  128/63  Pulse: 73 73  86  Resp: 19 20  16   Temp: 98.1 F (36.7 C) 97.6 F (36.4 C)  97.8 F (36.6 C)  TempSrc: Oral Oral  Oral  SpO2: 93% 98%  96%  Weight:   (P) 55.1 kg   Height:          Data Reviewed:  Basic Metabolic Panel: Recent Labs  Lab 02/20/23 1057 02/21/23 1513 02/21/23 2012 02/22/23 0206 02/23/23 0715 02/24/23 0206  NA 140 143  --  142 143 142  K 4.7 >7.5* 4.8 4.7 4.7 4.5  CL 105 108  --  104 102 105  CO2 25 22  --  28 30 29   GLUCOSE 85 111*  --  111* 93 105*  BUN 57* 61*  --  56* 57* 64*  CREATININE 2.30* 1.91*  --  1.84* 2.17* 2.07*  CALCIUM 8.6* 9.0  --  8.8* 9.0 8.6*    CBC: Recent Labs  Lab 02/19/23 0837  WBC 6.3  NEUTROABS 4.5  HGB  10.4*  HCT 34.2*  MCV 96.6  PLT 261    LFT No results for input(s): "AST", "ALT", "ALKPHOS", "BILITOT", "PROT", "ALBUMIN" in the last 168 hours.    Antibiotics: Anti-infectives (From admission, onward)    Start     Dose/Rate Route Frequency Ordered Stop   02/17/23 1700  azithromycin (ZITHROMAX) tablet 500 mg        500 mg Oral Daily 02/17/23 1031 02/18/23 1557   02/16/23 1630  azithromycin (ZITHROMAX) 500 mg in sodium chloride 0.9 % 250 mL IVPB  Status:  Discontinued        500 mg 255 mL/hr over 60 Minutes Intravenous Every 24 hours 02/16/23 1539 02/17/23 1031   02/16/23 1615  cefTRIAXone (ROCEPHIN) 1 g in sodium chloride 0.9 % 100 mL IVPB  Status:  Discontinued        1 g 200 mL/hr  over 30 Minutes Intravenous Every 24 hours 02/16/23 1529 02/17/23 1030   02/16/23 1615  azithromycin (ZITHROMAX) 500 mg in dextrose 5 % 250 mL IVPB  Status:  Discontinued        500 mg 250 mL/hr over 60 Minutes Intravenous Every 24 hours 02/16/23 1529 02/16/23 1539        DVT prophylaxis: Lovenox  Code Status: DNR  Family Communication: No family at bedside   CONSULTS    Subjective   Denies shortness of breath.  Feels better this morning.   Objective    Physical Examination:  General-appears in no acute distress Heart-S1-S2, regular, no murmur auscultated Lungs-lungs clear to auscultation bilaterally Abdomen-soft, nontender, no organomegaly Extremities-no edema in the lower extremities Neuro-alert, oriented x3, no focal deficit noted  Status is: Inpatient:             Meredeth Ide   [facility] If 7PM-7AM, please contact night-coverage at [email or web address], Office  [phone]   02/24/2023, 8:14 AM  LOS: 8 days

## 2023-02-25 DIAGNOSIS — N1832 Chronic kidney disease, stage 3b: Secondary | ICD-10-CM | POA: Diagnosis not present

## 2023-02-25 DIAGNOSIS — I5033 Acute on chronic diastolic (congestive) heart failure: Secondary | ICD-10-CM | POA: Diagnosis not present

## 2023-02-25 DIAGNOSIS — I509 Heart failure, unspecified: Secondary | ICD-10-CM | POA: Diagnosis not present

## 2023-02-25 DIAGNOSIS — J9601 Acute respiratory failure with hypoxia: Secondary | ICD-10-CM | POA: Diagnosis not present

## 2023-02-25 LAB — BASIC METABOLIC PANEL
Anion gap: 9 (ref 5–15)
BUN: 65 mg/dL — ABNORMAL HIGH (ref 8–23)
CO2: 30 mmol/L (ref 22–32)
Calcium: 8.6 mg/dL — ABNORMAL LOW (ref 8.9–10.3)
Chloride: 104 mmol/L (ref 98–111)
Creatinine, Ser: 1.95 mg/dL — ABNORMAL HIGH (ref 0.44–1.00)
GFR, Estimated: 23 mL/min — ABNORMAL LOW (ref >60)
Glucose, Bld: 108 mg/dL — ABNORMAL HIGH (ref 70–99)
Potassium: 4.3 mmol/L (ref 3.5–5.1)
Sodium: 143 mmol/L (ref 135–145)

## 2023-02-25 LAB — GLUCOSE, CAPILLARY
Glucose-Capillary: 136 mg/dL — ABNORMAL HIGH (ref 70–99)
Glucose-Capillary: 147 mg/dL — ABNORMAL HIGH (ref 70–99)
Glucose-Capillary: 129 mg/dL — ABNORMAL HIGH (ref 70–99)
Glucose-Capillary: 154 mg/dL — ABNORMAL HIGH (ref 70–99)

## 2023-02-25 NOTE — Progress Notes (Signed)
Rounding Note    Patient Name: Sara Garza [facility] Date of Encounter: 02/25/2023  [facility] Cardiologist: Tonny Bollman, MD   Subjective   Pt denies CP or dyspnea  Inpatient Medications    Scheduled Meds:  aspirin EC  81 mg Oral Daily   enoxaparin (LOVENOX) injection  30 mg Subcutaneous Q24H   ezetimibe  10 mg Oral Daily   insulin aspart  0-9 Units Subcutaneous TID WC   metoprolol tartrate  12.5 mg Oral BID   torsemide  20 mg Oral Daily   Continuous Infusions:  PRN Meds: acetaminophen **OR** acetaminophen, hydrOXYzine, polyethylene glycol   Vital Signs    Vitals:   02/24/23 2105 02/24/23 2106 02/24/23 2332 02/25/23 0459  BP:  (!) 125/50 (!) 134/48 (!) 132/42  Pulse: 99 93 67 84  Resp:  (!) 21 16 20   Temp:   97.6 F (36.4 C) 97.6 F (36.4 C)  TempSrc:   Oral Oral  SpO2:  96% 95% 96%  Weight:    55 kg  Height:        Intake/Output Summary (Last 24 hours) at 02/25/2023 0720 Last data filed at 02/25/2023 0500 Gross per 24 hour  Intake 450 ml  Output 400 ml  Net 50 ml      02/25/2023    4:59 AM 02/24/2023    6:24 AM 02/23/2023    6:19 AM  Last 3 Weights  Weight (lbs) 121 lb 4.1 oz 121 lb 7.6 oz 122 lb 12.7 oz  Weight (kg) 55 kg 55.1 kg 55.7 kg      Telemetry    Sinus with pacs- Personally Reviewed   Physical Exam   GEN: NAD Neck: supple, no adenopathy Cardiac: RRR, no rub Respiratory: CTA GI: Soft, NT/ND MS: trace edema Neuro: Grossly intact Psych: Normal affect   Labs    High Sensitivity Troponin:   Recent Labs  Lab 02/15/23 1729 02/15/23 1916  TROPONINIHS 46* 39*     Chemistry Recent Labs  Lab 02/23/23 0715 02/24/23 0206 02/25/23 0210  NA 143 142 143  K 4.7 4.5 4.3  CL 102 105 104  CO2 30 29 30   GLUCOSE 93 105* 108*  BUN 57* 64* 65*  CREATININE 2.17* 2.07* 1.95*  CALCIUM 9.0 8.6* 8.6*  GFRNONAA 20* 21* 23*  ANIONGAP 11 8 9      Hematology Recent Labs  Lab 02/19/23 0837  WBC 6.3  RBC 3.54*  3.47*   HGB 10.4*  HCT 34.2*  MCV 96.6  MCH 29.4  MCHC 30.4  RDW 12.8  PLT 261    BNP Recent Labs  Lab 02/20/23 1057  BNP 1,358.0*    Patient Profile     89 y.o. female with past medical history of heart failure preserved EF, coronary artery disease, chronic kidney disease, diabetes mellitus, hypertension, history of [name], mitral stenosis for evaluation of acute on chronic heart failure preserved ejection fraction.  Most recent echocardiogram November 2024 showed normal LV function with aneurysmal mid ventricular septum, moderate left ventricular hypertrophy, severe left atrial enlargement, severe mitral stenosis with mean gradient 14 mmHg, mild to moderate mitral regurgitation, status post aortic valve replacement with mild aortic insufficiency and mean gradient 18.5 mmHg.  Assessment & Plan    1 acute on chronic heart failure preserved ejection fraction-patient appears to be reasonably well compensated this morning.  We are balancing keeping euvolemia versus worsening renal function.  Will continue Demadex at present dose.  2 valvular heart disease-patient has severe mitral stenosis  and history of [name].  Given her age she is not a candidate for further interventions.  Plan will be medical therapy.  Continue SBE prophylaxis.  3 history of coronary artery disease-continue aspirin and Zetia.  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  5 hypertension-blood pressure is controlled.  Continue present medical regimen.  6 acute on chronic stage IIIb kidney disease-continue to follow renal function..  7 no CODE BLUE  Would mobilize.  Likely can be discharged from a cardiac standpoint.  She will likely need home O2 at discharge.  For questions or updates, please contact [facility] Please consult [email or web address] for contact info under        Signed, Olga Millers, MD  02/25/2023, 7:20 AM

## 2023-02-25 NOTE — TOC Progression Note (Signed)
Transition of Care [facility]) - Progression Note    Patient Details  Name: Sara Garza MRN: [id number] Date of Birth: September 07, 1923  [facility] CM/SW Contact  Marliss Coots, LCSW Phone Number: 02/25/2023, 11:02 AM  Clinical Narrative:     This CSW contacted [place] at [facility] to accept bed offer on behalf of patient. CSW notified [place] of patient's current oxygen needs (8L hi flo). [place] informed this CSW that patient can be admitted to [facility] at no more than 4L of oxygen.  Expected Discharge Plan: Skilled Nursing Facility Barriers to Discharge: Continued Medical Work up, SNF Pending discharge orders, SNF Pending transportation, SNF Pending discharge summary, English as a second language teacher  Expected Discharge Plan and Services In-[place] Referral: Clinical Social Work Discharge Planning Services: CM Consult Post Acute Care Choice: Skilled Nursing Facility Living arrangements for the past 2 months: Single Family Home                                       Social Determinants of Health (SDOH) Interventions SDOH Screenings   Food Insecurity: No Food Insecurity (02/16/2023)  Housing: Low Risk  (02/16/2023)  Transportation Needs: No Transportation Needs (02/16/2023)  Utilities: Not At Risk (02/16/2023)  Alcohol Screen: Low Risk  (05/22/2022)  Depression (PHQ2-9): Low Risk  (05/22/2022)  Financial Resource Strain: Low Risk  (05/22/2022)  Physical Activity: Inactive (05/22/2022)  Social Connections: Moderately Integrated (05/22/2022)  Stress: No Stress Concern Present (05/22/2022)  Tobacco Use: Medium Risk (02/15/2023)    Readmission Risk Interventions     No data to display

## 2023-02-25 NOTE — Progress Notes (Signed)
Plan of care was reviewed. Pt has been progressing. Alert and fully oriented x 4, afebrile, stable hemodynamically, on HFNCL 8 LPM, able to maintain SPO2 92-98%. We tried to wean O2 to 6 LPM, but SPO2 dropped to 89%, so she has been continuing on HFNCL 8LPM overnight. Pt denied SOB. Breathing exercise with incentive spirometer was encouraged. No productive cough, no acute distress noted. We will continue to monitor.   Filiberto Pinks, RN

## 2023-02-25 NOTE — Progress Notes (Signed)
[facility]  PROGRESS NOTE  Sara Garza GNF:[id number] DOB: 12/03/1923 DOA: 02/15/2023 PCP: Shirline Frees, NP   Brief HPI:    89 y.o. female with medical history significant of congestive heart failure, preserved ejection fraction, prior known history of coronary artery disease chronic kidney disease type 2 diabetes mellitus, hypertension as well as prior transcatheter aortic valve replacement presents with acute diastolic CHF.  Diuresed with IV Lasix, switch to p.o. Demadex.. Palliative care consulted for goals of care.  Patient to go to skilled nursing facility with palliative care follow-up as outpatient    Assessment/Plan:   Acute on chronic diastolic CHF -Initially started on Lasix IV 40 mg twice daily -Lasix  held due to worsening renal function -Echocardiogram showed preserved LVEF but severe MS -BNP elevated at 1358 -Cardiology following -Started back on IV Lasix, now switched to p.o. torsemide 20 mg daily    Mitral valve disease -Not a candidate for intervention as per cardiology -Diuresis held due to worsening renal function -Start metoprolol  and blood pressure is stable to allow for more diastolic filling  Acute hypoxemic respiratory failure -Still requiring  8 L/min oxygen HFNC'; wean off oxygen as tolerated -Will go to skilled nursing facility once oxygen has been weaned off to less than 5 L/min -Completed treatment for acute bronchitis with Zithromax  Acute on CKD stage IIIb -Creatinine is 1.95 today -Follow BMP in am   Hypertension -Blood pressure well-controlled  History of [name] -Stable on repeat echocardiogram  Diabetes mellitus type 2 -Insulin sliding scale with NovoLog -CBG well-controlled  Hypokalemia -Replete  Anemia of chronic disease -Baseline hemoglobin between 10 to 12 -Hemoglobin is 10.4 today.  Goals of care Palliative care consulted for goals of care Patient is DNR Plan to go to skilled nursing facility for rehab; with  palliative care follow-up, option to transition to hospice when patient returns home  Medications     aspirin EC  81 mg Oral Daily   enoxaparin (LOVENOX) injection  30 mg Subcutaneous Q24H   ezetimibe  10 mg Oral Daily   insulin aspart  0-9 Units Subcutaneous TID WC   metoprolol tartrate  12.5 mg Oral BID   torsemide  20 mg Oral Daily     Data Reviewed:   CBG:  Recent Labs  Lab 02/24/23 0622 02/24/23 1208 02/24/23 1657 02/24/23 2134 02/25/23 0613  GLUCAP 98 196* 76 224* 136*    SpO2: 93 % O2 Flow Rate (L/min): 8 L/min    Vitals:   02/24/23 2106 02/24/23 2332 02/25/23 0459 02/25/23 0716  BP: (!) 125/50 (!) 134/48 (!) 132/42 (!) 146/55  Pulse: 93 67 84 79  Resp: (!) 21 16 20 19   Temp:  97.6 F (36.4 C) 97.6 F (36.4 C) 97.8 F (36.6 C)  TempSrc:  Oral Oral Oral  SpO2: 96% 95% 96% 93%  Weight:   55 kg   Height:          Data Reviewed:  Basic Metabolic Panel: Recent Labs  Lab 02/21/23 1513 02/21/23 2012 02/22/23 0206 02/23/23 0715 02/24/23 0206 02/25/23 0210  NA 143  --  142 143 142 143  K >7.5* 4.8 4.7 4.7 4.5 4.3  CL 108  --  104 102 105 104  CO2 22  --  28 30 29 30   GLUCOSE 111*  --  111* 93 105* 108*  BUN 61*  --  56* 57* 64* 65*  CREATININE 1.91*  --  1.84* 2.17* 2.07* 1.95*  CALCIUM 9.0  --  8.8* 9.0 8.6* 8.6*    CBC: Recent Labs  Lab 02/19/23 0837  WBC 6.3  NEUTROABS 4.5  HGB 10.4*  HCT 34.2*  MCV 96.6  PLT 261    LFT No results for input(s): "AST", "ALT", "ALKPHOS", "BILITOT", "PROT", "ALBUMIN" in the last 168 hours.    Antibiotics: Anti-infectives (From admission, onward)    Start     Dose/Rate Route Frequency Ordered Stop   02/17/23 1700  azithromycin (ZITHROMAX) tablet 500 mg        500 mg Oral Daily 02/17/23 1031 02/18/23 1557   02/16/23 1630  azithromycin (ZITHROMAX) 500 mg in sodium chloride 0.9 % 250 mL IVPB  Status:  Discontinued        500 mg 255 mL/hr over 60 Minutes Intravenous Every 24 hours 02/16/23 1539  02/17/23 1031   02/16/23 1615  cefTRIAXone (ROCEPHIN) 1 g in sodium chloride 0.9 % 100 mL IVPB  Status:  Discontinued        1 g 200 mL/hr over 30 Minutes Intravenous Every 24 hours 02/16/23 1529 02/17/23 1030   02/16/23 1615  azithromycin (ZITHROMAX) 500 mg in dextrose 5 % 250 mL IVPB  Status:  Discontinued        500 mg 250 mL/hr over 60 Minutes Intravenous Every 24 hours 02/16/23 1529 02/16/23 1539        DVT prophylaxis: Lovenox  Code Status: DNR  Family Communication: No family at bedside   CONSULTS    Subjective   Denies shortness of breath or chest pain   Objective    Physical Examination:  Appears in no acute distress S1-S2, regular Lungs clear to auscultation bilaterally Abdomen is soft, nontender, no organomegaly  Status is: Inpatient:             Meredeth Ide   [facility] If 7PM-7AM, please contact night-coverage at [email or web address], Office  [phone]   02/25/2023, 8:15 AM  LOS: 9 days

## 2023-02-25 NOTE — Progress Notes (Signed)
Occupational Therapy Treatment Patient Details Name: Sara Garza MRN: [id number] DOB: 03-10-1924 Today's Date: 02/25/2023   History of present illness Pt is 89 year old presented to [facility] on 02/15/23 for worsening SOB and BLE edema. Transferred to [facility] on  02/16/23. Pt with acute on chronic diastolic CHF. Pt also with severe mitral stenosis. PMH - CAD, CKD, DM, HTN, CHF, [name], Bil THR   OT comments  Pt progressing toward established OT goals. Focus session on mobility, activity tolerance, safety, and ADL. Challenging activity tolerance walking to sink, grooming, then walking around bed to chair. Able to wash face standing but needing seated break during oral care and walking back around bed both for extended periods of time secondary to fatigue. Pt SpO2 as low as 87% with good pleth but recovers quickly seated or standing with cues for pursed lip breathing through nose. Will continue to follow. Patient will benefit from continued inpatient follow up therapy, <3 hours/day       If plan is discharge home, recommend the following:  A little help with walking and/or transfers;A lot of help with bathing/dressing/bathroom;Assistance with cooking/housework;Assist for transportation   Equipment Recommendations  Other (comment) (TBD next venue)    Recommendations for Other Services      Precautions / Restrictions Precautions Precautions: Fall Precaution Comments: watch SpO2 and HR Restrictions Weight Bearing Restrictions: No       Mobility Bed Mobility Overal bed mobility: Needs Assistance Bed Mobility: Supine to Sit     Supine to sit: Contact guard          Transfers Overall transfer level: Needs assistance Equipment used: Rolling walker (2 wheels) Transfers: Sit to/from Stand Sit to Stand: Contact guard assist, Min assist           General transfer comment: CGA 3/4 attempts; min A 1x. cues for hand placement all attempts     Balance Overall balance  assessment: Needs assistance Sitting-balance support: No upper extremity supported, Feet supported Sitting balance-Leahy Scale: Good Sitting balance - Comments: statically   Standing balance support: Bilateral upper extremity supported, During functional activity, Reliant on assistive device for balance Standing balance-Leahy Scale: Fair                             ADL either performed or assessed with clinical judgement   ADL Overall ADL's : Needs assistance/impaired     Grooming: Wash/dry face;Standing;Contact guard assist Grooming Details (indicate cue type and reason): with bil elbow support on sink. Also performed oral care sitting in chair by sink with set-up A                             Functional mobility during ADLs: Contact guard assist;Rolling walker (2 wheels)      Extremity/Trunk Assessment Upper Extremity Assessment Upper Extremity Assessment: Generalized weakness            Vision       Perception     Praxis      Cognition Arousal: Alert Behavior During Therapy: WFL for tasks assessed/performed Overall Cognitive Status: No family/caregiver present to determine baseline cognitive functioning                                 General Comments: Pt following all commands with increased time. some decresed health literacy in regard to  O2 demand. fair awareness of abilities.        Exercises Other Exercises Other Exercises: IS with cues for optimal technique 10 reps    Shoulder Instructions       General Comments 8LO2 via ; desat as low as 86% with good Pleth during session, but coming back up with 1 cue to breathe through nose    Pertinent Vitals/ Pain       Pain Assessment Pain Assessment: No/denies pain  Home Living                                          Prior Functioning/Environment              Frequency  Min 1X/week        Progress Toward Goals  OT Goals(current  goals can now be found in the care plan section)  Progress towards OT goals: Progressing toward goals  Acute Rehab OT Goals OT Goal Formulation: With patient Time For Goal Achievement: 03/04/23 Potential to Achieve Goals: Good ADL Goals Pt Will Perform Grooming: with contact guard assist;standing Pt Will Perform Upper Body Dressing: with set-up;sitting Pt Will Perform Lower Body Dressing: with contact guard assist;sit to/from stand Pt Will Transfer to Toilet: with contact guard assist;ambulating Additional ADL Goal #1: Pt will demo use of IS with mod I  Plan      Co-evaluation                 AM-PAC OT "6 Clicks" Daily Activity     Outcome Measure   Help from another person eating meals?: None Help from another person taking care of personal grooming?: A Little Help from another person toileting, which includes using toliet, bedpan, or urinal?: A Lot Help from another person bathing (including washing, rinsing, drying)?: A Lot Help from another person to put on and taking off regular upper body clothing?: A Lot Help from another person to put on and taking off regular lower body clothing?: Total 6 Click Score: 14    End of Session Equipment Utilized During Treatment: Gait belt;Rolling walker (2 wheels);Oxygen  OT Visit Diagnosis: Other abnormalities of gait and mobility (R26.89);Muscle weakness (generalized) (M62.81)   Activity Tolerance Patient tolerated treatment well   Patient Left in chair;with call bell/phone within reach;with chair alarm set   Nurse Communication Mobility status        Time: 6213-0865 OT Time Calculation (min): 56 min  Charges: OT General Charges $OT Visit: 1 Visit OT Treatments $Self Care/Home Management : 38-52 mins $Therapeutic Exercise: 8-22 mins  Myrla Halsted, OTD, OTR/L [facility] Acute Rehabilitation Office: [phone]   Myrla Halsted 02/25/2023, 12:25 PM

## 2023-02-25 NOTE — Progress Notes (Signed)
                                                                                                                                                                                                          Palliative Medicine Progress Note   Patient Name: Sara [facility]       Date: 02/25/2023 DOB: [birth year]  Age: 89 y.o. MRN#: [id number] Attending Physician: Sara Ide, MD Primary Care Physician: Sara Frees, NP Admit Date: 02/15/2023   HPI/Patient Profile: 89 y.o. female with past medical history of HFpEF, CAD, severe to moderate mitral valve stenosis, HTN, s/p [name], CKD stage 2, diabetes admitted on 02/15/2023 with shortness of breath related to severe mitral valve stenosis. Diuresis recommended but limited by renal function.   Subjective: Chart reviewed. Patient ambulated 50 feet today with PT, with dyspnea on exertion by end of ambulation.   Bedside visit with patient and daughter. Patient reports she did not sleep well last night, but otherwise no complaints. They are trying to decide on a rehab facility. CSW also in the room - discussed need for outpatient palliative referral once rehab facility has been decided.    Objective:  Physical Exam Vitals reviewed.  Constitutional:      General: She is not in acute distress.    Comments: Frail, chronically ill-appearing  Pulmonary:     Effort: Pulmonary effort is normal.  Neurological:     Mental Status: She is alert and oriented to person, place, and time.             Palliative Medicine Assessment & Plan   Assessment: Principal Problem:   Acute on chronic diastolic CHF (congestive heart failure) (HCC) Active Problems:   Essential hypertension   CKD (chronic kidney disease), stage III (HCC)    Recommendations/Plan: Continue supportive care with goal for medical optimization Plan is for SNF/rehab to improve functional status Outpatient palliative referral at discharge, with option to transition to hospice when patient  returns home  Code Status: DNR/DNI   Prognosis:  < 6 months  Discharge Planning: Skilled Nursing Facility for rehab with Palliative care service follow-up    Thank you for allowing the Palliative Medicine Team to assist in the care of this patient.   Time: 25 minutes   Sara Proud, NP   Please contact Palliative Medicine Team phone at [phone] for questions and concerns.  For individual providers, please see AMION.

## 2023-02-26 ENCOUNTER — Encounter: Payer: Medicare Other | Admitting: Adult Health

## 2023-02-26 DIAGNOSIS — I5033 Acute on chronic diastolic (congestive) heart failure: Secondary | ICD-10-CM | POA: Diagnosis not present

## 2023-02-26 DIAGNOSIS — I5021 Acute systolic (congestive) heart failure: Secondary | ICD-10-CM

## 2023-02-26 LAB — CBC WITH DIFFERENTIAL/PLATELET
Abs Immature Granulocytes: 0.02 10*3/uL (ref 0.00–0.07)
Basophils Absolute: 0 10*3/uL (ref 0.0–0.1)
Basophils Relative: 1 %
Eosinophils Absolute: 0.3 10*3/uL (ref 0.0–0.5)
Eosinophils Relative: 5 %
HCT: 30.5 % — ABNORMAL LOW (ref 36.0–46.0)
Hemoglobin: 9.4 g/dL — ABNORMAL LOW (ref 12.0–15.0)
Immature Granulocytes: 0 %
Lymphocytes Relative: 10 %
Lymphs Abs: 0.6 10*3/uL — ABNORMAL LOW (ref 0.7–4.0)
MCH: 29.8 pg (ref 26.0–34.0)
MCHC: 30.8 g/dL (ref 30.0–36.0)
MCV: 96.8 fL (ref 80.0–100.0)
Monocytes Absolute: 0.7 10*3/uL (ref 0.1–1.0)
Monocytes Relative: 11 %
Neutro Abs: 4.3 10*3/uL (ref 1.7–7.7)
Neutrophils Relative %: 73 %
Platelets: 209 10*3/uL (ref 150–400)
RBC: 3.15 MIL/uL — ABNORMAL LOW (ref 3.87–5.11)
RDW: 12.4 % (ref 11.5–15.5)
WBC: 5.9 10*3/uL (ref 4.0–10.5)
nRBC: 0 % (ref 0.0–0.2)

## 2023-02-26 LAB — COMPREHENSIVE METABOLIC PANEL
ALT: 13 U/L (ref 0–44)
AST: 15 U/L (ref 15–41)
Albumin: 2.5 g/dL — ABNORMAL LOW (ref 3.5–5.0)
Alkaline Phosphatase: 49 U/L (ref 38–126)
Anion gap: 11 (ref 5–15)
BUN: 60 mg/dL — ABNORMAL HIGH (ref 8–23)
CO2: 30 mmol/L (ref 22–32)
Calcium: 8.8 mg/dL — ABNORMAL LOW (ref 8.9–10.3)
Chloride: 102 mmol/L (ref 98–111)
Creatinine, Ser: 2.03 mg/dL — ABNORMAL HIGH (ref 0.44–1.00)
GFR, Estimated: 22 mL/min — ABNORMAL LOW (ref >60)
Glucose, Bld: 132 mg/dL — ABNORMAL HIGH (ref 70–99)
Potassium: 4.2 mmol/L (ref 3.5–5.1)
Sodium: 143 mmol/L (ref 135–145)
Total Bilirubin: 0.7 mg/dL (ref <1.2)
Total Protein: 6.1 g/dL — ABNORMAL LOW (ref 6.5–8.1)

## 2023-02-26 LAB — GLUCOSE, CAPILLARY
Glucose-Capillary: 93 mg/dL (ref 70–99)
Glucose-Capillary: 137 mg/dL — ABNORMAL HIGH (ref 70–99)
Glucose-Capillary: 103 mg/dL — ABNORMAL HIGH (ref 70–99)
Glucose-Capillary: 197 mg/dL — ABNORMAL HIGH (ref 70–99)

## 2023-02-26 MED ORDER — ORAL CARE MOUTH RINSE: 15.0000 mL | OROMUCOSAL | Status: DC | PRN

## 2023-02-26 NOTE — Progress Notes (Signed)
PROGRESS NOTE    Sara Garza  HCW:[id number] DOB: 1924/01/18 DOA: 02/15/2023 PCP: Shirline Frees, NP  Chief Complaint  Patient presents with   Shortness of Breath    Hospital Course:  Sara Garza is 89 y.o. female with heart failure with preserved EF, CAD, CKD, diabetes, hypertension, prior transcatheter aortic valve replacement, who presents on this admission with acute CHF exacerbation.  Cardiology was consulted.  Patient has been diuresed with IV Lasix and is now been switched to p.o.  Patient has had difficulty weaning from oxygen.  Palliative care was consulted for goals of care.  Currently the plan is to discharge to skilled nursing facility with palliative care as follow-up.  Subjective: No acute events overnight.  Patient is currently on 6 L O2 and saturating above 90%.  She reports that she cannot tell the difference between 8 L and 6 L and she is feeling encouraged that she will continue to wean oxygen.   Objective: Vitals:   02/25/23 2258 02/26/23 0611 02/26/23 0618 02/26/23 0728  BP: (!) 101/47 (!) 120/57  (!) 120/51  Pulse: 69 83  85  Resp: 20 20  20   Temp: 97.8 F (36.6 C) 97.6 F (36.4 C)  97.6 F (36.4 C)  TempSrc: Oral Oral  Oral  SpO2: 93% 95%  95%  Weight:   54.9 kg   Height:        Intake/Output Summary (Last 24 hours) at 02/26/2023 0742 Last data filed at 02/26/2023 0612 Gross per 24 hour  Intake 680 ml  Output 1050 ml  Net -370 ml   Filed Weights   02/24/23 0624 02/25/23 0459 02/26/23 0618  Weight: (P) 55.1 kg 55 kg 54.9 kg    Examination: General exam: Appears calm and comfortable, NAD Respiratory system: No work of breathing, symmetric chest wall expansion Cardiovascular system: S1 & S2 heard, RRR.  Gastrointestinal system: Abdomen is nondistended, soft and nontender.  Neuro: Alert and oriented. No focal neurological deficits. Extremities: Symmetric, expected ROM Skin: No rashes, lesions Psychiatry: Demonstrates appropriate judgement  and insight. Mood & affect appropriate for situation.   Assessment & Plan:  Principal Problem:   Acute on chronic diastolic CHF (congestive heart failure) (HCC) Active Problems:   Essential hypertension   CKD (chronic kidney disease), stage III (HCC)  Acute on chronic heart failure exacerbation - Initial BNP elevation at 1358, initially started on IV Lasix which was held due to worsening renal function.  Eventually was restarted and transitioned to p.o. - Echocardiogram: preserved LVEF but severe mitral stenosis - Cardiology has now signed off - Continue current dose of Lasix - Continue to wean oxygen as tolerated  Severe mitral stenosis - Not a candidate for intervention as per cardiology - Diuresis as above - Start metoprolol to allow for greater diastolic filling.  Titrate as blood pressure allows  Acute hypoxemic respiratory failure - Thought to be secondary to volume overload - Still requiring 6L O2, continue to wean as tolerated.  Discharge to SNF once O2 is consistently < 4L - Status posttreatment for acute bronchitis with Zithromax - Continue DuoNeb treatments as needed  AKI on CKD stage IIIb - Continue to follow BMP - Avoid nephrotoxic meds  Hypertension - Continue current meds  History of [name] - Stable on repeat echo  Coronary artery disease - Continue aspirin and Zetia  History of LV aneurysm - Due to prior [name] - Cardiology aware, continue medical management  Diabetes type 2 - Continue sliding scale, titrate up as  tolerated  Hypokalemia - Replace as needed  Anemia of chronic disease - Baseline hemoglobin appears to be 10-12 - Currently stable - Transfuse if possible 7  Goals of care - Palliative care has been consulted - Patient currently DNR - Planning to discharge to SNF with rehab, outpatient follow-up with palliative care.  Patient to consider transitioning to hospice when at home  BMI 24 - Outpatient follow up for lifestyle modification  and risk factor management    DVT prophylaxis: lovenox Code Status: DNR Family Communication: None at bedside, discussed directly with pt Disposition:  Status is: Inpatient Remains inpatient appropriate because: she is pending O2 weaning to be SNF eligible.     Consultants:  Cards  Procedures:  none  Antimicrobials:  Anti-infectives (From admission, onward)    Start     Dose/Rate Route Frequency Ordered Stop   02/17/23 1700  azithromycin (ZITHROMAX) tablet 500 mg        500 mg Oral Daily 02/17/23 1031 02/18/23 1557   02/16/23 1630  azithromycin (ZITHROMAX) 500 mg in sodium chloride 0.9 % 250 mL IVPB  Status:  Discontinued        500 mg 255 mL/hr over 60 Minutes Intravenous Every 24 hours 02/16/23 1539 02/17/23 1031   02/16/23 1615  cefTRIAXone (ROCEPHIN) 1 g in sodium chloride 0.9 % 100 mL IVPB  Status:  Discontinued        1 g 200 mL/hr over 30 Minutes Intravenous Every 24 hours 02/16/23 1529 02/17/23 1030   02/16/23 1615  azithromycin (ZITHROMAX) 500 mg in dextrose 5 % 250 mL IVPB  Status:  Discontinued        500 mg 250 mL/hr over 60 Minutes Intravenous Every 24 hours 02/16/23 1529 02/16/23 1539       Data Reviewed: I have personally reviewed following labs and imaging studies CBC: Recent Labs  Lab 02/19/23 0837  WBC 6.3  NEUTROABS 4.5  HGB 10.4*  HCT 34.2*  MCV 96.6  PLT 261   Basic Metabolic Panel: Recent Labs  Lab 02/21/23 1513 02/21/23 2012 02/22/23 0206 02/23/23 0715 02/24/23 0206 02/25/23 0210  NA 143  --  142 143 142 143  K >7.5* 4.8 4.7 4.7 4.5 4.3  CL 108  --  104 102 105 104  CO2 22  --  28 30 29 30   GLUCOSE 111*  --  111* 93 105* 108*  BUN 61*  --  56* 57* 64* 65*  CREATININE 1.91*  --  1.84* 2.17* 2.07* 1.95*  CALCIUM 9.0  --  8.8* 9.0 8.6* 8.6*   GFR: Estimated Creatinine Clearance: 12.5 mL/min (A) (by C-G formula based on SCr of 1.95 mg/dL (H)). Liver Function Tests: No results for input(s): "AST", "ALT", "ALKPHOS", "BILITOT",  "PROT", "ALBUMIN" in the last 168 hours. CBG: Recent Labs  Lab 02/25/23 0613 02/25/23 1133 02/25/23 1603 02/25/23 2102 02/26/23 0608  GLUCAP 136* 147* 129* 154* 93    Recent Results (from the past 240 hour(s))  MRSA Next Gen by PCR, Nasal     Status: None   Collection Time: 02/16/23  2:30 PM   Specimen: Nasal Mucosa; Nasal Swab  Result Value Ref Range Status   MRSA by PCR Next Gen NOT DETECTED NOT DETECTED Final    Comment: (NOTE) The GeneXpert MRSA Assay (FDA approved for NASAL specimens only), is one component of a comprehensive MRSA colonization surveillance program. It is not intended to diagnose MRSA infection nor to guide or monitor treatment for MRSA infections. Test performance  is not FDA approved in patients less than 29 years old. Performed at [facility], [address] [place], [place] [address]   Culture, blood (Routine X 2) w Reflex to ID Panel     Status: None   Collection Time: 02/16/23  4:22 PM   Specimen: BLOOD RIGHT ARM  Result Value Ref Range Status   Specimen Description BLOOD RIGHT ARM  Final   Special Requests   Final    BOTTLES DRAWN AEROBIC AND ANAEROBIC Blood Culture adequate volume   Culture   Final    NO GROWTH 5 DAYS Performed at [facility], [address] [place], [place] [address]    Report Status 02/21/2023 FINAL  Final  Culture, blood (Routine X 2) w Reflex to ID Panel     Status: None   Collection Time: 02/16/23  4:27 PM   Specimen: BLOOD RIGHT HAND  Result Value Ref Range Status   Specimen Description BLOOD RIGHT HAND  Final   Special Requests   Final    BOTTLES DRAWN AEROBIC AND ANAEROBIC Blood Culture adequate volume   Culture   Final    NO GROWTH 5 DAYS Performed at [facility], [address] [place], [place] [address]    Report Status 02/21/2023 FINAL  Final  Respiratory (~20 pathogens) panel by PCR     Status: None   Collection Time: 02/17/23  9:27 AM   Specimen: Nasopharyngeal Swab; Respiratory   Result Value Ref Range Status   Adenovirus NOT DETECTED NOT DETECTED Final   Coronavirus 229E NOT DETECTED NOT DETECTED Final    Comment: (NOTE) The Coronavirus on the Respiratory Panel, DOES NOT test for the novel  Coronavirus (2019 nCoV)    Coronavirus HKU1 NOT DETECTED NOT DETECTED Final   Coronavirus NL63 NOT DETECTED NOT DETECTED Final   Coronavirus OC43 NOT DETECTED NOT DETECTED Final   Metapneumovirus NOT DETECTED NOT DETECTED Final   Rhinovirus / Enterovirus NOT DETECTED NOT DETECTED Final   Influenza A NOT DETECTED NOT DETECTED Final   Influenza B NOT DETECTED NOT DETECTED Final   Parainfluenza Virus 1 NOT DETECTED NOT DETECTED Final   Parainfluenza Virus 2 NOT DETECTED NOT DETECTED Final   Parainfluenza Virus 3 NOT DETECTED NOT DETECTED Final   Parainfluenza Virus 4 NOT DETECTED NOT DETECTED Final   Respiratory Syncytial Virus NOT DETECTED NOT DETECTED Final   Bordetella pertussis NOT DETECTED NOT DETECTED Final   Bordetella Parapertussis NOT DETECTED NOT DETECTED Final   Chlamydophila pneumoniae NOT DETECTED NOT DETECTED Final   Mycoplasma pneumoniae NOT DETECTED NOT DETECTED Final    Comment: Performed at [facility], [address], [place], [place] [address]     Radiology Studies: No results found.  Scheduled Meds:  aspirin EC  81 mg Oral Daily   enoxaparin (LOVENOX) injection  30 mg Subcutaneous Q24H   ezetimibe  10 mg Oral Daily   insulin aspart  0-9 Units Subcutaneous TID WC   metoprolol tartrate  12.5 mg Oral BID   torsemide  20 mg Oral Daily   Continuous Infusions:   LOS: 10 days    Time spent:   Debarah Crape, DO [facility]  To contact the attending physician between 7A-7P please use Epic Chat. To contact the covering physician during after hours 7P-7A, please review Amion.   02/26/2023, 7:42 AM

## 2023-02-26 NOTE — Progress Notes (Signed)
Physical Therapy Treatment Patient Details Name: Sara Garza MRN: [id number] DOB: 1923-11-06 Today's Date: 02/26/2023   History of Present Illness Pt is 89 year old presented to [facility] ED on 02/15/23 for worsening SOB and BLE edema. Transferred to [facility] on  02/16/23. Pt with acute on chronic diastolic CHF. Pt also with severe mitral stenosis. PMH - CAD, CKD, DM, HTN, CHF, [name], Bil THR    PT Comments  Pt is slowly progressing towards goals. HR improved no higher than to 118 bpm during functional activities. Pt performed sit to stand continuing to fluctuate between Min A and CGA . Pt requires seated rest break during gait due to fatigue and respiratory status.  Due to pt current functional status, home set up and available assistance at home recommending skilled physical therapy services < 3 hours/day on discharge from acute care hospital setting in order to decrease risk for falls, injury and re-hospitalization. Pt tolerated treatment session well on 6L O2 via [place]    If plan is discharge home, recommend the following: Assistance with cooking/housework;Assist for transportation;Help with stairs or ramp for entrance;A little help with walking and/or transfers   Can travel by private vehicle     No  Equipment Recommendations  Wheelchair (measurements PT);Wheelchair cushion (measurements PT)       Precautions / Restrictions Precautions Precautions: Fall Precaution Comments: watch SpO2 and HR Restrictions Weight Bearing Restrictions: No     Mobility  Bed Mobility Overal bed mobility: Needs Assistance General bed mobility comments: Min A with LE    Transfers Overall transfer level: Needs assistance Equipment used: Rolling walker (2 wheels) Transfers: Sit to/from Stand Sit to Stand: Contact guard assist, Min assist           General transfer comment: CGA 1x and Min A 1x with good hand placement.    Ambulation/Gait Ambulation/Gait assistance: Contact guard assist Gait  Distance (Feet): 30 Feet (+15) Assistive device: Rolling walker (2 wheels) Gait Pattern/deviations: Step-through pattern, Decreased step length - right, Decreased step length - left, Narrow base of support Gait velocity: decreased Gait velocity interpretation: <1.8 ft/sec, indicate of risk for recurrent falls   General Gait Details: after 15 ft pt required sitting rest break. with O2 sats at 76% immediately increasing to 97% on sitting. Poor pleth line with ambulation. Pt was short of breathe. Pt short of breathe after short 30 ft walk with CGA and 74% O2 sats immediately up to 96% after sitting.      Balance Overall balance assessment: Needs assistance Sitting-balance support: No upper extremity supported, Feet supported Sitting balance-Leahy Scale: Good Sitting balance - Comments: statically   Standing balance support: Bilateral upper extremity supported, During functional activity, Reliant on assistive device for balance Standing balance-Leahy Scale: Fair Standing balance comment: pt fearful of falling, close guarding given for increased confidence        Cognition Arousal: Alert Behavior During Therapy: WFL for tasks assessed/performed Overall Cognitive Status: Within Functional Limits for tasks assessed             General Comments General comments (skin integrity, edema, etc.): 6L O2 via [place]. Desat to 70's wtih poor pleth line during gait. Increased back up to 90's immediatly upon sitting. Pt was cued to loosen grip on RW for better reading but pt was anxious and unable      Pertinent Vitals/Pain Pain Assessment Pain Assessment: No/denies pain     PT Goals (current goals can now be found in the care plan section) Acute Rehab  PT Goals Patient Stated Goal: return home PT Goal Formulation: With patient Time For Goal Achievement: 03/03/23 Potential to Achieve Goals: Fair Progress towards PT goals: Progressing toward goals    Frequency    Min 1X/week      PT  Plan  Continue with current POC       AM-PAC PT "6 Clicks" Mobility   Outcome Measure  Help needed turning from your back to your side while in a flat bed without using bedrails?: A Little Help needed moving from lying on your back to sitting on the side of a flat bed without using bedrails?: A Little Help needed moving to and from a bed to a chair (including a wheelchair)?: A Little Help needed standing up from a chair using your arms (e.g., wheelchair or bedside chair)?: A Little Help needed to walk in hospital room?: A Little Help needed climbing 3-5 steps with a railing? : A Lot 6 Click Score: 17    End of Session Equipment Utilized During Treatment: Oxygen;Gait belt Activity Tolerance: Patient limited by fatigue;Patient tolerated treatment well Patient left: with call bell/phone within reach;with family/visitor present;in bed Nurse Communication: Mobility status PT Visit Diagnosis: Unsteadiness on feet (R26.81);Other abnormalities of gait and mobility (R26.89);Muscle weakness (generalized) (M62.81)     Time: 5284-1324 PT Time Calculation (min) (ACUTE ONLY): 24 min  Charges:    $Therapeutic Activity: 23-37 mins PT General Charges $$ ACUTE PT VISIT: 1 Visit                     Harrel Carina, DPT, CLT  Acute Rehabilitation Services Office: [phone] (Secure chat preferred)    Claudia Desanctis 02/26/2023, 3:46 PM

## 2023-02-26 NOTE — Progress Notes (Signed)
Rounding Note    Patient Name: Sara Garza Date of Encounter: 02/26/2023  [facility] Cardiologist: Tonny Bollman, MD   Subjective   No CP or dyspnea  Inpatient Medications    Scheduled Meds:  aspirin EC  81 mg Oral Daily   enoxaparin (LOVENOX) injection  30 mg Subcutaneous Q24H   ezetimibe  10 mg Oral Daily   insulin aspart  0-9 Units Subcutaneous TID WC   metoprolol tartrate  12.5 mg Oral BID   torsemide  20 mg Oral Daily   Continuous Infusions:  PRN Meds: acetaminophen **OR** acetaminophen, hydrOXYzine, polyethylene glycol   Vital Signs    Vitals:   02/25/23 2114 02/25/23 2258 02/26/23 0611 02/26/23 0618  BP: (!) 107/51 (!) 101/47 (!) 120/57   Pulse: 96 69 83   Resp:  20 20   Temp:  97.8 F (36.6 C) 97.6 F (36.4 C)   TempSrc:  Oral Oral   SpO2:  93% 95%   Weight:    54.9 kg  Height:        Intake/Output Summary (Last 24 hours) at 02/26/2023 0703 Last data filed at 02/26/2023 4098 Gross per 24 hour  Intake 680 ml  Output 1050 ml  Net -370 ml      02/26/2023    6:18 AM 02/25/2023    4:59 AM 02/24/2023    6:24 AM  Last 3 Weights  Weight (lbs) 121 lb 0.5 oz 121 lb 4.1 oz 121 lb 7.6 oz  Weight (kg) 54.9 kg 55 kg 55.1 kg      Telemetry    Sinus with pacs- Personally Reviewed   Physical Exam   GEN: NAD, frail Neck: supple Cardiac: RRR, no gallop Respiratory: CTA; no wheeze GI: Soft, NT/ND, no masses MS: trace edema Neuro: No focal findings Psych: Normal affect   Labs    High Sensitivity Troponin:   Recent Labs  Lab 02/15/23 1729 02/15/23 1916  TROPONINIHS 46* 39*     Chemistry Recent Labs  Lab 02/23/23 0715 02/24/23 0206 02/25/23 0210  NA 143 142 143  K 4.7 4.5 4.3  CL 102 105 104  CO2 30 29 30   GLUCOSE 93 105* 108*  BUN 57* 64* 65*  CREATININE 2.17* 2.07* 1.95*  CALCIUM 9.0 8.6* 8.6*  GFRNONAA 20* 21* 23*  ANIONGAP 11 8 9      Hematology Recent Labs  Lab 02/19/23 0837  WBC 6.3  RBC 3.54*   3.47*  HGB 10.4*  HCT 34.2*  MCV 96.6  MCH 29.4  MCHC 30.4  RDW 12.8  PLT 261    BNP Recent Labs  Lab 02/20/23 1057  BNP 1,358.0*    Patient Profile     89 y.o. female with past medical history of heart failure preserved EF, coronary artery disease, chronic kidney disease, diabetes mellitus, hypertension, history of [name], mitral stenosis for evaluation of acute on chronic heart failure preserved ejection fraction.  Most recent echocardiogram November 2024 showed normal LV function with aneurysmal mid ventricular septum, moderate left ventricular hypertrophy, severe left atrial enlargement, severe mitral stenosis with mean gradient 14 mmHg, mild to moderate mitral regurgitation, status post aortic valve replacement with mild aortic insufficiency and mean gradient 18.5 mmHg.  Assessment & Plan    1 acute on chronic heart failure preserved ejection fraction-appears euvolemic today on examination.  Will continue Demadex at present dose.    2 valvular heart disease-patient has severe mitral stenosis and history of [name].  Given her age she is  not a candidate for further interventions.  Plan will be medical therapy.  Continue SBE prophylaxis.  3 history of coronary artery disease-continue aspirin and Zetia.  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  5 hypertension-blood pressure is controlled.  Continue present medical regimen.  6 acute on chronic stage IIIb kidney disease-creatinine pending this morning.  7 no CODE BLUE  Cardiology will sign off.  Would continue present dose of Demadex at discharge.  Check potassium and renal function 1 week following discharge.  Will arrange follow-up with APP 2 to 4 weeks after discharge.  For questions or updates, please contact [facility] Please consult [email or web address] for contact info under        Signed, Olga Millers, MD  02/26/2023, 7:03 AM

## 2023-02-26 NOTE — Progress Notes (Signed)
02/26/2023  0500 Pt refused to get out of bed this am for a standing weight.  Bed weight was obtained. Kathryne Hitch

## 2023-02-26 NOTE — Progress Notes (Signed)
Mobility Specialist Progress Note:   02/26/23 1000  Mobility  Activity Transferred from bed to chair  Level of Assistance Minimal assist, patient does 75% or more  Assistive Device Front wheel walker  Distance Ambulated (ft) 4 ft  Activity Response Tolerated well  Mobility Referral Yes  $Mobility charge 1 Mobility  Mobility Specialist Start Time (ACUTE ONLY) 0941  Mobility Specialist Stop Time (ACUTE ONLY) 0955  Mobility Specialist Time Calculation (min) (ACUTE ONLY) 14 min    Pt received in bed, agreeable to mobility. MinA to get EOB and stand to pivot to chair. No complains throughout. Pt left in chair with call bell and all needs met.  [place] Earlene Plater Mobility Specialist Please contact via Special educational needs teacher or Rehab office at [phone]

## 2023-02-26 NOTE — TOC Progression Note (Addendum)
Transition of Care [facility]) - Progression Note    Patient Details  Name: Sara Garza MRN: [id number] Date of Birth: Apr 23, 1923  [facility]) CM/SW Contact  Marliss Coots, LCSW Phone Number: 02/26/2023, 11:16 AM  Clinical Narrative:     11:16 AM Patient's daughter, Sayumi Dusseault called this CSW and left a voicemail asking for a follow up on SNF. CSW returned Ms. Colin Broach phone call to inform her of [facility] SNF requirement of oxygen at 4L or lower. Ms. [name] expressed interest in closer monitoring of respiratory therapy and/or palliative care if possible to assist in weaning patient to 4L for SNF. CSW offered to reiterate this information to patient and medical team, which Ms. [name] accepted.  11:45 AM This CSW spoke with patient at bedside of SNF 4L oxygen requirement. Patient expressed understanding of this information.  3:45 PM [place] with SNF [facility] informed this CSW that they do not have a bed tomorrow or Friday but would follow up once notified of next bed availability date.  Expected Discharge Plan: Skilled Nursing Facility Barriers to Discharge: Continued Medical Work up, SNF Pending discharge orders, SNF Pending transportation, SNF Pending discharge summary, English as a second language teacher  Expected Discharge Plan and Services In-Corpening Referral: Clinical Social Work Discharge Planning Services: CM Consult Post Acute Care Choice: Skilled Nursing Facility Living arrangements for the past 2 months: Single Family Home                                       Social Determinants of Health (SDOH) Interventions SDOH Screenings   Food Insecurity: No Food Insecurity (02/16/2023)  Housing: Low Risk  (02/16/2023)  Transportation Needs: No Transportation Needs (02/16/2023)  Utilities: Not At Risk (02/16/2023)  Alcohol Screen: Low Risk  (05/22/2022)  Depression (PHQ2-9): Low Risk  (05/22/2022)  Financial Resource Strain: Low Risk  (05/22/2022)  Physical Activity:  Inactive (05/22/2022)  Social Connections: Moderately Integrated (05/22/2022)  Stress: No Stress Concern Present (05/22/2022)  Tobacco Use: Medium Risk (02/15/2023)    Readmission Risk Interventions     No data to display

## 2023-02-27 DIAGNOSIS — I5033 Acute on chronic diastolic (congestive) heart failure: Secondary | ICD-10-CM | POA: Diagnosis not present

## 2023-02-27 DIAGNOSIS — I2510 Atherosclerotic heart disease of native coronary artery without angina pectoris: Secondary | ICD-10-CM | POA: Diagnosis not present

## 2023-02-27 DIAGNOSIS — E785 Hyperlipidemia, unspecified: Secondary | ICD-10-CM

## 2023-02-27 DIAGNOSIS — N184 Chronic kidney disease, stage 4 (severe): Secondary | ICD-10-CM

## 2023-02-27 DIAGNOSIS — E1169 Type 2 diabetes mellitus with other specified complication: Secondary | ICD-10-CM

## 2023-02-27 DIAGNOSIS — I10 Essential (primary) hypertension: Secondary | ICD-10-CM | POA: Diagnosis not present

## 2023-02-27 LAB — COMPREHENSIVE METABOLIC PANEL
ALT: 13 U/L (ref 0–44)
AST: 16 U/L (ref 15–41)
Albumin: 2.4 g/dL — ABNORMAL LOW (ref 3.5–5.0)
Alkaline Phosphatase: 45 U/L (ref 38–126)
Anion gap: 9 (ref 5–15)
BUN: 63 mg/dL — ABNORMAL HIGH (ref 8–23)
CO2: 32 mmol/L (ref 22–32)
Calcium: 8.6 mg/dL — ABNORMAL LOW (ref 8.9–10.3)
Chloride: 99 mmol/L (ref 98–111)
Creatinine, Ser: 2.11 mg/dL — ABNORMAL HIGH (ref 0.44–1.00)
GFR, Estimated: 21 mL/min — ABNORMAL LOW (ref >60)
Glucose, Bld: 94 mg/dL (ref 70–99)
Potassium: 4.2 mmol/L (ref 3.5–5.1)
Sodium: 140 mmol/L (ref 135–145)
Total Bilirubin: 0.6 mg/dL (ref <1.2)
Total Protein: 5.8 g/dL — ABNORMAL LOW (ref 6.5–8.1)

## 2023-02-27 LAB — CBC WITH DIFFERENTIAL/PLATELET
Abs Immature Granulocytes: 0.02 10*3/uL (ref 0.00–0.07)
Basophils Absolute: 0 10*3/uL (ref 0.0–0.1)
Basophils Relative: 1 %
Eosinophils Absolute: 0.3 10*3/uL (ref 0.0–0.5)
Eosinophils Relative: 5 %
HCT: 28.2 % — ABNORMAL LOW (ref 36.0–46.0)
Hemoglobin: 8.7 g/dL — ABNORMAL LOW (ref 12.0–15.0)
Immature Granulocytes: 0 %
Lymphocytes Relative: 12 %
Lymphs Abs: 0.7 10*3/uL (ref 0.7–4.0)
MCH: 29.8 pg (ref 26.0–34.0)
MCHC: 30.9 g/dL (ref 30.0–36.0)
MCV: 96.6 fL (ref 80.0–100.0)
Monocytes Absolute: 0.7 10*3/uL (ref 0.1–1.0)
Monocytes Relative: 13 %
Neutro Abs: 3.9 10*3/uL (ref 1.7–7.7)
Neutrophils Relative %: 69 %
Platelets: 203 10*3/uL (ref 150–400)
RBC: 2.92 MIL/uL — ABNORMAL LOW (ref 3.87–5.11)
RDW: 12.3 % (ref 11.5–15.5)
WBC: 5.6 10*3/uL (ref 4.0–10.5)
nRBC: 0 % (ref 0.0–0.2)

## 2023-02-27 LAB — GLUCOSE, CAPILLARY
Glucose-Capillary: 95 mg/dL (ref 70–99)
Glucose-Capillary: 98 mg/dL (ref 70–99)
Glucose-Capillary: 191 mg/dL — ABNORMAL HIGH (ref 70–99)
Glucose-Capillary: 106 mg/dL — ABNORMAL HIGH (ref 70–99)

## 2023-02-27 LAB — PHOSPHORUS: Phosphorus: 4.2 mg/dL (ref 2.5–4.6)

## 2023-02-27 LAB — MAGNESIUM: Magnesium: 2.5 mg/dL — ABNORMAL HIGH (ref 1.7–2.4)

## 2023-02-27 NOTE — Assessment & Plan Note (Addendum)
Echocardiogram with preserved LV systolic function with EF 50 to 55%, moderate left ventricular hypertrophy, RV systolic function preserved, RVPS 43,8 mmHg, LA with severe dilatation, mild to moderate mitral valve regurgitation, severe mitral valve stenosis, aortic valve sp [name] with stable function. 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  Trivial pericardial effusion.   Acute on chronic core pulmonale.   Patient was placed on furosemide for diuresis, negative fluid balance was achieved, -5,718 ml, with significant improvement in her symptoms.   Plan to continue torsemide 20 mg daily and metoprolol.  Limited guideline directed medical therapy due to low GFR and risk of hypotension.  She is not candidate for valve intervention.

## 2023-02-27 NOTE — Assessment & Plan Note (Addendum)
Continue glucose cover and monitoring with insulin sliding scale.  Fasting glucose today 138 mg/dl.  Continue with ezetimibe.

## 2023-02-27 NOTE — Hospital Course (Addendum)
Mrs. Dineen was admitted to the hospital with the working diagnosis of heart failure exacerbation.   89 yo female with the past medical history of heart failure, coronary artery disease, CKD, T2DM, sp [name] and hypertension who presented with dyspnea. She heart failure stage III, per symptoms and uses a walker for ambulation. Reported 4 days of worsening dyspnea, having symptoms at rest. On her initial physical examination her blood pressure was 133/61, HR 101, RR 24 and 02 saturation 81%, lungs with bilateral rales, no wheezing, heart with S1 and S2 present and regular, with systolic murmur at the base, abdomen with no distention and positive lower extremity edema.   Na 141, K 3,6 Cl 104, bicarbonate 25, glucose 100, bun 46 cr 1,81  BNP 1,485  High sensitive troponin 46 and 39  Wbc 6.0 hgb 10,8 plt 250  Sars covid 19 negative   Chest radiograph with hypoinflation, bilateral hilar vascular congestion with bilateral central interstitial infiltrates, bilateral small pleural effusions.   EKG 87 bpm, right axis deviation, left bundle branch block, qtc 544, sinus rhythm with q waves lead I, aVL, V1 to V3, with no significant ST segment changes, no T wave changes.   Patient was placed on IV furosemide for diuresis.  Patient has had difficulty weaning from oxygen. Palliative care was consulted for goals of care.  Currently the plan is to discharge to skilled nursing facility with palliative care as follow-up.   11/29 patient is medically stable, pending transfer to SNF on 03/02/23  11/30 plan for transfer to SNF tomorrow.

## 2023-02-27 NOTE — Progress Notes (Signed)
  Progress Note   Patient: Sara Garza ZOX:[id number] DOB: May 27, 1923 DOA: 02/15/2023     11 DOS: the patient was seen and examined on 02/27/2023   Brief hospital course: Sara Garza was admitted to the hospital with the working diagnosis of heart failure exacerbation.   89 yo female with the past medical history of heart failure, coronary artery disease, CKD, T2DM, sp [name] and hypertension who presented with dyspnea. She heart failure stage III, per symptoms and uses a walker for ambulation. Reported 4 days of worsening dyspnea, having symptoms at rest. On her initial physical examination her blood pressure was 133/61, HR 101, RR 24 and 02 saturation 81%, lungs with bilateral rales, no wheezing, heart with S1 and S2 present and regular, with systolic murmur at the base, abdomen with no distention and positive lower extremity edema.   Patient has been diuresed with IV Lasix and is now been switched to p.o. Patient has had difficulty weaning from oxygen. Palliative care was consulted for goals of care. Currently the plan is to discharge to skilled nursing facility with palliative care as follow-up.   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  Trivial pericardial effusion.   Acute on chronic core pulmonale.   Documented urine output is 600 cc, since admission volume status is negative -4,485 ml Systolic blood pressure 118 to 129 mmHg.   Plan to continue torsemide 20 mg daily and metoprolol.  Limited guideline directed medical therapy due to low GFR and risk of hypotension.   Essential  hypertension Continue blood pressure control with metoprolol.   CKD (chronic kidney disease) stage 4, GFR 15-29 ml/min (HCC) Renal function with serum cr at 2.11 with K at 4,2 and serum bicarbonate at 32.  Na 140 Mg 2.5   Plant to continue diuresis with oral torsemide Follow up renal function in am.   CAD (coronary artery disease) No chest pain or acute coronary syndrome.   Type 2 diabetes mellitus with hyperlipidemia (HCC) Continue glucose cover and monitoring with insulin sliding scale.  Continue with ezetimibe.         Subjective: Patient with no chest pain or dyspnea, lower extremity edema has improved.   Physical Exam: Vitals:   02/27/23 0500 02/27/23 0730 02/27/23 0919 02/27/23 1104  BP:  (!) 129/54 (!) 129/54   Pulse: 70  74   Resp: 20     Temp:  98 F (36.7 C)  98 F (36.7 C)  TempSrc:  Oral  Oral  SpO2: 90%     Weight: 58.9 kg     Height:       Neurology awake and alert ENT with mild pallor Cardiovascular with S1 and S2 present and regular with no gallops, rubs or murmurs No JVS No lower extremity edema Respiratory with no rales or wheezing,  Abdomen with no distention  Data Reviewed:    Family Communication: no family at the bedside   Disposition: Status is: Inpatient Remains inpatient appropriate because: pending placement SNF   Planned Discharge Destination: Skilled nursing facility     Author: Coralie Keens, MD 02/27/2023 3:35 PM  For on call review [email or web address].

## 2023-02-27 NOTE — Assessment & Plan Note (Signed)
No chest pain or acute coronary syndrome.

## 2023-02-27 NOTE — Progress Notes (Signed)
Mobility Specialist Progress Note:   02/27/23 1104  Therapy Vitals  Temp 98 F (36.7 C)  Temp Source Oral  Patient Position (if appropriate) Sitting  Oxygen Therapy  O2 Device Nasal Cannula  O2 Flow Rate (L/min) 2 L/min  Mobility  Activity Ambulated with assistance in room  Level of Assistance Minimal assist, patient does 75% or more  Assistive Device Front wheel walker  Distance Ambulated (ft) 30 ft (15+15)  Activity Response Tolerated well  Mobility Referral Yes  $Mobility charge 1 Mobility  Mobility Specialist Start Time (ACUTE ONLY) 1018  Mobility Specialist Stop Time (ACUTE ONLY) 1041  Mobility Specialist Time Calculation (min) (ACUTE ONLY) 23 min    Pt received in chair, agreeable to mobility. Able to stand w/ minA and ambulate w/ contact guard. Able to ambulate around bed and take x1 seated rest break before ambulating around bed back to chair. Asymptomatic w/ no complaints throughout. Pt left in chair with call bell and all needs met.  D'Vante Earlene Plater Mobility Specialist Please contact via Special educational needs teacher or Rehab office at [phone]

## 2023-02-28 DIAGNOSIS — I5033 Acute on chronic diastolic (congestive) heart failure: Secondary | ICD-10-CM | POA: Diagnosis not present

## 2023-02-28 DIAGNOSIS — I2510 Atherosclerotic heart disease of native coronary artery without angina pectoris: Secondary | ICD-10-CM | POA: Diagnosis not present

## 2023-02-28 DIAGNOSIS — I10 Essential (primary) hypertension: Secondary | ICD-10-CM | POA: Diagnosis not present

## 2023-02-28 DIAGNOSIS — N184 Chronic kidney disease, stage 4 (severe): Secondary | ICD-10-CM | POA: Diagnosis not present

## 2023-02-28 LAB — GLUCOSE, CAPILLARY
Glucose-Capillary: 86 mg/dL (ref 70–99)
Glucose-Capillary: 140 mg/dL — ABNORMAL HIGH (ref 70–99)
Glucose-Capillary: 161 mg/dL — ABNORMAL HIGH (ref 70–99)
Glucose-Capillary: 62 mg/dL — ABNORMAL LOW (ref 70–99)
Glucose-Capillary: 115 mg/dL — ABNORMAL HIGH (ref 70–99)

## 2023-02-28 LAB — RENAL FUNCTION PANEL
Albumin: 2.7 g/dL — ABNORMAL LOW (ref 3.5–5.0)
Anion gap: 10 (ref 5–15)
BUN: 62 mg/dL — ABNORMAL HIGH (ref 8–23)
CO2: 32 mmol/L (ref 22–32)
Calcium: 8.8 mg/dL — ABNORMAL LOW (ref 8.9–10.3)
Chloride: 100 mmol/L (ref 98–111)
Creatinine, Ser: 1.93 mg/dL — ABNORMAL HIGH (ref 0.44–1.00)
GFR, Estimated: 23 mL/min — ABNORMAL LOW (ref >60)
Glucose, Bld: 138 mg/dL — ABNORMAL HIGH (ref 70–99)
Phosphorus: 4.4 mg/dL (ref 2.5–4.6)
Potassium: 4.2 mmol/L (ref 3.5–5.1)
Sodium: 142 mmol/L (ref 135–145)

## 2023-02-28 NOTE — Progress Notes (Signed)
Progress Note   Patient: Sara Garza KZS:[id number] DOB: June 12, 1923 DOA: 02/15/2023     12 DOS: the patient was seen and examined on 02/28/2023   Brief hospital course: Mrs. Hardimon was admitted to the hospital with the working diagnosis of heart failure exacerbation.   89 yo female with the past medical history of heart failure, coronary artery disease, CKD, T2DM, sp [name] and hypertension who presented with dyspnea. She heart failure stage III, per symptoms and uses a walker for ambulation. Reported 4 days of worsening dyspnea, having symptoms at rest. On her initial physical examination her blood pressure was 133/61, HR 101, RR 24 and 02 saturation 81%, lungs with bilateral rales, no wheezing, heart with S1 and S2 present and regular, with systolic murmur at the base, abdomen with no distention and positive lower extremity edema.   Chest radiograph with hypoinflation, bilateral hilar vascular congestion with bilateral central interstitial infiltrates, bilateral small pleural effusions.   EKG 87 bpm, right axis deviation, left bundle branch block, qtc 544, sinus rhythm with q waves lead I, aVL, V1 to V3, with no significant ST segment changes, no T wave changes.   Patient was placed on IV furosemide for diuresis.  Patient has had difficulty weaning from oxygen. Palliative care was consulted for goals of care.  Currently the plan is to discharge to skilled nursing facility with palliative care as follow-up.   11/29 patient is medically stable, pending transfer to SNF on 03/02/23   Assessment and Plan: * Acute on chronic diastolic CHF (congestive heart failure) (HCC) Echocardiogram with preserved LV systolic function with EF 50 to 55%, moderate left ventricular hypertrophy, RV systolic function preserved, RVPS 43,8 mmHg, LA with severe dilatation, mild to moderate mitral valve regurgitation, severe mitral valve stenosis, aortic valve sp [name] with stable function. LV with mid to anteroseptal  segment is aneurysmal.  Trivial pericardial effusion.   Acute on chronic core pulmonale.   Continue patient euvolemic.   Plan to continue torsemide 20 mg daily and metoprolol.  Limited guideline directed medical therapy due to low GFR and risk of hypotension.  She is not candidate for valve intervention.   Essential hypertension Continue blood pressure control with metoprolol.   CKD (chronic kidney disease) stage 4, GFR 15-29 ml/min (HCC) Improved volume status, renal function today with serum cr at 1,93 with K at 4,2 and serum bicarbonate at 32.  Na 142.   Plant to continue diuresis with oral torsemide Follow up renal function in 7 days as outpatient.   CAD (coronary artery disease) No chest pain or acute coronary syndrome.   Type 2 diabetes mellitus with hyperlipidemia (HCC) Continue glucose cover and monitoring with insulin sliding scale.  Fasting glucose today 138 mg/dl.  Continue with ezetimibe.         Subjective: Patient is seating in the chair with no chest pain or dyspnea, no lower extremity edema.   Physical Exam: Vitals:   02/28/23 0319 02/28/23 0321 02/28/23 0716 02/28/23 1127  BP:  (!) 116/52 99/87 106/62  Pulse:  66  78  Resp:  20  13  Temp:  97.8 F (36.6 C) 97.8 F (36.6 C) 97.8 F (36.6 C)  TempSrc:  Oral Oral Oral  SpO2:  96%  97%  Weight: 57.6 kg     Height:       Neurology awake and alert ENT with mild pallor  Cardiovascular with S1 and S2 present and regular with no gallops, or rubs. Positive diastolic murmur at the  apex No JVD No lower extremity edema Respiratory with no rales or wheezing, no rhonchi Abdomen with no distention  Data Reviewed:    Family Communication: no family at the bedside   Disposition: Status is: Inpatient Remains inpatient appropriate because: pending transfer to SNF   Planned Discharge Destination: Skilled nursing facility     Author: Coralie Keens, MD 02/28/2023 3:17 PM  For on call  review [email or web address].

## 2023-02-28 NOTE — Progress Notes (Signed)
Mobility Specialist Progress Note:   02/28/23 1000  Oxygen Therapy  O2 Device Nasal Cannula  O2 Flow Rate (L/min) 2 L/min  Mobility  Activity Ambulated with assistance in room  Level of Assistance Minimal assist, patient does 75% or more  Assistive Device Front wheel walker  Distance Ambulated (ft) 30 ft (15+15)  Activity Response Tolerated well  Mobility Referral Yes  $Mobility charge 1 Mobility  Mobility Specialist Start Time (ACUTE ONLY) [id number]  Mobility Specialist Stop Time (ACUTE ONLY) [id number]  Mobility Specialist Time Calculation (min) (ACUTE ONLY) 22 min    Pt received in chair, agreeable to mobility. Required minA to stand and minG to ambulate. VSS throughout w/ no complaints. Ambulated around bed toward door, then took x1 seated rest break. Pt then able to ambulate back around bed to chair. Pt left in chair with call bell and all needs met.  D'Vante Earlene Plater Mobility Specialist Please contact via Special educational needs teacher or Rehab office at [phone]

## 2023-02-28 NOTE — TOC Progression Note (Signed)
Transition of Care [facility]) - Progression Note    Patient Details  Name: Sara Garza MRN: [id number] Date of Birth: 1923-11-20  Transition of Care [facility]) CM/SW Contact  Marliss Coots, LCSW Phone Number: 02/28/2023, 11:19 AM  Clinical Narrative:     9:20AM Patient's daughter, Myrissa Bubier, called this CSW to follow up on SNF placement. CSW confirmed patient can discharge to [facility] Sunday via ambulance or transportation (depending on oxygen need). [name] expressed understanding of this. CSW offered to relay this information to patient, which [name] accepted.  11:15AM This CSW returned to patient's bedside to inform them of discharge to [facility] Sunday by ambulance transportation or personal vehicle (dependent on 02 and other medical needs). CSW also informed patient that daughter, Betsabe Vasconcellos, was already aware of discharge plans from call earlier in the morning. Patient expressed understanding of this information.   Expected Discharge Plan: Skilled Nursing Facility Barriers to Discharge: Continued Medical Work up, SNF Pending discharge orders, SNF Pending transportation, SNF Pending discharge summary, English as a second language teacher  Expected Discharge Plan and Services [place] Referral: Clinical Social Work Discharge Planning Services: CM Consult Post Acute Care Choice: Skilled Nursing Facility Living arrangements for the past 2 months: Single Family Home                                       Social Determinants of Health (SDOH) Interventions SDOH Screenings   Food Insecurity: No Food Insecurity (02/16/2023)  Housing: Low Risk  (02/16/2023)  Transportation Needs: No Transportation Needs (02/16/2023)  Utilities: Not At Risk (02/16/2023)  Alcohol Screen: Low Risk  (05/22/2022)  Depression (PHQ2-9): Low Risk  (05/22/2022)  Financial Resource Strain: Low Risk  (05/22/2022)  Physical Activity: Inactive (05/22/2022)  Social Connections: Moderately Integrated (05/22/2022)   Stress: No Stress Concern Present (05/22/2022)  Tobacco Use: Medium Risk (02/15/2023)    Readmission Risk Interventions     No data to display

## 2023-03-01 DIAGNOSIS — I5033 Acute on chronic diastolic (congestive) heart failure: Secondary | ICD-10-CM | POA: Diagnosis not present

## 2023-03-01 DIAGNOSIS — I10 Essential (primary) hypertension: Secondary | ICD-10-CM | POA: Diagnosis not present

## 2023-03-01 DIAGNOSIS — I2510 Atherosclerotic heart disease of native coronary artery without angina pectoris: Secondary | ICD-10-CM | POA: Diagnosis not present

## 2023-03-01 DIAGNOSIS — N184 Chronic kidney disease, stage 4 (severe): Secondary | ICD-10-CM | POA: Diagnosis not present

## 2023-03-01 LAB — GLUCOSE, CAPILLARY
Glucose-Capillary: 85 mg/dL (ref 70–99)
Glucose-Capillary: 99 mg/dL (ref 70–99)
Glucose-Capillary: 127 mg/dL — ABNORMAL HIGH (ref 70–99)
Glucose-Capillary: 135 mg/dL — ABNORMAL HIGH (ref 70–99)

## 2023-03-01 NOTE — Progress Notes (Signed)
Mobility Specialist Progress Note   03/01/23 0915  Mobility  Activity Ambulated with assistance in hallway; Dangle edge of bed  Level of Assistance Contact guard assist, steadying assist  Assistive Device Front wheel walker  Distance Ambulated (ft) 40 ft  Range of Motion/Exercises Active;All extremities  Activity Response Tolerated well   Pre Ambulation:  HR 70, SpO2 99% 2LO2 During Ambulation: HR 91, SpO2 92-96% 2LO2 Post Ambulation: HR 78, SpO2 99% 2LO2  Patient received in recliner, eager and motivated to participate. Required minimal HHA + cues for hand placement to stand from recliner. Ambulated min guard with slow steady gait. Was able to extend ambulatory distance comparable to past sessions. Needed x1 seated rest break upon returning to room. Tolerated without complaint or incident. Was left in recliner with all needs met, call bell in reach.   Sara Garza, BS EXP Mobility Specialist Please contact via [employer] or Rehab office at [phone]

## 2023-03-01 NOTE — Progress Notes (Addendum)
Progress Note   Patient: Sara Garza ZOX:[id number] DOB: [birth year] DOA: 02/15/2023     13 DOS: the patient was seen and examined on 03/01/2023   Brief hospital course: Mrs. Caliva was admitted to the hospital with the working diagnosis of heart failure exacerbation.   89 yo female with the past medical history of heart failure, coronary artery disease, CKD, T2DM, sp [name] and hypertension who presented with dyspnea. She heart failure stage III, per symptoms and uses a walker for ambulation. Reported 4 days of worsening dyspnea, having symptoms at rest. On her initial physical examination her blood pressure was 133/61, HR 101, RR 24 and 02 saturation 81%, lungs with bilateral rales, no wheezing, heart with S1 and S2 present and regular, with systolic murmur at the base, abdomen with no distention and positive lower extremity edema.   Na 141, K 3,6 Cl 104, bicarbonate 25, glucose 100, bun 46 cr 1,81  BNP 1,485  High sensitive troponin 46 and 39  Wbc 6.0 hgb 10,8 plt 250  Sars covid 19 negative   Chest radiograph with hypoinflation, bilateral hilar vascular congestion with bilateral central interstitial infiltrates, bilateral small pleural effusions.   EKG 87 bpm, right axis deviation, left bundle branch block, qtc 544, sinus rhythm with q waves lead I, aVL, V1 to V3, with no significant ST segment changes, no T wave changes.   Patient was placed on IV furosemide for diuresis.  Patient has had difficulty weaning from oxygen. Palliative care was consulted for goals of care.  Currently the plan is to discharge to skilled nursing facility with palliative care as follow-up.   11/29 patient is medically stable, pending transfer to SNF on 03/02/23  11/30 plan for transfer to SNF tomorrow.   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  Trivial pericardial effusion.   Acute on chronic core pulmonale.   Continue patient euvolemic.   Plan to continue torsemide 20 mg daily and metoprolol.  Limited guideline directed medical therapy due to low GFR and risk of hypotension.  She is not candidate for valve intervention.   Telemetry has bee sinus rhythm in the 70 range with first degree AV block.  Ok to discontinue telemetry.   Essential hypertension Continue blood pressure control with metoprolol.   CKD (chronic kidney disease) stage 4, GFR 15-29 ml/min (HCC) Improved volume status, renal function today with serum cr at 1,93 with K at 4,2 and serum bicarbonate at 32.  Na 142.   Plant to continue diuresis with oral torsemide Follow up renal function in 7 days as outpatient.   CAD (coronary artery disease) No chest pain or acute coronary syndrome.   Type 2 diabetes mellitus with hyperlipidemia (HCC) Continue glucose cover and monitoring with insulin sliding scale.   Continue with ezetimibe.         Subjective: Patient is feeling well today with no dyspnea or chest pain, no PND or orthopnea.   Physical Exam: Vitals:   02/28/23 1956 02/28/23 2321 03/01/23 0632 03/01/23 0708  BP: (!) 111/50 (!) 116/56 128/61 125/61  Pulse: 82 67 75   Resp: 15 18 14    Temp: 98.1 F (36.7 C) 97.6  F (36.4 C) (!) 97.4 F (36.3 C) 97.7 F (36.5 C)  TempSrc: Oral Oral Oral Oral  SpO2: 93% 96% 97%   Weight:   55.5 kg   Height:       Neurology awake and alert ENT with mild pallor Cardiovascular with S1 and S2 present, with mild diastolic murmur, no gallops or rubs.  Respiratory with no rales or wheezing Abdomen with no distention  Data Reviewed:    Family Communication: no family at the  bedside   Disposition: Status is: Inpatient Remains inpatient appropriate because: pending transfer to SNF likely tomorrow.   Planned Discharge Destination: Skilled nursing facility   Author: Coralie Keens, MD 03/01/2023 8:26 AM  For on call review [email or web address].

## 2023-03-01 NOTE — TOC Progression Note (Addendum)
Transition of Care [facility]) - Progression Note    Patient Details  Name: Sara Garza MRN: [id number] Date of Birth: 06-Aug-1923  Transition of Care [facility]) CM/SW Contact  Patrice Paradise, LCSW Phone Number: 03/01/2023, 10:20 AM  Clinical Narrative:     CSW attempted to call admission staff at [facility] to confirm pt's DC tomorrow. CSW awaiting a call back.  1:30PM- CSW called admission number back and spoke with [facility] and she stated that [place] should be back tomorrow therefore follow up with her in the morning to confirm the DC.  TOC team will continue to assist with discharge planning needs.   Expected Discharge Plan: Skilled Nursing Facility Barriers to Discharge: Continued Medical Work up, SNF Pending discharge orders, SNF Pending transportation, SNF Pending discharge summary, English as a second language teacher  Expected Discharge Plan and Services In-[place] Referral: Clinical Social Work Discharge Planning Services: CM Consult Post Acute Care Choice: Skilled Nursing Facility Living arrangements for the past 2 months: Single Family Home                                       Social Determinants of Health (SDOH) Interventions SDOH Screenings   Food Insecurity: No Food Insecurity (02/16/2023)  Housing: Low Risk  (02/16/2023)  Transportation Needs: No Transportation Needs (02/16/2023)  Utilities: Not At Risk (02/16/2023)  Alcohol Screen: Low Risk  (05/22/2022)  Depression (PHQ2-9): Low Risk  (05/22/2022)  Financial Resource Strain: Low Risk  (05/22/2022)  Physical Activity: Inactive (05/22/2022)  Social Connections: Moderately Integrated (05/22/2022)  Stress: No Stress Concern Present (05/22/2022)  Tobacco Use: Medium Risk (02/15/2023)    Readmission Risk Interventions     No data to display

## 2023-03-02 DIAGNOSIS — M1990 Unspecified osteoarthritis, unspecified site: Secondary | ICD-10-CM | POA: Diagnosis not present

## 2023-03-02 DIAGNOSIS — R531 Weakness: Secondary | ICD-10-CM | POA: Diagnosis not present

## 2023-03-02 DIAGNOSIS — M75101 Unspecified rotator cuff tear or rupture of right shoulder, not specified as traumatic: Secondary | ICD-10-CM | POA: Diagnosis not present

## 2023-03-02 DIAGNOSIS — I5033 Acute on chronic diastolic (congestive) heart failure: Secondary | ICD-10-CM | POA: Diagnosis not present

## 2023-03-02 DIAGNOSIS — H409 Unspecified glaucoma: Secondary | ICD-10-CM | POA: Diagnosis not present

## 2023-03-02 DIAGNOSIS — M6281 Muscle weakness (generalized): Secondary | ICD-10-CM | POA: Diagnosis not present

## 2023-03-02 DIAGNOSIS — D638 Anemia in other chronic diseases classified elsewhere: Secondary | ICD-10-CM | POA: Diagnosis not present

## 2023-03-02 DIAGNOSIS — R2689 Other abnormalities of gait and mobility: Secondary | ICD-10-CM | POA: Diagnosis not present

## 2023-03-02 DIAGNOSIS — Z952 Presence of prosthetic heart valve: Secondary | ICD-10-CM | POA: Diagnosis not present

## 2023-03-02 DIAGNOSIS — M545 Low back pain, unspecified: Secondary | ICD-10-CM | POA: Diagnosis not present

## 2023-03-02 DIAGNOSIS — I6522 Occlusion and stenosis of left carotid artery: Secondary | ICD-10-CM | POA: Diagnosis not present

## 2023-03-02 DIAGNOSIS — Z7401 Bed confinement status: Secondary | ICD-10-CM | POA: Diagnosis not present

## 2023-03-02 DIAGNOSIS — I10 Essential (primary) hypertension: Secondary | ICD-10-CM | POA: Diagnosis not present

## 2023-03-02 DIAGNOSIS — R278 Other lack of coordination: Secondary | ICD-10-CM | POA: Diagnosis not present

## 2023-03-02 DIAGNOSIS — I2510 Atherosclerotic heart disease of native coronary artery without angina pectoris: Secondary | ICD-10-CM | POA: Diagnosis not present

## 2023-03-02 DIAGNOSIS — Z515 Encounter for palliative care: Secondary | ICD-10-CM | POA: Diagnosis not present

## 2023-03-02 DIAGNOSIS — D62 Acute posthemorrhagic anemia: Secondary | ICD-10-CM | POA: Diagnosis not present

## 2023-03-02 DIAGNOSIS — I5032 Chronic diastolic (congestive) heart failure: Secondary | ICD-10-CM | POA: Diagnosis not present

## 2023-03-02 DIAGNOSIS — I110 Hypertensive heart disease with heart failure: Secondary | ICD-10-CM | POA: Diagnosis not present

## 2023-03-02 DIAGNOSIS — E1169 Type 2 diabetes mellitus with other specified complication: Secondary | ICD-10-CM | POA: Diagnosis not present

## 2023-03-02 DIAGNOSIS — N184 Chronic kidney disease, stage 4 (severe): Secondary | ICD-10-CM | POA: Diagnosis not present

## 2023-03-02 DIAGNOSIS — E785 Hyperlipidemia, unspecified: Secondary | ICD-10-CM | POA: Diagnosis not present

## 2023-03-02 DIAGNOSIS — I050 Rheumatic mitral stenosis: Secondary | ICD-10-CM | POA: Diagnosis not present

## 2023-03-02 LAB — GLUCOSE, CAPILLARY: Glucose-Capillary: 98 mg/dL (ref 70–99)

## 2023-03-02 MED ORDER — TORSEMIDE 20 MG PO TABS: 20.0000 mg | ORAL_TABLET | Freq: Every day | ORAL | 0 refills | Status: DC

## 2023-03-02 MED ORDER — METOPROLOL TARTRATE 25 MG PO TABS: 12.5000 mg | ORAL_TABLET | Freq: Two times a day (BID) | ORAL | 0 refills | Status: AC

## 2023-03-02 MED ORDER — ACETAMINOPHEN 325 MG PO TABS: 650.0000 mg | ORAL_TABLET | Freq: Four times a day (QID) | ORAL | Status: DC | PRN

## 2023-03-02 MED ORDER — POLYETHYLENE GLYCOL 3350 17 G PO PACK: 17.0000 g | PACK | Freq: Every day | ORAL | 0 refills | Status: DC | PRN

## 2023-03-02 NOTE — Progress Notes (Signed)
RN called report to nurse supervisor at [employer].

## 2023-03-02 NOTE — TOC Transition Note (Signed)
Transition of Care [facility]) - CM/SW Discharge Note   Patient Details  Name: Sara Garza MRN: [id number] Date of Birth: Jul 01, 1923  Transition of Care [facility]) CM/SW Contact:  Patrice Paradise, LCSW Phone Number: 03/02/2023, 9:58 AM   Clinical Narrative:     Patient will DC to:?[facility] Anticipated DC date:?12/01 Family notified:?Karen by Water quality scientist by: Sharin Mons   Per MD patient ready for DC to [facility] RN, patient, patient's family, and facility notified of DC. Discharge Summary sent to facility. RN given number for report  [phone] room 610A. DC packet on chart. Ambulance transport requested for patient.   CSW signing off.   Judd Lien, [place] [phone]   Final next level of care: Skilled Nursing Facility Barriers to Discharge: Barriers Resolved   Patient Goals and CMS Choice CMS Medicare.gov Compare Post Acute Care list provided to:: Patient Choice offered to / list presented to : Patient  Discharge Placement                Patient chooses bed at: [facility] Patient to be transferred to facility by: [facility] Name of family member notified: [name] Patient and family notified of of transfer: 03/02/23  Discharge Plan and Services Additional resources added to the After Visit Summary for   In-[place] Referral: Clinical Social Work Discharge Planning Services: CM Consult Post Acute Care Choice: Skilled Nursing Facility                               Social Determinants of Health (SDOH) Interventions SDOH Screenings   Food Insecurity: No Food Insecurity (02/16/2023)  Housing: Low Risk  (02/16/2023)  Transportation Needs: No Transportation Needs (02/16/2023)  Utilities: Not At Risk (02/16/2023)  Alcohol Screen: Low Risk  (05/22/2022)  Depression (PHQ2-9): Low Risk  (05/22/2022)  Financial Resource Strain: Low Risk  (05/22/2022)  Physical Activity: Inactive (05/22/2022)  Social Connections: Moderately Integrated (05/22/2022)  Stress: No Stress Concern Present  (05/22/2022)  Tobacco Use: Medium Risk (02/15/2023)     Readmission Risk Interventions     No data to display

## 2023-03-02 NOTE — Progress Notes (Signed)
RN attempted to call report to [facility]

## 2023-03-02 NOTE — Discharge Summary (Addendum)
Physician Discharge Summary   Patient: Sara Garza MRN: [id number] DOB: [birth year]  Admit date:     02/15/2023  Discharge date: 03/02/23  Discharge Physician: York Ram Bentley Fissel   PCP: Shirline Frees, NP   Recommendations at discharge:    Carvedilol has been changed to metoprolol.  Holding on amlodipine due to risk of hypotension.  Changed furosemide to torsemide.  Continue use of prophylactic antibiotic prior to dental procedures due to valvular heart disease.  Follow up renal function and electrolytes in 7 days.  Follow up with Shirline Frees NP in 7 to 10 days.  Follow up with Cardiology as scheduled.  Continue supplemental 02 per [place]  I spoke with patient's daughter over the phone, we talked in detail about patient's condition, plan of care and prognosis and all questions were addressed.   Discharge Diagnoses: Principal Problem:   Acute on chronic diastolic CHF (congestive heart failure) (HCC) Active Problems:   Essential hypertension   CKD (chronic kidney disease) stage 4, GFR 15-29 ml/min (HCC)   CAD (coronary artery disease)   Type 2 diabetes mellitus with hyperlipidemia (HCC)  Resolved Problems:   * No resolved hospital problems. [facility] Course: Sara Garza was admitted to the hospital with the working diagnosis of heart failure exacerbation.   89 yo female with the past medical history of heart failure, coronary artery disease, CKD, T2DM, sp [name] and hypertension who presented with dyspnea. She heart failure stage III, per symptoms and uses a walker for ambulation. Reported 4 days of worsening dyspnea, having symptoms at rest. On her initial physical examination her blood pressure was 133/61, HR 101, RR 24 and 02 saturation 81%, lungs with bilateral rales, no wheezing, heart with S1 and S2 present and regular, with systolic murmur at the base, abdomen with no distention and positive lower extremity edema.   Na 141, K 3,6 Cl 104, bicarbonate 25, glucose 100, bun  46 cr 1,81  BNP 1,485  High sensitive troponin 46 and 39  Wbc 6.0 hgb 10,8 plt 250  Sars covid 19 negative   Chest radiograph with hypoinflation, bilateral hilar vascular congestion with bilateral central interstitial infiltrates, bilateral small pleural effusions.   EKG 87 bpm, right axis deviation, left bundle branch block, qtc 544, sinus rhythm with q waves lead I, aVL, V1 to V3, with no significant ST segment changes, no T wave changes.   Patient was placed on IV furosemide for diuresis.  Patient has had difficulty weaning from oxygen. Palliative care was consulted for goals of care.   11/29 patient is medically stable, pending transfer to SNF on 03/02/23  11/30 plan for transfer to SNF tomorrow.  12/01 patient being transfer to SNF today, continue medical therapy.   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  Trivial pericardial effusion.   Acute on chronic core pulmonale.   Patient was placed on furosemide for diuresis, negative fluid balance was achieved, -5,718 ml, with significant improvement in her symptoms.   Plan to continue torsemide 20 mg daily and metoprolol.  Limited guideline directed medical therapy due to low GFR and risk of hypotension.  She is not candidate for valve intervention.  Essential hypertension Continue blood pressure control with metoprolol.   CKD (chronic kidney disease) stage 4, GFR 15-29 ml/min (HCC) Improved volume status, renal function today with serum cr at 1,93 with K at 4,2 and serum bicarbonate at 32.  Na 142.   Plant to continue diuresis with  oral torsemide Follow up renal function in 7 days as outpatient.   CAD (coronary artery disease) No chest pain or acute coronary syndrome.   Type 2 diabetes mellitus with hyperlipidemia (HCC) Continue glucose cover and monitoring with insulin sliding scale.   Continue with ezetimibe.    Consultants: cardiology  Procedures performed: none   Disposition: Skilled nursing facility Diet recommendation:  Discharge Diet Orders (From admission, onward)     Start     Ordered   03/02/23 0000  Diet - low sodium heart healthy        03/02/23 0845           Cardiac diet DISCHARGE MEDICATION: Allergies as of 03/02/2023       Reactions   Statins Other (See Comments)   Muscle aches   Gabapentin Other (See Comments)   SOMNOLENCE        Medication List     STOP taking these medications    amLODipine 5 MG tablet Commonly known as: NORVASC   carvedilol 3.125 MG tablet Commonly known as: COREG   furosemide 20 MG tablet Commonly known as: LASIX       TAKE these medications    acetaminophen 325 MG tablet Commonly known as: TYLENOL Take 2 tablets (650 mg total) by mouth every 6 (six) hours as needed for moderate pain (pain score 4-6) or mild pain (pain score 1-3). What changed:  medication strength how much to take when to take this reasons to take this   amoxicillin 500 MG capsule Commonly known as: AMOXIL TAKE 4 CAPSULES BY MOUTH 1 HOUR PRIOR TO DENTAL PROCEDURES   aspirin EC 81 MG tablet Take 81 mg by mouth daily. Swallow whole.   Calcium Carb-Cholecalciferol 500-400 MG-UNIT Tabs Take 1 tablet by mouth daily.   ezetimibe 10 MG tablet Commonly known as: ZETIA Take 1 tablet (10 mg total) by mouth daily.   metoprolol tartrate 25 MG tablet Commonly known as: LOPRESSOR Take 0.5 tablets (12.5 mg total) by mouth 2 (two) times daily.   multivitamin tablet Take 1 tablet by mouth daily.   polyethylene glycol 17 g packet Commonly known as: MIRALAX /  GLYCOLAX Take 17 g by mouth daily as needed for mild constipation.   PRESERVISION AREDS 2+MULTI VIT PO Take 2 capsules by mouth in the morning and at bedtime.   torsemide 20 MG tablet Commonly known as: DEMADEX Take 1 tablet (20 mg total) by mouth daily.        Discharge Exam: Filed Weights   02/27/23 0500 02/28/23 0319 03/01/23 1610  Weight: 58.9 kg 57.6 kg 55.5 kg   BP (!) 107/56 (BP Location: Right Arm) Comment: taken twice  Pulse 73   Temp 97.6 F (36.4 C) (Oral)   Resp 18   Ht 4\' 11"  (1.499 m)   Wt 55.5 kg   SpO2 95%   BMI 24.71 kg/m   Patient is feeling better, no chest pain, no dyspnea, orthopnea or PND  Neurology awake and alert ENT with mild pallor Cardiovascular with S1 and S2 present and regular with no gallops, or rubs, mild diastolic murmur at the apex Respiratory with no rales or wheezing Abdomen with no distention  No lower  extremity edema   Condition at discharge: stable  The results of significant diagnostics from this hospitalization (including imaging, microbiology, ancillary and laboratory) are listed below for reference.   Imaging Studies: VAS [place] LOWER EXTREMITY VENOUS (DVT)  Result Date: 02/17/2023  Lower Venous DVT Study Patient Name:  DANNIE RATTERREE  Date of Exam:   02/17/2023 Medical Rec #: [id number]       Accession #:    [id number] Date of Birth: [birth year]        Patient Gender: F Patient Age:   37 years Exam Location:  [facility] Procedure:      VAS [place] LOWER EXTREMITY VENOUS (DVT) Referring Phys: [facility] GOEL --------------------------------------------------------------------------------  Indications: Pain, Swelling, Edema, and SOB.  Comparison Study: Previous study of left lower extremity on 10.14.2016. Performing Technologist: Fernande Bras  Examination Guidelines: A complete evaluation includes B-mode imaging, spectral Doppler, color Doppler, and power Doppler as needed of all accessible portions of each vessel. Bilateral  testing is considered an integral part of a complete examination. Limited examinations for reoccurring indications may be performed as noted. The reflux portion of the exam is performed with the patient in reverse Trendelenburg.  +---------+---------------+---------+-----------+----------+--------------+ RIGHT    CompressibilityPhasicitySpontaneityPropertiesThrombus Aging +---------+---------------+---------+-----------+----------+--------------+ CFV      Full           Yes      Yes                                 +---------+---------------+---------+-----------+----------+--------------+ SFJ      Full                                                        +---------+---------------+---------+-----------+----------+--------------+ FV Prox  Full                                                        +---------+---------------+---------+-----------+----------+--------------+ FV Mid   Full                                                        +---------+---------------+---------+-----------+----------+--------------+ FV DistalFull                                                        +---------+---------------+---------+-----------+----------+--------------+ PFV      Full                                                        +---------+---------------+---------+-----------+----------+--------------+ POP      Full           Yes      Yes                                 +---------+---------------+---------+-----------+----------+--------------+  PTV      Full                                                        +---------+---------------+---------+-----------+----------+--------------+ PERO     Full                                                        +---------+---------------+---------+-----------+----------+--------------+   +---------+---------------+---------+-----------+----------+--------------+ LEFT      CompressibilityPhasicitySpontaneityPropertiesThrombus Aging +---------+---------------+---------+-----------+----------+--------------+ CFV      Full           Yes      Yes                                 +---------+---------------+---------+-----------+----------+--------------+ SFJ      Full                                                        +---------+---------------+---------+-----------+----------+--------------+ FV Prox  Full                                                        +---------+---------------+---------+-----------+----------+--------------+ FV Mid   Full                                                        +---------+---------------+---------+-----------+----------+--------------+ FV DistalFull                                                        +---------+---------------+---------+-----------+----------+--------------+ PFV      Full                                                        +---------+---------------+---------+-----------+----------+--------------+ POP      Full           Yes      Yes                                 +---------+---------------+---------+-----------+----------+--------------+ PTV      Full                                                        +---------+---------------+---------+-----------+----------+--------------+  PERO     Full                                                        +---------+---------------+---------+-----------+----------+--------------+     Summary: BILATERAL: - No evidence of deep vein thrombosis seen in the lower extremities, bilaterally. -No evidence of popliteal cyst, bilaterally.   *See table(s) above for measurements and observations. Electronically signed by Gerarda Fraction on 02/17/2023 at 4:19:27 PM.    Final    ECHOCARDIOGRAM COMPLETE  Result Date: 02/16/2023    ECHOCARDIOGRAM REPORT   Patient Name:   XITLALY HANDLER Date of Exam: 02/16/2023 Medical Rec #:   [id number]      Height:       59.0 in Accession #:    [id number]     Weight:       127.0 lb Date of Birth:  [birth year]       BSA:          1.521 m Patient Age:    89 years       BP:           130/70 mmHg Patient Gender: F              HR:           92 bpm. Exam Location:  Inpatient Procedure: 2D Echo, Cardiac Doppler and Color Doppler STAT ECHO Indications:    acute systolic chf.  History:        Patient has prior history of Echocardiogram examinations, most                 recent 04/15/2022. Chronic kidney disease, Arrythmias:LBBB; Risk                 Factors:Hypertension and Dyslipidemia.                 Aortic Valve: 23 mm Medtronic stented ([name]) valve is present in                 the aortic position. Procedure Date: 2014.  Sonographer:    Delcie Roch RDCS Referring Phys: [id number] Lanier Prude  Sonographer Comments: Image acquisition challenging due to respiratory motion. IMPRESSIONS  1. Left ventricular ejection fraction, by estimation, is 50 to 55%. The left ventricle has low normal function. The left ventricle demonstrates regional wall motion abnormalities- aneurysmal mid ventricular septum with no mural thrombus and stable apperance. There is moderate left ventricular hypertrophy. Left ventricular diastolic parameters are indeterminate.  2. Right ventricular systolic function is normal. The right ventricular size is normal. There is mildly elevated pulmonary artery systolic pressure. The estimated right ventricular systolic pressure is 43.8 mmHg.  3. Left atrial size was severely dilated.  4. The mitral valve is degenerative. Mild to moderate mitral valve regurgitation. Severe mitral stenosis. The mean mitral valve gradient is 13.8 mmHg with average heart rate of 89 bpm. Severe mitral annular calcification.  5. The aortic valve has been repaired/replaced. Aortic valve regurgitation is mild. There is a 23 mm Medtronic stented ([name]) valve present in the aortic position. Procedure Date: 2014. Aortic  valve area, by VTI measures 1.27 cm. Aortic valve mean gradient measures 18.5 mmHg. Aortic valve Vmax measures 2.86 m/s. Aortic valve acceleration time measures 79 msec, AV prosthesis is grossly stable, DVI 0.37.  6.  The inferior vena cava is normal in size with <50% respiratory variability, suggesting right atrial pressure of 8 mmHg. FINDINGS  Left Ventricle: Left ventricular ejection fraction, by estimation, is 50 to 55%. The left ventricle has low normal function. The left ventricle demonstrates regional wall motion abnormalities. The left ventricular internal cavity size was normal in size. There is moderate left ventricular hypertrophy. Left ventricular diastolic parameters are indeterminate.  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 Right Ventricle: The right ventricular size is normal. No increase in right ventricular wall thickness. Right ventricular systolic function is normal. There is mildly elevated pulmonary artery systolic pressure. The tricuspid regurgitant velocity is 2.99  m/s, and with an assumed right atrial pressure of 8 mmHg, the estimated right ventricular systolic pressure is 43.8 mmHg. Left Atrium: Left atrial size was severely dilated. Right Atrium: Right atrial size was normal in size. Pericardium: Trivial pericardial effusion is present. Presence of epicardial fat layer. Mitral Valve: The mitral valve is degenerative in appearance. Severe mitral annular calcification. Mild to moderate mitral valve regurgitation. Severe mitral valve stenosis. MV peak gradient, 27.4 mmHg. The mean mitral valve gradient is 13.8 mmHg with average heart rate of 89 bpm. Tricuspid Valve: The tricuspid valve is normal in structure. Tricuspid valve regurgitation is trivial. No evidence of tricuspid stenosis. Aortic Valve: The aortic valve has been repaired/replaced. Aortic valve regurgitation is mild. Aortic valve mean gradient measures 18.5 mmHg. Aortic valve peak gradient measures 32.7 mmHg.  Aortic valve area, by VTI measures 1.27 cm. There is a 23 mm Medtronic stented ([name]) valve present in the aortic position. Procedure Date: 2014. Pulmonic Valve: The pulmonic valve was normal in structure. Pulmonic valve regurgitation is mild. No evidence of pulmonic stenosis. Aorta: The aortic root is normal in size and structure. Venous: The inferior vena cava is normal in size with less than 50% respiratory variability, suggesting right atrial pressure of 8 mmHg. IAS/Shunts: The atrial septum is grossly normal.  LEFT VENTRICLE PLAX 2D LVIDd:         5.10 cm LVIDs:         3.80 cm LV PW:         1.10 cm LV IVS:        1.10 cm LVOT diam:     2.10 cm LV SV:         71 LV SV Index:   47 LVOT Area:     3.46 cm  RIGHT VENTRICLE             IVC RV Basal diam:  2.70 cm     IVC diam: 1.90 cm RV S prime:     10.30 cm/s TAPSE (M-mode): 1.4 cm LEFT ATRIUM              Index        RIGHT ATRIUM           Index LA diam:        4.80 cm  3.16 cm/m   RA Area:     13.20 cm LA Vol (A2C):   89.6 ml  58.93 ml/m  RA Volume:   31.50 ml  20.72 ml/m LA Vol (A4C):   123.0 ml 80.89 ml/m LA Biplane Vol: 113.0 ml 74.31 ml/m  AORTIC VALVE AV Area (Vmax):    1.30 cm AV Area (Vmean):   1.18 cm AV Area (VTI):     1.27 cm AV Vmax:           285.76 cm/s  AV Vmean:          216.081 cm/s AV VTI:            0.562 m AV Peak Grad:      32.7 mmHg AV Mean Grad:      18.5 mmHg LVOT Vmax:         107.25 cm/s LVOT Vmean:        73.575 cm/s LVOT VTI:          0.206 m LVOT/AV VTI ratio: 0.37  AORTA Ao Root diam: 2.80 cm Ao Asc diam:  2.80 cm MITRAL VALVE              TRICUSPID VALVE MV Peak grad: 27.4 mmHg   TR Peak grad:   35.8 mmHg MV Mean grad: 13.8 mmHg   TR Vmax:        299.00 cm/s MV Vmax:      2.62 m/s MV Vmean:     180.5 cm/s  SHUNTS MR Peak grad: 59.5 mmHg   Systemic VTI:  0.21 m MR Mean grad: 44.0 mmHg   Systemic Diam: 2.10 cm MR Vmax:      385.67 cm/s MR Vmean:     267.0 cm/s Weston Brass MD Electronically signed by Weston Brass MD  Signature Date/Time: 02/16/2023/6:08:12 PM    Final    DG Chest Portable 1 View  Result Date: 02/15/2023 CLINICAL DATA:  Hypoxia and shortness of breath. EXAM: PORTABLE CHEST 1 VIEW COMPARISON:  Chest CT dated 02/14/2020. FINDINGS: There is mild cardiomegaly with central vascular congestion and mild edema. Small bilateral pleural effusions and bibasilar atelectasis. Pneumonia is not excluded. No pneumothorax. Aortic valve repair. Atherosclerotic calcification of the aorta. No acute osseous pathology. IMPRESSION: Mild CHF and small bilateral pleural effusions. Electronically Signed   By: Elgie Collard M.D.   On: 02/15/2023 18:49    Microbiology: Results for orders placed or performed during the hospital encounter of 02/15/23  Resp panel by RT-PCR (RSV, Flu A&B, Covid) Anterior Nasal Swab     Status: None   Collection Time: 02/15/23  5:29 PM   Specimen: Anterior Nasal Swab  Result Value Ref Range Status   SARS Coronavirus 2 by RT PCR NEGATIVE NEGATIVE Final    Comment: (NOTE) SARS-CoV-2 target nucleic acids are NOT DETECTED.  The SARS-CoV-2 RNA is generally detectable in upper respiratory specimens during the acute phase of infection. The lowest concentration of SARS-CoV-2 viral copies this assay can detect is 138 copies/mL. A negative result does not preclude SARS-Cov-2 infection and should not be used as the sole basis for treatment or other patient management decisions. A negative result may occur with  improper specimen collection/handling, submission of specimen other than nasopharyngeal swab, presence of viral mutation(s) within the areas targeted by this assay, and inadequate number of viral copies(<138 copies/mL). A negative result must be combined with clinical observations, patient history, and epidemiological information. The expected result is Negative.  Fact Sheet for Patients:  BloggerCourse.com  Fact Sheet for Healthcare Providers:   SeriousBroker.it  This test is no t yet approved or cleared by the [place] FDA and  has been authorized for detection and/or diagnosis of SARS-CoV-2 by FDA under an Emergency Use Authorization (EUA). This EUA will remain  in effect (meaning this test can be used) for the duration of the COVID-19 declaration under Section 564(b)(1) of the Act, 21 U.S.C.section 360bbb-3(b)(1), unless the authorization is terminated  or revoked sooner.       Influenza A by  PCR NEGATIVE NEGATIVE Final   Influenza B by PCR NEGATIVE NEGATIVE Final    Comment: (NOTE) The Xpert Xpress SARS-CoV-2/FLU/RSV plus assay is intended as an aid in the diagnosis of influenza from Nasopharyngeal swab specimens and should not be used as a sole basis for treatment. Nasal washings and aspirates are unacceptable for Xpert Xpress SARS-CoV-2/FLU/RSV testing.  Fact Sheet for Patients: BloggerCourse.com  Fact Sheet for Healthcare Providers: SeriousBroker.it  This test is not yet approved or cleared by the [place] FDA and has been authorized for detection and/or diagnosis of SARS-CoV-2 by FDA under an Emergency Use Authorization (EUA). This EUA will remain in effect (meaning this test can be used) for the duration of the COVID-19 declaration under Section 564(b)(1) of the Act, 21 U.S.C. section 360bbb-3(b)(1), unless the authorization is terminated or revoked.     Resp Syncytial Virus by PCR NEGATIVE NEGATIVE Final    Comment: (NOTE) Fact Sheet for Patients: BloggerCourse.com  Fact Sheet for Healthcare Providers: SeriousBroker.it  This test is not yet approved or cleared by the [place] FDA and has been authorized for detection and/or diagnosis of SARS-CoV-2 by FDA under an Emergency Use Authorization (EUA). This EUA will remain in effect (meaning this test can be used) for  the duration of the COVID-19 declaration under Section 564(b)(1) of the Act, 21 U.S.C. section 360bbb-3(b)(1), unless the authorization is terminated or revoked.  Performed at [facility], [address], [place], [place] [address]   MRSA Next Gen by PCR, Nasal     Status: None   Collection Time: 02/16/23  2:30 PM   Specimen: Nasal Mucosa; Nasal Swab  Result Value Ref Range Status   MRSA by PCR Next Gen NOT DETECTED NOT DETECTED Final    Comment: (NOTE) The GeneXpert MRSA Assay (FDA approved for NASAL specimens only), is one component of a comprehensive MRSA colonization surveillance program. It is not intended to diagnose MRSA infection nor to guide or monitor treatment for MRSA infections. Test performance is not FDA approved in patients less than 26 years old. Performed at [facility], [address], [place], [place] [address]   Culture, blood (Routine X 2) w Reflex to ID Panel     Status: None   Collection Time: 02/16/23  4:22 PM   Specimen: BLOOD RIGHT ARM  Result Value Ref Range Status   Specimen Description BLOOD RIGHT ARM  Final   Special Requests   Final    BOTTLES DRAWN AEROBIC AND ANAEROBIC Blood Culture adequate volume   Culture   Final    NO GROWTH 5 DAYS Performed at [facility], [address] [place], [place] [address]    Report Status 02/21/2023 FINAL  Final  Culture, blood (Routine X 2) w Reflex to ID Panel     Status: None   Collection Time: 02/16/23  4:27 PM   Specimen: BLOOD RIGHT HAND  Result Value Ref Range Status   Specimen Description BLOOD RIGHT HAND  Final   Special Requests   Final    BOTTLES DRAWN AEROBIC AND ANAEROBIC Blood Culture adequate volume   Culture   Final    NO GROWTH 5 DAYS Performed at [facility], [address], [place], [place] [address]    Report Status 02/21/2023 FINAL  Final  Respiratory (~20 pathogens) panel by PCR     Status: None   Collection Time: 02/17/23  9:27 AM    Specimen: Nasopharyngeal Swab; Respiratory  Result Value Ref  Range Status   Adenovirus NOT DETECTED NOT DETECTED Final   Coronavirus 229E NOT DETECTED NOT DETECTED Final    Comment: (NOTE) The Coronavirus on the Respiratory Panel, DOES NOT test for the novel  Coronavirus (2019 nCoV)    Coronavirus HKU1 NOT DETECTED NOT DETECTED Final   Coronavirus NL63 NOT DETECTED NOT DETECTED Final   Coronavirus OC43 NOT DETECTED NOT DETECTED Final   Metapneumovirus NOT DETECTED NOT DETECTED Final   Rhinovirus / Enterovirus NOT DETECTED NOT DETECTED Final   Influenza A NOT DETECTED NOT DETECTED Final   Influenza B NOT DETECTED NOT DETECTED Final   Parainfluenza Virus 1 NOT DETECTED NOT DETECTED Final   Parainfluenza Virus 2 NOT DETECTED NOT DETECTED Final   Parainfluenza Virus 3 NOT DETECTED NOT DETECTED Final   Parainfluenza Virus 4 NOT DETECTED NOT DETECTED Final   Respiratory Syncytial Virus NOT DETECTED NOT DETECTED Final   Bordetella pertussis NOT DETECTED NOT DETECTED Final   Bordetella Parapertussis NOT DETECTED NOT DETECTED Final   Chlamydophila pneumoniae NOT DETECTED NOT DETECTED Final   Mycoplasma pneumoniae NOT DETECTED NOT DETECTED Final    Comment: Performed at [facility], [address], [place], [place] [address]    Labs: CBC: Recent Labs  Lab 02/26/23 0806 02/27/23 0651  WBC 5.9 5.6  NEUTROABS 4.3 3.9  HGB 9.4* 8.7*  HCT 30.5* 28.2*  MCV 96.8 96.6  PLT 209 203   Basic Metabolic Panel: Recent Labs  Lab 02/24/23 0206 02/25/23 0210 02/26/23 0806 02/27/23 0651 02/28/23 0838  NA 142 143 143 140 142  K 4.5 4.3 4.2 4.2 4.2  CL 105 104 102 99 100  CO2 29 30 30  32 32  GLUCOSE 105* 108* 132* 94 138*  BUN 64* 65* 60* 63* 62*  CREATININE 2.07* 1.95* 2.03* 2.11* 1.93*  CALCIUM 8.6* 8.6* 8.8* 8.6* 8.8*  MG  --   --   --  2.5*  --   PHOS  --   --   --  4.2 4.4   Liver Function Tests: Recent Labs  Lab 02/26/23 0806 02/27/23 0651 02/28/23 0838  AST 15 16  --    ALT 13 13  --   ALKPHOS 49 45  --   BILITOT 0.7 0.6  --   PROT 6.1* 5.8*  --   ALBUMIN 2.5* 2.4* 2.7*   CBG: Recent Labs  Lab 03/01/23 0630 03/01/23 1124 03/01/23 1636 03/01/23 2110 03/02/23 0627  GLUCAP 85 99 127* 135* 98    Discharge time spent: greater than 30 minutes.  Signed: Coralie Keens, MD [facility] 03/02/2023

## 2023-03-04 ENCOUNTER — Other Ambulatory Visit: Payer: Self-pay | Admitting: *Deleted

## 2023-03-04 NOTE — Patient Outreach (Signed)
Late entry for 03/03/23. Mrs. Ruark resides in [facility]. Screening for potential chronic care management services as a benefit of health plan and primary care provider.  Telephonic meeting with Dese[place], SNF social worker. Recently admitted to [facility]. Careplan meeting scheduled for 03/04/23.   Will continue to follow for potential complex care management needs.   Raiford Noble, MSN, RN, BSN [place] [facility], [facility] RN Post- Acute Care Manager Direct Dial: [phone]

## 2023-03-11 ENCOUNTER — Encounter: Payer: Medicare Other | Admitting: Adult Health

## 2023-03-13 DIAGNOSIS — Z515 Encounter for palliative care: Secondary | ICD-10-CM | POA: Diagnosis not present

## 2023-03-13 DIAGNOSIS — N184 Chronic kidney disease, stage 4 (severe): Secondary | ICD-10-CM | POA: Diagnosis not present

## 2023-03-13 DIAGNOSIS — I110 Hypertensive heart disease with heart failure: Secondary | ICD-10-CM | POA: Diagnosis not present

## 2023-03-13 DIAGNOSIS — D638 Anemia in other chronic diseases classified elsewhere: Secondary | ICD-10-CM | POA: Diagnosis not present

## 2023-03-14 ENCOUNTER — Encounter: Payer: Medicare Other | Admitting: Adult Health

## 2023-03-17 ENCOUNTER — Other Ambulatory Visit: Payer: Self-pay | Admitting: Cardiovascular Disease

## 2023-03-18 ENCOUNTER — Telehealth: Payer: Self-pay | Admitting: Adult Health

## 2023-03-18 ENCOUNTER — Ambulatory Visit: Payer: Medicare Other | Admitting: Family Medicine

## 2023-03-18 DIAGNOSIS — R32 Unspecified urinary incontinence: Secondary | ICD-10-CM | POA: Diagnosis not present

## 2023-03-18 DIAGNOSIS — I5033 Acute on chronic diastolic (congestive) heart failure: Secondary | ICD-10-CM | POA: Diagnosis not present

## 2023-03-18 DIAGNOSIS — N184 Chronic kidney disease, stage 4 (severe): Secondary | ICD-10-CM | POA: Diagnosis not present

## 2023-03-18 DIAGNOSIS — Z9981 Dependence on supplemental oxygen: Secondary | ICD-10-CM | POA: Diagnosis not present

## 2023-03-18 DIAGNOSIS — E1122 Type 2 diabetes mellitus with diabetic chronic kidney disease: Secondary | ICD-10-CM | POA: Diagnosis not present

## 2023-03-18 DIAGNOSIS — Z952 Presence of prosthetic heart valve: Secondary | ICD-10-CM | POA: Diagnosis not present

## 2023-03-18 DIAGNOSIS — I2510 Atherosclerotic heart disease of native coronary artery without angina pectoris: Secondary | ICD-10-CM | POA: Diagnosis not present

## 2023-03-18 DIAGNOSIS — I130 Hypertensive heart and chronic kidney disease with heart failure and stage 1 through stage 4 chronic kidney disease, or unspecified chronic kidney disease: Secondary | ICD-10-CM | POA: Diagnosis not present

## 2023-03-18 DIAGNOSIS — Z7982 Long term (current) use of aspirin: Secondary | ICD-10-CM | POA: Diagnosis not present

## 2023-03-18 DIAGNOSIS — E1159 Type 2 diabetes mellitus with other circulatory complications: Secondary | ICD-10-CM | POA: Diagnosis not present

## 2023-03-18 DIAGNOSIS — I340 Nonrheumatic mitral (valve) insufficiency: Secondary | ICD-10-CM | POA: Diagnosis not present

## 2023-03-18 DIAGNOSIS — I342 Nonrheumatic mitral (valve) stenosis: Secondary | ICD-10-CM | POA: Diagnosis not present

## 2023-03-18 DIAGNOSIS — E785 Hyperlipidemia, unspecified: Secondary | ICD-10-CM | POA: Diagnosis not present

## 2023-03-18 NOTE — Telephone Encounter (Signed)
Patient daughter notified of update  and verbalized understanding. 

## 2023-03-18 NOTE — Telephone Encounter (Signed)
Please advise if this is still your pt. If so, please advise on note below.

## 2023-03-18 NOTE — Telephone Encounter (Signed)
Pt daughter notified of update. She did want to know if the 40 mg tablet needs to be divided for 20mg  in the a.m and 20 in the evening.

## 2023-03-18 NOTE — Telephone Encounter (Signed)
Daughter called to say HHA hears crackling in Pt's chest . She does not want to take Pt to the ED or UC because of financial concerns. Daughter is asking if NP could increase torsemide?

## 2023-03-19 NOTE — Telephone Encounter (Signed)
Noted  

## 2023-03-19 NOTE — Telephone Encounter (Signed)
Patient daughter notified of update  and verbalized understanding. 

## 2023-03-19 NOTE — Telephone Encounter (Signed)
Tried to call pt daughter to advise of message below but no answer and vm full.

## 2023-03-20 ENCOUNTER — Ambulatory Visit: Payer: Medicare Other | Admitting: Adult Health

## 2023-03-20 ENCOUNTER — Other Ambulatory Visit (INDEPENDENT_AMBULATORY_CARE_PROVIDER_SITE_OTHER): Payer: Medicare Other

## 2023-03-20 VITALS — BP 120/60 | HR 85 | Temp 98.1°F

## 2023-03-20 DIAGNOSIS — I5033 Acute on chronic diastolic (congestive) heart failure: Secondary | ICD-10-CM

## 2023-03-20 DIAGNOSIS — E785 Hyperlipidemia, unspecified: Secondary | ICD-10-CM | POA: Diagnosis not present

## 2023-03-20 DIAGNOSIS — I2510 Atherosclerotic heart disease of native coronary artery without angina pectoris: Secondary | ICD-10-CM

## 2023-03-20 DIAGNOSIS — E1121 Type 2 diabetes mellitus with diabetic nephropathy: Secondary | ICD-10-CM | POA: Diagnosis not present

## 2023-03-20 DIAGNOSIS — N184 Chronic kidney disease, stage 4 (severe): Secondary | ICD-10-CM | POA: Diagnosis not present

## 2023-03-20 DIAGNOSIS — Z9981 Dependence on supplemental oxygen: Secondary | ICD-10-CM

## 2023-03-20 DIAGNOSIS — I10 Essential (primary) hypertension: Secondary | ICD-10-CM

## 2023-03-20 NOTE — Progress Notes (Addendum)
Subjective:    Patient ID: Sara Garza, female    DOB: [birth year], 89 y.o.   MRN: [id number]  HPI 89 year old female who  has a past medical history of Arthritis, CAD (coronary artery disease), Carotid stenosis, left, Diabetes mellitus, Glaucoma, Hyperlipidemia, Hypertension, Leg pain, Osteoporosis, and Valvular heart disease.  She presents to the office today for TCM visit   Admit Date 02/15/2023 Discharge Date 03/02/2023 Discharge from [facility] 03/16/2023  She presented to the emergency room with dyspnea.  She reported 4 days of worsening dyspnea with having symptoms at rest.  On initial exam her blood pressure was 133/61, heart rate 101, respirations 24, and O2 saturation 81%.  She had Rales bilaterally, no wheezing.  Heart with S1 and S2 present and regular with systolic murmur at base.  There was no abdomen distention and she did have some lower extremity edema.  Na 141, K 3,6 Cl 104, bicarbonate 25, glucose 100, bun 46 cr 1,81  BNP 1,485  High sensitive troponin 46 and 39  Wbc 6.0 hgb 10,8 plt 250  Sars covid 19 negative    Chest radiograph with hypoinflation, bilateral hilar vascular congestion with bilateral central interstitial infiltrates, bilateral small pleural effusions.    EKG 87 bpm, right axis deviation, left bundle branch block, qtc 544, sinus rhythm with q waves lead I, aVL, V1 to V3, with no significant ST segment changes, no T wave changes.   She was placed on IV furosemide for diuresis.    Hospital Course  Acute on Chronic diastolic CHF  -Her echo with preserved LV systolic function with EF 50 to 55%, moderate left ventricular hypertrophy, RV systolic function preserved, LA with severe dilation, out to moderate mitral valve regurgitation, severe mitral valve stenosis, aortic valve status post [name] with stable function -She was placed on furosemide for diuresis with a negative fluid balance of 5718 mL with significant improvement in her symptoms. -She was continued  on torsemide 20 mg daily and metoprolol ( Cored was d/c and she was switched to Metoprolol 12.5 mg BID).   Essential Hypertension  -Continue blood pressure control with metoprolol   CKD stage 4 -Improved volume status, renal function with serum creatinine 1.93 and K at 4.2, sodium bicarb at 32, sodium 142 discharge -Continue to diurese with oral torsemide and follow-up renal function in 7 days as outpatient  CAD -No chest pain or acute coronary syndrome noted during hospital admission  Type 2 DM  -Insulin sliding scale was done in the hospital  Hyperlipidemia  - Continues on Zetia  On follow-up today she is with her daughter.  Her daughter contacted me a couple of days ago as home health RN was at the [facility] and noticed some crackles in the patient's lungs.  At this time we increased her torsemide to 20 mg twice daily.  She reports that while in the hospital she was weaned off her oxygen but when she was at her skilled nursing facility she was placed back on as she became very winded and her oxygen saturations dropped while she was walking.  She was doing well with 0.5 L but then prior to discharge her orders were for 2 L so this is what has been kept at.  She is not a fan of wearing oxygen or having oxygen tubing around her [place].  She is monitoring her oxygen saturation at home and i has been continuously above 90%.  She continues to have some lower extremity edema but not nearly  as bad as when she was in the hospital.  Overall she is feeling better than when she was in the hospital but still feels weak and short of breath.  She has not had any fevers, chills, or chest pain.  She will start home PT OT next week and home health RN will be making periodic visits.  Review of Systems  Constitutional:  Positive for fatigue.  HENT: Negative.    Eyes: Negative.   Respiratory:  Positive for shortness of breath.   Cardiovascular:  Positive for leg swelling.  Gastrointestinal: Negative.    Endocrine: Negative.   Genitourinary: Negative.   Musculoskeletal:  Positive for arthralgias and back pain.  Skin: Negative.   Allergic/Immunologic: Negative.   Neurological:  Positive for weakness.  Hematological: Negative.   Psychiatric/Behavioral: Negative.     Past Medical History:  Diagnosis Date   Arthritis    CAD (coronary artery disease)    Carotid stenosis, left    50-60% stable   Diabetes mellitus    Glaucoma    Hyperlipidemia    Hypertension    Leg pain    ABIs 2/18: normal bilaterally.   Osteoporosis    Valvular heart disease    Aortic Stenosis s/p [name] 2014 // Mitral stenosis // Echo 09/2018: EF > 65, mod LVH, severe focal basal hypertrophy, RVSP 39.8, mild to mod MR, mod to severe MS (mean 9), AVR with mild AI, severe LAE (similar to prior echo)    Social History   Socioeconomic History   Marital status: Widowed    Spouse name: Not on file   Number of children: Not on file   Years of education: Not on file   Highest education level: Not on file  Occupational History   Not on file  Tobacco Use   Smoking status: Never    Passive exposure: Yes   Smokeless tobacco: Never  Substance and Sexual Activity   Alcohol use: Yes    Alcohol/week: 0.0 standard drinks of alcohol    Comment: very seldom   Drug use: No   Sexual activity: Not Currently  Other Topics Concern   Not on file  Social History Narrative   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      She likes to read and she takes classes at [employer]   Social Drivers of [employer]: Low Risk  (05/22/2022)   Overall Financial Resource Strain (CARDIA)    Difficulty of Paying Living Expenses: Not hard at all  Food Insecurity: No Food Insecurity (02/16/2023)   Hunger Vital Sign    Worried About Running Out of Food in the Last Year: Never true    Ran Out of Food in the Last Year: Never true  Transportation Needs: No Transportation Needs  (02/16/2023)   PRAPARE - Administrator, Civil Service (Medical): No    Lack of Transportation (Non-Medical): No  Physical Activity: Inactive (05/22/2022)   Exercise Vital Sign    Days of Exercise per Week: 0 days    Minutes of Exercise per Session: 0 min  Stress: No Stress Concern Present (05/22/2022)   [employer] of Occupational Health - Occupational Stress Questionnaire    Feeling of Stress : Not at all  Social Connections: Moderately Integrated (05/22/2022)   Social Connection and Isolation Panel [NHANES]    Frequency of Communication with Friends and Family: More than three times a week  Frequency of Social Gatherings with Friends and Family: More than three times a week    Attends Religious Services: More than 4 times per year    Active Member of Clubs or Organizations: Yes    Attends Banker Meetings: More than 4 times per year    Marital Status: Widowed  Intimate Partner Violence: Not At Risk (02/16/2023)   Humiliation, Afraid, Rape, and Kick questionnaire    Fear of Current or Ex-Partner: No    Emotionally Abused: No    Physically Abused: No    Sexually Abused: No    Past Surgical History:  Procedure Laterality Date   ANOMALOUS PULMONARY VENOUS RETURN REPAIR, TOTAL     APPENDECTOMY     CARDIAC VALVE REPLACEMENT  04/29/2012   Cataracts Bilateral    CESAREAN SECTION     FOOT SURGERY     [name]   HIP ARTHROPLASTY Right 07/31/2013   Procedure: ARTHROPLASTY BIPOLAR HIP;  Surgeon: Shelda Pal, MD;  Location: [facility];  Service: Orthopedics;  Laterality: Right;   INTRAMEDULLARY (IM) NAIL INTERTROCHANTERIC Left 01/01/2020   Procedure: INTRAMEDULLARY (IM) NAIL INTERTROCHANTRIC;  Surgeon: Samson Frederic, MD;  Location: [facility];  Service: Orthopedics;  Laterality: Left;   PERCUTANEOUS CORONARY STENT INTERVENTION (PCI-S) N/A 01/02/2012   Procedure: PERCUTANEOUS CORONARY STENT INTERVENTION (PCI-S);  Surgeon: Tonny Bollman, MD;  Location: [facility] CATH LAB;   Service: Cardiovascular;  Laterality: N/A;    Family History  Problem Relation Age of Onset   COPD Father    Cancer Father        lung cancer   Lung cancer Other     Allergies  Allergen Reactions   Statins Other (See Comments)    Muscle aches   Gabapentin Other (See Comments)    SOMNOLENCE    Current Outpatient Medications on File Prior to Visit  Medication Sig Dispense Refill   acetaminophen (TYLENOL) 325 MG tablet Take 2 tablets (650 mg total) by mouth every 6 (six) hours as needed for moderate pain (pain score 4-6) or mild pain (pain score 1-3).     amoxicillin (AMOXIL) 500 MG capsule TAKE 4 CAPSULES BY MOUTH 1 HOUR PRIOR TO DENTAL PROCEDURES 12 capsule 2   aspirin EC 81 MG tablet Take 81 mg by mouth daily. Swallow whole.     Calcium Carb-Cholecalciferol 500-400 MG-UNIT TABS Take 1 tablet by mouth daily.     ezetimibe (ZETIA) 10 MG tablet Take 1 tablet (10 mg total) by mouth daily. 90 tablet 3   metoprolol tartrate (LOPRESSOR) 25 MG tablet Take 0.5 tablets (12.5 mg total) by mouth 2 (two) times daily. 30 tablet 0   Multiple Vitamin (MULTIVITAMIN) tablet Take 1 tablet by mouth daily.     Multiple Vitamins-Minerals (PRESERVISION AREDS 2+MULTI VIT PO) Take 2 capsules by mouth in the morning and at bedtime.     polyethylene glycol (MIRALAX / GLYCOLAX) 17 g packet Take 17 g by mouth daily as needed for mild constipation. 14 each 0   torsemide (DEMADEX) 20 MG tablet Take 1 tablet (20 mg total) by mouth daily. 30 tablet 0   No current facility-administered medications on file prior to visit.    BP 120/60   Pulse 85   Temp 98.1 F (36.7 C) (Oral)   SpO2 95%        Objective:   Physical Exam Vitals and nursing note reviewed.  Constitutional:      Appearance: Normal appearance. She is ill-appearing.  Cardiovascular:     Rate  and Rhythm: Normal rate and regular rhythm.     Pulses: Normal pulses.     Heart sounds: Normal heart sounds.  Pulmonary:     Effort: Pulmonary  effort is normal. No respiratory distress.     Breath sounds: Normal breath sounds. No stridor. No wheezing or rhonchi.  Musculoskeletal:     Right lower leg: 2+ Pitting Edema present.     Left lower leg: 2+ Pitting Edema present.  Skin:    General: Skin is warm and dry.  Neurological:     General: No focal deficit present.     Mental Status: She is alert and oriented to person, place, and time.  Psychiatric:        Mood and Affect: Mood normal.        Behavior: Behavior normal.        Thought Content: Thought content normal.        Judgment: Judgment normal.        Assessment & Plan:  1. Acute on chronic diastolic CHF (congestive heart failure) (HCC) (Primary) -Reviewed hospital notes, discharge instructions, labs, imaging, and medication changes with the patient and her daughter.  All questions answered to the best of my ability.  She does continue to have lower extremity edema that is pitting.  Will check BMP today and see if we can increase her torsemide to 40 mg daily.  He was encouraged to elevate legs while at rest.  Follow-up with cardiology in a few weeks. - Basic Metabolic Panel; Future  2. Benign essential HTN - controlled. Continue with Metoprolol  - Basic Metabolic Panel; Future  3. CKD (chronic kidney disease) stage 4, GFR 15-29 ml/min (HCC) -Avoid nephrotoxic agents.  Will need to closely monitor being on torsemide - Basic Metabolic Panel; Future  4. Coronary artery disease involving native coronary artery of native heart without angina pectoris - Continue zetia  - Basic Metabolic Panel; Future  5. Controlled type 2 diabetes mellitus with diabetic nephropathy, without long-term current use of insulin (HCC) - No need for insulin therapy at this time - Continue to monitor  - Basic Metabolic Panel; Future  6. Hyperlipidemia, unspecified hyperlipidemia type - Contineu Zetia - Basic Metabolic Panel; Future   7. On home oxygen therapy - continue with 1-2 liters  via [place]  Shirline Frees, NP

## 2023-03-21 ENCOUNTER — Telehealth: Payer: Self-pay

## 2023-03-21 ENCOUNTER — Telehealth: Payer: Self-pay | Admitting: Adult Health

## 2023-03-21 DIAGNOSIS — E1159 Type 2 diabetes mellitus with other circulatory complications: Secondary | ICD-10-CM | POA: Diagnosis not present

## 2023-03-21 DIAGNOSIS — N184 Chronic kidney disease, stage 4 (severe): Secondary | ICD-10-CM | POA: Diagnosis not present

## 2023-03-21 DIAGNOSIS — E785 Hyperlipidemia, unspecified: Secondary | ICD-10-CM | POA: Diagnosis not present

## 2023-03-21 DIAGNOSIS — I130 Hypertensive heart and chronic kidney disease with heart failure and stage 1 through stage 4 chronic kidney disease, or unspecified chronic kidney disease: Secondary | ICD-10-CM | POA: Diagnosis not present

## 2023-03-21 DIAGNOSIS — E1122 Type 2 diabetes mellitus with diabetic chronic kidney disease: Secondary | ICD-10-CM | POA: Diagnosis not present

## 2023-03-21 DIAGNOSIS — I5033 Acute on chronic diastolic (congestive) heart failure: Secondary | ICD-10-CM | POA: Diagnosis not present

## 2023-03-21 LAB — BASIC METABOLIC PANEL
BUN: 64 mg/dL — ABNORMAL HIGH (ref 6–23)
CO2: 29 meq/L (ref 19–32)
Calcium: 8.5 mg/dL (ref 8.4–10.5)
Chloride: 101 meq/L (ref 96–112)
Creatinine, Ser: 2.04 mg/dL — ABNORMAL HIGH (ref 0.40–1.20)
GFR: 20.03 mL/min — ABNORMAL LOW (ref >60.00)
Glucose, Bld: 97 mg/dL (ref 70–99)
Potassium: 4.5 meq/L (ref 3.5–5.1)
Sodium: 141 meq/L (ref 135–145)

## 2023-03-21 NOTE — Telephone Encounter (Signed)
Pt daughter call and stated she is returning your call about her lab results and want a call back.

## 2023-03-21 NOTE — Telephone Encounter (Signed)
Copied from CRM [id number]. Topic: General - Other >> Mar 21, 2023  3:53 PM Florestine Avers wrote: Reason for CRM: Returning "Nigel Sloop" phone call is requesting he call her back specifically.

## 2023-03-21 NOTE — Telephone Encounter (Signed)
Copied from CRM [id number]. Topic: Clinical - Home Health Verbal Orders >> Mar 21, 2023  2:05 PM Pascal Lux wrote: Caller/Agency: [facility] Number: [phone] Service Requested: palliative services.  Frequency: TRN Any new concerns about the patient? No

## 2023-03-21 NOTE — Telephone Encounter (Signed)
Please advise 

## 2023-03-21 NOTE — Telephone Encounter (Signed)
Tried calling pt daughter but no answer. Left vm advising that it may have been an old call. I spoke with pt daughter the other day and advised of any update. No call back is needed.

## 2023-03-25 DIAGNOSIS — I5033 Acute on chronic diastolic (congestive) heart failure: Secondary | ICD-10-CM | POA: Diagnosis not present

## 2023-03-25 DIAGNOSIS — I130 Hypertensive heart and chronic kidney disease with heart failure and stage 1 through stage 4 chronic kidney disease, or unspecified chronic kidney disease: Secondary | ICD-10-CM | POA: Diagnosis not present

## 2023-03-25 DIAGNOSIS — N184 Chronic kidney disease, stage 4 (severe): Secondary | ICD-10-CM | POA: Diagnosis not present

## 2023-03-25 DIAGNOSIS — E1122 Type 2 diabetes mellitus with diabetic chronic kidney disease: Secondary | ICD-10-CM | POA: Diagnosis not present

## 2023-03-25 DIAGNOSIS — E785 Hyperlipidemia, unspecified: Secondary | ICD-10-CM | POA: Diagnosis not present

## 2023-03-25 DIAGNOSIS — E1159 Type 2 diabetes mellitus with other circulatory complications: Secondary | ICD-10-CM | POA: Diagnosis not present

## 2023-03-27 ENCOUNTER — Telehealth: Payer: Self-pay

## 2023-03-27 ENCOUNTER — Other Ambulatory Visit: Payer: Self-pay | Admitting: Adult Health

## 2023-03-27 MED ORDER — TORSEMIDE 40 MG PO TABS: 40.0000 mg | ORAL_TABLET | Freq: Every day | ORAL | 1 refills | Status: DC

## 2023-03-27 NOTE — Telephone Encounter (Signed)
Patient daughter [name] notified of update  and verbalized understanding.

## 2023-03-27 NOTE — Telephone Encounter (Signed)
Sara Garza wants to know if pt should be taking Aspirin with this torsemide. Sara Garza stated that she looked the medication up and it states that you should not take Aspirin with this.

## 2023-03-27 NOTE — Telephone Encounter (Unsigned)
Copied from CRM [id number]. Topic: Clinical - Medical Advice >> Mar 27, 2023 11:12 AM Sim Boast F wrote: Reason for CRM: Patient daughter called wants to make [facility] aware that she gave pt an extra dose of the Torsemide lastnight and wants to know how to follow up? Request a call back

## 2023-03-27 NOTE — Telephone Encounter (Signed)
Spoke to [name] and she stated that the pt legs are still the same still has pitting edema. Also, pt daughter stated that pt is now how breathing problems and very fatigue.

## 2023-03-27 NOTE — Telephone Encounter (Signed)
Please advise 

## 2023-03-31 ENCOUNTER — Telehealth: Payer: Self-pay | Admitting: Cardiovascular Disease

## 2023-03-31 ENCOUNTER — Encounter: Payer: Self-pay | Admitting: Cardiovascular Disease

## 2023-03-31 NOTE — Telephone Encounter (Signed)
Had to dig back in chart to see when patient was on metoprolol (2018). Looks like Dr Gaspar Bidding had switched her to carvedilol due to chances of it increasing musculoskeletal pain (note attached below). Pt writing in now stating she feels like it's worsening her SOB. No Asthma/COPD on chart which beta blockers can worsen. Will route to [place] for thoughts.  Jacqlyn Krauss, RN     11/11/16  5:41 PM Note     Essential hypertension (Chronic)         Recently she has had Toprol added to her regimen.  Surprisingly up-to-date does report that metoprolol does have an undisclosed frequency of musculoskeletal pain suspect this may be a contributing factor.  Given her blood pressure is still above goal I did take the liberty to have her discontinue metoprolol and began her on Coreg which does not have this associated side effects reported and may give some additional alpha blockade.  Medication compliance emphasized with patient today and she voices understanding and is agreeable to this plan.  I will plan to let Dr. Excell Seltzer know of this change and if any disagreement I will defer to his expertise but do think the significant onset of her symptoms seem to correlate strongly with starting his new medicine.

## 2023-03-31 NOTE — Telephone Encounter (Signed)
Pt daughter called in stating pt is having a hard time taking in a deep breath and she states she can hear her wheezing. She states she is swelling in her feet and legs, but not any more than usual. She states she thinks this occurred when she was on metoprolol before and thats why she was switched to carvedilol. During her hospital stay they changed her back to metoprolol. She wants to make Dr. Excell Seltzer aware. She is also already scheduled for 04/03/23.

## 2023-04-01 ENCOUNTER — Ambulatory Visit: Payer: Self-pay | Admitting: Adult Health

## 2023-04-01 ENCOUNTER — Telehealth: Payer: Self-pay | Admitting: Cardiovascular Disease

## 2023-04-01 ENCOUNTER — Encounter: Payer: Self-pay | Admitting: Cardiovascular Disease

## 2023-04-01 NOTE — Telephone Encounter (Addendum)
 Copied from CRM [id number]. Topic: Clinical - Red Word Triage >> Apr 01, 2023 10:33 AM Corean R wrote: Breathing trouble along with cold symptoms.    Patient had already been triaged earlier by a previous Nurse who spoke with the patient's daughter.  Patient called back and spoke with this RN.  It was noted that the patient's daughter was also on a phone attempting to speak with another Nurse.  Patient was advised that there were no appointments available in the office and for her to be seen today the Emergency Room or an Urgent Care would be the best options.  Patient stated that she was going to hang up and let her daughter speak with someone as well on the different line. Patient verbalized understanding that she would have to go to an Emergency Room due to no appointments but she wanted her daughter to also to talk to someone.

## 2023-04-01 NOTE — Telephone Encounter (Signed)
Drenda Freeze from [facility] was requesting for verbal orders for the patient to have Palliative Care. Drenda Freeze requested we call the number [phone] and ask for Drenda Freeze. Please advise.

## 2023-04-01 NOTE — Telephone Encounter (Signed)
   Chief Complaint: shortness of breath Symptoms: shortness of breath, fatigue, decreased appetite Frequency: since the end of last week Pertinent Negatives: Patient denies fever, chest pain Disposition: [] ED /[x] Urgent Care (no appt availability in office) / [] Appointment(In office/virtual)/ []  [place] [facility]/ [] Home Care/ [] Refused Recommended Disposition /[] [place] [facility]/ []  Follow-up with PCP Additional Notes: Patient's daughter Darice called in reporting that patient has been experiencing SOB, fatigue, and decreased appetite since the end of last week.  Daughter is concerned that patient may be developing pneumonia but is unsure as patient has a history of CHF and often has SOB at baseline, though it has been worse than normal. Currently no appt's available in office until 1/2. Upon offering UC appt, the line became disconnected. This RN attempted to call daughter back in attempt to schedule UC appt. Received VMB and LVM to return call in order to schedule and review care advice. Will continue to attempt.   Copied from CRM [id number]. Topic: Clinical - Red Word Triage >> Apr 01, 2023  9:51 AM Macario HERO wrote: Red Word that prompted transfer to Nurse Triage: Tired, weak, shortness of breath, decreased appetite. Reason for Disposition  [1] Longstanding difficulty breathing (e.g., CHF, COPD, emphysema) AND [2] WORSE than normal  Answer Assessment - Initial Assessment Questions 1. RESPIRATORY STATUS: Describe your breathing? (e.g., wheezing, shortness of breath, unable to speak, severe coughing)      Shortness of breath 2. ONSET: When did this breathing problem begin?      End of last week 3. PATTERN Does the difficult breathing come and go, or has it been constant since it started?      constant 4. SEVERITY: How bad is your breathing? (e.g., mild, moderate, severe)    - MILD: No SOB at rest, mild SOB with walking, speaks normally in sentences, can lie down, no  retractions, pulse < 100.    - MODERATE: SOB at rest, SOB with minimal exertion and prefers to sit, cannot lie down flat, speaks in phrases, mild retractions, audible wheezing, pulse 100-120.    - SEVERE: Very SOB at rest, speaks in single words, struggling to breathe, sitting hunched forward, retractions, pulse > 120      moderate 5. RECURRENT SYMPTOM: Have you had difficulty breathing before? If Yes, ask: When was the last time? and What happened that time?      Sometimes she gets SOB from CHF 6. CARDIAC HISTORY: Do you have any history of heart disease? (e.g., heart attack, angina, bypass surgery, angioplasty)      CHF 7. LUNG HISTORY: Do you have any history of lung disease?  (e.g., pulmonary embolus, asthma, emphysema)     none 8. CAUSE: What do you think is causing the breathing problem?      I'm not sure 9. OTHER SYMPTOMS: Do you have any other symptoms? (e.g., dizziness, runny nose, cough, chest pain, fever)     Fatigue, decreased appetite 10. O2 SATURATION MONITOR:  Do you use an oxygen saturation monitor (pulse oximeter) at home? If Yes, ask: What is your reading (oxygen level) today? What is your usual oxygen saturation reading? (e.g., 95%)       Currently on 2L at 92%, but will dip when she walks  Protocols used: Breathing Difficulty-A-AH

## 2023-04-01 NOTE — Telephone Encounter (Signed)
Lm to call back ./cy 

## 2023-04-01 NOTE — Telephone Encounter (Signed)
Spoke with pt;'s daughter and is going to try and get pt seen by PCP today .Zack Seal

## 2023-04-01 NOTE — Telephone Encounter (Signed)
 Attempted to reach karen again, but no answer. Called and talked to Murphy directly who states Darice is at work. She verbalized understanding and read-back directions to increase Torsemide  from once daily to twice daily (total 80mg ) for next 2 days until we see her.

## 2023-04-01 NOTE — Progress Notes (Unsigned)
 Spoke with patient's daughter.  Progressive dyspnea noted.  She is scheduled to see me in 48 hours.  Suspect her symptoms are related to worsening heart failure.  Patient's care is moving in a palliative direction and they have contacted hospice.  In the meantime, I recommended that she take an additional 40 mg of torsemide  today followed by an increased dose of 60 mg daily beginning tomorrow morning.  She will also hold her beta-blocker as her blood pressure has been low.  I will assess her at the time of her office visit on January 2.  ER precautions given.  Perley Arthurs 04/01/2023 5:03 PM

## 2023-04-01 NOTE — Telephone Encounter (Signed)
 Suspect she is having worsening heart failure.  Would increase torsemide  to 40 mg twice daily until she sees me in the office as scheduled on January 2.  I called Darice and left a message to call me back but I did not leave specific medication instructions on her voicemail.

## 2023-04-02 DIAGNOSIS — N184 Chronic kidney disease, stage 4 (severe): Secondary | ICD-10-CM | POA: Diagnosis not present

## 2023-04-02 DIAGNOSIS — I130 Hypertensive heart and chronic kidney disease with heart failure and stage 1 through stage 4 chronic kidney disease, or unspecified chronic kidney disease: Secondary | ICD-10-CM | POA: Diagnosis not present

## 2023-04-02 DIAGNOSIS — E785 Hyperlipidemia, unspecified: Secondary | ICD-10-CM | POA: Diagnosis not present

## 2023-04-02 DIAGNOSIS — E1159 Type 2 diabetes mellitus with other circulatory complications: Secondary | ICD-10-CM | POA: Diagnosis not present

## 2023-04-02 DIAGNOSIS — E1122 Type 2 diabetes mellitus with diabetic chronic kidney disease: Secondary | ICD-10-CM | POA: Diagnosis not present

## 2023-04-02 DIAGNOSIS — I5033 Acute on chronic diastolic (congestive) heart failure: Secondary | ICD-10-CM | POA: Diagnosis not present

## 2023-04-03 ENCOUNTER — Ambulatory Visit: Payer: Medicare Other | Attending: Cardiovascular Disease | Admitting: Cardiovascular Disease

## 2023-04-03 ENCOUNTER — Encounter: Payer: Self-pay | Admitting: Cardiovascular Disease

## 2023-04-03 VITALS — BP 112/66 | HR 102 | Ht 59.0 in

## 2023-04-03 DIAGNOSIS — Z79899 Other long term (current) drug therapy: Secondary | ICD-10-CM | POA: Diagnosis not present

## 2023-04-03 DIAGNOSIS — N184 Chronic kidney disease, stage 4 (severe): Secondary | ICD-10-CM | POA: Insufficient documentation

## 2023-04-03 DIAGNOSIS — I5033 Acute on chronic diastolic (congestive) heart failure: Secondary | ICD-10-CM | POA: Diagnosis not present

## 2023-04-03 DIAGNOSIS — I359 Nonrheumatic aortic valve disorder, unspecified: Secondary | ICD-10-CM | POA: Insufficient documentation

## 2023-04-03 DIAGNOSIS — I25119 Atherosclerotic heart disease of native coronary artery with unspecified angina pectoris: Secondary | ICD-10-CM | POA: Insufficient documentation

## 2023-04-03 MED ORDER — FUROSEMIDE 40 MG PO TABS: ORAL_TABLET | ORAL | 0 refills | Status: DC

## 2023-04-03 NOTE — Telephone Encounter (Signed)
 Verbal okay from Sara Garza today to place palliative care referral. Attempted to reach [place], but no answer and no direct extension provided. Will re-attempt in the morning.

## 2023-04-03 NOTE — Progress Notes (Signed)
 Cardiology Office Note:    Date:  04/03/2023   ID:  Sara Garza, DOB Dec 21, 1923, MRN [id number]  PCP:  Merna Huxley, NP   [facility] Providers Cardiologist:  Ozell Fell, MD     Referring MD: Merna Huxley, NP   Chief Complaint  Patient presents with   Shortness of Breath    History of Present Illness:    Sara Garza is a 89 y.o. female presenting for hospital follow-up after recent admission for heart failure.  She has a longstanding history of chronic diastolic heart failure, coronary artery disease, and aortic stenosis status post remote [name] in 2014.  She was hospitalized November 16 through March 02, 2023.  An updated echo showed LVEF of 50 to 55% mild to moderate MR with severe mitral stenosis and stable [name] function.  The patient was diuresed aggressively and switched from furosemide  to torsemide .  She was seen by palliative care and referred for outpatient hospice.  Her daughter, Darice, called early this week as the patient had worsening shortness of breath.  I recommended that she double her torsemide  until her follow-up visit today.  The patient is here with her daughter today.  She continues to really struggle with fatigue, orthopnea, PND, and shortness of breath with low-level activity.  Her legs are swollen again.  She has not responded very well to increasing doses of torsemide  and states that she really has not appreciated a big increase in her urine output.  We had a lengthy discussion today about the progression of her heart failure and limited treatment options.  We discussed palliative therapy and hospice in detail today.   Current Medications: Current Meds  Medication Sig   acetaminophen  (TYLENOL ) 325 MG tablet Take 2 tablets (650 mg total) by mouth every 6 (six) hours as needed for moderate pain (pain score 4-6) or mild pain (pain score 1-3).   amoxicillin  (AMOXIL ) 500 MG capsule TAKE 4 CAPSULES BY MOUTH 1 HOUR PRIOR TO DENTAL PROCEDURES    Calcium  Carb-Cholecalciferol 500-400 MG-UNIT TABS Take 1 tablet by mouth daily.   ezetimibe  (ZETIA ) 10 MG tablet Take 1 tablet (10 mg total) by mouth daily.   furosemide  (LASIX ) 40 MG tablet Take 2 tablets (80mg ) by mouth daily for 3 days, then decrease to 1 tablet daily thereafter   Multiple Vitamin (MULTIVITAMIN) tablet Take 1 tablet by mouth daily.   Multiple Vitamins-Minerals (PRESERVISION AREDS 2+MULTI VIT PO) Take 2 capsules by mouth in the morning and at bedtime.   polyethylene glycol (MIRALAX  / GLYCOLAX ) 17 g packet Take 17 g by mouth daily as needed for mild constipation.   [DISCONTINUED] Torsemide  40 MG TABS Take 40 mg by mouth daily.     Allergies:   Statins and Gabapentin    ROS:   Please see the history of present illness.    All other systems reviewed and are negative.  EKGs/Labs/Other Studies Reviewed:    The following studies were reviewed today: Cardiac Studies & Procedures      ECHOCARDIOGRAM  ECHOCARDIOGRAM COMPLETE 02/16/2023  Narrative ECHOCARDIOGRAM REPORT    Patient Name:   Sara Garza Date of Exam: 02/16/2023 Medical Rec #:  [id number]      Height:       59.0 in Accession #:    [id number]     Weight:       127.0 lb Date of Birth:  03/22/1924       BSA:          1.521 m  Patient Age:    89 years       BP:           130/70 mmHg Patient Gender: F              HR:           92 bpm. Exam Location:  Inpatient  Procedure: 2D Echo, Cardiac Doppler and Color Doppler  STAT ECHO  Indications:    acute systolic chf.  History:        Patient has prior history of Echocardiogram examinations, most recent 04/15/2022. Chronic kidney disease, Arrythmias:LBBB; Risk Factors:Hypertension and Dyslipidemia. Aortic Valve: 23 mm Medtronic stented ([name]) valve is present in the aortic position. Procedure Date: 2014.  Sonographer:    Tinnie Barefoot RDCS Referring Phys: [id number] OLE ONEIDA HOLTS   Sonographer Comments: Image acquisition challenging due to respiratory  motion. IMPRESSIONS   1. Left ventricular ejection fraction, by estimation, is 50 to 55%. The left ventricle has low normal function. The left ventricle demonstrates regional wall motion abnormalities- aneurysmal mid ventricular septum with no mural thrombus and stable apperance. There is moderate left ventricular hypertrophy. Left ventricular diastolic parameters are indeterminate. 2. Right ventricular systolic function is normal. The right ventricular size is normal. There is mildly elevated pulmonary artery systolic pressure. The estimated right ventricular systolic pressure is 43.8 mmHg. 3. Left atrial size was severely dilated. 4. The mitral valve is degenerative. Mild to moderate mitral valve regurgitation. Severe mitral stenosis. The mean mitral valve gradient is 13.8 mmHg with average heart rate of 89 bpm. Severe mitral annular calcification. 5. The aortic valve has been repaired/replaced. Aortic valve regurgitation is mild. There is a 23 mm Medtronic stented ([name]) valve present in the aortic position. Procedure Date: 2014. Aortic valve area, by VTI measures 1.27 cm. Aortic valve mean gradient measures 18.5 mmHg. Aortic valve Vmax measures 2.86 m/s. Aortic valve acceleration time measures 79 msec, AV prosthesis is grossly stable, DVI 0.37. 6. The inferior vena cava is normal in size with <50% respiratory variability, suggesting right atrial pressure of 8 mmHg.  FINDINGS Left Ventricle: Left ventricular ejection fraction, by estimation, is 50 to 55%. The left ventricle has low normal function. The left ventricle demonstrates regional wall motion abnormalities. The left ventricular internal cavity size was normal in size. There is moderate left ventricular hypertrophy. Left ventricular diastolic parameters are indeterminate.   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  Right Ventricle: The right ventricular size is normal. No increase in right ventricular wall  thickness. Right ventricular systolic function is normal. There is mildly elevated pulmonary artery systolic pressure. The tricuspid regurgitant velocity is 2.99 m/s, and with an assumed right atrial pressure of 8 mmHg, the estimated right ventricular systolic pressure is 43.8 mmHg.  Left Atrium: Left atrial size was severely dilated.  Right Atrium: Right atrial size was normal in size.  Pericardium: Trivial pericardial effusion is present. Presence of epicardial fat layer.  Mitral Valve: The mitral valve is degenerative in appearance. Severe mitral annular calcification. Mild to moderate mitral valve regurgitation. Severe mitral valve stenosis. MV peak gradient, 27.4 mmHg. The mean mitral valve gradient is 13.8 mmHg with average heart rate of 89 bpm.  Tricuspid Valve: The tricuspid valve is normal in structure. Tricuspid valve regurgitation is trivial. No evidence of tricuspid stenosis.  Aortic Valve: The aortic valve has been repaired/replaced. Aortic valve regurgitation is mild. Aortic valve mean gradient measures 18.5 mmHg. Aortic valve peak gradient measures 32.7 mmHg. Aortic valve  area, by VTI measures 1.27 cm. There is a 23 mm Medtronic stented ([name]) valve present in the aortic position. Procedure Date: 2014.  Pulmonic Valve: The pulmonic valve was normal in structure. Pulmonic valve regurgitation is mild. No evidence of pulmonic stenosis.  Aorta: The aortic root is normal in size and structure.  Venous: The inferior vena cava is normal in size with less than 50% respiratory variability, suggesting right atrial pressure of 8 mmHg.  IAS/Shunts: The atrial septum is grossly normal.   LEFT VENTRICLE PLAX 2D LVIDd:         5.10 cm LVIDs:         3.80 cm LV PW:         1.10 cm LV IVS:        1.10 cm LVOT diam:     2.10 cm LV SV:         71 LV SV Index:   47 LVOT Area:     3.46 cm   RIGHT VENTRICLE             IVC RV Basal diam:  2.70 cm     IVC diam: 1.90 cm RV S prime:      10.30 cm/s TAPSE (M-mode): 1.4 cm  LEFT ATRIUM              Index        RIGHT ATRIUM           Index LA diam:        4.80 cm  3.16 cm/m   RA Area:     13.20 cm LA Vol (A2C):   89.6 ml  58.93 ml/m  RA Volume:   31.50 ml  20.72 ml/m LA Vol (A4C):   123.0 ml 80.89 ml/m LA Biplane Vol: 113.0 ml 74.31 ml/m AORTIC VALVE AV Area (Vmax):    1.30 cm AV Area (Vmean):   1.18 cm AV Area (VTI):     1.27 cm AV Vmax:           285.76 cm/s AV Vmean:          216.081 cm/s AV VTI:            0.562 m AV Peak Grad:      32.7 mmHg AV Mean Grad:      18.5 mmHg LVOT Vmax:         107.25 cm/s LVOT Vmean:        73.575 cm/s LVOT VTI:          0.206 m LVOT/AV VTI ratio: 0.37  AORTA Ao Root diam: 2.80 cm Ao Asc diam:  2.80 cm  MITRAL VALVE              TRICUSPID VALVE MV Peak grad: 27.4 mmHg   TR Peak grad:   35.8 mmHg MV Mean grad: 13.8 mmHg   TR Vmax:        299.00 cm/s MV Vmax:      2.62 m/s MV Vmean:     180.5 cm/s  SHUNTS MR Peak grad: 59.5 mmHg   Systemic VTI:  0.21 m MR Mean grad: 44.0 mmHg   Systemic Diam: 2.10 cm MR Vmax:      385.67 cm/s MR Vmean:     267.0 cm/s  Soyla Merck MD Electronically signed by Soyla Merck MD Signature Date/Time: 02/16/2023/6:08:12 PM    Final             EKG:        Recent  Labs: 02/20/2023: B Natriuretic Peptide 1,358.0 02/27/2023: ALT 13; Hemoglobin 8.7; Magnesium 2.5; Platelets 203 03/20/2023: BUN 64; Creatinine, Ser 2.04; Potassium 4.5; Sodium 141  Recent Lipid Panel    Component Value Date/Time   CHOL 209 (H) 02/19/2022 1034   TRIG 220.0 (H) 02/19/2022 1034   HDL 44.90 02/19/2022 1034   CHOLHDL 5 02/19/2022 1034   VLDL 44.0 (H) 02/19/2022 1034   LDLCALC 133 (H) 12/01/2019 1014   LDLDIRECT 139.0 02/19/2022 1034     Risk Assessment/Calculations:                Physical Exam:    VS:  BP 112/66   Pulse (!) 102   Ht 4' 11 (1.499 m)   SpO2 94%   BMI 24.71 kg/m     Wt Readings from Last 3 Encounters:   03/01/23 122 lb 5.7 oz (55.5 kg)  01/21/23 128 lb (58.1 kg)  12/30/22 128 lb (58.1 kg)     GEN: Pleasant elderly woman in no acute distress HEENT: Normal NECK: No JVD; No carotid bruits LYMPHATICS: No lymphadenopathy CARDIAC: RRR, tachycardic and regular with a 2/6 systolic murmur best heard at the left lower sternal border RESPIRATORY:  Clear to auscultation without rales, wheezing or rhonchi  ABDOMEN: Soft, non-tender, non-distended MUSCULOSKELETAL: 2+ bilateral ankle and pretibial edema; No deformity  SKIN: Warm and dry NEUROLOGIC:  Alert and oriented x 3 PSYCHIATRIC:  Normal affect   Assessment & Plan Acute on chronic heart failure with preserved ejection fraction (HCC) The patient has worsening symptoms of heart failure despite maximal medical therapy and recent hospitalization for IV diuresis.  She has very advanced age and progressive decline in functional status related to her heart failure.  She now clearly has class IV symptoms.  I do not think there is much else we have to offer her from a medical standpoint.  I am going to change her back to furosemide  since it seems like she responded better to this compared to torsemide .  I will check a metabolic panel today.  I really think she would benefit from a palliative approach to her care and home hospice will help her significantly with her symptom control, shortness of breath, and anxiety.  A verbal order is placed.  She has been incredibly tough and resilient through the course of her worsening heart failure and I think she is very much ready for a palliative approach. Aortic valve disease Stable, most recent echo reviewed Coronary artery disease involving native coronary artery of native heart with angina pectoris (HCC) She can stop aspirin  at this point.  Otherwise continue her current medical management. Chronic kidney disease, stage IV (severe) (HCC) Check labs today Medication management Changed from torsemide  to  furosemide .            Medication Adjustments/Labs and Tests Ordered: Current medicines are reviewed at length with the patient today.  Concerns regarding medicines are outlined above.  Orders Placed This Encounter  Procedures   Basic metabolic panel   Meds ordered this encounter  Medications   furosemide  (LASIX ) 40 MG tablet    Sig: Take 2 tablets (80mg ) by mouth daily for 3 days, then decrease to 1 tablet daily thereafter    Dispense:  90 tablet    Refill:  0    Patient Instructions  Medication Instructions:  STOP Torsemide  START Furosemide  80mg  daily for 3 days, then decrease to 40mg  daily thereafter *If you need a refill on your cardiac medications before your next appointment, please  call your pharmacy*  Lab Work: BMET today If you have labs (blood work) drawn today and your tests are completely normal, you will receive your results only by: MyChart Message (if you have MyChart) OR A paper copy in the mail If you have any lab test that is abnormal or we need to change your treatment, we will call you to review the results.  Testing/Procedures: Hospice/palliative Care referral placed   Follow-Up: At [facility], you and your health needs are our priority.  As part of our continuing mission to provide you with exceptional heart care, we have created designated Provider Care Teams.  These Care Teams include your primary Cardiologist (physician) and Advanced Practice Providers (APPs -  Physician Assistants and Nurse Practitioners) who all work together to provide you with the care you need, when you need it.  Your next appointment:   As Needed  Provider:   Ozell Fell, MD        Signed, Ozell Fell, MD  04/03/2023 5:09 PM    [facility]

## 2023-04-03 NOTE — Assessment & Plan Note (Signed)
 She can stop aspirin at this point.  Otherwise continue her current medical management.

## 2023-04-03 NOTE — Patient Instructions (Signed)
 Medication Instructions:  STOP Torsemide  START Furosemide  80mg  daily for 3 days, then decrease to 40mg  daily thereafter *If you need a refill on your cardiac medications before your next appointment, please call your pharmacy*  Lab Work: BMET today If you have labs (blood work) drawn today and your tests are completely normal, you will receive your results only by: MyChart Message (if you have MyChart) OR A paper copy in the mail If you have any lab test that is abnormal or we need to change your treatment, we will call you to review the results.  Testing/Procedures: Hospice/palliative Care referral placed   Follow-Up: At [facility], you and your health needs are our priority.  As part of our continuing mission to provide you with exceptional heart care, we have created designated Provider Care Teams.  These Care Teams include your primary Cardiologist (physician) and Advanced Practice Providers (APPs -  Physician Assistants and Nurse Practitioners) who all work together to provide you with the care you need, when you need it.  Your next appointment:   As Needed  Provider:   Ozell Fell, MD

## 2023-04-04 DIAGNOSIS — I130 Hypertensive heart and chronic kidney disease with heart failure and stage 1 through stage 4 chronic kidney disease, or unspecified chronic kidney disease: Secondary | ICD-10-CM | POA: Diagnosis not present

## 2023-04-04 DIAGNOSIS — I5033 Acute on chronic diastolic (congestive) heart failure: Secondary | ICD-10-CM | POA: Diagnosis not present

## 2023-04-04 DIAGNOSIS — E785 Hyperlipidemia, unspecified: Secondary | ICD-10-CM | POA: Diagnosis not present

## 2023-04-04 DIAGNOSIS — E1122 Type 2 diabetes mellitus with diabetic chronic kidney disease: Secondary | ICD-10-CM | POA: Diagnosis not present

## 2023-04-04 DIAGNOSIS — N184 Chronic kidney disease, stage 4 (severe): Secondary | ICD-10-CM | POA: Diagnosis not present

## 2023-04-04 DIAGNOSIS — E1159 Type 2 diabetes mellitus with other circulatory complications: Secondary | ICD-10-CM | POA: Diagnosis not present

## 2023-04-04 LAB — BASIC METABOLIC PANEL
BUN/Creatinine Ratio: 36 — ABNORMAL HIGH (ref 12–28)
BUN: 77 mg/dL (ref 10–36)
CO2: 25 mmol/L (ref 20–29)
Calcium: 9.3 mg/dL (ref 8.7–10.3)
Chloride: 102 mmol/L (ref 96–106)
Creatinine, Ser: 2.12 mg/dL — ABNORMAL HIGH (ref 0.57–1.00)
Glucose: 123 mg/dL — ABNORMAL HIGH (ref 70–99)
Potassium: 4.6 mmol/L (ref 3.5–5.2)
Sodium: 144 mmol/L (ref 134–144)
eGFR: 21 mL/min/{1.73_m2} — ABNORMAL LOW (ref >59)

## 2023-04-04 NOTE — Telephone Encounter (Signed)
 Reached out today to [place]. No answer. Left detailed message approving order for palliative care. Asked that she call back if more is needed and direct number provided.

## 2023-04-07 ENCOUNTER — Encounter: Payer: Self-pay | Admitting: Adult Health

## 2023-04-07 DIAGNOSIS — I5033 Acute on chronic diastolic (congestive) heart failure: Secondary | ICD-10-CM | POA: Diagnosis not present

## 2023-04-07 DIAGNOSIS — N184 Chronic kidney disease, stage 4 (severe): Secondary | ICD-10-CM | POA: Diagnosis not present

## 2023-04-07 DIAGNOSIS — E1122 Type 2 diabetes mellitus with diabetic chronic kidney disease: Secondary | ICD-10-CM | POA: Diagnosis not present

## 2023-04-07 DIAGNOSIS — E785 Hyperlipidemia, unspecified: Secondary | ICD-10-CM | POA: Diagnosis not present

## 2023-04-07 DIAGNOSIS — E1159 Type 2 diabetes mellitus with other circulatory complications: Secondary | ICD-10-CM | POA: Diagnosis not present

## 2023-04-07 DIAGNOSIS — I130 Hypertensive heart and chronic kidney disease with heart failure and stage 1 through stage 4 chronic kidney disease, or unspecified chronic kidney disease: Secondary | ICD-10-CM | POA: Diagnosis not present

## 2023-04-08 DIAGNOSIS — E785 Hyperlipidemia, unspecified: Secondary | ICD-10-CM | POA: Diagnosis not present

## 2023-04-08 DIAGNOSIS — E1122 Type 2 diabetes mellitus with diabetic chronic kidney disease: Secondary | ICD-10-CM | POA: Diagnosis not present

## 2023-04-08 DIAGNOSIS — I130 Hypertensive heart and chronic kidney disease with heart failure and stage 1 through stage 4 chronic kidney disease, or unspecified chronic kidney disease: Secondary | ICD-10-CM | POA: Diagnosis not present

## 2023-04-08 DIAGNOSIS — E1159 Type 2 diabetes mellitus with other circulatory complications: Secondary | ICD-10-CM | POA: Diagnosis not present

## 2023-04-08 DIAGNOSIS — I5033 Acute on chronic diastolic (congestive) heart failure: Secondary | ICD-10-CM | POA: Diagnosis not present

## 2023-04-08 DIAGNOSIS — N184 Chronic kidney disease, stage 4 (severe): Secondary | ICD-10-CM | POA: Diagnosis not present

## 2023-04-09 ENCOUNTER — Other Ambulatory Visit: Payer: Self-pay | Admitting: Adult Health

## 2023-04-09 MED ORDER — ONDANSETRON HCL 4 MG PO TABS: 4.0000 mg | ORAL_TABLET | Freq: Three times a day (TID) | ORAL | 3 refills | Status: DC | PRN

## 2023-04-09 NOTE — Telephone Encounter (Signed)
**Note De-identified  [place] Obfuscation** Please advise 

## 2023-04-10 ENCOUNTER — Inpatient Hospital Stay (HOSPITAL_COMMUNITY)
Admission: EM | Admit: 2023-04-10 | Discharge: 2023-04-17 | DRG: 536 | Disposition: A | Discharge status: Hospice | Payer: Medicare Other | Attending: Internal Medicine | Admitting: Internal Medicine

## 2023-04-10 ENCOUNTER — Emergency Department (HOSPITAL_COMMUNITY): Payer: Medicare Other

## 2023-04-10 ENCOUNTER — Other Ambulatory Visit: Payer: Self-pay

## 2023-04-10 ENCOUNTER — Encounter (HOSPITAL_COMMUNITY): Payer: Self-pay

## 2023-04-10 ENCOUNTER — Other Ambulatory Visit: Payer: Self-pay | Admitting: Adult Health

## 2023-04-10 DIAGNOSIS — Z955 Presence of coronary angioplasty implant and graft: Secondary | ICD-10-CM

## 2023-04-10 DIAGNOSIS — D631 Anemia in chronic kidney disease: Secondary | ICD-10-CM | POA: Diagnosis present

## 2023-04-10 DIAGNOSIS — Z96641 Presence of right artificial hip joint: Secondary | ICD-10-CM | POA: Diagnosis not present

## 2023-04-10 DIAGNOSIS — N179 Acute kidney failure, unspecified: Secondary | ICD-10-CM | POA: Diagnosis not present

## 2023-04-10 DIAGNOSIS — Z7401 Bed confinement status: Secondary | ICD-10-CM | POA: Diagnosis not present

## 2023-04-10 DIAGNOSIS — S72009A Fracture of unspecified part of neck of unspecified femur, initial encounter for closed fracture: Secondary | ICD-10-CM | POA: Diagnosis present

## 2023-04-10 DIAGNOSIS — Z66 Do not resuscitate: Secondary | ICD-10-CM | POA: Diagnosis present

## 2023-04-10 DIAGNOSIS — E875 Hyperkalemia: Secondary | ICD-10-CM | POA: Diagnosis not present

## 2023-04-10 DIAGNOSIS — S8991XA Unspecified injury of right lower leg, initial encounter: Secondary | ICD-10-CM | POA: Diagnosis not present

## 2023-04-10 DIAGNOSIS — I5032 Chronic diastolic (congestive) heart failure: Secondary | ICD-10-CM | POA: Diagnosis present

## 2023-04-10 DIAGNOSIS — Z79899 Other long term (current) drug therapy: Secondary | ICD-10-CM | POA: Diagnosis not present

## 2023-04-10 DIAGNOSIS — Z952 Presence of prosthetic heart valve: Secondary | ICD-10-CM | POA: Diagnosis not present

## 2023-04-10 DIAGNOSIS — K5900 Constipation, unspecified: Secondary | ICD-10-CM | POA: Diagnosis not present

## 2023-04-10 DIAGNOSIS — Y92000 Kitchen of unspecified non-institutional (private) residence as  the place of occurrence of the external cause: Secondary | ICD-10-CM | POA: Diagnosis not present

## 2023-04-10 DIAGNOSIS — Z7189 Other specified counseling: Secondary | ICD-10-CM | POA: Diagnosis not present

## 2023-04-10 DIAGNOSIS — S72001D Fracture of unspecified part of neck of right femur, subsequent encounter for closed fracture with routine healing: Secondary | ICD-10-CM | POA: Diagnosis not present

## 2023-04-10 DIAGNOSIS — W19XXXA Unspecified fall, initial encounter: Secondary | ICD-10-CM | POA: Diagnosis present

## 2023-04-10 DIAGNOSIS — R296 Repeated falls: Secondary | ICD-10-CM | POA: Diagnosis not present

## 2023-04-10 DIAGNOSIS — R0609 Other forms of dyspnea: Secondary | ICD-10-CM | POA: Diagnosis not present

## 2023-04-10 DIAGNOSIS — S728X1A Other fracture of right femur, initial encounter for closed fracture: Secondary | ICD-10-CM | POA: Diagnosis not present

## 2023-04-10 DIAGNOSIS — R000 Tachycardia, unspecified: Secondary | ICD-10-CM | POA: Diagnosis not present

## 2023-04-10 DIAGNOSIS — K2100 Gastro-esophageal reflux disease with esophagitis, without bleeding: Secondary | ICD-10-CM | POA: Diagnosis not present

## 2023-04-10 DIAGNOSIS — I959 Hypotension, unspecified: Secondary | ICD-10-CM | POA: Diagnosis not present

## 2023-04-10 DIAGNOSIS — Z96642 Presence of left artificial hip joint: Secondary | ICD-10-CM | POA: Diagnosis present

## 2023-04-10 DIAGNOSIS — Z7982 Long term (current) use of aspirin: Secondary | ICD-10-CM | POA: Diagnosis not present

## 2023-04-10 DIAGNOSIS — R0902 Hypoxemia: Secondary | ICD-10-CM | POA: Diagnosis not present

## 2023-04-10 DIAGNOSIS — I080 Rheumatic disorders of both mitral and aortic valves: Secondary | ICD-10-CM | POA: Diagnosis present

## 2023-04-10 DIAGNOSIS — S72001S Fracture of unspecified part of neck of right femur, sequela: Secondary | ICD-10-CM | POA: Diagnosis not present

## 2023-04-10 DIAGNOSIS — E785 Hyperlipidemia, unspecified: Secondary | ICD-10-CM | POA: Diagnosis present

## 2023-04-10 DIAGNOSIS — S72001A Fracture of unspecified part of neck of right femur, initial encounter for closed fracture: Principal | ICD-10-CM | POA: Diagnosis present

## 2023-04-10 DIAGNOSIS — M9701XA Periprosthetic fracture around internal prosthetic right hip joint, initial encounter: Secondary | ICD-10-CM | POA: Diagnosis not present

## 2023-04-10 DIAGNOSIS — Z471 Aftercare following joint replacement surgery: Secondary | ICD-10-CM | POA: Diagnosis not present

## 2023-04-10 DIAGNOSIS — M80051S Age-related osteoporosis with current pathological fracture, right femur, sequela: Secondary | ICD-10-CM | POA: Diagnosis not present

## 2023-04-10 DIAGNOSIS — Z888 Allergy status to other drugs, medicaments and biological substances status: Secondary | ICD-10-CM

## 2023-04-10 DIAGNOSIS — H409 Unspecified glaucoma: Secondary | ICD-10-CM | POA: Diagnosis present

## 2023-04-10 DIAGNOSIS — I130 Hypertensive heart and chronic kidney disease with heart failure and stage 1 through stage 4 chronic kidney disease, or unspecified chronic kidney disease: Secondary | ICD-10-CM | POA: Diagnosis present

## 2023-04-10 DIAGNOSIS — N184 Chronic kidney disease, stage 4 (severe): Secondary | ICD-10-CM | POA: Diagnosis present

## 2023-04-10 DIAGNOSIS — Z515 Encounter for palliative care: Secondary | ICD-10-CM | POA: Diagnosis not present

## 2023-04-10 DIAGNOSIS — R110 Nausea: Secondary | ICD-10-CM | POA: Diagnosis not present

## 2023-04-10 DIAGNOSIS — I69891 Dysphagia following other cerebrovascular disease: Secondary | ICD-10-CM | POA: Diagnosis not present

## 2023-04-10 DIAGNOSIS — I050 Rheumatic mitral stenosis: Secondary | ICD-10-CM | POA: Diagnosis not present

## 2023-04-10 DIAGNOSIS — M25551 Pain in right hip: Secondary | ICD-10-CM | POA: Diagnosis not present

## 2023-04-10 DIAGNOSIS — I2510 Atherosclerotic heart disease of native coronary artery without angina pectoris: Secondary | ICD-10-CM | POA: Diagnosis present

## 2023-04-10 DIAGNOSIS — I10 Essential (primary) hypertension: Secondary | ICD-10-CM | POA: Diagnosis not present

## 2023-04-10 DIAGNOSIS — J9601 Acute respiratory failure with hypoxia: Secondary | ICD-10-CM | POA: Diagnosis not present

## 2023-04-10 DIAGNOSIS — I509 Heart failure, unspecified: Secondary | ICD-10-CM | POA: Diagnosis not present

## 2023-04-10 DIAGNOSIS — Z13820 Encounter for screening for osteoporosis: Secondary | ICD-10-CM | POA: Diagnosis not present

## 2023-04-10 DIAGNOSIS — E1122 Type 2 diabetes mellitus with diabetic chronic kidney disease: Secondary | ICD-10-CM | POA: Diagnosis present

## 2023-04-10 DIAGNOSIS — I639 Cerebral infarction, unspecified: Secondary | ICD-10-CM | POA: Diagnosis not present

## 2023-04-10 DIAGNOSIS — S0990XA Unspecified injury of head, initial encounter: Secondary | ICD-10-CM | POA: Diagnosis not present

## 2023-04-10 LAB — BASIC METABOLIC PANEL
Anion gap: 9 (ref 5–15)
BUN: 97 mg/dL — ABNORMAL HIGH (ref 8–23)
CO2: 27 mmol/L (ref 22–32)
Calcium: 8.5 mg/dL — ABNORMAL LOW (ref 8.9–10.3)
Chloride: 101 mmol/L (ref 98–111)
Creatinine, Ser: 2.56 mg/dL — ABNORMAL HIGH (ref 0.44–1.00)
GFR, Estimated: 17 mL/min — ABNORMAL LOW (ref >60)
Glucose, Bld: 100 mg/dL — ABNORMAL HIGH (ref 70–99)
Potassium: 3.8 mmol/L (ref 3.5–5.1)
Sodium: 137 mmol/L (ref 135–145)

## 2023-04-10 LAB — CBC
HCT: 30.6 % — ABNORMAL LOW (ref 36.0–46.0)
Hemoglobin: 9.5 g/dL — ABNORMAL LOW (ref 12.0–15.0)
MCH: 31.5 pg (ref 26.0–34.0)
MCHC: 31 g/dL (ref 30.0–36.0)
MCV: 101.3 fL — ABNORMAL HIGH (ref 80.0–100.0)
Platelets: 197 10*3/uL (ref 150–400)
RBC: 3.02 MIL/uL — ABNORMAL LOW (ref 3.87–5.11)
RDW: 14 % (ref 11.5–15.5)
WBC: 9.3 10*3/uL (ref 4.0–10.5)
nRBC: 0 % (ref 0.0–0.2)

## 2023-04-10 MED ORDER — ONDANSETRON HCL 4 MG/2ML IJ SOLN: 4.0000 mg | Freq: Once | INTRAMUSCULAR | Status: DC

## 2023-04-10 MED ORDER — FENTANYL CITRATE PF 50 MCG/ML IJ SOSY
25.0000 ug | PREFILLED_SYRINGE | INTRAMUSCULAR | Status: DC | PRN
  Administered 2023-04-10 – 2023-04-14 (×13): 25 ug via INTRAVENOUS
  Filled 2023-04-10 (×14): qty 1

## 2023-04-10 MED ORDER — DOCUSATE SODIUM 100 MG PO CAPS
100.0000 mg | ORAL_CAPSULE | Freq: Two times a day (BID) | ORAL | Status: DC
  Administered 2023-04-10 – 2023-04-17 (×10): 100 mg via ORAL
  Filled 2023-04-10 (×13): qty 1

## 2023-04-10 MED ORDER — ONDANSETRON HCL 4 MG PO TABS
4.0000 mg | ORAL_TABLET | Freq: Four times a day (QID) | ORAL | Status: DC | PRN
  Administered 2023-04-15: 4 mg via ORAL
  Filled 2023-04-10: qty 1

## 2023-04-10 MED ORDER — ALBUTEROL SULFATE (2.5 MG/3ML) 0.083% IN NEBU: 2.5000 mg | INHALATION_SOLUTION | RESPIRATORY_TRACT | Status: DC | PRN

## 2023-04-10 MED ORDER — FENTANYL CITRATE PF 50 MCG/ML IJ SOSY: 25.0000 ug | PREFILLED_SYRINGE | Freq: Once | INTRAMUSCULAR | Status: DC

## 2023-04-10 MED ORDER — ENOXAPARIN SODIUM 30 MG/0.3ML IJ SOSY
30.0000 mg | PREFILLED_SYRINGE | INTRAMUSCULAR | Status: DC
  Administered 2023-04-10 – 2023-04-12 (×3): 30 mg via SUBCUTANEOUS
  Filled 2023-04-10 (×3): qty 0.3

## 2023-04-10 MED ORDER — HYDROXYZINE HCL 10 MG PO TABS: 10.0000 mg | ORAL_TABLET | Freq: Every evening | ORAL | 2 refills | Status: DC | PRN

## 2023-04-10 MED ORDER — ONDANSETRON HCL 4 MG/2ML IJ SOLN
4.0000 mg | Freq: Four times a day (QID) | INTRAMUSCULAR | Status: DC | PRN
  Administered 2023-04-10 – 2023-04-13 (×6): 4 mg via INTRAVENOUS
  Filled 2023-04-10 (×6): qty 2

## 2023-04-10 MED ORDER — HYDROXYZINE HCL 10 MG PO TABS: 10.0000 mg | ORAL_TABLET | Freq: Every evening | ORAL | Status: DC | PRN

## 2023-04-10 MED ORDER — MORPHINE SULFATE (PF) 2 MG/ML IV SOLN: 2.0000 mg | INTRAVENOUS | Status: DC | PRN

## 2023-04-10 MED ORDER — OXYCODONE HCL 5 MG PO TABS
5.0000 mg | ORAL_TABLET | ORAL | Status: DC | PRN
  Administered 2023-04-11: 5 mg via ORAL
  Filled 2023-04-10: qty 1

## 2023-04-10 MED ORDER — ACETAMINOPHEN 650 MG RE SUPP: 650.0000 mg | Freq: Four times a day (QID) | RECTAL | Status: DC | PRN

## 2023-04-10 MED ORDER — ACETAMINOPHEN 325 MG PO TABS
650.0000 mg | ORAL_TABLET | Freq: Four times a day (QID) | ORAL | Status: DC | PRN
  Administered 2023-04-11 – 2023-04-12 (×2): 650 mg via ORAL
  Filled 2023-04-10 (×2): qty 2

## 2023-04-10 MED ORDER — POLYETHYLENE GLYCOL 3350 17 G PO PACK
17.0000 g | PACK | Freq: Every day | ORAL | Status: DC | PRN
  Administered 2023-04-12: 17 g via ORAL
  Filled 2023-04-10: qty 1

## 2023-04-10 NOTE — ED Triage Notes (Addendum)
 Patient BIB [facility] from home. Slipped after missing her footing and fell onto her bottom. Hit the bottom of her chin on a walker. Not on a blood thinner. Complaining of right hip pain. History of bilateral hip replacements. 2L [place] baseline for CHF.

## 2023-04-10 NOTE — ED Notes (Signed)
 Placed order of IV team.  Pt has very poor veins.  Pt to be admitted and pt needs meds given IV

## 2023-04-10 NOTE — H&P (Signed)
 History and Physical  Sara Garza FMW:[id number] DOB: 03-Feb-1924 DOA: 04/10/2023  PCP: Merna Huxley, NP   Chief Complaint: Fall at home  HPI: Sara Garza is a 89 y.o. female with medical history significant for CAD, diet-controlled diabetes, aortic stenosis status post [name], heart failure with preserved EF being admitted to the hospital with right periprosthetic hip fracture after mechanical fall today.  Patient states she was walking with her walker in her kitchen at home, where she lives alone with the assistance of home health, when she lost her grip on the walker and her legs gave out.  She did not lose consciousness, did not hit her head.  Workup as below with evidence of right periprosthetic hip fracture.  ER provider discussed with [facility] Dr. Melita who will see in consultation.  Review of Systems: Please see HPI for pertinent positives and negatives. A complete 10 system review of systems are otherwise negative.  Past Medical History:  Diagnosis Date   Arthritis    CAD (coronary artery disease)    Carotid stenosis, left    50-60% stable   Diabetes mellitus    Glaucoma    Hyperlipidemia    Hypertension    Leg pain    ABIs 2/18: normal bilaterally.   Osteoporosis    Valvular heart disease    Aortic Stenosis s/p [name] 2014 // Mitral stenosis // Echo 09/2018: EF > 65, mod LVH, severe focal basal hypertrophy, RVSP 39.8, mild to mod MR, mod to severe MS (mean 9), AVR with mild AI, severe LAE (similar to prior echo)   Past Surgical History:  Procedure Laterality Date   ANOMALOUS PULMONARY VENOUS RETURN REPAIR, TOTAL     APPENDECTOMY     CARDIAC VALVE REPLACEMENT  04/29/2012   Cataracts Bilateral    CESAREAN SECTION     FOOT SURGERY     [name]   HIP ARTHROPLASTY Right 07/31/2013   Procedure: ARTHROPLASTY BIPOLAR HIP;  Surgeon: Donnice JONETTA Car, MD;  Location: [facility];  Service: Orthopedics;  Laterality: Right;   INTRAMEDULLARY (IM) NAIL INTERTROCHANTERIC Left 01/01/2020    Procedure: INTRAMEDULLARY (IM) NAIL INTERTROCHANTRIC;  Surgeon: Fidel Rogue, MD;  Location: [facility];  Service: Orthopedics;  Laterality: Left;   PERCUTANEOUS CORONARY STENT INTERVENTION (PCI-S) N/A 01/02/2012   Procedure: PERCUTANEOUS CORONARY STENT INTERVENTION (PCI-S);  Surgeon: Ozell Fell, MD;  Location: [facility] CATH [facility];  Service: Cardiovascular;  Laterality: N/A;   Social History:  reports that she has never smoked. She has been exposed to tobacco smoke. She has never used smokeless tobacco. She reports current alcohol  use. She reports that she does not use drugs.  Allergies  Allergen Reactions   Statins Other (See Comments)    Muscle aches   Gabapentin  Other (See Comments)    SOMNOLENCE    Family History  Problem Relation Age of Onset   COPD Father    Cancer Father        lung cancer   Lung cancer Other      Prior to Admission medications   Medication Sig Start Date End Date Taking? Authorizing Provider  ondansetron  (ZOFRAN ) 4 MG tablet Take 1 tablet (4 mg total) by mouth every 8 (eight) hours as needed for nausea or vomiting. 04/09/23   Nafziger, Huxley, NP  acetaminophen  (TYLENOL ) 325 MG tablet Take 2 tablets (650 mg total) by mouth every 6 (six) hours as needed for moderate pain (pain score 4-6) or mild pain (pain score 1-3). 03/02/23   Arrien, Elidia Sieving, MD  amoxicillin  (AMOXIL ) 500 MG capsule TAKE 4 CAPSULES BY MOUTH 1 HOUR PRIOR TO DENTAL PROCEDURES 11/27/22   Wonda Sharper, MD  aspirin  EC 81 MG tablet Take 81 mg by mouth daily. Swallow whole. Patient not taking: Reported on 04/03/2023    [provider]  Calcium  Carb-Cholecalciferol 500-400 MG-UNIT TABS Take 1 tablet by mouth daily.    [provider]  ezetimibe  (ZETIA ) 10 MG tablet Take 1 tablet (10 mg total) by mouth daily. 10/04/22   Wonda Sharper, MD  furosemide  (LASIX ) 40 MG tablet Take 2 tablets (80mg ) by mouth daily for 3 days, then decrease to 1 tablet daily thereafter 04/03/23   Cooper, Michael,  MD  hydrOXYzine  (ATARAX ) 10 MG tablet Take 1 tablet (10 mg total) by mouth at bedtime as needed. 04/10/23   Nafziger, Darleene, NP  metoprolol  tartrate (LOPRESSOR ) 25 MG tablet Take 0.5 tablets (12.5 mg total) by mouth 2 (two) times daily. 03/02/23 04/01/23  Arrien, Mauricio Daniel, MD  Multiple Vitamin (MULTIVITAMIN) tablet Take 1 tablet by mouth daily.    [provider]  Multiple Vitamins-Minerals (PRESERVISION AREDS 2+MULTI VIT PO) Take 2 capsules by mouth in the morning and at bedtime.    [provider]  polyethylene glycol (MIRALAX  / GLYCOLAX ) 17 g packet Take 17 g by mouth daily as needed for mild constipation. 03/02/23   Arrien, Elidia Sieving, MD    Physical Exam: BP 119/61 (BP Location: Right Arm)   Pulse (!) 101   Temp 98 F (36.7 C) (Oral)   Resp 17   Ht 4' 11 (1.499 m)   Wt 54.9 kg   SpO2 90%   BMI 24.44 kg/m  General:  Alert, oriented, calm, in no acute distress, pleasant and cooperative, looks younger than her stated age, her daughter and friend are at the bedside Cardiovascular: RRR, no murmurs or rubs, no peripheral edema  Respiratory: clear to auscultation bilaterally, no wheezes, no crackles  Abdomen: soft, nontender, nondistended, normal bowel tones heard  Skin: dry, no rashes  Musculoskeletal: no joint effusions, normal range of motion  Psychiatric: appropriate affect, normal speech  Neurologic: extraocular muscles intact, clear speech, moving all extremities with intact sensorium         Labs on Admission:  Basic Metabolic Panel: Recent Labs  Lab 04/10/23 1556  NA 137  K 3.8  CL 101  CO2 27  GLUCOSE 100*  BUN 97*  CREATININE 2.56*  CALCIUM  8.5*   Liver Function Tests: No results for input(s): AST, ALT, ALKPHOS, BILITOT, PROT, ALBUMIN in the last 168 hours. No results for input(s): LIPASE, AMYLASE in the last 168 hours. No results for input(s): AMMONIA in the last 168 hours. CBC: Recent Labs  Lab 04/10/23 1556  WBC  9.3  HGB 9.5*  HCT 30.6*  MCV 101.3*  PLT 197   Cardiac Enzymes: No results for input(s): CKTOTAL, CKMB, CKMBINDEX, TROPONINI in the last 168 hours. BNP (last 3 results) Recent Labs    02/15/23 1729 02/20/23 1057  BNP 1,486.0* 1,358.0*    ProBNP (last 3 results) No results for input(s): PROBNP in the last 8760 hours.  CBG: No results for input(s): GLUCAP in the last 168 hours.  Radiological Exams on Admission: DG HIP UNILAT WITH PELVIS 2-3 VIEWS RIGHT Result Date: 04/10/2023 CLINICAL DATA:  Status post fall with right hip pain EXAM: DG HIP (WITH OR WITHOUT PELVIS) 3V RIGHT COMPARISON:  Pelvis radiograph dated 12/30/2019, right hip radiographs dated 07/31/2013 FINDINGS: Status post right hip arthroplasty. Hardware appears intact  and well seated. There is an obliquely oriented lucency through the proximal right femoral diaphysis associated with cortical discontinuity. No acute dislocation. Postsurgical changes of left proximal femoral fixation. Old healed fracture deformity of the proximal left femur. Vascular calcifications. Degenerative changes of the partially imaged lumbar spine. IMPRESSION: Minimally displaced right proximal femoral diaphyseal periprosthetic fracture. Electronically Signed   By: Limin  Xu M.D.   On: 04/10/2023 13:36   CT Head Wo Contrast Result Date: 04/10/2023 CLINICAL DATA:  Head trauma, minor (Age >= 65y) EXAM: CT HEAD WITHOUT CONTRAST TECHNIQUE: Contiguous axial images were obtained from the base of the skull through the vertex without intravenous contrast. RADIATION DOSE REDUCTION: This exam was performed according to the departmental dose-optimization program which includes automated exposure control, adjustment of the mA and/or kV according to patient size and/or use of iterative reconstruction technique. COMPARISON:  None Available. FINDINGS: Brain: No evidence of acute infarction, hemorrhage, hydrocephalus, extra-axial collection or mass lesion/mass  effect. Small bilateral cerebellar infarcts. Patchy white matter hypodensities, nonspecific but compatible with chronic microvascular ischemic disease. Vascular: No hyperdense vessel. Skull: No acute fracture. Sinuses/Orbits: Clear sinuses.  No acute orbital findings. Other: No mastoid effusions. IMPRESSION: No evidence of acute intracranial abnormality. Electronically Signed   By: Gilmore GORMAN Molt M.D.   On: 04/10/2023 13:32   Assessment/Plan Sara Garza is a 89 y.o. female with medical history significant for CAD, diet-controlled diabetes, aortic stenosis status post [name], heart failure with preserved EF being admitted to the hospital with right periprosthetic hip fracture after mechanical fall today.   Right periprosthetic hip fracture-after mechanical fall today, CT head without acute intracranial abnormality, no other injury. -Inpatient admission -Pain control with IV fentanyl  due to CKD -N.p.o. after midnight for anticipated possible surgical repair  Acute on CKD stage IV-in the setting of recent aggressive outpatient diuresis.  Given rising creatinine and looking euvolemic on exam, will hold p.o. Lasix  for a day or 2, monitoring renal function.  Heart failure with preserved EF-patient is looking more euvolemic, family agrees that she has improved lower extremity edema after few days of high-dose p.o. Lasix .  DVT prophylaxis: Lovenox      Code Status: Do not attempt resuscitation (DNR) PRE-ARREST INTERVENTIONS DESIRED.  Confirmed the patient and her daughter at the bedside at the time of admission.  She has been visited by palliative care nurse, and is in the process of filling out her MOST form.  Consults called: [facility]  Admission status: The appropriate patient status for this patient is INPATIENT. Inpatient status is judged to be reasonable and necessary in order to provide the required intensity of service to ensure the patient's safety. The patient's presenting symptoms,  physical exam findings, and initial radiographic and laboratory data in the context of their chronic comorbidities is felt to place them at high risk for further clinical deterioration. Furthermore, it is not anticipated that the patient will be medically stable for discharge from the hospital within 2 midnights of admission.    I certify that at the point of admission it is my clinical judgment that the patient will require inpatient hospital care spanning beyond 2 midnights from the point of admission due to high intensity of service, high risk for further deterioration and high frequency of surveillance required  Time spent: 59 minutes  Sara Picazo CHRISTELLA Gail MD [facility] Pager [phone]  If 7PM-7AM, please contact night-coverage [email or web address] Password [email or web address]  04/10/2023, 5:10 PM

## 2023-04-10 NOTE — ED Provider Notes (Signed)
  Physical Exam  BP (!) 111/55 (BP Location: Right Arm)   Pulse 95   Temp 97.6 F (36.4 C) (Oral)   Resp 18   Ht 4' 11 (1.499 m)   Wt 54.9 kg   SpO2 94%   BMI 24.44 kg/m   Physical Exam Limited range of motion of the right hip.  No skin changes.  Palpable DP and PT pulse.  Procedures  Procedures  ED Course / MDM   Clinical Course as of 04/10/23 2250  Thu Apr 10, 2023  1519 CT head negative for acute pathology. [TY]  1519 Hip x-ray with right periprosthetic fracture.  Per patient and patient's daughter hip replacement done by [facility] Dr. Fidel.  Consult has been placed; awaiting call back. Will get basic labs as well.  [TY]  1523 Lives at home alone, has home health.  Fell backwards with walker.  R hip fracture periprosthetic.  [facility] did surgery.  Waiting to hear from ortho.   [JD]  1635 IM nail with Swinteck in 2021.  Repaging orthopedic surgery. [JD]  1655 Discussed with ortho, will admit to medicine. [JD]    Clinical Course User Index [JD] Nicholaus Cassondra DEL, MD [TY] Neysa Caron PARAS, DO   Medical Decision Making Amount and/or Complexity of Data Reviewed Labs: ordered. Radiology: ordered.  Risk Prescription drug management. Decision regarding hospitalization.   Handoff received from prior provider.  Patient with periprosthetic fracture.  Pending orthopedic recommendations.  Discussed with orthopedics, recommends medicine admission.  Discussed with hospitalist and admitted.       Nicholaus Cassondra DEL, MD 04/10/23 2251

## 2023-04-10 NOTE — ED Provider Notes (Signed)
 [place] EMERGENCY DEPARTMENT AT [facility] Provider Note   CSN: [id number] Arrival date & time: 04/10/23  1231     History  Chief Complaint  Patient presents with   Sara Garza is a 89 y.o. female.  This is a 89 year old female presenting emergency department for evaluation after a fall.  Reports mechanical in nature.  [place]lem[place] while she was trying to get up to answer the door.  Fell back onto her bottom/back.  Pain to right hip.  Did not hit her head no LOC.  Denies blood thinners.  No chest pain shortness of breath.  No abdominal pain.  No numbness tingling changes in sensation in extremities.   Fall       Home Medications Prior to Admission medications   Medication Sig Start Date End Date Taking? Authorizing Provider  acetaminophen  (TYLENOL ) 325 MG tablet Take 2 tablets (650 mg total) by mouth every 6 (six) hours as needed for moderate pain (pain score 4-6) or mild pain (pain score 1-3). 03/02/23  Yes Arrien, Elidia Sieving, MD  aspirin  EC 81 MG tablet Take 81 mg by mouth daily. Swallow whole.   Yes [provider]  Calcium  Carb-Cholecalciferol 500-400 MG-UNIT TABS Take 1 tablet by mouth daily.   Yes [provider]  furosemide  (LASIX ) 40 MG tablet Take 2 tablets (80mg ) by mouth daily for 3 days, then decrease to 1 tablet daily thereafter Patient taking differently: Take 40 mg by mouth daily. Take 2 tablets (80mg ) by mouth daily for 3 days, then decrease to 1 tablet daily thereafter 04/03/23  Yes Wonda Sharper, MD  metoprolol  tartrate (LOPRESSOR ) 25 MG tablet Take 0.5 tablets (12.5 mg total) by mouth 2 (two) times daily. 03/02/23 04/11/23 Yes Arrien, Mauricio Daniel, MD  Multiple Vitamin (MULTIVITAMIN) tablet Take 1 tablet by mouth daily.   Yes [provider]  Multiple Vitamins-Minerals (PRESERVISION AREDS 2+MULTI VIT PO) Take 2 capsules by mouth in the morning and at bedtime.   Yes [provider]  ondansetron  (ZOFRAN ) 4  MG tablet Take 1 tablet (4 mg total) by mouth every 8 (eight) hours as needed for nausea or vomiting. 04/09/23  Yes Nafziger, Darleene, NP  polyethylene glycol (MIRALAX  / GLYCOLAX ) 17 g packet Take 17 g by mouth daily as needed for mild constipation. 03/02/23  Yes Arrien, Elidia Sieving, MD  ezetimibe  (ZETIA ) 10 MG tablet Take 1 tablet (10 mg total) by mouth daily. 10/04/22   Wonda Sharper, MD  hydrOXYzine  (ATARAX ) 10 MG tablet Take 1 tablet (10 mg total) by mouth at bedtime as needed. Patient taking differently: Take 10 mg by mouth at bedtime as needed for anxiety or itching. 04/10/23   Nafziger, Darleene, NP      Allergies    Statins and Gabapentin     Review of Systems   Review of Systems  Physical Exam Updated Vital Signs BP (!) 90/54 (BP Location: Left Arm)   Pulse 89   Temp 97.6 F (36.4 C)   Resp 17   Ht 4' 11 (1.499 m)   Wt 54.9 kg   SpO2 97%   BMI 24.44 kg/m  Physical Exam Vitals and nursing note reviewed.  Constitutional:      General: She is not in acute distress.    Appearance: She is not toxic-appearing.  HENT:     Head: Normocephalic and atraumatic.     Mouth/Throat:     Mouth: Mucous membranes are moist.  Eyes:     Conjunctiva/sclera:  Conjunctivae normal.  Cardiovascular:     Rate and Rhythm: Normal rate.     Pulses: Normal pulses.  Pulmonary:     Effort: Pulmonary effort is normal.     Breath sounds: Normal breath sounds.  Abdominal:     General: Abdomen is flat. There is no distension.     Palpations: Abdomen is soft.     Tenderness: There is no abdominal tenderness. There is no guarding or rebound.  Musculoskeletal:     Cervical back: Normal range of motion. No tenderness.     Comments: Patient with no midline spinal tenderness.  Chest wall stable nontender.  Pelvis stable, some tenderness to the right acetabulum/hip.  No other bony tenderness to extremities.  Equal pulses throughout all extremities.  Skin:    Capillary Refill: Capillary refill takes less than  2 seconds.  Neurological:     General: No focal deficit present.     Mental Status: She is alert and oriented to person, place, and time.  Psychiatric:        Mood and Affect: Mood normal.        Behavior: Behavior normal.     ED Results / Procedures / Treatments   Labs (all labs ordered are listed, but only abnormal results are displayed) Labs Reviewed  CBC - Abnormal; Notable for the following components:      Result Value   RBC 3.02 (*)    Hemoglobin 9.5 (*)    HCT 30.6 (*)    MCV 101.3 (*)    All other components within normal limits  BASIC METABOLIC PANEL - Abnormal; Notable for the following components:   Glucose, Bld 100 (*)    BUN 97 (*)    Creatinine, Ser 2.56 (*)    Calcium  8.5 (*)    GFR, Estimated 17 (*)    All other components within normal limits  BASIC METABOLIC PANEL - Abnormal; Notable for the following components:   Glucose, Bld 104 (*)    BUN 103 (*)    Creatinine, Ser 2.51 (*)    Calcium  8.5 (*)    GFR, Estimated 17 (*)    All other components within normal limits  CBC - Abnormal; Notable for the following components:   RBC 2.95 (*)    Hemoglobin 9.2 (*)    HCT 30.1 (*)    MCV 102.0 (*)    All other components within normal limits    EKG None  Radiology DG HIP UNILAT WITH PELVIS 2-3 VIEWS RIGHT Result Date: 04/10/2023 CLINICAL DATA:  Status post fall with right hip pain EXAM: DG HIP (WITH OR WITHOUT PELVIS) 3V RIGHT COMPARISON:  Pelvis radiograph dated 12/30/2019, right hip radiographs dated 07/31/2013 FINDINGS: Status post right hip arthroplasty. Hardware appears intact and well seated. There is an obliquely oriented lucency through the proximal right femoral diaphysis associated with cortical discontinuity. No acute dislocation. Postsurgical changes of left proximal femoral fixation. Old healed fracture deformity of the proximal left femur. Vascular calcifications. Degenerative changes of the partially imaged lumbar spine. IMPRESSION: Minimally  displaced right proximal femoral diaphyseal periprosthetic fracture. Electronically Signed   By: Limin  Xu M.D.   On: 04/10/2023 13:36   CT Head Wo Contrast Result Date: 04/10/2023 CLINICAL DATA:  Head trauma, minor (Age >= 65y) EXAM: CT HEAD WITHOUT CONTRAST TECHNIQUE: Contiguous axial images were obtained from the base of the skull through the vertex without intravenous contrast. RADIATION DOSE REDUCTION: This exam was performed according to the departmental dose-optimization program which includes  automated exposure control, adjustment of the mA and/or kV according to patient size and/or use of iterative reconstruction technique. COMPARISON:  None Available. FINDINGS: Brain: No evidence of acute infarction, hemorrhage, hydrocephalus, extra-axial collection or mass lesion/mass effect. Small bilateral cerebellar infarcts. Patchy white matter hypodensities, nonspecific but compatible with chronic microvascular ischemic disease. Vascular: No hyperdense vessel. Skull: No acute fracture. Sinuses/Orbits: Clear sinuses.  No acute orbital findings. Other: No mastoid effusions. IMPRESSION: No evidence of acute intracranial abnormality. Electronically Signed   By: Gilmore GORMAN Molt M.D.   On: 04/10/2023 13:32    Procedures Procedures    Medications Ordered in ED Medications  ondansetron  (ZOFRAN ) injection 4 mg (has no administration in time range)  fentaNYL  (SUBLIMAZE ) injection 25 mcg (has no administration in time range)  hydrOXYzine  (ATARAX ) tablet 10 mg (has no administration in time range)  polyethylene glycol (MIRALAX  / GLYCOLAX ) packet 17 g (has no administration in time range)  enoxaparin  (LOVENOX ) injection 30 mg (30 mg Subcutaneous Given 04/10/23 2233)  acetaminophen  (TYLENOL ) tablet 650 mg (650 mg Oral Given 04/11/23 0926)    Or  acetaminophen  (TYLENOL ) suppository 650 mg ( Rectal See Alternative 04/11/23 0926)  oxyCODONE  (Oxy IR/ROXICODONE ) immediate release tablet 5 mg (5 mg Oral Given 04/11/23  0926)  docusate sodium  (COLACE) capsule 100 mg (100 mg Oral Given 04/11/23 0926)  ondansetron  (ZOFRAN ) tablet 4 mg ( Oral See Alternative 04/10/23 1853)    Or  ondansetron  (ZOFRAN ) injection 4 mg (4 mg Intravenous Given 04/10/23 1853)  albuterol  (PROVENTIL ) (2.5 MG/3ML) 0.083% nebulizer solution 2.5 mg (has no administration in time range)  fentaNYL  (SUBLIMAZE ) injection 25 mcg (25 mcg Intravenous Given 04/11/23 9061)    ED Course/ Medical Decision Making/ A&P Clinical Course as of 04/11/23 1522  Thu Apr 10, 2023  1519 CT head negative for acute pathology. [TY]  1519 Hip x-ray with right periprosthetic fracture.  Per patient and patient's daughter hip replacement done by [facility] Dr. Fidel.  Consult has been placed; awaiting call back. Will get basic labs as well.  [TY]  1523 Lives at home alone, has home health.  Fell backwards with walker.  R hip fracture periprosthetic.  [facility] did surgery.  Waiting to hear from ortho.   [JD]  1635 IM nail with Swinteck in 2021.  Repaging orthopedic surgery. [JD]  1655 Discussed with ortho, will admit to medicine. [JD]    Clinical Course User Index [JD] Nicholaus Cassondra DEL, MD [TY] Neysa Caron PARAS, DO                                 Medical Decision Making Is a well-appearing 89 year old female presenting emergency department for evaluation after a fall.  She is afebrile vital signs reassuring.  Exam with some tenderness to her right hip, but no obvious deformity or signs of trauma.  Will get CT head given patient's age.  Will also get x-ray of pelvis given pain to right hip. See ED course for further MDM/disposition.   Amount and/or Complexity of Data Reviewed Independent Historian:     Details: Caregiver notes history of bilateral hip replacements External Data Reviewed:     Details: Does not appear to be on blood thinner per chart review Labs: ordered.    Details: Consider labs, however fall mechanical.  Labs unlikely to change management or  disposition at this time Radiology: ordered. Decision-making details documented in ED Course.  Risk Prescription drug management. Decision regarding hospitalization.  Final Clinical Impression(s) / ED Diagnoses Final diagnoses:  Closed fracture of right hip, initial encounter [facility])    Rx / DC Orders ED Discharge Orders     None         Neysa Caron PARAS, DO 04/11/23 1522

## 2023-04-10 NOTE — Consult Note (Signed)
 Reason for Consult: Mechanical fall with right hip pain and periprosthetic hip fracture Referring Physician: EDP  HPI: Sara Garza is an 89 y.o. female well-known to [facility] with remote history of right hip hemiarthroplasty by Dr. Ernie in 2015 and a left hip intramedullary nail in 2021 by Dr. Fidel, presents to the ER today following mechanical fall with complaints of right hip pain and inability to bear weight.  Radiographs confirm mildly displaced right periprosthetic hip fracture.  Patient has a complex and extensive past medical history including CHF for which she was recently November 16 through March 02, 2023.  Subsequent discharged to [facility] nursing facility and more recently back home.  Patient and family have recently had discussions regarding palliative care and hospice given her deteriorating cardiac function and limited treatment options.  Past Medical History:  Diagnosis Date   Arthritis    CAD (coronary artery disease)    Carotid stenosis, left    50-60% stable   Diabetes mellitus    Glaucoma    Hyperlipidemia    Hypertension    Leg pain    ABIs 2/18: normal bilaterally.   Osteoporosis    Valvular heart disease    Aortic Stenosis s/p [name] 2014 // Mitral stenosis // Echo 09/2018: EF > 65, mod LVH, severe focal basal hypertrophy, RVSP 39.8, mild to mod MR, mod to severe MS (mean 9), AVR with mild AI, severe LAE (similar to prior echo)    Past Surgical History:  Procedure Laterality Date   ANOMALOUS PULMONARY VENOUS RETURN REPAIR, TOTAL     APPENDECTOMY     CARDIAC VALVE REPLACEMENT  04/29/2012   Cataracts Bilateral    CESAREAN SECTION     FOOT SURGERY     [name]   HIP ARTHROPLASTY Right 07/31/2013   Procedure: ARTHROPLASTY BIPOLAR HIP;  Surgeon: Donnice JONETTA Ernie, MD;  Location: [facility];  Service: Orthopedics;  Laterality: Right;   INTRAMEDULLARY (IM) NAIL INTERTROCHANTERIC Left 01/01/2020   Procedure: INTRAMEDULLARY (IM) NAIL INTERTROCHANTRIC;  Surgeon:  Fidel Rogue, MD;  Location: [facility];  Service: Orthopedics;  Laterality: Left;   PERCUTANEOUS CORONARY STENT INTERVENTION (PCI-S) N/A 01/02/2012   Procedure: PERCUTANEOUS CORONARY STENT INTERVENTION (PCI-S);  Surgeon: Ozell Fell, MD;  Location: [facility] CATH LAB;  Service: Cardiovascular;  Laterality: N/A;    Family History  Problem Relation Age of Onset   COPD Father    Cancer Father        lung cancer   Lung cancer Other     Social History:  reports that she has never smoked. She has been exposed to tobacco smoke. She has never used smokeless tobacco. She reports current alcohol  use. She reports that she does not use drugs.  Allergies:  Allergies  Allergen Reactions   Statins Other (See Comments)    Muscle aches   Gabapentin  Other (See Comments)    SOMNOLENCE    Medications: Patient's medications as per chart documentation.  Results for orders placed or performed during the hospital encounter of 04/10/23 (from the past 48 hours)  CBC     Status: Abnormal   Collection Time: 04/10/23  3:56 PM  Result Value Ref Range   WBC 9.3 4.0 - 10.5 K/uL   RBC 3.02 (L) 3.87 - 5.11 MIL/uL   Hemoglobin 9.5 (L) 12.0 - 15.0 g/dL   HCT 69.3 (L) 63.9 - 53.9 %   MCV 101.3 (H) 80.0 - 100.0 fL   MCH 31.5 26.0 - 34.0 pg   MCHC 31.0 30.0 - 36.0  g/dL   RDW 85.9 88.4 - 84.4 %   Platelets 197 150 - 400 K/uL   nRBC 0.0 0.0 - 0.2 %    Comment: Performed at [facility], [address] [place], [place] [address]  Basic metabolic panel     Status: Abnormal   Collection Time: 04/10/23  3:56 PM  Result Value Ref Range   Sodium 137 135 - 145 mmol/L   Potassium 3.8 3.5 - 5.1 mmol/L   Chloride 101 98 - 111 mmol/L   CO2 27 22 - 32 mmol/L   Glucose, Bld 100 (H) 70 - 99 mg/dL    Comment: Glucose reference range applies only to samples taken after fasting for at least 8 hours.   BUN 97 (H) 8 - 23 mg/dL    Comment: RESULT CONFIRMED BY MANUAL DILUTION   Creatinine, Ser 2.56 (H) 0.44 -  1.00 mg/dL   Calcium  8.5 (L) 8.9 - 10.3 mg/dL   GFR, Estimated 17 (L) >60 mL/min    Comment: (NOTE) Calculated using the CKD-EPI Creatinine Equation (2021)    Anion gap 9 5 - 15    Comment: Performed at [facility], [address] [place], [place] [address]    DG HIP UNILAT WITH PELVIS 2-3 VIEWS RIGHT Result Date: 04/10/2023 CLINICAL DATA:  Status post fall with right hip pain EXAM: DG HIP (WITH OR WITHOUT PELVIS) 3V RIGHT COMPARISON:  Pelvis radiograph dated 12/30/2019, right hip radiographs dated 07/31/2013 FINDINGS: Status post right hip arthroplasty. Hardware appears intact and well seated. There is an obliquely oriented lucency through the proximal right femoral diaphysis associated with cortical discontinuity. No acute dislocation. Postsurgical changes of left proximal femoral fixation. Old healed fracture deformity of the proximal left femur. Vascular calcifications. Degenerative changes of the partially imaged lumbar spine. IMPRESSION: Minimally displaced right proximal femoral diaphyseal periprosthetic fracture. Electronically Signed   By: Limin  Xu M.D.   On: 04/10/2023 13:36   CT Head Wo Contrast Result Date: 04/10/2023 CLINICAL DATA:  Head trauma, minor (Age >= 65y) EXAM: CT HEAD WITHOUT CONTRAST TECHNIQUE: Contiguous axial images were obtained from the base of the skull through the vertex without intravenous contrast. RADIATION DOSE REDUCTION: This exam was performed according to the departmental dose-optimization program which includes automated exposure control, adjustment of the mA and/or kV according to patient size and/or use of iterative reconstruction technique. COMPARISON:  None Available. FINDINGS: Brain: No evidence of acute infarction, hemorrhage, hydrocephalus, extra-axial collection or mass lesion/mass effect. Small bilateral cerebellar infarcts. Patchy white matter hypodensities, nonspecific but compatible with chronic microvascular ischemic disease.  Vascular: No hyperdense vessel. Skull: No acute fracture. Sinuses/Orbits: Clear sinuses.  No acute orbital findings. Other: No mastoid effusions. IMPRESSION: No evidence of acute intracranial abnormality. Electronically Signed   By: Gilmore GORMAN Molt M.D.   On: 04/10/2023 13:32     Vitals Temp:  [97.6 F (36.4 C)-98 F (36.7 C)] 98 F (36.7 C) (01/09 1656) Pulse Rate:  [101-108] 101 (01/09 1656) Resp:  [17-20] 17 (01/09 1656) BP: (108-119)/(61-62) 119/61 (01/09 1656) SpO2:  [90 %-93 %] 90 % (01/09 1656) Weight:  [54.9 kg] 54.9 kg (01/09 1240) Body mass index is 24.44 kg/m.  Physical Exam: Patient is a pleasant, cooperative, oriented elderly female in obvious discomfort secondary to right hip and thigh pain.  No obvious head or facial trauma.  No clavicular tenderness or crepitance with palpation.  No discrete bone or joint tenderness or instability with palpation of the upper extremities.  Good  grip strength.  No discrete tenderness or instability with palpation of the left lower extremity.  Chronic edema.  Diffuse tenderness with palpation globally about the right hip and thigh with moderate swelling but soft compartments.     Assessment/Plan: Impression:  1.  Minimally displaced right periprosthetic proximal femur fracture  2.  Multiple chronic medical comorbidities including CHF for which she was recently hospitalized and in consideration of her deteriorating cardiac function has recently engaged palliative therapy and hospice.   Plan:  I had a lengthy discussion with the patient as well as her daughter at the bedside this evening regarding the current orthopedic situation as well as various treatment options.  Given her significant underlying medical comorbidities certainly any consideration of surgical intervention would be associated with relatively high potential morbidity and/or mortality.  Family members strongly support conservative management given the overall situation.  I  have had an opportunity to speak with Dr. Fidel, our hip fracture specialist, and share this information with him.  He will be by tomorrow to speak with the patient and family regarding ongoing management.  During the interim continue with bedrest, position for comfort, supportive care, analgesics as needed.    Dala Breault M Akua Blethen 04/10/2023, 6:04 PM  Contact # [phone]

## 2023-04-10 NOTE — Plan of Care (Signed)
  Problem: Education: Goal: Knowledge of General Education information will improve Description: Including pain rating scale, medication(s)/side effects and non-pharmacologic comfort measures 04/10/2023 2328 by Herschel Countryman, RN Outcome: Progressing 04/10/2023 2328 by Herschel Countryman, RN Outcome: Progressing   Problem: Coping: Goal: Level of anxiety will decrease 04/10/2023 2328 by Herschel Countryman, RN Outcome: Progressing 04/10/2023 2328 by Herschel Countryman, RN Outcome: Progressing   Problem: Elimination: Goal: Will not experience complications related to urinary retention 04/10/2023 2328 by Herschel Countryman, RN Outcome: Progressing 04/10/2023 2328 by Herschel Countryman, RN Outcome: Progressing   Problem: Pain Management: Goal: General experience of comfort will improve Outcome: Progressing   Problem: Safety: Goal: Ability to remain free from injury will improve 04/10/2023 2328 by Herschel Countryman, RN Outcome: Progressing 04/10/2023 2328 by Herschel Countryman, RN Outcome: Progressing   Problem: Pain Management: Goal: Pain level will decrease Outcome: Progressing

## 2023-04-11 ENCOUNTER — Encounter: Payer: Self-pay | Admitting: Cardiovascular Disease

## 2023-04-11 DIAGNOSIS — I050 Rheumatic mitral stenosis: Secondary | ICD-10-CM | POA: Diagnosis not present

## 2023-04-11 DIAGNOSIS — S72001A Fracture of unspecified part of neck of right femur, initial encounter for closed fracture: Secondary | ICD-10-CM | POA: Diagnosis not present

## 2023-04-11 LAB — BASIC METABOLIC PANEL
Anion gap: 11 (ref 5–15)
BUN: 103 mg/dL — ABNORMAL HIGH (ref 8–23)
CO2: 25 mmol/L (ref 22–32)
Calcium: 8.5 mg/dL — ABNORMAL LOW (ref 8.9–10.3)
Chloride: 101 mmol/L (ref 98–111)
Creatinine, Ser: 2.51 mg/dL — ABNORMAL HIGH (ref 0.44–1.00)
GFR, Estimated: 17 mL/min — ABNORMAL LOW (ref >60)
Glucose, Bld: 104 mg/dL — ABNORMAL HIGH (ref 70–99)
Potassium: 4.8 mmol/L (ref 3.5–5.1)
Sodium: 137 mmol/L (ref 135–145)

## 2023-04-11 LAB — CBC
HCT: 30.1 % — ABNORMAL LOW (ref 36.0–46.0)
Hemoglobin: 9.2 g/dL — ABNORMAL LOW (ref 12.0–15.0)
MCH: 31.2 pg (ref 26.0–34.0)
MCHC: 30.6 g/dL (ref 30.0–36.0)
MCV: 102 fL — ABNORMAL HIGH (ref 80.0–100.0)
Platelets: 199 10*3/uL (ref 150–400)
RBC: 2.95 MIL/uL — ABNORMAL LOW (ref 3.87–5.11)
RDW: 13.9 % (ref 11.5–15.5)
WBC: 6.2 10*3/uL (ref 4.0–10.5)
nRBC: 0 % (ref 0.0–0.2)

## 2023-04-11 MED ORDER — OXYCODONE HCL 5 MG PO TABS: 2.5000 mg | ORAL_TABLET | ORAL | Status: DC | PRN

## 2023-04-11 NOTE — Plan of Care (Signed)
   Problem: Coping: Goal: Level of anxiety will decrease Outcome: Progressing   Problem: Pain Management: Goal: General experience of comfort will improve Outcome: Progressing   Problem: Safety: Goal: Ability to remain free from injury will improve Outcome: Progressing

## 2023-04-11 NOTE — Progress Notes (Addendum)
 PROGRESS NOTE  Sara Garza FMW:[id number] DOB: June 13, 1923   PCP: Merna Huxley, NP  Patient is from: Home.  Uses rolling walker at baseline.  DOA: 04/10/2023 LOS: 1  Chief complaints Chief Complaint  Patient presents with   Fall     Brief Narrative / Interim history: 89 year old F with PMH of CAD, diet-controlled DM-2,  HFpEF, AS s/p [name], and severe atrial valve stenosis presented to ED with right hip pain and inability to bear weight after she had accidental fall at home.  Reportedly, she was in the kitchen when her walker toppled over and she fell on the right side.  She did not strike her head or lose consciousness.  Workup in ED revealed right periprosthetic hip fracture.  Orthopedic surgery consulted.  Ongoing discussion about plan of care.  Subjective: Seen and examined earlier this morning.  No major events overnight of this morning.  Reports severe pain with movement of his right leg.  Pain is better when she is not moving her leg.  Patient's daughter at bedside.  Objective: Vitals:   04/10/23 2029 04/11/23 0202 04/11/23 0538 04/11/23 0919  BP: (!) 111/55 (!) 102/56 (!) 103/58 (!) 99/53  Pulse: 95 95 94 93  Resp: 18 18 18 18   Temp: 97.6 F (36.4 C)  98.2 F (36.8 C) 98.1 F (36.7 C)  TempSrc: Oral  Oral   SpO2: 94% 92% 94% 95%  Weight:      Height:        Examination:  GENERAL: No apparent distress.  Nontoxic. HEENT: MMM.  Vision and hearing grossly intact.  NECK: Supple.  No apparent JVD.  RESP:  No IWOB.  Fair aeration bilaterally. CVS:  RRR. Heart sounds normal.  ABD/GI/GU: BS+. Abd soft, NTND.  MSK/EXT:  No edema.  Tender over right hip. SKIN: no apparent skin lesion or wound NEURO: Awake, alert and oriented appropriately.  No apparent focal neuro deficit. PSYCH: Calm. Normal affect.   Procedures:  None  Microbiology summarized: None  Assessment and plan: Accidental fall at home-did not strike head or lose consciousness.  CT head without acute  finding. Right periprosthetic hip fracture due to dental fall at home -Orthopedic surgery on board-ongoing discussion about plan of care -Patient no significant cardiac history as above.  May need clearance by cardiology if surgery is pursued -Bedrest and pain control  Chronic HFpEF: Appears compensated. History of aortic stenosis s/p [name] Severe mitral valve stenosis -TTE in 01/2023 with LVEF of 50 to 55%, G1 DD, severe LAE, severe mitral stenosis with mild to moderate MVR -Hold home Lasix  for now. -Strict intake and output   AKI on CKD-4: Baseline Cr ~2.0.  Creatinine seems to have plateaued. Recent Labs    02/23/23 0715 02/24/23 0206 02/25/23 0210 02/26/23 0806 02/27/23 0651 02/28/23 0838 03/20/23 1715 04/03/23 1652 04/10/23 1556 04/11/23 0336  BUN 57* 64* 65* 60* 63* 62* 64* 77* 97* 103*  CREATININE 2.17* 2.07* 1.95* 2.03* 2.11* 1.93* 2.04* 2.12* 2.56* 2.51*  -Hold home Lasix  -Recheck in the morning -Monitor urine output  Anemia of renal disease: H&H stable Recent Labs    02/15/23 1729 02/15/23 2022 02/16/23 1622 02/17/23 0312 02/19/23 0837 02/26/23 0806 02/27/23 0651 04/10/23 1556 04/11/23 0336  HGB 10.8* 13.9 11.0* 9.7* 10.4* 9.4* 8.7* 9.5* 9.2*  -Continue monitoring  Hypotension: Not symptomatic. -Hold home metoprolol  and Lasix  for now.    Body mass index is 24.44 kg/m.          DVT prophylaxis:  enoxaparin  (  LOVENOX ) injection 30 mg Start: 04/10/23 2200 SCDs Start: 04/10/23 1709  Code Status: DNR/DNI Family Communication: Updated patient's daughter at bedside Level of care: Med-Surg Status is: Inpatient Remains inpatient appropriate because: Right periprosthetic femoral fracture   Final disposition: TBD Consultants:  Orthopedic surgery  55 minutes with more than 50% spent in reviewing records, counseling patient/family and coordinating care.   Sch Meds:  Scheduled Meds:  docusate sodium   100 mg Oral BID   enoxaparin  (LOVENOX )  injection  30 mg Subcutaneous Q24H   fentaNYL  (SUBLIMAZE ) injection  25 mcg Intravenous Once   ondansetron  (ZOFRAN ) IV  4 mg Intravenous Once   Continuous Infusions: PRN Meds:.acetaminophen  **OR** acetaminophen , albuterol , fentaNYL  (SUBLIMAZE ) injection, hydrOXYzine , ondansetron  **OR** ondansetron  (ZOFRAN ) IV, oxyCODONE , polyethylene glycol  Antimicrobials: Anti-infectives (From admission, onward)    None        I have personally reviewed the following labs and images: CBC: Recent Labs  Lab 04/10/23 1556 04/11/23 0336  WBC 9.3 6.2  HGB 9.5* 9.2*  HCT 30.6* 30.1*  MCV 101.3* 102.0*  PLT 197 199   BMP &GFR Recent Labs  Lab 04/10/23 1556 04/11/23 0336  NA 137 137  K 3.8 4.8  CL 101 101  CO2 27 25  GLUCOSE 100* 104*  BUN 97* 103*  CREATININE 2.56* 2.51*  CALCIUM  8.5* 8.5*   Estimated Creatinine Clearance: 9.7 mL/min (A) (by C-G formula based on SCr of 2.51 mg/dL (H)). Liver & Pancreas: No results for input(s): AST, ALT, ALKPHOS, BILITOT, PROT, ALBUMIN in the last 168 hours. No results for input(s): LIPASE, AMYLASE in the last 168 hours. No results for input(s): AMMONIA in the last 168 hours. Diabetic: No results for input(s): HGBA1C in the last 72 hours. No results for input(s): GLUCAP in the last 168 hours. Cardiac Enzymes: No results for input(s): CKTOTAL, CKMB, CKMBINDEX, TROPONINI in the last 168 hours. No results for input(s): PROBNP in the last 8760 hours. Coagulation Profile: No results for input(s): INR, PROTIME in the last 168 hours. Thyroid  Function Tests: No results for input(s): TSH, T4TOTAL, FREET4, T3FREE, THYROIDAB in the last 72 hours. Lipid Profile: No results for input(s): CHOL, HDL, LDLCALC, TRIG, CHOLHDL, LDLDIRECT in the last 72 hours. Anemia Panel: No results for input(s): VITAMINB12, FOLATE, FERRITIN, TIBC, IRON, RETICCTPCT in the last 72 hours. Urine analysis:     Component Value Date/Time   COLORURINE YELLOW 09/17/2016 1155   APPEARANCEUR [place] (A) 09/17/2016 1155   LABSPEC 1.025 09/17/2016 1155   PHURINE 5.0 09/17/2016 1155   GLUCOSEU NEGATIVE 09/17/2016 1155   HGBUR NEGATIVE 09/17/2016 1155   BILIRUBINUR NEGATIVE 09/17/2016 1155   BILIRUBINUR n 06/18/2016 1016   KETONESUR 15 (A) 09/17/2016 1155   PROTEINUR n 06/18/2016 1016   PROTEINUR NEGATIVE 06/02/2016 1753   UROBILINOGEN 0.2 09/17/2016 1155   NITRITE NEGATIVE 09/17/2016 1155   LEUKOCYTESUR TRACE (A) 09/17/2016 1155   Sepsis Labs: Invalid input(s): PROCALCITONIN, LACTICIDVEN  Microbiology: No results found for this or any previous visit (from the past 240 hours).  Radiology Studies: No results found.    Sara Garza T. Jamesina Gaugh [facility]  If 7PM-7AM, please contact night-coverage [email or web address] 04/11/2023, 2:35 PM

## 2023-04-12 ENCOUNTER — Encounter (HOSPITAL_COMMUNITY): Payer: Self-pay | Admitting: Internal Medicine

## 2023-04-12 DIAGNOSIS — I050 Rheumatic mitral stenosis: Secondary | ICD-10-CM | POA: Diagnosis not present

## 2023-04-12 DIAGNOSIS — S72001A Fracture of unspecified part of neck of right femur, initial encounter for closed fracture: Secondary | ICD-10-CM | POA: Diagnosis not present

## 2023-04-12 NOTE — Consult Note (Signed)
 Cardiology Consultation   Patient ID: [name] MRN: [id number]; DOB: Dec 31, 1923  Admit date: 04/10/2023 Date of Consult: 04/12/2023  PCP:  Merna Huxley, NP   [facility] Providers Cardiologist:  Ozell Fell, MD   {   Patient Profile:   Sara Garza is a 89 y.o. female with a hx of aortic stenosis and mitral stenosis  who is being seen 04/12/2023 for the evaluation of valvular dz  at the request of Dr . Kathrin  History of Present Illness:   Sara Garza is a very pleasant 89 yo with hx of CAD, T2DM, HFpEF, AS (s/p [name] in 2014) and severe MS. SHe is followed closely by CHRISTELLA Fell in cardiology clinic     I saw her when she was hospitalized for CHF exacerbation in Nov 2024  Echo at that time showed LVEF 50 to 55%   Mild /mod MR, severe MS   Stable [name] function   Pt diuresed.   Palliative care contacted and pt set up for outpt hospice The pt was just seen by CHRISTELLA Fell on 04/03/23   At that visit she continued to complain of fatigue, orthopnea, PND, SOB with little activity    THis was despite increased doses of torsemide   Felt to have Class IV symptoms  Switched back to lasix  .   REcomm was for continued home hospice  The pt was admitted yesterday after a fall with fx of hip       Currently she is in pain from leg   Breathing is OK  NO CP        Past Medical History:  Diagnosis Date   Arthritis    CAD (coronary artery disease)    Carotid stenosis, left    50-60% stable   Diabetes mellitus    Glaucoma    Hyperlipidemia    Hypertension    Leg pain    ABIs 2/18: normal bilaterally.   Osteoporosis    Valvular heart disease    Aortic Stenosis s/p [name] 2014 // Mitral stenosis // Echo 09/2018: EF > 65, mod LVH, severe focal basal hypertrophy, RVSP 39.8, mild to mod MR, mod to severe MS (mean 9), AVR with mild AI, severe LAE (similar to prior echo)    Past Surgical History:  Procedure Laterality Date   ANOMALOUS PULMONARY VENOUS RETURN REPAIR, TOTAL      APPENDECTOMY     CARDIAC VALVE REPLACEMENT  04/29/2012   Cataracts Bilateral    CESAREAN SECTION     FOOT SURGERY     [name]   HIP ARTHROPLASTY Right 07/31/2013   Procedure: ARTHROPLASTY BIPOLAR HIP;  Surgeon: Donnice JONETTA Car, MD;  Location: [facility];  Service: Orthopedics;  Laterality: Right;   INTRAMEDULLARY (IM) NAIL INTERTROCHANTERIC Left 01/01/2020   Procedure: INTRAMEDULLARY (IM) NAIL INTERTROCHANTRIC;  Surgeon: Fidel Rogue, MD;  Location: [facility];  Service: Orthopedics;  Laterality: Left;   PERCUTANEOUS CORONARY STENT INTERVENTION (PCI-S) N/A 01/02/2012   Procedure: PERCUTANEOUS CORONARY STENT INTERVENTION (PCI-S);  Surgeon: Ozell Fell, MD;  Location: [facility] CATH LAB;  Service: Cardiovascular;  Laterality: N/A;     Home Medications:  Prior to Admission medications   Medication Sig Start Date End Date Taking? Authorizing Provider  acetaminophen  (TYLENOL ) 325 MG tablet Take 2 tablets (650 mg total) by mouth every 6 (six) hours as needed for moderate pain (pain score 4-6) or mild pain (pain score 1-3). 03/02/23  Yes Arrien, Elidia Sieving, MD  aspirin  EC 81 MG tablet Take 81  mg by mouth daily. Swallow whole.   Yes [provider]  Calcium  Carb-Cholecalciferol 500-400 MG-UNIT TABS Take 1 tablet by mouth daily.   Yes [provider]  furosemide  (LASIX ) 40 MG tablet Take 2 tablets (80mg ) by mouth daily for 3 days, then decrease to 1 tablet daily thereafter Patient taking differently: Take 40 mg by mouth daily. Take 2 tablets (80mg ) by mouth daily for 3 days, then decrease to 1 tablet daily thereafter 04/03/23  Yes Wonda Sharper, MD  metoprolol  tartrate (LOPRESSOR ) 25 MG tablet Take 0.5 tablets (12.5 mg total) by mouth 2 (two) times daily. 03/02/23 04/11/23 Yes Arrien, Mauricio Daniel, MD  Multiple Vitamin (MULTIVITAMIN) tablet Take 1 tablet by mouth daily.   Yes [provider]  Multiple Vitamins-Minerals (PRESERVISION AREDS 2+MULTI VIT PO) Take 2 capsules by mouth in  the morning and at bedtime.   Yes [provider]  ondansetron  (ZOFRAN ) 4 MG tablet Take 1 tablet (4 mg total) by mouth every 8 (eight) hours as needed for nausea or vomiting. 04/09/23  Yes Nafziger, Darleene, NP  polyethylene glycol (MIRALAX  / GLYCOLAX ) 17 g packet Take 17 g by mouth daily as needed for mild constipation. 03/02/23  Yes Arrien, Elidia Sieving, MD  ezetimibe  (ZETIA ) 10 MG tablet Take 1 tablet (10 mg total) by mouth daily. 10/04/22   Wonda Sharper, MD  hydrOXYzine  (ATARAX ) 10 MG tablet Take 1 tablet (10 mg total) by mouth at bedtime as needed. Patient taking differently: Take 10 mg by mouth at bedtime as needed for anxiety or itching. 04/10/23   Nafziger, Cory, NP    Inpatient Medications: Scheduled Meds:  docusate sodium   100 mg Oral BID   enoxaparin  (LOVENOX ) injection  30 mg Subcutaneous Q24H   fentaNYL  (SUBLIMAZE ) injection  25 mcg Intravenous Once   ondansetron  (ZOFRAN ) IV  4 mg Intravenous Once   Continuous Infusions:  PRN Meds: acetaminophen  **OR** acetaminophen , albuterol , fentaNYL  (SUBLIMAZE ) injection, hydrOXYzine , ondansetron  **OR** ondansetron  (ZOFRAN ) IV, oxyCODONE , polyethylene glycol  Allergies:    Allergies  Allergen Reactions   Statins Other (See Comments)    Muscle aches   Gabapentin  Other (See Comments)    SOMNOLENCE    Social History:   Social History   Socioeconomic History   Marital status: Widowed    Spouse name: Not on file   Number of children: Not on file   Years of education: Not on file   Highest education level: Not on file  Occupational History   Not on file  Tobacco Use   Smoking status: Never    Passive exposure: Yes   Smokeless tobacco: Never  Substance and Sexual Activity   Alcohol  use: Yes    Alcohol /week: 0.0 standard drinks of alcohol     Comment: very seldom   Drug use: No   Sexual activity: Not Currently  Other Topics Concern   Not on file  Social History Narrative   She worked as a Tree Surgeon for 10  years    Has one daughter      She likes to read and she takes classes at [employer]   Social Drivers of [employer]: Low Risk  (05/22/2022)   Overall Financial Resource Strain (CARDIA)    Difficulty of Paying Living Expenses: Not hard at all  Food Insecurity: No Food Insecurity (04/10/2023)   Hunger Vital Sign    Worried About Running Out of Food in the Last Year: Never true    Ran Out of  Food in the Last Year: Never true  Transportation Needs: No Transportation Needs (04/10/2023)   PRAPARE - Administrator, Civil Service (Medical): No    Lack of Transportation (Non-Medical): No  Physical Activity: Inactive (05/22/2022)   Exercise Vital Sign    Days of Exercise per Week: 0 days    Minutes of Exercise per Session: 0 min  Stress: No Stress Concern Present (05/22/2022)   [employer] of Occupational Health - Occupational Stress Questionnaire    Feeling of Stress : Not at all  Social Connections: Moderately Integrated (04/10/2023)   Social Connection and Isolation Panel [NHANES]    Frequency of Communication with Friends and Family: More than three times a week    Frequency of Social Gatherings with Friends and Family: More than three times a week    Attends Religious Services: More than 4 times per year    Active Member of [employer] or Organizations: Yes    Attends Banker Meetings: More than 4 times per year    Marital Status: Widowed  Intimate Partner Violence: Not At Risk (04/10/2023)   Humiliation, Afraid, Rape, and Kick questionnaire    Fear of Current or Ex-Partner: No    Emotionally Abused: No    Physically Abused: No    Sexually Abused: No    Family History:    Family History  Problem Relation Age of Onset   COPD Father    Cancer Father        lung cancer   Lung cancer Other      ROS:  Please see the history of present illness.   All other ROS reviewed and negative.     Physical Exam/Data:   Vitals:   04/11/23  1446 04/11/23 2149 04/12/23 0558 04/12/23 0917  BP: (!) 90/54 105/62 (!) 114/59 123/80  Pulse: 89 95 94 91  Resp: 17 18 18 13   Temp: 97.6 F (36.4 C) (!) 97.5 F (36.4 C) 97.7 F (36.5 C) 97.9 F (36.6 C)  TempSrc:  Oral Oral   SpO2: 97% 94% 95% 96%  Weight:      Height:        Intake/Output Summary (Last 24 hours) at 04/12/2023 1151 Last data filed at 04/12/2023 0645 Gross per 24 hour  Intake 180 ml  Output 300 ml  Net -120 ml      04/10/2023   12:40 PM 04/03/2023    3:18 PM 03/20/2023    4:07 PM  Last 3 Weights  Weight (lbs) 121 lb -- --  Weight (kg) 54.885 kg -- --     Body mass index is 24.44 kg/m.  General:  Thin 89 yo in NAD  HEENT: normal Neck: no JVD Vascular: No carotid bruits; Distal pulses 2+ bilaterally Cardiac:  normal S1, S2; RRR;  II/VI systolic murmur LSB  No diastolic murmurs  Lungs:  clear to auscultation bilaterally,No rales  Abd: soft, nontender, no hepatomegaly  Ext: no LE edema  EKG:  The EKG was personally reviewed and demonstrates:  NO new Telemetry:  Telemetry was personally reviewed and demonstrates:  SR   Relevant CV Studies:  1. Left ventricular ejection fraction, by estimation, is 50 to 55%. The  left ventricle has low normal function. The left ventricle demonstrates  regional wall motion abnormalities- aneurysmal mid ventricular septum with  no mural thrombus and stable  apperance. There is moderate left ventricular hypertrophy. Left  ventricular diastolic parameters are indeterminate.   2. Right ventricular systolic function is normal.  The right ventricular  size is normal. There is mildly elevated pulmonary artery systolic  pressure. The estimated right ventricular systolic pressure is 43.8 mmHg.   3. Left atrial size was severely dilated.   4. The mitral valve is degenerative. Mild to moderate mitral valve  regurgitation. Severe mitral stenosis. The mean mitral valve gradient is  13.8 mmHg with average heart rate of 89 bpm. Severe  mitral annular  calcification.   5. The aortic valve has been repaired/replaced. Aortic valve  regurgitation is mild. There is a 23 mm [employer]onic stented ([name]) valve  present in the aortic position. Procedure Date: 2014. Aortic valve area,  by VTI measures 1.27 cm. Aortic valve mean  gradient measures 18.5 mmHg. Aortic valve Vmax measures 2.86 m/s. Aortic  valve acceleration time measures 79 msec, AV prosthesis is grossly stable,  DVI 0.37.   6. The inferior vena cava is normal in size with <50% respiratory  variability, suggesting right atrial pressure of 8 mmHg.   Laboratory Data:  High Sensitivity Troponin:  No results for input(s): TROPONINIHS in the last 720 hours.   Chemistry Recent Labs  Lab 04/10/23 1556 04/11/23 0336  NA 137 137  K 3.8 4.8  CL 101 101  CO2 27 25  GLUCOSE 100* 104*  BUN 97* 103*  CREATININE 2.56* 2.51*  CALCIUM  8.5* 8.5*  GFRNONAA 17* 17*  ANIONGAP 9 11    No results for input(s): PROT, ALBUMIN, AST, ALT, ALKPHOS, BILITOT in the last 168 hours. Lipids No results for input(s): CHOL, TRIG, HDL, LABVLDL, LDLCALC, CHOLHDL in the last 168 hours.  Hematology Recent Labs  Lab 04/10/23 1556 04/11/23 0336  WBC 9.3 6.2  RBC 3.02* 2.95*  HGB 9.5* 9.2*  HCT 30.6* 30.1*  MCV 101.3* 102.0*  MCH 31.5 31.2  MCHC 31.0 30.6  RDW 14.0 13.9  PLT 197 199   Thyroid  No results for input(s): TSH, FREET4 in the last 168 hours.  BNPNo results for input(s): BNP, PROBNP in the last 168 hours.  DDimer No results for input(s): DDIMER in the last 168 hours.   Radiology/Studies:  DG HIP UNILAT WITH PELVIS 2-3 VIEWS RIGHT Result Date: 04/10/2023 CLINICAL DATA:  Status post fall with right hip pain EXAM: DG HIP (WITH OR WITHOUT PELVIS) 3V RIGHT COMPARISON:  Pelvis radiograph dated 12/30/2019, right hip radiographs dated 07/31/2013 FINDINGS: Status post right hip arthroplasty. Hardware appears intact and well seated. There is an  obliquely oriented lucency through the proximal right femoral diaphysis associated with cortical discontinuity. No acute dislocation. Postsurgical changes of left proximal femoral fixation. Old healed fracture deformity of the proximal left femur. Vascular calcifications. Degenerative changes of the partially imaged lumbar spine. IMPRESSION: Minimally displaced right proximal femoral diaphyseal periprosthetic fracture. Electronically Signed   By: Limin  Xu M.D.   On: 04/10/2023 13:36   CT Head Wo Contrast Result Date: 04/10/2023 CLINICAL DATA:  Head trauma, minor (Age >= 65y) EXAM: CT HEAD WITHOUT CONTRAST TECHNIQUE: Contiguous axial images were obtained from the base of the skull through the vertex without intravenous contrast. RADIATION DOSE REDUCTION: This exam was performed according to the departmental dose-optimization program which includes automated exposure control, adjustment of the mA and/or kV according to patient size and/or use of iterative reconstruction technique. COMPARISON:  None Available. FINDINGS: Brain: No evidence of acute infarction, hemorrhage, hydrocephalus, extra-axial collection or mass lesion/mass effect. Small bilateral cerebellar infarcts. Patchy white matter hypodensities, nonspecific but compatible with chronic microvascular ischemic disease. Vascular: No hyperdense vessel. Skull: No acute  fracture. Sinuses/Orbits: Clear sinuses.  No acute orbital findings. Other: No mastoid effusions. IMPRESSION: No evidence of acute intracranial abnormality. Electronically Signed   By: Gilmore GORMAN Molt M.D.   On: 04/10/2023 13:32     Assessment and Plan:   Mitral stenosis   PT with severe mitral stenosis   She has Class IV CHF symptoms from this, can do minimal acitivty.    Recent clinic visit highlights this.    REcomm was for hospice and medical Rx/comfort measures.     2  HFpEF   Due to problem 1    Class IV symptoms  Volume status today overall looks OK today ( improved from  hospitalization in Nov 2024) Follow closely   Continue current meds    Encouraged pt to eat /drink (she has no appetite)  3  Hx AS    Recent echo in NOv 2024 mean gradient through the AV is 18 mm Hg  Stable   FOllow   4  CAD   No symptoms of angina   5  Ortho   periprosthetic hip fracture(R) after fall   Orthopedics is following  From cardiac standpoint she would be a very high risk for cardiac complications (CHF exacerbation) with surgery/ recovery Would be very difficult, questionable benefit as prior to admit she was very symptomatic with even minimal activity.    Will continued to follow   Vina Gull MD       For questions or updates, please contact [facility] Please consult [email or web address] for contact info under    Signed, Vina Gull, MD  04/12/2023 11:51 AM

## 2023-04-12 NOTE — Progress Notes (Signed)
 Patient has only voided 50mL during shift. MD made aware. Patient is drinking small amounts and has poor appetite. Will encourage PO intake and treat nausea. MD aware patient's home meds currently not ordered.

## 2023-04-12 NOTE — Progress Notes (Addendum)
 PROGRESS NOTE  Sara Garza FMW:[id number] DOB: [birth year]   PCP: Merna Huxley, NP  Patient is from: Home.  Uses rolling walker at baseline.  DOA: 04/10/2023 LOS: 2  Chief complaints Chief Complaint  Patient presents with   Fall     Brief Narrative / Interim history: 89 year old F with PMH of CAD, diet-controlled DM-2,  HFpEF, AS s/p [name], and severe atrial valve stenosis presented to ED with right hip pain and inability to bear weight after she had accidental fall at home.  Reportedly, she was in the kitchen when her walker toppled over and she fell on the right side.  She did not strike her head or lose consciousness.  Workup in ED revealed right periprosthetic hip fracture.  Orthopedic surgery consulted.  Ongoing discussion about plan of care.  Cardiology consulted for preoperative evaluation on 1/10.  Palliative medicine consulted on 1/11.  Subjective: Seen and examined earlier this morning.  No major events overnight of this morning.  Continues to endorse significant pain.  She rates her pain 6-7 on a scale of 10 just lying in bed.  Pain gets worse with movement.  She is understandably overwhelmed and tearful.  Objective: Vitals:   04/11/23 1446 04/11/23 2149 04/12/23 0558 04/12/23 0917  BP: (!) 90/54 105/62 (!) 114/59 123/80  Pulse: 89 95 94 91  Resp: 17 18 18 13   Temp: 97.6 F (36.4 C) (!) 97.5 F (36.4 C) 97.7 F (36.5 C) 97.9 F (36.6 C)  TempSrc:  Oral Oral   SpO2: 97% 94% 95% 96%  Weight:      Height:        Examination:  GENERAL: No apparent distress.  Nontoxic. HEENT: MMM.  Vision and hearing grossly intact.  NECK: Supple.  No apparent JVD.  RESP:  No IWOB.  Fair aeration bilaterally. CVS:  RRR. Heart sounds normal.  ABD/GI/GU: BS+. Abd soft, NTND.  MSK/EXT:  No edema.  Tender over right hip. SKIN: no apparent skin lesion or wound NEURO: Awake, alert and oriented appropriately.  No apparent focal neuro deficit. PSYCH: Tearful.  Procedures:   None  Microbiology summarized: None  Assessment and plan: Accidental fall at home-did not strike head or lose consciousness.  CT head without acute finding. Right periprosthetic hip fracture due to dental fall at home -Orthopedic surgery on board-ongoing discussion about plan of care -Patient no significant cardiac history as above.  Cardiology consulted for preoperative evaluation -Palliative medicine consulted if surgery is not pursued -Bedrest and pain control until we have clear plan from orthopedic surgery -Check vitamin D   Chronic HFpEF: Appears compensated. History of aortic stenosis s/p [name] Severe mitral valve stenosis -TTE in 01/2023 with LVEF of 50 to 55%, G1 DD, severe LAE, severe mitral stenosis with mild to moderate MVR -Continue holding Lasix  -Strict intake and output -Cardiology consult for preoperative evaluation   AKI on CKD-4: Baseline Cr ~2.0.  Cr seems to have plateaued. Recent Labs    02/23/23 0715 02/24/23 0206 02/25/23 0210 02/26/23 0806 02/27/23 0651 02/28/23 0838 03/20/23 1715 04/03/23 1652 04/10/23 1556 04/11/23 0336  BUN 57* 64* 65* 60* 63* 62* 64* 77* 97* 103*  CREATININE 2.17* 2.07* 1.95* 2.03* 2.11* 1.93* 2.04* 2.12* 2.56* 2.51*  -Continue holding home Lasix  -Recheck in the morning -Monitor urine output  Anemia of renal disease: H&H stable Recent Labs    02/15/23 1729 02/15/23 2022 02/16/23 1622 02/17/23 0312 02/19/23 0837 02/26/23 0806 02/27/23 0651 04/10/23 1556 04/11/23 0336  HGB 10.8* 13.9 11.0* 9.7* 10.4*  9.4* 8.7* 9.5* 9.2*  -Continue monitoring  Hypotension: Resolved.  Now normotensive. -Continue holding metoprolol  and Lasix  for now.    Body mass index is 24.44 kg/m.          DVT prophylaxis:  enoxaparin  (LOVENOX ) injection 30 mg Start: 04/10/23 2200 SCDs Start: 04/10/23 1709  Code Status: DNR/DNI Family Communication: None at bedside today. Level of care: Med-Surg Status is: Inpatient Remains inpatient  appropriate because: Right periprosthetic femoral fracture   Final disposition: TBD Consultants:  Orthopedic surgery Cardiology Palliative medicine  35 minutes with more than 50% spent in reviewing records, counseling patient/family and coordinating care.   Sch Meds:  Scheduled Meds:  docusate sodium   100 mg Oral BID   enoxaparin  (LOVENOX ) injection  30 mg Subcutaneous Q24H   fentaNYL  (SUBLIMAZE ) injection  25 mcg Intravenous Once   ondansetron  (ZOFRAN ) IV  4 mg Intravenous Once   Continuous Infusions: PRN Meds:.acetaminophen  **OR** acetaminophen , albuterol , fentaNYL  (SUBLIMAZE ) injection, hydrOXYzine , ondansetron  **OR** ondansetron  (ZOFRAN ) IV, oxyCODONE , polyethylene glycol  Antimicrobials: Anti-infectives (From admission, onward)    None        I have personally reviewed the following labs and images: CBC: Recent Labs  Lab 04/10/23 1556 04/11/23 0336  WBC 9.3 6.2  HGB 9.5* 9.2*  HCT 30.6* 30.1*  MCV 101.3* 102.0*  PLT 197 199   BMP &GFR Recent Labs  Lab 04/10/23 1556 04/11/23 0336  NA 137 137  K 3.8 4.8  CL 101 101  CO2 27 25  GLUCOSE 100* 104*  BUN 97* 103*  CREATININE 2.56* 2.51*  CALCIUM  8.5* 8.5*   Estimated Creatinine Clearance: 9.7 mL/min (A) (by C-G formula based on SCr of 2.51 mg/dL (H)). Liver & Pancreas: No results for input(s): AST, ALT, ALKPHOS, BILITOT, PROT, ALBUMIN in the last 168 hours. No results for input(s): LIPASE, AMYLASE in the last 168 hours. No results for input(s): AMMONIA in the last 168 hours. Diabetic: No results for input(s): HGBA1C in the last 72 hours. No results for input(s): GLUCAP in the last 168 hours. Cardiac Enzymes: No results for input(s): CKTOTAL, CKMB, CKMBINDEX, TROPONINI in the last 168 hours. No results for input(s): PROBNP in the last 8760 hours. Coagulation Profile: No results for input(s): INR, PROTIME in the last 168 hours. Thyroid  Function Tests: No results  for input(s): TSH, T4TOTAL, FREET4, T3FREE, THYROIDAB in the last 72 hours. Lipid Profile: No results for input(s): CHOL, HDL, LDLCALC, TRIG, CHOLHDL, LDLDIRECT in the last 72 hours. Anemia Panel: No results for input(s): VITAMINB12, FOLATE, FERRITIN, TIBC, IRON, RETICCTPCT in the last 72 hours. Urine analysis:    Component Value Date/Time   COLORURINE YELLOW 09/17/2016 1155   APPEARANCEUR Sl Cloudy (A) 09/17/2016 1155   LABSPEC 1.025 09/17/2016 1155   PHURINE 5.0 09/17/2016 1155   GLUCOSEU NEGATIVE 09/17/2016 1155   HGBUR NEGATIVE 09/17/2016 1155   BILIRUBINUR NEGATIVE 09/17/2016 1155   BILIRUBINUR n 06/18/2016 1016   KETONESUR 15 (A) 09/17/2016 1155   PROTEINUR n 06/18/2016 1016   PROTEINUR NEGATIVE 06/02/2016 1753   UROBILINOGEN 0.2 09/17/2016 1155   NITRITE NEGATIVE 09/17/2016 1155   LEUKOCYTESUR TRACE (A) 09/17/2016 1155   Sepsis Labs: Invalid input(s): PROCALCITONIN, LACTICIDVEN  Microbiology: No results found for this or any previous visit (from the past 240 hours).  Radiology Studies: No results found.    Sara Garza T. Allyah Heather [facility]  If 7PM-7AM, please contact night-coverage [email or web address] 04/12/2023, 11:45 AM

## 2023-04-13 DIAGNOSIS — Z66 Do not resuscitate: Secondary | ICD-10-CM | POA: Diagnosis not present

## 2023-04-13 DIAGNOSIS — Z515 Encounter for palliative care: Secondary | ICD-10-CM | POA: Diagnosis not present

## 2023-04-13 DIAGNOSIS — I050 Rheumatic mitral stenosis: Secondary | ICD-10-CM | POA: Diagnosis not present

## 2023-04-13 DIAGNOSIS — R110 Nausea: Secondary | ICD-10-CM

## 2023-04-13 DIAGNOSIS — M25551 Pain in right hip: Secondary | ICD-10-CM | POA: Diagnosis not present

## 2023-04-13 DIAGNOSIS — S72001A Fracture of unspecified part of neck of right femur, initial encounter for closed fracture: Secondary | ICD-10-CM | POA: Diagnosis not present

## 2023-04-13 DIAGNOSIS — S72001S Fracture of unspecified part of neck of right femur, sequela: Secondary | ICD-10-CM | POA: Diagnosis not present

## 2023-04-13 LAB — RENAL FUNCTION PANEL
Albumin: 3 g/dL — ABNORMAL LOW (ref 3.5–5.0)
Anion gap: 10 (ref 5–15)
BUN: 124 mg/dL — ABNORMAL HIGH (ref 8–23)
CO2: 26 mmol/L (ref 22–32)
Calcium: 8.7 mg/dL — ABNORMAL LOW (ref 8.9–10.3)
Chloride: 99 mmol/L (ref 98–111)
Creatinine, Ser: 3.38 mg/dL — ABNORMAL HIGH (ref 0.44–1.00)
GFR, Estimated: 12 mL/min — ABNORMAL LOW (ref >60)
Glucose, Bld: 92 mg/dL (ref 70–99)
Phosphorus: 7.5 mg/dL — ABNORMAL HIGH (ref 2.5–4.6)
Potassium: 5.4 mmol/L — ABNORMAL HIGH (ref 3.5–5.1)
Sodium: 135 mmol/L (ref 135–145)

## 2023-04-13 LAB — CBC
HCT: 29.3 % — ABNORMAL LOW (ref 36.0–46.0)
Hemoglobin: 9.1 g/dL — ABNORMAL LOW (ref 12.0–15.0)
MCH: 30.7 pg (ref 26.0–34.0)
MCHC: 31.1 g/dL (ref 30.0–36.0)
MCV: 99 fL (ref 80.0–100.0)
Platelets: 193 10*3/uL (ref 150–400)
RBC: 2.96 MIL/uL — ABNORMAL LOW (ref 3.87–5.11)
RDW: 13.6 % (ref 11.5–15.5)
WBC: 8.1 10*3/uL (ref 4.0–10.5)
nRBC: 0 % (ref 0.0–0.2)

## 2023-04-13 LAB — MAGNESIUM: Magnesium: 2.7 mg/dL — ABNORMAL HIGH (ref 1.7–2.4)

## 2023-04-13 LAB — VITAMIN D 25 HYDROXY (VIT D DEFICIENCY, FRACTURES): Vit D, 25-Hydroxy: 35.71 ng/mL (ref 30–100)

## 2023-04-13 MED ORDER — ACETAMINOPHEN 500 MG PO TABS
1000.0000 mg | ORAL_TABLET | Freq: Three times a day (TID) | ORAL | Status: DC
  Administered 2023-04-13 – 2023-04-17 (×9): 1000 mg via ORAL
  Filled 2023-04-13 (×14): qty 2

## 2023-04-13 MED ORDER — SODIUM CHLORIDE 0.9 % IV SOLN: INTRAVENOUS | Status: AC

## 2023-04-13 MED ORDER — CARMEX CLASSIC LIP BALM EX OINT
TOPICAL_OINTMENT | CUTANEOUS | Status: DC | PRN
  Filled 2023-04-13: qty 10

## 2023-04-13 MED ORDER — PROCHLORPERAZINE EDISYLATE 10 MG/2ML IJ SOLN
5.0000 mg | Freq: Four times a day (QID) | INTRAMUSCULAR | Status: DC | PRN
  Administered 2023-04-13: 5 mg via INTRAVENOUS
  Filled 2023-04-13: qty 2

## 2023-04-13 MED ORDER — PROCHLORPERAZINE EDISYLATE 10 MG/2ML IJ SOLN
5.0000 mg | Freq: Once | INTRAMUSCULAR | Status: AC
  Administered 2023-04-13: 5 mg via INTRAVENOUS
  Filled 2023-04-13: qty 2

## 2023-04-13 MED ORDER — FENTANYL CITRATE PF 50 MCG/ML IJ SOSY
25.0000 ug | PREFILLED_SYRINGE | INTRAMUSCULAR | Status: AC
  Administered 2023-04-13 (×2): 25 ug via INTRAVENOUS
  Filled 2023-04-13 (×2): qty 1

## 2023-04-13 NOTE — Progress Notes (Signed)
 The patient states that the fentanyl has been making her more drowsy and request reevaluation of scheduled analgesics.

## 2023-04-13 NOTE — Progress Notes (Signed)
 Rounding Note    Patient Name: Sara Garza Date of Encounter: 04/13/2023  [facility] Cardiologist: Ozell Fell, MD   Subjective   Pt says leg pain some better   No CP  breathing is OK   Inpatient Medications    Scheduled Meds:  docusate sodium   100 mg Oral BID   enoxaparin  (LOVENOX ) injection  30 mg Subcutaneous Q24H   fentaNYL  (SUBLIMAZE ) injection  25 mcg Intravenous Once   ondansetron  (ZOFRAN ) IV  4 mg Intravenous Once   Continuous Infusions:  PRN Meds: acetaminophen  **OR** acetaminophen , albuterol , fentaNYL  (SUBLIMAZE ) injection, hydrOXYzine , ondansetron  **OR** ondansetron  (ZOFRAN ) IV, oxyCODONE , polyethylene glycol   Vital Signs    Vitals:   04/12/23 0917 04/12/23 1317 04/12/23 2209 04/13/23 0553  BP: 123/80 (!) 103/59 105/65 103/65  Pulse: 91 89 92 93  Resp: 13 16 19 18   Temp: 97.9 F (36.6 C)  97.9 F (36.6 C) 98.4 F (36.9 C)  TempSrc:  Oral    SpO2: 96% 95% 93% 95%  Weight:      Height:        Intake/Output Summary (Last 24 hours) at 04/13/2023 9356 Last data filed at 04/12/2023 2209 Gross per 24 hour  Intake 450 ml  Output 150 ml  Net 300 ml      04/10/2023   12:40 PM 04/03/2023    3:18 PM 03/20/2023    4:07 PM  Last 3 Weights  Weight (lbs) 121 lb -- --  Weight (kg) 54.885 kg -- --      Telemetry     - Personally Reviewed  ECG    No new  - Personally Reviewed  Physical Exam   GEN: No acute distress.   Neck: No JVD Cardiac: RRR,II/VI systolic murmur LB Respiratory: Relatively CTA  GI: Soft, nontender, non-distended  MS: Tr edema legs   Labs    High Sensitivity Troponin:  No results for input(s): TROPONINIHS in the last 720 hours.   Chemistry Recent Labs  Lab 04/10/23 1556 04/11/23 0336 04/13/23 0352  NA 137 137 135  K 3.8 4.8 5.4*  CL 101 101 99  CO2 27 25 26   GLUCOSE 100* 104* 92  BUN 97* 103* 124*  CREATININE 2.56* 2.51* 3.38*  CALCIUM  8.5* 8.5* 8.7*  MG  --   --  2.7*  ALBUMIN  --   --  3.0*   GFRNONAA 17* 17* 12*  ANIONGAP 9 11 10     Lipids No results for input(s): CHOL, TRIG, HDL, LABVLDL, LDLCALC, CHOLHDL in the last 168 hours.  Hematology Recent Labs  Lab 04/10/23 1556 04/11/23 0336 04/13/23 0352  WBC 9.3 6.2 8.1  RBC 3.02* 2.95* 2.96*  HGB 9.5* 9.2* 9.1*  HCT 30.6* 30.1* 29.3*  MCV 101.3* 102.0* 99.0  MCH 31.5 31.2 30.7  MCHC 31.0 30.6 31.1  RDW 14.0 13.9 13.6  PLT 197 199 193   Thyroid  No results for input(s): TSH, FREET4 in the last 168 hours.  BNPNo results for input(s): BNP, PROBNP in the last 168 hours.  DDimer No results for input(s): DDIMER in the last 168 hours.   Radiology    No results found.  Cardiac Studies   Echo  Nov 2024  1. Left ventricular ejection fraction, by estimation, is 50 to 55%. The  left ventricle has low normal function. The left ventricle demonstrates  regional wall motion abnormalities- aneurysmal mid ventricular septum with  no mural thrombus and stable  apperance. There is moderate left ventricular hypertrophy. Left  ventricular diastolic parameters are indeterminate.   2. Right ventricular systolic function is normal. The right ventricular  size is normal. There is mildly elevated pulmonary artery systolic  pressure. The estimated right ventricular systolic pressure is 43.8 mmHg.   3. Left atrial size was severely dilated.   4. The mitral valve is degenerative. Mild to moderate mitral valve  regurgitation. Severe mitral stenosis. The mean mitral valve gradient is  13.8 mmHg with average heart rate of 89 bpm. Severe mitral annular  calcification.   5. The aortic valve has been repaired/replaced. Aortic valve  regurgitation is mild. There is a 23 mm [employer] stented ([name]) valve  present in the aortic position. Procedure Date: 2014. Aortic valve area,  by VTI measures 1.27 cm. Aortic valve mean  gradient measures 18.5 mmHg. Aortic valve Vmax measures 2.86 m/s. Aortic  valve acceleration time  measures 79 msec, AV prosthesis is grossly stable,  DVI 0.37.   6. The inferior vena cava is normal in size with <50% respiratory  variability, suggesting right atrial pressure of 8 mmHg.   Patient Profile     89 y.o. female with hx of severe mitral stenosis, hx of HFpEF, Hx CAD  admitted for hip fx    Assessment & Plan    1  Mitral stenosis    Pt with severe mitral stenosis with Class LV CHF symptoms at times (she has been doing a bit better recently)  Followed closely by Sara Garza in [facility]  He has organized hospice to see pt   They have been by her home      From cardiac standpoint she would be at extreme risk for cardiac complications with CHF   It would be a very long recovery if she recovers      Overall I do not think she is a good candidate for surgical procedures and would recomm measures for comfort  2  HFpEF   Due to MS in part    Unfortunately no long term solution Volume status looks OK but patient may be a little dry (bump in Cr)    I would VERY carefully give IV fluids as she is extremely sensitive    She looked better yesterday but was probably dry  (PO intake down, not hungry) I encouraged PO    I would check BMET later today and in am   Favor letting her autohydrate  Encouraged her to eat/drink I will set for 10 hours then off      She had been on 40 daily lasix  at home  Held for now    3  CAD  No symptoms of angina   Palliative care of [facility] has seen pt   Can they be notified that she is here?   I will sign off for now    Please call with questions   Now with mobility issues will work with Hospice service  for follow up care/questions   For questions or updates, please contact [facility] Please consult [email or web address] for contact info under        Signed, Sara Gull, MD  04/13/2023, 6:43 AM

## 2023-04-13 NOTE — Progress Notes (Addendum)
 PROGRESS NOTE  Sara Garza FMW:[id number] DOB: 1923/10/06   PCP: Merna Huxley, NP  Patient is from: Home.  Uses rolling walker at baseline.  DOA: 04/10/2023 LOS: 3  Chief complaints Chief Complaint  Patient presents with   Fall     Brief Narrative / Interim history: 89 year old F with PMH of CAD, diet-controlled DM-2,  HFpEF, AS s/p [name], and severe atrial valve stenosis presented to ED with right hip pain and inability to bear weight after she had accidental fall at home.  Reportedly, she was in the kitchen when her walker toppled over and she fell on the right side.  She did not strike her head or lose consciousness.  Workup in ED revealed right periprosthetic hip fracture.  Orthopedic surgery and cardiology consulted.  Surgery felt to be high risk given her underlying cardiac comorbidity.  Of note, patient was establishing care with [facility] hospice due to his stage IV CHF before this hospitalization.  Palliative medicine consulted.   Subjective: Seen and examined earlier this morning.  No major events overnight of this morning.  Pain fairly controlled.  Patient's daughter at bedside.  They understand and agree that surgery is high risk with her underlying cardiac disease.  Per daughter, she had intake assessment by [facility] hospice before this hospitalization.  They are waiting for palliative medicine visit while in [place].  They were wondering about pain management if she is discharged home with hospice.  Patient wishes to fall asleep and not wake up again.  Patient's daughter was tearful  Objective: Vitals:   04/12/23 1317 04/12/23 2209 04/13/23 0553 04/13/23 0859  BP: (!) 103/59 105/65 103/65 112/61  Pulse: 89 92 93 89  Resp: 16 19 18 20   Temp:  97.9 F (36.6 C) 98.4 F (36.9 C) 98.1 F (36.7 C)  TempSrc: Oral   Oral  SpO2: 95% 93% 95% 93%  Weight:      Height:        Examination:  GENERAL: No apparent distress.  Nontoxic. HEENT: MMM.  Vision and hearing grossly  intact.  NECK: Supple.  No apparent JVD.  RESP:  No IWOB.  Fair aeration bilaterally. CVS:  RRR. Heart sounds normal.  ABD/GI/GU: BS+. Abd soft, NTND.  MSK/EXT:  No edema.  Tender over right hip. SKIN: no apparent skin lesion or wound NEURO: Awake, alert and oriented appropriately.  No apparent focal neuro deficit. PSYCH: Tearful.  Procedures:  None  Microbiology summarized: None  Assessment and plan: Accidental fall at home-did not strike head or lose consciousness.  CT head without acute finding. Right periprosthetic hip fracture due to dental fall at home -Evaluated by Ortho and cardiology-high risk for surgery given advanced underlying cardiac disease. -Palliative medicine consulted.  Per daughter, patient had intake assessment by [facility] hospice before hospitalization -Pain control  Chronic HFpEF: Appears compensated. History of aortic stenosis s/p [name] Severe mitral valve stenosis -TTE in 01/2023 with LVEF of 50 to 55%, G1 DD, severe LAE, severe mitral stenosis with mild to moderate MVR -Continue holding Lasix  -Strict intake and output -Cardiology consult for preoperative evaluation   AKI on CKD-4: Baseline Cr ~2.0.  AKI likely due to poor p.o. intake. Recent Labs    02/24/23 0206 02/25/23 0210 02/26/23 0806 02/27/23 0651 02/28/23 0838 03/20/23 1715 04/03/23 1652 04/10/23 1556 04/11/23 0336 04/13/23 0352  BUN 64* 65* 60* 63* 62* 64* 77* 97* 103* 124*  CREATININE 2.07* 1.95* 2.03* 2.11* 1.93* 2.04* 2.12* 2.56* 2.51* 3.38*  -Continue holding home Lasix  -IV  NS at 60 cc an hour -Monitor urine output  Anemia of renal disease: H&H stable Recent Labs    02/15/23 1729 02/15/23 2022 02/16/23 1622 02/17/23 0312 02/19/23 0837 02/26/23 0806 02/27/23 0651 04/10/23 1556 04/11/23 0336 04/13/23 0352  HGB 10.8* 13.9 11.0* 9.7* 10.4* 9.4* 8.7* 9.5* 9.2* 9.1*  -Continue monitoring  Hypotension: Resolved.  Now normotensive. -Continue holding metoprolol  and Lasix   for now.  Hyperkalemia: Likely due to renal failure -IV fluid as above   Goal of care: Significant comorbidity as above.  Now with hip fracture.  Not a surgical candidate due to advanced CHF and CKD.  Appropriately DNR/DNI.  Per daughter, patient had initial assessment by [facility] hospice before this hospitalization.  Patient and daughter agree symptom management and comfort are priority but worries how those can be achieved if she goes home with home hospice. -Palliative medicine consulted. -TOC consulted to reach out to [facility] hospice  Body mass index is 24.44 kg/m.          DVT prophylaxis:  SCDs Start: 04/10/23 1709  Code Status: DNR/DNI Family Communication: None at bedside today. Level of care: Med-Surg Status is: Inpatient Remains inpatient appropriate because: Right periprosthetic femoral fracture   Final disposition: TBD Consultants:  Orthopedic surgery Cardiology Palliative medicine  55 minutes with more than 50% spent in reviewing records, counseling patient/family and coordinating care.   Sch Meds:  Scheduled Meds:  acetaminophen   1,000 mg Oral Q8H   docusate sodium   100 mg Oral BID   fentaNYL  (SUBLIMAZE ) injection  25 mcg Intravenous Once   fentaNYL  (SUBLIMAZE ) injection  25 mcg Intravenous Q4H   ondansetron  (ZOFRAN ) IV  4 mg Intravenous Once   Continuous Infusions:  sodium chloride  60 mL/hr at 04/13/23 0904   PRN Meds:.albuterol , fentaNYL  (SUBLIMAZE ) injection, hydrOXYzine , ondansetron  **OR** ondansetron  (ZOFRAN ) IV, polyethylene glycol  Antimicrobials: Anti-infectives (From admission, onward)    None        I have personally reviewed the following labs and images: CBC: Recent Labs  Lab 04/10/23 1556 04/11/23 0336 04/13/23 0352  WBC 9.3 6.2 8.1  HGB 9.5* 9.2* 9.1*  HCT 30.6* 30.1* 29.3*  MCV 101.3* 102.0* 99.0  PLT 197 199 193   BMP &GFR Recent Labs  Lab 04/10/23 1556 04/11/23 0336 04/13/23 0352  NA 137 137 135  K 3.8 4.8  5.4*  CL 101 101 99  CO2 27 25 26   GLUCOSE 100* 104* 92  BUN 97* 103* 124*  CREATININE 2.56* 2.51* 3.38*  CALCIUM  8.5* 8.5* 8.7*  MG  --   --  2.7*  PHOS  --   --  7.5*   Estimated Creatinine Clearance: 7.2 mL/min (A) (by C-G formula based on SCr of 3.38 mg/dL (H)). Liver & Pancreas: Recent Labs  Lab 04/13/23 0352  ALBUMIN 3.0*   No results for input(s): LIPASE, AMYLASE in the last 168 hours. No results for input(s): AMMONIA in the last 168 hours. Diabetic: No results for input(s): HGBA1C in the last 72 hours. No results for input(s): GLUCAP in the last 168 hours. Cardiac Enzymes: No results for input(s): CKTOTAL, CKMB, CKMBINDEX, TROPONINI in the last 168 hours. No results for input(s): PROBNP in the last 8760 hours. Coagulation Profile: No results for input(s): INR, PROTIME in the last 168 hours. Thyroid  Function Tests: No results for input(s): TSH, T4TOTAL, FREET4, T3FREE, THYROIDAB in the last 72 hours. Lipid Profile: No results for input(s): CHOL, HDL, LDLCALC, TRIG, CHOLHDL, LDLDIRECT in the last 72 hours. Anemia Panel: No results for  input(s): VITAMINB12, FOLATE, FERRITIN, TIBC, IRON, RETICCTPCT in the last 72 hours. Urine analysis:    Component Value Date/Time   COLORURINE YELLOW 09/17/2016 1155   APPEARANCEUR Sl Cloudy (A) 09/17/2016 1155   LABSPEC 1.025 09/17/2016 1155   PHURINE 5.0 09/17/2016 1155   GLUCOSEU NEGATIVE 09/17/2016 1155   HGBUR NEGATIVE 09/17/2016 1155   BILIRUBINUR NEGATIVE 09/17/2016 1155   BILIRUBINUR n 06/18/2016 1016   KETONESUR 15 (A) 09/17/2016 1155   PROTEINUR n 06/18/2016 1016   PROTEINUR NEGATIVE 06/02/2016 1753   UROBILINOGEN 0.2 09/17/2016 1155   NITRITE NEGATIVE 09/17/2016 1155   LEUKOCYTESUR TRACE (A) 09/17/2016 1155   Sepsis Labs: Invalid input(s): PROCALCITONIN, LACTICIDVEN  Microbiology: No results found for this or any previous visit (from the past 240  hours).  Radiology Studies: No results found.    Adian Jablonowski T. Johanthan [facility]  If 7PM-7AM, please contact night-coverage [email or web address] 04/13/2023, 11:59 AM

## 2023-04-13 NOTE — Plan of Care (Signed)
  Problem: Skin Integrity: Goal: Risk for impaired skin integrity will decrease Outcome: Progressing   Problem: Education: Goal: Verbalization of understanding the information provided (i.e., activity precautions, restrictions, etc) will improve Outcome: Progressing

## 2023-04-13 NOTE — Consult Note (Signed)
 Consultation Note Date: 04/13/2023   Patient Name: Sara Garza  DOB: March 10, 1924  MRN: [id number]  Age / Sex: 89 y.o., female  PCP: Sara Huxley, NP Referring Physician: Kathrin Mignon DASEN, MD  Reason for Consultation: Establishing goals of care  HPI/Patient Profile: 89 y.o. female   admitted on 04/10/2023 with  PMH of CAD, diet-controlled DM-2,  HFpEF, AS s/p [name], and severe atrial valve stenosis presented to ED with right hip pain and inability to bear weight after she had accidental fall at home.  Reportedly,  she did not strike her head or lose consciousness.  Workup in ED revealed right periprosthetic hip fracture.    Orthopedic surgery consulted.  Given her underlying significant medical co-morbidities, any consideration of surgical intervention would be associated with relatively high potential morbidity or mortality.  Patient and her daughter have made decision for conservative medical management and focus now on comfort and dignity.   Clinical Assessment and Goals of Care:  This NP Sara Garza reviewed medical records, received report from team, assessed the patient and then meet at the patient's bedside /with her daughter Sara Garza/main support person/ stated H POA/ to discuss diagnosis, prognosis, GOC, EOL wishes disposition and options.   Concept of Palliative Care was introduced as specialized medical care for people and their families living with serious illness.  If focuses on providing relief from the symptoms and stress of a serious illness.  The goal is to improve quality of life for both the patient and the family.  Values and goals of care important to patient and family were attempted to be elicited.  Patient shared lovingly her  appreciation for her mother.  Sara is an only child,  she shares her mother's love of reading and cooking.  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  Although patient is alert and oriented she rested quietly throughout our conversation today, at the end of the meeting patient was able to concur that her hope is for comfort and dignity at end-of-life  Her daughter verbalizes understanding for seriousness of current medical situation and likely associated long-term poor prognosis.  Patient has been able to express to her daughter her wishes for comfort and dignity at this time in her life and hope is ultimately for her to discharge home with hospice.  Education offered on hospice benefit; philosophy and eligibility  Patient has had one visit from [facility] [place] under their palliative service.  Daughter is going to contemplate hospice choice tonight and make decision in the morning whether to use [facility] which is closer to home or [facility] [place].  Consideration is specifically regarding distance from her home and convenience.     A  discussion was had today regarding advanced directives.  Concepts specific to code status, artifical feeding and hydration, continued IV antibiotics and rehospitalization was had.    The  difference between a aggressive medical intervention path  and a palliative comfort care path for this patient at this time was had.   Detailed education offered on foregoing life-prolonging measures such as lab draws,IV fluids, diagnostics being part of a full comfort path allowing for natural death.  MOST form introduced   Natural trajectory and expectations at EOL were discussed.  Questions and concerns addressed.  Patient  encouraged to call with questions or concerns.     PMT will continue to support holistically.         Patient daughter tells me she does have advance care planning documents and will bring them in for scanning        SUMMARY OF  RECOMMENDATIONS    Code Status/Advance Care Planning: DNR   Symptom Management:  Pain/dyspnea-Fentanyl  25 mcg IV scheduled every 4 hours and every 2 hours as needed                            -Tylenol  1000 mg p.o. every 8 hours scheduled Nausea-Zofran  4 mg IV every 6 hours as needed                     Compazine  5 mg IV every 6 hours as needed                     Crackers and ginger ale/this helps her at home   Palliative Prophylaxis:  Bowel Regimen, Delirium Protocol, Frequent Pain Assessment, and Oral Care  Additional Recommendations (Limitations, Scope, Preferences): Full Comfort Care  Psycho-social/Spiritual:  Desire for further Chaplaincy support:no-[facility] priest has been notified by family Additional Recommendations: Education on Hospice  Prognosis:  < 4 weeks   --education offered on patient's high risk for decompensation secondary to decreased mobility, high risk for infection, poor p.o. intake  Discharge Planning: Patient's daughter is going to contemplate choice regarding which hospice to utilize tonight and will communicate that to treatment team tomorrow   To Be Determined      Primary Diagnoses: Present on Admission:  Hip fracture (HCC)   I have reviewed the medical record, interviewed the patient and family, and examined the patient. The following aspects are pertinent.  Past Medical History:  Diagnosis Date   Arthritis    CAD (coronary artery disease)    Carotid stenosis, left    50-60% stable   CHF (congestive heart failure) (HCC) 2024   Diabetes mellitus    Glaucoma    Hyperlipidemia    Hypertension    Leg pain    ABIs 2/18: normal bilaterally.   On supplemental oxygen by nasal cannula 02/15/2023   Osteoporosis    Valvular heart disease    Aortic Stenosis s/p [name] 2014 // Mitral stenosis // Echo 09/2018: EF > 65, mod LVH, severe focal basal hypertrophy, RVSP 39.8, mild to mod MR, mod to severe MS (mean 9), AVR with mild AI, severe LAE (similar  to prior echo)   Social History   Socioeconomic History   Marital status: Widowed    Spouse name: Not on file   Number of children: Not on file   Years of education: Not on file   Highest education level: Not on file  Occupational History   Not on file  Tobacco Use   Smoking status: Never    Passive exposure: Yes   Smokeless tobacco: Never  Substance and Sexual Activity   Alcohol  use: Yes  Alcohol /week: 0.0 standard drinks of alcohol     Comment: very seldom   Drug use: No   Sexual activity: Not Currently  Other Topics Concern   Not on file  Social History Narrative   She worked as a Tree Surgeon for 10 years    Has one daughter      She likes to read and she takes classes at [employer]   Social Drivers of [employer]: Low Risk  (05/22/2022)   Overall Financial Resource Strain (CARDIA)    Difficulty of Paying Living Expenses: Not hard at all  Food Insecurity: No Food Insecurity (04/10/2023)   Hunger Vital Sign    Worried About Running Out of Food in the Last Year: Never true    Ran Out of Food in the Last Year: Never true  Transportation Needs: No Transportation Needs (04/10/2023)   PRAPARE - Administrator, Civil Service (Medical): No    Lack of Transportation (Non-Medical): No  Physical Activity: Inactive (05/22/2022)   Exercise Vital Sign    Days of Exercise per Week: 0 days    Minutes of Exercise per Session: 0 min  Stress: No Stress Concern Present (05/22/2022)   [employer] of Occupational Health - Occupational Stress Questionnaire    Feeling of Stress : Not at all  Social Connections: Moderately Integrated (04/10/2023)   Social Connection and Isolation Panel [NHANES]    Frequency of Communication with Friends and Family: More than three times a week    Frequency of Social Gatherings with Friends and Family: More than three times a week    Attends Religious Services: More than 4 times per year    Active Member  of [employer] or Organizations: Yes    Attends Banker Meetings: More than 4 times per year    Marital Status: Widowed   Family History  Problem Relation Age of Onset   COPD Father    Cancer Father        lung cancer   Lung cancer Other    Scheduled Meds:  docusate sodium   100 mg Oral BID   enoxaparin  (LOVENOX ) injection  30 mg Subcutaneous Q24H   fentaNYL  (SUBLIMAZE ) injection  25 mcg Intravenous Once   ondansetron  (ZOFRAN ) IV  4 mg Intravenous Once   Continuous Infusions:  sodium chloride  60 mL/hr at 04/13/23 0904   PRN Meds:.acetaminophen  **OR** acetaminophen , albuterol , fentaNYL  (SUBLIMAZE ) injection, hydrOXYzine , ondansetron  **OR** ondansetron  (ZOFRAN ) IV, oxyCODONE , polyethylene glycol Medications Prior to Admission:  Prior to Admission medications   Medication Sig Start Date End Date Taking? Authorizing Provider  acetaminophen  (TYLENOL ) 325 MG tablet Take 2 tablets (650 mg total) by mouth every 6 (six) hours as needed for moderate pain (pain score 4-6) or mild pain (pain score 1-3). 03/02/23  Yes Arrien, Elidia Sieving, MD  aspirin  EC 81 MG tablet Take 81 mg by mouth daily. Swallow whole.   Yes [provider]  Calcium  Carb-Cholecalciferol 500-400 MG-UNIT TABS Take 1 tablet by mouth daily.   Yes [provider]  furosemide  (LASIX ) 40 MG tablet Take 2 tablets (80mg ) by mouth daily for 3 days, then decrease to 1 tablet daily thereafter Patient taking differently: Take 40 mg by mouth daily. Take 2 tablets (80mg ) by mouth daily for 3 days, then decrease to 1 tablet daily thereafter 04/03/23  Yes Wonda Sharper, MD  metoprolol  tartrate (LOPRESSOR ) 25 MG tablet Take 0.5 tablets (12.5 mg total) by mouth 2 (two) times daily.  03/02/23 04/11/23 Yes Arrien, Mauricio Daniel, MD  Multiple Vitamin (MULTIVITAMIN) tablet Take 1 tablet by mouth daily.   Yes [provider]  Multiple Vitamins-Minerals (PRESERVISION AREDS 2+MULTI VIT PO) Take 2 capsules by mouth in  the morning and at bedtime.   Yes [provider]  ondansetron  (ZOFRAN ) 4 MG tablet Take 1 tablet (4 mg total) by mouth every 8 (eight) hours as needed for nausea or vomiting. 04/09/23  Yes Nafziger, Darleene, NP  polyethylene glycol (MIRALAX  / GLYCOLAX ) 17 g packet Take 17 g by mouth daily as needed for mild constipation. 03/02/23  Yes Arrien, Elidia Sieving, MD  ezetimibe  (ZETIA ) 10 MG tablet Take 1 tablet (10 mg total) by mouth daily. 10/04/22   Wonda Sharper, MD  hydrOXYzine  (ATARAX ) 10 MG tablet Take 1 tablet (10 mg total) by mouth at bedtime as needed. Patient taking differently: Take 10 mg by mouth at bedtime as needed for anxiety or itching. 04/10/23   Nafziger, Darleene, NP   Allergies  Allergen Reactions   Statins Other (See Comments)    Muscle aches   Gabapentin  Other (See Comments)    SOMNOLENCE   Review of Systems  Musculoskeletal:        Left hip pain  Neurological:  Positive for weakness.    Physical Exam Constitutional:      Appearance: She is underweight. She is ill-appearing.  Cardiovascular:     Rate and Rhythm: Normal rate.  Pulmonary:     Effort: Pulmonary effort is normal.  Skin:    General: Skin is warm and dry.  Neurological:     Mental Status: She is lethargic.     Vital Signs: BP 112/61 (BP Location: Right Arm)   Pulse 89   Temp 98.1 F (36.7 C) (Oral)   Resp 20   Ht 4' 11 (1.499 m)   Wt 54.9 kg   SpO2 93%   BMI 24.44 kg/m  Pain Scale: 0-10 POSS *See Group Information*: 1-Acceptable,Awake and alert Pain Score: 9    SpO2: SpO2: 93 % O2 Device:SpO2: 93 % O2 Flow Rate: .O2 Flow Rate (L/min): 2 L/min  IO: Intake/output summary:  Intake/Output Summary (Last 24 hours) at 04/13/2023 1036 Last data filed at 04/13/2023 0900 Gross per 24 hour  Intake 560 ml  Output 150 ml  Net 410 ml    LBM: Last BM Date : 04/09/23 Baseline Weight: Weight: 54.9 kg Most recent weight: Weight: 54.9 kg     Palliative Assessment/Data:  30 % at  best     Time:   75 minutes  Discussed with bedside RN and Dr. Kathrin   Signed by: Sara Plants, NP   Please contact Palliative Medicine Team phone at [phone] for questions and concerns.  For individual provider: See Tracey

## 2023-04-14 DIAGNOSIS — Z7189 Other specified counseling: Secondary | ICD-10-CM | POA: Diagnosis not present

## 2023-04-14 DIAGNOSIS — S72001A Fracture of unspecified part of neck of right femur, initial encounter for closed fracture: Secondary | ICD-10-CM | POA: Diagnosis not present

## 2023-04-14 DIAGNOSIS — M25551 Pain in right hip: Secondary | ICD-10-CM | POA: Diagnosis not present

## 2023-04-14 DIAGNOSIS — R0609 Other forms of dyspnea: Secondary | ICD-10-CM | POA: Diagnosis not present

## 2023-04-14 DIAGNOSIS — Z515 Encounter for palliative care: Secondary | ICD-10-CM | POA: Diagnosis not present

## 2023-04-14 DIAGNOSIS — N179 Acute kidney failure, unspecified: Secondary | ICD-10-CM | POA: Diagnosis not present

## 2023-04-14 LAB — CBC
HCT: 29.3 % — ABNORMAL LOW (ref 36.0–46.0)
Hemoglobin: 9.4 g/dL — ABNORMAL LOW (ref 12.0–15.0)
MCH: 31 pg (ref 26.0–34.0)
MCHC: 32.1 g/dL (ref 30.0–36.0)
MCV: 96.7 fL (ref 80.0–100.0)
Platelets: 218 10*3/uL (ref 150–400)
RBC: 3.03 MIL/uL — ABNORMAL LOW (ref 3.87–5.11)
RDW: 13.7 % (ref 11.5–15.5)
WBC: 8.5 10*3/uL (ref 4.0–10.5)
nRBC: 0 % (ref 0.0–0.2)

## 2023-04-14 LAB — RENAL FUNCTION PANEL
Albumin: 3.1 g/dL — ABNORMAL LOW (ref 3.5–5.0)
Anion gap: 10 (ref 5–15)
BUN: 123 mg/dL — ABNORMAL HIGH (ref 8–23)
CO2: 24 mmol/L (ref 22–32)
Calcium: 8.6 mg/dL — ABNORMAL LOW (ref 8.9–10.3)
Chloride: 98 mmol/L (ref 98–111)
Creatinine, Ser: 3.58 mg/dL — ABNORMAL HIGH (ref 0.44–1.00)
GFR, Estimated: 11 mL/min — ABNORMAL LOW (ref >60)
Glucose, Bld: 103 mg/dL — ABNORMAL HIGH (ref 70–99)
Phosphorus: 7.3 mg/dL — ABNORMAL HIGH (ref 2.5–4.6)
Potassium: 5.3 mmol/L — ABNORMAL HIGH (ref 3.5–5.1)
Sodium: 132 mmol/L — ABNORMAL LOW (ref 135–145)

## 2023-04-14 LAB — MAGNESIUM: Magnesium: 2.8 mg/dL — ABNORMAL HIGH (ref 1.7–2.4)

## 2023-04-14 NOTE — Progress Notes (Signed)
 PROGRESS NOTE  Sara Garza FMW:[id number] DOB: 1923/10/02   PCP: Merna Huxley, NP  Patient is from: Home.  Uses rolling walker at baseline.  DOA: 04/10/2023 LOS: 4  Chief complaints Chief Complaint  Patient presents with   Fall     Brief Narrative / Interim history: 89 year old F with PMH of CAD, diet-controlled DM-2, CKD-4, HFpEF, AS s/p [name], and severe atrial valve stenosis presented to ED with right hip pain and inability to bear weight after she had accidental fall at home.  Reportedly, she was in the kitchen when her walker toppled over and she fell on the right side.  She did not strike her head or lose consciousness.  Workup in ED revealed right periprosthetic hip fracture.  Orthopedic surgery and cardiology consulted.  Surgical repair felt to be high risk given her underlying cardiac comorbidity.  Of note, patient was establishing care with [facility] hospice due to his stage IV CHF before this hospitalization.  Palliative medicine consulted and following.  Patient has AKI with significant azotemia as well.  Subjective: Seen and examined earlier this morning.  No major events overnight of this morning.  Pain fairly controlled.  She did not require much fentanyl  overnight.  She did not like how she feels after she takes fentanyl .  Extensive discussion with patient and patient's daughter at bedside.  Overall, her prognosis and meaningful recovery with quality of life is very slim.  Major focus is on patient's comfort.  We have discussed about disposition.  Undecided about home with home hospice or residential hospice.   Objective: Vitals:   04/13/23 2054 04/14/23 0554 04/14/23 1017 04/14/23 1432  BP:  119/68 134/65 119/65  Pulse: (!) 109 89 99 97  Resp:  18 20 17   Temp:  97.7 F (36.5 C) (!) 97.4 F (36.3 C) 98.2 F (36.8 C)  TempSrc:  Oral Oral   SpO2: 94% 94% 92% 95%  Weight:      Height:        Examination:  GENERAL: No apparent distress.  Nontoxic. HEENT: MMM.   Vision and hearing grossly intact.  NECK: Supple.  No apparent JVD.  RESP:  No IWOB.  Fair aeration bilaterally. CVS:  RRR. Heart sounds normal.  ABD/GI/GU: BS+. Abd soft, NTND.  MSK/EXT:  No edema.  Tender over right hip. SKIN: no apparent skin lesion or wound NEURO: Awake, alert and oriented appropriately.  No apparent focal neuro deficit. PSYCH: Tearful.  Procedures:  None  Microbiology summarized: None  Assessment and plan: Accidental fall at home-did not strike head or lose consciousness.  CT head without acute finding. Right periprosthetic hip fracture due to dental fall at home -Evaluated by Ortho and cardiology-high risk for surgery given advanced underlying cardiac disease. -Per daughter, patient had intake assessment by [facility] hospice before hospitalization -Appreciate help by palliative medicine -Pain control per palliative medicine  Chronic HFpEF: Appears compensated. History of aortic stenosis s/p [name] Severe mitral valve stenosis -TTE in 01/2023 with LVEF of 50 to 55%, G1 DD, severe LAE, severe mitral stenosis with mild to moderate MVR -Continue holding Lasix  -Strict intake and output -Appreciate input by cardiology   AKI on CKD-4: Baseline Cr ~2.0.  AKI likely due to poor p.o. intake.  Creatinine plateaued at 3.5.  BUN elevated to 120s. Recent Labs    02/25/23 0210 02/26/23 0806 02/27/23 0651 02/28/23 0838 03/20/23 1715 04/03/23 1652 04/10/23 1556 04/11/23 0336 04/13/23 0352 04/14/23 0346  BUN 65* 60* 63* 62* 64* 77* 97* 103* 124*  123*  CREATININE 1.95* 2.03* 2.11* 1.93* 2.04* 2.12* 2.56* 2.51* 3.38* 3.58*  -Continue holding home Lasix  -Monitor urine output  Anemia of renal disease: H&H stable Recent Labs    02/15/23 2022 02/16/23 1622 02/17/23 0312 02/19/23 0837 02/26/23 0806 02/27/23 0651 04/10/23 1556 04/11/23 0336 04/13/23 0352 04/14/23 0346  HGB 13.9 11.0* 9.7* 10.4* 9.4* 8.7* 9.5* 9.2* 9.1* 9.4*  -Continue  monitoring  Hypotension: Resolved.  Now normotensive. -Continue holding metoprolol  and Lasix  for now.  Hyperkalemia: Likely due to renal failure.  Mild. -IV fluid as above   Goal of care: DNR.  See discussion above. -Appreciate help by palliative medicine  Body mass index is 24.44 kg/m.          DVT prophylaxis:  SCDs Start: 04/10/23 1709  Code Status: DNR/DNI Family Communication: None at bedside today. Level of care: Med-Surg Status is: Inpatient Remains inpatient appropriate because: Right periprosthetic femoral fracture   Final disposition: Likely residential hospice Consultants:  Orthopedic surgery Cardiology Palliative medicine  55 minutes with more than 50% spent in reviewing records, counseling patient/family and coordinating care.   Sch Meds:  Scheduled Meds:  acetaminophen   1,000 mg Oral Q8H   docusate sodium   100 mg Oral BID   fentaNYL  (SUBLIMAZE ) injection  25 mcg Intravenous Once   Continuous Infusions:   PRN Meds:.albuterol , fentaNYL  (SUBLIMAZE ) injection, hydrOXYzine , lip balm, ondansetron  **OR** ondansetron  (ZOFRAN ) IV, polyethylene glycol, prochlorperazine   Antimicrobials: Anti-infectives (From admission, onward)    None        I have personally reviewed the following labs and images: CBC: Recent Labs  Lab 04/10/23 1556 04/11/23 0336 04/13/23 0352 04/14/23 0346  WBC 9.3 6.2 8.1 8.5  HGB 9.5* 9.2* 9.1* 9.4*  HCT 30.6* 30.1* 29.3* 29.3*  MCV 101.3* 102.0* 99.0 96.7  PLT 197 199 193 218   BMP &GFR Recent Labs  Lab 04/10/23 1556 04/11/23 0336 04/13/23 0352 04/14/23 0346  NA 137 137 135 132*  K 3.8 4.8 5.4* 5.3*  CL 101 101 99 98  CO2 27 25 26 24   GLUCOSE 100* 104* 92 103*  BUN 97* 103* 124* 123*  CREATININE 2.56* 2.51* 3.38* 3.58*  CALCIUM  8.5* 8.5* 8.7* 8.6*  MG  --   --  2.7* 2.8*  PHOS  --   --  7.5* 7.3*   Estimated Creatinine Clearance: 6.8 mL/min (A) (by C-G formula based on SCr of 3.58 mg/dL (H)). Liver &  Pancreas: Recent Labs  Lab 04/13/23 0352 04/14/23 0346  ALBUMIN 3.0* 3.1*   No results for input(s): LIPASE, AMYLASE in the last 168 hours. No results for input(s): AMMONIA in the last 168 hours. Diabetic: No results for input(s): HGBA1C in the last 72 hours. No results for input(s): GLUCAP in the last 168 hours. Cardiac Enzymes: No results for input(s): CKTOTAL, CKMB, CKMBINDEX, TROPONINI in the last 168 hours. No results for input(s): PROBNP in the last 8760 hours. Coagulation Profile: No results for input(s): INR, PROTIME in the last 168 hours. Thyroid  Function Tests: No results for input(s): TSH, T4TOTAL, FREET4, T3FREE, THYROIDAB in the last 72 hours. Lipid Profile: No results for input(s): CHOL, HDL, LDLCALC, TRIG, CHOLHDL, LDLDIRECT in the last 72 hours. Anemia Panel: No results for input(s): VITAMINB12, FOLATE, FERRITIN, TIBC, IRON, RETICCTPCT in the last 72 hours. Urine analysis:    Component Value Date/Time   COLORURINE YELLOW 09/17/2016 1155   APPEARANCEUR Sl Cloudy (A) 09/17/2016 1155   LABSPEC 1.025 09/17/2016 1155   PHURINE 5.0 09/17/2016 1155   GLUCOSEU  NEGATIVE 09/17/2016 1155   HGBUR NEGATIVE 09/17/2016 1155   BILIRUBINUR NEGATIVE 09/17/2016 1155   BILIRUBINUR n 06/18/2016 1016   KETONESUR 15 (A) 09/17/2016 1155   PROTEINUR n 06/18/2016 1016   PROTEINUR NEGATIVE 06/02/2016 1753   UROBILINOGEN 0.2 09/17/2016 1155   NITRITE NEGATIVE 09/17/2016 1155   LEUKOCYTESUR TRACE (A) 09/17/2016 1155   Sepsis Labs: Invalid input(s): PROCALCITONIN, LACTICIDVEN  Microbiology: No results found for this or any previous visit (from the past 240 hours).  Radiology Studies: No results found.    Brooklinn Longbottom T. Giacomo Valone [facility] Garza  If 7PM-7AM, please contact night-coverage [email or web address] 04/14/2023, 3:15 PM

## 2023-04-14 NOTE — Progress Notes (Signed)
 RT Note: Pt. seen per pts. Daughter/RN request, RT Assessment placed, remains AOX3, on 3 lpm n/c-93% Sat, RR-18, b/l b.s slight dim./cl., in np apparent distress.

## 2023-04-14 NOTE — Plan of Care (Signed)
  Problem: Health Behavior/Discharge Planning: Goal: Ability to manage health-related needs will improve Outcome: Progressing   Problem: Clinical Measurements: Goal: Ability to maintain clinical measurements within normal limits will improve Outcome: Progressing Goal: Will remain free from infection Outcome: Progressing Goal: Diagnostic test results will improve Outcome: Progressing Goal: Respiratory complications will improve Outcome: Progressing Goal: Cardiovascular complication will be avoided Outcome: Progressing   Problem: Activity: Goal: Risk for activity intolerance will decrease Outcome: Progressing   Problem: Nutrition: Goal: Adequate nutrition will be maintained Outcome: Progressing   Problem: Coping: Goal: Level of anxiety will decrease Outcome: Progressing   Problem: Elimination: Goal: Will not experience complications related to bowel motility Outcome: Progressing   Problem: Pain Management: Goal: General experience of comfort will improve Outcome: Progressing   Problem: Safety: Goal: Ability to remain free from injury will improve Outcome: Progressing   Problem: Skin Integrity: Goal: Risk for impaired skin integrity will decrease Outcome: Progressing   Problem: Education: Goal: Verbalization of understanding the information provided (i.e., activity precautions, restrictions, etc) will improve Outcome: Progressing Goal: Individualized Educational Video(s) Outcome: Completed/Met   Problem: Activity: Goal: Ability to ambulate and perform ADLs will improve Outcome: Progressing   Problem: Clinical Measurements: Goal: Postoperative complications will be avoided or minimized Outcome: Not Applicable   Problem: Self-Concept: Goal: Ability to maintain and perform role responsibilities to the fullest extent possible will improve Outcome: Progressing   Problem: Pain Management: Goal: Pain level will decrease Outcome: Progressing

## 2023-04-14 NOTE — Progress Notes (Signed)
 Patient ID: Sara Garza, female   DOB: [birth year], 89 y.o.   MRN: [id number]    Progress Note from the Palliative Medicine Team at [facility]   Patient Name: Sara Garza        Date: 04/14/2023 DOB: February 09, 1924  Age: 89 y.o. MRN#: [id number] Attending Physician: Kathrin Mignon DASEN, MD Primary Care Physician: Merna Huxley, NP Admit Date: 04/10/2023   Reason for Consultation/Follow-up   Establishing Goals of Care   HPI/ Brief Hospital Review  89 y.o. female   admitted on 04/10/2023 with  PMH of CAD, diet-controlled DM-2,  HFpEF, AS s/p [name], and severe atrial valve stenosis presented to ED with right hip pain and inability to bear weight after she had accidental fall at home.  Reportedly,  she did not strike her head or lose consciousness.  Workup in ED revealed right periprosthetic hip fracture.     Orthopedic surgery consulted.  Given her underlying significant medical co-morbidities, any consideration of surgical intervention would be associated with relatively high potential morbidity or mortality.   Patient and her daughter have made decision for conservative medical management and focus now on comfort and dignity.     Subjective  Extensive chart review has been completed prior to meeting with patient/family  including labs, vital signs, imaging, progress/consult notes, orders, medications and available advance directive documents.    This NP assessed patient at the bedside as a follow up to  yesterday's GOCs meeting, and to assess for palliative medicine needs and emotional support.  Continued education regarding decisions for this patient for end-of-life care.  And today education offered on the details of a full comfort path, patient continues to express to her daughter her wishes for comfort and dignity allowing for natural death.  Education offered on the natural trajectory and expectations at end-of-life.  I verbalized my concern that patient is likely going to have a rapid  decline over the next days to weeks,  secondary to increased symptom management needs/pain and dyspnea, immobility and increasing lethargy, and  poor p.o. intake.   Patient today discussed this with her daughter her wish for residential hospice at this time.  Placed referral to offer choice.  Plan of care---focus of care is comfort and dignity allowing for natural death -DNR DNI -No artificial feeding or hydration now or in the future -No surgical intervention for hip fracture, diagnostics, labs -Symptom management: Pain/dyspnea-Fentanyl  25 mcg IV scheduled every 4 hours and every 2 hours as needed                            -Tylenol  1000 mg p.o. every 8 hours scheduled Nausea-Zofran  4 mg IV every 6 hours as needed                     Compazine  5 mg IV every 6 hours as needed                     Crackers and ginger ale/this helps her at home -Residential hospice for end-of-life care-patient has high symptom burden and a likely prognosis of less than 2 weeks  Patient continues to be hesitant to utilize as needed medications for symptoms.  Ongoing education regarding utilization of medications at end-of-life   Education offered today regarding  the importance of continued conversation with family and their  medical providers regarding overall plan of care and treatment options,  ensuring decisions are within the  context of the patients values and GOCs.  Questions and concerns addressed   Discussed with primary team and nursing staff   Time: 55  minutes  Detailed review of medical records ( labs, imaging, vital signs), medically appropriate exam ( MS, skin, cardiac,  resp)   discussed with treatment team, counseling and education to patient, family, staff, documenting clinical information, medication management, coordination of care    Ronal Plants NP  Palliative Medicine Team Team Phone # [phone] Pager [phone]

## 2023-04-14 NOTE — Progress Notes (Addendum)
 Patient is considered high risk for surgery due to cardiac complications including stage 4 CHF. Patient was establishing care with [facility] hospice due to stage 4 CHF prior to hospitalization. Palliative medicine consulted and following. In regards to right hip periprosthetic fracture non-operative treatment recommended. Patient to remain TDWB RLE. PT and OT for transfers. Patient can follow-up with Dr. Fidel in 2 weeks for repeat evaluation.

## 2023-04-15 ENCOUNTER — Ambulatory Visit: Payer: Medicare Other | Admitting: Adult Health

## 2023-04-15 DIAGNOSIS — Z515 Encounter for palliative care: Secondary | ICD-10-CM | POA: Diagnosis not present

## 2023-04-15 DIAGNOSIS — M25551 Pain in right hip: Secondary | ICD-10-CM | POA: Diagnosis not present

## 2023-04-15 DIAGNOSIS — R0609 Other forms of dyspnea: Secondary | ICD-10-CM

## 2023-04-15 MED ORDER — SENNOSIDES-DOCUSATE SODIUM 8.6-50 MG PO TABS
1.0000 | ORAL_TABLET | Freq: Two times a day (BID) | ORAL | Status: AC
  Administered 2023-04-15 (×2): 1 via ORAL
  Filled 2023-04-15 (×2): qty 1

## 2023-04-15 MED ORDER — POLYETHYLENE GLYCOL 3350 17 G PO PACK
17.0000 g | PACK | Freq: Two times a day (BID) | ORAL | Status: AC
  Administered 2023-04-15: 17 g via ORAL
  Filled 2023-04-15: qty 1

## 2023-04-15 MED ORDER — POLYETHYLENE GLYCOL 3350 17 G PO PACK
17.0000 g | PACK | Freq: Two times a day (BID) | ORAL | Status: DC | PRN
  Administered 2023-04-17: 17 g via ORAL
  Filled 2023-04-15: qty 1

## 2023-04-15 MED ORDER — SENNOSIDES-DOCUSATE SODIUM 8.6-50 MG PO TABS: 1.0000 | ORAL_TABLET | Freq: Two times a day (BID) | ORAL | Status: DC | PRN

## 2023-04-15 MED ORDER — PANTOPRAZOLE SODIUM 40 MG PO TBEC
40.0000 mg | DELAYED_RELEASE_TABLET | Freq: Every day | ORAL | Status: DC
  Administered 2023-04-15 – 2023-04-17 (×3): 40 mg via ORAL
  Filled 2023-04-15 (×4): qty 1

## 2023-04-15 MED ORDER — TRAMADOL HCL 50 MG PO TABS: 50.0000 mg | ORAL_TABLET | Freq: Two times a day (BID) | ORAL | Status: DC | PRN

## 2023-04-15 NOTE — Consult Note (Signed)
 [facility] Liaison Consult Note   04/15/2023  Lorina Duffner [facility] Nov 09, 1923 [id number]  [facility] Liaison remote coverage electronic medical record review for patient admitted to [facility]: Medicare ACO REACH  Primary Care Provider:  Merna Huxley, NP with [facility] at [place], this provider is listed for the transition of care follow up and TOC follow up call.  Chart reviewed  for 5 day LOS, as reviewed from Inpatient TOC LCSW and Palliative consult notes and reveals the patient is currently transitioning to comfort care measure in preparation for home with Hospice care.  Plan: Patient will have full care coordination services through Hospice and needs will be met at the hospice level of care. No planned follow up for transitional needs. Patient's review currently with [facility] is  noted.  Will sign off at transition from hospital.  For questions,   Richerd Fish, RN, BSN, CCM [place]  [facility], [facility] Liaison Direct Dial: [phone] or secure chat Email: [email or web address]

## 2023-04-15 NOTE — Plan of Care (Signed)
  Problem: Health Behavior/Discharge Planning: Goal: Ability to manage health-related needs will improve Outcome: Progressing   Problem: Clinical Measurements: Goal: Ability to maintain clinical measurements within normal limits will improve Outcome: Progressing Goal: Will remain free from infection Outcome: Progressing Goal: Diagnostic test results will improve Outcome: Progressing Goal: Respiratory complications will improve Outcome: Progressing Goal: Cardiovascular complication will be avoided Outcome: Progressing   Problem: Activity: Goal: Risk for activity intolerance will decrease Outcome: Progressing   Problem: Nutrition: Goal: Adequate nutrition will be maintained Outcome: Progressing   Problem: Coping: Goal: Level of anxiety will decrease Outcome: Progressing   Problem: Elimination: Goal: Will not experience complications related to bowel motility Outcome: Progressing   Problem: Pain Management: Goal: General experience of comfort will improve Outcome: Progressing   Problem: Safety: Goal: Ability to remain free from injury will improve Outcome: Progressing   Problem: Skin Integrity: Goal: Risk for impaired skin integrity will decrease Outcome: Progressing   Problem: Education: Goal: Verbalization of understanding the information provided (i.e., activity precautions, restrictions, etc) will improve Outcome: Progressing   Problem: Activity: Goal: Ability to ambulate and perform ADLs will improve Outcome: Progressing   Problem: Self-Concept: Goal: Ability to maintain and perform role responsibilities to the fullest extent possible will improve Outcome: Progressing   Problem: Pain Management: Goal: Pain level will decrease Outcome: Progressing

## 2023-04-15 NOTE — TOC Initial Note (Signed)
 Transition of Care [facility]) - Initial/Assessment Note    Patient Details  Name: Sara Garza MRN: [id number] Date of Birth: February 22, 1924  [facility] of [facility]) CM/SW Contact:    NORMAN ASPEN, LCSW Phone Number: 04/15/2023, 11:08 AM  Clinical Narrative:                  Met with pt and daughter, Sara Garza, today to confirm dc needs/ plans.  Both report they would like to dc home, daughter to stay with pt, and have hospice services in the home.  Daughter requests the hospice agency coverage be changed from [facility] to [facility].  Have alerted both agencies and [facility] will follow up with daughter to coordinate dc needs/ DME, etc. Expected Discharge Plan: Home w Hospice Care Barriers to Discharge: Continued Medical Work up   Patient Goals and CMS Choice Patient states their goals for this hospitalization and ongoing recovery are:: return home          Expected Discharge Plan and Services In[place] Referral: Clinical Social Work, Hospice / Palliative Care   Post Acute Care Choice: Hospice Living arrangements for the past 2 months: Single Family Home                           HH Arranged: RN, PT [facility] Agency: [facility] [place] [facility]) Date HH Agency Contacted: 04/15/23 Time HH Agency Contacted: 1107 Representative spoke with at [facility] Agency: Elouise Husband  Prior Living Arrangements/Services Living arrangements for the past 2 months: Single Family Home Lives with:: Self Patient language and need for interpreter reviewed:: Yes Do you feel safe going back to the place where you live?: Yes      Need for Family Participation in Patient Care: Yes (Comment) Care giver support system in place?: Yes (comment)   Criminal Activity/Legal Involvement Pertinent to Current Situation/Hospitalization: No - Comment as needed  Activities of Daily Living   ADL Screening (condition at time of admission) Independently performs ADLs?: No Does the  patient have a NEW difficulty with bathing/dressing/toileting/self-feeding that is expected to last >3 days?: Yes (Initiates electronic notice to provider for possible OT consult) Does the patient have a NEW difficulty with getting in/out of bed, walking, or climbing stairs that is expected to last >3 days?: Yes (Initiates electronic notice to provider for possible PT consult) Does the patient have a NEW difficulty with communication that is expected to last >3 days?: No Is the patient deaf or have difficulty hearing?: No Does the patient have difficulty seeing, even when wearing glasses/contacts?: No Does the patient have difficulty concentrating, remembering, or making decisions?: No  Permission Sought/Granted Permission sought to share information with : Family Supports Permission granted to share information with : Yes, Verbal Permission Granted  Share Information with NAME: daughter, Sara Garza @ [phone]           Emotional Assessment Appearance:: Appears stated age Attitude/Demeanor/Rapport: Gracious Affect (typically observed): Accepting Orientation: : Oriented to Self, Oriented to Place, Oriented to  Time, Oriented to Situation Alcohol  / Substance Use: Not Applicable Psych Involvement: No (comment)  Admission diagnosis:  Hip fracture (HCC) [S72.009A] Patient Active Problem List   Diagnosis Date Noted   Trigger finger, acquired 12/19/2021   AC (acromioclavicular) arthritis 06/27/2021   Right rotator cuff tear arthropathy 05/16/2021   Acute CHF (congestive heart failure) (HCC) 02/14/2020   Acute blood loss anemia 01/03/2020   Eye irritation 01/01/2020  Mitral valve stenosis    Benign essential HTN    Chronic diastolic CHF (congestive heart failure) (HCC)    Hip fracture (HCC) 12/30/2019   Closed fracture of femur, intertrochanteric, left, initial encounter (HCC)    Acute on chronic diastolic CHF (congestive heart failure) (HCC) 02/25/2018   S/P [name] (transcatheter  aortic valve replacement) 02/25/2018   Leg pain    Hyperlipidemia    Glaucoma    Carotid stenosis, left    Arthritis    Urinary incontinence 02/12/2017   Chronic bilateral low back pain 08/12/2016   Spondylosis of lumbar joint 08/12/2016   Bilateral leg pain 08/12/2016   Neck mass 04/15/2015   Status post hip hemiarthroplasty 07/31/2013   CAD (coronary artery disease) 07/31/2013   CKD (chronic kidney disease) stage 4, GFR 15-29 ml/Garza (HCC) 07/31/2013   Left bundle branch block 07/31/2013   Severe calcific aortic stenosis 06/18/2010   Diabetic polyneuropathy (HCC) 04/16/2010   GLAUCOMA 01/06/2009   CHEST PAIN, ATYPICAL 12/16/2008   Bilateral shoulder pain 11/16/2007   Osteoarthritis 02/02/2007   Type 2 diabetes mellitus with hyperlipidemia (HCC) 10/24/2006   Dyslipidemia 10/02/2006   Essential hypertension 10/02/2006   PCP:  Merna Huxley, NP Pharmacy:   [facility] [place], [place] - [address] [facility] [address] [place] [address] Phone: [phone] Fax: [phone]  [id number] [place], [place] - [address] AT [facility] [address] [facility] [address] [place] [address] Phone: [phone] Fax: [phone]     Social Drivers of Health (SDOH) Social History: SDOH Screenings   Food Insecurity: No Food Insecurity (04/10/2023)  Housing: Low Risk  (04/10/2023)  Transportation Needs: No Transportation Needs (04/10/2023)  Utilities: Not At Risk (04/10/2023)  Alcohol  Screen: Low Risk  (05/22/2022)  Depression (PHQ2-9): Low Risk  (05/22/2022)  Financial Resource Strain: Low Risk  (05/22/2022)  Physical Activity: Inactive (05/22/2022)  Social Connections: Moderately Integrated (04/10/2023)  Stress: No Stress Concern Present (05/22/2022)  Tobacco Use: Medium Risk (04/10/2023)   SDOH Interventions:     Readmission Risk Interventions    04/15/2023   11:06 AM  Readmission Risk Prevention Plan  Transportation  Screening Complete  PCP or Specialist Appt within 3-5 Days Complete  HRI or Home Care Consult Complete  Social Work Consult for Recovery Care Planning/Counseling Complete  Palliative Care Screening Complete  Medication Review [employer]) Complete

## 2023-04-15 NOTE — Progress Notes (Addendum)
 PROGRESS NOTE  Sara Garza DOB: 1924/03/08   PCP: Merna Huxley, NP  Patient is from: Home.  Uses rolling walker at baseline.  DOA: 04/10/2023 LOS: 5  Chief complaints Chief Complaint  Patient presents with   Fall     Brief Narrative / Interim history: 89 year old F with PMH of CAD, diet-controlled DM-2, CKD-4, HFpEF, AS s/p [name], and severe atrial valve stenosis presented to ED with right hip pain and inability to bear weight after she had accidental fall at home.  Reportedly, she was in the kitchen when her walker toppled over and she fell on the right side.  She did not strike her head or lose consciousness.  Workup in ED revealed right periprosthetic hip fracture.  Orthopedic surgery and cardiology consulted, and surgical repair felt to be high risk given her underlying cardiac comorbidity.  Patient has AKI with significant azotemia as well. Palliative medicine consulted and following.  Plan is discharge home with home hospice.  TOC consulted for hospice referral.   Subjective: Seen and examined earlier this morning.  No major events overnight of this morning.  Patient reports pain is fairly controlled.  She did not require opiate of fentanyl  overnight.  She also felt short of breath after IV fentanyl  yesterday afternoon.  Denies nausea or vomiting.  Does not recall last bowel movements.  Denies abdominal pain.  Had extensive discussion with patient's daughter later in the morning.  Discussed about disposition.  Daughter thinks patient will feel better mentally if she goes home.  She is not requiring a lot of pain medications.  She can be transferred to residential hospice from home if she requires more care.   Objective: Vitals:   04/14/23 1017 04/14/23 1432 04/15/23 0706 04/15/23 0708  BP: 134/65 119/65 124/65   Pulse: 99 97 99   Resp: 20 17 16    Temp: (!) 97.4 F (36.3 C) 98.2 F (36.8 C)  98.4 F (36.9 C)  TempSrc: Oral   Oral  SpO2: 92% 95% 97%   Weight:       Height:        Examination:  GENERAL: No apparent distress.  Nontoxic. HEENT: MMM.  Vision and hearing grossly intact.  NECK: Supple.  No apparent JVD.  RESP:  No IWOB.  Fair aeration bilaterally. CVS:  RRR. Heart sounds normal.  ABD/GI/GU: BS+. Abd soft, NTND.  MSK/EXT:  No edema.  Tender over right hip. SKIN: no apparent skin lesion or wound NEURO: Awake, alert and oriented appropriately.  No apparent focal neuro deficit. PSYCH: Tearful.  Procedures:  None  Microbiology summarized: None  Assessment and plan: Accidental fall at home-did not strike head or lose consciousness.  CT head without acute finding. Right periprosthetic hip fracture due to dental fall at home -Evaluated by Ortho and cardiology-high risk for surgery given advanced underlying cardiac disease. -Per daughter, patient had intake assessment by [facility] hospice before hospitalization -Appreciate help by palliative medicine -Pain fairly controlled.  Did not require IV fentanyl  overnight. -Added tramadol  50 mg twice daily for moderate pain.  Continue scheduled Tylenol . -Disposition Home with home hospice.  TOC consulted.  See discussion with patient's daughter above  Chronic HFpEF: Appears compensated. History of aortic stenosis s/p [name] Severe mitral valve stenosis -TTE in 01/2023 with LVEF of 50 to 55%, G1 DD, severe LAE, severe mitral stenosis with mild to moderate MVR -Continue holding Lasix  -Strict intake and output -Appreciate input by cardiology   AKI on CKD-4: Baseline Cr ~2.0.  AKI  likely due to poor p.o. intake.  Creatinine plateaued at 3.5.  BUN elevated to 120s. Recent Labs    02/25/23 0210 02/26/23 0806 02/27/23 0651 02/28/23 0838 03/20/23 1715 04/03/23 1652 04/10/23 1556 04/11/23 0336 04/13/23 0352 04/14/23 0346  BUN 65* 60* 63* 62* 64* 77* 97* 103* 124* 123*  CREATININE 1.95* 2.03* 2.11* 1.93* 2.04* 2.12* 2.56* 2.51* 3.38* 3.58*  -Continue holding home Lasix   Anemia of renal  disease: H&H stable Recent Labs    02/15/23 2022 02/16/23 1622 02/17/23 0312 02/19/23 0837 02/26/23 0806 02/27/23 0651 04/10/23 1556 04/11/23 0336 04/13/23 0352 04/14/23 0346  HGB 13.9 11.0* 9.7* 10.4* 9.4* 8.7* 9.5* 9.2* 9.1* 9.4*  -Continue monitoring  Hypotension: Resolved.  Now normotensive. -Continue holding metoprolol  and Lasix  for now.  Hyperkalemia: Likely due to renal failure.  Mild. -IV fluid as above  Constipation -Scheduled MiraLAX  and Senokot for 2 doses followed by as needed  Chronic nausea: Multifactorial including renal failure and opiate. -Antiemetics -P.o. Protonix    Goal of care: DNR.  See discussion above. -Appreciate help by palliative medicine  Body mass index is 24.44 kg/m.          DVT prophylaxis:  SCDs Start: 04/10/23 1709  Code Status: DNR/DNI Family Communication: Updated patient's daughter in person. Level of care: Med-Surg Status is: Inpatient Remains inpatient appropriate because: Right periprosthetic femoral fracture/comfort care   Final disposition: Home with home hospice Consultants:  Orthopedic surgery Cardiology Palliative medicine  55 minutes with more than 50% spent in reviewing records, counseling patient/family and coordinating care.   Sch Meds:  Scheduled Meds:  acetaminophen   1,000 mg Oral Q8H   docusate sodium   100 mg Oral BID   fentaNYL  (SUBLIMAZE ) injection  25 mcg Intravenous Once   pantoprazole   40 mg Oral Daily   polyethylene glycol  17 g Oral BID   senna-docusate  1 tablet Oral BID   Continuous Infusions:   PRN Meds:.albuterol , fentaNYL  (SUBLIMAZE ) injection, hydrOXYzine , lip balm, ondansetron  **OR** ondansetron  (ZOFRAN ) IV, polyethylene glycol **FOLLOWED BY** [START ON 04/16/2023] polyethylene glycol, prochlorperazine , senna-docusate **FOLLOWED BY** [START ON 04/16/2023] senna-docusate, traMADol   Antimicrobials: Anti-infectives (From admission, onward)    None        I have personally  reviewed the following labs and images: CBC: Recent Labs  Lab 04/10/23 1556 04/11/23 0336 04/13/23 0352 04/14/23 0346  WBC 9.3 6.2 8.1 8.5  HGB 9.5* 9.2* 9.1* 9.4*  HCT 30.6* 30.1* 29.3* 29.3*  MCV 101.3* 102.0* 99.0 96.7  PLT 197 199 193 218   BMP &GFR Recent Labs  Lab 04/10/23 1556 04/11/23 0336 04/13/23 0352 04/14/23 0346  NA 137 137 135 132*  K 3.8 4.8 5.4* 5.3*  CL 101 101 99 98  CO2 27 25 26 24   GLUCOSE 100* 104* 92 103*  BUN 97* 103* 124* 123*  CREATININE 2.56* 2.51* 3.38* 3.58*  CALCIUM  8.5* 8.5* 8.7* 8.6*  MG  --   --  2.7* 2.8*  PHOS  --   --  7.5* 7.3*   Estimated Creatinine Clearance: 6.8 mL/min (A) (by C-G formula based on SCr of 3.58 mg/dL (H)). Liver & Pancreas: Recent Labs  Lab 04/13/23 0352 04/14/23 0346  ALBUMIN 3.0* 3.1*   No results for input(s): LIPASE, AMYLASE in the last 168 hours. No results for input(s): AMMONIA in the last 168 hours. Diabetic: No results for input(s): HGBA1C in the last 72 hours. No results for input(s): GLUCAP in the last 168 hours. Cardiac Enzymes: No results for input(s): CKTOTAL, CKMB, CKMBINDEX,  TROPONINI in the last 168 hours. No results for input(s): PROBNP in the last 8760 hours. Coagulation Profile: No results for input(s): INR, PROTIME in the last 168 hours. Thyroid  Function Tests: No results for input(s): TSH, T4TOTAL, FREET4, T3FREE, THYROIDAB in the last 72 hours. Lipid Profile: No results for input(s): CHOL, HDL, LDLCALC, TRIG, CHOLHDL, LDLDIRECT in the last 72 hours. Anemia Panel: No results for input(s): VITAMINB12, FOLATE, FERRITIN, TIBC, IRON, RETICCTPCT in the last 72 hours. Urine analysis:    Component Value Date/Time   COLORURINE YELLOW 09/17/2016 1155   APPEARANCEUR [place] Cloud[place] (A) 09/17/2016 1155   LABSPEC 1.025 09/17/2016 1155   PHURINE 5.0 09/17/2016 1155   GLUCOSEU NEGATIVE 09/17/2016 1155   HGBUR NEGATIVE 09/17/2016 1155    BILIRUBINUR NEGATIVE 09/17/2016 1155   BILIRUBINUR n 06/18/2016 1016   KETONESUR 15 (A) 09/17/2016 1155   PROTEINUR n 06/18/2016 1016   PROTEINUR NEGATIVE 06/02/2016 1753   UROBILINOGEN 0.2 09/17/2016 1155   NITRITE NEGATIVE 09/17/2016 1155   LEUKOCYTESUR TRACE (A) 09/17/2016 1155   Sepsis Labs: Invalid input(s): PROCALCITONIN, LACTICIDVEN  Microbiology: No results found for this or any previous visit (from the past 240 hours).  Radiology Studies: No results found.    Yuvonne Lanahan T. Terriyah Westra [facility]  If 7PM-7AM, please contact night-coverage [email or web address] 04/15/2023, 11:24 AM

## 2023-04-15 NOTE — Progress Notes (Signed)
 [facility] Liaison Note  Received request from [place], Transitions of Care Manager, for hospice services at home after discharge. Spoke with Darice, daughter to initiate education related to hospice philosophy, services, and team approach to care. Patient/family verbalized understanding of information given. Per discussion, the plan is for discharge home, date to be determined.   DME needs discussed. Patient has oxygen, BSC, walker, and shower chair in the home. Patient/family requests the following equipment for delivery: transport chair and geri chair if available. The address has been verified and is correct in the chart. Darice is the family contact to arrange time of equipment delivery.  Please send signed and completed DNR home with patient/family. Please provide prescriptions at discharge as needed to ensure ongoing symptom management.   [facility] information and contact numbers given to Darice. Above information shared with [place], Transitions of Care Manager. Please call with any questions or concerns.  Thank you for the opportunity to participate in this patient's care.   Elouise Husband BSN, CHARITY FUNDRAISER, OCN [employer] [phone]

## 2023-04-15 NOTE — Evaluation (Signed)
 Physical Therapy Evaluation Patient Details Name: Sara Garza MRN: [id number] DOB: September 04, 1923 Today's Date: 04/15/2023  History of Present Illness  Pt is 89 yo female  admitted on 04/10/2023 with inability to bear wt on RLE after fall.  Workup in ED revealed right periprosthetic hip fracture. ortho consulted and recomeneded nonoperative management d/t pt high risk for surgery.  PMH - CAD, CKD, DM, HTN, CHF, [name], Bil THR  Clinical Impression  Pt admitted with above diagnosis.  Pt and dtr report pt was independent/mod I  prior to admission, amb household distances with RW. Caregivers assisted with ADLs. Pt has assist/supervision 50% of time currently and family working on arranging 24 hour care in addition to hospice services.  Pt able to perform partial STS with max assist thissession, per nursing staff pt was 2 person total assist for bed to chair transfer. Educated pt and dtr on pressure relief in chair, noted stage 1 skin breakdown over thoracic spine and pt placed mepilex over skin erythmatous region and positioned with pillows to offload area, RN made aware. Pt may need hospital bed, hoyer lift and w/c with ELRs for home (physical transfers to transport may be difficult d/t pt  petite stature, TDWB and location of fx. Caregivers would need to provide 2 person total assist at pt current status.   Pt currently with functional limitations due to the deficits listed below (see PT Problem List). Pt will benefit from acute skilled PT to increase their independence and safety with mobility to allow discharge.           If plan is discharge home, recommend the following: Two people to help with walking and/or transfers;Two people to help with bathing/dressing/bathroom;Help with stairs or ramp for entrance;Assist for transportation;Assistance with cooking/housework   Can travel by Doctor, Hospital (measurements PT);Wheelchair cushion (measurements  PT);Hospital bed;Hoyer lift  Recommendations for Other Services       Functional Status Assessment Patient has had a recent decline in their functional status and demonstrates the ability to make significant improvements in function in a reasonable and predictable amount of time.     Precautions / Restrictions Precautions Precautions: Fall Restrictions RLE Weight Bearing Per Provider Order: Touchdown weight bearing      Mobility  Bed Mobility               General bed mobility comments: pt in recliner on arrival (total assist of 2 for bed to chair transfer per nursing staff)    Transfers Overall transfer level: Needs assistance Equipment used: Rolling walker (2 wheels) Transfers: Sit to/from Stand Sit to Stand: Max assist           General transfer comment: attempted STS x2, pt requiring max assist for lift off from chair using UEs and LLE, improved knee and hip extension with second trial however unable to come to full stand    Ambulation/Gait               General Gait Details: deferred  Stairs            Wheelchair Mobility     Tilt Bed    Modified Rankin (Stroke Patients Only)       Balance Overall balance assessment: Needs assistance, History of Falls Sitting-balance support: Feet supported, No upper extremity supported Sitting balance-Leahy Scale: Fair Sitting balance - Comments: Fair to good - able to wt shift however limited by pain RLE  Pertinent Vitals/Pain Pain Assessment Pain Assessment: Faces Faces Pain Scale: Hurts little more Pain Location: R thigh Pain Descriptors / Indicators: Grimacing, Sore Pain Intervention(s): Limited activity within patient's tolerance, Monitored during session, Premedicated before session, Repositioned    Home Living Family/patient expects to be discharged to:: Private residence Living Arrangements: Alone Available Help at Discharge:  Family;Personal care attendant;Available 24 hours/day (family sorting out providing 24 hour assist at home) Type of Home: [place] Home Access: Level entry       Home Layout: One level Home Equipment: Agricultural Consultant (2 wheels);Shower seat;BSC/3in1      Prior Function Prior Level of Function : Needs assist       Physical Assist : ADLs (physical)     Mobility Comments: pt amb with RW mod I prior to fall; typically did not need assist with bed mobility or transfers ADLs Comments: Caregiver comes in to assist with bathing. Also has someone that comes to clean every 3 weeks. friend often provides meals and transportat in to appointments     Extremity/Trunk Assessment   Upper Extremity Assessment Upper Extremity Assessment: RUE deficits/detail;LUE deficits/detail RUE Deficits / Details: ltd shoulder flexion at baseline; flexion ~ 70 degrees, 70-80 degrees fwd flexion painful; AROM grossly WFL elbow, wrist hand with arthritic changes noted; strength 3/5 grossly RUE: Shoulder pain with ROM LUE Deficits / Details: A/AROM shoulder flexion to ~ 100 degrees, elbow, wrist and hand grossly WFL with arthritic changes noted; strength 3+/5 grossly    Lower Extremity Assessment Lower Extremity Assessment: RLE deficits/detail;LLE deficits/detail RLE Deficits / Details: NT at knee and hip d/t femur-periprosthetic fx; ankle AROM and strength grossly WFL RLE: Unable to fully assess due to pain LLE Deficits / Details: A/AROM grossly WFL; strength 3 to 3+/5       Communication   Communication Communication: Hearing impairment  Cognition Arousal: Alert Behavior During Therapy: WFL for tasks assessed/performed Overall Cognitive Status: Within Functional Limits for tasks assessed                                          General Comments      Exercises General Exercises - Lower Extremity Ankle Circles/Pumps: AROM, Both, 10 reps Long Arc Quad: AROM, Left, 5 reps Heel Slides:  AROM, Left, 5 reps   Assessment/Plan    PT Assessment Patient needs continued PT services  PT Problem List Decreased strength;Decreased activity tolerance;Decreased balance;Decreased mobility;Pain;Decreased skin integrity;Decreased range of motion       PT Treatment Interventions DME instruction;Functional mobility training;Therapeutic activities;Therapeutic exercise;Patient/family education    PT Goals (Current goals can be found in the Care Plan section)  Acute Rehab PT Goals Patient Stated Goal: mobilize as much as able PT Goal Formulation: With patient/family Time For Goal Achievement: 04/29/23 Potential to Achieve Goals: Fair    Frequency Min 1X/week     Co-evaluation               AM-PAC PT 6 Clicks Mobility  Outcome Measure Help needed turning from your back to your side while in a flat bed without using bedrails?: Total Help needed moving from lying on your back to sitting on the side of a flat bed without using bedrails?: Total Help needed moving to and from a bed to a chair (including a wheelchair)?: Total Help needed standing up from a chair using your arms (e.g., wheelchair or bedside chair)?: Total Help needed  to walk in hospital room?: Total Help needed climbing 3-5 steps with a railing? : Total 6 Click Score: 6    End of Session Equipment Utilized During Treatment: Gait belt Activity Tolerance: Patient limited by fatigue;Patient limited by pain Patient left: in chair;with call bell/phone within reach;with chair alarm set;with family/visitor present   PT Visit Diagnosis: Other abnormalities of gait and mobility (R26.89);Muscle weakness (generalized) (M62.81);History of falling (Z91.81)    Time: 8395-8354 PT Time Calculation (min) (ACUTE ONLY): 41 min   Charges:     PT Treatments $Therapeutic Activity: 23-37 mins PT General Charges $$ ACUTE PT VISIT: 1 Visit         Lord Lancour, PT  Acute Rehab Dept [facility])  [phone]  04/15/2023   [facility] 04/15/2023, 5:13 PM

## 2023-04-16 DIAGNOSIS — S72001D Fracture of unspecified part of neck of right femur, subsequent encounter for closed fracture with routine healing: Secondary | ICD-10-CM | POA: Diagnosis not present

## 2023-04-16 MED ORDER — TRAMADOL HCL 50 MG PO TABS: 25.0000 mg | ORAL_TABLET | Freq: Four times a day (QID) | ORAL | 0 refills | Status: DC | PRN

## 2023-04-16 MED ORDER — METOPROLOL TARTRATE 12.5 MG HALF TABLET
12.5000 mg | ORAL_TABLET | Freq: Two times a day (BID) | ORAL | Status: DC
  Administered 2023-04-16 – 2023-04-17 (×3): 12.5 mg via ORAL
  Filled 2023-04-16 (×3): qty 1

## 2023-04-16 MED ORDER — FUROSEMIDE 40 MG PO TABS
40.0000 mg | ORAL_TABLET | Freq: Every day | ORAL | Status: DC
  Administered 2023-04-16 – 2023-04-17 (×2): 40 mg via ORAL
  Filled 2023-04-16 (×2): qty 1

## 2023-04-16 NOTE — Progress Notes (Addendum)
 PROGRESS NOTE  Sara Garza:[id number] DOB: 1923-10-22 DOA: 04/10/2023 PCP: Alto Atta, NP   LOS: 6 days   Brief Narrative / Interim history: 89 year old F with PMH of CAD, diet-controlled DM-2, CKD-4, HFpEF, AS s/p [name], and severe atrial valve stenosis presented to ED with right hip pain and inability to bear weight after she had accidental fall at home.  Reportedly, she was in the kitchen when her walker toppled over and she fell on the right side.  She did not strike her head or lose consciousness.  Workup in ED revealed right periprosthetic hip fracture.  Orthopedic surgery and cardiology consulted, and surgical repair felt to be high risk given her underlying cardiac comorbidity.  Patient has AKI with significant azotemia as well. Palliative medicine consulted and following.  Plan is discharge home with home hospice.  TOC consulted for hospice referral.   Subjective / 24h Interval events: She is doing well this morning, pain is controlled.  Assesement and Plan: Principal problem Right periprosthetic hip fracture - due to mechanical fall at home.  Evaluated by orthopedics as well as cardiology, she was felt to be high risk for surgery given underlying advanced cardiac disease.  Palliative care consulted, given current trajectory home hospice would be recommended and this will be set up prior to patient's discharge.  Pain seems to be controlled.  Awaiting home hospice to be arranged, all DME's, anticipate discharge home tomorrow  Active problems Chronic HFpEF, History of aortic stenosis s/p [name], Severe mitral valve stenosis -TTE in 01/2023 with LVEF of 50 to 55%, G1 DD, severe LAE, severe mitral stenosis with mild to moderate MVR.  AKI on CKD-4 - Baseline Cr ~2.0.  AKI likely due to poor p.o. intake.  Creatinine plateaued at 3.5.  BUN elevated to 120s. Anemia of renal disease - H&H stable  Hypotension - Resolved.    Hyperkalemia -Due to AKI Constipation -bowel regimen Chronic  nausea- improved Goal of care - DNR.  Home with hospice  Scheduled Meds:  acetaminophen   1,000 mg Oral Q8H   docusate sodium   100 mg Oral BID   fentaNYL  (SUBLIMAZE ) injection  25 mcg Intravenous Once   pantoprazole   40 mg Oral Daily   Continuous Infusions: PRN Meds:.albuterol , fentaNYL  (SUBLIMAZE ) injection, hydrOXYzine , lip balm, ondansetron  **OR** ondansetron  (ZOFRAN ) IV, [EXPIRED] polyethylene glycol **FOLLOWED BY** polyethylene glycol, prochlorperazine , [COMPLETED] senna-docusate **FOLLOWED BY** senna-docusate, traMADol   Current Outpatient Medications  Medication Instructions   acetaminophen  (TYLENOL ) 650 mg, Oral, Every 6 hours PRN   aspirin  EC 81 mg, Oral, Daily, Swallow whole.    Calcium  Carb-Cholecalciferol 500-400 MG-UNIT TABS 1 tablet, Oral, Daily   ezetimibe  (ZETIA ) 10 mg, Oral, Daily   furosemide  (LASIX ) 40 MG tablet Take 2 tablets (80mg ) by mouth daily for 3 days, then decrease to 1 tablet daily thereafter   hydrOXYzine  (ATARAX ) 10 mg, Oral, At bedtime PRN   metoprolol  tartrate (LOPRESSOR ) 12.5 mg, Oral, 2 times daily   Multiple Vitamin (MULTIVITAMIN) tablet 1 tablet, Oral, Daily,     Multiple Vitamins-Minerals (PRESERVISION AREDS 2+MULTI VIT PO) 2 capsules, Oral, 2 times daily   ondansetron  (ZOFRAN ) 4 mg, Oral, Every 8 hours PRN   polyethylene glycol (MIRALAX  / GLYCOLAX ) 17 g, Oral, Daily PRN   traMADol  (ULTRAM ) 25 mg, Oral, Every 6 hours PRN    Diet Orders (From admission, onward)     Start     Ordered   04/11/23 0739  Diet Heart Room service appropriate? Yes; Fluid consistency: Thin  Diet effective now  Question Answer Comment  Room service appropriate? Yes   Fluid consistency: Thin      04/11/23 0739            DVT prophylaxis: SCDs Start: 04/10/23 1709   Lab Results  Component Value Date   PLT 218 04/14/2023      Code Status: Do not attempt resuscitation (DNR) - Comfort care  Family Communication: No family at bedside  Status is:  Inpatient Remains inpatient appropriate because: Awaiting DME, hospice arrangements  Level of care: Med-Surg  Consultants:  Palliative, Ortho, cardiology  Objective: Vitals:   04/14/23 1017 04/14/23 1432 04/15/23 0706 04/15/23 0708  BP: 134/65 119/65 124/65   Pulse: 99 97 99   Resp: 20 17 16    Temp: (!) 97.4 F (36.3 C) 98.2 F (36.8 C)  98.4 F (36.9 C)  TempSrc: Oral   Oral  SpO2: 92% 95% 97%   Weight:      Height:        Intake/Output Summary (Last 24 hours) at 04/16/2023 1028 Last data filed at 04/16/2023 0600 Gross per 24 hour  Intake 420 ml  Output 650 ml  Net -230 ml   Wt Readings from Last 3 Encounters:  04/10/23 54.9 kg  03/01/23 55.5 kg  01/21/23 58.1 kg    Examination:  Constitutional: NAD Eyes: no scleral icterus ENMT: Mucous membranes are moist.  Neck: normal, supple Respiratory: clear to auscultation bilaterally, no wheezing, no crackles.  Cardiovascular: Regular rate and rhythm, Abdomen: non distended, no tenderness. Bowel sounds positive.  Musculoskeletal: no clubbing / cyanosis.    Data Reviewed: I have independently reviewed following labs and imaging studies   CBC Recent Labs  Lab 04/10/23 1556 04/11/23 0336 04/13/23 0352 04/14/23 0346  WBC 9.3 6.2 8.1 8.5  HGB 9.5* 9.2* 9.1* 9.4*  HCT 30.6* 30.1* 29.3* 29.3*  PLT 197 199 193 218  MCV 101.3* 102.0* 99.0 96.7  MCH 31.5 31.2 30.7 31.0  MCHC 31.0 30.6 31.1 32.1  RDW 14.0 13.9 13.6 13.7    Recent Labs  Lab 04/10/23 1556 04/11/23 0336 04/13/23 0352 04/14/23 0346  NA 137 137 135 132*  K 3.8 4.8 5.4* 5.3*  CL 101 101 99 98  CO2 27 25 26 24   GLUCOSE 100* 104* 92 103*  BUN 97* 103* 124* 123*  CREATININE 2.56* 2.51* 3.38* 3.58*  CALCIUM  8.5* 8.5* 8.7* 8.6*  ALBUMIN  --   --  3.0* 3.1*  MG  --   --  2.7* 2.8*    ------------------------------------------------------------------------------------------------------------------ No results for input(s): "CHOL", "HDL", "LDLCALC",  "TRIG", "CHOLHDL", "LDLDIRECT" in the last 72 hours.  Lab Results  Component Value Date   HGBA1C 5.8 (H) 02/17/2023   ------------------------------------------------------------------------------------------------------------------ No results for input(s): "TSH", "T4TOTAL", "T3FREE", "THYROIDAB" in the last 72 hours.  Invalid input(s): "FREET3"  Cardiac Enzymes No results for input(s): "CKMB", "TROPONINI", "MYOGLOBIN" in the last 168 hours.  Invalid input(s): "CK" ------------------------------------------------------------------------------------------------------------------    Component Value Date/Time   BNP 1,358.0 (H) 02/20/2023 1057   BNP 521 (H) 02/03/2020 1220    CBG: No results for input(s): "GLUCAP" in the last 168 hours.  No results found for this or any previous visit (from the past 240 hours).   Radiology Studies: No results found.   Kathlen Para, MD, PhD [facility]  Between 7 am - 7 pm I am available, please contact me via Amion (for emergencies) or Securechat (non urgent messages)  Between 7 pm - 7 am I am not available, please  contact night coverage MD/APP via Amion

## 2023-04-16 NOTE — Plan of Care (Signed)
°  Problem: Elimination: Goal: Will not experience complications related to bowel motility Outcome: Progressing   Problem: Nutrition: Goal: Adequate nutrition will be maintained Outcome: Progressing   Problem: Pain Management: Goal: General experience of comfort will improve Outcome: Progressing   Problem: Safety: Goal: Ability to remain free from injury will improve Outcome: Progressing

## 2023-04-16 NOTE — Progress Notes (Signed)
 [facility] Liaison Note   Had further discussion with patient's daughter this morning. She would like additional DME including a hospital bed, overbed table, and regular wheelchair.   She also would like to work with PT in the hospital today to know how to safely move her mother.   Plan at this time for DME delivery likely this evening and discharge tomorrow with hospice nurse visit to "open" to services tomorrow at 5pm.   [facility] and attending notified.    Madelene Schanz BSN, Charity fundraiser, OCN [employer] [phone]

## 2023-04-16 NOTE — Evaluation (Signed)
 Occupational Therapy Evaluation Patient Details Name: Sara Garza MRN: [id number] DOB: [birth year] Today's Date: 04/16/2023   History of Present Illness Pt is 89 yo female  admitted on 04/10/2023 with inability to bear wt on RLE after fall.  Workup in ED revealed right periprosthetic hip fracture. ortho consulted and recomeneded nonoperative management d/t pt high risk for surgery.  PMH - CAD, CKD, DM, HTN, CHF, [name], Bil THR   Clinical Impression   Pt presents with decline in function and safety with ADLs and ADL mobility. PTA pt lived at home alone and per pt and dtr report pt was independent/mod I prior to admission, household distances with RW. Caregivers assisted with bathing. Pt has assist/supervision 50% of time currently and family working on arranging 24 hour care in addition to hospice services. Pt currently requires extensive assist with all ADLs and mobility. Once d/c home, per daughter pt will have hospital bed, w/c, Gerichair, BSC, gait belt and may need hoyer lift; home caregivers would need to provide 2 person total assist at pt current status. Pt would benefit form acute OT services for family education/trg and to maximize pt level of function and safety      If plan is discharge home, recommend the following: A lot of help with bathing/dressing/bathroom;Two people to help with walking and/or transfers;Direct supervision/assist for medications management    Functional Status Assessment  Patient has had a recent decline in their functional status and demonstrates the ability to make significant improvements in function in a reasonable and predictable amount of time.  Equipment Recommendations  Hospital bed;Wheelchair (measurements OT);Wheelchair cushion (measurements OT);Hoyer lift;BSC/3in1;Tub/shower seat    Recommendations for Other Services       Precautions / Restrictions Precautions Precautions: Fall Restrictions Weight Bearing Restrictions Per Provider Order: Yes RLE  Weight Bearing Per Provider Order: Touchdown weight bearing      Mobility Bed Mobility Overal bed mobility: Needs Assistance Bed Mobility: Supine to Sit     Supine to sit: Max assist     General bed mobility comments: Max A to elevate trunk and with LE mgt, used pads to rotate hips and scoot to EOB    Transfers Overall transfer level: Needs assistance Equipment used: Rolling walker (2 wheels) Transfers: Sit to/from Stand Sit to Stand: Max assist           General transfer comment: SPT to recliner from bed      Balance Overall balance assessment: Needs assistance, History of Falls Sitting-balance support: Feet supported, No upper extremity supported Sitting balance-Leahy Scale: Fair     Standing balance support: Bilateral upper extremity supported Standing balance-Leahy Scale: Poor                             ADL either performed or assessed with clinical judgement   ADL Overall ADL's : Needs assistance/impaired Eating/Feeding: Set up;Independent;Sitting   Grooming: Wash/dry hands;Wash/dry face;Contact guard assist;Sitting   Upper Body Bathing: Minimal assistance   Lower Body Bathing: Maximal assistance   Upper Body Dressing : Minimal assistance   Lower Body Dressing: Maximal assistance   Toilet Transfer: Maximal assistance   Toileting- Clothing Manipulation and Hygiene: Total assistance       Functional mobility during ADLs: Maximal assistance General ADL Comments: difficulty for pt to maintain TDWB     Vision Ability to See in Adequate Light: 0 Adequate Patient Visual Report: No change from baseline       Perception  Praxis         Pertinent Vitals/Pain Pain Assessment Pain Assessment: Faces Faces Pain Scale: Hurts little more Pain Location: R thigh Pain Descriptors / Indicators: Grimacing, Sore Pain Intervention(s): Monitored during session, Premedicated before session, Repositioned, Limited activity within  patient's tolerance     Extremity/Trunk Assessment Upper Extremity Assessment Upper Extremity Assessment: Generalized weakness;RUE deficits/detail RUE Deficits / Details: ltd shoulder flexion at baseline; flexion ~ 70 degrees, 70-80 degrees fwd flexion painful; AROM grossly WFL elbow, wrist hand with arthritic changes noted; strength 3/5 grossly   Lower Extremity Assessment Lower Extremity Assessment: Defer to PT evaluation   Cervical / Trunk Assessment Cervical / Trunk Assessment: Kyphotic   Communication Communication Communication: Hearing impairment   Cognition Arousal: Alert Behavior During Therapy: WFL for tasks assessed/performed Overall Cognitive Status: Within Functional Limits for tasks assessed                                       General Comments       Exercises     Shoulder Instructions      Home Living Family/patient expects to be discharged to:: Private residence Living Arrangements: Alone Available Help at Discharge: Family;Personal care attendant;Available 24 hours/day Type of Home: [place] Home Access: Level entry     Home Layout: One level     Bathroom Shower/Tub: Producer, television/film/video: Handicapped height     Home Equipment: Agricultural consultant (2 wheels);Shower seat;BSC/3in1          Prior Functioning/Environment Prior Level of Function : Needs assist       Physical Assist : ADLs (physical)     Mobility Comments: pt amb with RW mod I prior to fall; typically did not need assist with bed mobility or transfers ADLs Comments: Caregiver comes in to assist with bathing. Also has someone that comes to clean every 3 weeks. friend often provides meals and transportation to appointments        OT Problem List: Decreased strength;Impaired balance (sitting and/or standing);Decreased activity tolerance;Decreased coordination;Decreased knowledge of use of DME or AE      OT Treatment/Interventions: Self-care/ADL  training;DME and/or AE instruction;Therapeutic activities;Balance training;Patient/family education;Therapeutic exercise    OT Goals(Current goals can be found in the care plan section) Acute Rehab OT Goals Patient Stated Goal: go home OT Goal Formulation: With patient/family Time For Goal Achievement: 04/30/23 Potential to Achieve Goals: Good ADL Goals Pt Will Perform Grooming: with supervision;with set-up;sitting;with caregiver independent in assisting Pt Will Perform Upper Body Bathing: with supervision;with set-up;sitting;with caregiver independent in assisting Pt Will Perform Upper Body Dressing: with supervision;with set-up;sitting;with caregiver independent in assisting Pt Will Transfer to Toilet: with mod assist;with min assist;stand pivot transfer;bedside commode  OT Frequency: Min 1X/week    Co-evaluation              AM-PAC OT "6 Clicks" Daily Activity     Outcome Measure Help from another person eating meals?: None Help from another person taking care of personal grooming?: A Little Help from another person toileting, which includes using toliet, bedpan, or urinal?: Total Help from another person bathing (including washing, rinsing, drying)?: A Lot Help from another person to put on and taking off regular upper body clothing?: A Little Help from another person to put on and taking off regular lower body clothing?: Total 6 Click Score: 14   End of Session Equipment Utilized During Treatment: Gait  belt  Activity Tolerance: Patient limited by fatigue Patient left: in chair;with call bell/phone within reach;with family/visitor present  OT Visit Diagnosis: Unsteadiness on feet (R26.81);Other abnormalities of gait and mobility (R26.89);History of falling (Z91.81);Muscle weakness (generalized) (M62.81);Pain Pain - Right/Left: Right Pain - part of body: Hip;Leg                Time: 1610-9604 OT Time Calculation (min): 31 min Charges:  OT General Charges $OT Visit: 1  Visit OT Evaluation $OT Eval Moderate Complexity: 1 Mod OT Treatments $Self Care/Home Management : 8-22 mins $Therapeutic Activity: 8-22 mins    Alfred Ann 04/16/2023, 2:56 PM

## 2023-04-17 DIAGNOSIS — S72001D Fracture of unspecified part of neck of right femur, subsequent encounter for closed fracture with routine healing: Secondary | ICD-10-CM | POA: Diagnosis not present

## 2023-04-17 MED ORDER — FUROSEMIDE 40 MG PO TABS: 40.0000 mg | ORAL_TABLET | Freq: Every day | ORAL | Status: DC

## 2023-04-17 NOTE — Plan of Care (Signed)
  Problem: Coping: Goal: Level of anxiety will decrease Outcome: Progressing   Problem: Pain Management: Goal: General experience of comfort will improve Outcome: Progressing   Problem: Safety: Goal: Ability to remain free from injury will improve Outcome: Progressing

## 2023-04-17 NOTE — Discharge Summary (Signed)
Physician Discharge Summary  Pryce Bungert [facility] MWU:[id number] DOB: 1924/02/08 DOA: 04/10/2023  PCP: Shirline Frees, NP  Admit date: 04/10/2023 Discharge date: 04/17/2023  Admitted From: home Disposition:  home with hospice  Recommendations for Outpatient Follow-up:  Follow up with hospice RN today at 5 pm  Home Health: hospice Equipment/Devices: hospice DMEs  Discharge Condition: stable CODE STATUS: DNR Diet Orders (From admission, onward)     Start     Ordered   04/11/23 0739  Diet Heart Room service appropriate? Yes; Fluid consistency: Thin  Diet effective now       Question Answer Comment  Room service appropriate? Yes   Fluid consistency: Thin      04/11/23 0739            HPI: Per admitting MD, Sara Garza is a 89 y.o. female with medical history significant for CAD, diet-controlled diabetes, aortic stenosis status post [name], heart failure with preserved EF being admitted to the hospital with right periprosthetic hip fracture after mechanical fall today.  Patient states she was walking with her walker in her kitchen at home, where she lives alone with the assistance of home health, when she lost her grip on the walker and her legs gave out.  She did not lose consciousness, did not hit her head.  Workup as below with evidence of right periprosthetic hip fracture.  ER provider discussed with [facility] Dr. Rennis Chris who will see in consultation.   Hospital Course / Discharge diagnoses: Principal Problem:   Hip fracture (HCC)   Principal problem Right periprosthetic hip fracture - due to mechanical fall at home.  Evaluated by orthopedics as well as cardiology, she was felt to be high risk for surgery given underlying advanced cardiac disease.  Palliative care consulted, given current trajectory home hospice would be recommended and this will be set up prior to patient's discharge.  Pain seems to be controlled.     Active problems Chronic HFpEF, History of aortic stenosis  s/p [name], Severe mitral valve stenosis -TTE in 01/2023 with LVEF of 50 to 55%, G1 DD, severe LAE, severe mitral stenosis with mild to moderate MVR.  AKI on CKD-4 - Baseline Cr ~2.0.  AKI likely due to poor p.o. intake.  Creatinine plateaued at 3.5.  BUN elevated to 120s. Anemia of renal disease - H&H stable  Hypotension - Resolved.    Hyperkalemia -Due to AKI Constipation -bowel regimen Chronic nausea- improved Goal of care - DNR.  Home with hospice  Sepsis ruled out   Discharge Instructions   Allergies as of 04/17/2023       Reactions   Statins Other (See Comments)   Muscle aches   Gabapentin Other (See Comments)   SOMNOLENCE        Medication List     TAKE these medications    acetaminophen 325 MG tablet Commonly known as: TYLENOL Take 2 tablets (650 mg total) by mouth every 6 (six) hours as needed for moderate pain (pain score 4-6) or mild pain (pain score 1-3).   aspirin EC 81 MG tablet Take 81 mg by mouth daily. Swallow whole.   Calcium Carb-Cholecalciferol 500-400 MG-UNIT Tabs Take 1 tablet by mouth daily.   ezetimibe 10 MG tablet Commonly known as: ZETIA Take 1 tablet (10 mg total) by mouth daily.   furosemide 40 MG tablet Commonly known as: LASIX Take 1 tablet (40 mg total) by mouth daily. Start taking on: April 18, 2023 What changed:  how much to take how  to take this when to take this additional instructions   hydrOXYzine 10 MG tablet Commonly known as: ATARAX Take 1 tablet (10 mg total) by mouth at bedtime as needed.   metoprolol tartrate 25 MG tablet Commonly known as: LOPRESSOR Take 0.5 tablets (12.5 mg total) by mouth 2 (two) times daily.   multivitamin tablet Take 1 tablet by mouth daily.   ondansetron 4 MG tablet Commonly known as: Zofran Take 1 tablet (4 mg total) by mouth every 8 (eight) hours as needed for nausea or vomiting.   polyethylene glycol 17 g packet Commonly known as: MIRALAX / GLYCOLAX Take 17 g by mouth daily as  needed for mild constipation.   PRESERVISION AREDS 2+MULTI VIT PO Take 2 capsules by mouth in the morning and at bedtime.   traMADol 50 MG tablet Commonly known as: ULTRAM Take 0.5 tablets (25 mg total) by mouth every 6 (six) hours as needed for moderate pain (pain score 4-6).       Consultations: Orthopedic surgery Palliative  Procedures/Studies:  DG HIP UNILAT WITH PELVIS 2-3 VIEWS RIGHT Result Date: 04/10/2023 CLINICAL DATA:  Status post fall with right hip pain EXAM: DG HIP (WITH OR WITHOUT PELVIS) 3V RIGHT COMPARISON:  Pelvis radiograph dated 12/30/2019, right hip radiographs dated 07/31/2013 FINDINGS: Status post right hip arthroplasty. Hardware appears intact and well seated. There is an obliquely oriented lucency through the proximal right femoral diaphysis associated with cortical discontinuity. No acute dislocation. Postsurgical changes of left proximal femoral fixation. Old healed fracture deformity of the proximal left femur. Vascular calcifications. Degenerative changes of the partially imaged lumbar spine. IMPRESSION: Minimally displaced right proximal femoral diaphyseal periprosthetic fracture. Electronically Signed   By: Agustin Cree M.D.   On: 04/10/2023 13:36   CT Head Wo Contrast Result Date: 04/10/2023 CLINICAL DATA:  Head trauma, minor (Age >= 65y) EXAM: CT HEAD WITHOUT CONTRAST TECHNIQUE: Contiguous axial images were obtained from the base of the skull through the vertex without intravenous contrast. RADIATION DOSE REDUCTION: This exam was performed according to the departmental dose-optimization program which includes automated exposure control, adjustment of the mA and/or kV according to patient size and/or use of iterative reconstruction technique. COMPARISON:  None Available. FINDINGS: Brain: No evidence of acute infarction, hemorrhage, hydrocephalus, extra-axial collection or mass lesion/mass effect. Small bilateral cerebellar infarcts. Patchy white matter hypodensities,  nonspecific but compatible with chronic microvascular ischemic disease. Vascular: No hyperdense vessel. Skull: No acute fracture. Sinuses/Orbits: Clear sinuses.  No acute orbital findings. Other: No mastoid effusions. IMPRESSION: No evidence of acute intracranial abnormality. Electronically Signed   By: Feliberto Harts M.D.   On: 04/10/2023 13:32     Subjective: - no chest pain, shortness of breath, no abdominal pain, nausea or vomiting.   Discharge Exam: BP 107/63 (BP Location: Left Arm)   Pulse 77   Temp (!) 97.5 F (36.4 C) (Oral)   Resp 20   Ht 4\' 11"  (1.499 m)   Wt 54.9 kg   SpO2 91%   BMI 24.44 kg/m   General: Pt is alert, awake, not in acute distress Cardiovascular: RRR, S1/S2 +, no rubs, no gallops Respiratory: CTA bilaterally, no wheezing, no rhonchi Abdominal: Soft, NT, ND, bowel sounds + Extremities: no edema, no cyanosis    The results of significant diagnostics from this hospitalization (including imaging, microbiology, ancillary and laboratory) are listed below for reference.     Microbiology: No results found for this or any previous visit (from the past 240 hours).   Labs: Basic  Metabolic Panel: Recent Labs  Lab 04/10/23 1556 04/11/23 0336 04/13/23 0352 04/14/23 0346  NA 137 137 135 132*  K 3.8 4.8 5.4* 5.3*  CL 101 101 99 98  CO2 27 25 26 24   GLUCOSE 100* 104* 92 103*  BUN 97* 103* 124* 123*  CREATININE 2.56* 2.51* 3.38* 3.58*  CALCIUM 8.5* 8.5* 8.7* 8.6*  MG  --   --  2.7* 2.8*  PHOS  --   --  7.5* 7.3*   Liver Function Tests: Recent Labs  Lab 04/13/23 0352 04/14/23 0346  ALBUMIN 3.0* 3.1*   CBC: Recent Labs  Lab 04/10/23 1556 04/11/23 0336 04/13/23 0352 04/14/23 0346  WBC 9.3 6.2 8.1 8.5  HGB 9.5* 9.2* 9.1* 9.4*  HCT 30.6* 30.1* 29.3* 29.3*  MCV 101.3* 102.0* 99.0 96.7  PLT 197 199 193 218   CBG: No results for input(s): "GLUCAP" in the last 168 hours. Hgb A1c No results for input(s): "HGBA1C" in the last 72 hours. Lipid  Profile No results for input(s): "CHOL", "HDL", "LDLCALC", "TRIG", "CHOLHDL", "LDLDIRECT" in the last 72 hours. Thyroid function studies No results for input(s): "TSH", "T4TOTAL", "T3FREE", "THYROIDAB" in the last 72 hours.  Invalid input(s): "FREET3" Urinalysis    Component Value Date/Time   COLORURINE YELLOW 09/17/2016 1155   APPEARANCEUR Sl Cloudy (A) 09/17/2016 1155   LABSPEC 1.025 09/17/2016 1155   PHURINE 5.0 09/17/2016 1155   GLUCOSEU NEGATIVE 09/17/2016 1155   HGBUR NEGATIVE 09/17/2016 1155   BILIRUBINUR NEGATIVE 09/17/2016 1155   BILIRUBINUR n 06/18/2016 1016   KETONESUR 15 (A) 09/17/2016 1155   PROTEINUR n 06/18/2016 1016   PROTEINUR NEGATIVE 06/02/2016 1753   UROBILINOGEN 0.2 09/17/2016 1155   NITRITE NEGATIVE 09/17/2016 1155   LEUKOCYTESUR TRACE (A) 09/17/2016 1155    FURTHER DISCHARGE INSTRUCTIONS:   Get Medicines reviewed and adjusted: Please take all your medications with you for your next visit with your Primary MD   Laboratory/radiological data: Please request your Primary MD to go over all hospital tests and procedure/radiological results at the follow up, please ask your Primary MD to get all Hospital records sent to his/her office.   In some cases, they will be blood work, cultures and biopsy results pending at the time of your discharge. Please request that your primary care M.D. goes through all the records of your hospital data and follows up on these results.   Also Note the following: If you experience worsening of your admission symptoms, develop shortness of breath, life threatening emergency, suicidal or homicidal thoughts you must seek medical attention immediately by calling 911 or calling your MD immediately  if symptoms less severe.   You must read complete instructions/literature along with all the possible adverse reactions/side effects for all the Medicines you take and that have been prescribed to you. Take any new Medicines after you have  completely understood and accpet all the possible adverse reactions/side effects.    Do not drive when taking Pain medications or sleeping medications (Benzodaizepines)   Do not take more than prescribed Pain, Sleep and Anxiety Medications. It is not advisable to combine anxiety,sleep and pain medications without talking with your primary care practitioner   Special Instructions: If you have smoked or chewed Tobacco  in the last 2 yrs please stop smoking, stop any regular Alcohol  and or any Recreational drug use.   Wear Seat belts while driving.   Please note: You were cared for by a hospitalist during your hospital stay. Once you are discharged,  your primary care physician will handle any further medical issues. Please note that NO REFILLS for any discharge medications will be authorized once you are discharged, as it is imperative that you return to your primary care physician (or establish a relationship with a primary care physician if you do not have one) for your post hospital discharge needs so that they can reassess your need for medications and monitor your lab values.  Time coordinating discharge: 35 minutes  SIGNED:  Pamella Pert, MD, PhD 04/17/2023, 9:49 AM

## 2023-04-17 NOTE — Progress Notes (Signed)
Physical Therapy Treatment Patient Details Name: Sara Garza MRN: [id number] DOB: 1923/12/31 Today's Date: 04/17/2023   History of Present Illness Pt is 89 yo female  admitted on 04/10/2023 with inability to bear wt on RLE after fall.  Workup in ED revealed right periprosthetic hip fracture. ortho consulted and recomeneded nonoperative management d/t pt high risk for surgery.  PMH - CAD, CKD, DM, HTN, CHF, [name], Bil THR    PT Comments  Family education session -bed mobility focus as well as proper body mechanics and using hospital bed functions to ease burden of care. See below; dtr present. Pt agreeable, pleasant and cooperative however limited by pain and fatigue. Plan is for follow up education with PT/hospice   If plan is discharge home, recommend the following: Two people to help with walking and/or transfers;Two people to help with bathing/dressing/bathroom;Help with stairs or ramp for entrance;Assist for transportation;Assistance with cooking/housework   Can travel by Doctor, hospital (measurements PT);Wheelchair cushion (measurements PT);Hospital bed;Hoyer lift    Recommendations for Other Services       Precautions / Restrictions Precautions Precautions: Fall Restrictions Weight Bearing Restrictions Per Provider Order: Yes RLE Weight Bearing Per Provider Order: Touchdown weight bearing     Mobility  Bed Mobility Overal bed mobility: Needs Assistance Bed Mobility: Supine to Sit, Sit to Supine     Supine to sit: Max assist, Mod assist, HOB elevated, Used rails Sit to supine: Max assist, +2 for physical assistance, +2 for safety/equipment   General bed mobility comments: Max A to progress bil LEs off bed and  elevate trunk , used bed  pads to rotate hips and scoot to EOB; min-mod assist for rolling to L; dtr educated likely need for hoyer lift for OOB at home, educated on using bed mechanisms for proper body mechanics and to incr  pt ability to self assist/decr burden of care. dtr educated on LE support with pillow between knees for rolling and reviewed pad placement for bed change or hoyer pad placement. pt max assist of 2 to return to supine d/t fatigue,pain and weakness    Transfers Overall transfer level: Needs assistance Equipment used: Rolling walker (2 wheels) Transfers: Sit to/from Stand Sit to Stand: +2 physical assistance, Max assist           General transfer comment: assist to rise, maintain TDWB and standing position; fatigues quickly. difficulty with hip extension to come to full upright stand    Ambulation/Gait                   Stairs             Wheelchair Mobility     Tilt Bed    Modified Rankin (Stroke Patients Only)       Balance   Sitting-balance support: Feet supported, No upper extremity supported Sitting balance-Leahy Scale: Fair     Standing balance support: Bilateral upper extremity supported Standing balance-Leahy Scale: Zero                              Cognition Arousal: Alert Behavior During Therapy: WFL for tasks assessed/performed Overall Cognitive Status: Within Functional Limits for tasks assessed  Exercises      General Comments        Pertinent Vitals/Pain Pain Assessment Pain Assessment: Faces Faces Pain Scale: Hurts even more Pain Location: bil LEs with movement Pain Descriptors / Indicators: Grimacing, Sore Pain Intervention(s): Limited activity within patient's tolerance, Monitored during session, Premedicated before session, Repositioned    Home Living                          Prior Function            PT Goals (current goals can now be found in the care plan section) Acute Rehab PT Goals Patient Stated Goal: mobilize as much as able PT Goal Formulation: With patient/family Time For Goal Achievement: 04/29/23 Potential to Achieve Goals:  Fair Progress towards PT goals: Progressing toward goals    Frequency    Min 1X/week      PT Plan      Co-evaluation              AM-PAC PT "6 Clicks" Mobility   Outcome Measure  Help needed turning from your back to your side while in a flat bed without using bedrails?: Total Help needed moving from lying on your back to sitting on the side of a flat bed without using bedrails?: Total Help needed moving to and from a bed to a chair (including a wheelchair)?: Total Help needed standing up from a chair using your arms (e.g., wheelchair or bedside chair)?: Total Help needed to walk in hospital room?: Total Help needed climbing 3-5 steps with a railing? : Total 6 Click Score: 6    End of Session Equipment Utilized During Treatment: Gait belt Activity Tolerance: Patient limited by fatigue;Patient limited by pain Patient left: in bed;with call bell/phone within reach;with bed alarm set;with family/visitor present   PT Visit Diagnosis: Other abnormalities of gait and mobility (R26.89);Muscle weakness (generalized) (M62.81);History of falling (Z91.81)     Time: 1914-7829 PT Time Calculation (min) (ACUTE ONLY): 19 min  Charges:    $Therapeutic Activity: 8-22 mins PT General Charges $$ ACUTE PT VISIT: 1 Visit                     Son Barkan, PT  Acute Rehab Dept [facility]) [phone]  04/17/2023    [facility] 04/17/2023, 11:44 AM

## 2023-04-17 NOTE — Progress Notes (Signed)
Discharge packet printed and education provided to the patient and daughter. Both verbalized understanding and no concerns made.

## 2023-04-17 NOTE — TOC Transition Note (Signed)
Transition of Care [facility]) - Discharge Note   Patient Details  Name: Sara Garza MRN: [id number] Date of Birth: May 24, 1923  Transition of Care [facility] CM/SW Contact:  Amada Jupiter, LCSW Phone Number: 04/17/2023, 11:23 AM   Clinical Narrative:     Pt medically cleared for dc home today with [facility] to follow.  Pt and daughter confirm all DME ordered by [facility] has been delivered to home.  PTAR called at 11:20am.  No further TOC needs.  Final next level of care: Home w Hospice Care Barriers to Discharge: Barriers Resolved   Patient Goals and CMS Choice Patient states their goals for this hospitalization and ongoing recovery are:: return home          Discharge Placement                       Discharge Plan and Services Additional resources added to the After Visit Summary for   [place] Referral: Clinical Social Work, Hospice / Palliative Care   Post Acute Care Choice: Hospice                    HH Arranged: RN, PT [facility] Agency: [facility] ([facility]) Date [facility] Agency Contacted: 04/15/23 Time HH Agency Contacted: [phone] Representative spoke with at [facility] Agency: Glenna Fellows  Social Drivers of Health (SDOH) Interventions SDOH Screenings   Food Insecurity: No Food Insecurity (04/10/2023)  Housing: Low Risk  (04/10/2023)  Transportation Needs: No Transportation Needs (04/10/2023)  Utilities: Not At Risk (04/10/2023)  Alcohol Screen: Low Risk  (05/22/2022)  Depression (PHQ2-9): Low Risk  (05/22/2022)  Financial Resource Strain: Low Risk  (05/22/2022)  Physical Activity: Inactive (05/22/2022)  Social Connections: Moderately Integrated (04/10/2023)  Stress: No Stress Concern Present (05/22/2022)  Tobacco Use: Medium Risk (04/10/2023)     Readmission Risk Interventions    04/15/2023   11:06 AM  Readmission Risk Prevention Plan  Transportation Screening Complete  PCP or Specialist Appt within 3-5 Days Complete  HRI or Home Care Consult  Complete  Social Work Consult for Recovery Care Planning/Counseling Complete  Palliative Care Screening Complete  Medication Review [employer]) Complete

## 2023-04-17 NOTE — Plan of Care (Signed)
  Problem: Pain Management: Goal: Pain level will decrease Outcome: Progressing   

## 2023-04-17 NOTE — Progress Notes (Signed)
Pt transported by PTAR via strecher at 1220 to home. Pt had no c/o pain and O2 on 3.5 L via nasal canula in place. Daughter received the belongings.

## 2023-04-22 ENCOUNTER — Encounter: Payer: Self-pay | Admitting: Cardiovascular Disease

## 2023-04-22 ENCOUNTER — Encounter: Payer: Self-pay | Admitting: Adult Health

## 2023-04-23 NOTE — Telephone Encounter (Signed)
-----   Message from [facility] Lelon Huh sent at 04/22/2023  9:36 AM EST -----  ----- Message ----- From: Sydnee Cabal Sent: 04/22/2023   8:27 AM EST To: Anijah Sp[facility]  Please abstract and route to provider.

## 2023-04-23 NOTE — Telephone Encounter (Signed)
Spoke to patients daughter and gave my condolences on patient passing.

## 2023-05-03 DEATH — deceased
